# Patient Record
Sex: Female | Born: 1955 | Race: Black or African American | Hispanic: No | Marital: Married | Smoking: Never smoker
Health system: Southern US, Community
[De-identification: ages and names within clinical notes are randomized; demographics above are authoritative.]

## PROBLEM LIST (undated history)

## (undated) DIAGNOSIS — I10 Essential (primary) hypertension: Secondary | ICD-10-CM

## (undated) DIAGNOSIS — I4891 Unspecified atrial fibrillation: Secondary | ICD-10-CM

## (undated) DIAGNOSIS — R0600 Dyspnea, unspecified: Secondary | ICD-10-CM

## (undated) DIAGNOSIS — N189 Chronic kidney disease, unspecified: Secondary | ICD-10-CM

## (undated) DIAGNOSIS — U071 COVID-19: Secondary | ICD-10-CM

## (undated) DIAGNOSIS — D649 Anemia, unspecified: Secondary | ICD-10-CM

## (undated) DIAGNOSIS — M1990 Unspecified osteoarthritis, unspecified site: Secondary | ICD-10-CM

## (undated) DIAGNOSIS — G4730 Sleep apnea, unspecified: Secondary | ICD-10-CM

## (undated) DIAGNOSIS — J189 Pneumonia, unspecified organism: Secondary | ICD-10-CM

## (undated) DIAGNOSIS — Z9981 Dependence on supplemental oxygen: Secondary | ICD-10-CM

## (undated) DIAGNOSIS — I5030 Unspecified diastolic (congestive) heart failure: Secondary | ICD-10-CM

## (undated) DIAGNOSIS — D696 Thrombocytopenia, unspecified: Secondary | ICD-10-CM

## (undated) DIAGNOSIS — J9611 Chronic respiratory failure with hypoxia: Secondary | ICD-10-CM

## (undated) DIAGNOSIS — E119 Type 2 diabetes mellitus without complications: Secondary | ICD-10-CM

## (undated) DIAGNOSIS — K922 Gastrointestinal hemorrhage, unspecified: Secondary | ICD-10-CM

## (undated) DIAGNOSIS — M109 Gout, unspecified: Secondary | ICD-10-CM

## (undated) DIAGNOSIS — N183 Chronic kidney disease, stage 3 unspecified: Secondary | ICD-10-CM

## (undated) DIAGNOSIS — C801 Malignant (primary) neoplasm, unspecified: Secondary | ICD-10-CM

## (undated) DIAGNOSIS — Z9989 Dependence on other enabling machines and devices: Secondary | ICD-10-CM

---

## 1998-04-06 ENCOUNTER — Other Ambulatory Visit: Admission: RE | Admit: 1998-04-06 | Discharge: 1998-04-06 | Payer: Self-pay | Admitting: Obstetrics and Gynecology

## 2000-07-07 ENCOUNTER — Encounter: Admission: RE | Admit: 2000-07-07 | Discharge: 2000-07-07 | Payer: Self-pay | Admitting: Obstetrics and Gynecology

## 2000-07-07 ENCOUNTER — Encounter: Payer: Self-pay | Admitting: Obstetrics and Gynecology

## 2000-07-07 ENCOUNTER — Encounter: Payer: Self-pay | Admitting: Oncology

## 2000-07-07 ENCOUNTER — Encounter: Admission: RE | Admit: 2000-07-07 | Discharge: 2000-07-07 | Payer: Self-pay | Admitting: Oncology

## 2001-06-25 ENCOUNTER — Ambulatory Visit (HOSPITAL_COMMUNITY): Admission: RE | Admit: 2001-06-25 | Discharge: 2001-06-25 | Payer: Self-pay | Admitting: Obstetrics and Gynecology

## 2001-06-25 ENCOUNTER — Encounter: Payer: Self-pay | Admitting: Obstetrics and Gynecology

## 2001-06-25 ENCOUNTER — Encounter (INDEPENDENT_AMBULATORY_CARE_PROVIDER_SITE_OTHER): Payer: Self-pay | Admitting: *Deleted

## 2001-07-09 ENCOUNTER — Encounter: Admission: RE | Admit: 2001-07-09 | Discharge: 2001-07-09 | Payer: Self-pay | Admitting: Oncology

## 2001-07-09 ENCOUNTER — Encounter: Payer: Self-pay | Admitting: Oncology

## 2002-07-12 ENCOUNTER — Encounter: Payer: Self-pay | Admitting: Oncology

## 2002-07-12 ENCOUNTER — Encounter: Admission: RE | Admit: 2002-07-12 | Discharge: 2002-07-12 | Payer: Self-pay | Admitting: Oncology

## 2003-04-08 ENCOUNTER — Ambulatory Visit (HOSPITAL_COMMUNITY): Admission: RE | Admit: 2003-04-08 | Discharge: 2003-04-08 | Payer: Self-pay | Admitting: Gastroenterology

## 2003-04-08 ENCOUNTER — Encounter (INDEPENDENT_AMBULATORY_CARE_PROVIDER_SITE_OTHER): Payer: Self-pay | Admitting: Specialist

## 2003-07-15 ENCOUNTER — Encounter: Payer: Self-pay | Admitting: Oncology

## 2003-07-15 ENCOUNTER — Encounter: Admission: RE | Admit: 2003-07-15 | Discharge: 2003-07-15 | Payer: Self-pay | Admitting: Oncology

## 2004-07-16 ENCOUNTER — Encounter: Admission: RE | Admit: 2004-07-16 | Discharge: 2004-07-16 | Payer: Self-pay | Admitting: Obstetrics and Gynecology

## 2005-07-18 ENCOUNTER — Encounter: Admission: RE | Admit: 2005-07-18 | Discharge: 2005-07-18 | Payer: Self-pay | Admitting: Internal Medicine

## 2005-07-23 ENCOUNTER — Encounter: Admission: RE | Admit: 2005-07-23 | Discharge: 2005-07-23 | Payer: Self-pay | Admitting: Internal Medicine

## 2006-07-24 ENCOUNTER — Encounter: Admission: RE | Admit: 2006-07-24 | Discharge: 2006-07-24 | Payer: Self-pay | Admitting: Internal Medicine

## 2007-07-27 ENCOUNTER — Encounter: Admission: RE | Admit: 2007-07-27 | Discharge: 2007-07-27 | Payer: Self-pay | Admitting: Internal Medicine

## 2008-07-29 ENCOUNTER — Encounter: Admission: RE | Admit: 2008-07-29 | Discharge: 2008-07-29 | Payer: Self-pay | Admitting: Internal Medicine

## 2009-07-31 ENCOUNTER — Encounter: Admission: RE | Admit: 2009-07-31 | Discharge: 2009-07-31 | Payer: Self-pay | Admitting: Internal Medicine

## 2010-08-01 ENCOUNTER — Encounter: Admission: RE | Admit: 2010-08-01 | Discharge: 2010-08-01 | Payer: Self-pay | Admitting: Internal Medicine

## 2011-04-26 NOTE — Op Note (Signed)
[facility]  Patient:    Rita [name], Rita [name]                  MRN: [id number] Proc. Date: 06/25/01 Adm. Date:  [id number] Attending:  Aram Beecham                           Operative Report  PREOPERATIVE DIAGNOSIS:  Endometrial polyp.  POSTOPERATIVE DIAGNOSIS:  Build up of endometrium.  OPERATION:  Hysteroscopy with endometrial sampling.  SURGEON:  Daniel L. Cherylann Banas, M.D.  ANESTHESIA:  General.  INDICATIONS:  The patient is a 55 year old, gravida 4, para 4, ab 0, who is postmenopausal, status post treatment for breast cancer, who did not have any estrogen replacement therapy.  She was ultrasounded to evaluate her pelvis and at that time, an endometrial polyp was found.  She enters the hospital now for hysteroscopy and excision of the above.  FINDINGS:  External and vaginal is within normal limits.  Cervix is clean. Uterus appears to be slightly enlarged.  It is a difficult exam because of the patients obesity.  No adnexal masses are appreciated.  The intrauterine cavity is enlarged and sounds to 4.25 cm.  At the time of hysteroscopy, the patient did not have a well-defined polyp, but there was a significant build up of endometrium which was not appropriate based on the above findings. There was less than first-degree uterine descensus.  DESCRIPTION OF PROCEDURE:  After adequate general endotracheal anesthesia, the patient was placed in the dorsal lithotomy position, prepped and draped in the usual sterile manner.  A single-tooth tenaculum was placed in the anterior lip of the cervix, and the cervix was dilated to a #35 Pratt dilator.  A hysteroscopic examination was then done using 3% sorbitol to expand the intrauterine cavity, using a camera for magnification.  The above findings were noted.  Using the wire loop, areas of endometrial build-up were scraped away from the myometrium; then a very vigorous sharp curettage was  done throughout, and then the patient was rehysteroscoped to be sure that adequate sampling had been obtained, and it was.  All of the above tissue was sent. One of the areas looked polypoid, and this was sent separately.  Blood loss was less than 50 cc.  Fluid deficit was 200 cc.  The patient tolerated the procedure well and left the operating room in satisfactory condition. DD:  06/25/01 TD:  06/26/01 Job: [id number]

## 2011-04-26 NOTE — Op Note (Signed)
   NAME:  Rita [name], Rita [name]                     ACCOUNT NO.:  [id number]   MEDICAL RECORD NO.:  [id number]                   PATIENT TYPE:  AMB   LOCATION:  ENDO                                 FACILITY:  [facility]   PHYSICIAN:  Jeryl Columbia, M.D.                 DATE OF BIRTH:  [birth year]   DATE OF PROCEDURE:  04/08/2003  DATE OF DISCHARGE:                                 OPERATIVE REPORT   PROCEDURE:  Colonoscopy with polypectomy.   INDICATION:  Family history of colon cancer.  Personal history of colon  polyps.  Due for a repeat screening.  Consent was signed after risks,  benefits, methods, options thoroughly discussed multiple times in the past.   MEDICINES USED:  1. Demerol 70.  2. Versed 8.   DESCRIPTION OF PROCEDURE:  Rectal inspection was pertinent for external  hemorrhoids, small.  Digital exam was negative.  The regular video  colonoscope was inserted, easily advanced around the colon to the cecum.  This did require some abdominal pressure but no position changes.  No  obvious abnormality was seen on insertion.  The cecum was identified by the  appendiceal orifice and the ileocecal valve.  The scope was slowly  withdrawn.  The prep was adequate.  There was some liquid stool that  required washing and suctioning.  On slow withdrawal through the colon, in  the hepatic flexure along a fold, questionable tiny polyp was seen and was  hot biopsied x 1.  Other than that, no abnormalities were seen as we slowly  withdrew back to the rectum.  Specifically, no other polyps, masses,  diverticula.  Once back in the rectum, the scope was retroflexed, pertinet  for some internal hemorrhoids.  The scope was straightened and readvanced a  short ways up the left side of the colon; air was suctioned and scope  removed.  The patient tolerated the procedure well.  There was no obvious  immediate complication.   ENDOSCOPIC DIAGNOSES:  1. Internal-external hemorrhoids, small.  2. Tiny  hepatic flexure polyp, hot biopsied.  3. Otherwise, within normal limits to the cecum.    PLAN:  1. Yearly rectals and guaiacs per Dr. Bea Graff.  2. Happy to see back p.r.n.  3. Pending pathology, probably recheck colon screening in five years.                                               Jeryl Columbia, M.D.    MEM/MEDQ  D:  04/08/2003  T:  04/08/2003  Job:  [id number]   cc:   Dr. Wilmon Arms Granjeno, Clark Beryle Beams, M.D.  [address]. [place] [address]  Fax: [phone]

## 2011-06-27 ENCOUNTER — Other Ambulatory Visit: Payer: Self-pay | Admitting: Internal Medicine

## 2011-06-27 DIAGNOSIS — Z1231 Encounter for screening mammogram for malignant neoplasm of breast: Secondary | ICD-10-CM

## 2011-08-05 ENCOUNTER — Ambulatory Visit
Admission: RE | Admit: 2011-08-05 | Discharge: 2011-08-05 | Disposition: A | Discharge status: Home / Self Care | Payer: BC Managed Care – PPO | Source: Ambulatory Visit | Attending: Internal Medicine | Admitting: Internal Medicine

## 2011-08-05 DIAGNOSIS — Z1231 Encounter for screening mammogram for malignant neoplasm of breast: Secondary | ICD-10-CM

## 2012-08-04 ENCOUNTER — Other Ambulatory Visit: Payer: Self-pay | Admitting: Internal Medicine

## 2012-08-04 DIAGNOSIS — Z1231 Encounter for screening mammogram for malignant neoplasm of breast: Secondary | ICD-10-CM

## 2012-08-20 ENCOUNTER — Ambulatory Visit
Admission: RE | Admit: 2012-08-20 | Discharge: 2012-08-20 | Disposition: A | Discharge status: Home / Self Care | Payer: BC Managed Care – PPO | Source: Ambulatory Visit | Attending: Internal Medicine | Admitting: Internal Medicine

## 2012-08-20 DIAGNOSIS — Z1231 Encounter for screening mammogram for malignant neoplasm of breast: Secondary | ICD-10-CM

## 2013-08-31 ENCOUNTER — Other Ambulatory Visit: Payer: Self-pay

## 2013-08-31 DIAGNOSIS — Z9889 Other specified postprocedural states: Secondary | ICD-10-CM

## 2013-08-31 DIAGNOSIS — Z853 Personal history of malignant neoplasm of breast: Secondary | ICD-10-CM

## 2013-08-31 DIAGNOSIS — Z1231 Encounter for screening mammogram for malignant neoplasm of breast: Secondary | ICD-10-CM

## 2013-09-13 ENCOUNTER — Ambulatory Visit
Admission: RE | Admit: 2013-09-13 | Discharge: 2013-09-13 | Disposition: A | Discharge status: Home / Self Care | Payer: BC Managed Care – PPO | Source: Ambulatory Visit

## 2013-09-13 DIAGNOSIS — Z853 Personal history of malignant neoplasm of breast: Secondary | ICD-10-CM

## 2013-09-13 DIAGNOSIS — Z9889 Other specified postprocedural states: Secondary | ICD-10-CM

## 2013-09-13 DIAGNOSIS — Z1231 Encounter for screening mammogram for malignant neoplasm of breast: Secondary | ICD-10-CM

## 2014-08-10 ENCOUNTER — Other Ambulatory Visit: Payer: Self-pay

## 2014-08-10 DIAGNOSIS — Z1231 Encounter for screening mammogram for malignant neoplasm of breast: Secondary | ICD-10-CM

## 2014-09-15 ENCOUNTER — Ambulatory Visit
Admission: RE | Admit: 2014-09-15 | Discharge: 2014-09-15 | Disposition: A | Discharge status: Home / Self Care | Payer: 59 | Source: Ambulatory Visit

## 2014-09-15 DIAGNOSIS — Z1231 Encounter for screening mammogram for malignant neoplasm of breast: Secondary | ICD-10-CM

## 2014-12-29 ENCOUNTER — Other Ambulatory Visit: Payer: Self-pay | Admitting: Specialist

## 2014-12-29 DIAGNOSIS — N644 Mastodynia: Secondary | ICD-10-CM

## 2015-01-06 ENCOUNTER — Ambulatory Visit
Admission: RE | Admit: 2015-01-06 | Discharge: 2015-01-06 | Disposition: A | Discharge status: Home / Self Care | Payer: No Typology Code available for payment source | Source: Ambulatory Visit | Attending: Specialist | Admitting: Specialist

## 2015-01-06 DIAGNOSIS — N644 Mastodynia: Secondary | ICD-10-CM

## 2015-11-06 ENCOUNTER — Other Ambulatory Visit: Payer: Self-pay

## 2015-11-06 DIAGNOSIS — Z853 Personal history of malignant neoplasm of breast: Secondary | ICD-10-CM

## 2015-11-06 DIAGNOSIS — Z1231 Encounter for screening mammogram for malignant neoplasm of breast: Secondary | ICD-10-CM

## 2015-12-12 ENCOUNTER — Ambulatory Visit
Admission: RE | Admit: 2015-12-12 | Discharge: 2015-12-12 | Disposition: A | Discharge status: Home / Self Care | Payer: Self-pay | Source: Ambulatory Visit

## 2015-12-12 ENCOUNTER — Ambulatory Visit: Payer: Self-pay

## 2015-12-12 DIAGNOSIS — Z1231 Encounter for screening mammogram for malignant neoplasm of breast: Secondary | ICD-10-CM

## 2015-12-12 DIAGNOSIS — Z853 Personal history of malignant neoplasm of breast: Secondary | ICD-10-CM

## 2016-08-08 DIAGNOSIS — E119 Type 2 diabetes mellitus without complications: Secondary | ICD-10-CM

## 2016-08-08 DIAGNOSIS — I4891 Unspecified atrial fibrillation: Secondary | ICD-10-CM

## 2016-08-08 DIAGNOSIS — I10 Essential (primary) hypertension: Secondary | ICD-10-CM

## 2016-10-29 DIAGNOSIS — I482 Chronic atrial fibrillation: Secondary | ICD-10-CM

## 2016-10-29 DIAGNOSIS — J181 Lobar pneumonia, unspecified organism: Secondary | ICD-10-CM

## 2016-10-29 DIAGNOSIS — I10 Essential (primary) hypertension: Secondary | ICD-10-CM

## 2016-10-29 DIAGNOSIS — J90 Pleural effusion, not elsewhere classified: Secondary | ICD-10-CM

## 2016-10-29 DIAGNOSIS — I5033 Acute on chronic diastolic (congestive) heart failure: Secondary | ICD-10-CM

## 2016-10-29 DIAGNOSIS — E119 Type 2 diabetes mellitus without complications: Secondary | ICD-10-CM

## 2016-10-30 DIAGNOSIS — J9601 Acute respiratory failure with hypoxia: Secondary | ICD-10-CM

## 2016-10-31 DIAGNOSIS — J181 Lobar pneumonia, unspecified organism: Secondary | ICD-10-CM

## 2016-10-31 DIAGNOSIS — I5033 Acute on chronic diastolic (congestive) heart failure: Secondary | ICD-10-CM

## 2016-10-31 DIAGNOSIS — I482 Chronic atrial fibrillation: Secondary | ICD-10-CM

## 2017-11-26 DIAGNOSIS — I10 Essential (primary) hypertension: Secondary | ICD-10-CM

## 2017-11-26 DIAGNOSIS — E119 Type 2 diabetes mellitus without complications: Secondary | ICD-10-CM

## 2017-11-26 DIAGNOSIS — I5033 Acute on chronic diastolic (congestive) heart failure: Secondary | ICD-10-CM

## 2017-11-26 DIAGNOSIS — I509 Heart failure, unspecified: Secondary | ICD-10-CM

## 2017-11-26 DIAGNOSIS — R0609 Other forms of dyspnea: Secondary | ICD-10-CM

## 2017-11-26 DIAGNOSIS — I482 Chronic atrial fibrillation: Secondary | ICD-10-CM

## 2017-11-27 DIAGNOSIS — I472 Ventricular tachycardia: Secondary | ICD-10-CM

## 2018-11-16 DIAGNOSIS — E1122 Type 2 diabetes mellitus with diabetic chronic kidney disease: Secondary | ICD-10-CM | POA: Diagnosis not present

## 2018-11-16 DIAGNOSIS — I129 Hypertensive chronic kidney disease with stage 1 through stage 4 chronic kidney disease, or unspecified chronic kidney disease: Secondary | ICD-10-CM | POA: Diagnosis not present

## 2018-11-16 DIAGNOSIS — Z23 Encounter for immunization: Secondary | ICD-10-CM | POA: Diagnosis not present

## 2018-11-16 DIAGNOSIS — I5022 Chronic systolic (congestive) heart failure: Secondary | ICD-10-CM | POA: Diagnosis not present

## 2018-11-16 DIAGNOSIS — I272 Pulmonary hypertension, unspecified: Secondary | ICD-10-CM | POA: Diagnosis not present

## 2018-11-16 DIAGNOSIS — I4819 Other persistent atrial fibrillation: Secondary | ICD-10-CM | POA: Diagnosis not present

## 2018-11-16 DIAGNOSIS — D631 Anemia in chronic kidney disease: Secondary | ICD-10-CM | POA: Diagnosis not present

## 2018-11-16 DIAGNOSIS — I130 Hypertensive heart and chronic kidney disease with heart failure and stage 1 through stage 4 chronic kidney disease, or unspecified chronic kidney disease: Secondary | ICD-10-CM | POA: Diagnosis not present

## 2018-11-16 DIAGNOSIS — N183 Chronic kidney disease, stage 3 (moderate): Secondary | ICD-10-CM | POA: Diagnosis not present

## 2018-11-16 DIAGNOSIS — M150 Primary generalized (osteo)arthritis: Secondary | ICD-10-CM | POA: Diagnosis not present

## 2018-11-16 DIAGNOSIS — E782 Mixed hyperlipidemia: Secondary | ICD-10-CM | POA: Diagnosis not present

## 2018-11-16 DIAGNOSIS — M1A00X0 Idiopathic chronic gout, unspecified site, without tophus (tophi): Secondary | ICD-10-CM | POA: Diagnosis not present

## 2018-12-17 DIAGNOSIS — M170 Bilateral primary osteoarthritis of knee: Secondary | ICD-10-CM | POA: Diagnosis not present

## 2019-01-13 DIAGNOSIS — R112 Nausea with vomiting, unspecified: Secondary | ICD-10-CM | POA: Diagnosis not present

## 2019-01-13 DIAGNOSIS — R1030 Lower abdominal pain, unspecified: Secondary | ICD-10-CM | POA: Diagnosis not present

## 2019-01-14 DIAGNOSIS — R1030 Lower abdominal pain, unspecified: Secondary | ICD-10-CM | POA: Diagnosis not present

## 2019-01-14 DIAGNOSIS — K449 Diaphragmatic hernia without obstruction or gangrene: Secondary | ICD-10-CM | POA: Diagnosis not present

## 2019-01-14 DIAGNOSIS — K429 Umbilical hernia without obstruction or gangrene: Secondary | ICD-10-CM | POA: Diagnosis not present

## 2019-01-18 DIAGNOSIS — E119 Type 2 diabetes mellitus without complications: Secondary | ICD-10-CM | POA: Diagnosis not present

## 2019-01-18 DIAGNOSIS — Z01818 Encounter for other preprocedural examination: Secondary | ICD-10-CM | POA: Diagnosis not present

## 2019-01-18 DIAGNOSIS — H268 Other specified cataract: Secondary | ICD-10-CM | POA: Diagnosis not present

## 2019-01-18 DIAGNOSIS — H25811 Combined forms of age-related cataract, right eye: Secondary | ICD-10-CM | POA: Diagnosis not present

## 2019-01-21 DIAGNOSIS — K429 Umbilical hernia without obstruction or gangrene: Secondary | ICD-10-CM | POA: Diagnosis not present

## 2019-01-29 DIAGNOSIS — D500 Iron deficiency anemia secondary to blood loss (chronic): Secondary | ICD-10-CM | POA: Diagnosis not present

## 2019-02-02 DIAGNOSIS — Z1212 Encounter for screening for malignant neoplasm of rectum: Secondary | ICD-10-CM | POA: Diagnosis not present

## 2019-02-02 DIAGNOSIS — R195 Other fecal abnormalities: Secondary | ICD-10-CM | POA: Diagnosis not present

## 2019-02-02 DIAGNOSIS — D500 Iron deficiency anemia secondary to blood loss (chronic): Secondary | ICD-10-CM | POA: Diagnosis not present

## 2019-02-02 DIAGNOSIS — K2901 Acute gastritis with bleeding: Secondary | ICD-10-CM | POA: Diagnosis not present

## 2019-02-07 HISTORY — PX: HERNIA REPAIR: SHX51

## 2019-02-08 DIAGNOSIS — Z79899 Other long term (current) drug therapy: Secondary | ICD-10-CM | POA: Diagnosis not present

## 2019-02-08 DIAGNOSIS — D649 Anemia, unspecified: Secondary | ICD-10-CM | POA: Diagnosis not present

## 2019-02-08 DIAGNOSIS — Z01810 Encounter for preprocedural cardiovascular examination: Secondary | ICD-10-CM | POA: Diagnosis not present

## 2019-02-08 DIAGNOSIS — Z7984 Long term (current) use of oral hypoglycemic drugs: Secondary | ICD-10-CM | POA: Diagnosis not present

## 2019-02-08 DIAGNOSIS — E119 Type 2 diabetes mellitus without complications: Secondary | ICD-10-CM | POA: Diagnosis not present

## 2019-02-08 DIAGNOSIS — Z86711 Personal history of pulmonary embolism: Secondary | ICD-10-CM | POA: Diagnosis not present

## 2019-02-08 DIAGNOSIS — I509 Heart failure, unspecified: Secondary | ICD-10-CM | POA: Diagnosis not present

## 2019-02-08 DIAGNOSIS — Z7901 Long term (current) use of anticoagulants: Secondary | ICD-10-CM | POA: Diagnosis not present

## 2019-02-08 DIAGNOSIS — G8918 Other acute postprocedural pain: Secondary | ICD-10-CM | POA: Diagnosis not present

## 2019-02-08 DIAGNOSIS — K429 Umbilical hernia without obstruction or gangrene: Secondary | ICD-10-CM | POA: Diagnosis not present

## 2019-02-08 DIAGNOSIS — I110 Hypertensive heart disease with heart failure: Secondary | ICD-10-CM | POA: Diagnosis not present

## 2019-02-08 DIAGNOSIS — Z01818 Encounter for other preprocedural examination: Secondary | ICD-10-CM

## 2019-02-08 DIAGNOSIS — J449 Chronic obstructive pulmonary disease, unspecified: Secondary | ICD-10-CM | POA: Diagnosis not present

## 2019-02-11 ENCOUNTER — Other Ambulatory Visit: Payer: Self-pay | Admitting: *Deleted

## 2019-02-11 NOTE — Patient Outreach (Signed)
[facility]) Care Management  02/11/2019  Rita [name] December 20, 1955 [id number]   Transition of Care Referral   Referral Date: 02/10/19 Referral Source: HTA IP discharge Date of Admission: 05/12/32 Diagnosis: umbilical hernia  Date of Discharge: Facility: [facility] on 02/09/19 Insurance: HTA   Outreach attempt # 1 Patient is able to verify HIPAA Reviewed and addressed Transitional of care referral with patient   Rita [name] reports she is feeling "better" She reports she has good support from her husband. He is "waiting on me hand and foot"  She reports he assists her to get up to walk from her bedroom to her kitchen twice a day She denies bleeding, swelling, drainage or redness  from incision site No home health was ordered  Told to take  stool softener and she reports having a good stool since d/x Rita [name] She reports " I had good nurses at [facility] and I appreciate everyone for their care"  Social: Rita [name] lives at home with her husband and is assist with all her care needs at this time She reports lots of support and visits from family, friends, her pastor and church members She denies concern with transportation to her medical appointments   Conditions HTN , Controlled DM type 2 :HDL, morbid obesity, atrial fibrillation, osteoarthritis of multiple joints pulmonary HTN, umbilical hernia, venous insufficiency,Anemia due to stage 3 CKD, chronic gout, CHF, GERD  Medications: She denies concerns with taking medications as prescribed, affording medications, side effects of medications and questions about medications does not get flu shots   Appointments: Has a f/.u with Dr  Corena Pilgrim on 02/17/19   Advance directives: She Denies need for assist with advance directives  Denies need for assist with changes to present advance directives    Consent: [facility] RN CM reviewed [facility] services with patient. Patient gave verbal consent for services. Advised patient that other  post discharge calls may occur to assess how the patient is doing following the recent hospitalization. Patient voiced understanding and was appreciative of f/u call.  Plan: [facility] RN CM will close case at this time as patient has been assessed and no needs identified/needs resolved.   Pt encouraged to return a call to [place] CM prn  [facility] RN CM sent a successful outreach letter as discussed with [facility] brochure enclosed for review   Kaheem Halleck L. Lavina Hamman, RN, BSN, [place] Management Care Coordinator Direct Number [phone] Mobile number [phone]  Main [facility] number [phone] Fax number [phone]

## 2019-02-18 DIAGNOSIS — Z6841 Body Mass Index (BMI) 40.0 and over, adult: Secondary | ICD-10-CM | POA: Diagnosis not present

## 2019-02-18 DIAGNOSIS — I5022 Chronic systolic (congestive) heart failure: Secondary | ICD-10-CM | POA: Diagnosis not present

## 2019-02-18 DIAGNOSIS — Z7901 Long term (current) use of anticoagulants: Secondary | ICD-10-CM | POA: Diagnosis not present

## 2019-02-18 DIAGNOSIS — Z7984 Long term (current) use of oral hypoglycemic drugs: Secondary | ICD-10-CM | POA: Diagnosis not present

## 2019-02-18 DIAGNOSIS — I130 Hypertensive heart and chronic kidney disease with heart failure and stage 1 through stage 4 chronic kidney disease, or unspecified chronic kidney disease: Secondary | ICD-10-CM | POA: Diagnosis not present

## 2019-02-18 DIAGNOSIS — E1122 Type 2 diabetes mellitus with diabetic chronic kidney disease: Secondary | ICD-10-CM | POA: Diagnosis not present

## 2019-02-18 DIAGNOSIS — N183 Chronic kidney disease, stage 3 (moderate): Secondary | ICD-10-CM | POA: Diagnosis not present

## 2019-04-12 DIAGNOSIS — D500 Iron deficiency anemia secondary to blood loss (chronic): Secondary | ICD-10-CM | POA: Diagnosis not present

## 2019-04-28 DIAGNOSIS — D500 Iron deficiency anemia secondary to blood loss (chronic): Secondary | ICD-10-CM | POA: Diagnosis not present

## 2019-05-06 DIAGNOSIS — H268 Other specified cataract: Secondary | ICD-10-CM | POA: Diagnosis not present

## 2019-05-06 DIAGNOSIS — H2512 Age-related nuclear cataract, left eye: Secondary | ICD-10-CM | POA: Diagnosis not present

## 2019-05-06 DIAGNOSIS — H25812 Combined forms of age-related cataract, left eye: Secondary | ICD-10-CM | POA: Diagnosis not present

## 2019-05-17 DIAGNOSIS — H25812 Combined forms of age-related cataract, left eye: Secondary | ICD-10-CM | POA: Diagnosis not present

## 2019-05-18 DIAGNOSIS — Z23 Encounter for immunization: Secondary | ICD-10-CM | POA: Diagnosis not present

## 2019-05-18 DIAGNOSIS — N183 Chronic kidney disease, stage 3 (moderate): Secondary | ICD-10-CM | POA: Diagnosis not present

## 2019-05-18 DIAGNOSIS — I129 Hypertensive chronic kidney disease with stage 1 through stage 4 chronic kidney disease, or unspecified chronic kidney disease: Secondary | ICD-10-CM | POA: Diagnosis not present

## 2019-05-18 DIAGNOSIS — I272 Pulmonary hypertension, unspecified: Secondary | ICD-10-CM | POA: Diagnosis not present

## 2019-05-18 DIAGNOSIS — Z7901 Long term (current) use of anticoagulants: Secondary | ICD-10-CM | POA: Diagnosis not present

## 2019-05-18 DIAGNOSIS — D631 Anemia in chronic kidney disease: Secondary | ICD-10-CM | POA: Diagnosis not present

## 2019-05-18 DIAGNOSIS — I4819 Other persistent atrial fibrillation: Secondary | ICD-10-CM | POA: Diagnosis not present

## 2019-05-18 DIAGNOSIS — I110 Hypertensive heart disease with heart failure: Secondary | ICD-10-CM | POA: Diagnosis not present

## 2019-05-18 DIAGNOSIS — E1122 Type 2 diabetes mellitus with diabetic chronic kidney disease: Secondary | ICD-10-CM | POA: Diagnosis not present

## 2019-05-18 DIAGNOSIS — Z0000 Encounter for general adult medical examination without abnormal findings: Secondary | ICD-10-CM | POA: Diagnosis not present

## 2019-05-18 DIAGNOSIS — J9611 Chronic respiratory failure with hypoxia: Secondary | ICD-10-CM | POA: Diagnosis not present

## 2019-05-18 DIAGNOSIS — Z6841 Body Mass Index (BMI) 40.0 and over, adult: Secondary | ICD-10-CM | POA: Diagnosis not present

## 2019-05-18 DIAGNOSIS — I5022 Chronic systolic (congestive) heart failure: Secondary | ICD-10-CM | POA: Diagnosis not present

## 2019-05-24 DIAGNOSIS — D500 Iron deficiency anemia secondary to blood loss (chronic): Secondary | ICD-10-CM | POA: Diagnosis not present

## 2019-05-27 DIAGNOSIS — D500 Iron deficiency anemia secondary to blood loss (chronic): Secondary | ICD-10-CM | POA: Diagnosis not present

## 2019-05-31 DIAGNOSIS — Z1231 Encounter for screening mammogram for malignant neoplasm of breast: Secondary | ICD-10-CM | POA: Diagnosis not present

## 2019-06-08 DIAGNOSIS — I4819 Other persistent atrial fibrillation: Secondary | ICD-10-CM | POA: Diagnosis not present

## 2019-06-08 DIAGNOSIS — Z6841 Body Mass Index (BMI) 40.0 and over, adult: Secondary | ICD-10-CM | POA: Diagnosis not present

## 2019-06-08 DIAGNOSIS — I872 Venous insufficiency (chronic) (peripheral): Secondary | ICD-10-CM | POA: Diagnosis not present

## 2019-06-08 DIAGNOSIS — Z7901 Long term (current) use of anticoagulants: Secondary | ICD-10-CM | POA: Diagnosis not present

## 2019-06-08 DIAGNOSIS — I10 Essential (primary) hypertension: Secondary | ICD-10-CM | POA: Diagnosis not present

## 2019-06-08 DIAGNOSIS — I272 Pulmonary hypertension, unspecified: Secondary | ICD-10-CM | POA: Diagnosis not present

## 2019-06-08 DIAGNOSIS — E782 Mixed hyperlipidemia: Secondary | ICD-10-CM | POA: Diagnosis not present

## 2019-06-09 DIAGNOSIS — D500 Iron deficiency anemia secondary to blood loss (chronic): Secondary | ICD-10-CM | POA: Diagnosis not present

## 2019-06-22 DIAGNOSIS — M1711 Unilateral primary osteoarthritis, right knee: Secondary | ICD-10-CM | POA: Diagnosis not present

## 2019-07-02 DIAGNOSIS — M1712 Unilateral primary osteoarthritis, left knee: Secondary | ICD-10-CM | POA: Diagnosis not present

## 2019-07-06 DIAGNOSIS — R399 Unspecified symptoms and signs involving the genitourinary system: Secondary | ICD-10-CM | POA: Diagnosis not present

## 2019-07-06 DIAGNOSIS — N3001 Acute cystitis with hematuria: Secondary | ICD-10-CM | POA: Diagnosis not present

## 2019-07-06 DIAGNOSIS — N939 Abnormal uterine and vaginal bleeding, unspecified: Secondary | ICD-10-CM | POA: Diagnosis not present

## 2019-09-09 DIAGNOSIS — Z7901 Long term (current) use of anticoagulants: Secondary | ICD-10-CM | POA: Diagnosis not present

## 2019-09-09 DIAGNOSIS — J9611 Chronic respiratory failure with hypoxia: Secondary | ICD-10-CM | POA: Diagnosis not present

## 2019-09-09 DIAGNOSIS — Z9889 Other specified postprocedural states: Secondary | ICD-10-CM | POA: Diagnosis not present

## 2019-09-09 DIAGNOSIS — I130 Hypertensive heart and chronic kidney disease with heart failure and stage 1 through stage 4 chronic kidney disease, or unspecified chronic kidney disease: Secondary | ICD-10-CM | POA: Diagnosis not present

## 2019-09-09 DIAGNOSIS — N183 Chronic kidney disease, stage 3 unspecified: Secondary | ICD-10-CM | POA: Diagnosis not present

## 2019-09-09 DIAGNOSIS — I5022 Chronic systolic (congestive) heart failure: Secondary | ICD-10-CM | POA: Diagnosis not present

## 2019-09-09 DIAGNOSIS — H538 Other visual disturbances: Secondary | ICD-10-CM | POA: Diagnosis not present

## 2019-09-09 DIAGNOSIS — I4819 Other persistent atrial fibrillation: Secondary | ICD-10-CM | POA: Diagnosis not present

## 2019-09-09 DIAGNOSIS — K5904 Chronic idiopathic constipation: Secondary | ICD-10-CM | POA: Diagnosis not present

## 2019-09-09 DIAGNOSIS — R109 Unspecified abdominal pain: Secondary | ICD-10-CM | POA: Diagnosis not present

## 2019-09-09 DIAGNOSIS — E1122 Type 2 diabetes mellitus with diabetic chronic kidney disease: Secondary | ICD-10-CM | POA: Diagnosis not present

## 2019-09-13 DIAGNOSIS — D500 Iron deficiency anemia secondary to blood loss (chronic): Secondary | ICD-10-CM | POA: Diagnosis not present

## 2019-09-16 DIAGNOSIS — Z1212 Encounter for screening for malignant neoplasm of rectum: Secondary | ICD-10-CM | POA: Diagnosis not present

## 2019-09-16 DIAGNOSIS — R195 Other fecal abnormalities: Secondary | ICD-10-CM | POA: Diagnosis not present

## 2019-09-16 DIAGNOSIS — D500 Iron deficiency anemia secondary to blood loss (chronic): Secondary | ICD-10-CM | POA: Diagnosis not present

## 2019-09-16 DIAGNOSIS — K2901 Acute gastritis with bleeding: Secondary | ICD-10-CM | POA: Diagnosis not present

## 2019-09-20 DIAGNOSIS — I130 Hypertensive heart and chronic kidney disease with heart failure and stage 1 through stage 4 chronic kidney disease, or unspecified chronic kidney disease: Secondary | ICD-10-CM | POA: Diagnosis not present

## 2019-09-20 DIAGNOSIS — I4819 Other persistent atrial fibrillation: Secondary | ICD-10-CM | POA: Diagnosis not present

## 2019-09-20 DIAGNOSIS — J9611 Chronic respiratory failure with hypoxia: Secondary | ICD-10-CM | POA: Diagnosis not present

## 2019-09-20 DIAGNOSIS — D631 Anemia in chronic kidney disease: Secondary | ICD-10-CM | POA: Diagnosis not present

## 2019-09-20 DIAGNOSIS — I272 Pulmonary hypertension, unspecified: Secondary | ICD-10-CM | POA: Diagnosis not present

## 2019-09-20 DIAGNOSIS — N183 Chronic kidney disease, stage 3 unspecified: Secondary | ICD-10-CM | POA: Diagnosis not present

## 2019-09-20 DIAGNOSIS — I5022 Chronic systolic (congestive) heart failure: Secondary | ICD-10-CM | POA: Diagnosis not present

## 2019-10-05 DIAGNOSIS — J9611 Chronic respiratory failure with hypoxia: Secondary | ICD-10-CM | POA: Diagnosis not present

## 2019-10-05 DIAGNOSIS — R0600 Dyspnea, unspecified: Secondary | ICD-10-CM | POA: Diagnosis not present

## 2019-10-13 DIAGNOSIS — I509 Heart failure, unspecified: Secondary | ICD-10-CM | POA: Diagnosis not present

## 2019-10-19 DIAGNOSIS — D509 Iron deficiency anemia, unspecified: Secondary | ICD-10-CM | POA: Diagnosis not present

## 2019-10-19 DIAGNOSIS — D500 Iron deficiency anemia secondary to blood loss (chronic): Secondary | ICD-10-CM | POA: Diagnosis not present

## 2019-10-26 DIAGNOSIS — K2901 Acute gastritis with bleeding: Secondary | ICD-10-CM | POA: Diagnosis not present

## 2019-10-26 DIAGNOSIS — D500 Iron deficiency anemia secondary to blood loss (chronic): Secondary | ICD-10-CM | POA: Diagnosis not present

## 2019-10-26 DIAGNOSIS — R195 Other fecal abnormalities: Secondary | ICD-10-CM | POA: Diagnosis not present

## 2019-10-27 DIAGNOSIS — K2970 Gastritis, unspecified, without bleeding: Secondary | ICD-10-CM | POA: Diagnosis not present

## 2019-10-27 DIAGNOSIS — R195 Other fecal abnormalities: Secondary | ICD-10-CM | POA: Diagnosis not present

## 2019-10-27 DIAGNOSIS — D500 Iron deficiency anemia secondary to blood loss (chronic): Secondary | ICD-10-CM | POA: Diagnosis not present

## 2019-10-27 DIAGNOSIS — K2901 Acute gastritis with bleeding: Secondary | ICD-10-CM | POA: Diagnosis not present

## 2019-11-10 DIAGNOSIS — I130 Hypertensive heart and chronic kidney disease with heart failure and stage 1 through stage 4 chronic kidney disease, or unspecified chronic kidney disease: Secondary | ICD-10-CM | POA: Diagnosis not present

## 2019-11-10 DIAGNOSIS — Z79899 Other long term (current) drug therapy: Secondary | ICD-10-CM | POA: Diagnosis not present

## 2019-11-10 DIAGNOSIS — N183 Chronic kidney disease, stage 3 unspecified: Secondary | ICD-10-CM | POA: Diagnosis not present

## 2019-11-10 DIAGNOSIS — R05 Cough: Secondary | ICD-10-CM | POA: Diagnosis not present

## 2019-11-10 DIAGNOSIS — Z23 Encounter for immunization: Secondary | ICD-10-CM | POA: Diagnosis not present

## 2019-11-10 DIAGNOSIS — Z7901 Long term (current) use of anticoagulants: Secondary | ICD-10-CM | POA: Diagnosis not present

## 2019-11-10 DIAGNOSIS — J9611 Chronic respiratory failure with hypoxia: Secondary | ICD-10-CM | POA: Diagnosis not present

## 2019-11-10 DIAGNOSIS — D631 Anemia in chronic kidney disease: Secondary | ICD-10-CM | POA: Diagnosis not present

## 2019-11-10 DIAGNOSIS — I4819 Other persistent atrial fibrillation: Secondary | ICD-10-CM | POA: Diagnosis not present

## 2019-11-10 DIAGNOSIS — E611 Iron deficiency: Secondary | ICD-10-CM | POA: Diagnosis not present

## 2019-11-10 DIAGNOSIS — I272 Pulmonary hypertension, unspecified: Secondary | ICD-10-CM | POA: Diagnosis not present

## 2019-11-10 DIAGNOSIS — I5022 Chronic systolic (congestive) heart failure: Secondary | ICD-10-CM | POA: Diagnosis not present

## 2019-11-10 DIAGNOSIS — E1122 Type 2 diabetes mellitus with diabetic chronic kidney disease: Secondary | ICD-10-CM | POA: Diagnosis not present

## 2019-11-12 DIAGNOSIS — I509 Heart failure, unspecified: Secondary | ICD-10-CM | POA: Diagnosis not present

## 2019-12-06 DIAGNOSIS — D500 Iron deficiency anemia secondary to blood loss (chronic): Secondary | ICD-10-CM | POA: Diagnosis not present

## 2019-12-09 DIAGNOSIS — Z8719 Personal history of other diseases of the digestive system: Secondary | ICD-10-CM | POA: Diagnosis not present

## 2019-12-09 DIAGNOSIS — D500 Iron deficiency anemia secondary to blood loss (chronic): Secondary | ICD-10-CM | POA: Diagnosis not present

## 2019-12-09 DIAGNOSIS — R195 Other fecal abnormalities: Secondary | ICD-10-CM | POA: Diagnosis not present

## 2019-12-13 DIAGNOSIS — I509 Heart failure, unspecified: Secondary | ICD-10-CM | POA: Diagnosis not present

## 2019-12-30 ENCOUNTER — Emergency Department (HOSPITAL_COMMUNITY): Payer: PPO

## 2019-12-30 ENCOUNTER — Inpatient Hospital Stay (HOSPITAL_COMMUNITY)
Admission: EM | Admit: 2019-12-30 | Discharge: 2020-01-02 | DRG: 177 | Disposition: A | Discharge status: Home Health | Payer: PPO | Attending: Internal Medicine | Admitting: Internal Medicine

## 2019-12-30 ENCOUNTER — Encounter (HOSPITAL_COMMUNITY): Payer: Self-pay | Admitting: Emergency Medicine

## 2019-12-30 ENCOUNTER — Other Ambulatory Visit: Payer: Self-pay

## 2019-12-30 DIAGNOSIS — I10 Essential (primary) hypertension: Secondary | ICD-10-CM | POA: Diagnosis not present

## 2019-12-30 DIAGNOSIS — Z6841 Body Mass Index (BMI) 40.0 and over, adult: Secondary | ICD-10-CM

## 2019-12-30 DIAGNOSIS — Z881 Allergy status to other antibiotic agents status: Secondary | ICD-10-CM | POA: Diagnosis not present

## 2019-12-30 DIAGNOSIS — E1159 Type 2 diabetes mellitus with other circulatory complications: Secondary | ICD-10-CM

## 2019-12-30 DIAGNOSIS — N1831 Chronic kidney disease, stage 3a: Secondary | ICD-10-CM | POA: Diagnosis present

## 2019-12-30 DIAGNOSIS — I152 Hypertension secondary to endocrine disorders: Secondary | ICD-10-CM | POA: Diagnosis not present

## 2019-12-30 DIAGNOSIS — Z885 Allergy status to narcotic agent status: Secondary | ICD-10-CM | POA: Diagnosis not present

## 2019-12-30 DIAGNOSIS — R05 Cough: Secondary | ICD-10-CM | POA: Diagnosis not present

## 2019-12-30 DIAGNOSIS — I4819 Other persistent atrial fibrillation: Secondary | ICD-10-CM

## 2019-12-30 DIAGNOSIS — R0602 Shortness of breath: Secondary | ICD-10-CM

## 2019-12-30 DIAGNOSIS — N189 Chronic kidney disease, unspecified: Secondary | ICD-10-CM

## 2019-12-30 DIAGNOSIS — I130 Hypertensive heart and chronic kidney disease with heart failure and stage 1 through stage 4 chronic kidney disease, or unspecified chronic kidney disease: Secondary | ICD-10-CM | POA: Diagnosis present

## 2019-12-30 DIAGNOSIS — J9621 Acute and chronic respiratory failure with hypoxia: Secondary | ICD-10-CM | POA: Diagnosis present

## 2019-12-30 DIAGNOSIS — J1282 Pneumonia due to coronavirus disease 2019: Secondary | ICD-10-CM | POA: Diagnosis present

## 2019-12-30 DIAGNOSIS — Z888 Allergy status to other drugs, medicaments and biological substances status: Secondary | ICD-10-CM | POA: Diagnosis not present

## 2019-12-30 DIAGNOSIS — Z9981 Dependence on supplemental oxygen: Secondary | ICD-10-CM | POA: Diagnosis not present

## 2019-12-30 DIAGNOSIS — E1169 Type 2 diabetes mellitus with other specified complication: Secondary | ICD-10-CM | POA: Diagnosis present

## 2019-12-30 DIAGNOSIS — E119 Type 2 diabetes mellitus without complications: Secondary | ICD-10-CM | POA: Diagnosis not present

## 2019-12-30 DIAGNOSIS — K219 Gastro-esophageal reflux disease without esophagitis: Secondary | ICD-10-CM | POA: Diagnosis not present

## 2019-12-30 DIAGNOSIS — I480 Paroxysmal atrial fibrillation: Secondary | ICD-10-CM | POA: Diagnosis not present

## 2019-12-30 DIAGNOSIS — E1122 Type 2 diabetes mellitus with diabetic chronic kidney disease: Secondary | ICD-10-CM | POA: Diagnosis not present

## 2019-12-30 DIAGNOSIS — R5381 Other malaise: Secondary | ICD-10-CM | POA: Diagnosis not present

## 2019-12-30 DIAGNOSIS — I5033 Acute on chronic diastolic (congestive) heart failure: Secondary | ICD-10-CM

## 2019-12-30 DIAGNOSIS — Z7901 Long term (current) use of anticoagulants: Secondary | ICD-10-CM | POA: Diagnosis not present

## 2019-12-30 DIAGNOSIS — U071 COVID-19: Secondary | ICD-10-CM

## 2019-12-30 DIAGNOSIS — N183 Chronic kidney disease, stage 3 unspecified: Secondary | ICD-10-CM

## 2019-12-30 DIAGNOSIS — R5383 Other fatigue: Secondary | ICD-10-CM | POA: Diagnosis not present

## 2019-12-30 DIAGNOSIS — I248 Other forms of acute ischemic heart disease: Secondary | ICD-10-CM | POA: Diagnosis not present

## 2019-12-30 DIAGNOSIS — J9601 Acute respiratory failure with hypoxia: Secondary | ICD-10-CM

## 2019-12-30 DIAGNOSIS — J9611 Chronic respiratory failure with hypoxia: Secondary | ICD-10-CM | POA: Diagnosis not present

## 2019-12-30 DIAGNOSIS — M791 Myalgia, unspecified site: Secondary | ICD-10-CM | POA: Diagnosis not present

## 2019-12-30 DIAGNOSIS — R042 Hemoptysis: Secondary | ICD-10-CM | POA: Diagnosis not present

## 2019-12-30 HISTORY — DX: Unspecified atrial fibrillation: I48.91

## 2019-12-30 HISTORY — DX: Type 2 diabetes mellitus without complications: E11.9

## 2019-12-30 HISTORY — DX: Chronic respiratory failure with hypoxia: Z99.81

## 2019-12-30 HISTORY — DX: Chronic respiratory failure with hypoxia: J96.11

## 2019-12-30 HISTORY — DX: Essential (primary) hypertension: I10

## 2019-12-30 HISTORY — DX: Acute respiratory failure with hypoxia: J96.01

## 2019-12-30 HISTORY — DX: Unspecified diastolic (congestive) heart failure: I50.30

## 2019-12-30 HISTORY — DX: COVID-19: U07.1

## 2019-12-30 LAB — COMPREHENSIVE METABOLIC PANEL
ALT: 26 U/L (ref 0–44)
AST: 48 U/L — ABNORMAL HIGH (ref 15–41)
Albumin: 3.6 g/dL (ref 3.5–5.0)
Alkaline Phosphatase: 83 U/L (ref 38–126)
Anion gap: 9 (ref 5–15)
BUN: 13 mg/dL (ref 8–23)
CO2: 29 mmol/L (ref 22–32)
Calcium: 8.4 mg/dL — ABNORMAL LOW (ref 8.9–10.3)
Chloride: 99 mmol/L (ref 98–111)
Creatinine, Ser: 1.3 mg/dL — ABNORMAL HIGH (ref 0.44–1.00)
GFR calc Af Amer: 50 mL/min — ABNORMAL LOW (ref >60)
GFR calc non Af Amer: 43 mL/min — ABNORMAL LOW (ref >60)
Glucose, Bld: 185 mg/dL — ABNORMAL HIGH (ref 70–99)
Potassium: 3.5 mmol/L (ref 3.5–5.1)
Sodium: 137 mmol/L (ref 135–145)
Total Bilirubin: 1.5 mg/dL — ABNORMAL HIGH (ref 0.3–1.2)
Total Protein: 7.9 g/dL (ref 6.5–8.1)

## 2019-12-30 LAB — LACTIC ACID, PLASMA: Lactic Acid, Venous: 1.1 mmol/L (ref 0.5–1.9)

## 2019-12-30 LAB — ABO/RH: ABO/RH(D): O POS

## 2019-12-30 LAB — CBC WITH DIFFERENTIAL/PLATELET
Abs Immature Granulocytes: 0.01 10*3/uL (ref 0.00–0.07)
Basophils Absolute: 0 10*3/uL (ref 0.0–0.1)
Basophils Relative: 0 %
Eosinophils Absolute: 0.2 10*3/uL (ref 0.0–0.5)
Eosinophils Relative: 5 %
HCT: 29.8 % — ABNORMAL LOW (ref 36.0–46.0)
Hemoglobin: 9.3 g/dL — ABNORMAL LOW (ref 12.0–15.0)
Immature Granulocytes: 0 %
Lymphocytes Relative: 22 %
Lymphs Abs: 0.9 10*3/uL (ref 0.7–4.0)
MCH: 26.1 pg (ref 26.0–34.0)
MCHC: 31.2 g/dL (ref 30.0–36.0)
MCV: 83.7 fL (ref 80.0–100.0)
Monocytes Absolute: 0.3 10*3/uL (ref 0.1–1.0)
Monocytes Relative: 8 %
Neutro Abs: 2.7 10*3/uL (ref 1.7–7.7)
Neutrophils Relative %: 65 %
Platelets: 141 10*3/uL — ABNORMAL LOW (ref 150–400)
RBC: 3.56 MIL/uL — ABNORMAL LOW (ref 3.87–5.11)
RDW: 16.6 % — ABNORMAL HIGH (ref 11.5–15.5)
WBC: 4.2 10*3/uL (ref 4.0–10.5)
nRBC: 0 % (ref 0.0–0.2)

## 2019-12-30 LAB — C-REACTIVE PROTEIN: CRP: 2.8 mg/dL — ABNORMAL HIGH (ref <1.0)

## 2019-12-30 LAB — RESPIRATORY PANEL BY RT PCR (FLU A&B, COVID)
Influenza A by PCR: NEGATIVE
Influenza B by PCR: NEGATIVE
SARS Coronavirus 2 by RT PCR: POSITIVE — AB

## 2019-12-30 LAB — PROTIME-INR
INR: 2.1 — ABNORMAL HIGH (ref 0.8–1.2)
Prothrombin Time: 23.8 seconds — ABNORMAL HIGH (ref 11.4–15.2)

## 2019-12-30 LAB — TROPONIN I (HIGH SENSITIVITY)
Troponin I (High Sensitivity): 302 ng/L (ref <18)
Troponin I (High Sensitivity): 314 ng/L (ref <18)

## 2019-12-30 LAB — FERRITIN: Ferritin: 298 ng/mL (ref 11–307)

## 2019-12-30 LAB — LACTATE DEHYDROGENASE: LDH: 348 U/L — ABNORMAL HIGH (ref 98–192)

## 2019-12-30 LAB — PROCALCITONIN: Procalcitonin: <0.1 ng/mL

## 2019-12-30 LAB — TYPE AND SCREEN
ABO/RH(D): O POS
Antibody Screen: NEGATIVE

## 2019-12-30 LAB — BRAIN NATRIURETIC PEPTIDE: B Natriuretic Peptide: 427 pg/mL — ABNORMAL HIGH (ref 0.0–100.0)

## 2019-12-30 LAB — D-DIMER, QUANTITATIVE: D-Dimer, Quant: 2.34 ug/mL-FEU — ABNORMAL HIGH (ref 0.00–0.50)

## 2019-12-30 LAB — HIV ANTIBODY (ROUTINE TESTING W REFLEX): HIV Screen 4th Generation wRfx: NONREACTIVE

## 2019-12-30 MED ORDER — ZINC SULFATE 220 (50 ZN) MG PO CAPS
220.0000 mg | ORAL_CAPSULE | Freq: Every day | ORAL | Status: DC
  Administered 2019-12-30 – 2020-01-02 (×4): 220 mg via ORAL
  Filled 2019-12-30 (×4): qty 1

## 2019-12-30 MED ORDER — ALBUTEROL SULFATE HFA 108 (90 BASE) MCG/ACT IN AERS
2.0000 | INHALATION_SPRAY | Freq: Four times a day (QID) | RESPIRATORY_TRACT | Status: DC
  Administered 2019-12-30 – 2020-01-02 (×9): 2 via RESPIRATORY_TRACT
  Filled 2019-12-30: qty 6.7

## 2019-12-30 MED ORDER — DEXAMETHASONE 6 MG PO TABS
6.0000 mg | ORAL_TABLET | ORAL | Status: DC
  Administered 2019-12-30 – 2019-12-31 (×2): 6 mg via ORAL
  Filled 2019-12-30: qty 1
  Filled 2019-12-30: qty 2

## 2019-12-30 MED ORDER — LISINOPRIL 10 MG PO TABS: 20.0000 mg | ORAL_TABLET | Freq: Every day | ORAL | Status: DC

## 2019-12-30 MED ORDER — ACETAMINOPHEN 325 MG PO TABS: 650.0000 mg | ORAL_TABLET | Freq: Four times a day (QID) | ORAL | Status: DC | PRN

## 2019-12-30 MED ORDER — CARVEDILOL 12.5 MG PO TABS
25.0000 mg | ORAL_TABLET | Freq: Two times a day (BID) | ORAL | Status: DC
  Administered 2019-12-31 – 2020-01-02 (×5): 25 mg via ORAL
  Filled 2019-12-30 (×5): qty 2

## 2019-12-30 MED ORDER — PANTOPRAZOLE SODIUM 40 MG PO TBEC
40.0000 mg | DELAYED_RELEASE_TABLET | Freq: Every day | ORAL | Status: DC
  Administered 2019-12-31 – 2020-01-02 (×3): 40 mg via ORAL
  Filled 2019-12-30 (×3): qty 1

## 2019-12-30 MED ORDER — HYDROCOD POLST-CPM POLST ER 10-8 MG/5ML PO SUER
5.0000 mL | Freq: Two times a day (BID) | ORAL | Status: DC | PRN
  Administered 2019-12-31: 5 mL via ORAL
  Filled 2019-12-30: qty 5

## 2019-12-30 MED ORDER — ONDANSETRON HCL 4 MG/2ML IJ SOLN: 4.0000 mg | Freq: Four times a day (QID) | INTRAMUSCULAR | Status: DC | PRN

## 2019-12-30 MED ORDER — ONDANSETRON HCL 4 MG PO TABS: 4.0000 mg | ORAL_TABLET | Freq: Four times a day (QID) | ORAL | Status: DC | PRN

## 2019-12-30 MED ORDER — POTASSIUM CHLORIDE CRYS ER 20 MEQ PO TBCR
20.0000 meq | EXTENDED_RELEASE_TABLET | Freq: Every day | ORAL | Status: DC
  Administered 2019-12-31: 11:00:00 20 meq via ORAL
  Filled 2019-12-30: qty 1

## 2019-12-30 MED ORDER — FUROSEMIDE 10 MG/ML IJ SOLN
40.0000 mg | Freq: Once | INTRAMUSCULAR | Status: AC
  Administered 2019-12-31: 05:00:00 40 mg via INTRAVENOUS
  Filled 2019-12-30: qty 4

## 2019-12-30 MED ORDER — ASCORBIC ACID 500 MG PO TABS
500.0000 mg | ORAL_TABLET | Freq: Every day | ORAL | Status: DC
  Administered 2019-12-30 – 2020-01-02 (×4): 500 mg via ORAL
  Filled 2019-12-30 (×4): qty 1

## 2019-12-30 MED ORDER — FUROSEMIDE 10 MG/ML IJ SOLN
40.0000 mg | Freq: Once | INTRAMUSCULAR | Status: AC
  Administered 2019-12-30: 19:00:00 40 mg via INTRAVENOUS
  Filled 2019-12-30: qty 4

## 2019-12-30 MED ORDER — RIVAROXABAN 20 MG PO TABS
20.0000 mg | ORAL_TABLET | Freq: Every day | ORAL | Status: DC
  Administered 2019-12-31 – 2020-01-01 (×2): 20 mg via ORAL
  Filled 2019-12-30 (×4): qty 1

## 2019-12-30 MED ORDER — SODIUM CHLORIDE 0.9 % IV SOLN: 100.0000 mg | Freq: Every day | INTRAVENOUS | Status: DC

## 2019-12-30 MED ORDER — GUAIFENESIN-DM 100-10 MG/5ML PO SYRP: 10.0000 mL | ORAL_SOLUTION | ORAL | Status: DC | PRN

## 2019-12-30 MED ORDER — SODIUM CHLORIDE 0.9 % IV SOLN
200.0000 mg | Freq: Once | INTRAVENOUS | Status: AC
  Administered 2019-12-31: 02:00:00 200 mg via INTRAVENOUS
  Filled 2019-12-30 (×2): qty 40

## 2019-12-30 NOTE — ED Provider Notes (Signed)
[facility] EMERGENCY DEPARTMENT Provider Note   CSN: [id number] Arrival date & time: 12/30/19  1729     History Chief Complaint  Patient presents with  . Weakness    Rita [name] is a 64 y.o. female.  64 year old female with prior medical history as detailed below presents for evaluation of shortness of breath.  Patient reports gradual onset over the last 2 to 3 weeks of worsening shortness of breath.  She is home O2 dependent.  She reports that she has had to increase her home O2 from approximately 2 L nasal cannula up to 4 to 5 L for comfort.  She denies associated chest pain or fever.  She does report worsening cough.  She reports some mild blood-tinged sputum earlier this week with heavy coughing.  She denies frank hematemesis or hemoptysis.  She denies bloody stools.  Patient was seen by her primary care provider today and referred to the ED for likely admission given her significant hypoxia. Patient does not keep track of her weights on a daily basis.  She does report increased edema to both lower extremities.  She reports full compliance with her regular medications including her diuretics.   The history is provided by the patient and medical records.  Shortness of Breath Severity:  Moderate Onset quality:  Gradual Duration:  2 weeks Timing:  Constant Progression:  Worsening Chronicity:  New Context: activity   Relieved by:  Nothing Worsened by:  Nothing Ineffective treatments:  Diuretics Associated symptoms: no chest pain, no fever and no hemoptysis        Past Medical History:  Diagnosis Date  . Diabetes mellitus without complication ([place])   . Hypertension     There are no problems to display for this patient.     OB History   No obstetric history on file.     No family history on file.  Social History   Tobacco Use  . Smoking status: Not on file  Substance Use Topics  . Alcohol use: Not on file  . Drug use: Not on  file    Home Medications Prior to Admission medications   Not on File    Allergies    Patient has no known allergies.  Review of Systems   Review of Systems  Constitutional: Negative for fever.  Respiratory: Positive for shortness of breath. Negative for hemoptysis.   Cardiovascular: Negative for chest pain.  All other systems reviewed and are negative.   Physical Exam Updated Vital Signs BP (!) 179/91 (BP Location: Right Arm)   Pulse 94   Temp 98.8 F (37.1 C) (Oral)   SpO2 93%   Physical Exam Vitals and nursing note reviewed.  Constitutional:      General: She is not in acute distress.    Appearance: Normal appearance. She is well-developed.  HENT:     Head: Normocephalic and atraumatic.  Eyes:     Conjunctiva/sclera: Conjunctivae normal.     Pupils: Pupils are equal, round, and reactive to light.  Cardiovascular:     Rate and Rhythm: Normal rate and regular rhythm.     Heart sounds: Normal heart sounds.  Pulmonary:     Effort: Pulmonary effort is normal. No respiratory distress.     Comments: Decreased BS at bilateral bases  Abdominal:     General: There is no distension.     Palpations: Abdomen is soft.     Tenderness: There is no abdominal tenderness.  Musculoskeletal:  General: No deformity. Normal range of motion.     Cervical back: Normal range of motion and neck supple.     Comments: 2 plus edema to BLE   Skin:    General: Skin is warm and dry.  Neurological:     General: No focal deficit present.     Mental Status: She is alert and oriented to person, place, and time.     ED Results / Procedures / Treatments   Labs (all labs ordered are listed, but only abnormal results are displayed) Labs Reviewed  RESPIRATORY PANEL BY RT PCR (FLU A&B, COVID) - Abnormal; Notable for the following components:      Result Value   SARS Coronavirus 2 by RT PCR POSITIVE (*)    All other components within normal limits  COMPREHENSIVE METABOLIC PANEL -  Abnormal; Notable for the following components:   Glucose, Bld 185 (*)    Creatinine, Ser 1.30 (*)    Calcium 8.4 (*)    AST 48 (*)    Total Bilirubin 1.5 (*)    GFR calc non Af Amer 43 (*)    GFR calc Af Amer 50 (*)    All other components within normal limits  CBC WITH DIFFERENTIAL/PLATELET - Abnormal; Notable for the following components:   RBC 3.56 (*)    Hemoglobin 9.3 (*)    HCT 29.8 (*)    RDW 16.6 (*)    Platelets 141 (*)    All other components within normal limits  PROTIME-INR - Abnormal; Notable for the following components:   Prothrombin Time 23.8 (*)    INR 2.1 (*)    All other components within normal limits  BRAIN NATRIURETIC PEPTIDE - Abnormal; Notable for the following components:   B Natriuretic Peptide 427.0 (*)    All other components within normal limits  TROPONIN I (HIGH SENSITIVITY) - Abnormal; Notable for the following components:   Troponin I (High Sensitivity) 302 (*)    All other components within normal limits  CULTURE, BLOOD (ROUTINE X 2)  CULTURE, BLOOD (ROUTINE X 2)  LACTIC ACID, PLASMA  TYPE AND SCREEN  ABO/RH  TROPONIN I (HIGH SENSITIVITY)    EKG EKG Interpretation  Date/Time:  Thursday December 30 2019 17:43:17 EST Ventricular Rate:  91 PR Interval:  210 QRS Duration: 108 QT Interval:  382 QTC Calculation: 469 R Axis:   141 Text Interpretation: Sinus rhythm with 1st degree A-V block Right axis deviation Pulmonary disease pattern Incomplete left bundle branch block Nonspecific ST and T wave abnormality Abnormal ECG Reconfirmed by Dene Gentry [phone]) on 12/30/2019 9:13:34 PM   Radiology DG Chest Port 1 View  Result Date: 12/30/2019 CLINICAL DATA:  Shortness of breath. EXAM: PORTABLE CHEST 1 VIEW COMPARISON:  November 26, 2017 FINDINGS: Moderate severity infiltrates are seen within bilateral lung bases, left slightly greater than right. Small bilateral pleural effusions are noted. There is no evidence of a pneumothorax. The cardiac  silhouette is moderately enlarged. Radiopaque surgical clips are seen overlying the left axilla. These are present on the prior exam. Degenerative changes seen within the thoracic spine. IMPRESSION: 1. Moderate severity bibasilar infiltrates, left slightly greater than right. 2. Small bilateral pleural effusions. Electronically Signed   By: Virgina Norfolk M.D.   On: 12/30/2019 18:10    Procedures Procedures (including critical care time)  Medications Ordered in ED Medications  furosemide (LASIX) injection 40 mg (has no administration in time range)    ED Course  I have reviewed the triage  vital signs and the nursing notes.  Pertinent labs & imaging results that were available during my care of the patient were reviewed by me and considered in my medical decision making (see chart for details).    MDM Rules/Calculators/A&P                      MDM  Screen complete  Korinn Haluska was evaluated in Emergency Department on 12/30/2019 for the symptoms described in the history of present illness. She was evaluated in the context of the global COVID-19 pandemic, which necessitated consideration that the patient might be at risk for infection with the SARS-CoV-2 virus that causes COVID-19. Institutional protocols and algorithms that pertain to the evaluation of patients at risk for COVID-19 are in a state of rapid change based on information released by regulatory bodies including the CDC and federal and state organizations. These policies and algorithms were followed during the patient's care in the ED.  Patient is presenting for evaluation of shortness of breath.  Patient's work-up suggest concurrent CHF exacerbation with COVID infection.  Patient will require admission.  Hospitalist service is aware of case and will evaluate for admission.        Final Clinical Impression(s) / ED Diagnoses Final diagnoses:  Shortness of breath    Rx / DC Orders ED Discharge Orders     None       Valarie Merino, MD 12/30/19 2120

## 2019-12-30 NOTE — ED Triage Notes (Signed)
Pt arrives stating she "feels yucky." Reports low iron and coughing up blood. O2 sats 82% on 6L, wears 2L all the time. Denies any CP

## 2019-12-30 NOTE — ED Notes (Addendum)
Pt stated that she is on 2 L O2 [place] at home. Pt O2 saturation upon arrival was 77%. Pt was asked if she has COPD and she stated, "Not that I'm aware of." Tech, Natalie, put the pt on 5L O2 but O2 saturation stayed at 77%. O2 was then changed to 7L of O2 and O2 saturation went to 93%. Pt is back in triage at this time.

## 2019-12-30 NOTE — H&P (Signed)
History and Physical    Rita [name] LFY:[id number] DOB: [birth year] DOA: 12/30/2019  PCP: [name], MD  Patient coming from: PCP office  I have personally briefly reviewed patient's old medical records in [place]  Chief Complaint: Hypoxia  HPI: Rita [name] is a 64 y.o. female with medical history significant for chronic diastolic CHF, chronic respiratory failure with hypoxia on 2-3 L supplemental O2 via [place], atrial fibrillation on Xarelto, CKD stage III, type 2 diabetes, hypertension who presents to the ED from her PCP office for evaluation of hypoxia.  Patient reports gradual progressive shortness of breath over the last 2 to 3 weeks.  She felt as if she had a cold coming on with a cough productive of occasional yellow sputum and at times blood-tinged sputum.  She denies any frank hematemesis or hemoptysis.  She has had associated chills but denies any subjective fevers or diaphoresis.  She has had nausea without emesis.  She has not had any chest pain, abdominal pain, diarrhea, dysuria, or worsening lower extremity edema compared to her baseline.  She went to her primary care provider today and was noted to have hypoxia with O2 in the 70s while at rest.  She was sent to the ED for further evaluation and management.  ED Course:  Initial vitals showed BP 187/95, pulse 93, RR 18, temp 98.8 Fahrenheit, SPO2 100% on 7 L supplemental O2 via [place].  Labs are notable for WBC 4.2, hemoglobin 9.3, platelets 141,000, sodium 137, potassium 3.5, bicarb 29, BUN 13, creatinine 1.3, serum glucose 185, AST 48, ALT 26, alk phos 83, total bilirubin 1.5, BNP 427, lactic acid 1.1, high-sensitivity troponin I 302 >> 314.  Blood cultures were obtained and pending.  SARS-CoV-2 PCR is positive.  Influenza a and B are negative.  Portable chest x-ray shows moderate bibasilar infiltrates and small bilateral pleural effusions.  Patient was given IV Lasix 40 mg x 1 and the hospitalist  service was consulted to admit for further evaluation and management.  Review of Systems: All systems reviewed and are negative except as documented in history of present illness above.   Past Medical History:  Diagnosis Date  . Atrial fibrillation ([place])   . Chronic respiratory failure with hypoxia, on home O2 therapy ([place])   . Diabetes mellitus without complication ([place])   . Diastolic CHF ([place])   . Hypertension     History reviewed. No pertinent surgical history.  Social History:  reports that she has never smoked. She has never used smokeless tobacco. She reports that she does not drink alcohol or use drugs.  Allergies  Allergen Reactions  . Codeine     Mouth sores   . Prednisone     Delusions   . Moxifloxacin Rash    Family History  Problem Relation Age of Onset  . Cancer Mother      Prior to Admission medications   Not on File    Physical Exam: Vitals:   12/30/19 2230 12/30/19 2245 12/30/19 2300 12/30/19 2315  BP: (!) 170/90 (!) 172/101 (!) 170/84 (!) 167/88  Pulse: 77 81 77 72  Resp: 20   20  Temp:      TempSrc:      SpO2: 96% 98% 98% 96%   Constitutional: Obese woman resting supine in bed, NAD, calm, comfortable Eyes: PERRL, lids and conjunctivae normal ENMT: Mucous membranes are moist. Posterior pharynx clear of any exudate or lesions.Normal dentition.  Neck: normal, supple, no masses. Respiratory: Coarse  and expiratory breath sounds bilaterally. Normal respiratory effort. No accessory muscle use.  Cardiovascular: Regular rate and rhythm, no murmurs / rubs / gallops.  Trace to +1 bilateral lower extremity edema. 2+ pedal pulses. Abdomen: no tenderness, no masses palpated. No hepatosplenomegaly. Bowel sounds positive.  Musculoskeletal: no clubbing / cyanosis. No joint deformity upper and lower extremities. Good ROM, no contractures. Normal muscle tone.  Skin: no rashes, lesions, ulcers. No induration Neurologic: CN 2-12 grossly intact. Sensation intact,  Strength 5/5 in all 4.  Psychiatric: Normal judgment and insight. Alert and oriented x 3. Normal mood.    Labs on Admission: I have personally reviewed following labs and imaging studies  CBC: Recent Labs  Lab 12/30/19 1800  WBC 4.2  NEUTROABS 2.7  HGB 9.3*  HCT 29.8*  MCV 83.7  PLT 846*   Basic Metabolic Panel: Recent Labs  Lab 12/30/19 1800  NA 137  K 3.5  CL 99  CO2 29  GLUCOSE 185*  BUN 13  CREATININE 1.30*  CALCIUM 8.4*   GFR: CrCl cannot be calculated (Unknown ideal weight.). Liver Function Tests: Recent Labs  Lab 12/30/19 1800  AST 48*  ALT 26  ALKPHOS 83  BILITOT 1.5*  PROT 7.9  ALBUMIN 3.6   No results for input(s): LIPASE, AMYLASE in the last 168 hours. No results for input(s): AMMONIA in the last 168 hours. Coagulation Profile: Recent Labs  Lab 12/30/19 1800  INR 2.1*   Cardiac Enzymes: No results for input(s): CKTOTAL, CKMB, CKMBINDEX, TROPONINI in the last 168 hours. BNP (last 3 results) No results for input(s): PROBNP in the last 8760 hours. HbA1C: No results for input(s): HGBA1C in the last 72 hours. CBG: No results for input(s): GLUCAP in the last 168 hours. Lipid Profile: No results for input(s): CHOL, HDL, LDLCALC, TRIG, CHOLHDL, LDLDIRECT in the last 72 hours. Thyroid Function Tests: No results for input(s): TSH, T4TOTAL, FREET4, T3FREE, THYROIDAB in the last 72 hours. Anemia Panel: Recent Labs    12/30/19 2147  FERRITIN 298   Urine analysis: No results found for: COLORURINE, APPEARANCEUR, LABSPEC, PHURINE, GLUCOSEU, HGBUR, BILIRUBINUR, KETONESUR, PROTEINUR, UROBILINOGEN, NITRITE, LEUKOCYTESUR  Radiological Exams on Admission: DG Chest Port 1 View  Result Date: 12/30/2019 CLINICAL DATA:  Shortness of breath. EXAM: PORTABLE CHEST 1 VIEW COMPARISON:  November 26, 2017 FINDINGS: Moderate severity infiltrates are seen within bilateral lung bases, left slightly greater than right. Small bilateral pleural effusions are noted.  There is no evidence of a pneumothorax. The cardiac silhouette is moderately enlarged. Radiopaque surgical clips are seen overlying the left axilla. These are present on the prior exam. Degenerative changes seen within the thoracic spine. IMPRESSION: 1. Moderate severity bibasilar infiltrates, left slightly greater than right. 2. Small bilateral pleural effusions. Electronically Signed   By: Virgina Norfolk M.D.   On: 12/30/2019 18:10    EKG: Independently reviewed. Sinus rhythm with first-degree AV block, RAD, incomplete LBBB, no prior for comparison.  Assessment/Plan Principal Problem:   Acute hypoxemic respiratory failure due to COVID-19 [facility]) Active Problems:   Diabetes mellitus without complication (HCC)   Acute on chronic diastolic CHF (congestive heart failure) (HCC)   AF (paroxysmal atrial fibrillation) (HCC)   CKD (chronic kidney disease), stage III   Hypertension associated with diabetes ([place])  Rita [name] is a 64 y.o. female with medical history significant for chronic diastolic CHF, chronic respiratory failure with hypoxia on 2-3 L supplemental O2 via [place], atrial fibrillation on Xarelto, CKD stage III, type 2 diabetes, hypertension who  is admitted with acute on chronic respiratory failure with hypoxia due to COVID-19 viral infection and acute on chronic diastolic CHF exacerbation.  Acute on chronic respiratory failure with hypoxia due to COVID-19 viral pneumonia: Initially increased O2 requirement from home 2-3 L to 7 L supplement O2 via [place].  Chest x-ray with bibasilar infiltrates. -SARS-CoV-2 PCR + 12/30/2019 -Admit to [facility] as currently off algorithm -Continue supplemental oxygen, wean down as able -Start IV remdesivir per pharmacy protocol -Start IV dexamethasone 6 mg daily -Incentive spirometer, flutter valve, albuterol inhaler -Antitussives, vitamin C, zinc  Acute on chronic diastolic CHF exacerbation: EF 55-60% by echocardiogram 10/27/2018.   Likely exacerbated in setting of COVID-19 viral infection.  Began to diurese well with initial IV Lasix.  Will give additional IV Lasix 40 mg once in the morning.  Monitor strict I/O's and daily weights.  Elevated troponin: Mildly elevated, likely demand ischemia in setting of acute on chronic diastolic CHF exacerbation and acute on chronic respiratory failure with hypoxia.  Will repeat troponin.  Paroxysmal atrial fibrillation: In sinus rhythm at time of admission.  Continue home Coreg and Xarelto.  CKD stage III: Chronic and appears stable.  Continue monitor.  Type 2 diabetes: Hold home oral meds, start sensitive SSI while in hospital and adjust as needed.  Hypertension: Resume home lisinopril, Coreg.  DVT prophylaxis: Xarelto Code Status: Full code, confirmed with patient Family Communication: Discussed with patient, she has discussed with her husband Disposition Plan: Admit to [place] called: None Admission status: Inpatient for management of acute on chronic respiratory failure with hypoxia due to COVID-19 viral infection and acute CHF exacerbation.   Zada Finders MD [facility]  If 7PM-7AM, please contact night-coverage [email or web address]  12/31/2019, 12:16 AM

## 2019-12-31 DIAGNOSIS — I480 Paroxysmal atrial fibrillation: Secondary | ICD-10-CM

## 2019-12-31 DIAGNOSIS — N1831 Chronic kidney disease, stage 3a: Secondary | ICD-10-CM

## 2019-12-31 DIAGNOSIS — I5033 Acute on chronic diastolic (congestive) heart failure: Secondary | ICD-10-CM

## 2019-12-31 LAB — TROPONIN I (HIGH SENSITIVITY): Troponin I (High Sensitivity): 243 ng/L (ref <18)

## 2019-12-31 LAB — COMPREHENSIVE METABOLIC PANEL
ALT: 24 U/L (ref 0–44)
AST: 45 U/L — ABNORMAL HIGH (ref 15–41)
Albumin: 3.7 g/dL (ref 3.5–5.0)
Alkaline Phosphatase: 81 U/L (ref 38–126)
Anion gap: 11 (ref 5–15)
BUN: 15 mg/dL (ref 8–23)
CO2: 30 mmol/L (ref 22–32)
Calcium: 8.5 mg/dL — ABNORMAL LOW (ref 8.9–10.3)
Chloride: 99 mmol/L (ref 98–111)
Creatinine, Ser: 1.03 mg/dL — ABNORMAL HIGH (ref 0.44–1.00)
GFR calc Af Amer: >60 mL/min (ref >60)
GFR calc non Af Amer: 57 mL/min — ABNORMAL LOW (ref >60)
Glucose, Bld: 230 mg/dL — ABNORMAL HIGH (ref 70–99)
Potassium: 3.7 mmol/L (ref 3.5–5.1)
Sodium: 140 mmol/L (ref 135–145)
Total Bilirubin: 1.5 mg/dL — ABNORMAL HIGH (ref 0.3–1.2)
Total Protein: 7.9 g/dL (ref 6.5–8.1)

## 2019-12-31 LAB — CBC WITH DIFFERENTIAL/PLATELET
Abs Immature Granulocytes: 0.01 10*3/uL (ref 0.00–0.07)
Basophils Absolute: 0 10*3/uL (ref 0.0–0.1)
Basophils Relative: 0 %
Eosinophils Absolute: 0 10*3/uL (ref 0.0–0.5)
Eosinophils Relative: 0 %
HCT: 29.9 % — ABNORMAL LOW (ref 36.0–46.0)
Hemoglobin: 9.1 g/dL — ABNORMAL LOW (ref 12.0–15.0)
Immature Granulocytes: 0 %
Lymphocytes Relative: 21 %
Lymphs Abs: 0.7 10*3/uL (ref 0.7–4.0)
MCH: 25.5 pg — ABNORMAL LOW (ref 26.0–34.0)
MCHC: 30.4 g/dL (ref 30.0–36.0)
MCV: 83.8 fL (ref 80.0–100.0)
Monocytes Absolute: 0.1 10*3/uL (ref 0.1–1.0)
Monocytes Relative: 3 %
Neutro Abs: 2.6 10*3/uL (ref 1.7–7.7)
Neutrophils Relative %: 76 %
Platelets: 146 10*3/uL — ABNORMAL LOW (ref 150–400)
RBC: 3.57 MIL/uL — ABNORMAL LOW (ref 3.87–5.11)
RDW: 16.5 % — ABNORMAL HIGH (ref 11.5–15.5)
WBC: 3.4 10*3/uL — ABNORMAL LOW (ref 4.0–10.5)
nRBC: 0 % (ref 0.0–0.2)

## 2019-12-31 LAB — C-REACTIVE PROTEIN: CRP: 2.9 mg/dL — ABNORMAL HIGH (ref <1.0)

## 2019-12-31 LAB — HEMOGLOBIN A1C
Hgb A1c MFr Bld: 7.6 % — ABNORMAL HIGH (ref 4.8–5.6)
Mean Plasma Glucose: 171.42 mg/dL

## 2019-12-31 LAB — D-DIMER, QUANTITATIVE: D-Dimer, Quant: 2.24 ug/mL-FEU — ABNORMAL HIGH (ref 0.00–0.50)

## 2019-12-31 LAB — PHOSPHORUS: Phosphorus: 3.9 mg/dL (ref 2.5–4.6)

## 2019-12-31 LAB — FERRITIN: Ferritin: 281 ng/mL (ref 11–307)

## 2019-12-31 LAB — MAGNESIUM: Magnesium: 1.9 mg/dL (ref 1.7–2.4)

## 2019-12-31 LAB — GLUCOSE, CAPILLARY
Glucose-Capillary: 247 mg/dL — ABNORMAL HIGH (ref 70–99)
Glucose-Capillary: 280 mg/dL — ABNORMAL HIGH (ref 70–99)
Glucose-Capillary: 248 mg/dL — ABNORMAL HIGH (ref 70–99)
Glucose-Capillary: 206 mg/dL — ABNORMAL HIGH (ref 70–99)

## 2019-12-31 MED ORDER — INSULIN ASPART 100 UNIT/ML ~~LOC~~ SOLN
0.0000 [IU] | Freq: Three times a day (TID) | SUBCUTANEOUS | Status: DC
  Administered 2019-12-31 – 2020-01-01 (×4): 3 [IU] via SUBCUTANEOUS
  Administered 2020-01-01 (×2): 5 [IU] via SUBCUTANEOUS
  Administered 2020-01-02: 13:00:00 7 [IU] via SUBCUTANEOUS
  Administered 2020-01-02: 09:00:00 3 [IU] via SUBCUTANEOUS

## 2019-12-31 MED ORDER — SODIUM CHLORIDE 0.9 % IV SOLN
100.0000 mg | Freq: Every day | INTRAVENOUS | Status: DC
  Administered 2020-01-01 – 2020-01-02 (×2): 100 mg via INTRAVENOUS
  Filled 2019-12-31 (×2): qty 20

## 2019-12-31 MED ORDER — FUROSEMIDE 10 MG/ML IJ SOLN: 40.0000 mg | Freq: Every day | INTRAMUSCULAR | Status: DC

## 2019-12-31 MED ORDER — LISINOPRIL 10 MG PO TABS
20.0000 mg | ORAL_TABLET | Freq: Every day | ORAL | Status: DC
  Administered 2019-12-31 – 2020-01-02 (×3): 20 mg via ORAL
  Filled 2019-12-31 (×4): qty 2

## 2019-12-31 MED ORDER — POTASSIUM CHLORIDE CRYS ER 20 MEQ PO TBCR
40.0000 meq | EXTENDED_RELEASE_TABLET | Freq: Once | ORAL | Status: AC
  Administered 2019-12-31: 15:00:00 40 meq via ORAL
  Filled 2019-12-31: qty 2

## 2019-12-31 MED ORDER — FUROSEMIDE 10 MG/ML IJ SOLN
40.0000 mg | Freq: Two times a day (BID) | INTRAMUSCULAR | Status: DC
  Administered 2019-12-31 – 2020-01-02 (×4): 40 mg via INTRAVENOUS
  Filled 2019-12-31 (×4): qty 4

## 2019-12-31 MED ORDER — MAGNESIUM SULFATE 2 GM/50ML IV SOLN
2.0000 g | Freq: Once | INTRAVENOUS | Status: AC
  Administered 2019-12-31: 15:00:00 2 g via INTRAVENOUS
  Filled 2019-12-31: qty 50

## 2019-12-31 MED ORDER — LABETALOL HCL 5 MG/ML IV SOLN
10.0000 mg | INTRAVENOUS | Status: DC | PRN
  Administered 2019-12-31: 01:00:00 10 mg via INTRAVENOUS
  Filled 2019-12-31: qty 4

## 2019-12-31 NOTE — Discharge Instructions (Addendum)
You are scheduled for an outpatient infusion of Remdesivir at 10:00 am on Monday 1/25 and Tuesday 1/26.  Please report to [facility] at [address].  Drive to the security guard and tell them you are here for an infusion. They will direct you to the front entrance where we will come and get you.  For questions call [phone].  Thanks       Person Under Monitoring Name: Rita [name]  Location: [address] [place] [address]   Infection Prevention Recommendations for Individuals Confirmed to have, or Being Evaluated for, 2019 Novel Coronavirus (COVID-19) Infection Who Receive Care at Home  Individuals who are confirmed to have, or are being evaluated for, COVID-19 should follow the prevention steps below until a healthcare provider or local or state health department says they can return to normal activities.  Stay home except to get medical care You should restrict activities outside your home, except for getting medical care. Do not go to work, school, or public areas, and do not use public transportation or taxis.  Call ahead before visiting your doctor Before your medical appointment, call the healthcare provider and tell them that you have, or are being evaluated for, COVID-19 infection. This will help the healthcare provider's office take steps to keep other people from getting infected. Ask your healthcare provider to call the local or state health department.  Monitor your symptoms Seek prompt medical attention if your illness is worsening (e.g., difficulty breathing). Before going to your medical appointment, call the healthcare provider and tell them that you have, or are being evaluated for, COVID-19 infection. Ask your healthcare provider to call the local or state health department.  Wear a facemask You should wear a facemask that covers your nose and mouth when you are in the same room with other people and when you visit a  healthcare provider. People who live with or visit you should also wear a facemask while they are in the same room with you.  Separate yourself from other people in your home As much as possible, you should stay in a different room from other people in your home. Also, you should use a separate bathroom, if available.  Avoid sharing household items You should not share dishes, drinking glasses, cups, eating utensils, towels, bedding, or other items with other people in your home. After using these items, you should wash them thoroughly with soap and water.  Cover your coughs and sneezes Cover your mouth and nose with a tissue when you cough or sneeze, or you can cough or sneeze into your sleeve. Throw used tissues in a lined trash can, and immediately wash your hands with soap and water for at least 20 seconds or use an alcohol-based hand rub.  Wash your [facility] your hands often and thoroughly with soap and water for at least 20 seconds. You can use an alcohol-based hand sanitizer if soap and water are not available and if your hands are not visibly dirty. Avoid touching your eyes, nose, and mouth with unwashed hands.   Prevention Steps for Caregivers and Household Members of Individuals Confirmed to have, or Being Evaluated for, COVID-19 Infection Being Cared for in the Home  If you live with, or provide care at home for, a person confirmed to have, or being evaluated for, COVID-19 infection please follow these guidelines to prevent infection:  Follow healthcare provider's instructions Make sure that you understand and can help the patient follow  any healthcare provider instructions for all care.  Provide for the patient's basic needs You should help the patient with basic needs in the home and provide support for getting groceries, prescriptions, and other personal needs.  Monitor the patient's symptoms If they are getting sicker, call his or her medical provider and tell  them that the patient has, or is being evaluated for, COVID-19 infection. This will help the healthcare provider's office take steps to keep other people from getting infected. Ask the healthcare provider to call the local or state health department.  Limit the number of people who have contact with the patient  If possible, have only one caregiver for the patient.  Other household members should stay in another home or place of residence. If this is not possible, they should stay  in another room, or be separated from the patient as much as possible. Use a separate bathroom, if available.  Restrict visitors who do not have an essential need to be in the home.  Keep older adults, very young children, and other sick people away from the patient Keep older adults, very young children, and those who have compromised immune systems or chronic health conditions away from the patient. This includes people with chronic heart, lung, or kidney conditions, diabetes, and cancer.  Ensure good ventilation Make sure that shared spaces in the home have good air flow, such as from an air conditioner or an opened window, weather permitting.  Wash your hands often  Wash your hands often and thoroughly with soap and water for at least 20 seconds. You can use an alcohol based hand sanitizer if soap and water are not available and if your hands are not visibly dirty.  Avoid touching your eyes, nose, and mouth with unwashed hands.  Use disposable paper towels to dry your hands. If not available, use dedicated cloth towels and replace them when they become wet.  Wear a facemask and gloves  Wear a disposable facemask at all times in the room and gloves when you touch or have contact with the patient's blood, body fluids, and/or secretions or excretions, such as sweat, saliva, sputum, nasal mucus, vomit, urine, or feces.  Ensure the mask fits over your nose and mouth tightly, and do not touch it during  use.  Throw out disposable facemasks and gloves after using them. Do not reuse.  Wash your hands immediately after removing your facemask and gloves.  If your personal clothing becomes contaminated, carefully remove clothing and launder. Wash your hands after handling contaminated clothing.  Place all used disposable facemasks, gloves, and other waste in a lined container before disposing them with other household waste.  Remove gloves and wash your hands immediately after handling these items.  Do not share dishes, glasses, or other household items with the patient  Avoid sharing household items. You should not share dishes, drinking glasses, cups, eating utensils, towels, bedding, or other items with a patient who is confirmed to have, or being evaluated for, COVID-19 infection.  After the person uses these items, you should wash them thoroughly with soap and water.  Wash laundry thoroughly  Immediately remove and wash clothes or bedding that have blood, body fluids, and/or secretions or excretions, such as sweat, saliva, sputum, nasal mucus, vomit, urine, or feces, on them.  Wear gloves when handling laundry from the patient.  Read and follow directions on labels of laundry or clothing items and detergent. In general, wash and dry with the warmest temperatures recommended on  the label.  Clean all areas the individual has used often  Clean all touchable surfaces, such as counters, tabletops, doorknobs, bathroom fixtures, toilets, phones, keyboards, tablets, and bedside tables, every day. Also, clean any surfaces that may have blood, body fluids, and/or secretions or excretions on them.  Wear gloves when cleaning surfaces the patient has come in contact with.  Use a diluted bleach solution (e.g., dilute bleach with 1 part bleach and 10 parts water) or a household disinfectant with a label that says EPA-registered for coronaviruses. To make a bleach solution at home, add 1 tablespoon  of bleach to 1 quart (4 cups) of water. For a larger supply, add  cup of bleach to 1 gallon (16 cups) of water.  Read labels of cleaning products and follow recommendations provided on product labels. Labels contain instructions for safe and effective use of the cleaning product including precautions you should take when applying the product, such as wearing gloves or eye protection and making sure you have good ventilation during use of the product.  Remove gloves and wash hands immediately after cleaning.  Monitor yourself for signs and symptoms of illness Caregivers and household members are considered close contacts, should monitor their health, and will be asked to limit movement outside of the home to the extent possible. Follow the monitoring steps for close contacts listed on the symptom monitoring form.   ? If you have additional questions, contact your local health department or call the epidemiologist on call at [phone] (available 24/7). ? This guidance is subject to change. For the most up-to-date guidance from [facility], please refer to their website: [email or web address].pl   Information on my medicine - XARELTO (Rivaroxaban)  This medication education was reviewed with me or my healthcare representative as part of my discharge preparation.    Why was Xarelto prescribed for you? Xarelto was prescribed for you to reduce the risk of a blood clot forming that can cause a stroke if you have a medical condition called atrial fibrillation (a type of irregular heartbeat).  What do you need to know about xarelto ? Take your Xarelto ONCE DAILY at the same time every day with your evening meal. If you have difficulty swallowing the tablet whole, you may crush it and mix in applesauce just prior to taking your dose.  Take Xarelto exactly as prescribed by your doctor and DO NOT stop taking Xarelto without talking to the doctor  who prescribed the medication.  Stopping without other stroke prevention medication to take the place of Xarelto may increase your risk of developing a clot that causes a stroke.  Refill your prescription before you run out.  After discharge, you should have regular check-up appointments with your healthcare provider that is prescribing your Xarelto.  In the future your dose may need to be changed if your kidney function or weight changes by a significant amount.  What do you do if you miss a dose? If you are taking Xarelto ONCE DAILY and you miss a dose, take it as soon as you remember on the same day then continue your regularly scheduled once daily regimen the next day. Do not take two doses of Xarelto at the same time or on the same day.   Important Safety Information A possible side effect of Xarelto is bleeding. You should call your healthcare provider right away if you experience any of the following: ? Bleeding from an injury or your nose that does not stop. ? Unusual colored urine (red or  dark brown) or unusual colored stools (red or black). ? Unusual bruising for unknown reasons. ? A serious fall or if you hit your head (even if there is no bleeding).  Some medicines may interact with Xarelto and might increase your risk of bleeding while on Xarelto. To help avoid this, consult your healthcare provider or pharmacist prior to using any new prescription or non-prescription medications, including herbals, vitamins, non-steroidal anti-inflammatory drugs (NSAIDs) and supplements.  This website has more information on Xarelto: [email or web address]-benson.com/.

## 2019-12-31 NOTE — Progress Notes (Addendum)
0040 Pt arrived to floor . EMS states they left the Remdesivir behind, pharmacy ,ade aware. Awaiting new bag to be sent.  0045 BP 173/82. MD made aware, orders given.  PT ON 5 L [place]. 02 SAT 92%. [place] in place. Chlorhexidine bath given. Call bell in reach. Bed alarmed.

## 2019-12-31 NOTE — Progress Notes (Signed)
Dr Sloan Leiter made aware of critical troponin 243, no new orders given

## 2019-12-31 NOTE — Progress Notes (Signed)
Attempted to call pt husband to update, no answer. Pt stated she had her cell phone and would call him later herself

## 2019-12-31 NOTE — Evaluation (Signed)
Physical Therapy Evaluation Patient Details Name: Rita [name] MRN: [id number] DOB: September 25, 1956 Today's Date: 12/31/2019   History of Present Illness  64 y.o. female with medical history significant for chronic diastolic CHF, chronic respiratory failure with hypoxia on 2-3 L supplemental O2 via [place], atrial fibrillation on Xarelto, CKD stage III, type 2 diabetes, hypertension who presents to the ED from her PCP office for evaluation of hypoxia.  Clinical Impression   Pt admitted with above dx and hx. She states was home with family and was quite independent with single tip cane. She states when she was unable to perform an ADL task her spouse would assist with this. This pm pt seems fatigued but has been sitting in recliner and is agreeable to assessment. Pt able to stand from recliner and also commode with SBA/min guard assist with RW. Ambulated in room approx 22ft with RW and min guard assist. Pt was on 3L/min via [place] and noted min desat with activity to be 87%. Pt has been instructed on use of incentive spirometer and also flutter valve. Pt will greatly benefit from continued PT tx while in hospital and home health PT at d/c to address deficits in activity tolerance, independence and overall safety with functional mobility.     Follow Up Recommendations Home health PT    Equipment Recommendations  Rolling walker with 5" wheels    Recommendations for Other Services OT consult     Precautions / Restrictions Precautions Precautions: Fall Restrictions Weight Bearing Restrictions: No      Mobility  Bed Mobility               General bed mobility comments: pt was sitting in recliner at therapist arrival  Transfers Overall transfer level: Needs assistance Equipment used: Rolling walker (2 wheeled) Transfers: Sit to/from [employer] Transfers Sit to Stand: Min guard Stand pivot transfers: Min guard       General transfer comment: transfers from recliner and also  commode   Ambulation/Gait Ambulation/Gait assistance: Min guard Gait Distance (Feet): 30 Feet Assistive device: Rolling walker (2 wheeled) Gait Pattern/deviations: Step-through pattern     General Gait Details: very slow cadence   Stairs            Wheelchair Mobility    Modified Rankin (Stroke Patients Only)       Balance Overall balance assessment: Needs assistance Sitting-balance support: Feet supported Sitting balance-Leahy Scale: Fair     Standing balance support: During functional activity;Bilateral upper extremity supported Standing balance-Leahy Scale: Fair                               Pertinent Vitals/Pain Pain Assessment: No/denies pain    Home Living Family/patient expects to be discharged to:: Private residence Living Arrangements: Spouse/significant other Available Help at Discharge: Family Type of Home: House Home Access: Stairs to enter Entrance Stairs-Rails: Right Entrance Stairs-Number of Steps: 2 front 2 back Home Layout: One level Home Equipment: Cane - single point;Grab bars - tub/shower      Prior Function Level of Independence: Independent with assistive device(s)               Hand Dominance        Extremity/Trunk Assessment   Upper Extremity Assessment Upper Extremity Assessment: Generalized weakness    Lower Extremity Assessment Lower Extremity Assessment: Generalized weakness    Cervical / Trunk Assessment Cervical / Trunk Assessment: Normal  Communication   Communication: No  difficulties  Cognition Arousal/Alertness: Awake/alert Behavior During Therapy: WFL for tasks assessed/performed Overall Cognitive Status: Within Functional Limits for tasks assessed                                        General Comments      Exercises Other Exercises Other Exercises: initiated use of incentive spirometer x 5 Other Exercises: initiated use of flutter valve x 10   Assessment/Plan     PT Assessment Patient needs continued PT services  PT Problem List Decreased strength;Decreased activity tolerance;Decreased balance;Decreased mobility;Decreased coordination;Decreased knowledge of use of DME;Decreased safety awareness       PT Treatment Interventions Gait training;DME instruction;Functional mobility training;Therapeutic activities;Therapeutic exercise;Balance training;Neuromuscular re-education;Patient/family education    PT Goals (Current goals can be found in the Care Plan section)  Acute Rehab PT Goals Patient Stated Goal: to go home  PT Goal Formulation: With patient Time For Goal Achievement: 01/14/20 Potential to Achieve Goals: Good    Frequency Min 3X/week   Barriers to discharge        Co-evaluation               AM-PAC PT "6 Clicks" Mobility  Outcome Measure Help needed turning from your back to your side while in a flat bed without using bedrails?: A Little Help needed moving from lying on your back to sitting on the side of a flat bed without using bedrails?: A Little Help needed moving to and from a bed to a chair (including a wheelchair)?: A Little Help needed standing up from a chair using your arms (e.g., wheelchair or bedside chair)?: A Little Help needed to walk in hospital room?: A Little Help needed climbing 3-5 steps with a railing? : A Lot 6 Click Score: 17    End of Session Equipment Utilized During Treatment: Oxygen Activity Tolerance: Patient limited by fatigue;Patient limited by lethargy;Treatment limited secondary to medical complications (Comment) Patient left: in chair;with call bell/phone within reach Nurse Communication: Mobility status PT Visit Diagnosis: Other abnormalities of gait and mobility (R26.89);Muscle weakness (generalized) (M62.81)    Time: [id number] PT Time Calculation (min) (ACUTE ONLY): 36 min   Charges:   PT Evaluation $PT Eval Moderate Complexity: 1 Mod PT Treatments $Gait Training: 8-22  mins $Therapeutic Activity: 8-22 mins        Horald Chestnut, PT   Delford Field 12/31/2019, 4:40 PM

## 2019-12-31 NOTE — Progress Notes (Signed)
PROGRESS NOTE                                                                                                                                                                                                             Patient Demographics:    Rita [name], is a 64 y.o. female, DOB - 04-02-1956, [id number]  Outpatient Primary MD for the patient is [name], MD   Admit date - 12/30/2019   LOS - 1  Chief Complaint  Patient presents with  . Weakness       Brief Narrative: Patient is a 64 y.o. female with PMHx of chronic diastolic heart failure, chronic hypoxic respiratory failure on home O2 2-3 L of oxygen, CKD stage III, A. fib on Xarelto, DM-2, HTN-presented to her PCPs office for evaluation of 1-2 weeks history of gradually worsening shortness of breath-she was sent to the ED by her PCP-and found to have acute hypoxemic respiratory failure secondary to COVID-19 pneumonia.    Subjective:    Rita [name] today feels better-on 3 L of oxygen.   Assessment  & Plan :   Acute on chronic hypoxic Resp Failure due to Covid 19 Viral pneumonia and decompensated diastolic heart failure: Improving-down to 3 L of oxygen (previously on 5 L).  Still with 2+ pitting edema in her lower extremities.  Plans are to continue steroids/remdesivir-and restart furosemide 40 mg IV twice daily.  Follow weights/intake and output.  Fever: afebrile  O2 requirements:  SpO2: 95 % O2 Flow Rate (L/min): 3 L/min   COVID-19 Labs: Recent Labs    12/30/19 2147 12/30/19 2246 12/31/19 0444  DDIMER  --  2.34* 2.24*  FERRITIN 298  --  281  LDH 348*  --   --   CRP 2.8*  --  2.9*       Component Value Date/Time   BNP 427.0 (H) 12/30/2019 1800    Recent Labs  Lab 12/30/19 2147  PROCALCITON <0.10    Lab Results  Component Value Date   SARSCOV2NAA POSITIVE (A) 12/30/2019     COVID-19 Medications: Steroids: 1/21>> Remdesivir:  1/21>>  Prone/Incentive Spirometry: encouraged encourage incentive spirometry use 3-4/hour.  DVT Prophylaxis  : Xarelto  Minimally elevated troponin: Secondary demand ischemia-trend is flat-not consistent with ACS.  CKD stage IIIa: At baseline-monitor.  PAF: Continue Coreg and Xarelto.  HTN: Controlled-continue lisinopril and Coreg.  Follow and optimize.  DM-2: Follow CBGs closely while on SSI.  CBG (last 3)  Recent Labs    12/31/19 0835 12/31/19 1226  GLUCAP 247* 280*    GERD: Continue PPI  Consults  :  None  Procedures  :  None  ABG: No results found for: PHART, PCO2ART, PO2ART, HCO3, TCO2, ACIDBASEDEF, O2SAT  Vent Settings: N/A  Condition - Extremely Guarded  Family Communication  : Left a voicemail for [employer]  Code Status :  Full Code  Diet :  Diet Order            Diet heart healthy/carb modified Room service appropriate? Yes; Fluid consistency: Thin  Diet effective now               Disposition Plan  :  Remain hospitalized-suspect home in the next 2 days or so.  Barriers to discharge: Hypoxia requiring O2 supplementation/complete 5 days of IV Remdesivir  Antimicorbials  :    Anti-infectives (From admission, onward)   Start     Dose/Rate Route Frequency Ordered Stop   01/01/20 1000  remdesivir 100 mg in sodium chloride 0.9 % 100 mL IVPB     100 mg 200 mL/hr over 30 Minutes Intravenous Daily 12/31/19 0103 01/05/20 0959   12/31/19 1600  remdesivir 100 mg in sodium chloride 0.9 % 100 mL IVPB  Status:  Discontinued     100 mg 200 mL/hr over 30 Minutes Intravenous Daily 12/30/19 2149 12/31/19 0103   12/30/19 2200  remdesivir 200 mg in sodium chloride 0.9% 250 mL IVPB     200 mg 580 mL/hr over 30 Minutes Intravenous Once 12/30/19 2149 12/31/19 0242      Inpatient Medications  Scheduled Meds: . albuterol  2 puff Inhalation Q6H  . vitamin C  500 mg Oral Daily  . carvedilol  25 mg Oral BID WC  . dexamethasone  6 mg Oral Q24H  .  furosemide  40 mg Intravenous BID  . insulin aspart  0-9 Units Subcutaneous TID WC  . lisinopril  20 mg Oral Daily  . pantoprazole  40 mg Oral Daily  . potassium chloride SA  20 mEq Oral Daily  . rivaroxaban  20 mg Oral Q supper  . zinc sulfate  220 mg Oral Daily   Continuous Infusions: . [START ON 01/01/2020] remdesivir 100 mg in NS 100 mL     PRN Meds:.acetaminophen, chlorpheniramine-HYDROcodone, guaiFENesin-dextromethorphan, labetalol, ondansetron **OR** ondansetron (ZOFRAN) IV   Time Spent in minutes  25   Oren Binet M.D on 12/31/2019 at 1:42 PM  To page go to [email or web address] - use universal password  [facility] -  Office  [phone]    Objective:   Vitals:   12/31/19 0800 12/31/19 0900 12/31/19 0948 12/31/19 1200  BP:    (!) 161/78  Pulse:      Resp:      Temp: (!) 97.3 F (36.3 C)   97.8 F (36.6 C)  TempSrc: Oral   Oral  SpO2:   95%   Weight:  127.5 kg    Height:  5\' 1"  (1.549 m)      Wt Readings from Last 3 Encounters:  12/31/19 127.5 kg     Intake/Output Summary (Last 24 hours) at 12/31/2019 1342 Last data filed at 12/31/2019 0800 Gross per 24 hour  Intake --  Output 900 ml  Net -900 ml     Physical Exam Gen Exam:Alert awake-not in any distress HEENT:atraumatic, normocephalic  Chest: Few bibasilar rales. CVS:S1S2 regular Abdomen:soft non tender, non distended Extremities:++ edema Neurology: Non focal Skin: no rash   Data Review:    CBC Recent Labs  Lab 12/30/19 1800 12/31/19 0444  WBC 4.2 3.4*  HGB 9.3* 9.1*  HCT 29.8* 29.9*  PLT 141* 146*  MCV 83.7 83.8  MCH 26.1 25.5*  MCHC 31.2 30.4  RDW 16.6* 16.5*  LYMPHSABS 0.9 0.7  MONOABS 0.3 0.1  EOSABS 0.2 0.0  BASOSABS 0.0 0.0    Chemistries  Recent Labs  Lab 12/30/19 1800 12/31/19 0444  NA 137 140  K 3.5 3.7  CL 99 99  CO2 29 30  GLUCOSE 185* 230*  BUN 13 15  CREATININE 1.30* 1.03*  CALCIUM 8.4* 8.5*  MG  --  1.9  AST 48* 45*  ALT 26 24  ALKPHOS 83 81   BILITOT 1.5* 1.5*   ------------------------------------------------------------------------------------------------------------------ No results for input(s): CHOL, HDL, LDLCALC, TRIG, CHOLHDL, LDLDIRECT in the last 72 hours.  Lab Results  Component Value Date   HGBA1C 7.6 (H) 12/31/2019   ------------------------------------------------------------------------------------------------------------------ No results for input(s): TSH, T4TOTAL, T3FREE, THYROIDAB in the last 72 hours.  Invalid input(s): FREET3 ------------------------------------------------------------------------------------------------------------------ Recent Labs    12/30/19 2147 12/31/19 0444  FERRITIN 298 281    Coagulation profile Recent Labs  Lab 12/30/19 1800  INR 2.1*    Recent Labs    12/30/19 2246 12/31/19 0444  DDIMER 2.34* 2.24*    Cardiac Enzymes No results for input(s): CKMB, TROPONINI, MYOGLOBIN in the last 168 hours.  Invalid input(s): CK ------------------------------------------------------------------------------------------------------------------    Component Value Date/Time   BNP 427.0 (H) 12/30/2019 1800    Micro Results Recent Results (from the past 240 hour(s))  Culture, blood (routine x 2)     Status: None (Preliminary result)   Collection Time: 12/30/19  6:15 PM   Specimen: BLOOD RIGHT ARM  Result Value Ref Range Status   Specimen Description BLOOD RIGHT ARM  Final   Special Requests   Final    BOTTLES DRAWN AEROBIC AND ANAEROBIC Blood Culture adequate volume   Culture   Final    NO GROWTH < 24 HOURS Performed at [facility], [place] [address], [place], [place] [address]    Report Status PENDING  Incomplete  Respiratory Panel by RT PCR (Flu A&B, Covid) - Nasopharyngeal Swab     Status: Abnormal   Collection Time: 12/30/19  6:24 PM   Specimen: Nasopharyngeal Swab  Result Value Ref Range Status   SARS Coronavirus 2 by RT PCR POSITIVE (A) NEGATIVE Final     Comment: RESULT CALLED TO, READ BACK BY AND VERIFIED WITH: L CHILTON RN 12/30/19 2036 JDW (NOTE) SARS-CoV-2 target nucleic acids are DETECTED. SARS-CoV-2 RNA is generally detectable in upper respiratory specimens  during the acute phase of infection. Positive results are indicative of the presence of the identified virus, but do not rule out bacterial infection or co-infection with other pathogens not detected by the test. Clinical correlation with patient history and other diagnostic information is necessary to determine patient infection status. The expected result is Negative. Fact Sheet for Patients:  PinkCheek.be Fact Sheet for Healthcare Providers: GravelBags.it This test is not yet approved or cleared by the [place] FDA and  has been authorized for detection and/or diagnosis of SARS-CoV-2 by FDA under an Emergency Use Authorization (EUA).  This EUA will remain in effect (meaning this test can be used) for the  duration of  the COVID-19 declaration under Section 564(b)(1)  of the Act, 21 U.S.C. section 360bbb-3(b)(1), unless the authorization is terminated or revoked sooner.    Influenza A by PCR NEGATIVE NEGATIVE Final   Influenza B by PCR NEGATIVE NEGATIVE Final    Comment: (NOTE) The Xpert Xpress SARS-CoV-2/FLU/RSV assay is intended as an aid in  the diagnosis of influenza from Nasopharyngeal swab specimens and  should not be used as a sole basis for treatment. Nasal washings and  aspirates are unacceptable for Xpert Xpress SARS-CoV-2/FLU/RSV  testing. Fact Sheet for Patients: PinkCheek.be Fact Sheet for Healthcare Providers: GravelBags.it This test is not yet approved or cleared by the [place] FDA and  has been authorized for detection and/or diagnosis of SARS-CoV-2 by  FDA under an Emergency Use Authorization (EUA). This EUA will remain  in  effect (meaning this test can be used) for the duration of the  Covid-19 declaration under Section 564(b)(1) of the Act, 21  U.S.C. section 360bbb-3(b)(1), unless the authorization is  terminated or revoked. Performed at [facility], [place] [address] [place], [place] [address]   Culture, blood (routine x 2)     Status: None (Preliminary result)   Collection Time: 12/30/19  7:00 PM   Specimen: BLOOD RIGHT ARM  Result Value Ref Range Status   Specimen Description BLOOD RIGHT ARM  Final   Special Requests   Final    BOTTLES DRAWN AEROBIC AND ANAEROBIC Blood Culture adequate volume   Culture   Final    NO GROWTH < 24 HOURS Performed at [facility], [place] [address] [place], [place] [address]    Report Status PENDING  Incomplete    Radiology Reports DG Chest Port 1 View  Result Date: 12/30/2019 CLINICAL DATA:  Shortness of breath. EXAM: PORTABLE CHEST 1 VIEW COMPARISON:  November 26, 2017 FINDINGS: Moderate severity infiltrates are seen within bilateral lung bases, left slightly greater than right. Small bilateral pleural effusions are noted. There is no evidence of a pneumothorax. The cardiac silhouette is moderately enlarged. Radiopaque surgical clips are seen overlying the left axilla. These are present on the prior exam. Degenerative changes seen within the thoracic spine. IMPRESSION: 1. Moderate severity bibasilar infiltrates, left slightly greater than right. 2. Small bilateral pleural effusions. Electronically Signed   By: Virgina Norfolk M.D.   On: 12/30/2019 18:10

## 2019-12-31 NOTE — Progress Notes (Signed)
Inpatient Diabetes Program Recommendations  AACE/ADA: New Consensus Statement on Inpatient Glycemic Control (2015)  Target Ranges:  Prepandial:   less than 140 mg/dL      Peak postprandial:   less than 180 mg/dL (1-2 hours)      Critically ill patients:  140 - 180 mg/dL   Lab Results  Component Value Date   GLUCAP 280 (H) 12/31/2019   HGBA1C 7.6 (H) 12/31/2019    Review of Glycemic Control Results for Rita [name], Rita [name] (MRN [id number]) as of 12/31/2019 17:17  Ref. Range 12/31/2019 08:35 12/31/2019 12:26  Glucose-Capillary Latest Ref Range: 70 - 99 mg/dL 247 (H) 280 (H)   Diabetes history: DM 2 Outpatient Diabetes medications:  Glipzide XL 10 mg daily Current orders for Inpatient glycemic control:  Decadron 6 mg daily Novolog sensitive tid with meals Inpatient Diabetes Program Recommendations:    Please consider adding Levemir 10 units bid and Novolog 5 units tid with meals (hold if patient eats less than 50%) while patient is on steroids.   Thanks,  Adah Perl, RN, BC-ADM Inpatient Diabetes Coordinator Pager [phone]

## 2020-01-01 LAB — COMPREHENSIVE METABOLIC PANEL
ALT: 24 U/L (ref 0–44)
AST: 44 U/L — ABNORMAL HIGH (ref 15–41)
Albumin: 3.5 g/dL (ref 3.5–5.0)
Alkaline Phosphatase: 78 U/L (ref 38–126)
Anion gap: 8 (ref 5–15)
BUN: 17 mg/dL (ref 8–23)
CO2: 33 mmol/L — ABNORMAL HIGH (ref 22–32)
Calcium: 8.7 mg/dL — ABNORMAL LOW (ref 8.9–10.3)
Chloride: 98 mmol/L (ref 98–111)
Creatinine, Ser: 1.01 mg/dL — ABNORMAL HIGH (ref 0.44–1.00)
GFR calc Af Amer: >60 mL/min (ref >60)
GFR calc non Af Amer: 58 mL/min — ABNORMAL LOW (ref >60)
Glucose, Bld: 206 mg/dL — ABNORMAL HIGH (ref 70–99)
Potassium: 4.4 mmol/L (ref 3.5–5.1)
Sodium: 139 mmol/L (ref 135–145)
Total Bilirubin: 1.3 mg/dL — ABNORMAL HIGH (ref 0.3–1.2)
Total Protein: 8 g/dL (ref 6.5–8.1)

## 2020-01-01 LAB — CBC WITH DIFFERENTIAL/PLATELET
Abs Immature Granulocytes: 0 10*3/uL (ref 0.00–0.07)
Basophils Absolute: 0 10*3/uL (ref 0.0–0.1)
Basophils Relative: 0 %
Eosinophils Absolute: 0 10*3/uL (ref 0.0–0.5)
Eosinophils Relative: 0 %
HCT: 29.7 % — ABNORMAL LOW (ref 36.0–46.0)
Hemoglobin: 9.4 g/dL — ABNORMAL LOW (ref 12.0–15.0)
Immature Granulocytes: 0 %
Lymphocytes Relative: 32 %
Lymphs Abs: 0.8 10*3/uL (ref 0.7–4.0)
MCH: 26 pg (ref 26.0–34.0)
MCHC: 31.6 g/dL (ref 30.0–36.0)
MCV: 82 fL (ref 80.0–100.0)
Monocytes Absolute: 0.3 10*3/uL (ref 0.1–1.0)
Monocytes Relative: 12 %
Neutro Abs: 1.4 10*3/uL — ABNORMAL LOW (ref 1.7–7.7)
Neutrophils Relative %: 56 %
Platelets: 164 10*3/uL (ref 150–400)
RBC: 3.62 MIL/uL — ABNORMAL LOW (ref 3.87–5.11)
RDW: 16.4 % — ABNORMAL HIGH (ref 11.5–15.5)
WBC: 2.5 10*3/uL — ABNORMAL LOW (ref 4.0–10.5)
nRBC: 0 % (ref 0.0–0.2)

## 2020-01-01 LAB — FERRITIN: Ferritin: 287 ng/mL (ref 11–307)

## 2020-01-01 LAB — C-REACTIVE PROTEIN: CRP: 2.3 mg/dL — ABNORMAL HIGH (ref <1.0)

## 2020-01-01 LAB — D-DIMER, QUANTITATIVE: D-Dimer, Quant: 1.96 ug/mL-FEU — ABNORMAL HIGH (ref 0.00–0.50)

## 2020-01-01 LAB — MAGNESIUM: Magnesium: 2.2 mg/dL (ref 1.7–2.4)

## 2020-01-01 LAB — GLUCOSE, CAPILLARY
Glucose-Capillary: 231 mg/dL — ABNORMAL HIGH (ref 70–99)
Glucose-Capillary: 256 mg/dL — ABNORMAL HIGH (ref 70–99)
Glucose-Capillary: 265 mg/dL — ABNORMAL HIGH (ref 70–99)
Glucose-Capillary: 208 mg/dL — ABNORMAL HIGH (ref 70–99)

## 2020-01-01 LAB — PHOSPHORUS: Phosphorus: 2.3 mg/dL — ABNORMAL LOW (ref 2.5–4.6)

## 2020-01-01 MED ORDER — DEXAMETHASONE 4 MG PO TABS
4.0000 mg | ORAL_TABLET | ORAL | Status: DC
  Administered 2020-01-01: 22:00:00 4 mg via ORAL
  Filled 2020-01-01: qty 1

## 2020-01-01 NOTE — Progress Notes (Signed)
Patient scheduled for outpatient Remdesivir infusion at 10:00 am on Monday 1/25 and Tuesday 1/26.  Please advise them to report to [facility] at [address]  Drive to the security guard and tell them you are here for an infusion. They will direct you to the front entrance where we will come and get you.  For questions call [phone].  Thanks

## 2020-01-01 NOTE — Progress Notes (Signed)
Pt husband updated at this time on plan of care. He reports he will be the one to pick up at time of d/c tomorrow in a black [employer]. He appreciated the update.

## 2020-01-01 NOTE — Evaluation (Signed)
Occupational Therapy Evaluation Patient Details Name: Rita [name] MRN: [id number] DOB: 06-03-1956 Today's Date: 01/01/2020    History of Present Illness 64 y.o. female with medical history significant for chronic diastolic CHF, chronic respiratory failure with hypoxia on 2-3 L supplemental O2 via [place], atrial fibrillation on Xarelto, CKD stage III, type 2 diabetes, hypertension who presents to the ED from her PCP office for evaluation of hypoxia.   Clinical Impression   PTA, pt was living with her husband who assisted with LB ADLs as needed and performed ADLs; pt performed BADLs and used a SPC for mobility. Pt currently requiring Min Guard-Min A for LB ADLs and functional mobility. Pt presenting with decreased activity tolerance and benefits from rest breaks. Despite fatigue, pt very motivated and feel she is close to baseline. SpO2 maintaining in 90s on 3L during activity. Pt reporting she wishes she could take a shower in her tub at home but has difficulty stepping over tub; discussed and recommended tub bench and pt verbalized desire for tub bench. Pt would benefit from further acute OT to facilitate safe dc. Recommend dc to home once medically stable per physician.     Follow Up Recommendations  No OT follow up;Supervision/Assistance - 24 hour    Equipment Recommendations  Tub/shower bench    Recommendations for Other Services PT consult     Precautions / Restrictions Precautions Precautions: Fall Restrictions Weight Bearing Restrictions: No      Mobility Bed Mobility               General bed mobility comments: Sitting at [facility] with RN in room  Transfers Overall transfer level: Needs assistance Equipment used: Rolling walker (2 wheeled) Transfers: Sit to/from Stand;Stand Pivot Transfers Sit to Stand: Min guard         General transfer comment: Min Guard A for safety    Balance Overall balance assessment: Needs assistance Sitting-balance support: Feet  supported Sitting balance-Leahy Scale: Fair     Standing balance support: During functional activity;Bilateral upper extremity supported Standing balance-Leahy Scale: Fair                             ADL either performed or assessed with clinical judgement   ADL Overall ADL's : Needs assistance/impaired Eating/Feeding: Set up;Sitting   Grooming: Set up;Sitting   Upper Body Bathing: Supervision/ safety;Set up;Sitting   Lower Body Bathing: Min guard;Sit to/from stand   Upper Body Dressing : Supervision/safety;Set up;Sitting   Lower Body Dressing: Minimal assistance;Sit to/from stand   Toilet Transfer: Min guard;Ambulation;RW;BSC Toilet Transfer Details (indicate cue type and reason): MIn guard A for safety         Functional mobility during ADLs: Min guard;Rolling walker General ADL Comments: Feel pt is close to baseline. however, presenting with decreased acitivty tolerance and requiring rest breaks.      Vision         Perception     Praxis      Pertinent Vitals/Pain Pain Assessment: No/denies pain     Hand Dominance Right   Extremity/Trunk Assessment Upper Extremity Assessment Upper Extremity Assessment: Generalized weakness   Lower Extremity Assessment Lower Extremity Assessment: Generalized weakness   Cervical / Trunk Assessment Cervical / Trunk Assessment: Normal   Communication Communication Communication: No difficulties   Cognition Arousal/Alertness: Awake/alert Behavior During Therapy: WFL for tasks assessed/performed Overall Cognitive Status: Within Functional Limits for tasks assessed  General Comments: Very pleasant and motivated   General Comments       Exercises Exercises: Other exercises Other Exercises Other Exercises: incentive spirometer x 10 Other Exercises: flutter valve x 10   Shoulder Instructions      Home Living Family/patient expects to be discharged to::  Private residence Living Arrangements: Spouse/significant other Available Help at Discharge: Family Type of Home: House Home Access: Stairs to enter [employer] of Steps: 2 front 2 back Entrance Stairs-Rails: Right Home Layout: One level     Bathroom Shower/Tub: Teacher, early years/pre: Handicapped height     Home Equipment: [place] - single point;Grab bars - tub/shower   Additional Comments: Home O2 - 3L      Prior Functioning/Environment Level of Independence: Independent with assistive device(s)        Comments: Uses a cane for mobility. Performs ADLs. husband performs IADLs. Sometimes her husband assists with LB ADLs such as shoes and socks depending on how she is feeling.        OT Problem List: Decreased strength;Decreased activity tolerance;Impaired balance (sitting and/or standing);Decreased knowledge of use of DME or AE;Decreased knowledge of precautions;Cardiopulmonary status limiting activity      OT Treatment/Interventions: Self-care/ADL training;Therapeutic exercise;Energy conservation;DME and/or AE instruction;Therapeutic activities;Patient/family education    OT Goals(Current goals can be found in the care plan section) Acute Rehab OT Goals Patient Stated Goal: to go home  OT Goal Formulation: With patient Time For Goal Achievement: 01/15/20 Potential to Achieve Goals: Good  OT Frequency: Min 2X/week   Barriers to D/C:            Co-evaluation              AM-PAC OT "6 Clicks" Daily Activity     Outcome Measure Help from another person eating meals?: None Help from another person taking care of personal grooming?: A Little Help from another person toileting, which includes using toliet, bedpan, or urinal?: A Little Help from another person bathing (including washing, rinsing, drying)?: A Little Help from another person to put on and taking off regular upper body clothing?: None Help from another person to put on and taking  off regular lower body clothing?: A Little 6 Click Score: 20   End of Session Equipment Utilized During Treatment: Oxygen;Rolling walker(3L) Nurse Communication: Mobility status  Activity Tolerance: Patient tolerated treatment well Patient left: in chair;with call bell/phone within reach  OT Visit Diagnosis: Unsteadiness on feet (R26.81);Other abnormalities of gait and mobility (R26.89);Muscle weakness (generalized) (M62.81)                Time: [id number] OT Time Calculation (min): 29 min Charges:  OT General Charges $OT Visit: 1 Visit OT Evaluation $OT Eval Moderate Complexity: 1 Mod OT Treatments $Self Care/Home Management : 8-22 mins  Ersa Delaney MSOT, OTR/L Acute Rehab Pager: [phone] Office: [place] 01/01/2020, 12:28 PM

## 2020-01-01 NOTE — Progress Notes (Signed)
PROGRESS NOTE                                                                                                                                                                                                             Patient Demographics:    Rita [name], is a 64 y.o. female, DOB - Oct 23, 1956, [id number]  Outpatient Primary MD for the patient is [name], MD   Admit date - 12/30/2019   LOS - 2  Chief Complaint  Patient presents with  . Weakness       Brief Narrative: Patient is a 64 y.o. female with PMHx of chronic diastolic heart failure, chronic hypoxic respiratory failure on home O2 2-3 L of oxygen, CKD stage III, A. fib on Xarelto, DM-2, HTN-presented to her PCPs office for evaluation of 1-2 weeks history of gradually worsening shortness of breath-she was sent to the ED by her PCP-and found to have acute hypoxemic respiratory failure secondary to COVID-19 pneumonia.    Subjective:  Feels much better-she is down to 2 L of oxygen this morning (baseline regimen of home O2-2 L)   Assessment  & Plan :   Acute on chronic hypoxic Resp Failure due to Covid 19 Viral pneumonia and decompensated diastolic heart failure: Improved-back down to 2 L which is her baseline regimen at home.  Still with some lower extremity edema-continue IV Lasix for 1 more day before changing to oral regimen.  Continue steroids/Remdesivir-if she continues to improve-suspect she can go home on 1/24-and complete her remdesivir infusion in the infusion center on 1/25 and 1/26 (appointment already made)   Fever: afebrile  O2 requirements:  SpO2: 96 % O2 Flow Rate (L/min): 3 L/min   COVID-19 Labs: Recent Labs    12/30/19 2147 12/30/19 2246 12/31/19 0444 01/01/20 0107  DDIMER  --  2.34* 2.24* 1.96*  FERRITIN 298  --  281 287  LDH 348*  --   --   --   CRP 2.8*  --  2.9* 2.3*       Component Value Date/Time   BNP 427.0 (H)  12/30/2019 1800    Recent Labs  Lab 12/30/19 2147  PROCALCITON <0.10    Lab Results  Component Value Date   SARSCOV2NAA POSITIVE (A) 12/30/2019     COVID-19 Medications: Steroids: 1/21>> Remdesivir: 1/21>>  Prone/Incentive Spirometry: encouraged  encourage incentive spirometry use 3-4/hour.  DVT Prophylaxis  : Xarelto  Minimally elevated troponin: Secondary demand ischemia-trend is flat-not consistent with ACS.  CKD stage IIIa: At baseline-monitor.  PAF: Continue Coreg and Xarelto.  HTN: Controlled-continue lisinopril and Coreg.  Follow and optimize.  DM-2: Follow CBGs closely while on SSI.  CBG (last 3)  Recent Labs    12/31/19 1658 12/31/19 2126 01/01/20 0724  GLUCAP 248* 206* 231*    GERD: Continue PPI  Consults  :  None  Procedures  :  None  ABG: No results found for: PHART, PCO2ART, PO2ART, HCO3, TCO2, ACIDBASEDEF, O2SAT  Vent Settings: N/A  Condition - Extremely Guarded  Family Communication  : Left a voicemail for [employer] on 1/22 and on 1/23.  Code Status :  Full Code  Diet :  Diet Order            Diet heart healthy/carb modified Room service appropriate? Yes; Fluid consistency: Thin  Diet effective now               Disposition Plan  :  Remain hospitalized-probably home on 1/24  Barriers to discharge: Hypoxia requiring O2 supplementation/complete 5 days of IV Remdesivir  Antimicorbials  :    Anti-infectives (From admission, onward)   Start     Dose/Rate Route Frequency Ordered Stop   01/01/20 1000  remdesivir 100 mg in sodium chloride 0.9 % 100 mL IVPB     100 mg 200 mL/hr over 30 Minutes Intravenous Daily 12/31/19 0103 01/05/20 0959   12/31/19 1600  remdesivir 100 mg in sodium chloride 0.9 % 100 mL IVPB  Status:  Discontinued     100 mg 200 mL/hr over 30 Minutes Intravenous Daily 12/30/19 2149 12/31/19 0103   12/30/19 2200  remdesivir 200 mg in sodium chloride 0.9% 250 mL IVPB     200 mg 580 mL/hr over 30 Minutes  Intravenous Once 12/30/19 2149 12/31/19 0242      Inpatient Medications  Scheduled Meds: . albuterol  2 puff Inhalation Q6H  . vitamin C  500 mg Oral Daily  . carvedilol  25 mg Oral BID WC  . dexamethasone  6 mg Oral Q24H  . furosemide  40 mg Intravenous BID  . insulin aspart  0-9 Units Subcutaneous TID WC  . lisinopril  20 mg Oral Daily  . pantoprazole  40 mg Oral Daily  . rivaroxaban  20 mg Oral Q supper  . zinc sulfate  220 mg Oral Daily   Continuous Infusions: . remdesivir 100 mg in NS 100 mL 100 mg (01/01/20 0903)   PRN Meds:.acetaminophen, chlorpheniramine-HYDROcodone, guaiFENesin-dextromethorphan, labetalol, ondansetron **OR** ondansetron (ZOFRAN) IV   Time Spent in minutes  25   Oren Binet M.D on 01/01/2020 at 11:01 AM  To page go to [email or web address] - use universal password  [facility] -  Office  [phone]    Objective:   Vitals:   01/01/20 0000 01/01/20 0400 01/01/20 0700 01/01/20 0800  BP: 136/70 120/68  (!) 158/73  Pulse: 60 (!) 58 83 (!) 55  Resp: 15 12 (!) 27 13  Temp: 98.2 F (36.8 C)   97.7 F (36.5 C)  TempSrc: Oral   Oral  SpO2: 94% 90% (!) 89% 96%  Weight:      Height:        Wt Readings from Last 3 Encounters:  12/31/19 127.5 kg     Intake/Output Summary (Last 24 hours) at 01/01/2020 1101 Last data filed at 01/01/2020 0700 Gross per  24 hour  Intake --  Output 1400 ml  Net -1400 ml     Physical Exam Gen Exam:Alert awake-not in any distress HEENT:atraumatic, normocephalic Chest: B/L clear to auscultation anteriorly CVS:S1S2 regular Abdomen:soft non tender, non distended Extremities:+ edema Neurology: Non focal Skin: no rash   Data Review:    CBC Recent Labs  Lab 12/30/19 1800 12/31/19 0444 01/01/20 0107  WBC 4.2 3.4* 2.5*  HGB 9.3* 9.1* 9.4*  HCT 29.8* 29.9* 29.7*  PLT 141* 146* 164  MCV 83.7 83.8 82.0  MCH 26.1 25.5* 26.0  MCHC 31.2 30.4 31.6  RDW 16.6* 16.5* 16.4*  LYMPHSABS 0.9 0.7 0.8  MONOABS  0.3 0.1 0.3  EOSABS 0.2 0.0 0.0  BASOSABS 0.0 0.0 0.0    Chemistries  Recent Labs  Lab 12/30/19 1800 12/31/19 0444 01/01/20 0107  NA 137 140 139  K 3.5 3.7 4.4  CL 99 99 98  CO2 29 30 33*  GLUCOSE 185* 230* 206*  BUN 13 15 17   CREATININE 1.30* 1.03* 1.01*  CALCIUM 8.4* 8.5* 8.7*  MG  --  1.9 2.2  AST 48* 45* 44*  ALT 26 24 24   ALKPHOS 83 81 78  BILITOT 1.5* 1.5* 1.3*   ------------------------------------------------------------------------------------------------------------------ No results for input(s): CHOL, HDL, LDLCALC, TRIG, CHOLHDL, LDLDIRECT in the last 72 hours.  Lab Results  Component Value Date   HGBA1C 7.6 (H) 12/31/2019   ------------------------------------------------------------------------------------------------------------------ No results for input(s): TSH, T4TOTAL, T3FREE, THYROIDAB in the last 72 hours.  Invalid input(s): FREET3 ------------------------------------------------------------------------------------------------------------------ Recent Labs    12/31/19 0444 01/01/20 0107  FERRITIN 281 287    Coagulation profile Recent Labs  Lab 12/30/19 1800  INR 2.1*    Recent Labs    12/31/19 0444 01/01/20 0107  DDIMER 2.24* 1.96*    Cardiac Enzymes No results for input(s): CKMB, TROPONINI, MYOGLOBIN in the last 168 hours.  Invalid input(s): CK ------------------------------------------------------------------------------------------------------------------    Component Value Date/Time   BNP 427.0 (H) 12/30/2019 1800    Micro Results Recent Results (from the past 240 hour(s))  Culture, blood (routine x 2)     Status: None (Preliminary result)   Collection Time: 12/30/19  6:15 PM   Specimen: BLOOD RIGHT ARM  Result Value Ref Range Status   Specimen Description BLOOD RIGHT ARM  Final   Special Requests   Final    BOTTLES DRAWN AEROBIC AND ANAEROBIC Blood Culture adequate volume   Culture   Final    NO GROWTH 2  DAYS Performed at [facility], [place] [address], [place],  [address]    Report Status PENDING  Incomplete  Respiratory Panel by RT PCR (Flu A&B, Covid) - Nasopharyngeal Swab     Status: Abnormal   Collection Time: 12/30/19  6:24 PM   Specimen: Nasopharyngeal Swab  Result Value Ref Range Status   SARS Coronavirus 2 by RT PCR POSITIVE (A) NEGATIVE Final    Comment: RESULT CALLED TO, READ BACK BY AND VERIFIED WITH: L CHILTON RN 12/30/19 2036 JDW (NOTE) SARS-CoV-2 target nucleic acids are DETECTED. SARS-CoV-2 RNA is generally detectable in upper respiratory specimens  during the acute phase of infection. Positive results are indicative of the presence of the identified virus, but do not rule out bacterial infection or co-infection with other pathogens not detected by the test. Clinical correlation with patient history and other diagnostic information is necessary to determine patient infection status. The expected result is Negative. Fact Sheet for Patients:  PinkCheek.be Fact Sheet for Healthcare Providers: GravelBags.it  This test is not yet approved or cleared by the [place] and  has been authorized for detection and/or diagnosis of SARS-CoV-2 by FDA under an Emergency Use Authorization (EUA).  This EUA will remain in effect (meaning this test can be used) for the  duration of  the COVID-19 declaration under Section 564(b)(1) of the Act, 21 U.S.C. section 360bbb-3(b)(1), unless the authorization is terminated or revoked sooner.    Influenza A by PCR NEGATIVE NEGATIVE Final   Influenza B by PCR NEGATIVE NEGATIVE Final    Comment: (NOTE) The Xpert Xpress SARS-CoV-2/FLU/RSV assay is intended as an aid in  the diagnosis of influenza from Nasopharyngeal swab specimens and  should not be used as a sole basis for treatment. Nasal washings and  aspirates are unacceptable for Xpert Xpress SARS-CoV-2/FLU/RSV   testing. Fact Sheet for Patients: PinkCheek.be Fact Sheet for Healthcare Providers: GravelBags.it This test is not yet approved or cleared by the [place] FDA and  has been authorized for detection and/or diagnosis of SARS-CoV-2 by  FDA under an Emergency Use Authorization (EUA). This EUA will remain  in effect (meaning this test can be used) for the duration of the  Covid-19 declaration under Section 564(b)(1) of the Act, 21  U.S.C. section 360bbb-3(b)(1), unless the authorization is  terminated or revoked. Performed at [facility], [place] [address] [place], [place] [address]   Culture, blood (routine x 2)     Status: None (Preliminary result)   Collection Time: 12/30/19  7:00 PM   Specimen: BLOOD RIGHT ARM  Result Value Ref Range Status   Specimen Description BLOOD RIGHT ARM  Final   Special Requests   Final    BOTTLES DRAWN AEROBIC AND ANAEROBIC Blood Culture adequate volume   Culture   Final    NO GROWTH 2 DAYS Performed at [facility], [place] [address], [place], [place] [address]    Report Status PENDING  Incomplete    Radiology Reports DG Chest Port 1 View  Result Date: 12/30/2019 CLINICAL DATA:  Shortness of breath. EXAM: PORTABLE CHEST 1 VIEW COMPARISON:  November 26, 2017 FINDINGS: Moderate severity infiltrates are seen within bilateral lung bases, left slightly greater than right. Small bilateral pleural effusions are noted. There is no evidence of a pneumothorax. The cardiac silhouette is moderately enlarged. Radiopaque surgical clips are seen overlying the left axilla. These are present on the prior exam. Degenerative changes seen within the thoracic spine. IMPRESSION: 1. Moderate severity bibasilar infiltrates, left slightly greater than right. 2. Small bilateral pleural effusions. Electronically Signed   By: Virgina Norfolk M.D.   On: 12/30/2019 18:10

## 2020-01-02 DIAGNOSIS — E119 Type 2 diabetes mellitus without complications: Secondary | ICD-10-CM

## 2020-01-02 LAB — COMPREHENSIVE METABOLIC PANEL
ALT: 26 U/L (ref 0–44)
AST: 47 U/L — ABNORMAL HIGH (ref 15–41)
Albumin: 3.6 g/dL (ref 3.5–5.0)
Alkaline Phosphatase: 80 U/L (ref 38–126)
Anion gap: 9 (ref 5–15)
BUN: 23 mg/dL (ref 8–23)
CO2: 33 mmol/L — ABNORMAL HIGH (ref 22–32)
Calcium: 8.8 mg/dL — ABNORMAL LOW (ref 8.9–10.3)
Chloride: 96 mmol/L — ABNORMAL LOW (ref 98–111)
Creatinine, Ser: 1.09 mg/dL — ABNORMAL HIGH (ref 0.44–1.00)
GFR calc Af Amer: >60 mL/min (ref >60)
GFR calc non Af Amer: 53 mL/min — ABNORMAL LOW (ref >60)
Glucose, Bld: 236 mg/dL — ABNORMAL HIGH (ref 70–99)
Potassium: 4.4 mmol/L (ref 3.5–5.1)
Sodium: 138 mmol/L (ref 135–145)
Total Bilirubin: 1.1 mg/dL (ref 0.3–1.2)
Total Protein: 8.1 g/dL (ref 6.5–8.1)

## 2020-01-02 LAB — D-DIMER, QUANTITATIVE: D-Dimer, Quant: 1.76 ug/mL-FEU — ABNORMAL HIGH (ref 0.00–0.50)

## 2020-01-02 LAB — CBC WITH DIFFERENTIAL/PLATELET
Abs Immature Granulocytes: 0.02 10*3/uL (ref 0.00–0.07)
Basophils Absolute: 0 10*3/uL (ref 0.0–0.1)
Basophils Relative: 0 %
Eosinophils Absolute: 0 10*3/uL (ref 0.0–0.5)
Eosinophils Relative: 0 %
HCT: 30.7 % — ABNORMAL LOW (ref 36.0–46.0)
Hemoglobin: 9.5 g/dL — ABNORMAL LOW (ref 12.0–15.0)
Immature Granulocytes: 1 %
Lymphocytes Relative: 23 %
Lymphs Abs: 1 10*3/uL (ref 0.7–4.0)
MCH: 25.5 pg — ABNORMAL LOW (ref 26.0–34.0)
MCHC: 30.9 g/dL (ref 30.0–36.0)
MCV: 82.3 fL (ref 80.0–100.0)
Monocytes Absolute: 0.3 10*3/uL (ref 0.1–1.0)
Monocytes Relative: 8 %
Neutro Abs: 2.7 10*3/uL (ref 1.7–7.7)
Neutrophils Relative %: 68 %
Platelets: 188 10*3/uL (ref 150–400)
RBC: 3.73 MIL/uL — ABNORMAL LOW (ref 3.87–5.11)
RDW: 16.3 % — ABNORMAL HIGH (ref 11.5–15.5)
WBC: 4.1 10*3/uL (ref 4.0–10.5)
nRBC: 0 % (ref 0.0–0.2)

## 2020-01-02 LAB — GLUCOSE, CAPILLARY
Glucose-Capillary: 238 mg/dL — ABNORMAL HIGH (ref 70–99)
Glucose-Capillary: 333 mg/dL — ABNORMAL HIGH (ref 70–99)

## 2020-01-02 LAB — C-REACTIVE PROTEIN: CRP: 1.4 mg/dL — ABNORMAL HIGH (ref <1.0)

## 2020-01-02 LAB — MAGNESIUM: Magnesium: 2.1 mg/dL (ref 1.7–2.4)

## 2020-01-02 LAB — FERRITIN: Ferritin: 363 ng/mL — ABNORMAL HIGH (ref 11–307)

## 2020-01-02 MED ORDER — BLOOD GLUCOSE METER KIT: PACK | 0 refills | Status: DC

## 2020-01-02 MED ORDER — METFORMIN HCL 500 MG PO TABS: 500.0000 mg | ORAL_TABLET | Freq: Two times a day (BID) | ORAL | 0 refills | Status: DC

## 2020-01-02 MED ORDER — DEXAMETHASONE 4 MG PO TABS: 4.0000 mg | ORAL_TABLET | Freq: Every day | ORAL | 0 refills | Status: AC

## 2020-01-02 NOTE — Discharge Summary (Signed)
PATIENT DETAILS Name: Rita [name] Age: 64 y.o. Sex: female Date of Birth: [birth year] MRN: [id number]. Admitting Physician: Rita Cordia, MD TXH:[id number], Rita Crane., MD  Admit Date: 12/30/2019 Discharge date: 01/02/2020  Recommendations for Outpatient Follow-up:  1. Follow up with PCP in 1-2 weeks 2. Please obtain CMP/CBC in one week 3. Repeat Chest Xray in 4-6 week 4. Optimize diabetic regimen-started on Metformin  Admitted From:  Home  Disposition: Home with home health [place]: Yes  Equipment/Devices: Resume oxygen 2L   Discharge Condition: Stable  CODE STATUS: FULL CODE  Diet recommendation:  Diet Order            Diet - low sodium heart healthy        Diet Carb Modified        Diet heart healthy/carb modified Room service appropriate? Yes; Fluid consistency: Thin  Diet effective now               Brief Summary: See H&P, Labs, Consult and Test reports for all details in brief, Patient is a 64 y.o. female with PMHx of chronic diastolic heart failure, chronic hypoxic respiratory failure on home O2 2-3 L of oxygen, CKD stage III, A. fib on Xarelto, DM-2, HTN-presented to her PCPs office for evaluation of 1-2 weeks history of gradually worsening shortness of breath-she was sent to the ED by her PCP-and found to have acute hypoxemic respiratory failure secondary to COVID-19 pneumonia.   Brief Hospital Course: Acute on chronic hypoxic Resp Failure due to Covid 19 Viral pneumonia and decompensated diastolic heart failure:  Significantly better-Down to 2 L of oxygen which is her baseline home regimen.  Lower extremity edema significantly improved with intravenous Lasix (-4 L so far).  Since she is significantly improved-she will be discharged home today-and will complete remdesivir at the infusion center on 1/25 and 1/26.  Resume oral diuretics on discharge-will be placed on tapering steroids.    COVID-19 Labs:  Recent Labs    12/30/19 2147  12/30/19 2246 12/31/19 0444 01/01/20 0107 01/02/20 0311  DDIMER  --    < > 2.24* 1.96* 1.76*  FERRITIN 298   < > 281 287 363*  LDH 348*  --   --   --   --   CRP 2.8*   < > 2.9* 2.3* 1.4*   < > = values in this interval not displayed.    Lab Results  Component Value Date   SARSCOV2NAA POSITIVE (A) 12/30/2019     COVID-19 Medications: Steroids: 1/21>> Remdesivir: 1/21>>1/26  Minimally elevated troponin: Secondary demand ischemia-trend is flat-not consistent with ACS.  CKD stage IIIa: At baseline-monitor.  PAF: Continue Coreg and Xarelto.  HTN: Controlled-continue lisinopril and Coreg.  Follow and optimize.  DM-2:  CBG stable on SSI-A1c 7.6-resume glipizide on discharge.  GERD: Continue PPI  Obesity: Estimated body mass index is 53.11 kg/m as calculated from the following:   Height as of this encounter: '5\' 1"'$  (1.549 m).   Weight as of this encounter: 127.5 kg.    Procedures/Studies: None  Discharge Diagnoses:  Principal Problem:   Acute hypoxemic respiratory failure due to COVID-19 [facility]) Active Problems:   Diabetes mellitus without complication (HCC)   Acute on chronic diastolic CHF (congestive heart failure) (HCC)   AF (paroxysmal atrial fibrillation) ([place])   CKD (chronic kidney disease), stage III   Hypertension associated with diabetes [facility])   Discharge Instructions:    Person Under Monitoring Name: Rita [name]  Location: [place] [address]   Infection Prevention Recommendations for Individuals Confirmed to have, or Being Evaluated for, 2019 Novel Coronavirus (COVID-19) Infection Who Receive Care at Home  Individuals who are confirmed to have, or are being evaluated for, COVID-19 should follow the prevention steps below until a healthcare provider or local or state health department says they can return to normal activities.  Stay home except to get medical care You should restrict activities outside your home,  except for getting medical care. Do not go to work, school, or public areas, and do not use public transportation or taxis.  Call ahead before visiting your doctor Before your medical appointment, call the healthcare provider and tell them that you have, or are being evaluated for, COVID-19 infection. This will help the healthcare provider's office take steps to keep other people from getting infected. Ask your healthcare provider to call the local or state health department.  Monitor your symptoms Seek prompt medical attention if your illness is worsening (e.g., difficulty breathing). Before going to your medical appointment, call the healthcare provider and tell them that you have, or are being evaluated for, COVID-19 infection. Ask your healthcare provider to call the local or state health department.  Wear a facemask You should wear a facemask that covers your nose and mouth when you are in the same room with other people and when you visit a healthcare provider. People who live with or visit you should also wear a facemask while they are in the same room with you.  Separate yourself from other people in your home As much as possible, you should stay in a different room from other people in your home. Also, you should use a separate bathroom, if available.  Avoid sharing household items You should not share dishes, drinking glasses, cups, eating utensils, towels, bedding, or other items with other people in your home. After using these items, you should wash them thoroughly with soap and water.  Cover your coughs and sneezes Cover your mouth and nose with a tissue when you cough or sneeze, or you can cough or sneeze into your sleeve. Throw used tissues in a lined trash can, and immediately wash your hands with soap and water for at least 20 seconds or use an alcohol-based hand rub.  Wash your [facility] your hands often and thoroughly with soap and water for at least 20 seconds.  You can use an alcohol-based hand sanitizer if soap and water are not available and if your hands are not visibly dirty. Avoid touching your eyes, nose, and mouth with unwashed hands.   Prevention Steps for Caregivers and Household Members of Individuals Confirmed to have, or Being Evaluated for, COVID-19 Infection Being Cared for in the Home  If you live with, or provide care at home for, a person confirmed to have, or being evaluated for, COVID-19 infection please follow these guidelines to prevent infection:  Follow healthcare provider's instructions Make sure that you understand and can help the patient follow any healthcare provider instructions for all care.  Provide for the patient's basic needs You should help the patient with basic needs in the home and provide support for getting groceries, prescriptions, and other personal needs.  Monitor the patient's symptoms If they are getting sicker, call his or her medical provider and tell them that the patient has, or is being evaluated for, COVID-19 infection. This will help the healthcare provider's office take steps to keep other people from  getting infected. Ask the healthcare provider to call the local or state health department.  Limit the number of people who have contact with the patient  If possible, have only one caregiver for the patient.  Other household members should stay in another home or place of residence. If this is not possible, they should stay  in another room, or be separated from the patient as much as possible. Use a separate bathroom, if available.  Restrict visitors who do not have an essential need to be in the home.  Keep older adults, very young children, and other sick people away from the patient Keep older adults, very young children, and those who have compromised immune systems or chronic health conditions away from the patient. This includes people with chronic heart, lung, or kidney conditions,  diabetes, and cancer.  Ensure good ventilation Make sure that shared spaces in the home have good air flow, such as from an air conditioner or an opened window, weather permitting.  Wash your hands often  Wash your hands often and thoroughly with soap and water for at least 20 seconds. You can use an alcohol based hand sanitizer if soap and water are not available and if your hands are not visibly dirty.  Avoid touching your eyes, nose, and mouth with unwashed hands.  Use disposable paper towels to dry your hands. If not available, use dedicated cloth towels and replace them when they become wet.  Wear a facemask and gloves  Wear a disposable facemask at all times in the room and gloves when you touch or have contact with the patient's blood, body fluids, and/or secretions or excretions, such as sweat, saliva, sputum, nasal mucus, vomit, urine, or feces.  Ensure the mask fits over your nose and mouth tightly, and do not touch it during use.  Throw out disposable facemasks and gloves after using them. Do not reuse.  Wash your hands immediately after removing your facemask and gloves.  If your personal clothing becomes contaminated, carefully remove clothing and launder. Wash your hands after handling contaminated clothing.  Place all used disposable facemasks, gloves, and other waste in a lined container before disposing them with other household waste.  Remove gloves and wash your hands immediately after handling these items.  Do not share dishes, glasses, or other household items with the patient  Avoid sharing household items. You should not share dishes, drinking glasses, cups, eating utensils, towels, bedding, or other items with a patient who is confirmed to have, or being evaluated for, COVID-19 infection.  After the person uses these items, you should wash them thoroughly with soap and water.  Wash laundry thoroughly  Immediately remove and wash clothes or bedding that have  blood, body fluids, and/or secretions or excretions, such as sweat, saliva, sputum, nasal mucus, vomit, urine, or feces, on them.  Wear gloves when handling laundry from the patient.  Read and follow directions on labels of laundry or clothing items and detergent. In general, wash and dry with the warmest temperatures recommended on the label.  Clean all areas the individual has used often  Clean all touchable surfaces, such as counters, tabletops, doorknobs, bathroom fixtures, toilets, phones, keyboards, tablets, and bedside tables, every day. Also, clean any surfaces that may have blood, body fluids, and/or secretions or excretions on them.  Wear gloves when cleaning surfaces the patient has come in contact with.  Use a diluted bleach solution (e.g., dilute bleach with 1 part bleach and 10 parts water) or a  household disinfectant with a label that says EPA-registered for coronaviruses. To make a bleach solution at home, add 1 tablespoon of bleach to 1 quart (4 cups) of water. For a larger supply, add  cup of bleach to 1 gallon (16 cups) of water.  Read labels of cleaning products and follow recommendations provided on product labels. Labels contain instructions for safe and effective use of the cleaning product including precautions you should take when applying the product, such as wearing gloves or eye protection and making sure you have good ventilation during use of the product.  Remove gloves and wash hands immediately after cleaning.  Monitor yourself for signs and symptoms of illness Caregivers and household members are considered close contacts, should monitor their health, and will be asked to limit movement outside of the home to the extent possible. Follow the monitoring steps for close contacts listed on the symptom monitoring form.   ? If you have additional questions, contact your local health department or call the epidemiologist on call at [phone] (available 24/7). ?  This guidance is subject to change. For the most up-to-date guidance from CDC, please refer to their website: YouBlogs.pl    Activity:  As tolerated with Full fall precautions use walker/cane & assistance as needed  Discharge Instructions    (HEART FAILURE PATIENTS) Call MD:  Anytime you have any of the following symptoms: 1) 3 pound weight gain in 24 hours or 5 pounds in 1 week 2) shortness of breath, with or without a dry hacking cough 3) swelling in the hands, feet or stomach 4) if you have to sleep on extra pillows at night in order to breathe.   Complete by: As directed    Call MD for:  difficulty breathing, headache or visual disturbances   Complete by: As directed    Call MD for:  extreme fatigue   Complete by: As directed    Diet - low sodium heart healthy   Complete by: As directed    Diet Carb Modified   Complete by: As directed    Discharge instructions   Complete by: As directed    Follow with Primary MD  [name], MD in 1-2 weeks  Please get a complete blood count and chemistry panel checked by your Primary MD at your next visit, and again as instructed by your Primary MD.  Get Medicines reviewed and adjusted: Please take all your medications with you for your next visit with your Primary MD  Laboratory/radiological data: Please request your Primary MD to go over all hospital tests and procedure/radiological results at the follow up, please ask your Primary MD to get all Hospital records sent to his/her office.  In some cases, they will be blood work, cultures and biopsy results pending at the time of your discharge. Please request that your primary care M.D. follows up on these results.  Also Note the following: If you experience worsening of your admission symptoms, develop shortness of breath, life threatening emergency, suicidal or homicidal thoughts you must seek medical attention immediately by  calling 911 or calling your MD immediately  if symptoms less severe.  You must read complete instructions/literature along with all the possible adverse reactions/side effects for all the Medicines you take and that have been prescribed to you. Take any new Medicines after you have completely understood and accpet all the possible adverse reactions/side effects.   Do not drive when taking Pain medications or sleeping medications (Benzodaizepines)  Do not take more than  prescribed Pain, Sleep and Anxiety Medications. It is not advisable to combine anxiety,sleep and pain medications without talking with your primary care practitioner  Special Instructions: If you have smoked or chewed Tobacco  in the last 2 yrs please stop smoking, stop any regular Alcohol  and or any Recreational drug use.  Wear Seat belts while driving.  Please note: You were cared for by a hospitalist during your hospital stay. Once you are discharged, your primary care physician will handle any further medical issues. Please note that NO REFILLS for any discharge medications will be authorized once you are discharged, as it is imperative that you return to your primary care physician (or establish a relationship with a primary care physician if you do not have one) for your post hospital discharge needs so that they can reassess your need for medications and monitor your lab values.   1.)You are scheduled for an outpatient infusion of Remdesivir at 10:00 am on Monday 1/25 and Tuesday 1/26.  Please report to [facility] at [address]  Drive to the security guard and tell them you are here for an infusion. They will direct you to the front entrance where we will come and get you.  For questions call [phone].  Thanks   2.)  3 weeks of isolation from 12/30/2019.  3.)  Please check your sugars multiple times a day-keep a record of these readings and take it to your PCPs office at your next visit.   Increase  activity slowly   Complete by: As directed      Allergies as of 01/02/2020      Reactions   Codeine    Mouth sores    Prednisone    Delusions    Moxifloxacin Rash      Medication List    TAKE these medications   allopurinol 100 MG tablet Commonly known as: ZYLOPRIM Take 100 mg by mouth daily.   blood glucose meter kit and supplies Dispense based on patient and insurance preference. Use up to four times daily as directed. (FOR ICD-10 E10.9, E11.9).   carvedilol 25 MG tablet Commonly known as: COREG Take 25 mg by mouth 2 (two) times daily with a meal.   dexamethasone 4 MG tablet Commonly known as: DECADRON Take 1 tablet (4 mg total) by mouth daily for 2 days.   ferrous sulfate 325 (65 FE) MG tablet Take 325 mg by mouth daily with breakfast.   furosemide 40 MG tablet Commonly known as: LASIX Take 40 mg by mouth.   glipiZIDE 10 MG 24 hr tablet Commonly known as: GLUCOTROL XL Take 10 mg by mouth daily with breakfast.   lisinopril 20 MG tablet Commonly known as: ZESTRIL Take 20 mg by mouth daily.   pantoprazole 40 MG tablet Commonly known as: PROTONIX Take 40 mg by mouth daily.   polyethylene glycol 17 g packet Commonly known as: MIRALAX / GLYCOLAX Take 17 g by mouth daily.   potassium chloride SA 20 MEQ tablet Commonly known as: KLOR-CON Take 20 mEq by mouth daily.   rivaroxaban 20 MG Tabs tablet Commonly known as: XARELTO Take 20 mg by mouth daily with supper.            Durable Medical Equipment  (From admission, onward)         Start     Ordered   01/01/20 1107  DME Walker rolling  (Discharge Planning)  Once    Question Answer Comment  Walker: With 5  Inch Wheels   Patient needs a walker to treat with the following condition Physical deconditioning      01/01/20 1107         Follow-up Information    [name], MD. Schedule an appointment as soon as possible for a visit in 1 week(s).   Specialty: Internal Medicine Contact  information: [place] [address] [phone]          Allergies  Allergen Reactions  . Codeine     Mouth sores   . Prednisone     Delusions   . Moxifloxacin Rash    Consultations:   None   Other Procedures/Studies: DG Chest Port 1 View  Result Date: 12/30/2019 CLINICAL DATA:  Shortness of breath. EXAM: PORTABLE CHEST 1 VIEW COMPARISON:  November 26, 2017 FINDINGS: Moderate severity infiltrates are seen within bilateral lung bases, left slightly greater than right. Small bilateral pleural effusions are noted. There is no evidence of a pneumothorax. The cardiac silhouette is moderately enlarged. Radiopaque surgical clips are seen overlying the left axilla. These are present on the prior exam. Degenerative changes seen within the thoracic spine. IMPRESSION: 1. Moderate severity bibasilar infiltrates, left slightly greater than right. 2. Small bilateral pleural effusions. Electronically Signed   By: Virgina Norfolk M.D.   On: 12/30/2019 18:10     TODAY-DAY OF DISCHARGE:  Subjective:   Rita [name] today has no headache,no chest abdominal pain,no new weakness tingling or numbness, feels much better wants to go home today.   Objective:   Blood pressure (!) 160/75, pulse (!) 50, temperature 97.6 F (36.4 C), temperature source Axillary, resp. rate 15, height '5\' 1"'$  (1.549 m), weight 127.5 kg, SpO2 97 %.  Intake/Output Summary (Last 24 hours) at 01/02/2020 1048 Last data filed at 01/02/2020 0700 Gross per 24 hour  Intake 250 ml  Output 1975 ml  Net -1725 ml   Filed Weights   12/31/19 0900  Weight: 127.5 kg    Exam: Awake Alert, Oriented *3, No new F.N deficits, Normal affect [place].AT,PERRAL Supple Neck,No JVD, No cervical lymphadenopathy appriciated.  Symmetrical Chest wall movement, Good air movement bilaterally, CTAB RRR,No Gallops,Rubs or new Murmurs, No Parasternal Heave +ve B.Sounds, Abd Soft, Non tender, No organomegaly appriciated, No rebound  -guarding or rigidity. No Cyanosis, Clubbing or edema, No new Rash or bruise   PERTINENT RADIOLOGIC STUDIES: DG Chest Port 1 View  Result Date: 12/30/2019 CLINICAL DATA:  Shortness of breath. EXAM: PORTABLE CHEST 1 VIEW COMPARISON:  November 26, 2017 FINDINGS: Moderate severity infiltrates are seen within bilateral lung bases, left slightly greater than right. Small bilateral pleural effusions are noted. There is no evidence of a pneumothorax. The cardiac silhouette is moderately enlarged. Radiopaque surgical clips are seen overlying the left axilla. These are present on the prior exam. Degenerative changes seen within the thoracic spine. IMPRESSION: 1. Moderate severity bibasilar infiltrates, left slightly greater than right. 2. Small bilateral pleural effusions. Electronically Signed   By: Virgina Norfolk M.D.   On: 12/30/2019 18:10     PERTINENT LAB RESULTS: CBC: Recent Labs    01/01/20 0107 01/02/20 0311  WBC 2.5* 4.1  HGB 9.4* 9.5*  HCT 29.7* 30.7*  PLT 164 188   CMET CMP     Component Value Date/Time   NA 138 01/02/2020 0311   K 4.4 01/02/2020 0311   CL 96 (L) 01/02/2020 0311   CO2 33 (H) 01/02/2020 0311   GLUCOSE 236 (H) 01/02/2020 0311   BUN  23 01/02/2020 0311   CREATININE 1.09 (H) 01/02/2020 0311   CALCIUM 8.8 (L) 01/02/2020 0311   PROT 8.1 01/02/2020 0311   ALBUMIN 3.6 01/02/2020 0311   AST 47 (H) 01/02/2020 0311   ALT 26 01/02/2020 0311   ALKPHOS 80 01/02/2020 0311   BILITOT 1.1 01/02/2020 0311   GFRNONAA 53 (L) 01/02/2020 0311   GFRAA >60 01/02/2020 0311    GFR Estimated Creatinine Clearance: 64.7 mL/min (A) (by C-G formula based on SCr of 1.09 mg/dL (H)). No results for input(s): LIPASE, AMYLASE in the last 72 hours. No results for input(s): CKTOTAL, CKMB, CKMBINDEX, TROPONINI in the last 72 hours. Invalid input(s): POCBNP Recent Labs    01/01/20 0107 01/02/20 0311  DDIMER 1.96* 1.76*   Recent Labs    12/31/19 0508  HGBA1C 7.6*   No results  for input(s): CHOL, HDL, LDLCALC, TRIG, CHOLHDL, LDLDIRECT in the last 72 hours. No results for input(s): TSH, T4TOTAL, T3FREE, THYROIDAB in the last 72 hours.  Invalid input(s): FREET3 Recent Labs    01/01/20 0107 01/02/20 0311  FERRITIN 287 363*   Coags: Recent Labs    12/30/19 1800  INR 2.1*   Microbiology: Recent Results (from the past 240 hour(s))  Culture, blood (routine x 2)     Status: None (Preliminary result)   Collection Time: 12/30/19  6:15 PM   Specimen: BLOOD RIGHT ARM  Result Value Ref Range Status   Specimen Description BLOOD RIGHT ARM  Final   Special Requests   Final    BOTTLES DRAWN AEROBIC AND ANAEROBIC Blood Culture adequate volume   Culture   Final    NO GROWTH 3 DAYS Performed at [facility], [place] [address], [place], [place] [address]    Report Status PENDING  Incomplete  Respiratory Panel by RT PCR (Flu A&B, Covid) - Nasopharyngeal Swab     Status: Abnormal   Collection Time: 12/30/19  6:24 PM   Specimen: Nasopharyngeal Swab  Result Value Ref Range Status   SARS Coronavirus 2 by RT PCR POSITIVE (A) NEGATIVE Final    Comment: RESULT CALLED TO, READ BACK BY AND VERIFIED WITH: L CHILTON RN 12/30/19 2036 JDW (NOTE) SARS-CoV-2 target nucleic acids are DETECTED. SARS-CoV-2 RNA is generally detectable in upper respiratory specimens  during the acute phase of infection. Positive results are indicative of the presence of the identified virus, but do not rule out bacterial infection or co-infection with other pathogens not detected by the test. Clinical correlation with patient history and other diagnostic information is necessary to determine patient infection status. The expected result is Negative. Fact Sheet for Patients:  PinkCheek.be Fact Sheet for Healthcare Providers: GravelBags.it This test is not yet approved or cleared by the [place] FDA and  has been authorized for  detection and/or diagnosis of SARS-CoV-2 by FDA under an Emergency Use Authorization (EUA).  This EUA will remain in effect (meaning this test can be used) for the  duration of  the COVID-19 declaration under Section 564(b)(1) of the Act, 21 U.S.C. section 360bbb-3(b)(1), unless the authorization is terminated or revoked sooner.    Influenza A by PCR NEGATIVE NEGATIVE Final   Influenza B by PCR NEGATIVE NEGATIVE Final    Comment: (NOTE) The Xpert Xpress SARS-CoV-2/FLU/RSV assay is intended as an aid in  the diagnosis of influenza from Nasopharyngeal swab specimens and  should not be used as a sole basis for treatment. Nasal washings and  aspirates are unacceptable for Xpert Xpress SARS-CoV-2/FLU/RSV  testing.  Fact Sheet for Patients: PinkCheek.be Fact Sheet for Healthcare Providers: GravelBags.it This test is not yet approved or cleared by the [place] FDA and  has been authorized for detection and/or diagnosis of SARS-CoV-2 by  FDA under an Emergency Use Authorization (EUA). This EUA will remain  in effect (meaning this test can be used) for the duration of the  Covid-19 declaration under Section 564(b)(1) of the Act, 21  U.S.C. section 360bbb-3(b)(1), unless the authorization is  terminated or revoked. Performed at [facility], [place] [address], [place], [place] [address]   Culture, blood (routine x 2)     Status: None (Preliminary result)   Collection Time: 12/30/19  7:00 PM   Specimen: BLOOD RIGHT ARM  Result Value Ref Range Status   Specimen Description BLOOD RIGHT ARM  Final   Special Requests   Final    BOTTLES DRAWN AEROBIC AND ANAEROBIC Blood Culture adequate volume   Culture   Final    NO GROWTH 3 DAYS Performed at [facility], [address] [place], [place] [address]    Report Status PENDING  Incomplete    FURTHER DISCHARGE INSTRUCTIONS:  Get Medicines reviewed and adjusted: Please  take all your medications with you for your next visit with your Primary MD  Laboratory/radiological data: Please request your Primary MD to go over all hospital tests and procedure/radiological results at the follow up, please ask your Primary MD to get all Hospital records sent to his/her office.  In some cases, they will be blood work, cultures and biopsy results pending at the time of your discharge. Please request that your primary care M.D. goes through all the records of your hospital data and follows up on these results.  Also Note the following: If you experience worsening of your admission symptoms, develop shortness of breath, life threatening emergency, suicidal or homicidal thoughts you must seek medical attention immediately by calling 911 or calling your MD immediately  if symptoms less severe.  You must read complete instructions/literature along with all the possible adverse reactions/side effects for all the Medicines you take and that have been prescribed to you. Take any new Medicines after you have completely understood and accpet all the possible adverse reactions/side effects.   Do not drive when taking Pain medications or sleeping medications (Benzodaizepines)  Do not take more than prescribed Pain, Sleep and Anxiety Medications. It is not advisable to combine anxiety,sleep and pain medications without talking with your primary care practitioner  Special Instructions: If you have smoked or chewed Tobacco  in the last 2 yrs please stop smoking, stop any regular Alcohol  and or any Recreational drug use.  Wear Seat belts while driving.  Please note: You were cared for by a hospitalist during your hospital stay. Once you are discharged, your primary care physician will handle any further medical issues. Please note that NO REFILLS for any discharge medications will be authorized once you are discharged, as it is imperative that you return to your primary care physician (or  establish a relationship with a primary care physician if you do not have one) for your post hospital discharge needs so that they can reassess your need for medications and monitor your lab values.  Total Time spent coordinating discharge including counseling, education and face to face time equals 35 minutes.  SignedOren Binet 01/02/2020 10:48 AM

## 2020-01-02 NOTE — TOC Transition Note (Signed)
Transition of Care [facility]) - CM/SW Discharge Note   Patient Details  Name: Rita [name] MRN: [id number] Date of Birth: Mar 11, 1956  [facility] CM/SW Contact:  Atilano Median, LCSW Phone Number: 01/02/2020, 11:44 AM   Clinical Narrative:    Discharged home with potential home health. Due to patient's payor source [facility] is checking with branch director to see if they are able to service patient. Patient is aware that she may not get home health services.   Patient states that she was previously on oxygen PTA and she has her own portable oxygen tank with her. She also declined the need for CSW to order DME stating that she would order it from the company that she gets her oxygen from.   Patient's husband will provide transportation home. No other needs at this time. Case closed to this CSW.    Final next level of care: [place] Barriers to Discharge: Barriers Resolved   Patient Goals and CMS Choice        Discharge Placement                       Discharge Plan and Services                          HH Arranged: PT [place] Agency: [facility] Date [place]: 01/02/20 Time [place]: [place] Representative spoke with at [place]: Cassie  Social Determinants of Health ([place]) Interventions     Readmission Risk Interventions No flowsheet data found.

## 2020-01-02 NOTE — Progress Notes (Signed)
Pt d/c with all personal belonging ( cane, 02, dentures , phone ,charger and clothes ) Pt verbalized understanding of D/C instructions. All concerns addressed at bedside

## 2020-01-03 ENCOUNTER — Ambulatory Visit (HOSPITAL_COMMUNITY)
Admission: RE | Admit: 2020-01-03 | Discharge: 2020-01-03 | Disposition: A | Discharge status: Home / Self Care | Payer: PPO | Source: Ambulatory Visit | Attending: Pulmonary Disease | Admitting: Pulmonary Disease

## 2020-01-03 DIAGNOSIS — U071 COVID-19: Secondary | ICD-10-CM | POA: Diagnosis not present

## 2020-01-03 MED ORDER — SODIUM CHLORIDE 0.9 % IV SOLN
INTRAVENOUS | Status: AC
  Filled 2020-01-03: qty 20

## 2020-01-03 MED ORDER — EPINEPHRINE 0.3 MG/0.3ML IJ SOAJ: 0.3000 mg | Freq: Once | INTRAMUSCULAR | Status: DC | PRN

## 2020-01-03 MED ORDER — SODIUM CHLORIDE 0.9 % IV SOLN: 100.0000 mg | Freq: Once | INTRAVENOUS | Status: DC

## 2020-01-03 MED ORDER — FAMOTIDINE IN NACL 20-0.9 MG/50ML-% IV SOLN: 20.0000 mg | Freq: Once | INTRAVENOUS | Status: DC | PRN

## 2020-01-03 MED ORDER — SODIUM CHLORIDE 0.9 % IV SOLN: INTRAVENOUS | Status: DC | PRN

## 2020-01-03 MED ORDER — DIPHENHYDRAMINE HCL 50 MG/ML IJ SOLN: 50.0000 mg | Freq: Once | INTRAMUSCULAR | Status: DC | PRN

## 2020-01-03 MED ORDER — METHYLPREDNISOLONE SODIUM SUCC 125 MG IJ SOLR: 125.0000 mg | Freq: Once | INTRAMUSCULAR | Status: DC | PRN

## 2020-01-03 MED ORDER — ALBUTEROL SULFATE HFA 108 (90 BASE) MCG/ACT IN AERS: 2.0000 | INHALATION_SPRAY | Freq: Once | RESPIRATORY_TRACT | Status: DC | PRN

## 2020-01-03 NOTE — Progress Notes (Signed)
Patient states she will take bp meds as soon as she arrives home

## 2020-01-03 NOTE — Progress Notes (Signed)
Patient ID: Rita [name], female   DOB: 1956-07-02, 64 y.o.   MRN: [id number]  Diagnosis: [id number]  Physician:dr wright  Procedure: [facility] Med: remdesivir infusion.  Complications: No immediate complications noted.  Discharge: Discharged home   Heide Scales 01/03/2020

## 2020-01-04 ENCOUNTER — Ambulatory Visit (HOSPITAL_COMMUNITY)
Admit: 2020-01-04 | Discharge: 2020-01-04 | Disposition: A | Discharge status: Home / Self Care | Payer: PPO | Attending: Pulmonary Disease | Admitting: Pulmonary Disease

## 2020-01-04 DIAGNOSIS — U071 COVID-19: Secondary | ICD-10-CM | POA: Diagnosis not present

## 2020-01-04 LAB — CULTURE, BLOOD (ROUTINE X 2)
Culture: NO GROWTH
Culture: NO GROWTH
Special Requests: ADEQUATE
Special Requests: ADEQUATE

## 2020-01-04 MED ORDER — METHYLPREDNISOLONE SODIUM SUCC 125 MG IJ SOLR: 125.0000 mg | Freq: Once | INTRAMUSCULAR | Status: DC | PRN

## 2020-01-04 MED ORDER — DIPHENHYDRAMINE HCL 50 MG/ML IJ SOLN: 50.0000 mg | Freq: Once | INTRAMUSCULAR | Status: DC | PRN

## 2020-01-04 MED ORDER — SODIUM CHLORIDE 0.9 % IV SOLN: INTRAVENOUS | Status: DC | PRN

## 2020-01-04 MED ORDER — FAMOTIDINE IN NACL 20-0.9 MG/50ML-% IV SOLN: 20.0000 mg | Freq: Once | INTRAVENOUS | Status: DC | PRN

## 2020-01-04 MED ORDER — SODIUM CHLORIDE 0.9 % IV SOLN
INTRAVENOUS | Status: AC
  Filled 2020-01-04: qty 20

## 2020-01-04 MED ORDER — ALBUTEROL SULFATE HFA 108 (90 BASE) MCG/ACT IN AERS: 2.0000 | INHALATION_SPRAY | Freq: Once | RESPIRATORY_TRACT | Status: DC | PRN

## 2020-01-04 MED ORDER — EPINEPHRINE 0.3 MG/0.3ML IJ SOAJ: 0.3000 mg | Freq: Once | INTRAMUSCULAR | Status: DC | PRN

## 2020-01-04 MED ORDER — SODIUM CHLORIDE 0.9 % IV SOLN
100.0000 mg | Freq: Once | INTRAVENOUS | Status: AC
  Administered 2020-01-04: 100 mg via INTRAVENOUS

## 2020-01-04 NOTE — Progress Notes (Signed)
  Diagnosis: COVID-19  Physician: Joya Gaskins  Procedure: [facility] Infusion Clinic Med: remdesivir infusion.  Complications: No immediate complications noted.  Discharge: Discharged home   Rita [name] 01/04/2020

## 2020-01-13 DIAGNOSIS — I248 Other forms of acute ischemic heart disease: Secondary | ICD-10-CM | POA: Diagnosis not present

## 2020-01-13 DIAGNOSIS — I4819 Other persistent atrial fibrillation: Secondary | ICD-10-CM | POA: Diagnosis not present

## 2020-01-13 DIAGNOSIS — J9611 Chronic respiratory failure with hypoxia: Secondary | ICD-10-CM | POA: Diagnosis not present

## 2020-01-13 DIAGNOSIS — D631 Anemia in chronic kidney disease: Secondary | ICD-10-CM | POA: Diagnosis not present

## 2020-01-13 DIAGNOSIS — I5032 Chronic diastolic (congestive) heart failure: Secondary | ICD-10-CM | POA: Diagnosis not present

## 2020-01-13 DIAGNOSIS — N183 Chronic kidney disease, stage 3 unspecified: Secondary | ICD-10-CM | POA: Diagnosis not present

## 2020-01-13 DIAGNOSIS — U071 COVID-19: Secondary | ICD-10-CM | POA: Diagnosis not present

## 2020-01-13 DIAGNOSIS — J1282 Pneumonia due to coronavirus disease 2019: Secondary | ICD-10-CM | POA: Diagnosis not present

## 2020-01-17 DIAGNOSIS — D649 Anemia, unspecified: Secondary | ICD-10-CM | POA: Diagnosis not present

## 2020-01-24 DIAGNOSIS — D649 Anemia, unspecified: Secondary | ICD-10-CM

## 2020-02-07 HISTORY — PX: UPPER GI ENDOSCOPY: SHX6162

## 2020-02-07 HISTORY — PX: COLONOSCOPY: SHX174

## 2020-02-08 DIAGNOSIS — U071 COVID-19: Secondary | ICD-10-CM | POA: Diagnosis not present

## 2020-02-08 DIAGNOSIS — I10 Essential (primary) hypertension: Secondary | ICD-10-CM | POA: Diagnosis not present

## 2020-02-08 DIAGNOSIS — Z7901 Long term (current) use of anticoagulants: Secondary | ICD-10-CM | POA: Diagnosis not present

## 2020-02-08 DIAGNOSIS — Z6841 Body Mass Index (BMI) 40.0 and over, adult: Secondary | ICD-10-CM | POA: Diagnosis not present

## 2020-02-08 DIAGNOSIS — E782 Mixed hyperlipidemia: Secondary | ICD-10-CM | POA: Diagnosis not present

## 2020-02-08 DIAGNOSIS — I272 Pulmonary hypertension, unspecified: Secondary | ICD-10-CM | POA: Diagnosis not present

## 2020-02-08 DIAGNOSIS — I872 Venous insufficiency (chronic) (peripheral): Secondary | ICD-10-CM | POA: Diagnosis not present

## 2020-02-08 DIAGNOSIS — I4819 Other persistent atrial fibrillation: Secondary | ICD-10-CM | POA: Diagnosis not present

## 2020-02-10 DIAGNOSIS — I509 Heart failure, unspecified: Secondary | ICD-10-CM | POA: Diagnosis not present

## 2020-02-23 DIAGNOSIS — M1712 Unilateral primary osteoarthritis, left knee: Secondary | ICD-10-CM | POA: Diagnosis not present

## 2020-02-28 DIAGNOSIS — D649 Anemia, unspecified: Secondary | ICD-10-CM | POA: Diagnosis not present

## 2020-03-06 DIAGNOSIS — I517 Cardiomegaly: Secondary | ICD-10-CM | POA: Diagnosis not present

## 2020-03-06 DIAGNOSIS — D62 Acute posthemorrhagic anemia: Secondary | ICD-10-CM | POA: Diagnosis not present

## 2020-03-06 DIAGNOSIS — K921 Melena: Secondary | ICD-10-CM | POA: Diagnosis not present

## 2020-03-06 DIAGNOSIS — D600 Chronic acquired pure red cell aplasia: Secondary | ICD-10-CM | POA: Diagnosis not present

## 2020-03-07 DIAGNOSIS — I130 Hypertensive heart and chronic kidney disease with heart failure and stage 1 through stage 4 chronic kidney disease, or unspecified chronic kidney disease: Secondary | ICD-10-CM | POA: Diagnosis not present

## 2020-03-07 DIAGNOSIS — K922 Gastrointestinal hemorrhage, unspecified: Secondary | ICD-10-CM | POA: Diagnosis not present

## 2020-03-07 DIAGNOSIS — I110 Hypertensive heart disease with heart failure: Secondary | ICD-10-CM | POA: Diagnosis not present

## 2020-03-07 DIAGNOSIS — I5032 Chronic diastolic (congestive) heart failure: Secondary | ICD-10-CM | POA: Diagnosis not present

## 2020-03-07 DIAGNOSIS — D62 Acute posthemorrhagic anemia: Secondary | ICD-10-CM | POA: Diagnosis not present

## 2020-03-07 DIAGNOSIS — M109 Gout, unspecified: Secondary | ICD-10-CM | POA: Diagnosis not present

## 2020-03-07 DIAGNOSIS — Z79899 Other long term (current) drug therapy: Secondary | ICD-10-CM | POA: Diagnosis not present

## 2020-03-07 DIAGNOSIS — N183 Chronic kidney disease, stage 3 unspecified: Secondary | ICD-10-CM | POA: Diagnosis not present

## 2020-03-07 DIAGNOSIS — Z7984 Long term (current) use of oral hypoglycemic drugs: Secondary | ICD-10-CM | POA: Diagnosis not present

## 2020-03-07 DIAGNOSIS — J9611 Chronic respiratory failure with hypoxia: Secondary | ICD-10-CM | POA: Diagnosis not present

## 2020-03-07 DIAGNOSIS — Z888 Allergy status to other drugs, medicaments and biological substances status: Secondary | ICD-10-CM | POA: Diagnosis not present

## 2020-03-07 DIAGNOSIS — M170 Bilateral primary osteoarthritis of knee: Secondary | ICD-10-CM | POA: Diagnosis not present

## 2020-03-07 DIAGNOSIS — Z885 Allergy status to narcotic agent status: Secondary | ICD-10-CM | POA: Diagnosis not present

## 2020-03-07 DIAGNOSIS — Z6841 Body Mass Index (BMI) 40.0 and over, adult: Secondary | ICD-10-CM | POA: Diagnosis not present

## 2020-03-07 DIAGNOSIS — K921 Melena: Secondary | ICD-10-CM | POA: Diagnosis not present

## 2020-03-07 DIAGNOSIS — E119 Type 2 diabetes mellitus without complications: Secondary | ICD-10-CM | POA: Diagnosis not present

## 2020-03-07 DIAGNOSIS — D649 Anemia, unspecified: Secondary | ICD-10-CM | POA: Diagnosis not present

## 2020-03-07 DIAGNOSIS — Z86711 Personal history of pulmonary embolism: Secondary | ICD-10-CM | POA: Diagnosis not present

## 2020-03-07 DIAGNOSIS — Z7901 Long term (current) use of anticoagulants: Secondary | ICD-10-CM | POA: Diagnosis not present

## 2020-03-07 DIAGNOSIS — I480 Paroxysmal atrial fibrillation: Secondary | ICD-10-CM | POA: Diagnosis not present

## 2020-03-07 DIAGNOSIS — I509 Heart failure, unspecified: Secondary | ICD-10-CM | POA: Diagnosis not present

## 2020-03-07 DIAGNOSIS — M1990 Unspecified osteoarthritis, unspecified site: Secondary | ICD-10-CM | POA: Diagnosis not present

## 2020-03-12 DIAGNOSIS — I509 Heart failure, unspecified: Secondary | ICD-10-CM | POA: Diagnosis not present

## 2020-03-14 DIAGNOSIS — R5381 Other malaise: Secondary | ICD-10-CM | POA: Diagnosis not present

## 2020-03-14 DIAGNOSIS — D631 Anemia in chronic kidney disease: Secondary | ICD-10-CM | POA: Diagnosis not present

## 2020-03-14 DIAGNOSIS — N183 Chronic kidney disease, stage 3 unspecified: Secondary | ICD-10-CM | POA: Diagnosis not present

## 2020-03-14 DIAGNOSIS — I5032 Chronic diastolic (congestive) heart failure: Secondary | ICD-10-CM | POA: Diagnosis not present

## 2020-03-14 DIAGNOSIS — E782 Mixed hyperlipidemia: Secondary | ICD-10-CM | POA: Diagnosis not present

## 2020-03-14 DIAGNOSIS — E1122 Type 2 diabetes mellitus with diabetic chronic kidney disease: Secondary | ICD-10-CM | POA: Diagnosis not present

## 2020-03-14 DIAGNOSIS — I130 Hypertensive heart and chronic kidney disease with heart failure and stage 1 through stage 4 chronic kidney disease, or unspecified chronic kidney disease: Secondary | ICD-10-CM | POA: Diagnosis not present

## 2020-03-14 DIAGNOSIS — R5383 Other fatigue: Secondary | ICD-10-CM | POA: Diagnosis not present

## 2020-03-14 DIAGNOSIS — I10 Essential (primary) hypertension: Secondary | ICD-10-CM | POA: Diagnosis not present

## 2020-03-14 DIAGNOSIS — I272 Pulmonary hypertension, unspecified: Secondary | ICD-10-CM | POA: Diagnosis not present

## 2020-03-14 DIAGNOSIS — I4819 Other persistent atrial fibrillation: Secondary | ICD-10-CM | POA: Diagnosis not present

## 2020-03-17 DIAGNOSIS — D649 Anemia, unspecified: Secondary | ICD-10-CM | POA: Diagnosis not present

## 2020-03-20 DIAGNOSIS — M1712 Unilateral primary osteoarthritis, left knee: Secondary | ICD-10-CM | POA: Diagnosis not present

## 2020-03-27 DIAGNOSIS — N183 Chronic kidney disease, stage 3 unspecified: Secondary | ICD-10-CM | POA: Diagnosis not present

## 2020-03-27 DIAGNOSIS — I959 Hypotension, unspecified: Secondary | ICD-10-CM | POA: Diagnosis not present

## 2020-03-27 DIAGNOSIS — D500 Iron deficiency anemia secondary to blood loss (chronic): Secondary | ICD-10-CM | POA: Diagnosis not present

## 2020-03-27 DIAGNOSIS — E1169 Type 2 diabetes mellitus with other specified complication: Secondary | ICD-10-CM | POA: Diagnosis not present

## 2020-03-27 DIAGNOSIS — I480 Paroxysmal atrial fibrillation: Secondary | ICD-10-CM | POA: Diagnosis not present

## 2020-03-27 DIAGNOSIS — E860 Dehydration: Secondary | ICD-10-CM | POA: Diagnosis not present

## 2020-03-27 DIAGNOSIS — Z86718 Personal history of other venous thrombosis and embolism: Secondary | ICD-10-CM | POA: Diagnosis not present

## 2020-03-27 DIAGNOSIS — K922 Gastrointestinal hemorrhage, unspecified: Secondary | ICD-10-CM | POA: Diagnosis not present

## 2020-03-27 DIAGNOSIS — I509 Heart failure, unspecified: Secondary | ICD-10-CM | POA: Diagnosis not present

## 2020-03-27 DIAGNOSIS — Z7984 Long term (current) use of oral hypoglycemic drugs: Secondary | ICD-10-CM | POA: Diagnosis not present

## 2020-03-27 DIAGNOSIS — Z79899 Other long term (current) drug therapy: Secondary | ICD-10-CM | POA: Diagnosis not present

## 2020-03-27 DIAGNOSIS — M109 Gout, unspecified: Secondary | ICD-10-CM | POA: Diagnosis not present

## 2020-03-27 DIAGNOSIS — D62 Acute posthemorrhagic anemia: Secondary | ICD-10-CM | POA: Diagnosis not present

## 2020-03-27 DIAGNOSIS — Z86711 Personal history of pulmonary embolism: Secondary | ICD-10-CM | POA: Diagnosis not present

## 2020-03-27 DIAGNOSIS — I5032 Chronic diastolic (congestive) heart failure: Secondary | ICD-10-CM | POA: Diagnosis not present

## 2020-03-27 DIAGNOSIS — M1990 Unspecified osteoarthritis, unspecified site: Secondary | ICD-10-CM | POA: Diagnosis not present

## 2020-03-27 DIAGNOSIS — Z7901 Long term (current) use of anticoagulants: Secondary | ICD-10-CM | POA: Diagnosis not present

## 2020-03-27 DIAGNOSIS — Z7902 Long term (current) use of antithrombotics/antiplatelets: Secondary | ICD-10-CM | POA: Diagnosis not present

## 2020-03-27 DIAGNOSIS — Z6841 Body Mass Index (BMI) 40.0 and over, adult: Secondary | ICD-10-CM | POA: Diagnosis not present

## 2020-03-27 DIAGNOSIS — E1122 Type 2 diabetes mellitus with diabetic chronic kidney disease: Secondary | ICD-10-CM | POA: Diagnosis not present

## 2020-03-27 DIAGNOSIS — Z8711 Personal history of peptic ulcer disease: Secondary | ICD-10-CM | POA: Diagnosis not present

## 2020-03-27 DIAGNOSIS — I130 Hypertensive heart and chronic kidney disease with heart failure and stage 1 through stage 4 chronic kidney disease, or unspecified chronic kidney disease: Secondary | ICD-10-CM | POA: Diagnosis not present

## 2020-03-27 DIAGNOSIS — D649 Anemia, unspecified: Secondary | ICD-10-CM | POA: Diagnosis not present

## 2020-03-27 DIAGNOSIS — N179 Acute kidney failure, unspecified: Secondary | ICD-10-CM | POA: Diagnosis not present

## 2020-03-27 DIAGNOSIS — R109 Unspecified abdominal pain: Secondary | ICD-10-CM | POA: Diagnosis not present

## 2020-03-28 DIAGNOSIS — N179 Acute kidney failure, unspecified: Secondary | ICD-10-CM | POA: Diagnosis not present

## 2020-03-28 DIAGNOSIS — E1169 Type 2 diabetes mellitus with other specified complication: Secondary | ICD-10-CM | POA: Diagnosis not present

## 2020-03-28 DIAGNOSIS — K922 Gastrointestinal hemorrhage, unspecified: Secondary | ICD-10-CM | POA: Diagnosis not present

## 2020-03-28 DIAGNOSIS — D649 Anemia, unspecified: Secondary | ICD-10-CM | POA: Diagnosis not present

## 2020-03-29 DIAGNOSIS — D649 Anemia, unspecified: Secondary | ICD-10-CM | POA: Diagnosis not present

## 2020-03-29 DIAGNOSIS — N179 Acute kidney failure, unspecified: Secondary | ICD-10-CM | POA: Diagnosis not present

## 2020-03-29 DIAGNOSIS — K922 Gastrointestinal hemorrhage, unspecified: Secondary | ICD-10-CM | POA: Diagnosis not present

## 2020-03-29 DIAGNOSIS — E1169 Type 2 diabetes mellitus with other specified complication: Secondary | ICD-10-CM | POA: Diagnosis not present

## 2020-03-30 DIAGNOSIS — D649 Anemia, unspecified: Secondary | ICD-10-CM | POA: Diagnosis not present

## 2020-03-30 DIAGNOSIS — K922 Gastrointestinal hemorrhage, unspecified: Secondary | ICD-10-CM | POA: Diagnosis not present

## 2020-03-30 DIAGNOSIS — N179 Acute kidney failure, unspecified: Secondary | ICD-10-CM | POA: Diagnosis not present

## 2020-03-30 DIAGNOSIS — E1169 Type 2 diabetes mellitus with other specified complication: Secondary | ICD-10-CM | POA: Diagnosis not present

## 2020-03-31 DIAGNOSIS — K922 Gastrointestinal hemorrhage, unspecified: Secondary | ICD-10-CM | POA: Diagnosis not present

## 2020-03-31 DIAGNOSIS — D649 Anemia, unspecified: Secondary | ICD-10-CM | POA: Diagnosis not present

## 2020-03-31 DIAGNOSIS — N179 Acute kidney failure, unspecified: Secondary | ICD-10-CM | POA: Diagnosis not present

## 2020-03-31 DIAGNOSIS — E1169 Type 2 diabetes mellitus with other specified complication: Secondary | ICD-10-CM | POA: Diagnosis not present

## 2020-04-01 DIAGNOSIS — E1169 Type 2 diabetes mellitus with other specified complication: Secondary | ICD-10-CM | POA: Diagnosis not present

## 2020-04-01 DIAGNOSIS — D649 Anemia, unspecified: Secondary | ICD-10-CM | POA: Diagnosis not present

## 2020-04-01 DIAGNOSIS — N179 Acute kidney failure, unspecified: Secondary | ICD-10-CM | POA: Diagnosis not present

## 2020-04-01 DIAGNOSIS — K922 Gastrointestinal hemorrhage, unspecified: Secondary | ICD-10-CM | POA: Diagnosis not present

## 2020-04-02 DIAGNOSIS — N183 Chronic kidney disease, stage 3 unspecified: Secondary | ICD-10-CM | POA: Diagnosis not present

## 2020-04-02 DIAGNOSIS — E1122 Type 2 diabetes mellitus with diabetic chronic kidney disease: Secondary | ICD-10-CM | POA: Diagnosis not present

## 2020-04-02 DIAGNOSIS — D62 Acute posthemorrhagic anemia: Secondary | ICD-10-CM | POA: Diagnosis not present

## 2020-04-02 DIAGNOSIS — I10 Essential (primary) hypertension: Secondary | ICD-10-CM | POA: Diagnosis not present

## 2020-04-02 DIAGNOSIS — I130 Hypertensive heart and chronic kidney disease with heart failure and stage 1 through stage 4 chronic kidney disease, or unspecified chronic kidney disease: Secondary | ICD-10-CM | POA: Diagnosis not present

## 2020-04-02 DIAGNOSIS — I5032 Chronic diastolic (congestive) heart failure: Secondary | ICD-10-CM | POA: Diagnosis not present

## 2020-04-02 DIAGNOSIS — E1169 Type 2 diabetes mellitus with other specified complication: Secondary | ICD-10-CM | POA: Diagnosis not present

## 2020-04-02 DIAGNOSIS — K922 Gastrointestinal hemorrhage, unspecified: Secondary | ICD-10-CM | POA: Diagnosis not present

## 2020-04-02 DIAGNOSIS — Z7901 Long term (current) use of anticoagulants: Secondary | ICD-10-CM | POA: Diagnosis not present

## 2020-04-02 DIAGNOSIS — D649 Anemia, unspecified: Secondary | ICD-10-CM | POA: Diagnosis not present

## 2020-04-02 DIAGNOSIS — K921 Melena: Secondary | ICD-10-CM | POA: Diagnosis not present

## 2020-04-02 DIAGNOSIS — N179 Acute kidney failure, unspecified: Secondary | ICD-10-CM | POA: Diagnosis not present

## 2020-04-02 DIAGNOSIS — Z9981 Dependence on supplemental oxygen: Secondary | ICD-10-CM | POA: Diagnosis not present

## 2020-04-02 DIAGNOSIS — I480 Paroxysmal atrial fibrillation: Secondary | ICD-10-CM | POA: Diagnosis not present

## 2020-04-02 DIAGNOSIS — J9611 Chronic respiratory failure with hypoxia: Secondary | ICD-10-CM | POA: Diagnosis not present

## 2020-04-02 DIAGNOSIS — R109 Unspecified abdominal pain: Secondary | ICD-10-CM | POA: Diagnosis not present

## 2020-04-06 MED ORDER — PANTOPRAZOLE SODIUM 40 MG IV SOLR
40.00 | INTRAVENOUS | Status: DC
Start: 2020-04-05 — End: 2020-04-06

## 2020-04-06 MED ORDER — GENERIC EXTERNAL MEDICATION
Status: DC
Start: ? — End: 2020-04-06

## 2020-04-06 MED ORDER — CARVEDILOL 12.5 MG PO TABS
12.50 | ORAL_TABLET | ORAL | Status: DC
Start: 2020-04-04 — End: 2020-04-06

## 2020-04-06 MED ORDER — SODIUM CHLORIDE 0.9 % IV SOLN
10.00 | INTRAVENOUS | Status: DC
Start: ? — End: 2020-04-06

## 2020-04-08 DIAGNOSIS — Z9289 Personal history of other medical treatment: Secondary | ICD-10-CM

## 2020-04-08 HISTORY — DX: Personal history of other medical treatment: Z92.89

## 2020-04-11 DIAGNOSIS — I509 Heart failure, unspecified: Secondary | ICD-10-CM | POA: Diagnosis not present

## 2020-04-11 DIAGNOSIS — I4819 Other persistent atrial fibrillation: Secondary | ICD-10-CM | POA: Diagnosis not present

## 2020-04-11 DIAGNOSIS — N183 Chronic kidney disease, stage 3 unspecified: Secondary | ICD-10-CM | POA: Diagnosis not present

## 2020-04-11 DIAGNOSIS — K922 Gastrointestinal hemorrhage, unspecified: Secondary | ICD-10-CM | POA: Diagnosis not present

## 2020-04-11 DIAGNOSIS — D631 Anemia in chronic kidney disease: Secondary | ICD-10-CM | POA: Diagnosis not present

## 2020-04-11 DIAGNOSIS — Z7901 Long term (current) use of anticoagulants: Secondary | ICD-10-CM | POA: Diagnosis not present

## 2020-04-12 DIAGNOSIS — I4891 Unspecified atrial fibrillation: Secondary | ICD-10-CM | POA: Diagnosis not present

## 2020-04-12 DIAGNOSIS — N183 Chronic kidney disease, stage 3 unspecified: Secondary | ICD-10-CM | POA: Diagnosis not present

## 2020-04-12 DIAGNOSIS — J9611 Chronic respiratory failure with hypoxia: Secondary | ICD-10-CM | POA: Diagnosis not present

## 2020-04-12 DIAGNOSIS — Z79899 Other long term (current) drug therapy: Secondary | ICD-10-CM | POA: Diagnosis not present

## 2020-04-12 DIAGNOSIS — Z888 Allergy status to other drugs, medicaments and biological substances status: Secondary | ICD-10-CM | POA: Diagnosis not present

## 2020-04-12 DIAGNOSIS — Z883 Allergy status to other anti-infective agents status: Secondary | ICD-10-CM | POA: Diagnosis not present

## 2020-04-12 DIAGNOSIS — I509 Heart failure, unspecified: Secondary | ICD-10-CM | POA: Diagnosis not present

## 2020-04-12 DIAGNOSIS — I130 Hypertensive heart and chronic kidney disease with heart failure and stage 1 through stage 4 chronic kidney disease, or unspecified chronic kidney disease: Secondary | ICD-10-CM | POA: Diagnosis not present

## 2020-04-12 DIAGNOSIS — K922 Gastrointestinal hemorrhage, unspecified: Secondary | ICD-10-CM | POA: Diagnosis not present

## 2020-04-12 DIAGNOSIS — Z885 Allergy status to narcotic agent status: Secondary | ICD-10-CM | POA: Diagnosis not present

## 2020-04-12 DIAGNOSIS — M170 Bilateral primary osteoarthritis of knee: Secondary | ICD-10-CM | POA: Diagnosis not present

## 2020-04-12 DIAGNOSIS — I872 Venous insufficiency (chronic) (peripheral): Secondary | ICD-10-CM | POA: Diagnosis not present

## 2020-04-12 DIAGNOSIS — Z86711 Personal history of pulmonary embolism: Secondary | ICD-10-CM | POA: Diagnosis not present

## 2020-04-12 DIAGNOSIS — E1122 Type 2 diabetes mellitus with diabetic chronic kidney disease: Secondary | ICD-10-CM | POA: Diagnosis not present

## 2020-04-12 DIAGNOSIS — Z7984 Long term (current) use of oral hypoglycemic drugs: Secondary | ICD-10-CM | POA: Diagnosis not present

## 2020-04-12 DIAGNOSIS — D62 Acute posthemorrhagic anemia: Secondary | ICD-10-CM | POA: Diagnosis not present

## 2020-04-17 DIAGNOSIS — D649 Anemia, unspecified: Secondary | ICD-10-CM | POA: Diagnosis not present

## 2020-04-19 DIAGNOSIS — D649 Anemia, unspecified: Secondary | ICD-10-CM | POA: Diagnosis not present

## 2020-04-20 DIAGNOSIS — D649 Anemia, unspecified: Secondary | ICD-10-CM | POA: Diagnosis not present

## 2020-04-20 DIAGNOSIS — J811 Chronic pulmonary edema: Secondary | ICD-10-CM | POA: Diagnosis not present

## 2020-04-22 DIAGNOSIS — D62 Acute posthemorrhagic anemia: Secondary | ICD-10-CM | POA: Diagnosis not present

## 2020-04-25 DIAGNOSIS — J9610 Chronic respiratory failure, unspecified whether with hypoxia or hypercapnia: Secondary | ICD-10-CM | POA: Diagnosis not present

## 2020-04-25 DIAGNOSIS — I129 Hypertensive chronic kidney disease with stage 1 through stage 4 chronic kidney disease, or unspecified chronic kidney disease: Secondary | ICD-10-CM | POA: Diagnosis not present

## 2020-04-25 DIAGNOSIS — Z888 Allergy status to other drugs, medicaments and biological substances status: Secondary | ICD-10-CM | POA: Diagnosis not present

## 2020-04-25 DIAGNOSIS — Z881 Allergy status to other antibiotic agents status: Secondary | ICD-10-CM | POA: Diagnosis not present

## 2020-04-25 DIAGNOSIS — K922 Gastrointestinal hemorrhage, unspecified: Secondary | ICD-10-CM | POA: Diagnosis not present

## 2020-04-25 DIAGNOSIS — J9611 Chronic respiratory failure with hypoxia: Secondary | ICD-10-CM | POA: Diagnosis not present

## 2020-04-25 DIAGNOSIS — M170 Bilateral primary osteoarthritis of knee: Secondary | ICD-10-CM | POA: Diagnosis not present

## 2020-04-25 DIAGNOSIS — I480 Paroxysmal atrial fibrillation: Secondary | ICD-10-CM | POA: Diagnosis not present

## 2020-04-25 DIAGNOSIS — E1122 Type 2 diabetes mellitus with diabetic chronic kidney disease: Secondary | ICD-10-CM | POA: Diagnosis not present

## 2020-04-25 DIAGNOSIS — Z885 Allergy status to narcotic agent status: Secondary | ICD-10-CM | POA: Diagnosis not present

## 2020-04-25 DIAGNOSIS — D649 Anemia, unspecified: Secondary | ICD-10-CM | POA: Diagnosis not present

## 2020-04-25 DIAGNOSIS — I130 Hypertensive heart and chronic kidney disease with heart failure and stage 1 through stage 4 chronic kidney disease, or unspecified chronic kidney disease: Secondary | ICD-10-CM | POA: Diagnosis not present

## 2020-04-25 DIAGNOSIS — N183 Chronic kidney disease, stage 3 unspecified: Secondary | ICD-10-CM | POA: Diagnosis not present

## 2020-04-25 DIAGNOSIS — Z853 Personal history of malignant neoplasm of breast: Secondary | ICD-10-CM | POA: Diagnosis not present

## 2020-04-25 DIAGNOSIS — Z9981 Dependence on supplemental oxygen: Secondary | ICD-10-CM | POA: Diagnosis not present

## 2020-04-25 DIAGNOSIS — Z86711 Personal history of pulmonary embolism: Secondary | ICD-10-CM | POA: Diagnosis not present

## 2020-04-25 DIAGNOSIS — Z7901 Long term (current) use of anticoagulants: Secondary | ICD-10-CM | POA: Diagnosis not present

## 2020-04-25 DIAGNOSIS — D62 Acute posthemorrhagic anemia: Secondary | ICD-10-CM | POA: Diagnosis not present

## 2020-04-25 DIAGNOSIS — Z6841 Body Mass Index (BMI) 40.0 and over, adult: Secondary | ICD-10-CM | POA: Diagnosis not present

## 2020-05-03 DIAGNOSIS — K862 Cyst of pancreas: Secondary | ICD-10-CM | POA: Diagnosis not present

## 2020-05-03 DIAGNOSIS — J9611 Chronic respiratory failure with hypoxia: Secondary | ICD-10-CM | POA: Diagnosis not present

## 2020-05-03 DIAGNOSIS — R5383 Other fatigue: Secondary | ICD-10-CM | POA: Diagnosis not present

## 2020-05-03 DIAGNOSIS — M5136 Other intervertebral disc degeneration, lumbar region: Secondary | ICD-10-CM | POA: Diagnosis not present

## 2020-05-03 DIAGNOSIS — I5032 Chronic diastolic (congestive) heart failure: Secondary | ICD-10-CM | POA: Diagnosis not present

## 2020-05-03 DIAGNOSIS — R5381 Other malaise: Secondary | ICD-10-CM | POA: Diagnosis not present

## 2020-05-03 DIAGNOSIS — N183 Chronic kidney disease, stage 3 unspecified: Secondary | ICD-10-CM | POA: Diagnosis not present

## 2020-05-03 DIAGNOSIS — I4819 Other persistent atrial fibrillation: Secondary | ICD-10-CM | POA: Diagnosis not present

## 2020-05-03 DIAGNOSIS — R300 Dysuria: Secondary | ICD-10-CM | POA: Diagnosis not present

## 2020-05-03 DIAGNOSIS — K922 Gastrointestinal hemorrhage, unspecified: Secondary | ICD-10-CM | POA: Diagnosis not present

## 2020-05-03 DIAGNOSIS — N390 Urinary tract infection, site not specified: Secondary | ICD-10-CM | POA: Diagnosis not present

## 2020-05-03 DIAGNOSIS — D500 Iron deficiency anemia secondary to blood loss (chronic): Secondary | ICD-10-CM | POA: Diagnosis not present

## 2020-05-03 DIAGNOSIS — E1122 Type 2 diabetes mellitus with diabetic chronic kidney disease: Secondary | ICD-10-CM | POA: Diagnosis not present

## 2020-05-03 DIAGNOSIS — R319 Hematuria, unspecified: Secondary | ICD-10-CM | POA: Diagnosis not present

## 2020-05-08 DIAGNOSIS — I509 Heart failure, unspecified: Secondary | ICD-10-CM | POA: Diagnosis not present

## 2020-05-08 DIAGNOSIS — R0602 Shortness of breath: Secondary | ICD-10-CM | POA: Diagnosis not present

## 2020-05-08 DIAGNOSIS — I110 Hypertensive heart disease with heart failure: Secondary | ICD-10-CM | POA: Diagnosis not present

## 2020-05-09 DIAGNOSIS — N183 Chronic kidney disease, stage 3 unspecified: Secondary | ICD-10-CM | POA: Diagnosis not present

## 2020-05-09 DIAGNOSIS — E1169 Type 2 diabetes mellitus with other specified complication: Secondary | ICD-10-CM | POA: Diagnosis not present

## 2020-05-09 DIAGNOSIS — Z9981 Dependence on supplemental oxygen: Secondary | ICD-10-CM | POA: Diagnosis not present

## 2020-05-09 DIAGNOSIS — I4819 Other persistent atrial fibrillation: Secondary | ICD-10-CM | POA: Diagnosis not present

## 2020-05-09 DIAGNOSIS — E1122 Type 2 diabetes mellitus with diabetic chronic kidney disease: Secondary | ICD-10-CM | POA: Diagnosis not present

## 2020-05-09 DIAGNOSIS — D649 Anemia, unspecified: Secondary | ICD-10-CM | POA: Diagnosis not present

## 2020-05-09 DIAGNOSIS — I361 Nonrheumatic tricuspid (valve) insufficiency: Secondary | ICD-10-CM | POA: Diagnosis not present

## 2020-05-09 DIAGNOSIS — L304 Erythema intertrigo: Secondary | ICD-10-CM | POA: Diagnosis not present

## 2020-05-09 DIAGNOSIS — M1990 Unspecified osteoarthritis, unspecified site: Secondary | ICD-10-CM | POA: Diagnosis not present

## 2020-05-09 DIAGNOSIS — D500 Iron deficiency anemia secondary to blood loss (chronic): Secondary | ICD-10-CM | POA: Diagnosis not present

## 2020-05-09 DIAGNOSIS — E119 Type 2 diabetes mellitus without complications: Secondary | ICD-10-CM | POA: Diagnosis not present

## 2020-05-09 DIAGNOSIS — I10 Essential (primary) hypertension: Secondary | ICD-10-CM | POA: Diagnosis not present

## 2020-05-09 DIAGNOSIS — Z86718 Personal history of other venous thrombosis and embolism: Secondary | ICD-10-CM | POA: Diagnosis not present

## 2020-05-09 DIAGNOSIS — N1831 Chronic kidney disease, stage 3a: Secondary | ICD-10-CM | POA: Diagnosis not present

## 2020-05-09 DIAGNOSIS — I5033 Acute on chronic diastolic (congestive) heart failure: Secondary | ICD-10-CM | POA: Diagnosis not present

## 2020-05-09 DIAGNOSIS — B368 Other specified superficial mycoses: Secondary | ICD-10-CM | POA: Diagnosis not present

## 2020-05-09 DIAGNOSIS — Z86711 Personal history of pulmonary embolism: Secondary | ICD-10-CM | POA: Diagnosis not present

## 2020-05-09 DIAGNOSIS — I480 Paroxysmal atrial fibrillation: Secondary | ICD-10-CM | POA: Diagnosis not present

## 2020-05-09 DIAGNOSIS — Z7984 Long term (current) use of oral hypoglycemic drugs: Secondary | ICD-10-CM | POA: Diagnosis not present

## 2020-05-09 DIAGNOSIS — I472 Ventricular tachycardia: Secondary | ICD-10-CM | POA: Diagnosis not present

## 2020-05-09 DIAGNOSIS — Z7901 Long term (current) use of anticoagulants: Secondary | ICD-10-CM | POA: Diagnosis not present

## 2020-05-09 DIAGNOSIS — I509 Heart failure, unspecified: Secondary | ICD-10-CM | POA: Diagnosis not present

## 2020-05-09 DIAGNOSIS — Z8711 Personal history of peptic ulcer disease: Secondary | ICD-10-CM | POA: Diagnosis not present

## 2020-05-09 DIAGNOSIS — I272 Pulmonary hypertension, unspecified: Secondary | ICD-10-CM | POA: Diagnosis not present

## 2020-05-09 DIAGNOSIS — R001 Bradycardia, unspecified: Secondary | ICD-10-CM | POA: Diagnosis not present

## 2020-05-09 DIAGNOSIS — E662 Morbid (severe) obesity with alveolar hypoventilation: Secondary | ICD-10-CM | POA: Diagnosis not present

## 2020-05-09 DIAGNOSIS — Z6841 Body Mass Index (BMI) 40.0 and over, adult: Secondary | ICD-10-CM | POA: Diagnosis not present

## 2020-05-09 DIAGNOSIS — R0602 Shortness of breath: Secondary | ICD-10-CM | POA: Diagnosis not present

## 2020-05-09 DIAGNOSIS — E785 Hyperlipidemia, unspecified: Secondary | ICD-10-CM | POA: Diagnosis not present

## 2020-05-09 DIAGNOSIS — I4891 Unspecified atrial fibrillation: Secondary | ICD-10-CM | POA: Diagnosis not present

## 2020-05-09 DIAGNOSIS — I110 Hypertensive heart disease with heart failure: Secondary | ICD-10-CM | POA: Diagnosis not present

## 2020-05-09 DIAGNOSIS — I340 Nonrheumatic mitral (valve) insufficiency: Secondary | ICD-10-CM | POA: Diagnosis not present

## 2020-05-09 DIAGNOSIS — I130 Hypertensive heart and chronic kidney disease with heart failure and stage 1 through stage 4 chronic kidney disease, or unspecified chronic kidney disease: Secondary | ICD-10-CM | POA: Diagnosis not present

## 2020-05-09 DIAGNOSIS — R079 Chest pain, unspecified: Secondary | ICD-10-CM | POA: Diagnosis not present

## 2020-05-10 DIAGNOSIS — I5033 Acute on chronic diastolic (congestive) heart failure: Secondary | ICD-10-CM

## 2020-05-10 DIAGNOSIS — D649 Anemia, unspecified: Secondary | ICD-10-CM

## 2020-05-10 DIAGNOSIS — I4819 Other persistent atrial fibrillation: Secondary | ICD-10-CM

## 2020-05-10 DIAGNOSIS — I272 Pulmonary hypertension, unspecified: Secondary | ICD-10-CM

## 2020-05-10 DIAGNOSIS — E1169 Type 2 diabetes mellitus with other specified complication: Secondary | ICD-10-CM

## 2020-05-10 DIAGNOSIS — I10 Essential (primary) hypertension: Secondary | ICD-10-CM

## 2020-05-10 DIAGNOSIS — N183 Chronic kidney disease, stage 3 unspecified: Secondary | ICD-10-CM

## 2020-05-13 DIAGNOSIS — D500 Iron deficiency anemia secondary to blood loss (chronic): Secondary | ICD-10-CM

## 2020-05-15 DIAGNOSIS — I4819 Other persistent atrial fibrillation: Secondary | ICD-10-CM

## 2020-05-15 DIAGNOSIS — I472 Ventricular tachycardia: Secondary | ICD-10-CM

## 2020-05-15 DIAGNOSIS — E119 Type 2 diabetes mellitus without complications: Secondary | ICD-10-CM

## 2020-05-15 DIAGNOSIS — D649 Anemia, unspecified: Secondary | ICD-10-CM

## 2020-05-15 DIAGNOSIS — N183 Chronic kidney disease, stage 3 unspecified: Secondary | ICD-10-CM

## 2020-05-16 DIAGNOSIS — R079 Chest pain, unspecified: Secondary | ICD-10-CM

## 2020-05-18 DIAGNOSIS — I4819 Other persistent atrial fibrillation: Secondary | ICD-10-CM | POA: Diagnosis not present

## 2020-05-18 DIAGNOSIS — Z8719 Personal history of other diseases of the digestive system: Secondary | ICD-10-CM | POA: Diagnosis not present

## 2020-05-18 DIAGNOSIS — E782 Mixed hyperlipidemia: Secondary | ICD-10-CM | POA: Diagnosis not present

## 2020-05-18 DIAGNOSIS — R0602 Shortness of breath: Secondary | ICD-10-CM | POA: Diagnosis not present

## 2020-05-18 DIAGNOSIS — I4891 Unspecified atrial fibrillation: Secondary | ICD-10-CM | POA: Diagnosis not present

## 2020-05-18 DIAGNOSIS — U071 COVID-19: Secondary | ICD-10-CM | POA: Diagnosis not present

## 2020-05-18 DIAGNOSIS — I872 Venous insufficiency (chronic) (peripheral): Secondary | ICD-10-CM | POA: Diagnosis not present

## 2020-05-18 DIAGNOSIS — Z6841 Body Mass Index (BMI) 40.0 and over, adult: Secondary | ICD-10-CM | POA: Diagnosis not present

## 2020-05-18 DIAGNOSIS — I10 Essential (primary) hypertension: Secondary | ICD-10-CM | POA: Diagnosis not present

## 2020-05-18 DIAGNOSIS — I272 Pulmonary hypertension, unspecified: Secondary | ICD-10-CM | POA: Diagnosis not present

## 2020-05-18 DIAGNOSIS — Z7901 Long term (current) use of anticoagulants: Secondary | ICD-10-CM | POA: Diagnosis not present

## 2020-05-29 DIAGNOSIS — R1032 Left lower quadrant pain: Secondary | ICD-10-CM | POA: Diagnosis not present

## 2020-06-06 DIAGNOSIS — D500 Iron deficiency anemia secondary to blood loss (chronic): Secondary | ICD-10-CM | POA: Diagnosis not present

## 2020-06-08 DIAGNOSIS — N1832 Chronic kidney disease, stage 3b: Secondary | ICD-10-CM | POA: Diagnosis not present

## 2020-06-08 DIAGNOSIS — Z6841 Body Mass Index (BMI) 40.0 and over, adult: Secondary | ICD-10-CM | POA: Diagnosis not present

## 2020-06-08 DIAGNOSIS — N183 Chronic kidney disease, stage 3 unspecified: Secondary | ICD-10-CM | POA: Diagnosis not present

## 2020-06-08 DIAGNOSIS — I248 Other forms of acute ischemic heart disease: Secondary | ICD-10-CM | POA: Diagnosis not present

## 2020-06-08 DIAGNOSIS — R195 Other fecal abnormalities: Secondary | ICD-10-CM | POA: Diagnosis not present

## 2020-06-08 DIAGNOSIS — Z23 Encounter for immunization: Secondary | ICD-10-CM | POA: Diagnosis not present

## 2020-06-08 DIAGNOSIS — I10 Essential (primary) hypertension: Secondary | ICD-10-CM | POA: Diagnosis not present

## 2020-06-08 DIAGNOSIS — E1122 Type 2 diabetes mellitus with diabetic chronic kidney disease: Secondary | ICD-10-CM | POA: Diagnosis not present

## 2020-06-08 DIAGNOSIS — K5521 Angiodysplasia of colon with hemorrhage: Secondary | ICD-10-CM | POA: Diagnosis not present

## 2020-06-08 DIAGNOSIS — R1032 Left lower quadrant pain: Secondary | ICD-10-CM | POA: Diagnosis not present

## 2020-06-08 DIAGNOSIS — J9611 Chronic respiratory failure with hypoxia: Secondary | ICD-10-CM | POA: Diagnosis not present

## 2020-06-08 DIAGNOSIS — D500 Iron deficiency anemia secondary to blood loss (chronic): Secondary | ICD-10-CM | POA: Diagnosis not present

## 2020-06-08 DIAGNOSIS — I4819 Other persistent atrial fibrillation: Secondary | ICD-10-CM | POA: Diagnosis not present

## 2020-06-08 DIAGNOSIS — Z0000 Encounter for general adult medical examination without abnormal findings: Secondary | ICD-10-CM | POA: Diagnosis not present

## 2020-06-08 DIAGNOSIS — I872 Venous insufficiency (chronic) (peripheral): Secondary | ICD-10-CM | POA: Diagnosis not present

## 2020-06-08 DIAGNOSIS — I130 Hypertensive heart and chronic kidney disease with heart failure and stage 1 through stage 4 chronic kidney disease, or unspecified chronic kidney disease: Secondary | ICD-10-CM | POA: Diagnosis not present

## 2020-06-08 DIAGNOSIS — I5032 Chronic diastolic (congestive) heart failure: Secondary | ICD-10-CM | POA: Diagnosis not present

## 2020-06-11 DIAGNOSIS — I509 Heart failure, unspecified: Secondary | ICD-10-CM | POA: Diagnosis not present

## 2020-06-15 ENCOUNTER — Other Ambulatory Visit: Payer: Self-pay

## 2020-06-15 ENCOUNTER — Emergency Department (HOSPITAL_COMMUNITY): Payer: PPO

## 2020-06-15 ENCOUNTER — Inpatient Hospital Stay (HOSPITAL_COMMUNITY)
Admission: EM | Admit: 2020-06-15 | Discharge: 2020-06-20 | DRG: 291 | Disposition: A | Discharge status: Home Health | Payer: PPO | Attending: Internal Medicine | Admitting: Internal Medicine

## 2020-06-15 ENCOUNTER — Encounter (HOSPITAL_COMMUNITY): Payer: Self-pay | Admitting: Emergency Medicine

## 2020-06-15 DIAGNOSIS — Z7984 Long term (current) use of oral hypoglycemic drugs: Secondary | ICD-10-CM | POA: Diagnosis not present

## 2020-06-15 DIAGNOSIS — Z79899 Other long term (current) drug therapy: Secondary | ICD-10-CM | POA: Diagnosis not present

## 2020-06-15 DIAGNOSIS — I472 Ventricular tachycardia: Secondary | ICD-10-CM | POA: Diagnosis not present

## 2020-06-15 DIAGNOSIS — R17 Unspecified jaundice: Secondary | ICD-10-CM | POA: Diagnosis not present

## 2020-06-15 DIAGNOSIS — J9811 Atelectasis: Secondary | ICD-10-CM | POA: Diagnosis not present

## 2020-06-15 DIAGNOSIS — J9621 Acute and chronic respiratory failure with hypoxia: Secondary | ICD-10-CM | POA: Diagnosis present

## 2020-06-15 DIAGNOSIS — Z888 Allergy status to other drugs, medicaments and biological substances status: Secondary | ICD-10-CM

## 2020-06-15 DIAGNOSIS — Z86711 Personal history of pulmonary embolism: Secondary | ICD-10-CM | POA: Diagnosis not present

## 2020-06-15 DIAGNOSIS — I509 Heart failure, unspecified: Secondary | ICD-10-CM | POA: Diagnosis not present

## 2020-06-15 DIAGNOSIS — Z20822 Contact with and (suspected) exposure to covid-19: Secondary | ICD-10-CM | POA: Diagnosis not present

## 2020-06-15 DIAGNOSIS — I5033 Acute on chronic diastolic (congestive) heart failure: Secondary | ICD-10-CM | POA: Diagnosis not present

## 2020-06-15 DIAGNOSIS — J449 Chronic obstructive pulmonary disease, unspecified: Secondary | ICD-10-CM | POA: Diagnosis not present

## 2020-06-15 DIAGNOSIS — E1122 Type 2 diabetes mellitus with diabetic chronic kidney disease: Secondary | ICD-10-CM | POA: Diagnosis present

## 2020-06-15 DIAGNOSIS — I4819 Other persistent atrial fibrillation: Secondary | ICD-10-CM | POA: Diagnosis not present

## 2020-06-15 DIAGNOSIS — I272 Pulmonary hypertension, unspecified: Secondary | ICD-10-CM | POA: Diagnosis present

## 2020-06-15 DIAGNOSIS — Z6841 Body Mass Index (BMI) 40.0 and over, adult: Secondary | ICD-10-CM

## 2020-06-15 DIAGNOSIS — R109 Unspecified abdominal pain: Secondary | ICD-10-CM | POA: Diagnosis not present

## 2020-06-15 DIAGNOSIS — J9601 Acute respiratory failure with hypoxia: Secondary | ICD-10-CM | POA: Diagnosis not present

## 2020-06-15 DIAGNOSIS — E1159 Type 2 diabetes mellitus with other circulatory complications: Secondary | ICD-10-CM | POA: Diagnosis present

## 2020-06-15 DIAGNOSIS — N183 Chronic kidney disease, stage 3 unspecified: Secondary | ICD-10-CM | POA: Diagnosis present

## 2020-06-15 DIAGNOSIS — N189 Chronic kidney disease, unspecified: Secondary | ICD-10-CM | POA: Diagnosis present

## 2020-06-15 DIAGNOSIS — I152 Hypertension secondary to endocrine disorders: Secondary | ICD-10-CM | POA: Diagnosis present

## 2020-06-15 DIAGNOSIS — J90 Pleural effusion, not elsewhere classified: Secondary | ICD-10-CM | POA: Diagnosis not present

## 2020-06-15 DIAGNOSIS — I130 Hypertensive heart and chronic kidney disease with heart failure and stage 1 through stage 4 chronic kidney disease, or unspecified chronic kidney disease: Secondary | ICD-10-CM | POA: Diagnosis not present

## 2020-06-15 DIAGNOSIS — R0602 Shortness of breath: Secondary | ICD-10-CM | POA: Diagnosis not present

## 2020-06-15 DIAGNOSIS — Z9981 Dependence on supplemental oxygen: Secondary | ICD-10-CM

## 2020-06-15 DIAGNOSIS — Z885 Allergy status to narcotic agent status: Secondary | ICD-10-CM

## 2020-06-15 DIAGNOSIS — R221 Localized swelling, mass and lump, neck: Secondary | ICD-10-CM | POA: Diagnosis present

## 2020-06-15 DIAGNOSIS — Z8616 Personal history of COVID-19: Secondary | ICD-10-CM

## 2020-06-15 DIAGNOSIS — I517 Cardiomegaly: Secondary | ICD-10-CM | POA: Diagnosis not present

## 2020-06-15 DIAGNOSIS — I110 Hypertensive heart disease with heart failure: Secondary | ICD-10-CM | POA: Diagnosis not present

## 2020-06-15 DIAGNOSIS — G8929 Other chronic pain: Secondary | ICD-10-CM | POA: Diagnosis present

## 2020-06-15 DIAGNOSIS — E119 Type 2 diabetes mellitus without complications: Secondary | ICD-10-CM

## 2020-06-15 MED ORDER — ALBUTEROL SULFATE HFA 108 (90 BASE) MCG/ACT IN AERS
2.0000 | INHALATION_SPRAY | RESPIRATORY_TRACT | Status: DC | PRN
  Filled 2020-06-15: qty 6.7

## 2020-06-15 MED ORDER — ALBUTEROL SULFATE (2.5 MG/3ML) 0.083% IN NEBU: 5.0000 mg | INHALATION_SOLUTION | Freq: Once | RESPIRATORY_TRACT | Status: DC

## 2020-06-15 MED ORDER — ALBUTEROL SULFATE (2.5 MG/3ML) 0.083% IN NEBU: 3.0000 mL | INHALATION_SOLUTION | RESPIRATORY_TRACT | Status: DC | PRN

## 2020-06-15 MED ORDER — ALBUTEROL SULFATE HFA 108 (90 BASE) MCG/ACT IN AERS: 2.0000 | INHALATION_SPRAY | RESPIRATORY_TRACT | Status: AC

## 2020-06-15 MED ORDER — ALBUTEROL SULFATE (2.5 MG/3ML) 0.083% IN NEBU: 3.0000 mL | INHALATION_SOLUTION | RESPIRATORY_TRACT | Status: DC

## 2020-06-15 NOTE — ED Triage Notes (Signed)
Pt c/o increase abd pain and sob for the last 4 days.

## 2020-06-15 NOTE — ED Notes (Signed)
Pt is home O2 dependent on 2 L, she is 88% on the 2L [place], increase to 4L and SPO2 up to 91%

## 2020-06-15 NOTE — Progress Notes (Signed)
Pt ordered HHN (5mg  Albuterol) however, pt is Covid+, will need to be changed to MDI.

## 2020-06-16 DIAGNOSIS — J9621 Acute and chronic respiratory failure with hypoxia: Secondary | ICD-10-CM | POA: Diagnosis present

## 2020-06-16 DIAGNOSIS — J9601 Acute respiratory failure with hypoxia: Secondary | ICD-10-CM | POA: Diagnosis present

## 2020-06-16 LAB — CBC WITH DIFFERENTIAL/PLATELET
Abs Immature Granulocytes: 0.01 10*3/uL (ref 0.00–0.07)
Basophils Absolute: 0 10*3/uL (ref 0.0–0.1)
Basophils Relative: 0 %
Eosinophils Absolute: 0.1 10*3/uL (ref 0.0–0.5)
Eosinophils Relative: 2 %
HCT: 31.5 % — ABNORMAL LOW (ref 36.0–46.0)
Hemoglobin: 9.8 g/dL — ABNORMAL LOW (ref 12.0–15.0)
Immature Granulocytes: 0 %
Lymphocytes Relative: 20 %
Lymphs Abs: 1.1 10*3/uL (ref 0.7–4.0)
MCH: 27 pg (ref 26.0–34.0)
MCHC: 31.1 g/dL (ref 30.0–36.0)
MCV: 86.8 fL (ref 80.0–100.0)
Monocytes Absolute: 0.4 10*3/uL (ref 0.1–1.0)
Monocytes Relative: 8 %
Neutro Abs: 3.8 10*3/uL (ref 1.7–7.7)
Neutrophils Relative %: 70 %
Platelets: 111 10*3/uL — ABNORMAL LOW (ref 150–400)
RBC: 3.63 MIL/uL — ABNORMAL LOW (ref 3.87–5.11)
RDW: 16.2 % — ABNORMAL HIGH (ref 11.5–15.5)
WBC: 5.4 10*3/uL (ref 4.0–10.5)
nRBC: 0 % (ref 0.0–0.2)

## 2020-06-16 LAB — COMPREHENSIVE METABOLIC PANEL
ALT: 15 U/L (ref 0–44)
AST: 23 U/L (ref 15–41)
Albumin: 4 g/dL (ref 3.5–5.0)
Alkaline Phosphatase: 85 U/L (ref 38–126)
Anion gap: 10 (ref 5–15)
BUN: 19 mg/dL (ref 8–23)
CO2: 29 mmol/L (ref 22–32)
Calcium: 9.3 mg/dL (ref 8.9–10.3)
Chloride: 102 mmol/L (ref 98–111)
Creatinine, Ser: 1.39 mg/dL — ABNORMAL HIGH (ref 0.44–1.00)
GFR calc Af Amer: 46 mL/min — ABNORMAL LOW (ref >60)
GFR calc non Af Amer: 39 mL/min — ABNORMAL LOW (ref >60)
Glucose, Bld: 147 mg/dL — ABNORMAL HIGH (ref 70–99)
Potassium: 3.7 mmol/L (ref 3.5–5.1)
Sodium: 141 mmol/L (ref 135–145)
Total Bilirubin: 2.3 mg/dL — ABNORMAL HIGH (ref 0.3–1.2)
Total Protein: 8.2 g/dL — ABNORMAL HIGH (ref 6.5–8.1)

## 2020-06-16 LAB — PROTIME-INR
INR: 1.3 — ABNORMAL HIGH (ref 0.8–1.2)
Prothrombin Time: 15.8 seconds — ABNORMAL HIGH (ref 11.4–15.2)

## 2020-06-16 LAB — GLUCOSE, CAPILLARY
Glucose-Capillary: 85 mg/dL (ref 70–99)
Glucose-Capillary: 160 mg/dL — ABNORMAL HIGH (ref 70–99)

## 2020-06-16 LAB — TROPONIN I (HIGH SENSITIVITY)
Troponin I (High Sensitivity): 314 ng/L (ref <18)
Troponin I (High Sensitivity): 285 ng/L (ref <18)

## 2020-06-16 LAB — BRAIN NATRIURETIC PEPTIDE: B Natriuretic Peptide: 675.6 pg/mL — ABNORMAL HIGH (ref 0.0–100.0)

## 2020-06-16 LAB — MAGNESIUM: Magnesium: 1.9 mg/dL (ref 1.7–2.4)

## 2020-06-16 LAB — LIPASE, BLOOD: Lipase: 18 U/L (ref 11–51)

## 2020-06-16 MED ORDER — FUROSEMIDE 20 MG PO TABS
80.0000 mg | ORAL_TABLET | Freq: Once | ORAL | Status: AC
  Administered 2020-06-16: 80 mg via ORAL
  Filled 2020-06-16: qty 4

## 2020-06-16 MED ORDER — LISINOPRIL 20 MG PO TABS
20.0000 mg | ORAL_TABLET | Freq: Every day | ORAL | Status: DC
  Administered 2020-06-17: 20 mg via ORAL
  Filled 2020-06-16: qty 1

## 2020-06-16 MED ORDER — PANTOPRAZOLE SODIUM 40 MG PO TBEC
40.0000 mg | DELAYED_RELEASE_TABLET | Freq: Every day | ORAL | Status: DC
  Administered 2020-06-16 – 2020-06-20 (×5): 40 mg via ORAL
  Filled 2020-06-16 (×6): qty 1

## 2020-06-16 MED ORDER — FUROSEMIDE 10 MG/ML IJ SOLN
80.0000 mg | Freq: Once | INTRAMUSCULAR | Status: AC
  Administered 2020-06-16: 80 mg via INTRAVENOUS
  Filled 2020-06-16: qty 8

## 2020-06-16 MED ORDER — ENOXAPARIN SODIUM 40 MG/0.4ML ~~LOC~~ SOLN
40.0000 mg | SUBCUTANEOUS | Status: DC
  Administered 2020-06-16 – 2020-06-19 (×4): 40 mg via SUBCUTANEOUS
  Filled 2020-06-16 (×4): qty 0.4

## 2020-06-16 MED ORDER — CARVEDILOL 12.5 MG PO TABS
12.5000 mg | ORAL_TABLET | Freq: Two times a day (BID) | ORAL | Status: DC
  Administered 2020-06-16 – 2020-06-20 (×7): 12.5 mg via ORAL
  Filled 2020-06-16 (×8): qty 1

## 2020-06-16 MED ORDER — MAGNESIUM SULFATE 2 GM/50ML IV SOLN
2.0000 g | Freq: Once | INTRAVENOUS | Status: AC
  Administered 2020-06-16: 2 g via INTRAVENOUS
  Filled 2020-06-16: qty 50

## 2020-06-16 MED ORDER — INSULIN ASPART 100 UNIT/ML ~~LOC~~ SOLN
0.0000 [IU] | Freq: Three times a day (TID) | SUBCUTANEOUS | Status: DC
  Administered 2020-06-17: 2 [IU] via SUBCUTANEOUS
  Administered 2020-06-17: 1 [IU] via SUBCUTANEOUS
  Administered 2020-06-17: 2 [IU] via SUBCUTANEOUS
  Administered 2020-06-18: 1 [IU] via SUBCUTANEOUS
  Administered 2020-06-18 (×2): 2 [IU] via SUBCUTANEOUS
  Administered 2020-06-19: 1 [IU] via SUBCUTANEOUS
  Administered 2020-06-19: 2 [IU] via SUBCUTANEOUS
  Administered 2020-06-20: 1 [IU] via SUBCUTANEOUS
  Administered 2020-06-20: 2 [IU] via SUBCUTANEOUS

## 2020-06-16 NOTE — Plan of Care (Signed)

## 2020-06-16 NOTE — ED Notes (Signed)
Attempted report 

## 2020-06-16 NOTE — H&P (Signed)
Date: 06/16/2020               Patient Name:  Rita [name] MRN: [id number]  DOB: 1956-05-15 Age / Sex: 64 y.o., female   PCP: Rita Mina., MD         Medical Service: Internal Medicine Teaching Service         Attending Physician: Dr. Velna Ochs, MD    First Contact: Dr. Allyson Sabal Pager: [phone]  Second Contact: Dr. 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 She states this has been progressively getting worse, and she has been using her oxygen all of the time whereas she usually uses it just as needed and at night. She denies cough, chest pain, palpitations. She does not feel that her legs are swollen but her abdomen does feel more swollen. She takes 1-2 pills of lasix per day depending on her swelling. She has increased her dose to two pills, but this has not helped her urinate more. She is hoping she can go home tomorrow.  She denies nausea, dysuria, or difficulty with urination. She has chronic abdominal pain that has been worked up extensively but she states they are not sure what is causing it. She mentions that she may be referred to [facility] for further work-up.   Social:   She lives at home with her husband She has never smoked  She used to work in a [employer] but is retired now.   Family History:   Family History  Problem Relation Age of Onset  . Cancer Mother      Meds:  Current Meds  Medication Sig  . carvedilol (COREG) 25 MG tablet Take 12.5 mg by mouth 2 (two) times daily with a meal.   . ferrous sulfate 325  (65 FE) MG tablet Take 325 mg by mouth daily with breakfast.  . furosemide (LASIX) 40 MG tablet Take 40 mg by mouth daily.   Marland Kitchen glipiZIDE (GLUCOTROL XL) 10 MG 24 hr tablet Take 10 mg by mouth daily with breakfast.  . lisinopril (ZESTRIL) 20 MG tablet Take 20 mg by mouth daily.  . pantoprazole (PROTONIX) 40 MG tablet Take 40 mg by mouth daily.  . potassium chloride SA (KLOR-CON) 20 MEQ tablet Take 20 mEq by mouth daily.     Allergies: Allergies as of 06/15/2020 - Review Complete 06/15/2020  Allergen Reaction Noted  . Codeine  12/30/2019  . Prednisone  12/30/2019  . Moxifloxacin Rash 12/30/2019   Past Medical History:  Diagnosis Date  . Atrial fibrillation ([place])   . Chronic respiratory failure with hypoxia, on home O2 therapy ([place])   . Diabetes mellitus without complication ([place])   . Diastolic CHF ([place])   . Hypertension      Review of Systems: A complete ROS was negative except as per HPI.   Physical Exam: Blood pressure (!) 145/71, pulse 70, temperature 98.2 F (36.8 C), resp. rate 14, height 5\' 1"  (1.549 m), weight 127.5 kg, SpO2 100 %.  Constitution: NAD, sitting up in bed Cardio: irregular rate & rhythm, no  m/r/g, no LE edema, no JVP Respiratory: decreased breath sounds lower lobes bilaterally, [place] in place Abdominal: TTP LLQ, normal bowel sounds, soft, non-distended MSK: moving all extremities  Neuro: a&o, normal affect  Skin: c/d/i   EKG: personally reviewed my interpretation is atrial fibrillation  CXR: personally reviewed my interpretation is bilateral effusions with edema and chronic atelectasis   Assessment & Plan by Problem: Active Problems:   Acute respiratory failure with hypoxia (HCC)  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   HFpEF Exacerbation Pulmonary Hypertension BNP 650, last 526 one month ago when she was admitted at [facility] for similar diagnosis. Unable to see echo but per cardiology follow-up Echo was "good" and pulmonary hypertension was stable. Nuclear stress test done at that time was also normal. Troponins flat 314 > 285. Give 80 mg po lasix in the ER with little output. She does not have overt signs of volume overload except that it appears she may carry much of her fluid in the abdomen. Her weight after discharge from [facility] was 107 kg. Today is 127 kg. While it's unlikely that she has gained 50 kg in that span of time, this indicates her weight is likely increased, which is corroborated by x-ray findings.   - IV lasix 80 mg  - strict I/O, daily weights  - continue home coreg 12.5 mg bid - Mg goal >2, Mg 2 mg ordered - PT/OT - restart home lisinopril in the am - while it appears like creatinine is increased, per care everywhere she is at her baseline.   Persistent Atrial Fibrillation Stable, not on anticoagulation due to history of GI bleeds. Continue coreg.   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 Bilirubin elevated 06/08/20 as well but not prior to this. She has had extensive work-up for her chronic abdominal pain and did have positive Direct coombs on 04/22/20 and 04/12/20 in addition to positive cold agglutinins on 5/05, which was normal on 5/15. She also has had capsule endoscopy, unable to see records. CT enterography 04/04/20 with only small cystic pancreatic lesion. Colonoscopy 3/21 normal. Her abdominal pain is currently stable and only occurs with palpation. Possible that a component of this is from excess fluid and gut edema.   - hemoglobin stable, follow-up with GI for planned small bowel enteroscopy  - indirect bili ordered - per GI note 04/25/20 hemolysis work-up negative.  - cont. protonix 40 mg qd  Type II DM Last  a1c 6.1 in May.   - SSI  Diet:  VTE: lovenox IVF: none Code: full  Dispo: Admit patient to Inpatient with expected length of stay greater than 2 midnights.  SignedMarty Heck, DO 06/16/2020, 5:09 PM  Pager: [phone]

## 2020-06-16 NOTE — ED Notes (Signed)
Informed provider of critical trop

## 2020-06-16 NOTE — ED Provider Notes (Signed)
[facility] [facility] EMERGENCY DEPARTMENT Provider Note   CSN: [id number] Arrival date & time: 06/15/20  1821     History Chief Complaint  Patient presents with  . Abdominal Pain  . Shortness of Breath    Rita [name] is a 64 y.o. female.   Abdominal Pain Pain location:  Generalized Associated symptoms: shortness of breath   Shortness of Breath Severity:  Moderate Onset quality:  Gradual Duration:  2 days Timing:  Constant Progression:  Worsening Chronicity:  Recurrent Context: activity   Relieved by:  None tried Worsened by:  Nothing Ineffective treatments:  None tried Associated symptoms: abdominal pain        Past Medical History:  Diagnosis Date  . Atrial fibrillation ([place])   . Chronic respiratory failure with hypoxia, on home O2 therapy ([place])   . Diabetes mellitus without complication ([place])   . Diastolic CHF ([place])   . Hypertension     Patient Active Problem List   Diagnosis Date Noted  . Acute hypoxemic respiratory failure due to COVID-19 ([place]) 12/30/2019  . Diabetes mellitus without complication ([place])   . Acute on chronic diastolic CHF (congestive heart failure) ([place])   . AF (paroxysmal atrial fibrillation) ([place])   . CKD (chronic kidney disease), stage III   . Hypertension associated with diabetes ([place])     History reviewed. No pertinent surgical history.   OB History   No obstetric history on file.     Family History  Problem Relation Age of Onset  . Cancer Mother     Social History   Tobacco Use  . Smoking status: Never Smoker  . Smokeless tobacco: Never Used  Substance Use Topics  . Alcohol use: Never  . Drug use: Never    Home Medications Prior to Admission medications   Medication Sig Start Date End Date Taking? Authorizing Provider  carvedilol (COREG) 25 MG tablet Take 12.5 mg by mouth 2 (two) times daily with a meal.    Yes [provider]  ferrous sulfate 325 (65 FE) MG tablet Take 325 mg by  mouth daily with breakfast.   Yes [provider]  furosemide (LASIX) 40 MG tablet Take 40 mg by mouth daily.    Yes [provider]  glipiZIDE (GLUCOTROL XL) 10 MG 24 hr tablet Take 10 mg by mouth daily with breakfast.   Yes [provider]  lisinopril (ZESTRIL) 20 MG tablet Take 20 mg by mouth daily.   Yes [provider]  pantoprazole (PROTONIX) 40 MG tablet Take 40 mg by mouth daily.   Yes [provider]  potassium chloride SA (KLOR-CON) 20 MEQ tablet Take 20 mEq by mouth daily.   Yes [provider]  blood glucose meter kit and supplies Dispense based on patient and insurance preference. Use up to four times daily as directed. (FOR ICD-10 E10.9, E11.9). 01/02/20   Ghimire, Henreitta Leber, MD    Allergies    Codeine, Prednisone, and Moxifloxacin  Review of Systems   Review of Systems  Respiratory: Positive for shortness of breath.   Gastrointestinal: Positive for abdominal pain.  All other systems reviewed and are negative.   Physical Exam Updated Vital Signs BP (!) 145/83   Pulse (!) 46   Temp 98 F (36.7 C) (Oral)   Resp 18   Ht '5\' 1"'$  (1.549 m)   Wt 127.5 kg   SpO2 95%   BMI 53.11 kg/m   Physical Exam Vitals and nursing note reviewed.  Constitutional:      Appearance: She is well-developed.  HENT:     Head: Normocephalic and atraumatic.  Cardiovascular:     Rate and Rhythm: Normal rate and regular rhythm.  Pulmonary:     Effort: No respiratory distress.     Breath sounds: No stridor. Decreased breath sounds and rales (in bases) present.  Chest:     Chest wall: No mass or deformity.  Abdominal:     General: There is no distension.  Musculoskeletal:        General: Normal range of motion.     Cervical back: Normal range of motion.     Right lower leg: No edema.     Left lower leg: No edema.  Skin:    General: Skin is warm and dry.  Neurological:     Mental Status: She is alert.     ED Results / Procedures  / Treatments   Labs (all labs ordered are listed, but only abnormal results are displayed) Labs Reviewed  CBC WITH DIFFERENTIAL/PLATELET - Abnormal; Notable for the following components:      Result Value   RBC 3.63 (*)    Hemoglobin 9.8 (*)    HCT 31.5 (*)    RDW 16.2 (*)    Platelets 111 (*)    All other components within normal limits  COMPREHENSIVE METABOLIC PANEL - Abnormal; Notable for the following components:   Glucose, Bld 147 (*)    Creatinine, Ser 1.39 (*)    Total Protein 8.2 (*)    Total Bilirubin 2.3 (*)    GFR calc non Af Amer 39 (*)    GFR calc Af Amer 46 (*)    All other components within normal limits  BRAIN NATRIURETIC PEPTIDE - Abnormal; Notable for the following components:   B Natriuretic Peptide 675.6 (*)    All other components within normal limits  TROPONIN I (HIGH SENSITIVITY) - Abnormal; Notable for the following components:   Troponin I (High Sensitivity) 314 (*)    All other components within normal limits  TROPONIN I (HIGH SENSITIVITY) - Abnormal; Notable for the following components:   Troponin I (High Sensitivity) 285 (*)    All other components within normal limits  LIPASE, BLOOD    EKG EKG Interpretation  Date/Time:  Thursday June 15 2020 18:30:24 EDT Ventricular Rate:  71 PR Interval:    QRS Duration: 108 QT Interval:  362 QTC Calculation: 393 R Axis:   135 Text Interpretation: Atrial fibrillation with premature ventricular or aberrantly conducted complexes Right axis deviation ST & T wave abnormality, consider inferior ischemia Abnormal ECG difficult to interpret 2/2 artifact Confirmed by Merrily Pew [phone]) on 06/16/2020 2:08:00 AM   Radiology DG Chest 2 View  Result Date: 06/15/2020 CLINICAL DATA:  Shortness of breath, worsening today, hypertensive, previously diagnosed with COPD EXAM: CHEST - 2 VIEW COMPARISON:  Radiograph 05/08/2020 CT 10/29/2016 FINDINGS: Persistent bilateral effusions with dense opacity of the lung bases likely  reflecting a combination atelectasis and edema though certainly some underlying consolidation may be present as well. More bandlike areas of subsegmental atelectasis are present. Further features of edema include central vascular cuffing and congestion. Although portions of the cardiac silhouette are obscured, gross cardiomegaly is similar to prior. Atherosclerotic plaque within the normal caliber aorta. No acute osseous or soft tissue abnormality. Degenerative changes are present in the imaged spine and shoulders. Surgical clips again seen in the left axilla. IMPRESSION: 1. Persistent bilateral effusions with dense opacity of the lung  bases likely reflecting a combination atelectasis and edema though certainly some underlying consolidation may be present as well. 2. Further features of edema. Electronically Signed   By: Lovena Le M.D.   On: 06/15/2020 20:18    Procedures Procedures (including critical care time)  Medications Ordered in ED Medications  albuterol (VENTOLIN HFA) 108 (90 Base) MCG/ACT inhaler 2 puff (has no administration in time range)  albuterol (VENTOLIN HFA) 108 (90 Base) MCG/ACT inhaler 2 puff (2 puffs Inhalation Not Given 06/16/20 0218)  furosemide (LASIX) tablet 80 mg (80 mg Oral Given 06/16/20 0544)    ED Course  I have reviewed the triage vital signs and the nursing notes.  Pertinent labs & imaging results that were available during my care of the patient were reviewed by me and considered in my medical decision making (see chart for details).    MDM Rules/Calculators/A&P                          Slightly increased oxygen requirement seems like is probably related to a CHF exacerbation.  Gave her 80 mg of oral Lasix and start diuresis here.  She states she feels better from a breathing standpoint.  Troponins are flat.  Could admit for this but at same time I think this can probably managed as an outpatient if done correctly so I consulted Vickki Muff with the home  program helping her at home.  Final Clinical Impression(s) / ED Diagnoses Final diagnoses:  Shortness of breath  Acute on chronic congestive heart failure, unspecified heart failure type [facility])    Rx / DC Orders ED Discharge Orders    None       Gennifer Potenza, Corene Cornea, MD 06/19/20 1151

## 2020-06-17 DIAGNOSIS — N183 Chronic kidney disease, stage 3 unspecified: Secondary | ICD-10-CM | POA: Diagnosis present

## 2020-06-17 DIAGNOSIS — Z885 Allergy status to narcotic agent status: Secondary | ICD-10-CM | POA: Diagnosis not present

## 2020-06-17 DIAGNOSIS — R109 Unspecified abdominal pain: Secondary | ICD-10-CM | POA: Diagnosis present

## 2020-06-17 DIAGNOSIS — I130 Hypertensive heart and chronic kidney disease with heart failure and stage 1 through stage 4 chronic kidney disease, or unspecified chronic kidney disease: Secondary | ICD-10-CM | POA: Diagnosis present

## 2020-06-17 DIAGNOSIS — Z8616 Personal history of COVID-19: Secondary | ICD-10-CM | POA: Diagnosis not present

## 2020-06-17 DIAGNOSIS — R221 Localized swelling, mass and lump, neck: Secondary | ICD-10-CM | POA: Diagnosis present

## 2020-06-17 DIAGNOSIS — Z86711 Personal history of pulmonary embolism: Secondary | ICD-10-CM | POA: Diagnosis not present

## 2020-06-17 DIAGNOSIS — E1122 Type 2 diabetes mellitus with diabetic chronic kidney disease: Secondary | ICD-10-CM | POA: Diagnosis present

## 2020-06-17 DIAGNOSIS — R0602 Shortness of breath: Secondary | ICD-10-CM | POA: Diagnosis present

## 2020-06-17 DIAGNOSIS — Z9981 Dependence on supplemental oxygen: Secondary | ICD-10-CM | POA: Diagnosis not present

## 2020-06-17 DIAGNOSIS — G8929 Other chronic pain: Secondary | ICD-10-CM | POA: Diagnosis present

## 2020-06-17 DIAGNOSIS — I472 Ventricular tachycardia: Secondary | ICD-10-CM | POA: Diagnosis not present

## 2020-06-17 DIAGNOSIS — I4819 Other persistent atrial fibrillation: Secondary | ICD-10-CM | POA: Diagnosis present

## 2020-06-17 DIAGNOSIS — J9601 Acute respiratory failure with hypoxia: Secondary | ICD-10-CM | POA: Diagnosis present

## 2020-06-17 DIAGNOSIS — Z888 Allergy status to other drugs, medicaments and biological substances status: Secondary | ICD-10-CM | POA: Diagnosis not present

## 2020-06-17 DIAGNOSIS — Z6841 Body Mass Index (BMI) 40.0 and over, adult: Secondary | ICD-10-CM | POA: Diagnosis not present

## 2020-06-17 DIAGNOSIS — I152 Hypertension secondary to endocrine disorders: Secondary | ICD-10-CM | POA: Diagnosis present

## 2020-06-17 DIAGNOSIS — I5033 Acute on chronic diastolic (congestive) heart failure: Secondary | ICD-10-CM | POA: Diagnosis present

## 2020-06-17 DIAGNOSIS — Z7984 Long term (current) use of oral hypoglycemic drugs: Secondary | ICD-10-CM | POA: Diagnosis not present

## 2020-06-17 DIAGNOSIS — R17 Unspecified jaundice: Secondary | ICD-10-CM | POA: Diagnosis present

## 2020-06-17 DIAGNOSIS — I272 Pulmonary hypertension, unspecified: Secondary | ICD-10-CM | POA: Diagnosis present

## 2020-06-17 DIAGNOSIS — Z79899 Other long term (current) drug therapy: Secondary | ICD-10-CM | POA: Diagnosis not present

## 2020-06-17 LAB — CBC WITH DIFFERENTIAL/PLATELET
Abs Immature Granulocytes: 0.01 10*3/uL (ref 0.00–0.07)
Basophils Absolute: 0 10*3/uL (ref 0.0–0.1)
Basophils Relative: 0 %
Eosinophils Absolute: 0.2 10*3/uL (ref 0.0–0.5)
Eosinophils Relative: 3 %
HCT: 28 % — ABNORMAL LOW (ref 36.0–46.0)
Hemoglobin: 8.6 g/dL — ABNORMAL LOW (ref 12.0–15.0)
Immature Granulocytes: 0 %
Lymphocytes Relative: 21 %
Lymphs Abs: 1 10*3/uL (ref 0.7–4.0)
MCH: 27 pg (ref 26.0–34.0)
MCHC: 30.7 g/dL (ref 30.0–36.0)
MCV: 88.1 fL (ref 80.0–100.0)
Monocytes Absolute: 0.5 10*3/uL (ref 0.1–1.0)
Monocytes Relative: 10 %
Neutro Abs: 3.2 10*3/uL (ref 1.7–7.7)
Neutrophils Relative %: 66 %
Platelets: 102 10*3/uL — ABNORMAL LOW (ref 150–400)
RBC: 3.18 MIL/uL — ABNORMAL LOW (ref 3.87–5.11)
RDW: 16.3 % — ABNORMAL HIGH (ref 11.5–15.5)
WBC: 5 10*3/uL (ref 4.0–10.5)
nRBC: 0 % (ref 0.0–0.2)

## 2020-06-17 LAB — COMPREHENSIVE METABOLIC PANEL
ALT: 14 U/L (ref 0–44)
AST: 19 U/L (ref 15–41)
Albumin: 3.5 g/dL (ref 3.5–5.0)
Alkaline Phosphatase: 70 U/L (ref 38–126)
Anion gap: 11 (ref 5–15)
BUN: 19 mg/dL (ref 8–23)
CO2: 28 mmol/L (ref 22–32)
Calcium: 8.9 mg/dL (ref 8.9–10.3)
Chloride: 103 mmol/L (ref 98–111)
Creatinine, Ser: 1.73 mg/dL — ABNORMAL HIGH (ref 0.44–1.00)
GFR calc Af Amer: 35 mL/min — ABNORMAL LOW (ref >60)
GFR calc non Af Amer: 30 mL/min — ABNORMAL LOW (ref >60)
Glucose, Bld: 187 mg/dL — ABNORMAL HIGH (ref 70–99)
Potassium: 3.7 mmol/L (ref 3.5–5.1)
Sodium: 142 mmol/L (ref 135–145)
Total Bilirubin: 2 mg/dL — ABNORMAL HIGH (ref 0.3–1.2)
Total Protein: 7.2 g/dL (ref 6.5–8.1)

## 2020-06-17 LAB — MAGNESIUM: Magnesium: 2.3 mg/dL (ref 1.7–2.4)

## 2020-06-17 LAB — GLUCOSE, CAPILLARY
Glucose-Capillary: 150 mg/dL — ABNORMAL HIGH (ref 70–99)
Glucose-Capillary: 158 mg/dL — ABNORMAL HIGH (ref 70–99)
Glucose-Capillary: 166 mg/dL — ABNORMAL HIGH (ref 70–99)
Glucose-Capillary: 180 mg/dL — ABNORMAL HIGH (ref 70–99)

## 2020-06-17 MED ORDER — FUROSEMIDE 10 MG/ML IJ SOLN
40.0000 mg | Freq: Once | INTRAMUSCULAR | Status: AC
  Administered 2020-06-17: 40 mg via INTRAVENOUS
  Filled 2020-06-17: qty 4

## 2020-06-17 MED ORDER — FUROSEMIDE 10 MG/ML IJ SOLN
80.0000 mg | Freq: Once | INTRAMUSCULAR | Status: AC
  Administered 2020-06-17: 80 mg via INTRAVENOUS
  Filled 2020-06-17: qty 8

## 2020-06-17 NOTE — Plan of Care (Signed)

## 2020-06-17 NOTE — Evaluation (Addendum)
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 Found to have acute respiratory failure with hypoxia   Clinical Impression   This 64 yo female admitted with above presents to acute OT with PLOF of being able to do most of her basic ADLs by herself (occasional A from husband for socks and shoes). Currently she needs overall S with minguard A with ambulation--she has A/S from husband prn. No further OT needs, we will sign off  Sats remained in low to mid 90's throughout session with HR at rest 50-70s and with activity up to 90s.     Follow Up Recommendations  No OT follow up;Supervision - Intermittent    Equipment Recommendations  None recommended by OT       Precautions / Restrictions Precautions Precautions: Fall Precaution Comments: monitor sats and HR Restrictions Weight Bearing Restrictions: No      Mobility Bed Mobility Overal bed mobility: Modified Independent             General bed mobility comments: HOB up  Transfers Overall transfer level: Needs assistance Equipment used: Rolling walker (2 wheeled) Transfers: Sit to/from Stand Sit to Stand: Supervision         General transfer comment: VCs for safe hand placement    Balance Overall balance assessment: Mild deficits observed, not formally tested                                         ADL either performed or assessed with clinical judgement   ADL Overall ADL's : Needs assistance/impaired Eating/Feeding: Independent;Sitting   Grooming: Wash/dry hands;Supervision/safety;Standing Grooming Details (indicate cue type and  reason): at sink Upper Body Bathing: Set up;Sitting   Lower Body Bathing: Minimal assistance Lower Body Bathing Details (indicate cue type and reason): for feet , S sit<>stand (VCs for safe hand placement) Upper Body Dressing : Set up;Sitting   Lower Body Dressing: Minimal assistance Lower Body Dressing Details (indicate cue type and reason): socks , S sit<>stand (VCs for safe hand placement) Toilet Transfer: Min guard;Ambulation;RW;Comfort height toilet;Grab bars   Toileting- Clothing Manipulation and Hygiene: Supervision/safety;Sit to/from stand               Vision Patient Visual Report: No change from baseline              Pertinent Vitals/Pain Pain Assessment: No/denies pain     Hand Dominance Right   Extremity/Trunk Assessment Upper Extremity Assessment Upper Extremity Assessment: Overall WFL for tasks assessed           Communication Communication Communication: No difficulties   Cognition Arousal/Alertness: Awake/alert Behavior During Therapy: WFL for tasks assessed/performed Overall Cognitive Status: Within Functional Limits for tasks assessed                                                Home Living Family/patient expects to be discharged to:: Private residence Living Arrangements: Spouse/significant other Available Help at  Discharge: Family;Available 24 hours/day Type of Home: House Home Access: Stairs to enter [employer] of Steps: 2 front 2 back Entrance Stairs-Rails: Right Home Layout: One level     Bathroom Shower/Tub: Tub/shower unit (pt sponges baths)   Bathroom Toilet: Handicapped height     Home Equipment: Cane - single point;Grab bars - tub/shower   Additional Comments: Home O2 - 2L      Prior Functioning/Environment Level of Independence: Independent with assistive device(s)        Comments: Uses a cane for mobility. Performs ADLs (husband sometime A with socks and shoes sometimes). husband  performs IADLs.        OT Problem List: Cardiopulmonary status limiting activity;Decreased activity tolerance;Impaired balance (sitting and/or standing)         OT Goals(Current goals can be found in the care plan section) Acute Rehab OT Goals Patient Stated Goal: to go home OT Goal Formulation: With patient Time For Goal Achievement: 07/01/20 Potential to Achieve Goals: Good  OT Frequency:                AM-PAC OT "6 Clicks" Daily Activity     Outcome Measure Help from another person eating meals?: None Help from another person taking care of personal grooming?: A Little Help from another person toileting, which includes using toliet, bedpan, or urinal?: A Little Help from another person bathing (including washing, rinsing, drying)?: A Little Help from another person to put on and taking off regular upper body clothing?: A Little Help from another person to put on and taking off regular lower body clothing?: A Little 6 Click Score: 19   End of Session Equipment Utilized During Treatment: Gait belt;Rolling walker;Oxygen (2 liters)  Activity Tolerance: Patient tolerated treatment well Patient left:  (ambulating with PT)  OT Visit Diagnosis: Other abnormalities of gait and mobility (R26.89);Muscle weakness (generalized) (M62.81)                Time: 6294-7654 OT Time Calculation (min): 17 min Charges:  OT General Charges $OT Visit: 1 Visit OT Evaluation $OT Eval Moderate Complexity: [place], OTR/L Acute [employer] Pager [phone] Office [phone]     Almon Register 06/17/2020, 1:39 PM

## 2020-06-17 NOTE — Progress Notes (Signed)
Subjective: Patient reports that she is feeling well today, states that her breathing has improved.  She is currently on 2 L nasal cannula, states that she is on this at home.  States she did not know she received IV Lasix yesterday, states that she has not been urinating much.  She states that she was eating an increased amount of cantaloupe and has eaten some [employer] chicken which she feels is the cause of her exacerbation.  She denies any chest pain, palpitations, headaches, lightheadedness, dizziness, or other symptoms.  She states that she does not check her weights at home, does not know what her dry weight is.  We discussed what a dry weight was and that typically our goal is to get her to her dry weight before she leaves. Discussed the importance of diet and fluid restriction, and monitoring her weight at home. We also discussed that she had a bump in her creatinine today, discussed that we would like to continue to monitor her and make sure that the kidney function improves. We discussed that this may have been because she received high doses of lasix yesterday. She did report that she would like to leave the hospital soon however she will stay for today.  Objective:  Vital signs in last 24 hours: Vitals:   06/16/20 1456 06/16/20 1615 06/16/20 2132 06/17/20 0449  BP: (!) 150/67 (!) 145/71 111/64   Pulse: 78 70 67   Resp: 16 14 20    Temp:  98.2 F (36.8 C) 98.1 F (36.7 C)   TempSrc:   Oral   SpO2: 93% 100% 100%   Weight:    121.3 kg  Height:       General: Middle-aged female, no acute distress, laying in bed Cardiac: Regular rate and rhythm, no murmurs rubs or gallops Pulmonary: Bibasilar crackles, normal work of breathing, no wheezing or rhonchi, on 2 L nasal cannula Abdomen: Soft,, nontender, nondistended, normoactive bowel sounds Extremities: 1+ bilateral lower extremity swelling  Assessment/Plan:  Active Problems:   Acute respiratory failure with hypoxia (HCC)  This is a 64  year old female with a hsitory of HTN, PE, T2DM, COVID-19(1/21), GI bleeds 2/2 AVM, pulmonary HTN, persistent atrial fibrillation, and diastolic HF on supplamental O2 presenting with a 1 week history of worsening SOB. Noted with have findings concerning for a HFpEF exacerbation.   HFpEF exacerbation: Pulmonary HTN: Likely 2/2 increased fluid intake and dietary indiscretion. Patient received 1 dose PO lasix 80 mg and then 1 dose IV lasix 80 yesterday. On 2L [place] at thiat time. VSS. Had a net output of 1.3L yesterday. Weight is 121.3 kg from 127.5 kg yesterday, likely not accurate. Unclear dry weight however weight on discharge from [place] was 107 kg. On exam she appears to still be mildly fluid overloaded. She is still on [facility], which she is on at home however not consistently. She had a bump in her Cr, from 1.35 to 1.73, likely from the high amount of lasix given. Since she is still volume overloaded will give a lower dose of IV lasix today and monitor output and kidney function.   -IV lasix 40 mg once, then 40 mg later today if good output in AM -Strict I+Os, daily weights -Hold home lisinopril in setting of AKI -Continue home Coreg 12.5 mg BID -PT/OT -Daily BMP  Persistent Atrial Fibrillation Rate controlled on Coreg. Stable, not on anticoagulation due to history of GI bleeds.  -Continue coreg.   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 She has had extensive work-up for her chronic abdominal pain and did have positive Direct coombs on 04/22/20 and 04/12/20 in addition to positive cold agglutinins on 5/05, which was normal on 5/15. She also has had capsule endoscopy, unable to see records. CT enterography 04/04/20 with only small cystic pancreatic lesion. Colonoscopy 3/21 normal. Bilirubin has improved today at 2. Denies any abdominal pain today. Is following with outpatient GI and has follow up with them.   -  hemoglobin stable, follow-up with GI for planned small bowel enteroscopy  - indirect bili ordered - per GI note 04/25/20 hemolysis work-up negative.  - cont. protonix 40 mg qd  Type II DM Last a1c 6.1 in May. Glucose around 150-187 here.  - SSI  Prior to Admission Living Arrangement: Home Anticipated Discharge Location: Home Barriers to Discharge:  Continued medical management Dispo: Anticipated discharge in approximately 0-1 day(s).   Asencion Noble, MD 06/17/2020, 6:26 AM Pager: [phone] After 5pm on weekdays and 1pm on weekends: On Call pager [phone]

## 2020-06-17 NOTE — Evaluation (Signed)
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 Found to have acute respiratory failure with hypoxia  Clinical Impression  Pt admitted with above diagnosis. Ambulatory with RW, distance limited due to fatigue/deconditining. PTA pt independent for the most part, and lives with husband and son. Will have enough support to safely mobilize at home but may benefit from HHPT to help with her strength and conditioning, elevating her back to baseline PTA. Will continue to follow until d/c. Pt will benefit from skilled PT to increase their independence and safety with mobility to allow discharge to the venue listed below.       Follow Up Recommendations Home health PT (For home safety evaluation and deconditioning)    Equipment Recommendations  Other (comment) (Rollator)    Recommendations for Other Services       Precautions / Restrictions Precautions Precautions: Fall Precaution Comments: monitor sats and HR Restrictions Weight Bearing Restrictions: No      Mobility  Bed Mobility Overal bed mobility: Modified Independent             General bed mobility comments: Standing in room  Transfers Overall transfer level: Needs assistance Equipment used: Rolling walker (2 wheeled) Transfers: Sit to/from Stand Sit to Stand: Supervision         General transfer comment: VCs for safe hand placement  Ambulation/Gait Ambulation/Gait assistance: Supervision Gait Distance (Feet): 60 Feet Assistive device: Rolling walker (2 wheeled) Gait Pattern/deviations: Step-through pattern;Decreased stride length;Trunk flexed Gait velocity: slow Gait  velocity interpretation: <1.31 ft/sec, indicative of household ambulator General Gait Details: Slow gait reliant on RW for support. Cues for proximity to device, proper use. Intermittent rest breaks due to fatigue. Leaning over walker frequently to recover.  Stairs            Wheelchair Mobility    Modified Rankin (Stroke Patients Only)       Balance Overall balance assessment: Mild deficits observed, not formally tested                                           Pertinent Vitals/Pain Pain Assessment: No/denies pain    Home Living Family/patient expects to be discharged to:: Private residence Living Arrangements: Spouse/significant other Available Help at Discharge: Family;Available 24 hours/day Type of Home: House Home Access: Stairs to enter Entrance Stairs-Rails: Right Entrance Stairs-Number of Steps: 2 front 2 back Home Layout: One level Home Equipment: Cane - single point;Grab bars - tub/shower Additional Comments: Home O2 - 2L    Prior Function Level of Independence: Independent with assistive device(s)         Comments: Uses a cane for mobility. Performs ADLs (husband sometime A with socks and shoes sometimes). husband performs IADLs.     Hand Dominance   Dominant Hand: Right    Extremity/Trunk Assessment   Upper Extremity Assessment Upper Extremity Assessment: Defer to OT evaluation    Lower Extremity Assessment Lower Extremity Assessment: Generalized weakness       Communication   Communication: No difficulties  Cognition Arousal/Alertness: Awake/alert Behavior During Therapy: WFL for tasks assessed/performed  Overall Cognitive Status: Within Functional Limits for tasks assessed                                        General Comments General comments (skin integrity, edema, etc.): SpO2 92% on 2L supplemental O2, HR 86 while ambulating    Exercises General Exercises - Lower Extremity Ankle Circles/Pumps:  AROM;Both;10 reps;Seated Gluteal Sets: Strengthening;Both;10 reps;Seated Long Arc Quad: Strengthening;Both;5 reps;Seated Hip ABduction/ADduction: Strengthening;Both;5 reps;Seated Hip Flexion/Marching: Strengthening;Both;5 reps;Seated   Assessment/Plan    PT Assessment Patient needs continued PT services  PT Problem List Decreased strength;Decreased activity tolerance;Decreased balance;Decreased mobility;Decreased knowledge of use of DME;Cardiopulmonary status limiting activity;Obesity       PT Treatment Interventions DME instruction;Gait training;Functional mobility training;Therapeutic activities;Therapeutic exercise;Balance training;Neuromuscular re-education    PT Goals (Current goals can be found in the Care Plan section)  Acute Rehab PT Goals Patient Stated Goal: to go home PT Goal Formulation: With patient Time For Goal Achievement: 07/01/20 Potential to Achieve Goals: Good    Frequency Min 3X/week   Barriers to discharge        Co-evaluation               AM-PAC PT "6 Clicks" Mobility  Outcome Measure Help needed turning from your back to your side while in a flat bed without using bedrails?: None Help needed moving from lying on your back to sitting on the side of a flat bed without using bedrails?: None Help needed moving to and from a bed to a chair (including a wheelchair)?: None Help needed standing up from a chair using your arms (e.g., wheelchair or bedside chair)?: None Help needed to walk in hospital room?: A Little Help needed climbing 3-5 steps with a railing? : A Lot 6 Click Score: 21    End of Session Equipment Utilized During Treatment: Gait belt;Oxygen Activity Tolerance: Patient limited by fatigue Patient left: in chair;with call bell/phone within reach;with chair alarm set Nurse Communication: Mobility status PT Visit Diagnosis: Muscle weakness (generalized) (M62.81);Difficulty in walking, not elsewhere classified (R26.2)    Time:  2820-6015 PT Time Calculation (min) (ACUTE ONLY): 14 min   Charges:              Elayne Snare, PT   Ellouise Newer 06/17/2020, 1:57 PM

## 2020-06-18 DIAGNOSIS — R221 Localized swelling, mass and lump, neck: Secondary | ICD-10-CM | POA: Diagnosis present

## 2020-06-18 LAB — CBC
HCT: 26.9 % — ABNORMAL LOW (ref 36.0–46.0)
Hemoglobin: 8.5 g/dL — ABNORMAL LOW (ref 12.0–15.0)
MCH: 27.7 pg (ref 26.0–34.0)
MCHC: 31.6 g/dL (ref 30.0–36.0)
MCV: 87.6 fL (ref 80.0–100.0)
Platelets: 107 10*3/uL — ABNORMAL LOW (ref 150–400)
RBC: 3.07 MIL/uL — ABNORMAL LOW (ref 3.87–5.11)
RDW: 16.1 % — ABNORMAL HIGH (ref 11.5–15.5)
WBC: 5.5 10*3/uL (ref 4.0–10.5)
nRBC: 0 % (ref 0.0–0.2)

## 2020-06-18 LAB — COMPREHENSIVE METABOLIC PANEL
ALT: 13 U/L (ref 0–44)
AST: 23 U/L (ref 15–41)
Albumin: 3.3 g/dL — ABNORMAL LOW (ref 3.5–5.0)
Alkaline Phosphatase: 70 U/L (ref 38–126)
Anion gap: 10 (ref 5–15)
BUN: 26 mg/dL — ABNORMAL HIGH (ref 8–23)
CO2: 30 mmol/L (ref 22–32)
Calcium: 8.8 mg/dL — ABNORMAL LOW (ref 8.9–10.3)
Chloride: 101 mmol/L (ref 98–111)
Creatinine, Ser: 1.8 mg/dL — ABNORMAL HIGH (ref 0.44–1.00)
GFR calc Af Amer: 33 mL/min — ABNORMAL LOW (ref >60)
GFR calc non Af Amer: 29 mL/min — ABNORMAL LOW (ref >60)
Glucose, Bld: 181 mg/dL — ABNORMAL HIGH (ref 70–99)
Potassium: 3.7 mmol/L (ref 3.5–5.1)
Sodium: 141 mmol/L (ref 135–145)
Total Bilirubin: 1.6 mg/dL — ABNORMAL HIGH (ref 0.3–1.2)
Total Protein: 7.1 g/dL (ref 6.5–8.1)

## 2020-06-18 LAB — GLUCOSE, CAPILLARY
Glucose-Capillary: 178 mg/dL — ABNORMAL HIGH (ref 70–99)
Glucose-Capillary: 185 mg/dL — ABNORMAL HIGH (ref 70–99)
Glucose-Capillary: 135 mg/dL — ABNORMAL HIGH (ref 70–99)
Glucose-Capillary: 186 mg/dL — ABNORMAL HIGH (ref 70–99)

## 2020-06-18 LAB — MAGNESIUM: Magnesium: 2.2 mg/dL (ref 1.7–2.4)

## 2020-06-18 MED ORDER — FUROSEMIDE 10 MG/ML IJ SOLN
80.0000 mg | Freq: Two times a day (BID) | INTRAMUSCULAR | Status: DC
  Administered 2020-06-18 – 2020-06-20 (×5): 80 mg via INTRAVENOUS
  Filled 2020-06-18 (×5): qty 8

## 2020-06-18 NOTE — Progress Notes (Signed)
Subjective: Patient reports that she is feeling better today, states that her breathing has improved. She also feels that her lower extremity swelling is improved. She feels like she is urinating about the same as she does at home. We discussed that she still appears fluid overloaded today and that we will need to continue IV diuresis. She reported that she would like to go home tomorrow, however is agreeable to staying today.  Also noted that she has a thickened nodule on the front side of her neck, she reports that this is always been there. She is not sure if it has been imaged. She reported that she would consider this to her primary care physician.   Objective:  Vital signs in last 24 hours: Vitals:   06/17/20 0740 06/17/20 1634 06/17/20 2050 06/17/20 2306  BP: (!) 114/58 (!) 112/52 118/66 131/60  Pulse: 71 (!) 59 60 64  Resp: 16 16 19 18   Temp: 98.6 F (37 C) 98.3 F (36.8 C) 97.9 F (36.6 C) 98 F (36.7 C)  TempSrc: Oral Oral Oral Oral  SpO2: 98% 98% 100% 98%  Weight:      Height:       General: Middle-aged female, no acute distress, sitting in chair Cardiac: RRR, no m/r/g, 1+ BL LE swelling, elevated JVD Pulmonary: Bibasilar crackles, no wheezing, on 2L Abdomen: Soft, non-tender, normoactive bowel sounds  Assessment/Plan:  Principal Problem:   Acute on chronic diastolic CHF (congestive heart failure) (HCC) Active Problems:   Diabetes mellitus without complication (HCC)   AF (paroxysmal atrial fibrillation) (HCC)   CKD (chronic kidney disease), stage III   Hypertension associated with diabetes (HCC)   Acute respiratory failure with hypoxia (HCC)  This is a 64 year old female with a hsitory of HTN, PE, T2DM, COVID-19(1/21), GI bleeds 2/2 AVM, pulmonary HTN, persistent atrial fibrillation, and diastolic HF on supplamental O2 presenting with a 1 week history of worsening SOB. Noted with have findings concerning for a HFpEF exacerbation.   HFpEF  exacerbation: Pulmonary HTN: Likely 2/2 increased fluid intake and dietary indiscretion. Patient received 40 mg IV lasix in AM and 80 mg IV lasix in PM yesterday. Minimal output was recorded, about 200 cc in AM and 500 cc in PM. Weight was not logged today, 121 kg yesterday. Unclear dry wieght however was 107 kg recently. Remains on 2L [place]. VSS. BMP showed Cr 1.8, up from 1.7 yesterday. On exam she appears to still be volume overloaded, with bibasilar crackles and elevated JVD. Will need to continue diuresis. Patient reported wanting to leave tomorrow, hopefully will be able to wean down the oxygen before then.   -Lasix 80 mg IV BID -Strict I+Os, daily weights -Hold home lisinopril in setting of AKI -Continue home Coreg 12.5 mg BID -PT/OT -Daily BMP  Ventricular tachycardia:  4 beat run noted on telemetry. Patient asymptomatic at that time. Mag and K normal. Will continue to monitor electrolytes.  -BMP in AM  Persistent Atrial Fibrillation Rate controlled on Coreg. Stable, not on anticoagulation due to history of GI bleeds.  -Continue coreg.   Anterior neck nodule: Concerning for goiter. Patient reports that this has been there for awhile, no pain on palpation. No imaging noted in chart. Will need outpatient ultrasound to further evaluate.   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 She has had extensive work-up for her chronic abdominal pain and did have positive Direct  coombs on 04/22/20 and 04/12/20 in addition to positive cold agglutinins on 5/05, which was normal on 5/15. She also has had capsule endoscopy, unable to see records. CT enterography 04/04/20 with only small cystic pancreatic lesion. Colonoscopy 3/21 normal. Bilirubin has improved today at 2. Denies any abdominal pain today. Is following with outpatient GI and has follow up with them.   - hemoglobin stable, follow-up with GI for planned  small bowel enteroscopy  - indirect bili ordered - per GI note 04/25/20 hemolysis work-up negative.  - cont. protonix 40 mg qd  Type II DM Last a1c 6.1 in May. Glucose around 150-187 here.  - SSI  Prior to Admission Living Arrangement: Home Anticipated Discharge Location: Home Barriers to Discharge:  Continued medical management Dispo: Anticipated discharge in approximately 1-2 day(s).   Asencion Noble, MD 06/18/2020, 6:00 AM Pager: [phone] After 5pm on weekdays and 1pm on weekends: On Call pager [phone]

## 2020-06-19 LAB — BASIC METABOLIC PANEL
Anion gap: 8 (ref 5–15)
BUN: 24 mg/dL — ABNORMAL HIGH (ref 8–23)
CO2: 33 mmol/L — ABNORMAL HIGH (ref 22–32)
Calcium: 9.1 mg/dL (ref 8.9–10.3)
Chloride: 101 mmol/L (ref 98–111)
Creatinine, Ser: 1.55 mg/dL — ABNORMAL HIGH (ref 0.44–1.00)
GFR calc Af Amer: 40 mL/min — ABNORMAL LOW (ref >60)
GFR calc non Af Amer: 35 mL/min — ABNORMAL LOW (ref >60)
Glucose, Bld: 147 mg/dL — ABNORMAL HIGH (ref 70–99)
Potassium: 3.5 mmol/L (ref 3.5–5.1)
Sodium: 142 mmol/L (ref 135–145)

## 2020-06-19 LAB — GLUCOSE, CAPILLARY
Glucose-Capillary: 114 mg/dL — ABNORMAL HIGH (ref 70–99)
Glucose-Capillary: 107 mg/dL — ABNORMAL HIGH (ref 70–99)
Glucose-Capillary: 145 mg/dL — ABNORMAL HIGH (ref 70–99)
Glucose-Capillary: 171 mg/dL — ABNORMAL HIGH (ref 70–99)
Glucose-Capillary: 190 mg/dL — ABNORMAL HIGH (ref 70–99)

## 2020-06-19 NOTE — Progress Notes (Signed)
Subjective: Interviewed patient in chair. She reports feeling much better today. She reports her breathing is much improved. She reported getting a good nights sleep. She asked about going home today. We discussed that due to still having lower extremity edema we felt it would be best to have one more night in the hospital. She was a little disappointed but understanding of our plan. She had no further questions.  Objective:  Vital signs in last 24 hours: Vitals:   06/18/20 0800 06/18/20 1647 06/18/20 1953 06/18/20 2244  BP: (!) 143/86 128/62 137/67 123/66  Pulse: (!) 57  80 63  Resp:   18 20  Temp: 97.7 F (36.5 C)  98.2 F (36.8 C) 98.6 F (37 C)  TempSrc: Oral  Oral Oral  SpO2: 92%  98% 97%  Weight:      Height:       Physical Exam Vitals and nursing note reviewed.  Constitutional:      General: She is not in acute distress.    Appearance: She is well-developed. She is not ill-appearing or toxic-appearing.  HENT:     Head: Normocephalic and atraumatic.  Eyes:     Extraocular Movements: Extraocular movements intact.  Cardiovascular:     Rate and Rhythm: Normal rate and regular rhythm.     Pulses: Normal pulses.          Radial pulses are 2+ on the right side and 2+ on the left side.       Dorsalis pedis pulses are 2+ on the right side and 2+ on the left side.     Heart sounds: Normal heart sounds, S1 normal and S2 normal. No murmur heard.   Pulmonary:     Effort: Pulmonary effort is normal. No respiratory distress.     Breath sounds: No stridor. No wheezing.  Abdominal:     Palpations: There is no mass.     Tenderness: There is no abdominal tenderness.     Hernia: No hernia is present.  Musculoskeletal:        General: No tenderness.     Right lower leg: 1+ Edema present.     Left lower leg: 1+ Edema present.  Neurological:     General: No focal deficit present.     Mental Status: She is alert.  Psychiatric:        Mood and Affect: Mood normal.         Behavior: Behavior normal.        Thought Content: Thought content normal.      Assessment/Plan: Mrs. 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 Noted with have findings concerning for a HFpEF exacerbation.   Principal Problem:   Acute on chronic diastolic CHF (congestive heart failure) (HCC) Active Problems:   Diabetes mellitus without complication (HCC)   AF (paroxysmal atrial fibrillation) (HCC)   CKD (chronic kidney disease), stage III   Hypertension associated with diabetes ([place])   Acute respiratory failure with hypoxia (HCC)   Neck mass  HFpEF exacerbation: Pulmonary HTN: Likely 2/2 increased fluid intake and dietary indiscretion. Patient received 80 mg IV lasix in AM and 80 mg IV lasix in PM yesterday.Net output was 600cc. Remains on 2L [place]. VSS. BMP showed Cr 1.55, down from 1.8 yesterday. On exam she still has bilateral lower extremity edema  but her breathing is much improved. Will need to continue to diuresis.  -Continue IV lasix 80 mg BID  Ventricular tachycardia:  4 beat run noted on telemetry on 7/11. Patient asymptomatic at that time. Mag and K normal. Will continue to monitor electrolytes.  -BMP showed electrolytes within normal limits -Will continue to monitor   Persistent Atrial Fibrillation Rate controlled on Coreg. Stable, not on anticoagulation due to history of GI bleeds.  -Continue coreg 12.5mg  BID  Anterior neck nodule: Concerning for goiter. Patient reports that this has been there for awhile, no pain on palpation. No imaging noted in chart.   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 She has had extensive work-up for her chronic  abdominal pain and did have positive Direct coombs on 04/22/20 and 04/12/20 in addition to positive cold agglutinins on 5/05, which was normal on 5/15. She also has had capsule endoscopy, unable to see records. CT enterography 04/04/20 with only small cystic pancreatic lesion. Colonoscopy 3/21 normal. Bilirubin has improved today at 1.6. Denies any abdominal pain today. Is following with outpatient GI and has follow up with them.  - hemoglobin stable, follow-up with GI for planned small bowel enteroscopy  - indirect bili ordered - per GI note 04/25/20 hemolysis work-up negative.  - continue protonix 40 mg daily   Type II DM Last a1c 6.1 in May. -Continue monitoring glucose  -Continue sliding scale    Prior to Admission Living Arrangement:home Anticipated Discharge Location: home Barriers to Discharge: medical management Dispo: Anticipated discharge in approximately 1 day(s).   Briant Cedar, MD 06/19/2020, 6:07 AM Pager: [phone] After 5pm on weekdays and 1pm on weekends: On Call pager [phone]

## 2020-06-19 NOTE — Discharge Summary (Addendum)
Name: Rita [name] MRN: [id number] DOB: [birth year] 64 y.o. PCP: [name], MD  Date of Admission: 06/15/2020  6:22 PM Date of Discharge: 06/20/2020 Attending Physician: Lucious Groves, DO  Discharge Diagnosis: 1. HFpEF exacerbation  Discharge Medications: Allergies as of 06/20/2020      Reactions   Codeine    Mouth sores    Prednisone    Delusions    Moxifloxacin Rash      Medication List    TAKE these medications   blood glucose meter kit and supplies Dispense based on patient and insurance preference. Use up to four times daily as directed. (FOR ICD-10 E10.9, E11.9).   carvedilol 25 MG tablet Commonly known as: COREG Take 12.5 mg by mouth 2 (two) times daily with a meal.   ferrous sulfate 325 (65 FE) MG tablet Take 325 mg by mouth daily with breakfast.   furosemide 40 MG tablet Commonly known as: LASIX Take 40 mg by mouth daily.   glipiZIDE 10 MG 24 hr tablet Commonly known as: GLUCOTROL XL Take 10 mg by mouth daily with breakfast.   lisinopril 20 MG tablet Commonly known as: ZESTRIL Take 20 mg by mouth daily.   pantoprazole 40 MG tablet Commonly known as: PROTONIX Take 40 mg by mouth daily.   potassium chloride SA 20 MEQ tablet Commonly known as: KLOR-CON Take 20 mEq by mouth daily.            Durable Medical Equipment  (From admission, onward)         Start     Ordered   06/20/20 0000  For home use only DME Other see comment       Comments: Rollator  Question:  Length of Need  Answer:  12 Months   06/20/20 1408          Disposition and follow-up:   [name] was discharged from [facility] in Stable condition.  At the hospital follow up visit please address:  1. HFpEF exacerbation: Arrived at hospital with severe fluid overload with elevate JVD, bibasilar crackles, and bilateral lower edema. Gave multiple doses of IV lasix's to diuresis. Initially needed 2L/min via nasal canula and  was able to wean off. Please ensure you take all diuretic medications as ordered and try to eat a more balanced heart healthy diet.   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 Please continue to follow up with GI.  Anterior neck nodule: Schedule an Ultra Sound outpatient to follow up on this nodule.  Type II Diabetes: Please discuss with your primary care provider about the possibility of switching your glipizide to Augustina Mood, or [place] as they have shown dual benefit in CHF and diabetic control.  2.  Labs / imaging needed at time of follow-up: Neck ultra sound  3.  Pending labs/ test needing follow-up: small bowel enteroscopy planned with GI  Follow-up Appointments:  Follow-up Information    [name], MD. Schedule an appointment as soon as possible for a visit in 1 week(s).   Specialty: Internal Medicine Contact information: [place] [address] [place] Hospital Course by problem list: 1.  HFpEF exacerbation: Arrived at hospital with severe fluid overload with elevate JVD, bibasilar crackles, and bilateral lower edema. Gave multiple doses of IV  lasix's to diuresis. Initially needed 2L/min via nasal canula and was able to wean off. With the difficulty breathing resolved and the edema resolved was able to be discharged.   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 Will continue to be addressed by GI as outpatient.  Anterior neck nodule: During physical exam a nodule on the anterior neck was discovered. Schedule an Ultra Sound outpatient to follow up on this nodule.  Discharge Vitals:   BP (!) 105/57   Pulse 74   Temp 98.3 F (36.8 C)   Resp 19   Ht '5\' 1"'$  (1.549 m)   Wt 117.3 kg   SpO2 96%   BMI 48.88 kg/m   Pertinent Labs,  Studies, and Procedures:  CBC Latest Ref Rng & Units 06/18/2020 06/17/2020 06/16/2020  WBC 4.0 - 10.5 K/uL 5.5 5.0 5.4  Hemoglobin 12.0 - 15.0 g/dL 8.5(L) 8.6(L) 9.8(L)  Hematocrit 36 - 46 % 26.9(L) 28.0(L) 31.5(L)  Platelets 150 - 400 K/uL 107(L) 102(L) 111(L)   BMP Latest Ref Rng & Units 06/19/2020 06/18/2020 06/17/2020  Glucose 70 - 99 mg/dL 147(H) 181(H) 187(H)  BUN 8 - 23 mg/dL 24(H) 26(H) 19  Creatinine 0.44 - 1.00 mg/dL 1.55(H) 1.80(H) 1.73(H)  Sodium 135 - 145 mmol/L 142 141 142  Potassium 3.5 - 5.1 mmol/L 3.5 3.7 3.7  Chloride 98 - 111 mmol/L 101 101 103  CO2 22 - 32 mmol/L 33(H) 30 28  Calcium 8.9 - 10.3 mg/dL 9.1 8.8(L) 8.9   Chest X: Showed persistent bilateral effusions with dense opacity of the lung bases likely reflecting a combination atelectasis and edema though certainly some underlying consolidation may be present as well. Further features of edema.  Discharge Instructions: Discharge Instructions    (HEART FAILURE PATIENTS) Call MD:  Anytime you have any of the following symptoms: 1) 3 pound weight gain in 24 hours or 5 pounds in 1 week 2) shortness of breath, with or without a dry hacking cough 3) swelling in the hands, feet or stomach 4) if you have to sleep on extra pillows at night in order to breathe.   Complete by: As directed    Call MD for:  difficulty breathing, headache or visual disturbances   Complete by: As directed    Call MD for:  extreme fatigue   Complete by: As directed    Call MD for:  hives   Complete by: As directed    Call MD for:  persistant dizziness or light-headedness   Complete by: As directed    Call MD for:  persistant nausea and vomiting   Complete by: As directed    Call MD for:  redness, tenderness, or signs of infection (pain, swelling, redness, odor or green/yellow discharge around incision site)   Complete by: As directed    Call MD for:  severe uncontrolled pain   Complete by: As directed    Call MD for:  temperature >100.4    Complete by: As directed    Diet - low sodium heart healthy   Complete by: As directed    Discharge instructions   Complete by: As directed    It was a pleasure taking care of you. We admitted you for your shortness of breath due to an exacerbation of your Heart Failure. While you were here we gave you stronger doses of your lasix medication to get the excess fluid off of you. To  decrease the chance of this happening please try to follow a heart healthy diet. Also when you see your primary care provider ask if there would be benefit in changing your glipizide medication to Augustina Mood, or Jardiance due to their dual benefit of CHF and diabetic control. Thank you.   For home use only DME Other see comment   Complete by: As directed    Rollator   Length of Need: 12 Months   Increase activity slowly   Complete by: As directed       Signed: Briant Cedar, MD 06/20/2020, 3:19 PM   Pager: [phone]

## 2020-06-19 NOTE — TOC Initial Note (Signed)
Transition of Care [facility]) - Initial/Assessment Note    Patient Details  Name: Rita [name] MRN: [id number] Date of Birth: [birth year]  Transition of Care [facility]) CM/SW Contact:    Emeterio Reeve, [place] Phone Number: 06/19/2020, 4:32 PM  Clinical Narrative:                  CSW met with pt at bedside. CSW introduced self and explained her role at the hospital. Pt states PTA she lived at home with her husband. Pt states she is able to walk an complete all ADL's . Pt states she does minimal walking and uses a cane but would like a walker. Pt states her husband helps her when she needs it.   CSW reviewed pt reccs with pt. Pt stated she is fine with PT coming in. Pt also states she has a nurse that comes out to her house that helps with managing her weight and medications. PT did not remember the name of that agency. PT has also recommends a rollator for home.   CSW will continue to follow.   Expected Discharge Plan: Skilled Nursing Facility Barriers to Discharge: Continued Medical Work up   Patient Goals and CMS Choice Patient states their goals for this hospitalization and ongoing recovery are:: To return home with [place] CMS Medicare.gov Compare Post Acute Care list provided to:: Patient Represenative (must comment) Choice offered to / list presented to : Patient  Expected Discharge Plan and Services Expected Discharge Plan: [place]       Living arrangements for the past 2 months: Single Family Home                                      Prior Living Arrangements/Services Living arrangements for the past 2 months: Single Family Home Lives with:: Spouse Patient language and need for interpreter reviewed:: Yes Do you feel safe going back to the place where you live?: Yes      Need for Family Participation in Patient Care: Yes (Comment) Care giver support system in place?: Yes (comment) Current home services: DME Criminal Activity/Legal  Involvement Pertinent to Current Situation/Hospitalization: No - Comment as needed  Activities of Daily Living Home Assistive Devices/Equipment: Cane (specify quad or straight), Dentures (specify type), Raised toilet seat with rails ADL Screening (condition at time of admission) Patient's cognitive ability adequate to safely complete daily activities?: Yes Is the patient deaf or have difficulty hearing?: No Does the patient have difficulty seeing, even when wearing glasses/contacts?: Yes Does the patient have difficulty concentrating, remembering, or making decisions?: No Patient able to express need for assistance with ADLs?: Yes Does the patient have difficulty dressing or bathing?: Yes Independently performs ADLs?: Yes (appropriate for developmental age) Does the patient have difficulty walking or climbing stairs?: Yes Weakness of Legs: Both Weakness of Arms/Hands: None  Permission Sought/Granted Permission sought to share information with : Family Supports Permission granted to share information with : Yes, Verbal Permission Granted  Share Information with NAME: Avarey Yaeger  Permission granted to share info w AGENCY: HH agencies        Emotional Assessment Appearance:: Appears stated age Attitude/Demeanor/Rapport: Engaged Affect (typically observed): Appropriate Orientation: : Oriented to Situation, Oriented to  Time, Oriented to Place, Oriented to Self Alcohol / Substance Use: Not Applicable Psych Involvement: No (comment)  Admission diagnosis:  Shortness of breath [R06.02] Acute respiratory failure with hypoxia (  [place]) [J96.01] Acute on chronic congestive heart failure, unspecified heart failure type ([place]) [I50.9] Acute on chronic diastolic CHF (congestive heart failure) ([place]) [I50.33] Patient Active Problem List   Diagnosis Date Noted   Morbid obesity ([place]) 06/19/2020   Neck mass 06/18/2020   Acute respiratory failure with hypoxia ([place]) 06/16/2020   Acute  hypoxemic respiratory failure due to COVID-19 [facility]) 12/30/2019   Diabetes mellitus without complication (HCC)    Acute on chronic diastolic CHF (congestive heart failure) (HCC)    AF (paroxysmal atrial fibrillation) ([place])    CKD (chronic kidney disease), stage III    Hypertension associated with diabetes ([place])    PCP:  [name], MD Pharmacy:   [facility] [place], [address] AT [place] [address] [place] [address] Phone: [phone] Fax: [phone]     Social Determinants of Health (SDOH) Interventions    Readmission Risk Interventions No flowsheet data found.  Emeterio Reeve, Latanya Presser, [place] Social Worker [phone]

## 2020-06-20 LAB — GLUCOSE, CAPILLARY
Glucose-Capillary: 150 mg/dL — ABNORMAL HIGH (ref 70–99)
Glucose-Capillary: 143 mg/dL — ABNORMAL HIGH (ref 70–99)

## 2020-06-20 NOTE — Progress Notes (Signed)
Subjective: Interviewed patient at bedside. She reports no problems. She reports her breathing is improved. She has no further questions and is excited to be discharged.  Objective:  Vital signs in last 24 hours: Vitals:   06/19/20 0810 06/19/20 1000 06/19/20 1631 06/19/20 1933  BP: (!) 111/55  (!) 106/93 (!) 122/56  Pulse: (!) 58  (!) 54 78  Resp: 16  16 18   Temp: 98.2 F (36.8 C)  (!) 97.5 F (36.4 C) 98 F (36.7 C)  TempSrc: Oral  Oral Oral  SpO2: 97%  100% 98%  Weight:  117.3 kg    Height:       Physical Exam Vitals and nursing note reviewed.  Constitutional:      General: She is not in acute distress.    Appearance: She is not ill-appearing or toxic-appearing.  HENT:     Head: Normocephalic and atraumatic.  Eyes:     Extraocular Movements: Extraocular movements intact.  Cardiovascular:     Rate and Rhythm: Normal rate and regular rhythm.     Pulses:          Radial pulses are 2+ on the right side and 2+ on the left side.       Dorsalis pedis pulses are 2+ on the right side and 2+ on the left side.     Heart sounds: Normal heart sounds, S1 normal and S2 normal. No murmur heard.   Pulmonary:     Effort: Pulmonary effort is normal. No respiratory distress.     Breath sounds: Normal breath sounds. No stridor. No wheezing.  Musculoskeletal:     Right lower leg: No edema.     Left lower leg: No edema.  Neurological:     General: No focal deficit present.     Mental Status: She is alert. Mental status is at baseline.  Psychiatric:        Mood and Affect: Mood normal.        Behavior: Behavior normal.        Thought Content: Thought content normal.      Assessment/Plan: Mrs. 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 Noted with have findings concerning for a HFpEF  exacerbation.  Principal Problem:   Acute on chronic diastolic CHF (congestive heart failure) (HCC) Active Problems:   Diabetes mellitus without complication (HCC)   AF (paroxysmal atrial fibrillation) (HCC)   CKD (chronic kidney disease), stage III   Hypertension associated with diabetes ([place])   Acute respiratory failure with hypoxia (HCC)   Neck mass   Morbid obesity (HCC)  HFpEF exacerbation: Pulmonary HTN: Likely 2/2 increased fluid intake and dietary indiscretion.Patient received 80 mg IV lasix in AM and 80 mg IV lasix in PM yesterday.Net output was 1600cc. Remains on 2L [place] which is her baseline at home. VSS. On exam edema had improved and her breathing is improved from yesterday.Will discharge today.     Ventricular tachycardia:  4 beat run noted on telemetry on 7/11. Patient asymptomatic at that time. Mag and K normal. Will continue to monitor electrolytes.  -BMP showed electrolytes within normal limits -Will continue to monitor    Persistent Atrial Fibrillation Rate controlled on Coreg. Stable, not on anticoagulation due to history of GI bleeds.  -Continue coreg 12.5mg  BID   Anterior neck nodule: Concerning for  goiter. Patient reports that this has been there for awhile, no pain on palpation. No imaging noted in chart.   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 She has had extensive work-up for her chronic abdominal pain and did have positive Direct coombs on 04/22/20 and 04/12/20 in addition to positive cold agglutinins on 5/05, which was normal on 5/15. She also has had capsule endoscopy, unable to see records. CT enterography 04/04/20 with only small cystic pancreatic lesion. Colonoscopy 3/21 normal. Bilirubin has improved today at 1.6. Denies any abdominal pain today. Is following with outpatient GI and has follow up with them.  -  hemoglobin stable, follow-up with GI for planned small bowel enteroscopy  - indirect bili ordered - per GI note 04/25/20 hemolysis work-up negative.  - continue protonix 40 mg daily   Type II DM Last a1c 6.1 in May. -Continue monitoring glucose  -Continue sliding scale    Prior to Admission Living Arrangement:home Anticipated Discharge Location: home Barriers to Discharge: none Dispo: Anticipated discharge today  Briant Cedar, MD 06/20/2020, 5:53 AM Pager: [phone] After 5pm on weekdays and 1pm on weekends: On Call pager [phone] \

## 2020-06-20 NOTE — Progress Notes (Signed)
PT Cancellation Note  Patient Details Name: Lyncoln Maskell MRN: [id number] DOB: 06-27-1956   Cancelled Treatment:    Reason Eval/Treat Not Completed: Other (comment) attempted to see patient, she was eating lunch and politely declines, asks if PT can come back. Will attempt to return prior to DC if time/schedule allow.    Windell Norfolk, DPT, PN1   Supplemental Physical Therapist [facility]    Pager [phone] Acute Rehab Office [phone]

## 2020-06-20 NOTE — TOC Transition Note (Signed)
Transition of Care [facility]) - CM/SW Discharge Note   Patient Details  Name: Octavie Westerhold MRN: [id number] Date of Birth: 10-Feb-1956  Transition [facility]) CM/SW Contact:  Emeterio Reeve, [place] Phone Number: 06/20/2020, 3:17 PM   Clinical Narrative:     Pt will be receiving HHPT via [facility]. [place] from [place] will reach out to pt to set up times.    Final next level of care: [place] Barriers to Discharge: Barriers Resolved   Patient Goals and CMS Choice Patient states their goals for this hospitalization and ongoing recovery are:: to return home with husband CMS Medicare.gov Compare Post Acute Care list provided to:: Patient Choice offered to / list presented to : Patient, Spouse  Discharge Placement                Patient to be transferred to facility by: husband via car Name of family member notified: husband Patient and family notified of of transfer: 06/20/20  Discharge Plan and Services                DME Arranged: [place] Rile DME Agency: [employer] Date DME Agency Contacted: 06/19/20 Time DME Agency Contacted: [phone] Representative spoke with at DME Agency: [place]: [facility] [place] Date [place]: 06/20/20 Time [place]: 1500 Representative spoke with at [place]: [place] ([place]) Interventions     Readmission Risk Interventions No flowsheet data found.  Emeterio Reeve, Latanya Presser, [place] Social Worker [phone]

## 2020-06-26 ENCOUNTER — Encounter (HOSPITAL_COMMUNITY): Payer: Self-pay

## 2020-06-26 ENCOUNTER — Inpatient Hospital Stay (HOSPITAL_COMMUNITY)
Admission: EM | Admit: 2020-06-26 | Discharge: 2020-06-29 | DRG: 291 | Disposition: A | Discharge status: Home / Self Care | Payer: PPO | Attending: Internal Medicine | Admitting: Internal Medicine

## 2020-06-26 ENCOUNTER — Emergency Department (HOSPITAL_COMMUNITY): Payer: PPO

## 2020-06-26 DIAGNOSIS — E041 Nontoxic single thyroid nodule: Secondary | ICD-10-CM | POA: Diagnosis present

## 2020-06-26 DIAGNOSIS — Z888 Allergy status to other drugs, medicaments and biological substances status: Secondary | ICD-10-CM

## 2020-06-26 DIAGNOSIS — Z20822 Contact with and (suspected) exposure to covid-19: Secondary | ICD-10-CM | POA: Diagnosis present

## 2020-06-26 DIAGNOSIS — N1832 Chronic kidney disease, stage 3b: Secondary | ICD-10-CM | POA: Diagnosis present

## 2020-06-26 DIAGNOSIS — R8271 Bacteriuria: Secondary | ICD-10-CM | POA: Diagnosis present

## 2020-06-26 DIAGNOSIS — J9819 Other pulmonary collapse: Secondary | ICD-10-CM | POA: Diagnosis present

## 2020-06-26 DIAGNOSIS — G8929 Other chronic pain: Secondary | ICD-10-CM | POA: Diagnosis present

## 2020-06-26 DIAGNOSIS — R809 Proteinuria, unspecified: Secondary | ICD-10-CM | POA: Diagnosis present

## 2020-06-26 DIAGNOSIS — I493 Ventricular premature depolarization: Secondary | ICD-10-CM | POA: Diagnosis present

## 2020-06-26 DIAGNOSIS — R7402 Elevation of levels of lactic acid dehydrogenase (LDH): Secondary | ICD-10-CM | POA: Diagnosis present

## 2020-06-26 DIAGNOSIS — N2889 Other specified disorders of kidney and ureter: Secondary | ICD-10-CM | POA: Diagnosis not present

## 2020-06-26 DIAGNOSIS — J9621 Acute and chronic respiratory failure with hypoxia: Secondary | ICD-10-CM | POA: Diagnosis present

## 2020-06-26 DIAGNOSIS — Z809 Family history of malignant neoplasm, unspecified: Secondary | ICD-10-CM | POA: Diagnosis not present

## 2020-06-26 DIAGNOSIS — Z79899 Other long term (current) drug therapy: Secondary | ICD-10-CM | POA: Diagnosis not present

## 2020-06-26 DIAGNOSIS — R17 Unspecified jaundice: Secondary | ICD-10-CM | POA: Diagnosis present

## 2020-06-26 DIAGNOSIS — I130 Hypertensive heart and chronic kidney disease with heart failure and stage 1 through stage 4 chronic kidney disease, or unspecified chronic kidney disease: Principal | ICD-10-CM | POA: Diagnosis present

## 2020-06-26 DIAGNOSIS — I5033 Acute on chronic diastolic (congestive) heart failure: Secondary | ICD-10-CM | POA: Diagnosis present

## 2020-06-26 DIAGNOSIS — K921 Melena: Secondary | ICD-10-CM | POA: Diagnosis not present

## 2020-06-26 DIAGNOSIS — Z7982 Long term (current) use of aspirin: Secondary | ICD-10-CM

## 2020-06-26 DIAGNOSIS — N183 Chronic kidney disease, stage 3 unspecified: Secondary | ICD-10-CM | POA: Diagnosis not present

## 2020-06-26 DIAGNOSIS — I4819 Other persistent atrial fibrillation: Secondary | ICD-10-CM | POA: Diagnosis present

## 2020-06-26 DIAGNOSIS — J90 Pleural effusion, not elsewhere classified: Secondary | ICD-10-CM | POA: Diagnosis not present

## 2020-06-26 DIAGNOSIS — E1122 Type 2 diabetes mellitus with diabetic chronic kidney disease: Secondary | ICD-10-CM | POA: Diagnosis present

## 2020-06-26 DIAGNOSIS — D500 Iron deficiency anemia secondary to blood loss (chronic): Secondary | ICD-10-CM | POA: Diagnosis not present

## 2020-06-26 DIAGNOSIS — Z9889 Other specified postprocedural states: Secondary | ICD-10-CM

## 2020-06-26 DIAGNOSIS — Z885 Allergy status to narcotic agent status: Secondary | ICD-10-CM

## 2020-06-26 DIAGNOSIS — D72829 Elevated white blood cell count, unspecified: Secondary | ICD-10-CM | POA: Diagnosis present

## 2020-06-26 DIAGNOSIS — I5032 Chronic diastolic (congestive) heart failure: Secondary | ICD-10-CM | POA: Diagnosis not present

## 2020-06-26 DIAGNOSIS — J948 Other specified pleural conditions: Secondary | ICD-10-CM | POA: Diagnosis not present

## 2020-06-26 DIAGNOSIS — R0602 Shortness of breath: Secondary | ICD-10-CM | POA: Diagnosis present

## 2020-06-26 DIAGNOSIS — J918 Pleural effusion in other conditions classified elsewhere: Secondary | ICD-10-CM | POA: Diagnosis present

## 2020-06-26 DIAGNOSIS — K7689 Other specified diseases of liver: Secondary | ICD-10-CM | POA: Diagnosis not present

## 2020-06-26 DIAGNOSIS — J9611 Chronic respiratory failure with hypoxia: Secondary | ICD-10-CM | POA: Diagnosis not present

## 2020-06-26 DIAGNOSIS — J9811 Atelectasis: Secondary | ICD-10-CM | POA: Diagnosis not present

## 2020-06-26 DIAGNOSIS — Z7984 Long term (current) use of oral hypoglycemic drugs: Secondary | ICD-10-CM | POA: Diagnosis not present

## 2020-06-26 DIAGNOSIS — E079 Disorder of thyroid, unspecified: Secondary | ICD-10-CM | POA: Diagnosis not present

## 2020-06-26 DIAGNOSIS — I272 Pulmonary hypertension, unspecified: Secondary | ICD-10-CM | POA: Diagnosis present

## 2020-06-26 DIAGNOSIS — I509 Heart failure, unspecified: Secondary | ICD-10-CM

## 2020-06-26 DIAGNOSIS — J9601 Acute respiratory failure with hypoxia: Secondary | ICD-10-CM

## 2020-06-26 DIAGNOSIS — I110 Hypertensive heart disease with heart failure: Secondary | ICD-10-CM | POA: Diagnosis not present

## 2020-06-26 DIAGNOSIS — R0902 Hypoxemia: Secondary | ICD-10-CM | POA: Diagnosis not present

## 2020-06-26 DIAGNOSIS — R221 Localized swelling, mass and lump, neck: Secondary | ICD-10-CM | POA: Diagnosis not present

## 2020-06-26 DIAGNOSIS — E049 Nontoxic goiter, unspecified: Secondary | ICD-10-CM

## 2020-06-26 DIAGNOSIS — I517 Cardiomegaly: Secondary | ICD-10-CM | POA: Diagnosis not present

## 2020-06-26 DIAGNOSIS — B961 Klebsiella pneumoniae [K. pneumoniae] as the cause of diseases classified elsewhere: Secondary | ICD-10-CM | POA: Diagnosis present

## 2020-06-26 LAB — COMPREHENSIVE METABOLIC PANEL
ALT: 15 U/L (ref 0–44)
AST: 26 U/L (ref 15–41)
Albumin: 3.8 g/dL (ref 3.5–5.0)
Alkaline Phosphatase: 77 U/L (ref 38–126)
Anion gap: 14 (ref 5–15)
BUN: 20 mg/dL (ref 8–23)
CO2: 30 mmol/L (ref 22–32)
Calcium: 9.4 mg/dL (ref 8.9–10.3)
Chloride: 99 mmol/L (ref 98–111)
Creatinine, Ser: 1.37 mg/dL — ABNORMAL HIGH (ref 0.44–1.00)
GFR calc Af Amer: 46 mL/min — ABNORMAL LOW (ref >60)
GFR calc non Af Amer: 40 mL/min — ABNORMAL LOW (ref >60)
Glucose, Bld: 154 mg/dL — ABNORMAL HIGH (ref 70–99)
Potassium: 4 mmol/L (ref 3.5–5.1)
Sodium: 143 mmol/L (ref 135–145)
Total Bilirubin: 2.6 mg/dL — ABNORMAL HIGH (ref 0.3–1.2)
Total Protein: 8.3 g/dL — ABNORMAL HIGH (ref 6.5–8.1)

## 2020-06-26 LAB — CBC
HCT: 30.7 % — ABNORMAL LOW (ref 36.0–46.0)
Hemoglobin: 9.4 g/dL — ABNORMAL LOW (ref 12.0–15.0)
MCH: 26.3 pg (ref 26.0–34.0)
MCHC: 30.6 g/dL (ref 30.0–36.0)
MCV: 86 fL (ref 80.0–100.0)
Platelets: 124 10*3/uL — ABNORMAL LOW (ref 150–400)
RBC: 3.57 MIL/uL — ABNORMAL LOW (ref 3.87–5.11)
RDW: 16.4 % — ABNORMAL HIGH (ref 11.5–15.5)
WBC: 6.2 10*3/uL (ref 4.0–10.5)
nRBC: 0 % (ref 0.0–0.2)

## 2020-06-26 MED ORDER — FUROSEMIDE 10 MG/ML IJ SOLN
40.0000 mg | Freq: Once | INTRAMUSCULAR | Status: AC
  Administered 2020-06-27: 40 mg via INTRAVENOUS
  Filled 2020-06-26: qty 4

## 2020-06-26 NOTE — ED Provider Notes (Signed)
[facility] EMERGENCY DEPARTMENT Provider Note   CSN: [id number] Arrival date & time: 06/26/20  1321     History Chief Complaint  Patient presents with   Shortness of Breath    [place] is a 64 y.o. female with a history of chronic respiratory failure with hypoxia on home O2, chronic diastolic CHF, diabetes mellitus type 2, GI bleed, HTN, pulmonary hypertension, CKD stage IIIb, and neck mass who presents to the emergency department from her PCPs office with a chief complaint of hypoxia.  She was recently hospitalized from 7/8-7/13 for CHF exacerbation. Per chart review, patient was seen by Dr. Bea Graff with internal medicine earlier today for discharge follow up.  She was noted to have oxygen saturation 80-82% despite being on her 2 L nasal cannula and was advised to immediately go to the ER.  The patient reports that she has been advised to wear 2 L nasal cannula at night and throughout the day as needed.  She does report that since her last admission that she has been wearing 2 L nasal cannula almost entirely around-the-clock.  She has had to intermittently increase it to 3 L with exertion.  She is currently on 2 L nasal cannula in the room.  She reports that she has had an intermittent productive cough over the last few days.  Her husband also notes that her legs have been more swollen.  The patient feels as if her abdomen has also been more swollen.  She also reports that she is feeling constipated as she has not had a bowel movement in several days.  She has been feeling more gassy and has had some intermittent pain to her left flank.  Pain is nonradiating.  No known aggravating or alleviating factors.  She denies chest pain, chest tightness, back pain, orthopnea, PND, palpitations, dizziness, lightheadedness.  No vomiting or diarrhea.  No fever or chills.  She reports that she has been taking her home Lasix as prescribed.  She has intermittently had to  take 2 doses since being discharged.  However, she has not taken a dose today since she was at her PCPs office then came to the ER.  She has been weighing herself at home.  She reports the day after she was discharged that she weighed 257.7 lbs at home and yesterday weighed 257.6 pounds.  She reports that she has a history of both PE and DVT.  She does not take any blood thinners as she has a history of GI bleed.   The history is provided by the patient and medical records. No language interpreter was used.       Past Medical History:  Diagnosis Date   Atrial fibrillation (HCC)    Chronic respiratory failure with hypoxia, on home O2 therapy (HCC)    Diabetes mellitus without complication (HCC)    Diastolic CHF ([place])    Hypertension     Patient Active Problem List   Diagnosis Date Noted   Acute on chronic respiratory failure ([place]) 06/27/2020   Morbid obesity ([place]) 06/19/2020   Neck mass 06/18/2020   Acute respiratory failure with hypoxia ([place]) 06/16/2020   Acute hypoxemic respiratory failure due to COVID-19 ([place]) 12/30/2019   Diabetes mellitus without complication (HCC)    Acute on chronic diastolic CHF (congestive heart failure) (HCC)    AF (paroxysmal atrial fibrillation) (HCC)    CKD (chronic kidney disease), stage III    Hypertension associated with diabetes ([place])  History reviewed. No pertinent surgical history.   OB History   No obstetric history on file.     Family History  Problem Relation Age of Onset   Cancer Mother     Social History   Tobacco Use   Smoking status: Never Smoker   Smokeless tobacco: Never Used  Substance Use Topics   Alcohol use: Never   Drug use: Never    Home Medications Prior to Admission medications   Medication Sig Start Date End Date Taking? Authorizing Provider  allopurinol (ZYLOPRIM) 100 MG tablet Take 100 mg by mouth 2 (two) times daily.   Yes [provider]  aspirin EC 81 MG tablet Take 81  mg by mouth daily. Swallow whole.   Yes [provider]  carvedilol (COREG) 25 MG tablet Take 12.5 mg by mouth 2 (two) times daily with a meal.    Yes [provider]  ferrous sulfate 325 (65 FE) MG tablet Take 325 mg by mouth 2 (two) times daily with a meal.    Yes [provider]  furosemide (LASIX) 40 MG tablet Take 40 mg by mouth See admin instructions. Take one tablet (40 mg) by mouth every morning, may take a 2nd tablet (40 mg) in the evening (4-5pm) as needed for fluid/swelling   Yes [provider]  glipiZIDE (GLUCOTROL XL) 5 MG 24 hr tablet Take 5 mg by mouth daily with breakfast.  06/08/20  Yes [provider]  metFORMIN (GLUCOPHAGE-XR) 500 MG 24 hr tablet Take 500 mg by mouth daily with breakfast.   Yes [provider]  OXYGEN Inhale 2 L into the lungs continuous.   Yes [provider]  pantoprazole (PROTONIX) 40 MG tablet Take 40 mg by mouth daily.   Yes [provider]  potassium chloride SA (KLOR-CON) 20 MEQ tablet Take 20 mEq by mouth daily.   Yes [provider]  blood glucose meter kit and supplies Dispense based on patient and insurance preference. Use up to four times daily as directed. (FOR ICD-10 E10.9, E11.9). 01/02/20   Ghimire, Henreitta Leber, MD    Allergies    Codeine, Prednisone, and Moxifloxacin  Review of Systems   Review of Systems  Constitutional: Negative for activity change, chills and fever.  HENT: Negative for congestion.   Respiratory: Positive for cough and shortness of breath.   Cardiovascular: Positive for leg swelling. Negative for chest pain and palpitations.  Gastrointestinal: Positive for abdominal distention and abdominal pain. Negative for diarrhea, nausea and vomiting.  Genitourinary: Negative for dysuria.  Musculoskeletal: Negative for back pain.  Skin: Negative for rash.  Allergic/Immunologic: Negative for immunocompromised state.  Neurological: Negative for headaches.    Psychiatric/Behavioral: Negative for confusion.   Physical Exam Updated Vital Signs BP 135/78    Pulse 71    Temp 97.8 F (36.6 C) (Oral)    Resp 20    Ht _0  (1.549 m)    Wt 118 kg    SpO2 99%    BMI 49.15 kg/m   Physical Exam Vitals and nursing note reviewed.  Constitutional:      General: She is not in acute distress.    Appearance: She is not ill-appearing or toxic-appearing.     Comments: Chronically ill-appearing  HENT:     Head: Normocephalic.  Eyes:     Conjunctiva/sclera: Conjunctivae normal.  Cardiovascular:     Rate and Rhythm: Normal rate and regular rhythm.     Heart sounds: No murmur heard.  No  friction rub. No gallop.   Pulmonary:     Effort: Pulmonary effort is normal. No respiratory distress.     Comments: Nasal cannula in place on 2 L.  No increased work of breathing.  Crackles in the bilateral bases.   Abdominal:     General: There is distension.     Palpations: Abdomen is soft. There is no mass.     Tenderness: There is abdominal tenderness. There is no right CVA tenderness, left CVA tenderness, guarding or rebound.     Hernia: No hernia is present.     Comments: Mild tenderness palpation to the left lower abdomen without rebound or guarding.  Abdomen is mildly distended, but soft.  Musculoskeletal:     Cervical back: Neck supple.     Right lower leg: Edema present.     Left lower leg: Edema present.     Comments: 2+ pitting edema noted to the bilateral lower extremities.  Skin:    General: Skin is warm.     Findings: No rash.  Neurological:     Mental Status: She is alert.  Psychiatric:        Behavior: Behavior normal.     ED Results / Procedures / Treatments   Labs (all labs ordered are listed, but only abnormal results are displayed) Labs Reviewed  CBC - Abnormal; Notable for the following components:      Result Value   RBC 3.57 (*)    Hemoglobin 9.4 (*)    HCT 30.7 (*)    RDW 16.4 (*)    Platelets 124 (*)    All other components  within normal limits  COMPREHENSIVE METABOLIC PANEL - Abnormal; Notable for the following components:   Glucose, Bld 154 (*)    Creatinine, Ser 1.37 (*)    Total Protein 8.3 (*)    Total Bilirubin 2.6 (*)    GFR calc non Af Amer 40 (*)    GFR calc Af Amer 46 (*)    All other components within normal limits  BRAIN NATRIURETIC PEPTIDE - Abnormal; Notable for the following components:   B Natriuretic Peptide 723.7 (*)    All other components within normal limits  URINALYSIS, ROUTINE W REFLEX MICROSCOPIC - Abnormal; Notable for the following components:   APPearance HAZY (*)    Hgb urine dipstick LARGE (*)    Protein, ur 30 (*)    Leukocytes,Ua LARGE (*)    WBC, UA >50 (*)    Bacteria, UA RARE (*)    All other components within normal limits  BASIC METABOLIC PANEL - Abnormal; Notable for the following components:   Potassium 3.0 (*)    Glucose, Bld 171 (*)    Creatinine, Ser 1.20 (*)    Calcium 8.6 (*)    GFR calc non Af Amer 47 (*)    GFR calc Af Amer 55 (*)    All other components within normal limits  HEPATIC FUNCTION PANEL - Abnormal; Notable for the following components:   Total Bilirubin 2.4 (*)    Bilirubin, Direct 0.6 (*)    Indirect Bilirubin 1.8 (*)    All other components within normal limits  RETICULOCYTES - Abnormal; Notable for the following components:   RBC. 2.96 (*)    All other components within normal limits  LACTATE DEHYDROGENASE - Abnormal; Notable for the following components:   LDH 249 (*)    All other components within normal limits  CBC WITH DIFFERENTIAL/PLATELET - Abnormal; Notable for the following components:  RBC 3.00 (*)    Hemoglobin 8.0 (*)    HCT 26.1 (*)    RDW 16.5 (*)    Platelets 111 (*)    All other components within normal limits  CBG MONITORING, ED - Abnormal; Notable for the following components:   Glucose-Capillary 148 (*)    All other components within normal limits  TROPONIN I (HIGH SENSITIVITY) - Abnormal; Notable for the  following components:   Troponin I (High Sensitivity) 276 (*)    All other components within normal limits  TROPONIN I (HIGH SENSITIVITY) - Abnormal; Notable for the following components:   Troponin I (High Sensitivity) 295 (*)    All other components within normal limits  SARS CORONAVIRUS 2 BY RT PCR (HOSPITAL ORDER, [facility])  URINE CULTURE  PROCALCITONIN  STREP PNEUMONIAE URINARY ANTIGEN  LEGIONELLA PNEUMOPHILA SEROGP 1 UR AG  CBC WITH DIFFERENTIAL/PLATELET  HAPTOGLOBIN    EKG EKG Interpretation  Date/Time:  Monday June 26 2020 13:44:05 EDT Ventricular Rate:  82 PR Interval:    QRS Duration: 104 QT Interval:  366 QTC Calculation: 427 R Axis:   137 Text Interpretation: Atrial fibrillation with premature ventricular or aberrantly conducted complexes Right axis deviation Low voltage QRS Nonspecific ST and T wave abnormality Confirmed by Randal Buba, April ([id number]) on 06/27/2020 12:00:16 AM   Radiology DG Chest 2 View  Result Date: 06/26/2020 CLINICAL DATA:  Shortness of breath EXAM: CHEST - 2 VIEW COMPARISON:  06/15/2020 FINDINGS: Stable cardiomegaly. Atherosclerotic calcification of the aortic knob. Small bilateral pleural effusions with persistent bibasilar airspace consolidations, right slightly greater than left. No pneumothorax. Degenerative changes of the bilateral shoulders. IMPRESSION: Small bilateral pleural effusions with persistent bibasilar airspace consolidations, right slightly greater than left. Overall, little interval change from prior. Electronically Signed   By: Davina Poke D.O.   On: 06/26/2020 14:21   CT Angio Chest PE W and/or Wo Contrast  Result Date: 06/27/2020 CLINICAL DATA:  Hypoxemia.  Generalized GI motility disorder. EXAM: CT ANGIOGRAPHY CHEST CT ABDOMEN AND PELVIS WITH CONTRAST TECHNIQUE: Multidetector CT imaging of the chest was performed using the standard protocol during bolus administration of intravenous contrast.  Multiplanar CT image reconstructions and MIPs were obtained to evaluate the vascular anatomy. Multidetector CT imaging of the abdomen and pelvis was performed using the standard protocol during bolus administration of intravenous contrast. CONTRAST:  27m OMNIPAQUE IOHEXOL 350 MG/ML SOLN COMPARISON:  May 29, 2020 FINDINGS: CTA CHEST FINDINGS Cardiovascular: Contrast injection is sufficient to demonstrate satisfactory opacification of the pulmonary arteries to the segmental level. There is no pulmonary embolus or evidence of right heart strain. The size of the main pulmonary artery is enlarged, measuring 3.8 cm. The heart is significantly enlarged. There are coronary artery calcifications. There are atherosclerotic changes of the thoracic aorta. Mediastinum/Nodes: -- No mediastinal lymphadenopathy. -- No hilar lymphadenopathy. -- No axillary lymphadenopathy. -- No supraclavicular lymphadenopathy. -- Normal thyroid gland where visualized. -  Unremarkable esophagus. Lungs/Pleura: There is a large right-sided pleural effusion. There is near complete collapse of the right lower lobe. There is a small left-sided pleural effusion. There is an opacity in the right middle lobe concerning for a developing infiltrate. There is a mosaic appearance of the lung parenchyma. The lung volumes are low. Musculoskeletal: No chest wall abnormality. No bony spinal canal stenosis. There is diffuse skin thickening of the left breast which is asymmetric. Review of the MIP images confirms the above findings. CT ABDOMEN and PELVIS FINDINGS Hepatobiliary: There is  decreased hepatic attenuation suggestive of hepatic steatosis. Normal gallbladder.There is no biliary ductal dilation. Pancreas: There is a small cystic appearing lesion in the pancreatic body which is stable from remote prior studies and is consistent with a benign process. Spleen: Unremarkable. Adrenals/Urinary Tract: --Adrenal glands: Unremarkable. --Right kidney/ureter: No  hydronephrosis or radiopaque kidney stones. --Left kidney/ureter: There is no left-sided hydronephrosis. There is new fat stranding about the left ureter with a small amount of new retroperitoneal free fluid. --Urinary bladder: There is mild bladder wall thickening. Stomach/Bowel: --Stomach/Duodenum: No hiatal hernia or other gastric abnormality. Normal duodenal course and caliber. --Small bowel: Unremarkable. --Colon: Unremarkable. --Appendix: Normal. Vascular/Lymphatic: There is an IVC filter in place. The suprarenal IVC is severely dilated, similar to prior studies. Mild atherosclerotic changes are noted of the abdominal aorta. --No retroperitoneal lymphadenopathy. --No mesenteric lymphadenopathy. --there is mild bilateral inguinal adenopathy, stable from prior study. Reproductive: Unremarkable Other: No ascites or free air. The abdominal wall is normal. Musculoskeletal. No acute displaced fractures. Review of the MIP images confirms the above findings. IMPRESSION: 1. No acute pulmonary embolism. 2. Large right-sided pleural effusion with near complete collapse of the right lower lobe. 3. Small left-sided pleural effusion. 4. Airspace opacity in the right middle lobe concerning for a developing infiltrate. 5. Mosaic appearance of the lung parenchyma, which can be seen in patients with small airways disease or chronic pulmonary hypertension. The main pulmonary artery is dilated suggestive of elevated pulmonary artery pressures. 6. Cardiomegaly. 7. New fat stranding about the left ureter with a small amount of new retroperitoneal free fluid. Findings are concerning for an ascending urinary tract infection and should be correlated with urinalysis. 8. Hepatic steatosis. Aortic Atherosclerosis (ICD10-I70.0). Electronically Signed   By: Constance Holster M.D.   On: 06/27/2020 02:46   CT ABDOMEN PELVIS W CONTRAST  Result Date: 06/27/2020 CLINICAL DATA:  Hypoxemia.  Generalized GI motility disorder. EXAM: CT  ANGIOGRAPHY CHEST CT ABDOMEN AND PELVIS WITH CONTRAST TECHNIQUE: Multidetector CT imaging of the chest was performed using the standard protocol during bolus administration of intravenous contrast. Multiplanar CT image reconstructions and MIPs were obtained to evaluate the vascular anatomy. Multidetector CT imaging of the abdomen and pelvis was performed using the standard protocol during bolus administration of intravenous contrast. CONTRAST:  47m OMNIPAQUE IOHEXOL 350 MG/ML SOLN COMPARISON:  May 29, 2020 FINDINGS: CTA CHEST FINDINGS Cardiovascular: Contrast injection is sufficient to demonstrate satisfactory opacification of the pulmonary arteries to the segmental level. There is no pulmonary embolus or evidence of right heart strain. The size of the main pulmonary artery is enlarged, measuring 3.8 cm. The heart is significantly enlarged. There are coronary artery calcifications. There are atherosclerotic changes of the thoracic aorta. Mediastinum/Nodes: -- No mediastinal lymphadenopathy. -- No hilar lymphadenopathy. -- No axillary lymphadenopathy. -- No supraclavicular lymphadenopathy. -- Normal thyroid gland where visualized. -  Unremarkable esophagus. Lungs/Pleura: There is a large right-sided pleural effusion. There is near complete collapse of the right lower lobe. There is a small left-sided pleural effusion. There is an opacity in the right middle lobe concerning for a developing infiltrate. There is a mosaic appearance of the lung parenchyma. The lung volumes are low. Musculoskeletal: No chest wall abnormality. No bony spinal canal stenosis. There is diffuse skin thickening of the left breast which is asymmetric. Review of the MIP images confirms the above findings. CT ABDOMEN and PELVIS FINDINGS Hepatobiliary: There is decreased hepatic attenuation suggestive of hepatic steatosis. Normal gallbladder.There is no biliary ductal dilation. Pancreas: There is a  small cystic appearing lesion in the pancreatic  body which is stable from remote prior studies and is consistent with a benign process. Spleen: Unremarkable. Adrenals/Urinary Tract: --Adrenal glands: Unremarkable. --Right kidney/ureter: No hydronephrosis or radiopaque kidney stones. --Left kidney/ureter: There is no left-sided hydronephrosis. There is new fat stranding about the left ureter with a small amount of new retroperitoneal free fluid. --Urinary bladder: There is mild bladder wall thickening. Stomach/Bowel: --Stomach/Duodenum: No hiatal hernia or other gastric abnormality. Normal duodenal course and caliber. --Small bowel: Unremarkable. --Colon: Unremarkable. --Appendix: Normal. Vascular/Lymphatic: There is an IVC filter in place. The suprarenal IVC is severely dilated, similar to prior studies. Mild atherosclerotic changes are noted of the abdominal aorta. --No retroperitoneal lymphadenopathy. --No mesenteric lymphadenopathy. --there is mild bilateral inguinal adenopathy, stable from prior study. Reproductive: Unremarkable Other: No ascites or free air. The abdominal wall is normal. Musculoskeletal. No acute displaced fractures. Review of the MIP images confirms the above findings. IMPRESSION: 1. No acute pulmonary embolism. 2. Large right-sided pleural effusion with near complete collapse of the right lower lobe. 3. Small left-sided pleural effusion. 4. Airspace opacity in the right middle lobe concerning for a developing infiltrate. 5. Mosaic appearance of the lung parenchyma, which can be seen in patients with small airways disease or chronic pulmonary hypertension. The main pulmonary artery is dilated suggestive of elevated pulmonary artery pressures. 6. Cardiomegaly. 7. New fat stranding about the left ureter with a small amount of new retroperitoneal free fluid. Findings are concerning for an ascending urinary tract infection and should be correlated with urinalysis. 8. Hepatic steatosis. Aortic Atherosclerosis (ICD10-I70.0). Electronically Signed    By: Constance Holster M.D.   On: 06/27/2020 02:46    Procedures Procedures (including critical care time)  Medications Ordered in ED Medications  cefTRIAXone (ROCEPHIN) 2 g in sodium chloride 0.9 % 100 mL IVPB (0 g Intravenous Stopped 06/27/20 0510)  azithromycin (ZITHROMAX) tablet 500 mg (has no administration in time range)  carvedilol (COREG) tablet 12.5 mg (12.5 mg Oral Given 06/27/20 0801)  ferrous sulfate tablet 325 mg (325 mg Oral Given 06/27/20 0801)  aspirin EC tablet 81 mg (has no administration in time range)  insulin aspart (novoLOG) injection 0-20 Units (3 Units Subcutaneous Given 06/27/20 0801)  potassium chloride SA (KLOR-CON) CR tablet 40 mEq (40 mEq Oral Not Given 06/27/20 0649)  furosemide (LASIX) injection 40 mg (40 mg Intravenous Given 06/27/20 0144)  iohexol (OMNIPAQUE) 350 MG/ML injection 80 mL (80 mLs Intravenous Contrast Given 06/27/20 0230)  ipratropium-albuterol (DUONEB) 0.5-2.5 (3) MG/3ML nebulizer solution 3 mL (3 mLs Nebulization Given 06/27/20 0357)    ED Course  I have reviewed the triage vital signs and the nursing notes.  Pertinent labs & imaging results that were available during my care of the patient were reviewed by me and considered in my medical decision making (see chart for details).    MDM Rules/Calculators/A&P                          64 year old female with a history of chronic respiratory failure with hypoxia on home O2, chronic diastolic CHF, diabetes mellitus type 2, GI bleed, HTN, pulmonary hypertension, CKD stage IIIb, and neck mass who presents the emergency department from her PCPs office with a chief complaint of hypoxia.  She was noted to be satting at 80 to 82% on 2 L nasal cannula, her usual home oxygen amount, when she presented for a follow-up appointment this morning.  Patient was recently  admitted for 7 days for a CHF exacerbation.  Notably, she reports that her weights have been stable since discharge, but she is having swelling in  her abdomen and lower extremity edema as well as worsening shortness of breath.  We will give an IV dose of her home Lasix.  The patient was discussed with Dr. Randal Buba, attending physician.  Patient is satting in the mid 90s on 2 L on my exam.  Vital signs are otherwise reassuring.  No tachycardia or tachypnea.  Given acute onset hypoxia, and recent hospitalization could also consider PE.  Patient does report a history of PE and DVT and she is not anticoagulated as she previously had a GI bleed.  CT PE study was obtained.  Negative for PE.  However, there is a large right-sided pleural effusion with near complete collapse of the right lower lobe.  There is also a small left-sided pleural effusion.  There is also an opacity in the right middle lobe that is concerning for developing infiltrate.  Patient does note that she has had a productive cough over the last few days, but no constitutional symptoms.  There is also noted to be a mosaic appearance of the lung parenchyma that can be seen in small airway disease or chronic pulmonary hypertension.  The main pulmonary artery is dilated.  There is also new fat stranding around the left ureter with a small amount of the retroperitoneal free fluid that is concerning for a UTI.  Urinalysis is pending.  Hemoglobin is 8.0, down from 9.4 previously.  She is not actively having any signs or symptoms of bleeding.  Troponin is elevated at 276, which is downtrending from previous.  Likely secondary to CHF.  Patient adamantly denies any chest pain; doubt ACS.  BNP is elevated from previous at 723.  No leukocytosis.   Consulted internal medicine residency team for admission for CHF exacerbation.  Urinalysis is still pending.  Internal medicine residency team has accepted the patient for admission. The patient appears reasonably stabilized for admission considering the current resources, flow, and capabilities available in the ED at this time, and I doubt any other [facility]  requiring further screening and/or treatment in the ED prior to admission.   Final Clinical Impression(s) / ED Diagnoses Final diagnoses:  Acute respiratory failure with hypoxia (HCC)  Acute on chronic diastolic congestive heart failure ([place])  Pulmonary hypertension [facility])    Rx / DC Orders ED Discharge Orders    None       Joanne Gavel, PA-C 06/27/20 0805    Palumbo, April, MD 06/27/20 2307

## 2020-06-26 NOTE — ED Triage Notes (Signed)
Pt arrives POV for eval of worsening SOB since being d/c'd from here last week. Pt reports PCP sent her over here for further eval after appt this AM.

## 2020-06-27 ENCOUNTER — Observation Stay (HOSPITAL_COMMUNITY): Payer: PPO

## 2020-06-27 ENCOUNTER — Emergency Department (HOSPITAL_COMMUNITY): Payer: PPO

## 2020-06-27 DIAGNOSIS — Z7984 Long term (current) use of oral hypoglycemic drugs: Secondary | ICD-10-CM | POA: Diagnosis not present

## 2020-06-27 DIAGNOSIS — I272 Pulmonary hypertension, unspecified: Secondary | ICD-10-CM | POA: Diagnosis present

## 2020-06-27 DIAGNOSIS — I4819 Other persistent atrial fibrillation: Secondary | ICD-10-CM | POA: Diagnosis present

## 2020-06-27 DIAGNOSIS — N1832 Chronic kidney disease, stage 3b: Secondary | ICD-10-CM | POA: Diagnosis present

## 2020-06-27 DIAGNOSIS — I493 Ventricular premature depolarization: Secondary | ICD-10-CM | POA: Diagnosis present

## 2020-06-27 DIAGNOSIS — Z7982 Long term (current) use of aspirin: Secondary | ICD-10-CM | POA: Diagnosis not present

## 2020-06-27 DIAGNOSIS — Z20822 Contact with and (suspected) exposure to covid-19: Secondary | ICD-10-CM | POA: Diagnosis present

## 2020-06-27 DIAGNOSIS — I5033 Acute on chronic diastolic (congestive) heart failure: Secondary | ICD-10-CM | POA: Diagnosis present

## 2020-06-27 DIAGNOSIS — Z888 Allergy status to other drugs, medicaments and biological substances status: Secondary | ICD-10-CM | POA: Diagnosis not present

## 2020-06-27 DIAGNOSIS — I130 Hypertensive heart and chronic kidney disease with heart failure and stage 1 through stage 4 chronic kidney disease, or unspecified chronic kidney disease: Secondary | ICD-10-CM | POA: Diagnosis present

## 2020-06-27 DIAGNOSIS — J9621 Acute and chronic respiratory failure with hypoxia: Secondary | ICD-10-CM | POA: Diagnosis present

## 2020-06-27 DIAGNOSIS — R17 Unspecified jaundice: Secondary | ICD-10-CM | POA: Diagnosis present

## 2020-06-27 DIAGNOSIS — D72829 Elevated white blood cell count, unspecified: Secondary | ICD-10-CM | POA: Diagnosis present

## 2020-06-27 DIAGNOSIS — Z809 Family history of malignant neoplasm, unspecified: Secondary | ICD-10-CM | POA: Diagnosis not present

## 2020-06-27 DIAGNOSIS — E1122 Type 2 diabetes mellitus with diabetic chronic kidney disease: Secondary | ICD-10-CM | POA: Diagnosis present

## 2020-06-27 DIAGNOSIS — G8929 Other chronic pain: Secondary | ICD-10-CM | POA: Diagnosis present

## 2020-06-27 DIAGNOSIS — E049 Nontoxic goiter, unspecified: Secondary | ICD-10-CM | POA: Insufficient documentation

## 2020-06-27 DIAGNOSIS — I509 Heart failure, unspecified: Secondary | ICD-10-CM

## 2020-06-27 DIAGNOSIS — R8271 Bacteriuria: Secondary | ICD-10-CM | POA: Diagnosis present

## 2020-06-27 DIAGNOSIS — Z79899 Other long term (current) drug therapy: Secondary | ICD-10-CM | POA: Diagnosis not present

## 2020-06-27 DIAGNOSIS — E079 Disorder of thyroid, unspecified: Secondary | ICD-10-CM | POA: Diagnosis not present

## 2020-06-27 DIAGNOSIS — R0602 Shortness of breath: Secondary | ICD-10-CM | POA: Diagnosis present

## 2020-06-27 DIAGNOSIS — R7402 Elevation of levels of lactic acid dehydrogenase (LDH): Secondary | ICD-10-CM | POA: Diagnosis present

## 2020-06-27 DIAGNOSIS — J918 Pleural effusion in other conditions classified elsewhere: Secondary | ICD-10-CM | POA: Diagnosis present

## 2020-06-27 DIAGNOSIS — J9819 Other pulmonary collapse: Secondary | ICD-10-CM | POA: Diagnosis present

## 2020-06-27 DIAGNOSIS — R809 Proteinuria, unspecified: Secondary | ICD-10-CM | POA: Diagnosis present

## 2020-06-27 DIAGNOSIS — Z885 Allergy status to narcotic agent status: Secondary | ICD-10-CM | POA: Diagnosis not present

## 2020-06-27 DIAGNOSIS — E041 Nontoxic single thyroid nodule: Secondary | ICD-10-CM | POA: Diagnosis present

## 2020-06-27 LAB — CBC WITH DIFFERENTIAL/PLATELET
Abs Immature Granulocytes: 0.01 10*3/uL (ref 0.00–0.07)
Basophils Absolute: 0 10*3/uL (ref 0.0–0.1)
Basophils Relative: 0 %
Eosinophils Absolute: 0.1 10*3/uL (ref 0.0–0.5)
Eosinophils Relative: 2 %
HCT: 26.1 % — ABNORMAL LOW (ref 36.0–46.0)
Hemoglobin: 8 g/dL — ABNORMAL LOW (ref 12.0–15.0)
Immature Granulocytes: 0 %
Lymphocytes Relative: 18 %
Lymphs Abs: 1 10*3/uL (ref 0.7–4.0)
MCH: 26.7 pg (ref 26.0–34.0)
MCHC: 30.7 g/dL (ref 30.0–36.0)
MCV: 87 fL (ref 80.0–100.0)
Monocytes Absolute: 0.5 10*3/uL (ref 0.1–1.0)
Monocytes Relative: 10 %
Neutro Abs: 3.7 10*3/uL (ref 1.7–7.7)
Neutrophils Relative %: 70 %
Platelets: 111 10*3/uL — ABNORMAL LOW (ref 150–400)
RBC: 3 MIL/uL — ABNORMAL LOW (ref 3.87–5.11)
RDW: 16.5 % — ABNORMAL HIGH (ref 11.5–15.5)
WBC: 5.3 10*3/uL (ref 4.0–10.5)
nRBC: 0 % (ref 0.0–0.2)

## 2020-06-27 LAB — BASIC METABOLIC PANEL
Anion gap: 10 (ref 5–15)
BUN: 16 mg/dL (ref 8–23)
CO2: 30 mmol/L (ref 22–32)
Calcium: 8.6 mg/dL — ABNORMAL LOW (ref 8.9–10.3)
Chloride: 102 mmol/L (ref 98–111)
Creatinine, Ser: 1.2 mg/dL — ABNORMAL HIGH (ref 0.44–1.00)
GFR calc Af Amer: 55 mL/min — ABNORMAL LOW (ref >60)
GFR calc non Af Amer: 47 mL/min — ABNORMAL LOW (ref >60)
Glucose, Bld: 171 mg/dL — ABNORMAL HIGH (ref 70–99)
Potassium: 3 mmol/L — ABNORMAL LOW (ref 3.5–5.1)
Sodium: 142 mmol/L (ref 135–145)

## 2020-06-27 LAB — PROCALCITONIN: Procalcitonin: <0.1 ng/mL

## 2020-06-27 LAB — URINALYSIS, ROUTINE W REFLEX MICROSCOPIC
Bilirubin Urine: NEGATIVE
Glucose, UA: NEGATIVE mg/dL
Ketones, ur: NEGATIVE mg/dL
Nitrite: NEGATIVE
Protein, ur: 30 mg/dL — AB
Specific Gravity, Urine: 1.015 (ref 1.005–1.030)
WBC, UA: >50 WBC/hpf — ABNORMAL HIGH (ref 0–5)
pH: 5 (ref 5.0–8.0)

## 2020-06-27 LAB — HEPATIC FUNCTION PANEL
ALT: 16 U/L (ref 0–44)
AST: 22 U/L (ref 15–41)
Albumin: 3.5 g/dL (ref 3.5–5.0)
Alkaline Phosphatase: 70 U/L (ref 38–126)
Bilirubin, Direct: 0.6 mg/dL — ABNORMAL HIGH (ref 0.0–0.2)
Indirect Bilirubin: 1.8 mg/dL — ABNORMAL HIGH (ref 0.3–0.9)
Total Bilirubin: 2.4 mg/dL — ABNORMAL HIGH (ref 0.3–1.2)
Total Protein: 7.3 g/dL (ref 6.5–8.1)

## 2020-06-27 LAB — CBG MONITORING, ED
Glucose-Capillary: 148 mg/dL — ABNORMAL HIGH (ref 70–99)
Glucose-Capillary: 129 mg/dL — ABNORMAL HIGH (ref 70–99)
Glucose-Capillary: 129 mg/dL — ABNORMAL HIGH (ref 70–99)

## 2020-06-27 LAB — TROPONIN I (HIGH SENSITIVITY)
Troponin I (High Sensitivity): 276 ng/L (ref <18)
Troponin I (High Sensitivity): 295 ng/L (ref <18)
Troponin I (High Sensitivity): 293 ng/L (ref <18)

## 2020-06-27 LAB — RETICULOCYTES
Immature Retic Fract: 15.8 % (ref 2.3–15.9)
RBC.: 2.96 MIL/uL — ABNORMAL LOW (ref 3.87–5.11)
Retic Count, Absolute: 24 10*3/uL (ref 19.0–186.0)
Retic Ct Pct: 0.8 % (ref 0.4–3.1)

## 2020-06-27 LAB — GLUCOSE, CAPILLARY: Glucose-Capillary: 138 mg/dL — ABNORMAL HIGH (ref 70–99)

## 2020-06-27 LAB — BRAIN NATRIURETIC PEPTIDE: B Natriuretic Peptide: 723.7 pg/mL — ABNORMAL HIGH (ref 0.0–100.0)

## 2020-06-27 LAB — TSH: TSH: 0.968 u[IU]/mL (ref 0.350–4.500)

## 2020-06-27 LAB — LACTATE DEHYDROGENASE: LDH: 249 U/L — ABNORMAL HIGH (ref 98–192)

## 2020-06-27 LAB — SARS CORONAVIRUS 2 BY RT PCR (HOSPITAL ORDER, PERFORMED IN ~~LOC~~ HOSPITAL LAB): SARS Coronavirus 2: NEGATIVE

## 2020-06-27 MED ORDER — POTASSIUM CHLORIDE CRYS ER 20 MEQ PO TBCR
40.0000 meq | EXTENDED_RELEASE_TABLET | Freq: Two times a day (BID) | ORAL | Status: AC
  Administered 2020-06-27 (×2): 40 meq via ORAL
  Filled 2020-06-27 (×2): qty 2

## 2020-06-27 MED ORDER — AZITHROMYCIN 250 MG PO TABS: 500.0000 mg | ORAL_TABLET | Freq: Every day | ORAL | Status: DC

## 2020-06-27 MED ORDER — CARVEDILOL 12.5 MG PO TABS
12.5000 mg | ORAL_TABLET | Freq: Two times a day (BID) | ORAL | Status: DC
  Administered 2020-06-27 – 2020-06-29 (×5): 12.5 mg via ORAL
  Filled 2020-06-27 (×5): qty 1

## 2020-06-27 MED ORDER — IPRATROPIUM-ALBUTEROL 0.5-2.5 (3) MG/3ML IN SOLN
3.0000 mL | Freq: Once | RESPIRATORY_TRACT | Status: AC
  Administered 2020-06-27: 3 mL via RESPIRATORY_TRACT
  Filled 2020-06-27: qty 3

## 2020-06-27 MED ORDER — IOHEXOL 350 MG/ML SOLN
80.0000 mL | Freq: Once | INTRAVENOUS | Status: AC | PRN
  Administered 2020-06-27: 80 mL via INTRAVENOUS

## 2020-06-27 MED ORDER — FUROSEMIDE 10 MG/ML IJ SOLN
40.0000 mg | Freq: Once | INTRAMUSCULAR | Status: AC
  Administered 2020-06-27: 40 mg via INTRAVENOUS
  Filled 2020-06-27: qty 4

## 2020-06-27 MED ORDER — SODIUM CHLORIDE 0.9 % IV SOLN
2.0000 g | Freq: Every day | INTRAVENOUS | Status: DC
  Administered 2020-06-27: 2 g via INTRAVENOUS
  Filled 2020-06-27: qty 20

## 2020-06-27 MED ORDER — FERROUS SULFATE 325 (65 FE) MG PO TABS
325.0000 mg | ORAL_TABLET | Freq: Two times a day (BID) | ORAL | Status: DC
  Administered 2020-06-27 – 2020-06-29 (×5): 325 mg via ORAL
  Filled 2020-06-27 (×5): qty 1

## 2020-06-27 MED ORDER — INSULIN ASPART 100 UNIT/ML ~~LOC~~ SOLN
0.0000 [IU] | Freq: Three times a day (TID) | SUBCUTANEOUS | Status: DC
  Administered 2020-06-27 – 2020-06-28 (×4): 3 [IU] via SUBCUTANEOUS
  Administered 2020-06-28: 4 [IU] via SUBCUTANEOUS
  Administered 2020-06-29 (×2): 3 [IU] via SUBCUTANEOUS

## 2020-06-27 MED ORDER — POTASSIUM CHLORIDE CRYS ER 20 MEQ PO TBCR
40.0000 meq | EXTENDED_RELEASE_TABLET | Freq: Two times a day (BID) | ORAL | Status: DC
  Filled 2020-06-27: qty 2

## 2020-06-27 MED ORDER — ASPIRIN EC 81 MG PO TBEC
81.0000 mg | DELAYED_RELEASE_TABLET | Freq: Every day | ORAL | Status: DC
  Administered 2020-06-27 – 2020-06-29 (×3): 81 mg via ORAL
  Filled 2020-06-27 (×3): qty 1

## 2020-06-27 NOTE — Progress Notes (Addendum)
Subjective: Interviewed patient at bedside.  Patient reports that she was getting more short of breath and started coughing on Wednesday. She reports not knowing what went wrong.  She reports urinating a lot since getting the lasix. She reports some improvement in her breathing. She is on 2L [place] at home. She reports that she doesn't know if she had weight gain since she left the hospital.  Weight was around 257 lbs after she left the hospital, went up a bit since she left.  She reports that she didn't drink anything or eat a lot, she was worried about having to come back in here. She reports that she's not sure why she was having more SOB.  Discussed that we are stopping antibiotics.  Discussed that we will contact IR to see if they can do a thoracentesis today.  Will get an ultrasound to look at her neck.     Objective:  Vital signs in last 24 hours: Vitals:   06/27/20 0200 06/27/20 0359 06/27/20 0556 06/27/20 0600  BP: 124/75  129/72 135/78  Pulse: 60 77 74 71  Resp:  20    Temp:      TempSrc:      SpO2: 94% 98% 97% 99%  Weight:      Height:       Physical Exam Vitals and nursing note reviewed.  Constitutional:      General: She is not in acute distress.    Appearance: She is well-developed. She is not ill-appearing or toxic-appearing.  HENT:     Head: Normocephalic and atraumatic.  Eyes:     Extraocular Movements: Extraocular movements intact.  Cardiovascular:     Rate and Rhythm: Normal rate. Rhythm irregular.     Pulses: Normal pulses.          Radial pulses are 2+ on the right side and 2+ on the left side.       Dorsalis pedis pulses are 2+ on the right side and 2+ on the left side.     Heart sounds: Normal heart sounds, S1 normal and S2 normal. No murmur heard.   Pulmonary:     Effort: Pulmonary effort is normal. No respiratory distress.     Breath sounds: Examination of the right-middle field reveals decreased breath sounds. Examination of the right-lower field  reveals decreased breath sounds. Decreased breath sounds and wheezing present.     Comments: No sounds heard within the RML and RLL Abdominal:     General: Abdomen is flat. There is no distension.     Palpations: Abdomen is soft. There is no mass.     Comments: Mild chronic tenderness present in LLQ  Musculoskeletal:     Right lower leg: No edema.     Left lower leg: No edema.  Neurological:     General: No focal deficit present.     Mental Status: She is alert. Mental status is at baseline.  Psychiatric:        Mood and Affect: Mood normal.        Behavior: Behavior normal.        Thought Content: Thought content normal.      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 Recent hospitalization 7/8 to 7/13 due to acute exacerbation.   Active Problems:   Acute on chronic respiratory failure (HCC)  Acute on chronic respiratory failure Secondary to HFpEF exacerbation Patient has history of chronic respiratory failure requiring 2L O2 at home, however desaturated to 80% 7/19 while at office visit with PCP. Additionally, patient reports shortness of breath and cough for six days. Upon examination, patient desatting to low 80s with minimal movement in bed. She is mildly hypervolemic on exam. BNP today of 723 which is mildly elevated from 06/16/2020 - 675.6. No fever or leukocytosis. CT Chest showed near complete collapse of RLL and RML infiltrate. Received ceftriaxone, azithromycin and 40mg  lasix in ED. Procalcitonin was found to be within normal limits making an infection unlikely so antibiotics were stopped. Chest X showed  large right-sided pleural effusion with near complete collapse of the right lower lobe. Will have IR drain fluid. After drainage if shortness of breath continue will consider further doses of IV lasix. Have held home dose of lasix for now will reassess. -Stopped Azithromycin  -Stopped ceftriaxone  -Legionella urine antigen pending -strep pneumoniae urine antigen pending -procalcitonin was within normal limits   Elevated bilirubin Patient has had elevated bilirubin in past (2.3 on 06/16/2020), on admission her total bilirubin today was 2.6. She did have a positive direct coombs test on 04/22/20. -haptoglobin -LDH elevated at 249 -hepatic function panel showed total bili- 2.4, direct bili- 0.6, indirect bili- 1.8   Possible ascending UTI on imaging Patient denies symptoms of dysuria, urgency and frequency. However, CT findings raise concern for ascending UTI. -UA   Troponinemia Troponin of 276 on arrival, repeat 295. Likely demand ischemia. Prior hospitalization 06/16/20, troponin of 314 on presentation. -Trend troponin   Persistent atrial fibrillation She is not currently on anticoagulation due to history of GI bleeds. 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  Her abdominal pain is consistent with prior and only occurs with palpation of left lower quadrant. -continue following with GI in outpatient setting   T2DM HbA1c 7.6 (12/31/2019) -SSI   Code status: FULL VTE ppx: SCDs Diet: Carb modified IVF: None   Prior to Admission Living Arrangement: home Anticipated Discharge Location: home Barriers to Discharge:  continuous acute on chronic exacerbations Dispo: Anticipated discharge in approximately 1-3 day(s).   Briant Cedar, MD 06/27/2020, 6:13 AM Pager: [phone] After 5pm on weekdays and 1pm on weekends: On Call pager [phone]

## 2020-06-27 NOTE — ED Notes (Signed)
Dinner Tray Ordered @ 1709. 

## 2020-06-27 NOTE — H&P (Addendum)
Date: 06/27/2020               Patient Name:  Rita [name] MRN: [id number]  DOB: 1956-03-24 Age / Sex: 64 y.o., female   PCP: [name], MD         Medical Service: Internal Medicine Teaching Service         Attending Physician: Dr. Velna Ochs, MD    First Contact: Dr. Lestine Mount Pager: [phone]  Second Contact: Dr. Lonia Skinner Pager: [phone]       After Hours (After 5p/  First Contact Pager: [phone]  weekends / holidays): Second Contact Pager: [phone]   Chief Complaint: Cough and shortness of breath  History of Present Illness: Sandrina Heaton. 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  Patient was recently admitted to our service from 06/15/2020 to 06/20/2020 for HFpEF exacerbation. Since her discharge, she reports attempting to limit her fluid intake, eat a low sodium diet and remain adherent to her home lasix (40 in AM, 40 as needed in PM). However, on Wednesday she began to experience the development of a cough and shortness of breath. She describes the cough as being dry and episodic, while her shortness of breath was worsened from her baseline and unrelated to body positioning. Neither of these symptoms have progressed since their onset on Wednesday, however, she has occassionally  increased her home oxygen from her regular 2L to 3L in an attempt to relieve these symptoms. She had a post-hospitalization follow-up visit with her PCP, Dr. Bea Graff, earlier today and noted to have oxygen saturation of 80-82% despite being on home 2L [place]. She was sent to [facility] for evaluation of hypoxia, cough and shortness of breath. On review of systems, she endorses chest pain  only while coughing, dizziness when rising from a seated position, and left-lower quadrant abdominal pain which is a chronic problem for her. She otherwise denies fevers, chills, weight gain, leg swelling, vomiting, diarrhea, pain with urination, increased frequency or urgency.  ED Course: On arrival to the ED, patient was hemodynamically stable, afebrile, saturating at 100% on 2L [place]. CBC WBC 6.2, Hgb 9.4; CMP revealed Cr 1.37, TBili 2.6; COVID negative; BNP 723.7; troponin 276. CXR, CTA Chest, and CT Abdomen/Pelvis were performed. She received 40mg  IV lasix and started on ceftriaxone and azithromycin. She was admitted to Internal Medicine Teaching Service for further evaluation and treatment.  Meds:  Current Meds  Medication Sig  . allopurinol (ZYLOPRIM) 100 MG tablet Take 100 mg by mouth 2 (two) times daily.  Marland Kitchen aspirin EC 81 MG tablet Take 81 mg by mouth daily. Swallow whole.  . carvedilol (COREG) 25 MG tablet Take 12.5 mg by mouth 2 (two) times daily with a meal.   . ferrous sulfate 325 (65 FE) MG tablet Take 325 mg by mouth 2 (two) times daily with a meal.   . furosemide (LASIX) 40 MG tablet Take 40 mg by mouth See admin instructions. Take one tablet (40 mg) by mouth every morning, may take a 2nd tablet (40 mg) in the evening (4-5pm) as needed for fluid/swelling  . glipiZIDE (GLUCOTROL XL) 5 MG 24 hr tablet Take 5 mg by mouth daily with breakfast.   . metFORMIN (GLUCOPHAGE-XR)  500 MG 24 hr tablet Take 500 mg by mouth daily with breakfast.  . OXYGEN Inhale 2 L into the lungs continuous.  . pantoprazole (PROTONIX) 40 MG tablet Take 40 mg by mouth daily.  . potassium chloride SA (KLOR-CON) 20 MEQ tablet Take 20 mEq by mouth daily.    Allergies: Allergies as of 06/26/2020 - Review Complete 06/26/2020  Allergen Reaction Noted  . Codeine Other (See Comments) 12/30/2019  . Prednisone Other (See Comments) 12/30/2019  . Moxifloxacin Rash 12/30/2019   Past Medical History:  Diagnosis Date  .  Atrial fibrillation ([place])   . Chronic respiratory failure with hypoxia, on home O2 therapy ([place])   . Diabetes mellitus without complication ([place])   . Diastolic CHF ([place])   . Hypertension    Family History:  Family History  Problem Relation Age of Onset  . Cancer Mother    Social History:  Lives at home with her husband She has never smoked She is currently retired  Review of Systems: A complete ROS was negative except as per HPI.  Physical Exam: Blood pressure 124/75, pulse 77, temperature 97.8 F (36.6 C), temperature source Oral, resp. rate 20, height 5\' 1"  (1.549 m), weight 118 kg, SpO2 98 %. Physical Exam Constitutional:      Appearance: She is obese.  HENT:     Head: Normocephalic and atraumatic.  Eyes:     Extraocular Movements: Extraocular movements intact.  Cardiovascular:     Rate and Rhythm: Normal rate. Rhythm irregular.  Pulmonary:     Comments: On 2L [place]. Absent breath sounds in RLL and RML. Decreased breath sounds throughout all lung fields with scattered crackles. Abdominal:     General: Bowel sounds are normal.     Palpations: Abdomen is soft.     Tenderness: There is abdominal tenderness.     Comments: Tenderness to palpation in left lower quadrant  Musculoskeletal:        General: Normal range of motion.     Cervical back: Normal range of motion and neck supple.     Right lower leg: Edema present.     Left lower leg: Edema present.     Comments: 1+ pitting edema of bilateral lower extremities  Skin:    General: Skin is warm and dry.  Neurological:     General: No focal deficit present.     Mental Status: She is alert and oriented to person, place, and time.  Psychiatric:        Mood and Affect: Mood normal.        Behavior: Behavior normal.    EKG:  -Atrial fibrillation with premature ventricular or aberrantly conducted complexes, right axis deviation, low voltage QRS, nonspecific ST and T wave abnormality  CXR:  -Small bilateral pleural  effusions with persistent bibasilar airspace consolidations, right slightly greater than left. Overall, little interval change from prior.  CTA Chest:  -No acute pulmonary embolism. Large right-sided pleural effusion with near complete collapse of the right lower lobe. Small left-sided pleural effusion. Airspace opacity in the right middle lobe concerning for a developing infiltrate. Mosaic appearance of the lung parenchyma, which can be seen in patients with small airways disease or chronic pulmonary hypertension. The main pulmonary artery is dilated suggestive of elevated pulmonary artery pressures. Cardiomegaly.  CT Abdomen/Pelvis:  -New fat stranding about the left ureter with a small amount of new retroperitoneal free fluid. Findings are concerning for an ascending urinary tract infection and should be correlated with urinalysis. Hepatic steatosis.  Assessment & Plan by Problem: Active Problems:   Acute on chronic respiratory failure (HCC)  Danial D. 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  #Acute on chronic respiratory failure Patient has history of chronic respiratory failure requiring 2L O2 at home, however desaturated to 80% today at office visit with PCP. Additionally, patient reports shortness of breath and cough for six days. Upon examination, patient desatting to low 80s with minimal movement in bed. She is mildly hypervolemic on exam. BNP today of 723 which is mildly elevated from 06/16/2020 - 675.6. No fever or leukocytosis. CT Chest showed near complete collapse of RLL collapse and RML infiltrate. Received ceftriaxone, azithromycin and  40mg  lasix in ED. -Continue Azithromycin -Continue ceftriaxone -Legionella urine antigen -strep pneumoniae urine antigen -procalcitonin -Lasix  #Elevated bilirubin Patient has had elevated bilirubin in past (2.3 on 06/16/2020), however total bilirubin today of 2.6. She did have a positive direct coombs test on 04/22/20. -haptoglobin -LDH -hepatic function panel  #Concern of UTI on imaging Patient denies symptoms of dysuria, urgency and frequency. However, CT findings raise concern for ascending UTI. -UA  #Troponinemia Troponin of 276 on arrival, repeat 295. Likely demand ischemia. Prior hospitalization 06/16/20, troponin of 314 on presentation. -Trend troponin  #Persistent atrial fibrillation She is not currently on anticoagulation due to history of GI bleeds. 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  Her abdominal pain is consistent with prior and only occurs with palpation of left lower quadrant. -continue following with GI in outpatient setting  #T2DM HbA1c 7.6 (12/31/2019) -SSI  Code status: FULL VTE ppx: SCDs Diet: Carb modified IVF: None  Dispo: Admit patient to Inpatient with expected length of stay greater than 2 midnights.  Signed: Paulla Dolly, MD 06/27/2020, 4:25 AM  Pager: [phone] After 5pm on weekdays and 1pm on weekends: On Call pager: [phone]

## 2020-06-27 NOTE — ED Notes (Signed)
Lunch Tray Ordered @ 1045 

## 2020-06-28 ENCOUNTER — Inpatient Hospital Stay (HOSPITAL_COMMUNITY): Payer: PPO

## 2020-06-28 HISTORY — PX: IR THORACENTESIS ASP PLEURAL SPACE W/IMG GUIDE: IMG5380

## 2020-06-28 LAB — BASIC METABOLIC PANEL
Anion gap: 10 (ref 5–15)
BUN: 16 mg/dL (ref 8–23)
CO2: 32 mmol/L (ref 22–32)
Calcium: 9 mg/dL (ref 8.9–10.3)
Chloride: 98 mmol/L (ref 98–111)
Creatinine, Ser: 1.39 mg/dL — ABNORMAL HIGH (ref 0.44–1.00)
GFR calc Af Amer: 46 mL/min — ABNORMAL LOW (ref >60)
GFR calc non Af Amer: 39 mL/min — ABNORMAL LOW (ref >60)
Glucose, Bld: 122 mg/dL — ABNORMAL HIGH (ref 70–99)
Potassium: 4.1 mmol/L (ref 3.5–5.1)
Sodium: 140 mmol/L (ref 135–145)

## 2020-06-28 LAB — PROTEIN, PLEURAL OR PERITONEAL FLUID: Total protein, fluid: 3.6 g/dL

## 2020-06-28 LAB — CBC
HCT: 28.2 % — ABNORMAL LOW (ref 36.0–46.0)
Hemoglobin: 8.8 g/dL — ABNORMAL LOW (ref 12.0–15.0)
MCH: 27.2 pg (ref 26.0–34.0)
MCHC: 31.2 g/dL (ref 30.0–36.0)
MCV: 87 fL (ref 80.0–100.0)
Platelets: 116 10*3/uL — ABNORMAL LOW (ref 150–400)
RBC: 3.24 MIL/uL — ABNORMAL LOW (ref 3.87–5.11)
RDW: 16.5 % — ABNORMAL HIGH (ref 11.5–15.5)
WBC: 4.9 10*3/uL (ref 4.0–10.5)
nRBC: 0 % (ref 0.0–0.2)

## 2020-06-28 LAB — GRAM STAIN

## 2020-06-28 LAB — STREP PNEUMONIAE URINARY ANTIGEN: Strep Pneumo Urinary Antigen: NEGATIVE

## 2020-06-28 LAB — GLUCOSE, CAPILLARY
Glucose-Capillary: 135 mg/dL — ABNORMAL HIGH (ref 70–99)
Glucose-Capillary: 112 mg/dL — ABNORMAL HIGH (ref 70–99)
Glucose-Capillary: 168 mg/dL — ABNORMAL HIGH (ref 70–99)
Glucose-Capillary: 145 mg/dL — ABNORMAL HIGH (ref 70–99)

## 2020-06-28 LAB — GLUCOSE, PLEURAL OR PERITONEAL FLUID: Glucose, Fluid: 124 mg/dL

## 2020-06-28 LAB — LEGIONELLA PNEUMOPHILA SEROGP 1 UR AG: L. pneumophila Serogp 1 Ur Ag: NEGATIVE

## 2020-06-28 LAB — TROPONIN I (HIGH SENSITIVITY): Troponin I (High Sensitivity): 288 ng/L (ref <18)

## 2020-06-28 LAB — LACTATE DEHYDROGENASE: LDH: 239 U/L — ABNORMAL HIGH (ref 98–192)

## 2020-06-28 LAB — PROCALCITONIN: Procalcitonin: <0.1 ng/mL

## 2020-06-28 LAB — LACTATE DEHYDROGENASE, PLEURAL OR PERITONEAL FLUID: LD, Fluid: 92 U/L — ABNORMAL HIGH (ref 3–23)

## 2020-06-28 LAB — HAPTOGLOBIN: Haptoglobin: 78 mg/dL (ref 37–355)

## 2020-06-28 MED ORDER — LIDOCAINE HCL 1 % IJ SOLN
INTRAMUSCULAR | Status: AC
  Filled 2020-06-28: qty 20

## 2020-06-28 MED ORDER — FUROSEMIDE 10 MG/ML IJ SOLN
80.0000 mg | Freq: Every day | INTRAMUSCULAR | Status: DC
  Administered 2020-06-28 – 2020-06-29 (×2): 80 mg via INTRAVENOUS
  Filled 2020-06-28 (×2): qty 8

## 2020-06-28 MED ORDER — LIDOCAINE HCL 1 % IJ SOLN
INTRAMUSCULAR | Status: DC | PRN
  Administered 2020-06-28: 10 mL

## 2020-06-28 NOTE — Procedures (Signed)
PROCEDURE SUMMARY:  Successful image-guided right thoracentesis. Yielded 650 milliliters of serosanguineous fluid. Patient tolerated procedure well. EBL: Zero No immediate complications.  Specimen was sent for labs. Post procedure CXR shows no pneumothorax.  Please see imaging section of Epic for full dictation.  Joaquim Nam PA-C 06/28/2020 12:13 PM

## 2020-06-28 NOTE — Progress Notes (Signed)
Subjective: Patient appeared well and was sitting on chair with feet elevated during interview. Patient denied worsening of symptoms, we described to the patient the plan for today and made her aware of anticipated IR thoracentesis and the potential but rare complications for it. All questions were answered by the team.   Objective:  Vital signs in last 24 hours: Vitals:   06/28/20 0348 06/28/20 0843 06/28/20 1156 06/28/20 1235  BP: 133/75 119/66 136/63 130/70  Pulse: 73 (!) 58  (!) 59  Resp: 19 20  20   Temp: 98.2 F (36.8 C) 98.2 F (36.8 C)  98.3 F (36.8 C)  TempSrc: Oral Oral  Oral  SpO2: 92% 95%  100%  Weight: 115.8 kg     Height:       Weight change: -2.151 kg  Intake/Output Summary (Last 24 hours) at 06/28/2020 1308 Last data filed at 06/28/2020 1100 Gross per 24 hour  Intake 600 ml  Output 700 ml  Net -100 ml   Physical Exam: GEN: NAD, lying in bed. Patient is obese.  CV: Normal rate, irregular rhythm. No murmurs, rubs, or gallops PULM: RML revealed decreased breath sounds. RLL reveals decreased breath sounds and wheezing. Patient is on 2L [place].  ABD: Soft, NT/ND, +BS. Tenderness to palpation in LLQ, however this is baseline.  EXT: 1+ pitting edema to her mid shins bilaterally  Labs: Legionella: Negative Strep pneumo: Negative  Assessment/Plan: This is day 1 for 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    Active Problems:   Acute on chronic diastolic congestive heart failure (HCC)   Acute on chronic respiratory failure (HCC)   Heart failure (HCC) Acute on  chronic respiratory failure, secondary to HFpEF exacerbation. Patient has a history of chronic respiratory failure requiring 2L O2 at home, however desaturated to 80% 7/19 while at office visit with PCP. Additionally, patient had reported SOB + cough for 6 days. Upon admission, patient desatting to low 80s with minimal mvmt in bed, along with mild hypervolemia. BNP was 723, which is mildly elevated from 06/16/2020- 675.6. Has remained afebrile and no leukocytosis. CXR showed large right-sided pleural effusion with near complete collapse of the RLL. CT Chest showed near complete collapse of RLL and RML infiltrate. Initially had suspicions for infection or possible pneumonia, however diagnostic lab markers were found to be WNL so discontinued ceftriaxone and azithromycin that was received in ED. Patient was receiving 40 mg lasix on admission however patient was still found to be hypervolemic on exam so increased dose to 80 mg lasix, will re-assess tomorrow to see if this has improved symptoms. Hope for this change to also address the SOB that was still present since patient has been saturating well on home 2L [place]. Patient recently underwent diagnostic & therapeutic IR thoracentesis for right sided effusion. Plan:  --Continue IV lasix, monitor response from 40-->80 mg  --Continue monitoring for improvement of SOB presenting symptoms   Right pleural effusion. Initially concerned for PNA, however patient has remained afebrile without leukocytosis and recent procalcitonin was undetectable.  Patient recently underwent diagnostic & therapeutic IR thoracentesis, awaiting labs for further diagnostic measurements to see if this  is a transudative or exudative process. Likely is transudative given the patient's HF, however will await labs for further mgmt.  Plan: --Pending cytology, non PAP  --Pending LDH (pleural fluid) --Pending gram stain  --Pending protein (pleural fluid) --Pending glucose (pleural fluid)  Type 2  NSTEMI. Prior hospitalization 06/16/20, troponin of 314 on presentation. Patient has denied chest pain, but troponin levels were concerning. Troponin was elevated on admission at 285, but it was remained flat on subsequent measurements -> 276 -> 295 -> 293 ->288. We suspect demand ischemia since it is trending down now. Consider lipid panel and ASCVD risk calculator outpatient to see if she will need a statin added to her medication regimen.  Plan:  --Continue carvedilol --Continue ASA  --Consider lipid panel and ASCVD risk calculator outpatient.  Asymptomatic bacteuria. Patient denies symptoms of dysuria, urgency and frequency. Initial CT findings raised concern for ascending UTI. Has remained afebrile and no systemic leukocytosis. Urinalysis showed hazy urine, large Hb on urine dipstick, proteinuria, leukocytosis, and rare bacteria. Patient's preliminary urine culture returned back positive for Klebsiella pneumoniae. Will re-examine patient for further workup.  Plan:  --Await final urine culture --Holding off on treatment since patient is asymptomatic  Enlarged thyroid. Patient has an enlarged firm, goiter on exam. Thyroid U/S showed a 1.5 cm left thyroid nodule. TSH was WNL. Recommend follow up in 1 year. Plan: --Follow up in 1 year   Asymptomatic hyperbilirubinemia. Patient has had elevated bilirubin in past (2.3 on 06/16/2020), on admission her total bilirubin was 2.6. She did have a positive direct coombs test on 04/22/20. LDH from 6 months ago was 348, on admission it was 249. Hepatic function panel showed total bili- 2.4, direct bili- 0.6, indirect bili- 1.8. Appears to be asymptomatic, and at baseline levels. Awaiting haptoglobin. Continue to monitor for symptoms. Plan:  --Haptoglobin pending --Continue monitoring symptoms  Persistent atrial fibrillation. EKG shows atrial fibrillation with PVCs. She is not currently on anticoagulation due to history of GI bleeds. 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 Patient has LLQ tenderness on physical exam, however she reports this to be a chronic problem for her. 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 Her abdominal pain is consistent with prior and only occurs with palpation of left lower quadrant. No further changes have occurred during this hospital course.  Plan:  --Continue following with GI in outpatient setting  T2DM. HbA1c 7.6 (12/31/2019) Plan:  --SSI     LOS: 1 day   Leory Plowman, Medical Student 06/28/2020, 1:08 PM

## 2020-06-29 ENCOUNTER — Other Ambulatory Visit: Payer: Self-pay

## 2020-06-29 LAB — URINE CULTURE: Culture: >=100000 — AB

## 2020-06-29 LAB — COMPREHENSIVE METABOLIC PANEL
ALT: 16 U/L (ref 0–44)
AST: 29 U/L (ref 15–41)
Albumin: 3.5 g/dL (ref 3.5–5.0)
Alkaline Phosphatase: 70 U/L (ref 38–126)
Anion gap: 10 (ref 5–15)
BUN: 18 mg/dL (ref 8–23)
CO2: 32 mmol/L (ref 22–32)
Calcium: 9 mg/dL (ref 8.9–10.3)
Chloride: 98 mmol/L (ref 98–111)
Creatinine, Ser: 1.78 mg/dL — ABNORMAL HIGH (ref 0.44–1.00)
GFR calc Af Amer: 34 mL/min — ABNORMAL LOW (ref >60)
GFR calc non Af Amer: 29 mL/min — ABNORMAL LOW (ref >60)
Glucose, Bld: 137 mg/dL — ABNORMAL HIGH (ref 70–99)
Potassium: 4 mmol/L (ref 3.5–5.1)
Sodium: 140 mmol/L (ref 135–145)
Total Bilirubin: 1.7 mg/dL — ABNORMAL HIGH (ref 0.3–1.2)
Total Protein: 7.5 g/dL (ref 6.5–8.1)

## 2020-06-29 LAB — GLUCOSE, CAPILLARY
Glucose-Capillary: 141 mg/dL — ABNORMAL HIGH (ref 70–99)
Glucose-Capillary: 196 mg/dL — ABNORMAL HIGH (ref 70–99)

## 2020-06-29 LAB — CBC
HCT: 28.6 % — ABNORMAL LOW (ref 36.0–46.0)
Hemoglobin: 8.9 g/dL — ABNORMAL LOW (ref 12.0–15.0)
MCH: 26.9 pg (ref 26.0–34.0)
MCHC: 31.1 g/dL (ref 30.0–36.0)
MCV: 86.4 fL (ref 80.0–100.0)
Platelets: 119 10*3/uL — ABNORMAL LOW (ref 150–400)
RBC: 3.31 MIL/uL — ABNORMAL LOW (ref 3.87–5.11)
RDW: 16.4 % — ABNORMAL HIGH (ref 11.5–15.5)
WBC: 4.5 10*3/uL (ref 4.0–10.5)
nRBC: 0 % (ref 0.0–0.2)

## 2020-06-29 LAB — CYTOLOGY - NON PAP

## 2020-06-29 NOTE — Discharge Summary (Signed)
Name: Rita [name] MRN: [id number] DOB: [birth year] 64 y.o. PCP: [name], MD  Date of Admission: 06/26/2020  1:24 PM Date of Discharge: 06/29/2020 Attending Physician: Velna Ochs, MD  Discharge Diagnosis: 1.Acute on chronic respiratory failure, secondary to HFpEF exacerbation Right transudative pleural effusion secondary to HF Type 2 NSTEMI Asymptomatic bacteruria. Enlarged thyroid  Discharge Medications: Allergies as of 06/29/2020      Reactions   Codeine Other (See Comments)   Mouth sores    Prednisone Other (See Comments)   Delusions    Moxifloxacin Rash      Medication List    TAKE these medications   allopurinol 100 MG tablet Commonly known as: ZYLOPRIM Take 100 mg by mouth 2 (two) times daily.   aspirin EC 81 MG tablet Take 81 mg by mouth daily. Swallow whole.   blood glucose meter kit and supplies Dispense based on patient and insurance preference. Use up to four times daily as directed. (FOR ICD-10 E10.9, E11.9).   carvedilol 25 MG tablet Commonly known as: COREG Take 12.5 mg by mouth 2 (two) times daily with a meal.   ferrous sulfate 325 (65 FE) MG tablet Take 325 mg by mouth 2 (two) times daily with a meal.   furosemide 40 MG tablet Commonly known as: LASIX Take 40 mg by mouth See admin instructions. Take one tablet (40 mg) by mouth every morning, may take a 2nd tablet (40 mg) in the evening (4-5pm) as needed for fluid/swelling   glipiZIDE 5 MG 24 hr tablet Commonly known as: GLUCOTROL XL Take 5 mg by mouth daily with breakfast.   metFORMIN 500 MG 24 hr tablet Commonly known as: GLUCOPHAGE-XR Take 500 mg by mouth daily with breakfast.   OXYGEN Inhale 2 L into the lungs continuous.   pantoprazole 40 MG tablet Commonly known as: PROTONIX Take 40 mg by mouth daily.   potassium chloride SA 20 MEQ tablet Commonly known as: KLOR-CON Take 20 mEq by mouth daily.       Disposition and follow-up:   Rita Diane  Kyrene [name] was discharged from [facility] in Stable condition.  At the hospital follow up visit please address:  1.  Acute on chronic respiratory failure, secondary to HFpEF exacerbation. Initial admission to hospital warranted by reported 6 day history SOB + cough along with O2 sat in the low 80s during visit with PCP on 7/19. Found to be hypervolemic, BNP was at 723, was given IV lasix 40 mg. Patient given ABX for suspected etiology of pneumonia or infection. Patient remained afebrile, showed no leukocytosis, and other diagnostic lab markers were found to be WNL. CXR showed large right-sided pleural effusion with near complete collapse of the RLL. CT confirmed near complete collapse of RLL and RML infiltrate. Discontinued ABX course. IV Lasix increased from 40 to 80 mg QD. Patient underwent diagnostic & therapeutic IR thoracentesis for right sided effusion. Symptoms improved and problem resolved following procedure. Discontinued IV lasix and will restart home oral lasix regimen 7/23. Continue using oxygen 2L [place] as needed. Follow up with your primary care provider in 1 week.    Right transudative pleural effusion secondary to HF. Initially concerned for PNA, however patient remained afebrile without leukocytosis and CXR/CT confirmed right pleural effusion.  Underwent diagnostic & therapeutic IR thoracentesis, labs confirmed a likely transudative pleural effusion secondary to HF via Lights Criteria. Patient's symptoms resolved after diagnostic & therapeutic IR thoracentesis, CXR post-procedure showed significant improvement in aeration of  the right lung base and a much smaller effusion. No further intervention is needed at this time.    Type 2 NSTEMI. Troponin elevated on admission at 285, but patient has denied chest pain. Trended troponin levels and remained flat on subsequent measurements -> 276 -> 295 -> 293 ->288. We believe this to be due to demand ischemia. Consider lipid panel  and ASCVD risk calculator outpatient to see if patient will need a statin added to her medication regimen.   Asymptomatic bacteruria. Patient denied symptoms of dysuria, urgency and frequency during full hospital course. Remained afebrile with no systemic leukocytosis. Initial CT findings raised concern for ascending UTI.  Urinalysis showed hazy urine, large Hb on urine dipstick, proteinuria, leukocytosis, and rare bacteria. Patient's urine culture positive for Klebsiella pneumoniae. Likely of no clinical significance and believe will resolve on its own.    Enlarged thyroid. Enlarged firm, goiter on exam. Thyroid U/S showed a 1.5 cm left thyroid nodule. TSH was WNL. Recommend follow up in 1 year.  2.  Labs / imaging needed at time of follow-up: none  3.  Pending labs/ test needing follow-up: none  Follow-up Appointments:  Follow-up Information    [name], MD. Schedule an appointment as soon as possible for a visit on 07/03/2020.   Specialty: Internal Medicine Why: _0 :30 Contact information: [place] [id number] [place] by problem list: 1. Acute on chronic respiratory failure, secondary to HFpEF exacerbation. Initial admission to hospital warranted by reported 6 day history SOB + cough along with O2 sat in the low 80s during visit with PCP on 7/19. Found to be hypervolemic, BNP was at 723, was given IV lasix 40 mg. Patient given ABX for suspected etiology of pneumonia or infection. Patient remained afebrile, showed no leukocytosis, and other diagnostic lab markers were found to be WNL. CXR showed large right-sided pleural effusion with near complete collapse of the RLL. CT confirmed near complete collapse of RLL and RML infiltrate. Discontinued ABX course. IV Lasix increased from 40 to 80 mg QD. Patient underwent diagnostic & therapeutic IR thoracentesis for right sided effusion. Symptoms improved and problem resolved following  procedure. Discontinued IV lasix and will restart home oral lasix regimen 7/23. Continue using oxygen 2L [place] as needed. Follow up with IMTS clinic in 1 week.    Right transudative pleural effusion secondary to HF. Initially concerned for PNA, however patient remained afebrile without leukocytosis and CXR/CT confirmed right pleural effusion.  Underwent diagnostic & therapeutic IR thoracentesis, labs confirmed a likely transudative pleural effusion secondary to HF via Lights Criteria. Patient's symptoms resolved after diagnostic & therapeutic IR thoracentesis, CXR post-procedure showed significant improvement in aeration of the right lung base and a much smaller effusion. No further intervention is needed at this time.   Type 2 NSTEMI. Troponin elevated on admission at 285, but patient has denied chest pain. Trended troponin levels and remained flat on subsequent measurements -> 276 -> 295 -> 293 ->288. We believe this to be due to demand ischemia. Consider lipid panel and ASCVD risk calculator outpatient to see if patient will need a statin added to her medication regimen.   Asymptomatic bacteruria. Patient denied symptoms of dysuria, urgency and frequency during full hospital course. Remained afebrile with no systemic leukocytosis. Initial CT findings raised concern for ascending UTI.  Urinalysis showed hazy urine, large Hb on urine dipstick, proteinuria, leukocytosis, and rare  bacteria. Patient's urine culture positive for Klebsiella pneumoniae. Likely of no clinical significance and believe will resolve on its own.    Enlarged thyroid. Enlarged firm, goiter on exam. Thyroid U/S showed a 1.5 cm left thyroid nodule. TSH was WNL. Recommend follow up in 1 year.  Discharge Vitals:   BP 123/68 (BP Location: Left Arm)   Pulse 59   Temp 97.8 F (36.6 C) (Oral)   Resp 19   Ht _0  (1.549 m)   Wt 115.9 kg Comment: scale b  SpO2 95%   BMI 48.30 kg/m   Pertinent Labs, Studies, and Procedures:  CBC  Latest Ref Rng & Units 06/29/2020 06/28/2020 06/27/2020  WBC 4.0 - 10.5 K/uL 4.5 4.9 5.3  Hemoglobin 12.0 - 15.0 g/dL 8.9(L) 8.8(L) 8.0(L)  Hematocrit 36 - 46 % 28.6(L) 28.2(L) 26.1(L)  Platelets 150 - 400 K/uL 119(L) 116(L) 111(L)   BMP Latest Ref Rng & Units 06/29/2020 06/28/2020 06/27/2020  Glucose 70 - 99 mg/dL 137(H) 122(H) 171(H)  BUN 8 - 23 mg/dL _1 Creatinine 0.44 - 1.00 mg/dL 1.78(H) 1.39(H) 1.20(H)  Sodium 135 - 145 mmol/L 140 140 142  Potassium 3.5 - 5.1 mmol/L 4.0 4.1 3.0(L)  Chloride 98 - 111 mmol/L 98 98 102  CO2 22 - 32 mmol/L 32 32 30  Calcium 8.9 - 10.3 mg/dL 9.0 9.0 8.6(L)   Urine Culture: >100,000 colonies of Klebsiella Pneumoniae  CT Abdomen/Pelvis: No acute pulmonary embolism. Large right-sided pleural effusion with near complete collapse of the right lower lobe. Small left-sided pleural effusion Airspace opacity in the right middle lobe concerning for a developing infiltrate. Mosaic appearance of the lung parenchyma, which can be seen in patients with small airways disease or chronic pulmonary hypertension. The main pulmonary artery is dilated suggestive of elevated pulmonary artery pressures. Cardiomegaly. New fat stranding about the left ureter with a small amount of new retroperitoneal free fluid. Findings are concerning for an ascending urinary tract infection and should be correlated with urinalysis. Hepatic steatosis  IR thoracentesis: Successful ultrasound guided right thoracentesis yielding 650 mL of pleural fluid.  Discharge Instructions: Discharge Instructions    (HEART FAILURE PATIENTS) Call MD:  Anytime you have any of the following symptoms: 1) 3 pound weight gain in 24 hours or 5 pounds in 1 week 2) shortness of breath, with or without a dry hacking cough 3) swelling in the hands, feet or stomach 4) if you have to sleep on extra pillows at night in order to breathe.   Complete by: As directed    Call MD for:   Complete by: As directed    Call MD for:   difficulty breathing, headache or visual disturbances   Complete by: As directed    Call MD for:  extreme fatigue   Complete by: As directed    Call MD for:  hives   Complete by: As directed    Call MD for:  persistant dizziness or light-headedness   Complete by: As directed    Call MD for:  persistant nausea and vomiting   Complete by: As directed    Call MD for:  redness, tenderness, or signs of infection (pain, swelling, redness, odor or green/yellow discharge around incision site)   Complete by: As directed    Call MD for:  severe uncontrolled pain   Complete by: As directed    Call MD for:  temperature >100.4   Complete by: As directed    Diet - low sodium heart healthy  Complete by: As directed    Discharge instructions   Complete by: As directed    Rita [name], It was a pleasure to take care of you during your hospitalization. You had lung fluid drawn off. This has greatly helped your breathing. It is important to continue eating and drinking better. Please resume your home medications. Please make an appointment with your primary care provider in the next week.   Increase activity slowly   Complete by: As directed       Signed: Briant Cedar, MD 06/29/2020, 3:01 PM   [phone]

## 2020-06-29 NOTE — Progress Notes (Addendum)
Subjective: Patient was markedly improved from yesterday. Appeared well and sitting comfortably in her chair. Patient states she feels brand new and has no more SOB or cough. Says that the procedure went well for IR thoracentesis. Patient was very thankful. Patient denied any pain in urine, changes in urinary frequency, or blood in urine. Patient said that the urine is a little cloudy but that it is getting better.   Objective:  Vital signs in last 24 hours: Vitals:   06/28/20 1235 06/28/20 2106 06/29/20 0036 06/29/20 0621  BP: 130/70 118/82  (!) 103/58  Pulse: (!) 59 64  (!) 55  Resp: 20 20  20   Temp: 98.3 F (36.8 C) 98.3 F (36.8 C)  98.2 F (36.8 C)  TempSrc: Oral Oral  Oral  SpO2: 100% 100%  95%  Weight:   115.9 kg   Height:       Weight change: 0.091 kg  Intake/Output Summary (Last 24 hours) at 06/29/2020 1031 Last data filed at 06/29/2020 0055 Gross per 24 hour  Intake 600 ml  Output 800 ml  Net -200 ml   Physical Exam: GEN: NAD, lying in bed. Patient is obese.  CV: Normal rate, irregular rhythm. No murmurs, rubs, or gallops PULM: Breathing sounds have much improved from yesterday, slight crackles appreciated but that appears to be baseline.  Patient is on 2L [place].  ABD: Soft, NT/ND, +BS. Tenderness to palpation in LLQ, however this is baseline.  EXT: 1+ pitting edema to her mid shins bilaterally   Assessment/Plan: This is day 2 for 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   Active Problems:   Acute on chronic diastolic congestive heart failure (HCC)   Acute on  chronic respiratory failure (HCC)   Heart failure (HCC)   Pleural effusion RESOLVED: Acute on chronic respiratory failure, secondary to HFpEF exacerbation. Patient has a history of this and requires 2L O2 at home, on 7/19 on visit with PCP she desaturated to 80%, along with reported SOB + cough for 6 days. Later admitted to hospital, and desatted to low 80s, along with observed mild hypervolemia. BNP was 723, which was mildly elevated from 06/16/2020- 675.6. Remained afebrile, had no leukocytosis and diagnostic lab markers returned back WNL so discontinued ceftriaxone and azithromycin given that we had suspicion for infection or possible pneumonia.  CXR showed large right-sided pleural effusion with near complete collapse of the RLL. CT showed near complete collapse of RLL and RML infiltrate. Patient underwent diagnostic & therapeutic IR thoracentesis and her symptoms have resolved and stated that she feels brand new today.  Patient received 80 mg IV lasix yesterday, we observed minimal changes in body fluid level, however this affected patient's kidney given her Cr value from the [place]. Will d/c IV lasix and restart oral lasix regimen tomorrow. Patient is medically stable and currently pending discharge.  Plan:  --D/C IV lasix 80 mg --D/C strict I/O --D/C monitor daily weights --Restart oral lasix regimen tomorrow at home (7/23)   --Continue oxygen 2L [place] as needed   RESOLVED: Right transudative pleural effusion secondary to HF. Initially concerned for PNA, however patient has remained afebrile without  leukocytosis and recent procalcitonin was undetectable.  Patient recently underwent diagnostic & therapeutic IR thoracentesis, labs confirmed a likely transudative process using Lights Criteria. This is the result of patient's HF, the first marker of the criteria was not a classic fit, however patient's baseline LDH has always been relatively high.   239 > 2/3 upper limit of nml LDH (200) Total protein  (effusion)/total protein (serum) < 0.5 ===>3.6/7.3=0.493===> 0.493<0.5  LDH effusion /LDH serum < 0.6 ===> 92/239=0.38 ====> 0.38<0.6 Patient's symptoms resolved after diagnostic & therapeutic IR thoracentesis, CXR post-procedure showed significant improvement in aeration of the right lung base and a much smaller effusion. No further intervention is needed at this time.  Plan:  --Pending cytology, non PAP   Type 2 NSTEMI. Prior hospitalization 06/16/20, troponin of 314 on presentation. Patient has denied chest pain, but troponin levels were concerning. Troponin was elevated on admission at 285, but it was remained flat on subsequent measurements ->276 ->295 -> 293 ->288. We suspect demand ischemia since it is trending down now. Consider lipid panel and ASCVD risk calculator outpatient to see if she will need a statin added to her medication regimen.  Plan:  --Continue carvedilol --Continue ASA  --Consider lipid panel and ASCVD risk calculator outpatient  Asymptomatic bacteruria. Patient denies symptoms of dysuria, urgency and frequency. Initial CT findings raised concern for ascending UTI. Has remained afebrile and no systemic leukocytosis. Urinalysis showed hazy urine, large Hb on urine dipstick, proteinuria, leukocytosis, and rare bacteria. Patient's preliminary urine culture returned back positive for Klebsiella pneumoniae. Patient has continued to deny any recent urinary symptoms, likely of no clinical significance for team and this will resolve on its own.   Enlarged thyroid. Patient has an enlarged firm, goiter on exam. Thyroid U/S showed a 1.5 cm left thyroid nodule. TSH was WNL. Recommend follow up in 1 year. Plan: --Follow up in 1 year   Asymptomatic hyperbilirubinemia. Patient has had elevated bilirubin in past (2.3 on 06/16/2020),on admission hertotal bilirubin was2.6. She did have a positive direct coombs test on 04/22/20. LDH from 6 months ago was 348, on admission it was 249  and recently returned at 239. Hepatic function panelshowed total bili- 2.4, direct bili- 0.6, indirect bili- 1.8. Recent total bili is improved at 1.7. Appears to be asymptomatic, and at baseline levels. Haptoglobin WNL (78).  No further workup is needed at this time.   Persistent atrial fibrillation. EKG shows atrial fibrillation with PVCs. She is not currently on anticoagulation due to history of GI bleeds. 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 Patient has LLQ tenderness on physical exam, however she reports this to be a chronic problem for her. 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 Her abdominal pain is consistent with prior and only occurs with palpation of left lower quadrant. No further changes have occurred during this hospital course.  Plan:  --Continue following with GI in outpatient setting  T2DM. HbA1c 7.6 (12/31/2019) Plan:  --SSI   LOS: 2 days   Leory Plowman, Medical Student 06/29/2020, 10:31 AM

## 2020-06-29 NOTE — Hospital Course (Signed)
  ----------------------------------------------------------------------------------   Hospital course by list: RESOLVED: Acute on chronic respiratory failure, secondary to HFpEF exacerbation. Initial admission to hospital warranted by reported 6 day history SOB + cough along with O2 sat in the low 80s during visit with PCP on 7/19. Found to be hypervolemic, BNP was at 723, was given IV lasix 40 mg. Patient given ABX for suspected etiology of pneumonia or infection. Patient remained afebrile, showed no leukocytosis, and other diagnostic lab markers were found to be WNL. CXR showed large right-sided pleural effusion with near complete collapse of the RLL. CT confirmed near complete collapse of RLL and RML infiltrate. Discontinued ABX course. IV Lasix increased from 40 to 80 mg QD. Patient underwent diagnostic & therapeutic IR thoracentesis for right sided effusion. Symptoms improved and problem resolved following procedure. Discontinued IV lasix and will restart home oral lasix regimen 7/23. Continue using oxygen 2L [place] as needed. Follow up with IMTS clinic in 1 week.    RESOLVED: Right transudative pleural effusion secondary to HF. Initially concerned for PNA, however patient remained afebrile without leukocytosis and CXR/CT confirmed right pleural effusion.  Underwent diagnostic & therapeutic IR thoracentesis, labs confirmed a likely transudative pleural effusion secondary to HF via Lights Criteria. Patient's symptoms resolved after diagnostic & therapeutic IR thoracentesis, CXR post-procedure showed significant improvement in aeration of the right lung base and a much smaller effusion. No further intervention is needed at this time.   Type 2 NSTEMI. Troponin elevated on admission at 285, but patient has denied chest pain. Trended troponin levels and remained flat on subsequent measurements -> 276 -> 295 -> 293 ->288. We believe this to be due to demand ischemia. Consider lipid panel and ASCVD risk  calculator outpatient to see if patient will need a statin added to her medication regimen.   Asymptomatic bacteruria. Patient denied symptoms of dysuria, urgency and frequency during full hospital course. Remained afebrile with no systemic leukocytosis. Initial CT findings raised concern for ascending UTI.  Urinalysis showed hazy urine, large Hb on urine dipstick, proteinuria, leukocytosis, and rare bacteria. Patient's urine culture positive for Klebsiella pneumoniae. Likely of no clinical significance and believe will resolve on its own.    Enlarged thyroid. Enlarged firm, goiter on exam. Thyroid U/S showed a 1.5 cm left thyroid nodule. TSH was WNL. Recommend follow up in 1 year.  Asymptomatic hyperbilirubinemia. Patient had slightly elevated bilirubin levels, however appeared to be at baseline levels. Other labs were WNL. No further workup was needed during stay.    Persistent atrial fibrillation. No changes were made during the hospital course.    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 No changes were made during the hospital course.

## 2020-06-30 LAB — BODY FLUID CULTURE

## 2020-07-02 DIAGNOSIS — I4819 Other persistent atrial fibrillation: Secondary | ICD-10-CM | POA: Diagnosis not present

## 2020-07-02 DIAGNOSIS — R32 Unspecified urinary incontinence: Secondary | ICD-10-CM | POA: Diagnosis not present

## 2020-07-02 DIAGNOSIS — Z8616 Personal history of COVID-19: Secondary | ICD-10-CM | POA: Diagnosis not present

## 2020-07-02 DIAGNOSIS — I5032 Chronic diastolic (congestive) heart failure: Secondary | ICD-10-CM | POA: Diagnosis not present

## 2020-07-02 DIAGNOSIS — I252 Old myocardial infarction: Secondary | ICD-10-CM | POA: Diagnosis not present

## 2020-07-02 DIAGNOSIS — Z9181 History of falling: Secondary | ICD-10-CM | POA: Diagnosis not present

## 2020-07-02 DIAGNOSIS — Z9981 Dependence on supplemental oxygen: Secondary | ICD-10-CM | POA: Diagnosis not present

## 2020-07-02 DIAGNOSIS — Z7984 Long term (current) use of oral hypoglycemic drugs: Secondary | ICD-10-CM | POA: Diagnosis not present

## 2020-07-02 DIAGNOSIS — J9611 Chronic respiratory failure with hypoxia: Secondary | ICD-10-CM | POA: Diagnosis not present

## 2020-07-02 DIAGNOSIS — E1122 Type 2 diabetes mellitus with diabetic chronic kidney disease: Secondary | ICD-10-CM | POA: Diagnosis not present

## 2020-07-02 DIAGNOSIS — N183 Chronic kidney disease, stage 3 unspecified: Secondary | ICD-10-CM | POA: Diagnosis not present

## 2020-07-02 DIAGNOSIS — Z86711 Personal history of pulmonary embolism: Secondary | ICD-10-CM | POA: Diagnosis not present

## 2020-07-02 DIAGNOSIS — Z7982 Long term (current) use of aspirin: Secondary | ICD-10-CM | POA: Diagnosis not present

## 2020-07-02 DIAGNOSIS — I130 Hypertensive heart and chronic kidney disease with heart failure and stage 1 through stage 4 chronic kidney disease, or unspecified chronic kidney disease: Secondary | ICD-10-CM | POA: Diagnosis not present

## 2020-07-02 DIAGNOSIS — Z6841 Body Mass Index (BMI) 40.0 and over, adult: Secondary | ICD-10-CM | POA: Diagnosis not present

## 2020-07-02 DIAGNOSIS — E049 Nontoxic goiter, unspecified: Secondary | ICD-10-CM | POA: Diagnosis not present

## 2020-07-02 DIAGNOSIS — I272 Pulmonary hypertension, unspecified: Secondary | ICD-10-CM | POA: Diagnosis not present

## 2020-07-03 LAB — CULTURE, BODY FLUID W GRAM STAIN -BOTTLE: Culture: NO GROWTH

## 2020-07-05 DIAGNOSIS — R32 Unspecified urinary incontinence: Secondary | ICD-10-CM | POA: Diagnosis not present

## 2020-07-05 DIAGNOSIS — Z6841 Body Mass Index (BMI) 40.0 and over, adult: Secondary | ICD-10-CM | POA: Diagnosis not present

## 2020-07-05 DIAGNOSIS — E1122 Type 2 diabetes mellitus with diabetic chronic kidney disease: Secondary | ICD-10-CM | POA: Diagnosis not present

## 2020-07-05 DIAGNOSIS — I5032 Chronic diastolic (congestive) heart failure: Secondary | ICD-10-CM | POA: Diagnosis not present

## 2020-07-05 DIAGNOSIS — Z7982 Long term (current) use of aspirin: Secondary | ICD-10-CM | POA: Diagnosis not present

## 2020-07-05 DIAGNOSIS — Z7984 Long term (current) use of oral hypoglycemic drugs: Secondary | ICD-10-CM | POA: Diagnosis not present

## 2020-07-05 DIAGNOSIS — Z9181 History of falling: Secondary | ICD-10-CM | POA: Diagnosis not present

## 2020-07-05 DIAGNOSIS — Z86711 Personal history of pulmonary embolism: Secondary | ICD-10-CM | POA: Diagnosis not present

## 2020-07-05 DIAGNOSIS — N183 Chronic kidney disease, stage 3 unspecified: Secondary | ICD-10-CM | POA: Diagnosis not present

## 2020-07-05 DIAGNOSIS — J9611 Chronic respiratory failure with hypoxia: Secondary | ICD-10-CM | POA: Diagnosis not present

## 2020-07-05 DIAGNOSIS — Z8616 Personal history of COVID-19: Secondary | ICD-10-CM | POA: Diagnosis not present

## 2020-07-05 DIAGNOSIS — Z9981 Dependence on supplemental oxygen: Secondary | ICD-10-CM | POA: Diagnosis not present

## 2020-07-05 DIAGNOSIS — I4819 Other persistent atrial fibrillation: Secondary | ICD-10-CM | POA: Diagnosis not present

## 2020-07-05 DIAGNOSIS — I130 Hypertensive heart and chronic kidney disease with heart failure and stage 1 through stage 4 chronic kidney disease, or unspecified chronic kidney disease: Secondary | ICD-10-CM | POA: Diagnosis not present

## 2020-07-05 DIAGNOSIS — I272 Pulmonary hypertension, unspecified: Secondary | ICD-10-CM | POA: Diagnosis not present

## 2020-07-05 DIAGNOSIS — E049 Nontoxic goiter, unspecified: Secondary | ICD-10-CM | POA: Diagnosis not present

## 2020-07-05 DIAGNOSIS — I252 Old myocardial infarction: Secondary | ICD-10-CM | POA: Diagnosis not present

## 2020-07-06 DIAGNOSIS — D500 Iron deficiency anemia secondary to blood loss (chronic): Secondary | ICD-10-CM | POA: Diagnosis not present

## 2020-07-06 DIAGNOSIS — Z8709 Personal history of other diseases of the respiratory system: Secondary | ICD-10-CM | POA: Diagnosis not present

## 2020-07-06 DIAGNOSIS — Z09 Encounter for follow-up examination after completed treatment for conditions other than malignant neoplasm: Secondary | ICD-10-CM | POA: Diagnosis not present

## 2020-07-06 DIAGNOSIS — K921 Melena: Secondary | ICD-10-CM | POA: Diagnosis not present

## 2020-07-06 DIAGNOSIS — R109 Unspecified abdominal pain: Secondary | ICD-10-CM | POA: Diagnosis not present

## 2020-07-06 DIAGNOSIS — I5032 Chronic diastolic (congestive) heart failure: Secondary | ICD-10-CM | POA: Diagnosis not present

## 2020-07-10 DIAGNOSIS — Z9981 Dependence on supplemental oxygen: Secondary | ICD-10-CM | POA: Diagnosis not present

## 2020-07-10 DIAGNOSIS — Z8616 Personal history of COVID-19: Secondary | ICD-10-CM | POA: Diagnosis not present

## 2020-07-10 DIAGNOSIS — R32 Unspecified urinary incontinence: Secondary | ICD-10-CM | POA: Diagnosis not present

## 2020-07-10 DIAGNOSIS — Z6841 Body Mass Index (BMI) 40.0 and over, adult: Secondary | ICD-10-CM | POA: Diagnosis not present

## 2020-07-10 DIAGNOSIS — I272 Pulmonary hypertension, unspecified: Secondary | ICD-10-CM | POA: Diagnosis not present

## 2020-07-10 DIAGNOSIS — Z9181 History of falling: Secondary | ICD-10-CM | POA: Diagnosis not present

## 2020-07-10 DIAGNOSIS — I4819 Other persistent atrial fibrillation: Secondary | ICD-10-CM | POA: Diagnosis not present

## 2020-07-10 DIAGNOSIS — E049 Nontoxic goiter, unspecified: Secondary | ICD-10-CM | POA: Diagnosis not present

## 2020-07-10 DIAGNOSIS — I5032 Chronic diastolic (congestive) heart failure: Secondary | ICD-10-CM | POA: Diagnosis not present

## 2020-07-10 DIAGNOSIS — E1122 Type 2 diabetes mellitus with diabetic chronic kidney disease: Secondary | ICD-10-CM | POA: Diagnosis not present

## 2020-07-10 DIAGNOSIS — J9611 Chronic respiratory failure with hypoxia: Secondary | ICD-10-CM | POA: Diagnosis not present

## 2020-07-10 DIAGNOSIS — Z7984 Long term (current) use of oral hypoglycemic drugs: Secondary | ICD-10-CM | POA: Diagnosis not present

## 2020-07-10 DIAGNOSIS — N183 Chronic kidney disease, stage 3 unspecified: Secondary | ICD-10-CM | POA: Diagnosis not present

## 2020-07-10 DIAGNOSIS — Z86711 Personal history of pulmonary embolism: Secondary | ICD-10-CM | POA: Diagnosis not present

## 2020-07-10 DIAGNOSIS — I252 Old myocardial infarction: Secondary | ICD-10-CM | POA: Diagnosis not present

## 2020-07-10 DIAGNOSIS — I130 Hypertensive heart and chronic kidney disease with heart failure and stage 1 through stage 4 chronic kidney disease, or unspecified chronic kidney disease: Secondary | ICD-10-CM | POA: Diagnosis not present

## 2020-07-10 DIAGNOSIS — Z7982 Long term (current) use of aspirin: Secondary | ICD-10-CM | POA: Diagnosis not present

## 2020-07-12 DIAGNOSIS — I509 Heart failure, unspecified: Secondary | ICD-10-CM | POA: Diagnosis not present

## 2020-07-12 DIAGNOSIS — Z7982 Long term (current) use of aspirin: Secondary | ICD-10-CM | POA: Diagnosis not present

## 2020-07-12 DIAGNOSIS — Z8616 Personal history of COVID-19: Secondary | ICD-10-CM | POA: Diagnosis not present

## 2020-07-12 DIAGNOSIS — Z9181 History of falling: Secondary | ICD-10-CM | POA: Diagnosis not present

## 2020-07-12 DIAGNOSIS — Z9981 Dependence on supplemental oxygen: Secondary | ICD-10-CM | POA: Diagnosis not present

## 2020-07-12 DIAGNOSIS — Z7984 Long term (current) use of oral hypoglycemic drugs: Secondary | ICD-10-CM | POA: Diagnosis not present

## 2020-07-12 DIAGNOSIS — I272 Pulmonary hypertension, unspecified: Secondary | ICD-10-CM | POA: Diagnosis not present

## 2020-07-12 DIAGNOSIS — R32 Unspecified urinary incontinence: Secondary | ICD-10-CM | POA: Diagnosis not present

## 2020-07-12 DIAGNOSIS — I4819 Other persistent atrial fibrillation: Secondary | ICD-10-CM | POA: Diagnosis not present

## 2020-07-12 DIAGNOSIS — E1122 Type 2 diabetes mellitus with diabetic chronic kidney disease: Secondary | ICD-10-CM | POA: Diagnosis not present

## 2020-07-12 DIAGNOSIS — I130 Hypertensive heart and chronic kidney disease with heart failure and stage 1 through stage 4 chronic kidney disease, or unspecified chronic kidney disease: Secondary | ICD-10-CM | POA: Diagnosis not present

## 2020-07-12 DIAGNOSIS — I252 Old myocardial infarction: Secondary | ICD-10-CM | POA: Diagnosis not present

## 2020-07-12 DIAGNOSIS — J9611 Chronic respiratory failure with hypoxia: Secondary | ICD-10-CM | POA: Diagnosis not present

## 2020-07-12 DIAGNOSIS — Z6841 Body Mass Index (BMI) 40.0 and over, adult: Secondary | ICD-10-CM | POA: Diagnosis not present

## 2020-07-12 DIAGNOSIS — E049 Nontoxic goiter, unspecified: Secondary | ICD-10-CM | POA: Diagnosis not present

## 2020-07-12 DIAGNOSIS — Z86711 Personal history of pulmonary embolism: Secondary | ICD-10-CM | POA: Diagnosis not present

## 2020-07-12 DIAGNOSIS — N183 Chronic kidney disease, stage 3 unspecified: Secondary | ICD-10-CM | POA: Diagnosis not present

## 2020-07-12 DIAGNOSIS — I5032 Chronic diastolic (congestive) heart failure: Secondary | ICD-10-CM | POA: Diagnosis not present

## 2020-07-17 DIAGNOSIS — Z6841 Body Mass Index (BMI) 40.0 and over, adult: Secondary | ICD-10-CM | POA: Diagnosis not present

## 2020-07-17 DIAGNOSIS — I272 Pulmonary hypertension, unspecified: Secondary | ICD-10-CM | POA: Diagnosis not present

## 2020-07-17 DIAGNOSIS — Z7984 Long term (current) use of oral hypoglycemic drugs: Secondary | ICD-10-CM | POA: Diagnosis not present

## 2020-07-17 DIAGNOSIS — E049 Nontoxic goiter, unspecified: Secondary | ICD-10-CM | POA: Diagnosis not present

## 2020-07-17 DIAGNOSIS — I130 Hypertensive heart and chronic kidney disease with heart failure and stage 1 through stage 4 chronic kidney disease, or unspecified chronic kidney disease: Secondary | ICD-10-CM | POA: Diagnosis not present

## 2020-07-17 DIAGNOSIS — Z7982 Long term (current) use of aspirin: Secondary | ICD-10-CM | POA: Diagnosis not present

## 2020-07-17 DIAGNOSIS — Z8616 Personal history of COVID-19: Secondary | ICD-10-CM | POA: Diagnosis not present

## 2020-07-17 DIAGNOSIS — E1122 Type 2 diabetes mellitus with diabetic chronic kidney disease: Secondary | ICD-10-CM | POA: Diagnosis not present

## 2020-07-17 DIAGNOSIS — N183 Chronic kidney disease, stage 3 unspecified: Secondary | ICD-10-CM | POA: Diagnosis not present

## 2020-07-17 DIAGNOSIS — I5032 Chronic diastolic (congestive) heart failure: Secondary | ICD-10-CM | POA: Diagnosis not present

## 2020-07-17 DIAGNOSIS — R32 Unspecified urinary incontinence: Secondary | ICD-10-CM | POA: Diagnosis not present

## 2020-07-17 DIAGNOSIS — Z86711 Personal history of pulmonary embolism: Secondary | ICD-10-CM | POA: Diagnosis not present

## 2020-07-17 DIAGNOSIS — I252 Old myocardial infarction: Secondary | ICD-10-CM | POA: Diagnosis not present

## 2020-07-17 DIAGNOSIS — Z9981 Dependence on supplemental oxygen: Secondary | ICD-10-CM | POA: Diagnosis not present

## 2020-07-17 DIAGNOSIS — I4819 Other persistent atrial fibrillation: Secondary | ICD-10-CM | POA: Diagnosis not present

## 2020-07-17 DIAGNOSIS — Z9181 History of falling: Secondary | ICD-10-CM | POA: Diagnosis not present

## 2020-07-17 DIAGNOSIS — J9611 Chronic respiratory failure with hypoxia: Secondary | ICD-10-CM | POA: Diagnosis not present

## 2020-07-18 DIAGNOSIS — D500 Iron deficiency anemia secondary to blood loss (chronic): Secondary | ICD-10-CM | POA: Diagnosis not present

## 2020-07-19 ENCOUNTER — Inpatient Hospital Stay (HOSPITAL_COMMUNITY)
Admission: EM | Admit: 2020-07-19 | Discharge: 2020-07-26 | DRG: 291 | Disposition: A | Discharge status: Home Health | Payer: PPO | Source: Ambulatory Visit | Attending: Internal Medicine | Admitting: Internal Medicine

## 2020-07-19 ENCOUNTER — Emergency Department (HOSPITAL_COMMUNITY): Payer: PPO

## 2020-07-19 ENCOUNTER — Encounter (HOSPITAL_COMMUNITY): Payer: Self-pay | Admitting: *Deleted

## 2020-07-19 ENCOUNTER — Other Ambulatory Visit: Payer: Self-pay

## 2020-07-19 DIAGNOSIS — I4819 Other persistent atrial fibrillation: Secondary | ICD-10-CM | POA: Diagnosis present

## 2020-07-19 DIAGNOSIS — D509 Iron deficiency anemia, unspecified: Secondary | ICD-10-CM | POA: Diagnosis not present

## 2020-07-19 DIAGNOSIS — Z20822 Contact with and (suspected) exposure to covid-19: Secondary | ICD-10-CM | POA: Diagnosis present

## 2020-07-19 DIAGNOSIS — M170 Bilateral primary osteoarthritis of knee: Secondary | ICD-10-CM | POA: Diagnosis present

## 2020-07-19 DIAGNOSIS — I5043 Acute on chronic combined systolic (congestive) and diastolic (congestive) heart failure: Secondary | ICD-10-CM | POA: Diagnosis present

## 2020-07-19 DIAGNOSIS — Z888 Allergy status to other drugs, medicaments and biological substances status: Secondary | ICD-10-CM

## 2020-07-19 DIAGNOSIS — I272 Pulmonary hypertension, unspecified: Secondary | ICD-10-CM | POA: Diagnosis present

## 2020-07-19 DIAGNOSIS — J9811 Atelectasis: Secondary | ICD-10-CM | POA: Diagnosis not present

## 2020-07-19 DIAGNOSIS — I447 Left bundle-branch block, unspecified: Secondary | ICD-10-CM | POA: Diagnosis not present

## 2020-07-19 DIAGNOSIS — Z853 Personal history of malignant neoplasm of breast: Secondary | ICD-10-CM | POA: Diagnosis not present

## 2020-07-19 DIAGNOSIS — D696 Thrombocytopenia, unspecified: Secondary | ICD-10-CM | POA: Diagnosis not present

## 2020-07-19 DIAGNOSIS — Z9221 Personal history of antineoplastic chemotherapy: Secondary | ICD-10-CM

## 2020-07-19 DIAGNOSIS — I130 Hypertensive heart and chronic kidney disease with heart failure and stage 1 through stage 4 chronic kidney disease, or unspecified chronic kidney disease: Principal | ICD-10-CM | POA: Diagnosis present

## 2020-07-19 DIAGNOSIS — K5520 Angiodysplasia of colon without hemorrhage: Secondary | ICD-10-CM | POA: Diagnosis not present

## 2020-07-19 DIAGNOSIS — Z9981 Dependence on supplemental oxygen: Secondary | ICD-10-CM | POA: Diagnosis not present

## 2020-07-19 DIAGNOSIS — E119 Type 2 diabetes mellitus without complications: Secondary | ICD-10-CM

## 2020-07-19 DIAGNOSIS — N1832 Chronic kidney disease, stage 3b: Secondary | ICD-10-CM | POA: Diagnosis not present

## 2020-07-19 DIAGNOSIS — R609 Edema, unspecified: Secondary | ICD-10-CM | POA: Diagnosis not present

## 2020-07-19 DIAGNOSIS — I482 Chronic atrial fibrillation, unspecified: Secondary | ICD-10-CM | POA: Diagnosis present

## 2020-07-19 DIAGNOSIS — R0602 Shortness of breath: Secondary | ICD-10-CM | POA: Diagnosis not present

## 2020-07-19 DIAGNOSIS — Z7984 Long term (current) use of oral hypoglycemic drugs: Secondary | ICD-10-CM

## 2020-07-19 DIAGNOSIS — Z923 Personal history of irradiation: Secondary | ICD-10-CM

## 2020-07-19 DIAGNOSIS — Z7982 Long term (current) use of aspirin: Secondary | ICD-10-CM | POA: Diagnosis not present

## 2020-07-19 DIAGNOSIS — Z79899 Other long term (current) drug therapy: Secondary | ICD-10-CM | POA: Diagnosis not present

## 2020-07-19 DIAGNOSIS — E1165 Type 2 diabetes mellitus with hyperglycemia: Secondary | ICD-10-CM | POA: Diagnosis present

## 2020-07-19 DIAGNOSIS — R109 Unspecified abdominal pain: Secondary | ICD-10-CM | POA: Diagnosis not present

## 2020-07-19 DIAGNOSIS — Q600 Renal agenesis, unilateral: Secondary | ICD-10-CM | POA: Diagnosis not present

## 2020-07-19 DIAGNOSIS — E1122 Type 2 diabetes mellitus with diabetic chronic kidney disease: Secondary | ICD-10-CM | POA: Diagnosis present

## 2020-07-19 DIAGNOSIS — J811 Chronic pulmonary edema: Secondary | ICD-10-CM | POA: Diagnosis not present

## 2020-07-19 DIAGNOSIS — I868 Varicose veins of other specified sites: Secondary | ICD-10-CM | POA: Diagnosis not present

## 2020-07-19 DIAGNOSIS — I5031 Acute diastolic (congestive) heart failure: Secondary | ICD-10-CM | POA: Diagnosis not present

## 2020-07-19 DIAGNOSIS — D649 Anemia, unspecified: Secondary | ICD-10-CM | POA: Diagnosis present

## 2020-07-19 DIAGNOSIS — R600 Localized edema: Secondary | ICD-10-CM | POA: Diagnosis not present

## 2020-07-19 DIAGNOSIS — Z87891 Personal history of nicotine dependence: Secondary | ICD-10-CM

## 2020-07-19 DIAGNOSIS — J90 Pleural effusion, not elsewhere classified: Secondary | ICD-10-CM | POA: Diagnosis not present

## 2020-07-19 DIAGNOSIS — J9601 Acute respiratory failure with hypoxia: Secondary | ICD-10-CM | POA: Diagnosis present

## 2020-07-19 DIAGNOSIS — D631 Anemia in chronic kidney disease: Secondary | ICD-10-CM | POA: Diagnosis present

## 2020-07-19 DIAGNOSIS — N189 Chronic kidney disease, unspecified: Secondary | ICD-10-CM | POA: Diagnosis present

## 2020-07-19 DIAGNOSIS — K828 Other specified diseases of gallbladder: Secondary | ICD-10-CM | POA: Diagnosis not present

## 2020-07-19 DIAGNOSIS — J9621 Acute and chronic respiratory failure with hypoxia: Secondary | ICD-10-CM | POA: Diagnosis not present

## 2020-07-19 DIAGNOSIS — R0603 Acute respiratory distress: Secondary | ICD-10-CM | POA: Diagnosis not present

## 2020-07-19 DIAGNOSIS — Z6841 Body Mass Index (BMI) 40.0 and over, adult: Secondary | ICD-10-CM

## 2020-07-19 DIAGNOSIS — I480 Paroxysmal atrial fibrillation: Secondary | ICD-10-CM | POA: Diagnosis not present

## 2020-07-19 DIAGNOSIS — M7989 Other specified soft tissue disorders: Secondary | ICD-10-CM | POA: Diagnosis not present

## 2020-07-19 DIAGNOSIS — R5381 Other malaise: Secondary | ICD-10-CM | POA: Diagnosis not present

## 2020-07-19 DIAGNOSIS — Z881 Allergy status to other antibiotic agents status: Secondary | ICD-10-CM

## 2020-07-19 DIAGNOSIS — Z885 Allergy status to narcotic agent status: Secondary | ICD-10-CM

## 2020-07-19 DIAGNOSIS — M109 Gout, unspecified: Secondary | ICD-10-CM | POA: Diagnosis present

## 2020-07-19 DIAGNOSIS — R531 Weakness: Secondary | ICD-10-CM | POA: Diagnosis not present

## 2020-07-19 DIAGNOSIS — R069 Unspecified abnormalities of breathing: Secondary | ICD-10-CM | POA: Diagnosis not present

## 2020-07-19 DIAGNOSIS — R5382 Chronic fatigue, unspecified: Secondary | ICD-10-CM | POA: Diagnosis not present

## 2020-07-19 DIAGNOSIS — I517 Cardiomegaly: Secondary | ICD-10-CM | POA: Diagnosis not present

## 2020-07-19 DIAGNOSIS — N183 Chronic kidney disease, stage 3 unspecified: Secondary | ICD-10-CM | POA: Diagnosis present

## 2020-07-19 DIAGNOSIS — I509 Heart failure, unspecified: Secondary | ICD-10-CM | POA: Diagnosis not present

## 2020-07-19 HISTORY — DX: Gastrointestinal hemorrhage, unspecified: K92.2

## 2020-07-19 HISTORY — DX: Chronic kidney disease, unspecified: N18.9

## 2020-07-19 HISTORY — DX: Gout, unspecified: M10.9

## 2020-07-19 HISTORY — DX: Thrombocytopenia, unspecified: D69.6

## 2020-07-19 HISTORY — DX: Anemia, unspecified: D64.9

## 2020-07-19 HISTORY — DX: Chronic kidney disease, stage 3 unspecified: N18.30

## 2020-07-19 HISTORY — DX: Unspecified osteoarthritis, unspecified site: M19.90

## 2020-07-19 HISTORY — DX: COVID-19: U07.1

## 2020-07-19 LAB — I-STAT VENOUS BLOOD GAS, ED
Acid-Base Excess: 8 mmol/L — ABNORMAL HIGH (ref 0.0–2.0)
Bicarbonate: 32.8 mmol/L — ABNORMAL HIGH (ref 20.0–28.0)
Calcium, Ion: 1.07 mmol/L — ABNORMAL LOW (ref 1.15–1.40)
HCT: 33 % — ABNORMAL LOW (ref 36.0–46.0)
Hemoglobin: 11.2 g/dL — ABNORMAL LOW (ref 12.0–15.0)
O2 Saturation: 78 %
Potassium: 4.3 mmol/L (ref 3.5–5.1)
Sodium: 141 mmol/L (ref 135–145)
TCO2: 34 mmol/L — ABNORMAL HIGH (ref 22–32)
pCO2, Ven: 45.2 mmHg (ref 44.0–60.0)
pH, Ven: 7.47 — ABNORMAL HIGH (ref 7.250–7.430)
pO2, Ven: 40 mmHg (ref 32.0–45.0)

## 2020-07-19 LAB — URINALYSIS, ROUTINE W REFLEX MICROSCOPIC
Bilirubin Urine: NEGATIVE
Glucose, UA: NEGATIVE mg/dL
Hgb urine dipstick: NEGATIVE
Ketones, ur: NEGATIVE mg/dL
Leukocytes,Ua: NEGATIVE
Nitrite: NEGATIVE
Protein, ur: NEGATIVE mg/dL
Specific Gravity, Urine: 1.01 (ref 1.005–1.030)
pH: 5 (ref 5.0–8.0)

## 2020-07-19 LAB — CBC
HCT: 30.5 % — ABNORMAL LOW (ref 36.0–46.0)
Hemoglobin: 9.3 g/dL — ABNORMAL LOW (ref 12.0–15.0)
MCH: 26.4 pg (ref 26.0–34.0)
MCHC: 30.5 g/dL (ref 30.0–36.0)
MCV: 86.6 fL (ref 80.0–100.0)
Platelets: 101 10*3/uL — ABNORMAL LOW (ref 150–400)
RBC: 3.52 MIL/uL — ABNORMAL LOW (ref 3.87–5.11)
RDW: 17.3 % — ABNORMAL HIGH (ref 11.5–15.5)
WBC: 4.9 10*3/uL (ref 4.0–10.5)
nRBC: 0 % (ref 0.0–0.2)

## 2020-07-19 LAB — I-STAT CHEM 8, ED
BUN: 39 mg/dL — ABNORMAL HIGH (ref 8–23)
Calcium, Ion: 1.06 mmol/L — ABNORMAL LOW (ref 1.15–1.40)
Chloride: 101 mmol/L (ref 98–111)
Creatinine, Ser: 1.6 mg/dL — ABNORMAL HIGH (ref 0.44–1.00)
Glucose, Bld: 189 mg/dL — ABNORMAL HIGH (ref 70–99)
HCT: 34 % — ABNORMAL LOW (ref 36.0–46.0)
Hemoglobin: 11.6 g/dL — ABNORMAL LOW (ref 12.0–15.0)
Potassium: 4.3 mmol/L (ref 3.5–5.1)
Sodium: 141 mmol/L (ref 135–145)
TCO2: 31 mmol/L (ref 22–32)

## 2020-07-19 LAB — COMPREHENSIVE METABOLIC PANEL
ALT: 17 U/L (ref 0–44)
AST: 26 U/L (ref 15–41)
Albumin: 3.8 g/dL (ref 3.5–5.0)
Alkaline Phosphatase: 89 U/L (ref 38–126)
Anion gap: 13 (ref 5–15)
BUN: 33 mg/dL — ABNORMAL HIGH (ref 8–23)
CO2: 26 mmol/L (ref 22–32)
Calcium: 9.4 mg/dL (ref 8.9–10.3)
Chloride: 100 mmol/L (ref 98–111)
Creatinine, Ser: 1.67 mg/dL — ABNORMAL HIGH (ref 0.44–1.00)
GFR calc Af Amer: 37 mL/min — ABNORMAL LOW (ref >60)
GFR calc non Af Amer: 32 mL/min — ABNORMAL LOW (ref >60)
Glucose, Bld: 190 mg/dL — ABNORMAL HIGH (ref 70–99)
Potassium: 4.3 mmol/L (ref 3.5–5.1)
Sodium: 139 mmol/L (ref 135–145)
Total Bilirubin: 1.8 mg/dL — ABNORMAL HIGH (ref 0.3–1.2)
Total Protein: 8.1 g/dL (ref 6.5–8.1)

## 2020-07-19 LAB — MAGNESIUM: Magnesium: 2.1 mg/dL (ref 1.7–2.4)

## 2020-07-19 LAB — GLUCOSE, CAPILLARY: Glucose-Capillary: 159 mg/dL — ABNORMAL HIGH (ref 70–99)

## 2020-07-19 LAB — TROPONIN I (HIGH SENSITIVITY)
Troponin I (High Sensitivity): 156 ng/L (ref <18)
Troponin I (High Sensitivity): 171 ng/L (ref <18)

## 2020-07-19 LAB — SARS CORONAVIRUS 2 BY RT PCR (HOSPITAL ORDER, PERFORMED IN ~~LOC~~ HOSPITAL LAB): SARS Coronavirus 2: NEGATIVE

## 2020-07-19 LAB — BRAIN NATRIURETIC PEPTIDE: B Natriuretic Peptide: 620.4 pg/mL — ABNORMAL HIGH (ref 0.0–100.0)

## 2020-07-19 MED ORDER — ACETAMINOPHEN 650 MG RE SUPP: 650.0000 mg | Freq: Four times a day (QID) | RECTAL | Status: DC | PRN

## 2020-07-19 MED ORDER — ASPIRIN EC 81 MG PO TBEC
81.0000 mg | DELAYED_RELEASE_TABLET | Freq: Every day | ORAL | Status: DC
  Administered 2020-07-20 – 2020-07-26 (×7): 81 mg via ORAL
  Filled 2020-07-19 (×7): qty 1

## 2020-07-19 MED ORDER — POTASSIUM CHLORIDE CRYS ER 20 MEQ PO TBCR
20.0000 meq | EXTENDED_RELEASE_TABLET | Freq: Every day | ORAL | Status: DC
  Administered 2020-07-19 – 2020-07-22 (×4): 20 meq via ORAL
  Filled 2020-07-19 (×4): qty 1

## 2020-07-19 MED ORDER — CARVEDILOL 12.5 MG PO TABS
12.5000 mg | ORAL_TABLET | Freq: Two times a day (BID) | ORAL | Status: DC
  Administered 2020-07-20 – 2020-07-26 (×13): 12.5 mg via ORAL
  Filled 2020-07-19 (×13): qty 1

## 2020-07-19 MED ORDER — FUROSEMIDE 10 MG/ML IJ SOLN
80.0000 mg | Freq: Once | INTRAMUSCULAR | Status: AC
  Administered 2020-07-19: 80 mg via INTRAVENOUS
  Filled 2020-07-19: qty 8

## 2020-07-19 MED ORDER — INSULIN ASPART 100 UNIT/ML ~~LOC~~ SOLN: 0.0000 [IU] | Freq: Three times a day (TID) | SUBCUTANEOUS | Status: DC

## 2020-07-19 MED ORDER — ACETAMINOPHEN 325 MG PO TABS: 650.0000 mg | ORAL_TABLET | Freq: Four times a day (QID) | ORAL | Status: DC | PRN

## 2020-07-19 MED ORDER — PANTOPRAZOLE SODIUM 40 MG PO TBEC
40.0000 mg | DELAYED_RELEASE_TABLET | Freq: Every day | ORAL | Status: DC
  Administered 2020-07-20 – 2020-07-26 (×7): 40 mg via ORAL
  Filled 2020-07-19 (×7): qty 1

## 2020-07-19 MED ORDER — SENNOSIDES-DOCUSATE SODIUM 8.6-50 MG PO TABS: 1.0000 | ORAL_TABLET | Freq: Every evening | ORAL | Status: DC | PRN

## 2020-07-19 MED ORDER — FUROSEMIDE 10 MG/ML IJ SOLN
40.0000 mg | Freq: Once | INTRAMUSCULAR | Status: AC
  Administered 2020-07-19: 40 mg via INTRAVENOUS
  Filled 2020-07-19: qty 4

## 2020-07-19 MED ORDER — ENOXAPARIN SODIUM 40 MG/0.4ML ~~LOC~~ SOLN
40.0000 mg | SUBCUTANEOUS | Status: DC
  Administered 2020-07-19 – 2020-07-25 (×7): 40 mg via SUBCUTANEOUS
  Filled 2020-07-19 (×7): qty 0.4

## 2020-07-19 MED ORDER — INSULIN ASPART 100 UNIT/ML ~~LOC~~ SOLN: 0.0000 [IU] | Freq: Every day | SUBCUTANEOUS | Status: DC

## 2020-07-19 NOTE — H&P (Addendum)
Date: 07/20/2020               Patient Name:  Rita [name] MRN: [id number]  DOB: October 08, 1956 Age / Sex: 64 y.o., female   PCP: [name], MD         Medical Service: Internal Medicine Teaching Service         Attending Physician: Dr. Jimmye Norman, Elaina Pattee, MD    First Contact: Dr. Coy Saunas Pager: [phone]  Second Contact: Dr. Koleen Distance Pager: [phone]       After Hours (After 5p/  First Contact Pager: [phone]  weekends / holidays): Second Contact Pager: [phone]   Chief Complaint: shortness of breath  History of Present Illness:  Rita [name] is a 64 y.o. lady with PMHx NIDDM type II, chronic Afib (on ASA), recent GI bleed April 2021 2/2 small bowel AVM's, CHF, pulmonary HTN, CKD stage III, hypertension, anemia, L breast cancer s/p lumpectomy, gout, and arthritis of the knees presenting with about 2 weeks of worsening shortness of breath. She states her SOB is worse when lying flat and wakes her from sleep. She sleeps sitting up. She also endorses fatigue and worsening swelling of her abdomen and legs. She feels her abdomen is bloated. She states she has gained about 3 lbs in the past week and states her baseline weight is about 256 lbs. She is prescribed '40mg'$  Lasix daily in the morning and PRN in the evening for increased swelling/weight gain. She states her symptoms have worsened despite taking '40mg'$  Lasix BID this past week. She endorses a couple of days of abdominal pain secondary to swelling and nausea recently, though both of which have since resolved. She endorses feeling cold with chills, but denies fevers, vomiting, CP, palpitations, difficulty urinating, pain with urinating, or gross blood in her stools. She does note she gets black stools intermittently but states this is secondary to her iron. She denies any sick contacts. She does note increased thirst and enjoys sugary foods, but states she has been trying to drink less fluid and eat less salt, recently.   Home  Meds: Current Meds  Medication Sig  . allopurinol (ZYLOPRIM) 100 MG tablet Take 100 mg by mouth 2 (two) times daily.  Marland Kitchen aspirin EC 81 MG tablet Take 81 mg by mouth daily. Swallow whole.  . blood glucose meter kit and supplies Dispense based on patient and insurance preference. Use up to four times daily as directed. (FOR ICD-10 E10.9, E11.9).  . carvedilol (COREG) 25 MG tablet Take 12.5 mg by mouth 2 (two) times daily with a meal.   . ferrous sulfate 325 (65 FE) MG tablet Take 325 mg by mouth 2 (two) times daily with a meal.   . furosemide (LASIX) 40 MG tablet Take 40-80 mg by mouth daily. Take one tablet (40 mg) by mouth every morning, may take a 2nd tablet (40 mg) in the evening (4-5pm) as needed for fluid/swelling  . glipiZIDE (GLUCOTROL XL) 5 MG 24 hr tablet Take 5 mg by mouth daily with breakfast.   . metFORMIN (GLUCOPHAGE-XR) 500 MG 24 hr tablet Take 500 mg by mouth daily with breakfast.  . OXYGEN Inhale 2 L into the lungs continuous.  . pantoprazole (PROTONIX) 40 MG tablet Take 40 mg by mouth daily.  . potassium chloride SA (KLOR-CON) 20 MEQ tablet Take 20 mEq by mouth daily.   Allergies: Allergies as of 07/19/2020 - Review Complete 07/19/2020  Allergen Reaction Noted  . Codeine Other (See Comments)  12/30/2019  . Prednisone Other (See Comments) 12/30/2019  . Moxifloxacin Rash 12/30/2019   Past Medical History:  Diagnosis Date  . Atrial fibrillation ([place])   . Chronic respiratory failure with hypoxia, on home O2 therapy ([place])   . Diabetes mellitus without complication ([place])   . Diastolic CHF ([place])   . GI bleed   . Hypertension     Family History:  Family History  Problem Relation Age of Onset  . Cancer Mother    Social History: Patient states that she has not smoked cigarettes since high school and denies any alcohol or other drug use. She lives at home with her husband.   Review of Systems: A complete ROS was negative except as per HPI.   Physical Exam: Blood pressure  120/77, pulse 74, temperature 98.1 F (36.7 C), temperature source Oral, resp. rate 11, height '5\' 1"'$  (1.549 m), weight 120 kg, SpO2 100 %. General: Patient appears well. No acute distress. Eyes: Sclera non-icteric. No conjunctival injection.  HENT: No nasal discharge. Poor dentition. Moist mucus membranes.  Respiratory: There are bibasilar rales, bilaterally. Otherwise, lungs are CTA bilaterally without wheezes or rhonchi.  Cardiovascular: Rate is normal. Rhythm is irregularly irregular. Frequent PVC's noted on telemetry during exam. There is a 2/6 blowing systolic murmur heard best along the left sternal border. No rubs or clicks. She has 1+ pitting edema, bilaterally to the thighs with chronic venous stasis changes of the bilateral lower extremities.  Abdominal: Soft and distended with significant fluid retention causing induration of the skin. Bowel sounds intact. No tenderness to palpation, rebound or guarding.  Neurological: Patient is somnolent. She is alert and oriented to person, place, and time.  Skin: Skin along bilateral shins is hyperpigmented but without lesions. No rashes noted. Psych: Normal affect. Normal tone of voice.   EKG: personally reviewed my interpretation is atrial fibrillation at a rate of about 83bpm. Incomplete LBBB, also seen on previous EKG from 06/26/20. No T wave inversions, ST segment elevations or depressions.   CXR: personally reviewed my interpretation is vascular congestion with moderate bilateral pleural effusions. There is also a density along the right horizontal fissure, consistent with pulmonary edema.  Assessment & Plan by Problem: Active Problems:   Acute exacerbation of CHF (congestive heart failure) (HCC)  Acute CHF Exacerbation Patient endorses 2 weeks of worsening SOB associated with dry cough, LE and abdominal swelling, 3lb weight gain, orthopnea, PND. CXR shows pulmonary congestion with bilateral pleural effusions. EKG shows Afib with incomplete  LBBB. Telemetry showed frequent PVC's. No previous ECHO available. BNP 620.4. Troponin I 156 > 171. Patient given '40mg'$  Lasix IV in ED but notes no increase in baseline urination. She states her dry weight is about 256lbs.  - Start Lasix '80mg'$  IV once daily and titrate up as needed - KCl CR '20mg'$  PO daily  - Will check ECHO  - Check lipid panel  - Check CMP, Mg2+, Phosphorous - Daily weights  - Strict I&O's  Acute on Chronic Hypoxic Respiratory Failure SpO2 was in the 80's in the emergency department on her chronic 2L [place] oxygen that she wears 24/7 at home. On exam, she was stabilized with SpO2 up to 100% on 4L [place]. RF is likely secondary to acute CHF exacerbation, although LBBB and pulmonary HTN noted in previous cardiology notes may be contributing.  - Continue supplemental oxygen with goal SpO2 > 92%  - Continuous pulse oximetry  Chronic Atrial Fibrillation EKG shows Afib at 83bpm. On examination, telemetry showed frequent  PVC's that were also present on previous EKG from 06/26/20. Outside notes report that patient has a history of chronic Afib and had previously been on Xarelto, although this was discontinued after she developed profuse GI bleeding in April 2021 and was found to have multiple small bowel AVM's. She currently takes ASA '81mg'$  daily. She denies any CP, LH, dizziness, or palpitations. - Continue ASA '81mg'$  daily  - Continuous telemetry - Will require monitoring for frequency of PVC's  Chronic Normocytic Anemia Hemoglobin 11.6. Patient has history of normocytic anemia and takes iron supplements (BID). Likely due to combination of IDA and anemia of chronic disease. Patient does have history of small bowel AVM's and reports dark stools 2/2 iron.  - Check morning CBC w/ diff - Monitor for melena / GI bleeding  - Continue ferrous sulfate '325mg'$  once daily  CKD stage IIIb Creatinine 1.60, BUN 39, GFR 37. Patient denies trouble urinating or other urinary symptoms. She reports no increase  in urination since receiving '40mg'$  Lasix IV in ED. - Start Lasix '80mg'$  IV daily  - Check morning CMP   NIDDM type II Patient states that she does not take her blood sugars at home. She endorses thirst and recent abdominal pain/nausea. She takes Glipizide '5mg'$  daily and Metformin '500mg'$  in the mornings only. Serum glucose 189. Last HgbA1c in May 2021 was 6.1. - Stop Metformin and glipizide - Start SSI with moderate correction coverage - Monitor CBGs  Hypertension Blood pressure well-controlled on home dose of carvedilol 12.'5mg'$  BID. - Continue carvedilol 12.'5mg'$  BID  Osteoarthritis of the Knees Patient endorses bilateral knee pain. She states she takes Tylenol for arthritis PRN at home.  - Tylenol '650mg'$  q6hrs PRN  - PT assessment  Gout, currently in remission Patient denies active gout flare currently. She takes allopurinol '100mg'$  BID at home.  - Will continue home dose for now - Check uric acid level and adjust as necessary   Dispo: Admit patient to Inpatient with expected length of stay greater than 2 midnights.  Diet: Heart healthy/carb modified DVT PPx: Enoxaparin '40mg'$  SQ daily Code Status: Full Code  Signed: Jeralyn Bennett, MD 07/20/2020, 1:39 AM  Pager: [phone] After 5pm on weekdays and 1pm on weekends: On Call pager: [phone]

## 2020-07-19 NOTE — ED Triage Notes (Signed)
Pt seen here in July for GI bleed.  Hx of CHF.  Sent from her pcp's for increasing sob and LE/abd edema.   VS  126/97 78 hr - afib 22 rr 97% on 2L [place]

## 2020-07-19 NOTE — ED Notes (Signed)
Admitting doctor at the bedside 

## 2020-07-19 NOTE — ED Notes (Signed)
Initially pt breathing unlabored.  However, pt was laid flat to use bathroom and she became increasingly labored.  Sats of 88 and hr increased to 120's.  Provider at beside.  Sob did not improve  With 6L [place], so pt placed on nrb.  Pt presently speaking to friends on phone.

## 2020-07-19 NOTE — ED Provider Notes (Signed)
[facility] EMERGENCY DEPARTMENT Provider Note   CSN: [id number] Arrival date & time: 07/19/20  1726     History Chief Complaint  Patient presents with   Shortness of Breath   Leg Swelling    Rita [name] is a 64 y.o. female.  64 y.o female with a PMH of Morbid obesity, CKD3, DM, HTN, Afib presents to the ED with a chief complaint of shortness of breath times last week.  Patient attempted to be seen by her PCP in [place] today, reports she was triaged there and sent to the ED for further management.  She reports the shortness of breath has been ongoing, there is no alleviating or exacerbating factors, this occurs at rest.  She is compliant with her Lasix, states noticed her legs swelling more often lately.  Patient does wear O2 via nasal cannula at home with a baseline of 2 L.  She arrived in the ED requiring more oxygen, was placed on 5 L O2 via nasal cannula.  Patient was laid flat in order to obtain urine, patient desatted, she was placed on additional O2, then placed on a nonrebreather his heart rate went up to the 1 teens. She denies any chest pain, palpitations, fever, cough or other complaints.   The history is provided by the patient and medical records.  Shortness of Breath Associated symptoms: no abdominal pain, no chest pain, no fever, no headaches, no sore throat and no vomiting        Past Medical History:  Diagnosis Date   Atrial fibrillation (HCC)    Chronic respiratory failure with hypoxia, on home O2 therapy (HCC)    Diabetes mellitus without complication (HCC)    Diastolic CHF ([place])    GI bleed    Hypertension     Patient Active Problem List   Diagnosis Date Noted   Pleural effusion    Acute on chronic respiratory failure ([place]) 06/27/2020   Heart failure ([place]) 06/27/2020   Goiter    Morbid obesity ([place]) 06/19/2020   Neck mass 06/18/2020   Acute respiratory failure with hypoxia ([place]) 06/16/2020   Acute  hypoxemic respiratory failure due to COVID-19 ([place]) 12/30/2019   Diabetes mellitus without complication (HCC)    Acute on chronic diastolic congestive heart failure (HCC)    AF (paroxysmal atrial fibrillation) ([place])    CKD (chronic kidney disease), stage III    Hypertension associated with diabetes [facility])     Past Surgical History:  Procedure Laterality Date   IR THORACENTESIS ASP PLEURAL SPACE W/IMG GUIDE  06/28/2020     OB History   No obstetric history on file.     Family History  Problem Relation Age of Onset   Cancer Mother     Social History   Tobacco Use   Smoking status: Never Smoker   Smokeless tobacco: Never Used  Substance Use Topics   Alcohol use: Never   Drug use: Never    Home Medications Prior to Admission medications   Medication Sig Start Date End Date Taking? Authorizing Provider  allopurinol (ZYLOPRIM) 100 MG tablet Take 100 mg by mouth 2 (two) times daily.   Yes [provider]  aspirin EC 81 MG tablet Take 81 mg by mouth daily. Swallow whole.   Yes [provider]  blood glucose meter kit and supplies Dispense based on patient and insurance preference. Use up to four times daily as directed. (FOR ICD-10 E10.9, E11.9). 01/02/20  Yes Ghimire, Henreitta Leber, MD  carvedilol (COREG) 25 MG tablet Take 12.5 mg by mouth 2 (two) times daily with a meal.    Yes [provider]  °ferrous sulfate 325 (65 FE) MG tablet Take 325 mg by mouth 2 (two) times daily with a meal.    Yes [provider]  °furosemide (LASIX) 40 MG tablet Take 40-80 mg by mouth daily. Take one tablet (40 mg) by mouth every morning, may take a 2nd tablet (40 mg) in the evening (4-5pm) as needed for fluid/swelling   Yes [provider]  °glipiZIDE (GLUCOTROL XL) 5 MG 24 hr tablet Take 5 mg by mouth daily with breakfast.  06/08/20  Yes [provider]  °metFORMIN (GLUCOPHAGE-XR) 500 MG 24 hr tablet Take 500 mg by mouth daily with breakfast.    Yes [provider]  °OXYGEN Inhale 2 L into the lungs continuous.   Yes [provider]  °pantoprazole (PROTONIX) 40 MG tablet Take 40 mg by mouth daily.   Yes [provider]  °potassium chloride SA (KLOR-CON) 20 MEQ tablet Take 20 mEq by mouth daily.   Yes [provider]  ° ° °Allergies    °Codeine, Prednisone, and Moxifloxacin ° °Review of Systems   °Review of Systems  °Constitutional: Negative for chills and fever.  °HENT: Negative for sore throat.   °Respiratory: Positive for shortness of breath.   °Cardiovascular: Positive for leg swelling (BL). Negative for chest pain.  °Gastrointestinal: Negative for abdominal pain, nausea and vomiting.  °Genitourinary: Negative for flank pain.  °Musculoskeletal: Negative for back pain.  °Skin: Negative for pallor and wound.  °Neurological: Negative for light-headedness and headaches.  °All other systems reviewed and are negative. ° ° °Physical Exam °Updated Vital Signs °BP 121/84 (BP Location: Right Arm)    Pulse 84    Temp (!) 97.4 °F (36.3 °C) (Oral)    Resp 16    Ht 5' 1" (1.549 m)    Wt 115.9 kg    SpO2 91%    BMI 48.28 kg/m²  ° °Physical Exam °Vitals and nursing note reviewed.  °Constitutional:   °   Appearance: She is well-developed. She is ill-appearing.  °HENT:  °   Head: Normocephalic and atraumatic.  °Eyes:  °   Pupils: Pupils are equal, round, and reactive to light.  °Cardiovascular:  °   Rate and Rhythm: Normal rate.  °   Pulses:     °     Posterior tibial pulses are 2+ on the right side and 2+ on the left side.  °Pulmonary:  °   Effort: Tachypnea present.  °   Breath sounds: Examination of the right-lower field reveals rales. Examination of the left-lower field reveals rales. Decreased breath sounds and rales present.  °Chest:  °   Chest wall: No tenderness.  °Abdominal:  °   Palpations: Abdomen is soft.  °   Tenderness: There is no abdominal tenderness.  °Musculoskeletal:  °   Right lower leg: Edema present.  °   Left  lower leg: Edema present.  °Skin: °   General: Skin is warm and dry.  °Neurological:  °   Mental Status: She is alert and oriented to person, place, and time.  ° ° ° °ED Results / Procedures / Treatments   °Labs °(all labs ordered are listed, but only abnormal results are displayed) °Labs Reviewed  °CBC - Abnormal; Notable for the following components:  °    Result Value  ° RBC 3.52 (*)   °   Hemoglobin 9.3 (*)    HCT 30.5 (*)    RDW 17.3 (*)    Platelets 101 (*)    All other components within normal limits  BRAIN NATRIURETIC PEPTIDE - Abnormal; Notable for the following components:   B Natriuretic Peptide 620.4 (*)    All other components within normal limits  COMPREHENSIVE METABOLIC PANEL - Abnormal; Notable for the following components:   Glucose, Bld 190 (*)    BUN 33 (*)    Creatinine, Ser 1.67 (*)    Total Bilirubin 1.8 (*)    GFR calc non Af Amer 32 (*)    GFR calc Af Amer 37 (*)    All other components within normal limits  I-STAT VENOUS BLOOD GAS, ED - Abnormal; Notable for the following components:   pH, Ven 7.470 (*)    Bicarbonate 32.8 (*)    TCO2 34 (*)    Acid-Base Excess 8.0 (*)    Calcium, Ion 1.07 (*)    HCT 33.0 (*)    Hemoglobin 11.2 (*)    All other components within normal limits  I-STAT CHEM 8, ED - Abnormal; Notable for the following components:   BUN 39 (*)    Creatinine, Ser 1.60 (*)    Glucose, Bld 189 (*)    Calcium, Ion 1.06 (*)    Hemoglobin 11.6 (*)    HCT 34.0 (*)    All other components within normal limits  TROPONIN I (HIGH SENSITIVITY) - Abnormal; Notable for the following components:   Troponin I (High Sensitivity) 156 (*)    All other components within normal limits  SARS CORONAVIRUS 2 BY RT PCR (HOSPITAL ORDER, [facility])  URINALYSIS, ROUTINE W REFLEX MICROSCOPIC  LIPID PANEL  MAGNESIUM  TROPONIN I (HIGH SENSITIVITY)    EKG None  Radiology DG Chest Portable 1 View  Result Date: 07/19/2020 CLINICAL DATA:   Shortness of breath and lower extremity edema. EXAM: PORTABLE CHEST 1 VIEW COMPARISON:  Chest x-ray dated June 28, 2020. FINDINGS: Stable cardiomegaly. Worsening bilateral mid and lower lung interstitial thickening and atelectasis. Increasing bilateral layering pleural effusions. No pneumothorax. No acute osseous abnormality. Surgical clips in the left axilla. IMPRESSION: 1. Worsening pulmonary edema and bilateral pleural effusions. Electronically Signed   By: Titus Dubin M.D.   On: 07/19/2020 18:17    Procedures Procedures (including critical care time)  Medications Ordered in ED Medications  furosemide (LASIX) injection 40 mg (40 mg Intravenous Given 07/19/20 1853)    ED Course  I have reviewed the triage vital signs and the nursing notes.  Pertinent labs & imaging results that were available during my care of the patient were reviewed by me and considered in my medical decision making (see chart for details).  Clinical Course as of Jul 20 1935  Wed Jul 19, 2020  1901 Slight elevation but similar to prior visits.   B Natriuretic Peptide(!): 620.4 [JS]  1901 Lower than her prior baseline, CKD.   Troponin I (High Sensitivity)(!!): 156 [JS]    Clinical Course User Index [JS] Janeece Fitting, PA-C   MDM Rules/Calculators/A&P   Patient with a past medical history of CHF, CKD 3, diabetes, morbid obesity presents to the ED via EMS for shortness of breath since last week.  Patient does report compliance with medication, was evaluated at PCPs office this morning, sent to the ED for further management.  She did arrive on 3 L of O2, satting at upper 90s.  She is on  3 L nasal cannula at home, this was increased to 5 L, during evaluation patient was laid flat in order to obtain urine sample by nursing staff, when I entered room patient's heart rate went up to 1 teens, oxygen saturation dropped down to low 80s, she was put on a nonrebreather.  Patient appears volume overloaded, there is bilateral  leg pitting edema.  No calf tenderness bilaterally.  Does have a prior history of A. fib, EKG remarkable for A. Fib, patient is currently not anticoagulated, according to cardiology note, patient had recurrent GI bleeds requiring multiple transfusions therefore she was discontinued.  She is however taking aspirin daily.  Bilateral lungs remarkable for crackles.  There is increased work of breathing, patient was somewhat confused when this episode occurred.  After placing her on a nonrebreather her O2 stats picked up once again, satting in the upper 90s now.  She is able to speak in full sentences, no respiratory distress after. BL extremities remarkable for likely acute on chronic venous statis.   Interpretation of her labs showed a CMP without electrolyte derangement.  Creatinine level is improved from prior patient does have a history of CKD 3.  BNP is slightly elevated than her baseline at 620.  Troponin is elevated, she does have prior positive troponin suspect this is likely from CAD denies any chest pain on today's visit.  Venous gas was obtained, pH is 7.4, UA without any signs of infection.  Covid test is currently pending.  Patient did receive both Pfizer vaccines.  Patient was able to be taken off nonrebreather, oxygen saturation is now in the 90s with 5 L via nasal cannula.  Remains in stable condition.  She was given IV Lasix 40 mg via IV, she will need admission for further diuresis.  Call placed for internal medicine admission.  7:33 PM Spoke to internal medicine service, who will admit patient for further management. Patient stable for further admission.    Portions of this note were generated with Lobby[employer]. Dictation errors may occur despite best attempts at proofreading.  Final Clinical Impression(s) / ED Diagnoses Final diagnoses:  Shortness of breath  Leg swelling    Rx / DC Orders ED Discharge Orders    None       Janeece Fitting, Hershal Coria 07/19/20 1936    Drenda Freeze, MD 08/31/20 [phone]

## 2020-07-20 ENCOUNTER — Inpatient Hospital Stay (HOSPITAL_COMMUNITY): Payer: PPO

## 2020-07-20 DIAGNOSIS — I5031 Acute diastolic (congestive) heart failure: Secondary | ICD-10-CM | POA: Diagnosis not present

## 2020-07-20 DIAGNOSIS — I509 Heart failure, unspecified: Secondary | ICD-10-CM

## 2020-07-20 DIAGNOSIS — I482 Chronic atrial fibrillation, unspecified: Secondary | ICD-10-CM

## 2020-07-20 DIAGNOSIS — M109 Gout, unspecified: Secondary | ICD-10-CM

## 2020-07-20 DIAGNOSIS — J9621 Acute and chronic respiratory failure with hypoxia: Secondary | ICD-10-CM

## 2020-07-20 DIAGNOSIS — D696 Thrombocytopenia, unspecified: Secondary | ICD-10-CM

## 2020-07-20 DIAGNOSIS — N1832 Chronic kidney disease, stage 3b: Secondary | ICD-10-CM

## 2020-07-20 DIAGNOSIS — E1122 Type 2 diabetes mellitus with diabetic chronic kidney disease: Secondary | ICD-10-CM

## 2020-07-20 DIAGNOSIS — I130 Hypertensive heart and chronic kidney disease with heart failure and stage 1 through stage 4 chronic kidney disease, or unspecified chronic kidney disease: Principal | ICD-10-CM

## 2020-07-20 DIAGNOSIS — M170 Bilateral primary osteoarthritis of knee: Secondary | ICD-10-CM

## 2020-07-20 DIAGNOSIS — Z7984 Long term (current) use of oral hypoglycemic drugs: Secondary | ICD-10-CM

## 2020-07-20 DIAGNOSIS — D649 Anemia, unspecified: Secondary | ICD-10-CM

## 2020-07-20 LAB — COMPREHENSIVE METABOLIC PANEL
ALT: 16 U/L (ref 0–44)
AST: 25 U/L (ref 15–41)
Albumin: 3.7 g/dL (ref 3.5–5.0)
Alkaline Phosphatase: 82 U/L (ref 38–126)
Anion gap: 10 (ref 5–15)
BUN: 31 mg/dL — ABNORMAL HIGH (ref 8–23)
CO2: 33 mmol/L — ABNORMAL HIGH (ref 22–32)
Calcium: 9.1 mg/dL (ref 8.9–10.3)
Chloride: 100 mmol/L (ref 98–111)
Creatinine, Ser: 1.71 mg/dL — ABNORMAL HIGH (ref 0.44–1.00)
GFR calc Af Amer: 36 mL/min — ABNORMAL LOW (ref >60)
GFR calc non Af Amer: 31 mL/min — ABNORMAL LOW (ref >60)
Glucose, Bld: 139 mg/dL — ABNORMAL HIGH (ref 70–99)
Potassium: 4.5 mmol/L (ref 3.5–5.1)
Sodium: 143 mmol/L (ref 135–145)
Total Bilirubin: 1.7 mg/dL — ABNORMAL HIGH (ref 0.3–1.2)
Total Protein: 7.8 g/dL (ref 6.5–8.1)

## 2020-07-20 LAB — CBC WITH DIFFERENTIAL/PLATELET
Abs Immature Granulocytes: 0.01 10*3/uL (ref 0.00–0.07)
Basophils Absolute: 0 10*3/uL (ref 0.0–0.1)
Basophils Relative: 1 %
Eosinophils Absolute: 0.2 10*3/uL (ref 0.0–0.5)
Eosinophils Relative: 3 %
HCT: 27.7 % — ABNORMAL LOW (ref 36.0–46.0)
Hemoglobin: 8.5 g/dL — ABNORMAL LOW (ref 12.0–15.0)
Immature Granulocytes: 0 %
Lymphocytes Relative: 22 %
Lymphs Abs: 1.1 10*3/uL (ref 0.7–4.0)
MCH: 26.6 pg (ref 26.0–34.0)
MCHC: 30.7 g/dL (ref 30.0–36.0)
MCV: 86.6 fL (ref 80.0–100.0)
Monocytes Absolute: 0.6 10*3/uL (ref 0.1–1.0)
Monocytes Relative: 12 %
Neutro Abs: 3.1 10*3/uL (ref 1.7–7.7)
Neutrophils Relative %: 62 %
Platelets: 96 10*3/uL — ABNORMAL LOW (ref 150–400)
RBC: 3.2 MIL/uL — ABNORMAL LOW (ref 3.87–5.11)
RDW: 17.2 % — ABNORMAL HIGH (ref 11.5–15.5)
WBC: 5 10*3/uL (ref 4.0–10.5)
nRBC: 0.4 % — ABNORMAL HIGH (ref 0.0–0.2)

## 2020-07-20 LAB — GLUCOSE, CAPILLARY
Glucose-Capillary: 131 mg/dL — ABNORMAL HIGH (ref 70–99)
Glucose-Capillary: 98 mg/dL (ref 70–99)
Glucose-Capillary: 170 mg/dL — ABNORMAL HIGH (ref 70–99)
Glucose-Capillary: 174 mg/dL — ABNORMAL HIGH (ref 70–99)

## 2020-07-20 LAB — PHOSPHORUS: Phosphorus: 5 mg/dL — ABNORMAL HIGH (ref 2.5–4.6)

## 2020-07-20 LAB — LIPID PANEL
Cholesterol: 124 mg/dL (ref 0–200)
HDL: 50 mg/dL (ref >40)
LDL Cholesterol: 65 mg/dL (ref 0–99)
Total CHOL/HDL Ratio: 2.5 RATIO
Triglycerides: 46 mg/dL (ref <150)
VLDL: 9 mg/dL (ref 0–40)

## 2020-07-20 LAB — URIC ACID: Uric Acid, Serum: 9.1 mg/dL — ABNORMAL HIGH (ref 2.5–7.1)

## 2020-07-20 LAB — ECHOCARDIOGRAM COMPLETE
Area-P 1/2: 3.08 cm2
Height: 61 in
S' Lateral: 2.7 cm
Weight: 4232.83 oz

## 2020-07-20 LAB — MAGNESIUM: Magnesium: 2.1 mg/dL (ref 1.7–2.4)

## 2020-07-20 MED ORDER — FUROSEMIDE 10 MG/ML IJ SOLN: 80.0000 mg | Freq: Once | INTRAMUSCULAR | Status: DC

## 2020-07-20 MED ORDER — INSULIN ASPART 100 UNIT/ML ~~LOC~~ SOLN
0.0000 [IU] | Freq: Three times a day (TID) | SUBCUTANEOUS | Status: DC
  Administered 2020-07-20: 2 [IU] via SUBCUTANEOUS
  Administered 2020-07-20: 1 [IU] via SUBCUTANEOUS
  Administered 2020-07-21: 2 [IU] via SUBCUTANEOUS
  Administered 2020-07-21: 1 [IU] via SUBCUTANEOUS
  Administered 2020-07-21 – 2020-07-22 (×2): 2 [IU] via SUBCUTANEOUS
  Administered 2020-07-22: 1 [IU] via SUBCUTANEOUS
  Administered 2020-07-22: 2 [IU] via SUBCUTANEOUS
  Administered 2020-07-23: 1 [IU] via SUBCUTANEOUS
  Administered 2020-07-23 (×2): 2 [IU] via SUBCUTANEOUS
  Administered 2020-07-24: 1 [IU] via SUBCUTANEOUS
  Administered 2020-07-24 (×2): 2 [IU] via SUBCUTANEOUS
  Administered 2020-07-25: 1 [IU] via SUBCUTANEOUS
  Administered 2020-07-25: 2 [IU] via SUBCUTANEOUS
  Administered 2020-07-25: 3 [IU] via SUBCUTANEOUS

## 2020-07-20 MED ORDER — FERROUS SULFATE 325 (65 FE) MG PO TABS
325.0000 mg | ORAL_TABLET | Freq: Every day | ORAL | Status: DC
  Administered 2020-07-21 – 2020-07-24 (×4): 325 mg via ORAL
  Filled 2020-07-20 (×5): qty 1

## 2020-07-20 MED ORDER — ALLOPURINOL 100 MG PO TABS
100.0000 mg | ORAL_TABLET | Freq: Two times a day (BID) | ORAL | Status: DC
  Administered 2020-07-21 – 2020-07-26 (×11): 100 mg via ORAL
  Filled 2020-07-20 (×11): qty 1

## 2020-07-20 MED ORDER — FUROSEMIDE 10 MG/ML IJ SOLN
80.0000 mg | Freq: Two times a day (BID) | INTRAMUSCULAR | Status: DC
  Administered 2020-07-20 – 2020-07-24 (×8): 80 mg via INTRAVENOUS
  Filled 2020-07-20 (×8): qty 8

## 2020-07-20 NOTE — Progress Notes (Signed)
PT Cancellation Note  Patient Details Name: Rita [name] MRN: [id number] DOB: 1956-07-04   Cancelled Treatment:    Reason Eval/Treat Not Completed: Other (comment).  Pt is eating lunch, will retry at another time.   Ramond Dial 07/20/2020, 12:56 PM  Mee Hives, PT MS Acute Rehab Dept. Number: [place] [facility] [place]

## 2020-07-20 NOTE — Progress Notes (Signed)
Subjective:   No acute events overnight. States she feels better in terms of her breathing. Expresses that she is glad she is in the hospital. Denies chest pain or abdominal pain. Feels like her belly is not as hard. When asked how she feels compared to her normal self, she states "it has been so long since I know what normal is." Estimates 5-6 years ago she had more strength and was working, but it has been a while since she felt like herself. Describes having country fried steak a few days prior to this admission and states it probably had too much salt in it. States she was told to reduce her fluid intake due to her heart failure so she does not drink enough water.   Of note, patient states she had breast cancer 30 years ago, and had chemo, radiation and lumpectomy. She saw Dr. Beryle Beams. She usually gets a mammogram every year but has not had it this year due to her repeat hospitalizations. About 12-15 years ago, she had surgery on her left heel and during her recovery, she developed a clot behind her right knee. A mesh was placed in her R leg to catch any clots. She was placed on a blood thinner until she was found to have GI bleed in April. She is only taking aspirin now.   Her GI doctor is Dr. Nehemiah Settle in [place] Her PCP is Dr. Gilford Rile  Objective:  Vital signs in last 24 hours: Vitals:   07/20/20 0033 07/20/20 0434 07/20/20 0740 07/20/20 0800  BP:  122/71  108/61  Pulse:  76  63  Resp:  14  11  Temp:  98.2 F (36.8 C) 97.9 F (36.6 C)   TempSrc:  Oral Oral   SpO2:  96%  94%  Weight: 120 kg     Height:        General: Obese, elderly woman laying in bed. No acute distress. Eyes: Sclera non-icteric.  Head: [place]/AT Resp/Chest: On 4L satting 98-99%, maintains saturations when turned down to 3L --> 2L. Lungs CTAB. Bibasilar rales bilaterally. Normal effort. Venous congestion in upper anterior chest CV: Normal rate. Rhythm is irregularly irregular. +JVD. There is a 2/6  blowing systolic murmur heard best along the LUSB. 1+ pitting edema, bilaterally to the thighs with chronic venous stasis changes on BLE Abdominal: Soft and distended with significant fluid retention. Indurated skin over lower abdomen. Bowel sounds intact. No tenderness to palpation Skin: Ecchymoses on anterior L lower abdominal wall. Hyperpigmented anterior shins, bilaterally. No rashes or lesions. Neuro: A&Ox3. Moves all extremities. Sensation intact Psych: Normal mood and affect.  Assessment/Plan:  Active Problems:   Acute exacerbation of CHF (congestive heart failure) (HCC)  Acute CHF Exacerbation Patient presented with 2 weeks of worsening SOB associated with dry cough, LE and abdominal swelling, weight gain, orthopnea, and PND. CXR shows pulmonary congestion with bilateral pleural effusions. BNP 620.4. Troponin I 156 > 171. S/p 40mg  Lasix IV in ED. ECHO shows EF of 35-40% w/ decreased LVSF, grade II diastolic dysfunction and mod RA & LA dilation. Dry weight is ~256lbs and currently 8 lbs above dry weight. I/O neg 650 cc overnight. SOB improved but continues to be volume overloaded on exam. Increasing lasix dose.   -Continue Lasix 80mg  IV BID --Continue KCl CR 20mg  PO daily  - Will check ECHO  --Normal lipid panel  --Normal Mg2+, Phosphorous of 6 --Daily weights  --Strict I&O's  Acute on Chronic Hypoxic Respiratory Failure SpO2 was  in the 80's on admission on her chronic 2L [place] oxygen that she wears 24/7 at home.CXR shows worsening pulmonary edema and bilateral pleural effusions. Likely 2/2 acute CHF exacerbation. LBBB and pulmonary HTN noted in previous cardiology notes may be contributing factors. States SOB is improved. Satting 92-94% on 2LNC --Maintain SpO2 > 92%  --Continuous pulse oximetry  Chronic Atrial Fibrillation History of chronic afib on rate control. 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 Currently takes ASA 81mg  daily. She denies any CP, dizziness, or palpitations. CV exam shows irregularly irregular rhythm consistent with afib.  --Continue ASA 81mg  daily  --Continue Carvedilol 12.5 mg --Continuous telemetry, monitor frequency of PVC's  Chronic Normocytic Anemia Hemoglobin 11.6 on admission and down to 8.5. Patient has history of normocytic anemia and takes iron supplements (BID). Likely due to combination of IDA and anemia of chronic disease. Has history of small bowel w/ AVM's and reports dark stools 2/2 iron.  --Check morning CBC w/ diff --Follow up iron levels --Monitor for melena/GI bleeding  --Continue ferrous sulfate 325mg  once daily  Thrombocytopenia Patient with low platelet levels at recent hospital admissions. Platelet decrease from 119 three wks ago to 96 today. Likely 2/2 to chronic bleeding and chronic anemia but will consider hemolytic anemia on the differential due to elevated bilirubin.  --Check CBC w/ diff --Follow up retic count --Consider peripheral smear   CKD stage IIIb Creatinine 1.60, BUN 39, GFR 37 on admission.  Patient denies trouble urinating or other urinary symptoms. S/p 40mg  Lasix IV in ED. sCr now at 1.71 and elevated phosphorus of 5.  --Continue Lasix 80mg  IV daily  --Follow up morning CMP   NIDDM type II Patient does not take her blood sugars at home. She endorses thirst and recent abdominal pain/nausea. She takes Glipizide 5mg  daily and Metformin 500mg  in the AM only. Holding meds and on SSI. Last HgbA1c in May 2021 was 6.1. --Holding Metformin and glipizide --Continue SSI with moderate correction coverage --Monitor CBGs  Hypertension Blood pressure well-controlled on home dose of carvedilol 12.5mg  BID. BP currently stable with sBP 100s-120s.  --Continue carvedilol 12.5mg  BID --Daily vitals  Osteoarthritis of the Knees Patient endorses bilateral knee pain. States she takes Tylenol for arthritis PRN  at home.  --Continue Tylenol 650mg  q6hrs PRN  --Continue PT eval  Gout, currently in remission Patient denies active gout flare currently. On allopurinol 100mg  BID at home. Uric acid elevated to 9.1. Treatment goal <6.5. --Continue allopurinol 100 mg BID --Make adjustments and recheck   Prior to Admission Living Arrangement: Home Anticipated Discharge Location: Home Barriers to Discharge: Pending medical work up Dispo: Anticipated discharge in approximately 2-3 day(s).   Lacinda Axon, MD 07/20/2020, 11:19 PM Pager: [phone] Internal Medicine Teaching Service After 5pm on weekdays and 1pm on weekends: On Call pager [phone]

## 2020-07-20 NOTE — Progress Notes (Signed)
Echocardiogram 2D Echocardiogram has been performed.  Oneal Deputy Swannie Milius 07/20/2020, 11:23 AM

## 2020-07-20 NOTE — Evaluation (Signed)
Physical Therapy Evaluation Patient Details Name: Rita [name] MRN: [id number] DOB: 1956-08-31 Today's Date: 07/20/2020   History of Present Illness  64 yo female with LE edema and SOB was noted to have CHF.  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, pulmonary HTN, persistent Afib, and diastolic HF on supplmental O2 prn  Clinical Impression  Pt was seen for mobility on RW with struggle to move initially due to fatigue.  Her ability to get to chair was an improvement, using purwick but could likely convert to commode.  Pt has husband with her to increase her support, to encourage her and to be a good source of history.  Follow up with her acutely as tolerated to increase safety and reduce fall risk.    Follow Up Recommendations Home health PT;Supervision for mobility/OOB    Equipment Recommendations  Rolling walker with 5" wheels    Recommendations for Other Services       Precautions / Restrictions Precautions Precautions: Fall Precaution Comments: monitor O2 sats Restrictions Weight Bearing Restrictions: No      Mobility  Bed Mobility Overal bed mobility: Needs Assistance Bed Mobility: Supine to Sit     Supine to sit: Min assist     General bed mobility comments: min assist to lift trunk and support to scoot to EOB  Transfers Overall transfer level: Needs assistance Equipment used: Rolling walker (2 wheeled);1 person hand held assist Transfers: Sit to/from Stand Sit to Stand: Min assist         General transfer comment: min to power up on walker to step to chair  Ambulation/Gait Ambulation/Gait assistance: Min assist Gait Distance (Feet): 10 Feet (6+4) Assistive device: Rolling walker (2 wheeled);1 person hand held assist Gait Pattern/deviations: Step-to pattern;Wide base of support;Shuffle;Decreased stride length Gait velocity: reduced Gait velocity interpretation: <1.31 ft/sec, indicative of household ambulator General Gait  Details: short steps to step up side of bed and then to sidestep to chair  Stairs            Wheelchair Mobility    Modified Rankin (Stroke Patients Only)       Balance Overall balance assessment: Needs assistance Sitting-balance support: Feet supported Sitting balance-Leahy Scale: Fair     Standing balance support: Bilateral upper extremity supported;During functional activity Standing balance-Leahy Scale: Poor                               Pertinent Vitals/Pain Pain Assessment: No/denies pain    Home Living Family/patient expects to be discharged to:: Private residence Living Arrangements: Spouse/significant other Available Help at Discharge: Family;Available 24 hours/day Type of Home: House Home Access: Stairs to enter Entrance Stairs-Rails: Right Entrance Stairs-Number of Steps: 2 front 2 back Home Layout: One level Home Equipment: Cane - single point;Grab bars - tub/shower Additional Comments: on O2 at home    Prior Function Level of Independence: Independent with assistive device(s)         Comments: cane or RW     Hand Dominance   Dominant Hand: Right    Extremity/Trunk Assessment   Upper Extremity Assessment Upper Extremity Assessment: Overall WFL for tasks assessed    Lower Extremity Assessment Lower Extremity Assessment: Generalized weakness    Cervical / Trunk Assessment Cervical / Trunk Assessment: Kyphotic  Communication   Communication: No difficulties  Cognition Arousal/Alertness: Awake/alert Behavior During Therapy: WFL for tasks assessed/performed Overall Cognitive Status: Within Functional Limits for  tasks assessed                                        General Comments General comments (skin integrity, edema, etc.): Pt is up to side of bed with purwick in place, voided on side of bed and then needed to change clothing and transfer to chair to get bed cleaned up    Exercises      Assessment/Plan    PT Assessment Patient needs continued PT services  PT Problem List Decreased strength;Decreased range of motion;Decreased activity tolerance;Decreased balance;Decreased mobility;Decreased coordination;Decreased knowledge of use of DME;Decreased safety awareness;Cardiopulmonary status limiting activity;Obesity       PT Treatment Interventions DME instruction;Gait training;Stair training;Functional mobility training;Therapeutic activities;Therapeutic exercise;Balance training;Neuromuscular re-education;Patient/family education    PT Goals (Current goals can be found in the Care Plan section)  Acute Rehab PT Goals Patient Stated Goal: to get stronger and get home safe PT Goal Formulation: With patient/family Time For Goal Achievement: 07/27/20 Potential to Achieve Goals: Good    Frequency Min 3X/week   Barriers to discharge Decreased caregiver support;Inaccessible home environment      Co-evaluation               AM-PAC PT "6 Clicks" Mobility  Outcome Measure Help needed turning from your back to your side while in a flat bed without using bedrails?: A Little Help needed moving from lying on your back to sitting on the side of a flat bed without using bedrails?: A Little Help needed moving to and from a bed to a chair (including a wheelchair)?: A Little Help needed standing up from a chair using your arms (e.g., wheelchair or bedside chair)?: A Little Help needed to walk in hospital room?: A Lot Help needed climbing 3-5 steps with a railing? : A Lot 6 Click Score: 16    End of Session Equipment Utilized During Treatment: Oxygen;Gait belt Activity Tolerance: Patient tolerated treatment well;Treatment limited secondary to medical complications (Comment) Patient left: in chair;with call bell/phone within reach;with chair alarm set;with family/visitor present Nurse Communication: Mobility status PT Visit Diagnosis: Unsteadiness on feet (R26.81);Muscle  weakness (generalized) (M62.81);Difficulty in walking, not elsewhere classified (R26.2)    Time: 8416-6063 PT Time Calculation (min) (ACUTE ONLY): 35 min   Charges:   PT Evaluation $PT Eval Moderate Complexity: 1 Mod PT Treatments $Therapeutic Activity: 8-22 mins       Ramond Dial 07/20/2020, 10:29 PM  Mee Hives, PT MS Acute Rehab Dept. Number: [place]

## 2020-07-21 ENCOUNTER — Inpatient Hospital Stay (HOSPITAL_COMMUNITY): Payer: PPO

## 2020-07-21 DIAGNOSIS — I5043 Acute on chronic combined systolic (congestive) and diastolic (congestive) heart failure: Secondary | ICD-10-CM | POA: Diagnosis present

## 2020-07-21 LAB — CBC WITH DIFFERENTIAL/PLATELET
Abs Immature Granulocytes: 0.01 10*3/uL (ref 0.00–0.07)
Basophils Absolute: 0 10*3/uL (ref 0.0–0.1)
Basophils Relative: 1 %
Eosinophils Absolute: 0.2 10*3/uL (ref 0.0–0.5)
Eosinophils Relative: 4 %
HCT: 27.8 % — ABNORMAL LOW (ref 36.0–46.0)
Hemoglobin: 8.5 g/dL — ABNORMAL LOW (ref 12.0–15.0)
Immature Granulocytes: 0 %
Lymphocytes Relative: 20 %
Lymphs Abs: 0.8 10*3/uL (ref 0.7–4.0)
MCH: 26.7 pg (ref 26.0–34.0)
MCHC: 30.6 g/dL (ref 30.0–36.0)
MCV: 87.4 fL (ref 80.0–100.0)
Monocytes Absolute: 0.5 10*3/uL (ref 0.1–1.0)
Monocytes Relative: 12 %
Neutro Abs: 2.7 10*3/uL (ref 1.7–7.7)
Neutrophils Relative %: 63 %
Platelets: 91 10*3/uL — ABNORMAL LOW (ref 150–400)
RBC: 3.18 MIL/uL — ABNORMAL LOW (ref 3.87–5.11)
RDW: 17.3 % — ABNORMAL HIGH (ref 11.5–15.5)
WBC: 4.3 10*3/uL (ref 4.0–10.5)
nRBC: 0 % (ref 0.0–0.2)

## 2020-07-21 LAB — COMPREHENSIVE METABOLIC PANEL
ALT: 14 U/L (ref 0–44)
AST: 22 U/L (ref 15–41)
Albumin: 3.5 g/dL (ref 3.5–5.0)
Alkaline Phosphatase: 80 U/L (ref 38–126)
Anion gap: 8 (ref 5–15)
BUN: 34 mg/dL — ABNORMAL HIGH (ref 8–23)
CO2: 34 mmol/L — ABNORMAL HIGH (ref 22–32)
Calcium: 9.2 mg/dL (ref 8.9–10.3)
Chloride: 100 mmol/L (ref 98–111)
Creatinine, Ser: 1.88 mg/dL — ABNORMAL HIGH (ref 0.44–1.00)
GFR calc Af Amer: 32 mL/min — ABNORMAL LOW (ref >60)
GFR calc non Af Amer: 27 mL/min — ABNORMAL LOW (ref >60)
Glucose, Bld: 201 mg/dL — ABNORMAL HIGH (ref 70–99)
Potassium: 4.3 mmol/L (ref 3.5–5.1)
Sodium: 142 mmol/L (ref 135–145)
Total Bilirubin: 1.5 mg/dL — ABNORMAL HIGH (ref 0.3–1.2)
Total Protein: 7.7 g/dL (ref 6.5–8.1)

## 2020-07-21 LAB — GLUCOSE, CAPILLARY
Glucose-Capillary: 174 mg/dL — ABNORMAL HIGH (ref 70–99)
Glucose-Capillary: 161 mg/dL — ABNORMAL HIGH (ref 70–99)
Glucose-Capillary: 147 mg/dL — ABNORMAL HIGH (ref 70–99)
Glucose-Capillary: 237 mg/dL — ABNORMAL HIGH (ref 70–99)

## 2020-07-21 LAB — IRON AND TIBC
Iron: 35 ug/dL (ref 28–170)
Saturation Ratios: 12 % (ref 10.4–31.8)
TIBC: 290 ug/dL (ref 250–450)
UIBC: 255 ug/dL

## 2020-07-21 LAB — HEPATITIS C ANTIBODY: HCV Ab: NONREACTIVE

## 2020-07-21 LAB — RETICULOCYTES
Immature Retic Fract: 18.2 % — ABNORMAL HIGH (ref 2.3–15.9)
RBC.: 3.17 MIL/uL — ABNORMAL LOW (ref 3.87–5.11)
Retic Count, Absolute: 38 10*3/uL (ref 19.0–186.0)
Retic Ct Pct: 1.2 % (ref 0.4–3.1)

## 2020-07-21 LAB — HEPATITIS B CORE ANTIBODY, TOTAL: Hep B Core Total Ab: NONREACTIVE

## 2020-07-21 LAB — HEPATITIS B SURFACE ANTIGEN: Hepatitis B Surface Ag: NONREACTIVE

## 2020-07-21 MED ORDER — METOLAZONE 2.5 MG PO TABS
2.5000 mg | ORAL_TABLET | ORAL | Status: DC
  Administered 2020-07-22 – 2020-07-24 (×3): 2.5 mg via ORAL
  Filled 2020-07-21 (×3): qty 1

## 2020-07-21 MED ORDER — METOLAZONE 2.5 MG PO TABS
2.5000 mg | ORAL_TABLET | Freq: Once | ORAL | Status: AC
  Administered 2020-07-21: 2.5 mg via ORAL
  Filled 2020-07-21: qty 1

## 2020-07-21 MED ORDER — GLIPIZIDE ER 5 MG PO TB24
5.0000 mg | ORAL_TABLET | Freq: Every day | ORAL | Status: DC
  Administered 2020-07-22 – 2020-07-24 (×3): 5 mg via ORAL
  Filled 2020-07-21 (×5): qty 1

## 2020-07-21 NOTE — Progress Notes (Signed)
Subjective:   No acute events overnight. Patient was evaluated at the bedside.  States she is feeling better today.  Her breathing has improved, however she continues to feel that her stomach remains tight.  She denies any abdominal pain no chest pain.  Patient remains on 3 L [place] with sats between 93% and 96%  Of note, patient was seen in the afternoon with husband at bedside, both expressed the desire to help patient stay away from the hospital.  Patient states she may be breaking up with her doctor since she always end up in the hospital when she needs to see him. Husband states they are tired of going to different hospitals and they are okay coming to [facility] for his primary care if they can manage her problems better. They agree to have a hospital follow up with our internal medicine clinic.   Her GI doctor is Dr. Nehemiah Settle in [place] Her PCP is Dr. Gilford Rile  Objective:  Vital signs in last 24 hours: Vitals:   07/20/20 1629 07/20/20 1919 07/21/20 0043 07/21/20 0343  BP: (!) 104/51 126/62  130/63  Pulse: (!) 45 65  76  Resp: 17 20  20   Temp: 98.2 F (36.8 C) 98.1 F (36.7 C)  98.5 F (36.9 C)  TempSrc: Oral Oral  Oral  SpO2: 100% 100%  97%  Weight:   120 kg   Height:        General: Obese, elderly woman laying in bed. No acute distress. Eyes: Sclera non-icteric.  Head: [place]/AT Resp/Chest: On 3L satting 98-99%. Lungs CTAB. Bibasilar rales bilaterally. Normal effort. Venous congestion in upper anterior chest CV: Normal rate. Rhythm is irregularly irregular. +JVD. There is a 2/6 blowing systolic murmur heard best along the LUSB. 1+ pitting edema, bilaterally to the thighs with chronic venous stasis changes. Abdominal: Soft and distended with significant fluid retention. Indurated skin over lower abdomen. Bowel sounds intact. No tenderness to palpation Skin: Ecchymoses on anterior L lower abdominal wall. Hyperpigmented anterior shins, bilaterally. No rashes or lesions. Neuro:  A&Ox3. Moves all extremities. Sensation intact Psych: Normal mood and affect.  Assessment/Plan:   Principal Problem:   Acute exacerbation of CHF (congestive heart failure) (HCC) Active Problems:   CKD (chronic kidney disease), stage III   Acute respiratory failure with hypoxia (HCC)  Acute on Chronic Systolic and Diastolic Heart Failure Patient presented with 2 weeks of worsening SOB associated with dry cough, LE and abdominal swelling, weight gain, orthopnea, and PND. CXR shows pulmonary congestion with bilateral pleural effusions. BNP 620.4. Troponin I 156 > 171. S/p 40mg  Lasix IV in ED. ECHO shows EF of 35-40% w/ decreased LVSF, grade II diastolic dysfunction and mod RA & LA dilation. Dry weight is ~256lbs and currently 8 lbs above dry weight. I/O neg 650 cc overnight. SOB improved but continues to be volume overloaded on exam. Increasing lasix dose.   -Continue Lasix 80mg  IV BID --Continue KCl CR 20mg  PO daily  - Will check ECHO  --Normal lipid panel  --Normal Mg2+, Phosphorous of 6 --Daily weights  --Strict I&O's   Acute on Chronic Hypoxic Respiratory Failure SpO2 was in the 80's on admission on her chronic 2L [place] oxygen that she wears 24/7 at home.CXR shows worsening pulmonary edema and bilateral pleural effusions. Likely 2/2 acute CHF exacerbation. LBBB and pulmonary HTN noted in previous cardiology notes may be contributing factors. States SOB is improved. Satting 92-94% on 2LNC --Maintain SpO2 > 92%  --Continuous pulse oximetry  Chronic Atrial Fibrillation History of chronic afib on rate control. 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 Currently takes ASA 81mg  daily. She denies any CP, dizziness, or palpitations. CV exam shows irregularly irregular rhythm consistent with afib.  --Continue ASA 81mg  daily  --Continue Carvedilol 12.5 mg --Continuous telemetry, monitor frequency  of PVC's   Chronic Normocytic Anemia Hemoglobin 11.6 on admission and down to 8.5. Patient has history of normocytic anemia and takes iron supplements (BID). Likely due to combination of IDA and anemia of chronic disease. Has history of small bowel w/ AVM's and reports dark stools 2/2 iron.  --Check morning CBC w/ diff --Follow up iron levels --Monitor for melena/GI bleeding  --Continue ferrous sulfate 325mg  once daily  Thrombocytopenia Patient with low platelet levels at recent hospital admissions. Platelet decrease from 119 three wks ago to 96 today. Likely 2/2 to chronic bleeding and chronic anemia but will consider hemolytic anemia on the differential due to elevated bilirubin.  --Check CBC w/ diff --Follow up retic count --Consider peripheral smear    CKD stage IIIb Creatinine 1.60, BUN 39, GFR 37 on admission.  Patient denies trouble urinating or other urinary symptoms. S/p 40mg  Lasix IV in ED. sCr now at 1.71 and elevated phosphorus of 5.  --Continue Lasix 80mg  IV daily  --Follow up morning CMP    NIDDM type II Patient does not take her blood sugars at home. She endorses thirst and recent abdominal pain/nausea. She takes Glipizide 5mg  daily and Metformin 500mg  in the AM only. Holding meds and on SSI. Last HgbA1c in May 2021 was 6.1. --Holding Metformin and glipizide --Continue SSI with moderate correction coverage --Monitor CBGs   Hypertension Blood pressure well-controlled on home dose of carvedilol 12.5mg  BID. BP currently stable with sBP 100s-120s.  --Continue carvedilol 12.5mg  BID --Daily vitals   Osteoarthritis of the Knees Patient endorses bilateral knee pain. States she takes Tylenol for arthritis PRN at home.  --Continue Tylenol 650mg  q6hrs PRN  --Continue PT eval   Gout, currently in remission Patient denies active gout flare currently. On allopurinol 100mg  BID at home. Uric acid elevated to 9.1. Treatment goal <6.5. --Continue allopurinol 100 mg BID --Make  adjustments and recheck   Prior to Admission Living Arrangement: Home Anticipated Discharge Location: Home Barriers to Discharge: Pending medical work up Dispo: Anticipated discharge in approximately 2-3 day(s).   Lacinda Axon, MD 07/21/2020, 7:27 AM Pager: [phone] Internal Medicine Teaching Service After 5pm on weekdays and 1pm on weekends: On Call pager [phone]

## 2020-07-22 LAB — COMPREHENSIVE METABOLIC PANEL
ALT: 14 U/L (ref 0–44)
AST: 22 U/L (ref 15–41)
Albumin: 3.5 g/dL (ref 3.5–5.0)
Alkaline Phosphatase: 81 U/L (ref 38–126)
Anion gap: 10 (ref 5–15)
BUN: 32 mg/dL — ABNORMAL HIGH (ref 8–23)
CO2: 33 mmol/L — ABNORMAL HIGH (ref 22–32)
Calcium: 9.6 mg/dL (ref 8.9–10.3)
Chloride: 98 mmol/L (ref 98–111)
Creatinine, Ser: 1.64 mg/dL — ABNORMAL HIGH (ref 0.44–1.00)
GFR calc Af Amer: 37 mL/min — ABNORMAL LOW (ref >60)
GFR calc non Af Amer: 32 mL/min — ABNORMAL LOW (ref >60)
Glucose, Bld: 154 mg/dL — ABNORMAL HIGH (ref 70–99)
Potassium: 3.7 mmol/L (ref 3.5–5.1)
Sodium: 141 mmol/L (ref 135–145)
Total Bilirubin: 1.7 mg/dL — ABNORMAL HIGH (ref 0.3–1.2)
Total Protein: 7.8 g/dL (ref 6.5–8.1)

## 2020-07-22 LAB — CBC WITH DIFFERENTIAL/PLATELET
Abs Immature Granulocytes: 0.02 10*3/uL (ref 0.00–0.07)
Basophils Absolute: 0 10*3/uL (ref 0.0–0.1)
Basophils Relative: 0 %
Eosinophils Absolute: 0.3 10*3/uL (ref 0.0–0.5)
Eosinophils Relative: 6 %
HCT: 26.9 % — ABNORMAL LOW (ref 36.0–46.0)
Hemoglobin: 8.5 g/dL — ABNORMAL LOW (ref 12.0–15.0)
Immature Granulocytes: 0 %
Lymphocytes Relative: 18 %
Lymphs Abs: 0.9 10*3/uL (ref 0.7–4.0)
MCH: 26.9 pg (ref 26.0–34.0)
MCHC: 31.6 g/dL (ref 30.0–36.0)
MCV: 85.1 fL (ref 80.0–100.0)
Monocytes Absolute: 0.6 10*3/uL (ref 0.1–1.0)
Monocytes Relative: 11 %
Neutro Abs: 3.2 10*3/uL (ref 1.7–7.7)
Neutrophils Relative %: 65 %
Platelets: 95 10*3/uL — ABNORMAL LOW (ref 150–400)
RBC: 3.16 MIL/uL — ABNORMAL LOW (ref 3.87–5.11)
RDW: 17.2 % — ABNORMAL HIGH (ref 11.5–15.5)
WBC: 5 10*3/uL (ref 4.0–10.5)
nRBC: 0 % (ref 0.0–0.2)

## 2020-07-22 LAB — PHOSPHORUS: Phosphorus: 4.2 mg/dL (ref 2.5–4.6)

## 2020-07-22 LAB — GLUCOSE, CAPILLARY
Glucose-Capillary: 164 mg/dL — ABNORMAL HIGH (ref 70–99)
Glucose-Capillary: 135 mg/dL — ABNORMAL HIGH (ref 70–99)
Glucose-Capillary: 165 mg/dL — ABNORMAL HIGH (ref 70–99)
Glucose-Capillary: 145 mg/dL — ABNORMAL HIGH (ref 70–99)

## 2020-07-22 LAB — OCCULT BLOOD X 1 CARD TO LAB, STOOL: Fecal Occult Bld: NEGATIVE

## 2020-07-22 LAB — URIC ACID: Uric Acid, Serum: 9.5 mg/dL — ABNORMAL HIGH (ref 2.5–7.1)

## 2020-07-22 MED ORDER — SENNOSIDES-DOCUSATE SODIUM 8.6-50 MG PO TABS
2.0000 | ORAL_TABLET | Freq: Two times a day (BID) | ORAL | Status: DC
  Administered 2020-07-22 – 2020-07-25 (×7): 2 via ORAL
  Filled 2020-07-22 (×9): qty 2

## 2020-07-22 NOTE — Progress Notes (Signed)
Subjective:   [name] slept really well last night. She is having good urine output. Feels her abdomen is getting less tense. Her breathing feels normal at rest, but still gets quite short of breath with any type of movement. Denies any chest pain. Has not moved her bowels since admission.   Objective:  Vital signs in last 24 hours: Vitals:   07/21/20 1634 07/21/20 1935 07/21/20 2100 07/22/20 0425  BP: (!) 121/52 120/60  (!) 124/58  Pulse: 97 (!) 42  60  Resp: 18 17 16 18   Temp:  97.7 F (36.5 C)  98 F (36.7 C)  TempSrc:  Oral  Oral  SpO2: 93% 99%  96%  Weight:    119.4 kg  Height:        General: Obese, elderly woman laying comfortably in bed. No acute distress. Eyes: Sclera non-icteric.  Head: [place]/AT Resp/Chest: On 3L satting 98-99%. Lungs CTAB. Bibasilar rales bilaterally. Normal effort. Venous congestion in upper anterior chest CV: Normal rate. Rhythm is irregularly irregular. +JVD. There is a 2/6 blowing systolic murmur heard best along the LUSB. 1+ pitting edema, bilaterally to the thighs with chronic venous stasis changes. Abdominal: Soft and distended with significant fluid retention. Indurated skin over lower abdomen, improving. Bowel sounds intact. No tenderness to palpation Skin: Ecchymoses on anterior L lower abdominal wall. Hyperpigmented anterior shins, bilaterally. No rashes or lesions. Neuro: A&Ox3. Moves all extremities. Sensation intact Psych: Normal mood and affect.  Assessment/Plan:   Principal Problem:   Acute on chronic combined systolic and diastolic heart failure (HCC) Active Problems:   CKD (chronic kidney disease), stage III   Acute respiratory failure with hypoxia (HCC)   Acute exacerbation of CHF (congestive heart failure) (HCC)  Acute on Chronic Systolic and Diastolic Heart Failure Patient presented with 2 weeks of worsening SOB associated with dry cough, LE and abdominal swelling, weight gain, orthopnea, and PND. CXR shows pulmonary  congestion with bilateral pleural effusions. BNP 620.4. Troponin I 156 > 171. S/p 40mg  Lasix IV in ED. ECHO shows EF of 35-40% w/ decreased LVSF, grade II diastolic dysfunction and mod RA & LA dilation. Dry weight is ~256lbs and currently 8 lbs above dry weight. I/O neg -1.05 L overnight after adding metolazone. SOB improved but continues to be volume overloaded on exam. --Continue Lasix 80mg  IV BID --Continue Metolazone 2.5 mg daily --Continue KCl CR 20mg  PO daily  --Normal lipid panel  --Normal Mg2+, Phos --Daily weights  --Strict I&O's   Acute on Chronic Hypoxic Respiratory Failure SpO2 was in the 80's on admission on her chronic 2L [place] oxygen that she wears 24/7 at home.CXR shows worsening pulmonary edema and bilateral pleural effusions. Likely 2/2 acute CHF exacerbation. LBBB and pulmonary HTN noted in previous cardiology notes may be contributing factors. SOB improved. Satting 92-94% on 2LNC --Maintain SpO2 > 92%  --Continuous pulse oximetry   Chronic Atrial Fibrillation, Stable History of chronic afib on rate control. 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 Currently takes ASA 81mg  daily. She denies any CP, dizziness, or palpitations. CV exam shows irregularly irregular rhythm consistent with afib.  --Continue ASA 81mg  daily  --Continue Carvedilol 12.5 mg --Continuous telemetry, monitor frequency of PVC's   Chronic Normocytic Anemia Hemoglobin 11.6 on admission and down to 8.5. Patient has history of normocytic anemia and takes iron supplements (BID). Likely due to combination of IDA and anemia of chronic disease.  Has history of small bowel w/ AVM's and reports dark stools 2/2 iron. FOBT shows no signs of internal bleed. --F/u CBC --Normal iron studies --Negative FOBT --Continue ferrous sulfate 325mg  once daily  Thrombocytopenia Patient with low platelet levels at recent hospital  admissions. Platelet 119 three weeks ago. Now in [place]. Likely 2/2 to chronic bleeding and chronic anemia but will consider hemolytic anemia on the differential due to elevated bilirubin. U/S abdomen does not show any splenomegaly.  --Daily CBC --Normal retic count, no splenomegaly --Follow up peripheral smear    CKD stage IIIb Creatinine 1.60, BUN 39, GFR 37 on admission.  Patient denies trouble urinating or other urinary symptoms. S/p 40mg  Lasix IV in ED. sCr improving. Now 1.64. Started on metolazone 2.5 mg daily --Continue Lasix 80mg  IV daily  --Continue Metolazone 2.5 mg daily --CMP    NIDDM type II Patient does not take her blood sugars at home. She endorses thirst and recent abdominal pain/nausea. She takes Glipizide 5mg  daily and Metformin 500mg  in the AM only. CBG elevated while inpatient. Started on home glipizide. Last HgbA1c in May 2021 was 6.1.  --Holding Metformin --Restarted glipizide 5 mg --Continue SSI with moderate correction coverage --Monitor CBGs   Hypertension Blood pressure well-controlled on home dose of carvedilol 12.5mg  BID. BP currently stable with sBP 100s-123s.  --Continue carvedilol 12.5mg  BID --Daily vitals   Osteoarthritis of the Knees States she takes Tylenol for arthritis PRN at home. No complaint of pain during admission. --Continue Tylenol 650mg  q6hrs PRN  --Continue PT eval   Gout, currently in remission Patient denies active gout flare currently. On allopurinol 100mg  BID at home. Uric acid elevated to 9.1. Repeat uric acid of 9.5. Treatment goal <6.5. --Continue allopurinol 100 mg BID --Will consider increasing dose of allopurinol  Prior to Admission Living Arrangement: Home Anticipated Discharge Location: Home Barriers to Discharge: Pending medical work up Dispo: Anticipated discharge in approximately 2-3 day(s).   Lacinda Axon, MD 07/22/2020, 6:52 AM Pager: [phone] Internal Medicine Teaching Service After 5pm on weekdays  and 1pm on weekends: On Call pager [phone]

## 2020-07-23 ENCOUNTER — Encounter (HOSPITAL_COMMUNITY): Payer: Self-pay | Admitting: Internal Medicine

## 2020-07-23 DIAGNOSIS — D649 Anemia, unspecified: Secondary | ICD-10-CM | POA: Diagnosis present

## 2020-07-23 DIAGNOSIS — D696 Thrombocytopenia, unspecified: Secondary | ICD-10-CM | POA: Diagnosis present

## 2020-07-23 LAB — GLUCOSE, CAPILLARY
Glucose-Capillary: 139 mg/dL — ABNORMAL HIGH (ref 70–99)
Glucose-Capillary: 174 mg/dL — ABNORMAL HIGH (ref 70–99)
Glucose-Capillary: 173 mg/dL — ABNORMAL HIGH (ref 70–99)
Glucose-Capillary: 206 mg/dL — ABNORMAL HIGH (ref 70–99)

## 2020-07-23 LAB — MAGNESIUM: Magnesium: 1.9 mg/dL (ref 1.7–2.4)

## 2020-07-23 LAB — BASIC METABOLIC PANEL
Anion gap: 11 (ref 5–15)
BUN: 31 mg/dL — ABNORMAL HIGH (ref 8–23)
CO2: 33 mmol/L — ABNORMAL HIGH (ref 22–32)
Calcium: 9.4 mg/dL (ref 8.9–10.3)
Chloride: 96 mmol/L — ABNORMAL LOW (ref 98–111)
Creatinine, Ser: 1.52 mg/dL — ABNORMAL HIGH (ref 0.44–1.00)
GFR calc Af Amer: 41 mL/min — ABNORMAL LOW (ref >60)
GFR calc non Af Amer: 35 mL/min — ABNORMAL LOW (ref >60)
Glucose, Bld: 121 mg/dL — ABNORMAL HIGH (ref 70–99)
Potassium: 3.6 mmol/L (ref 3.5–5.1)
Sodium: 140 mmol/L (ref 135–145)

## 2020-07-23 MED ORDER — POTASSIUM CHLORIDE CRYS ER 20 MEQ PO TBCR
40.0000 meq | EXTENDED_RELEASE_TABLET | Freq: Two times a day (BID) | ORAL | Status: DC
  Administered 2020-07-23 – 2020-07-26 (×7): 40 meq via ORAL
  Filled 2020-07-23 (×7): qty 2

## 2020-07-23 MED ORDER — GUAIFENESIN ER 600 MG PO TB12
600.0000 mg | ORAL_TABLET | Freq: Two times a day (BID) | ORAL | Status: DC | PRN
  Administered 2020-07-23: 600 mg via ORAL
  Filled 2020-07-23: qty 1

## 2020-07-23 MED ORDER — MAGNESIUM SULFATE 2 GM/50ML IV SOLN
2.0000 g | Freq: Once | INTRAVENOUS | Status: AC
  Administered 2020-07-23: 2 g via INTRAVENOUS

## 2020-07-23 MED ORDER — SODIUM CHLORIDE 0.9 % IV SOLN
INTRAVENOUS | Status: DC | PRN
  Administered 2020-07-23: 250 mL via INTRAVENOUS

## 2020-07-23 NOTE — Discharge Summary (Signed)
Name: Rita [name] MRN: [id number] DOB: [birth year] 64 y.o. PCP: [name], MD  Date of Admission: 07/19/2020  5:26 PM Date of Discharge: 07/26/2020 Attending Physician: Angelica Pou, MD  Discharge Diagnosis: 1. Acute on chronic combined systolic and diastolic heart failure  Discharge Medications: Allergies as of 07/26/2020      Reactions   Codeine Other (See Comments)   Mouth sores    Prednisone Other (See Comments)   Delusions    Moxifloxacin Rash      Medication List    STOP taking these medications   furosemide 40 MG tablet Commonly known as: LASIX     TAKE these medications   allopurinol 100 MG tablet Commonly known as: ZYLOPRIM Take 100 mg by mouth 2 (two) times daily.   aspirin EC 81 MG tablet Take 81 mg by mouth daily. Swallow whole.   blood glucose meter kit and supplies Dispense based on patient and insurance preference. Use up to four times daily as directed. (FOR ICD-10 E10.9, E11.9).   carvedilol 25 MG tablet Commonly known as: COREG Take 12.5 mg by mouth 2 (two) times daily with a meal.   ferrous sulfate 325 (65 FE) MG tablet Take 325 mg by mouth 2 (two) times daily with a meal.   glipiZIDE 5 MG 24 hr tablet Commonly known as: GLUCOTROL XL Take 5 mg by mouth daily with breakfast.   metFORMIN 500 MG 24 hr tablet Commonly known as: GLUCOPHAGE-XR Take 500 mg by mouth daily with breakfast.   OXYGEN Inhale 2 L into the lungs continuous.   pantoprazole 40 MG tablet Commonly known as: PROTONIX Take 40 mg by mouth daily.   potassium chloride SA 20 MEQ tablet Commonly known as: KLOR-CON Take 20 mEq by mouth daily.   torsemide 20 MG tablet Commonly known as: Demadex Take 1 tablet (20 mg total) by mouth daily.            Durable Medical Equipment  (From admission, onward)         Start     Ordered   07/25/20 1612  For home use only DME Walker rolling  Once       Question Answer Comment  Walker: With [place]  Wheels   Patient needs a walker to treat with the following condition Weakness      07/25/20 1611          Disposition and follow-up:   [name] was discharged from [facility] in Stable condition.  At the hospital follow up visit please address:  1. LE Edema: Patient w/ both HFpEF and HFrEF was discharged with some swelling in her legs. Provided with compression socks. Assess symptoms and adjust diuretic as necessary.  2. Blood pressure: Assess and modify medications if necessary. 3. Referral patient to a nutritionist to help with dietary changes (renal diet) 4. Thrombocytopenia: Patient had unexplained thrombocytopenia. Consider work up for this if still low in clinic.  4. Assess need for O2 therapy at home.   2.  Labs / imaging needed at time of follow-up: CBC, BMP, uric acid levels  3.  Pending labs/ test needing follow-up: None  Follow-up Appointments: [facility] on 08/02/20  Hospital Course by problem list:  Acute on Chronic Systolic and Diastolic Heart Failure Patient presented with 2 weeks of worsening SOB associated with dry cough, LE and abdominal swelling, weight gain, orthopnea, and PND. CXR showed pulmonary congestion with bilateral pleural effusions. BNP  620.4. Troponin I 156 >171. Given 63m Lasix IV in ED. ECHO showed EF of 35-40% w/ decreased LVSF, grade II diastolic dysfunction and mod RA & LA dilation. Weight on admission ~256lbs. Patient was initially treated with IV lasix 40 BID without much improvements in symptoms so metolazone 2.5 mg was added for a few days. A total net of negative 6.8L of fluid was removed and patient was down 10 lbs on discharge. Patient continued to make significant improvement in respiratory status since admission. Patient admitted while on Lasix 40 with instructions to take an extra dose with increased swelling. We will start patient on torsemide 20 mg on discharged and have new PCP reassess  and modify as necessary. Discharged home with Home health nurse to do assessments. [facility]   Acute on Chronic Hypoxic Respiratory Failure SpO2 was in the 80's on admission on her chronic 2L [place] oxygen that she wears 24/7 at home.CXR showed worsening pulmonary edema and bilateral pleural effusions. Likely 2/2 acute CHF exacerbation.  SOB resolved after diuresing her. Sats above 95% on home 2 L [place]. Discharged home with walker and home PT.  Chronic Atrial Fibrillation, Stable History of chronic afib on rate control. 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 Currently takes ASA 826mdaily. She denies any CP, dizziness, or palpitations. CV exam showed irregularly irregular rhythm consistent with afib.  Stable throughout hospitalization. Continued ASA 8129maily and Carvedilol 12.5 mg.  Chronic Normocytic Anemia Hemoglobin 11.6 on admission. Patient has history of normocytic anemia and takes iron supplements (BID). Likely due to combination of IDA and anemia of chronic disease. Has history of small bowel w/ AVM's and reports dark stools 2/2 iron.  Iron studies came back normal.  FOBT showed no signs of internal bleeding. Hemoglobin at discharge was 9.0. Continue ferrous sulfate 325 mg once daily and follow CBC at PCP.  Thrombocytopenia Patient with low platelet levels at recent hospital admissions. Platelet 119 three weeks ago. Platelets in mid-90s throughout hospitalization. Likely 2/2 chronic anemia but will consider hemolytic anemia on the differential due to elevated bilirubin. U/S abdomen did not show any evidence of splenomegaly. FOBT was negative for internal bleeding. Peripheral smear showed normocytic anemia and thrombocytopenia. Platelet started going up without any interventions. Platelet at discharge was 110. Follow up labs at PCP  CKD stage IIIb Creatinine  1.60, BUN 39, GFR 37 on admission.  Patient denied trouble urinating or other urinary symptoms. S/p 78m87msix IV in ED. Creatinine stable but slightly up (1.51-->1.59) on discharge. Patient weight was down 17 lbs from highest weight of 264 during the hospitalization to 246.5 lbs on discharge day. Her admission weight was 255 lbs. We continued KCl CR 40 mg PO daily at home. Follow up with BMP.  NIDDM type II Patient does not take her blood sugars at home. She endorsed thirst and recent abdominal pain/nausea. She takes Glipizide 5mg 51mly and Metformin 500mg 92mhe AM only. CBGelevated while inpatient. Started on home glipizide. Last HgbA1c in May 2021 was 6.1.  Last blood sugar was 111 on discharge day.  Resume Metformin at home. Consider nutrition referral as patient needs help understanding what to eat that has low sodium.   Hypertension Blood pressure was stable throughout hospitalization. BP was well-controlled on home dose of carvedilol 12.5mg BI51m Osteoarthritis of the Knees States she takes Tylenol for arthritis PRN at  home. No complaint of pain during admission. Continued OTC Tylenol 675m q6hrs PRN. She was discharged home with home PT. Patient was in the process of getting a walker.   Gout, currently in remission Patient denied active gout flare on admission. On allopurinol 1055mBID at home. Uric acid elevated to 9.1. Repeat uric acid of 9.5. Treatment goal <6.5. We continued home allopurinol 100 mg BID.   Discharge Vitals:   BP 118/60 (BP Location: Left Arm)   Pulse (!) 56   Temp 97.6 F (36.4 C) (Oral)   Resp 20   Ht _0  (1.549 m)   Wt 117.3 kg   SpO2 97%   BMI 48.86 kg/m   Pertinent Labs, Studies, and Procedures:  BMP Latest Ref Rng & Units 07/26/2020 07/25/2020 07/24/2020  Glucose 70 - 99 mg/dL 163(H) 149(H) 208(H)  BUN 8 - 23 mg/dL 28(H) 27(H) 28(H)  Creatinine 0.44 - 1.00 mg/dL 1.59(H) 1.51(H) 1.53(H)  Sodium 135 - 145 mmol/L 139 140 139  Potassium 3.5 - 5.1  mmol/L 4.3 3.9 3.8  Chloride 98 - 111 mmol/L 91(L) 92(L) 93(L)  CO2 22 - 32 mmol/L 39(H) 39(H) 37(H)  Calcium 8.9 - 10.3 mg/dL 9.2 9.4 9.5   CBC Latest Ref Rng & Units 07/26/2020 07/25/2020 07/24/2020  WBC 4.0 - 10.5 K/uL 4.5 4.6 5.0  Hemoglobin 12.0 - 15.0 g/dL 9.0(L) 9.1(L) 8.8(L)  Hematocrit 36 - 46 % 28.3(L) 29.0(L) 28.1(L)  Platelets 150 - 400 K/uL 110(L) 108(L) 103(L)     Discharge Instructions:   Mrs. HoHummit was a pleasure caring for you at the hospital.  You have made significant improvements in your breathing, swelling and oxygen levels.  We have set you up with home health so they will come back weekly to do an assessment and ensure you are continuing to make progress.  We have changed your fluid pill from Lasix to torsemide 20 mg once a day. Make sure to keep your compression socks on during the day and elevate your feet at night.  Take all your other medicines as prescribed.  We have also schedule an appointment for you at our internal medicine clinic on 08/02/2020 @ 9:15am with Dr. KrSherry Ruffing They will check labs and make any necessary changes to your medications.  They will also have you see a nutritionist who will counsel you on the right type of food to eat in order to decrease your sodium intake.  Do not hesitate to let usKoreanow if you have any questions or concerns about your care.  Take care, Dr. AmCoy Saunas Signed: AmLacinda AxonMD 07/23/2020, 6:18 AM   Pager: [phone] Medicine Teaching Service

## 2020-07-23 NOTE — Progress Notes (Signed)
Subjective:   No acute events overnight. She woke up this morning with some abdominal discomfort which she described as the sensation that she had already eaten a meal. Moved her bowels yesterday after receiving stool softener. Describe a small tickle in her throat and feels like she cannot clear mucous. This is a chronic problem that she usually treats with a decongestant. She is otherwise diuresing well. Her SOB has improved but she still gets out of breath when she exerts herself too much.   Objective:  Vital signs in last 24 hours: Vitals:   07/22/20 0902 07/22/20 1602 07/22/20 1929 07/23/20 0411  BP: (!) 122/58 125/66 133/79 118/60  Pulse: 78 90 74 (!) 56  Resp:  18 18 20   Temp:  97.6 F (36.4 C) 97.8 F (36.6 C) 97.6 F (36.4 C)  TempSrc:  Oral Oral Oral  SpO2:   100% 97%  Weight:    117.3 kg  Height:        General: Obese, elderly woman sitting comfortably in her chair. No acute distress. Eyes: Sclera non-icteric.  Head: [place]/AT Resp/Chest: On 2L satting around 97-98%. Lungs CTAB. Bilateral rales in the bases, improving. Normal effort. Venous congestion in upper anterior chest CV: Normal rate. Rhythm is irregularly irregular. +JVD. There is a 2/6 blowing systolic murmur heard best along the LUSB. 1+ pitting edema, to bilateral thighs with chronic venous stasis changes. Abdominal: Soft and distended with fluid collection, improving. Indurated skin over lower abdomen, resolving with diuresing. Bowel sounds intact. No tenderness to palpation Skin: Ecchymoses on anterior L lower abdominal wall. Hyperpigmented anterior shins, bilaterally. No rashes or lesions. Neuro: A&Ox3. Moves all extremities. Sensation intact Psych: Normal mood and affect.  Assessment/Plan:   Principal Problem:   Acute on chronic combined systolic and diastolic heart failure (HCC) Active Problems:   CKD (chronic kidney disease), stage III   Acute respiratory failure with hypoxia (HCC)   Acute  exacerbation of CHF (congestive heart failure) (HCC)  Acute on Chronic Systolic and Diastolic Heart Failure Patient presented with 2 weeks of worsening SOB associated with dry cough, LE and abdominal swelling, weight gain, orthopnea, and PND. CXR shows pulmonary congestion with bilateral pleural effusions. BNP 620.4. Troponin I 156 > 171. S/p 40mg  Lasix IV in ED. ECHO shows EF of 35-40% w/ decreased LVSF, grade II diastolic dysfunction and mod RA & LA dilation. Dry weight is ~256lbs and patient is down 6 lbs to 258 lbs. I/O neg -1.5 L overnight. SOB improved but continues to be volume overloaded on exam. --Continue Lasix 80mg  IV BID --Continue Metolazone 2.5 mg daily --Continue KCl CR 20mg  PO daily  --Normal lipid panel  --Normal Mg2+, Phos --Daily weights  --Strict I&O's   Acute on Chronic Hypoxic Respiratory Failure SpO2 was in the 80's on admission on her chronic 2L [place] oxygen that she wears 24/7 at home.CXR shows worsening pulmonary edema and bilateral pleural effusions. Likely 2/2 acute CHF exacerbation. LBBB and pulmonary HTN noted in previous cardiology notes may be contributing factors. Still with mild DOE but SOB improved. Sats around 96-98% on 2LNC --Maintain SpO2 > 92%  --Continuous pulse oximetry   Chronic Atrial Fibrillation, Stable History of chronic afib on rate control. 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 Currently takes ASA 81mg  daily. She denies any CP, dizziness, or palpitations. CV exam shows irregularly irregular rhythm consistent with afib. No issues today. --Continue ASA  81mg  daily  --Continue Carvedilol 12.5 mg --Continuous telemetry, monitor frequency of PVC's   Chronic Normocytic Anemia Hemoglobin 11.6 on admission and down to 8.5. Patient has history of normocytic anemia and takes iron supplements (BID). Likely due to combination of IDA and anemia of chronic  disease. Has history of small bowel w/ AVM's and reports dark stools 2/2 iron. FOBT shows no signs of internal bleed. --F/u CBC --Normal iron studies --Negative FOBT --Continue ferrous sulfate 325mg  once daily  Thrombocytopenia Patient with low platelet levels at recent hospital admissions. Platelet 119 three weeks ago. Now in [place]. Likely 2/2 chronic anemia but will consider hemolytic anemia on the differential due to elevated bilirubin. U/S abdomen does not show any splenomegaly. FOBT negative for bleeding. --Daily CBC --Normal retic count, no splenomegaly --Follow up peripheral smear    CKD stage IIIb Creatinine 1.60, BUN 39, GFR 37 on admission.  Patient denies trouble urinating or other urinary symptoms. S/p 40mg  Lasix IV in ED. sCr improving. Now down to1.52. Added metolazone 2.5 mg daily on 8/13 --Continue Lasix 80mg  IV daily  --Continue Metolazone 2.5 mg daily --CMP    NIDDM type II Patient does not take her blood sugars at home. She endorses thirst and recent abdominal pain/nausea. She takes Glipizide 5mg  daily and Metformin 500mg  in the AM only. CBG elevated while inpatient. Started on home glipizide. Last HgbA1c in May 2021 was 6.1.  --Holding Metformin --Restarted glipizide 5 mg --Continue SSI with moderate correction coverage --Monitor CBGs   Hypertension Blood pressure well-controlled on home dose of carvedilol 12.5mg  BID. BP currently stable with sBP 100s-123s.  --Continue carvedilol 12.5mg  BID --Daily vitals   Osteoarthritis of the Knees States she takes Tylenol for arthritis PRN at home. No complaint of pain during admission. --Continue Tylenol 650mg  q6hrs PRN  --Continue PT eval   Gout, currently in remission Patient denies active gout flare currently. On allopurinol 100mg  BID at home. Uric acid elevated to 9.1. Repeat uric acid of 9.5. Treatment goal <6.5. --Continue allopurinol 100 mg BID --Will consider increasing dose of allopurinol  Prior to Admission  Living Arrangement: Home Anticipated Discharge Location: Home Barriers to Discharge: Pending medical work up Dispo: Anticipated discharge in approximately 1-2 day(s).   Lacinda Axon, MD 07/23/2020, 6:12 AM Pager: [phone] Internal Medicine Teaching Service After 5pm on weekdays and 1pm on weekends: On Call pager [phone]

## 2020-07-24 DIAGNOSIS — R109 Unspecified abdominal pain: Secondary | ICD-10-CM

## 2020-07-24 LAB — GLUCOSE, CAPILLARY
Glucose-Capillary: 169 mg/dL — ABNORMAL HIGH (ref 70–99)
Glucose-Capillary: 143 mg/dL — ABNORMAL HIGH (ref 70–99)
Glucose-Capillary: 164 mg/dL — ABNORMAL HIGH (ref 70–99)
Glucose-Capillary: 175 mg/dL — ABNORMAL HIGH (ref 70–99)

## 2020-07-24 LAB — BASIC METABOLIC PANEL
Anion gap: 9 (ref 5–15)
BUN: 28 mg/dL — ABNORMAL HIGH (ref 8–23)
CO2: 37 mmol/L — ABNORMAL HIGH (ref 22–32)
Calcium: 9.5 mg/dL (ref 8.9–10.3)
Chloride: 93 mmol/L — ABNORMAL LOW (ref 98–111)
Creatinine, Ser: 1.53 mg/dL — ABNORMAL HIGH (ref 0.44–1.00)
GFR calc Af Amer: 41 mL/min — ABNORMAL LOW (ref >60)
GFR calc non Af Amer: 35 mL/min — ABNORMAL LOW (ref >60)
Glucose, Bld: 208 mg/dL — ABNORMAL HIGH (ref 70–99)
Potassium: 3.8 mmol/L (ref 3.5–5.1)
Sodium: 139 mmol/L (ref 135–145)

## 2020-07-24 LAB — CBC
HCT: 28.1 % — ABNORMAL LOW (ref 36.0–46.0)
Hemoglobin: 8.8 g/dL — ABNORMAL LOW (ref 12.0–15.0)
MCH: 26 pg (ref 26.0–34.0)
MCHC: 31.3 g/dL (ref 30.0–36.0)
MCV: 82.9 fL (ref 80.0–100.0)
Platelets: 103 10*3/uL — ABNORMAL LOW (ref 150–400)
RBC: 3.39 MIL/uL — ABNORMAL LOW (ref 3.87–5.11)
RDW: 17.2 % — ABNORMAL HIGH (ref 11.5–15.5)
WBC: 5 10*3/uL (ref 4.0–10.5)
nRBC: 0 % (ref 0.0–0.2)

## 2020-07-24 LAB — PATHOLOGIST SMEAR REVIEW

## 2020-07-24 LAB — HEPATITIS B SURFACE ANTIBODY, QUANTITATIVE: Hep B S AB Quant (Post): <3.1 m[IU]/mL — ABNORMAL LOW (ref >9.9)

## 2020-07-24 MED ORDER — FERROUS SULFATE 325 (65 FE) MG PO TABS
325.0000 mg | ORAL_TABLET | Freq: Every day | ORAL | Status: DC
  Administered 2020-07-25 – 2020-07-26 (×2): 325 mg via ORAL
  Filled 2020-07-24 (×2): qty 1

## 2020-07-24 MED ORDER — FUROSEMIDE 10 MG/ML IJ SOLN
40.0000 mg | Freq: Two times a day (BID) | INTRAMUSCULAR | Status: DC
  Administered 2020-07-24 – 2020-07-26 (×4): 40 mg via INTRAVENOUS
  Filled 2020-07-24 (×4): qty 4

## 2020-07-24 MED ORDER — GLIPIZIDE ER 5 MG PO TB24
5.0000 mg | ORAL_TABLET | Freq: Every day | ORAL | Status: DC
  Administered 2020-07-25 – 2020-07-26 (×2): 5 mg via ORAL
  Filled 2020-07-24 (×2): qty 1

## 2020-07-24 NOTE — Progress Notes (Signed)
Subjective:   No acute events overnight. [name] continues to feel better with getting fluid off. She feels immensely better compared to when she came in, 98%. Still having some dyspnea with exertion. Stomach is much softer than when she came in. States she was able to walk down the hall. She would like help figuring out what to eat so she does not increase her sodium intake on accident.   Objective:  Vital signs in last 24 hours: Vitals:   07/23/20 1139 07/23/20 1916 07/24/20 0208 07/24/20 0351  BP: 125/64 128/69  117/77  Pulse: 68 (!) 45  70  Resp: 20 18  18   Temp: 98.6 F (37 C) 98.3 F (36.8 C)  98.2 F (36.8 C)  TempSrc: Oral Oral  Oral  SpO2: 97% 98%  96%  Weight:   113.4 kg   Height:        General: Obese, pleasant woman laying in her chair. No acute distress Eyes: Sclera non-icteric.  Head: [place]/AT Resp/Chest: On 2L with sats in 96-98%. Lungs CTAB. Mild rales on lung bases. Normal effort. Venous congestion in upper anterior chest CV: Normal rate. Rhythm is irregularly irregular. +JVD to the mid neck, improved. There is a 2/6 blowing systolic murmur heard best along the LUSB. 1+ pitting edema, to bilateral thighs with chronic venous stasis changes. Compression socks on both legs.  Abdominal: Soft and non-tender. Mild distension with fluid collection that is resolving. Indurated skin over lower abdomen, improving with diuresing. Bowel sounds intact.  Skin: Ecchymoses on anterior L lower abdominal wall. Hyperpigmented anterior shins, bilaterally. No rashes or lesions. Neuro: A&Ox3. Moves all extremities. Sensation intact Psych: Normal mood and affect.  Assessment/Plan:   Principal Problem:   Acute on chronic combined systolic and diastolic heart failure (HCC) Active Problems:   Diabetes mellitus without complication (HCC)   AF (paroxysmal atrial fibrillation) (HCC)   CKD (chronic kidney disease), stage III   Acute respiratory failure with hypoxia (HCC)    Thrombocytopenia (HCC)   Anemia  Acute on Chronic Systolic and Diastolic Heart Failure Patient presented with 2 weeks of worsening SOB associated with dry cough, LE and abdominal swelling, weight gain, orthopnea, and PND. CXR shows pulmonary congestion with bilateral pleural effusions. BNP 620.4. Troponin I 156 > 171. S/p 40mg  Lasix IV in ED. ECHO shows EF of 35-40% w/ decreased LVSF, grade II diastolic dysfunction and mod RA & LA dilation. Weight on admission ~256lbs. Patient is now down to 250 lbs. I/O neg -1.8 L overnight. SOB and abdominal edema improved but patient continues to have mild DOE.  --Reduce Lasix 80mg  IV BID to 40 mg IV BID --Discontinue Metolazone 2.5 mg daily --Continue KCl CR 40 mg PO daily  --Normal lipid panel  --Normal Mg2+, Phos --Daily weights  --Strict I&O's   Acute on Chronic Hypoxic Respiratory Failure SpO2 was in the 80's on admission on her chronic 2L [place] oxygen that she wears 24/7 at home.CXR shows worsening pulmonary edema and bilateral pleural effusions. Likely 2/2 acute CHF exacerbation. SOB significantly improved, but still with mild DOE. Sats around 96-98% on 2LNC --Maintain SpO2 > 92%  --Continuous pulse oximetry   Chronic Atrial Fibrillation, Stable History of chronic afib on rate control. 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 Currently takes ASA 81mg  daily. She denies any CP, dizziness, or palpitations. CV exam shows irregularly irregular rhythm consistent with afib. No major changes  today. --Continue ASA 81mg  daily  --Continue Carvedilol 12.5 mg --Continuous telemetry, monitor frequency of PVC's   Chronic Normocytic Anemia Hemoglobin 11.6 on admission. Patient has history of normocytic anemia and takes iron supplements (BID). Likely due to combination of IDA and anemia of chronic disease. Has history of small bowel w/ AVM's and reports dark stools  2/2 iron. FOBT shows no signs of internal bleed. Hgb trending up from 8.5 to 8.8 --F/u CBC --Normal iron studies --Negative FOBT --Continue ferrous sulfate 325 mg once daily  Thrombocytopenia Patient with low platelet levels at recent hospital admissions. Platelet 119 three weeks ago. Now in [place]. Likely 2/2 chronic anemia but will consider hemolytic anemia on the differential due to elevated bilirubin. U/S abdomen does not show any splenomegaly. FOBT negative for bleeding. Platelets now trending up 95-->103.  --Daily CBC --Normal retic count, no splenomegaly --Peripheral smear shows normocytic anemia and thrombocytopenia   CKD stage IIIb Creatinine 1.60, BUN 39, GFR 37 on admission.  Patient denies trouble urinating or other urinary symptoms. S/p 40mg  Lasix IV in ED. sCr stable but starting to trend up (1.52-->1.53). Discontinued metolazone 2.5 mg daily and decreased dose of lasix as patient has been down 14 lbs from highest weight of 264 to 250 lbs today. --Reduce Lasix to 40 mg IV BID --Discontinue Metolazone 2.5 mg daily --CMP    NIDDM type II Patient does not take her blood sugars at home. She endorses thirst and recent abdominal pain/nausea. She takes Glipizide 5mg  daily and Metformin 500mg  in the AM only. CBG elevated while inpatient. Started on home glipizide. Last HgbA1c in May 2021 was 6.1.  --Holding Metformin --Continue glipizide 5 mg --Continue SSI with moderate correction coverage --Monitor CBGs   Hypertension Blood pressure well-controlled on home dose of carvedilol 12.5mg  BID. BP stable.   --Continue carvedilol 12.5mg  BID --Daily vitals   Osteoarthritis of the Knees States she takes Tylenol for arthritis PRN at home. No complaint of pain during admission. --Continue Tylenol 650mg  q6hrs PRN  --Continue PT eval   Gout, currently in remission Patient denies active gout flare currently. On allopurinol 100mg  BID at home. Uric acid elevated to 9.1. Repeat uric acid of  9.5. Treatment goal <6.5. --Continue allopurinol 100 mg BID  Prior to Admission Living Arrangement: Home Anticipated Discharge Location: Home Barriers to Discharge: Pending diuresing Dispo: Anticipated discharge in approximately 1-2 day(s).   Lacinda Axon, MD 07/24/2020, 6:17 AM Pager: [phone] Internal Medicine Teaching Service After 5pm on weekdays and 1pm on weekends: On Call pager [phone]

## 2020-07-24 NOTE — Progress Notes (Signed)
Physical Therapy Treatment Patient Details Name: Rita [name] MRN: [id number] DOB: 03/12/1956 Today's Date: 07/24/2020    History of Present Illness 64 yo female with LE edema and SOB was noted to have CHF.  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, pulmonary HTN, persistent Afib, and diastolic HF on supplmental O2 prn    PT Comments    Pt admitted with above diagnosis. Pt was able to ambulate with min guard assist with RW with incr distance today.  Overall good safety.  Pt does need assist to power up from chair. Husband present.  Pt currently with functional limitations due to balance and endurance deficits. Pt on 2L at rest with sats 90% and needed 4L to keep sats >88% with ambulation. Pt will benefit from skilled PT to increase their independence and safety with mobility to allow discharge to the venue listed below.     Follow Up Recommendations  Home health PT;Supervision for mobility/OOB     Equipment Recommendations  Rolling walker with 5" wheels    Recommendations for Other Services       Precautions / Restrictions Precautions Precautions: Fall Precaution Comments: monitor O2 sats Restrictions Weight Bearing Restrictions: No    Mobility  Bed Mobility Overal bed mobility: Needs Assistance Bed Mobility: Sit to Supine       Sit to supine: Min assist   General bed mobility comments: min assit for LEs into bed.   Transfers Overall transfer level: Needs assistance Equipment used: Rolling walker (2 wheeled);1 person hand held assist Transfers: Sit to/from Stand Sit to Stand: Min assist;Min guard         General transfer comment: Pt in chair on arrival. She urinated in [place] prior to getting up and the purewick malfunctioned getting her chair linens soiled but PT changed pts linens and cleaned her.  Pt was min to power up on walker   Ambulation/Gait Ambulation/Gait assistance: Min guard Gait Distance (Feet): 110 Feet Assistive  device: Rolling walker (2 wheeled) Gait Pattern/deviations: Step-to pattern;Wide base of support;Decreased stride length;Trunk flexed;Drifts right/left Gait velocity: reduced Gait velocity interpretation: <1.31 ft/sec, indicative of household ambulator General Gait Details: Pt was able to progress ambulation with overall good safety with RW.  Occasional cues and assist to steer RW.    Stairs             Wheelchair Mobility    Modified Rankin (Stroke Patients Only)       Balance Overall balance assessment: Needs assistance Sitting-balance support: Feet supported Sitting balance-Leahy Scale: Fair     Standing balance support: Bilateral upper extremity supported;During functional activity Standing balance-Leahy Scale: Poor Standing balance comment: Pt relies on UE supporrt                            Cognition Arousal/Alertness: Awake/alert Behavior During Therapy: WFL for tasks assessed/performed Overall Cognitive Status: Within Functional Limits for tasks assessed                                        Exercises General Exercises - Lower Extremity Ankle Circles/Pumps: AROM;Both;10 reps;Seated Long Arc Quad: AROM;Both;10 reps;Seated Hip Flexion/Marching: AROM;Both;10 reps;Seated    General Comments        Pertinent Vitals/Pain Pain Assessment: No/denies pain    Home Living  Prior Function            PT Goals (current goals can now be found in the care plan section) Acute Rehab PT Goals Patient Stated Goal: to get stronger and get home safe Progress towards PT goals: Progressing toward goals    Frequency    Min 3X/week      PT Plan Current plan remains appropriate    Co-evaluation              AM-PAC PT "6 Clicks" Mobility   Outcome Measure  Help needed turning from your back to your side while in a flat bed without using bedrails?: A Little Help needed moving from lying on your  back to sitting on the side of a flat bed without using bedrails?: A Little Help needed moving to and from a bed to a chair (including a wheelchair)?: A Little Help needed standing up from a chair using your arms (e.g., wheelchair or bedside chair)?: A Little Help needed to walk in hospital room?: A Little Help needed climbing 3-5 steps with a railing? : A Lot 6 Click Score: 17    End of Session Equipment Utilized During Treatment: Oxygen;Gait belt Activity Tolerance: Patient limited by fatigue Patient left: with call bell/phone within reach;with family/visitor present;in bed (sitting on EOB) Nurse Communication: Mobility status PT Visit Diagnosis: Unsteadiness on feet (R26.81);Muscle weakness (generalized) (M62.81);Difficulty in walking, not elsewhere classified (R26.2)     Time: 1102-1117 PT Time Calculation (min) (ACUTE ONLY): 24 min  Charges:  $Gait Training: 8-22 mins $Self Care/Home Management: 8-22                     Danaiya Steadman W,PT [place] Pager:  [phone]  Office:  [place] 07/24/2020, 4:27 PM

## 2020-07-25 ENCOUNTER — Encounter (HOSPITAL_COMMUNITY): Payer: Self-pay | Admitting: Internal Medicine

## 2020-07-25 LAB — BASIC METABOLIC PANEL
Anion gap: 9 (ref 5–15)
BUN: 27 mg/dL — ABNORMAL HIGH (ref 8–23)
CO2: 39 mmol/L — ABNORMAL HIGH (ref 22–32)
Calcium: 9.4 mg/dL (ref 8.9–10.3)
Chloride: 92 mmol/L — ABNORMAL LOW (ref 98–111)
Creatinine, Ser: 1.51 mg/dL — ABNORMAL HIGH (ref 0.44–1.00)
GFR calc Af Amer: 41 mL/min — ABNORMAL LOW (ref >60)
GFR calc non Af Amer: 36 mL/min — ABNORMAL LOW (ref >60)
Glucose, Bld: 149 mg/dL — ABNORMAL HIGH (ref 70–99)
Potassium: 3.9 mmol/L (ref 3.5–5.1)
Sodium: 140 mmol/L (ref 135–145)

## 2020-07-25 LAB — GLUCOSE, CAPILLARY
Glucose-Capillary: 143 mg/dL — ABNORMAL HIGH (ref 70–99)
Glucose-Capillary: 178 mg/dL — ABNORMAL HIGH (ref 70–99)
Glucose-Capillary: 204 mg/dL — ABNORMAL HIGH (ref 70–99)
Glucose-Capillary: 153 mg/dL — ABNORMAL HIGH (ref 70–99)

## 2020-07-25 LAB — CBC
HCT: 29 % — ABNORMAL LOW (ref 36.0–46.0)
Hemoglobin: 9.1 g/dL — ABNORMAL LOW (ref 12.0–15.0)
MCH: 26.8 pg (ref 26.0–34.0)
MCHC: 31.4 g/dL (ref 30.0–36.0)
MCV: 85.3 fL (ref 80.0–100.0)
Platelets: 108 10*3/uL — ABNORMAL LOW (ref 150–400)
RBC: 3.4 MIL/uL — ABNORMAL LOW (ref 3.87–5.11)
RDW: 17.2 % — ABNORMAL HIGH (ref 11.5–15.5)
WBC: 4.6 10*3/uL (ref 4.0–10.5)
nRBC: 0 % (ref 0.0–0.2)

## 2020-07-25 NOTE — TOC Initial Note (Signed)
Transition of Care [facility]) - Initial/Assessment Note    Patient Details  Name: Rita [name] MRN: [id number] Date of Birth: 1956/02/11  [facility] CM/SW Contact:    Zenon Mayo, RN Phone Number: 07/25/2020, 4:09 PM  Clinical Narrative:                 Patient is from home with spouse, she is active with [facility] for HHPT, she will need a rolling walker at dc.  NCM notified [place] with [facility] of possible dc tomorrow, will need HHPT order and rolling walker order.  NCM made referral to [facility] with [employer] for rolling walker.  Expected Discharge Plan: [place] Barriers to Discharge: Continued Medical Work up   Patient Goals and CMS Choice Patient states their goals for this hospitalization and ongoing recovery are:: get better and do the things she used to do CMS Medicare.gov Compare Post Acute Care list provided to:: Patient Choice offered to / list presented to : Patient  Expected Discharge Plan and Services Expected Discharge Plan: [place]   Discharge Planning Services: CM Consult Post Acute Care Choice: Home Health, Resumption of Svcs/PTA Provider Living arrangements for the past 2 months: Single Family Home                 DME Arranged: Walker rolling DME Agency: [employer] Date DME Agency Contacted: 07/25/20 Time DME Agency Contacted: [phone] Representative spoke with at DME Agency: zach HH Arranged: PT [place]: [facility] Date [place] Agency Contacted: 07/25/20 Time HH Agency Contacted: 32 Representative spoke with at [place]: zach  Prior Living Arrangements/Services Living arrangements for the past 2 months: [place] with:: Spouse Patient language and need for interpreter reviewed:: Yes Do you feel safe going back to the place where you live?: Yes      Need for Family Participation in Patient Care: Yes (Comment) Care giver support system in place?: Yes (comment) Current  home services: Home PT Criminal Activity/Legal Involvement Pertinent to Current Situation/Hospitalization: No - Comment as needed  Activities of Daily Living Home Assistive Devices/Equipment: Dentures (specify type), Cane (specify quad or straight), Walker (specify type), Oxygen ADL Screening (condition at time of admission) Patient's cognitive ability adequate to safely complete daily activities?: Yes Is the patient deaf or have difficulty hearing?: No Does the patient have difficulty seeing, even when wearing glasses/contacts?: No Does the patient have difficulty concentrating, remembering, or making decisions?: No Patient able to express need for assistance with ADLs?: Yes Does the patient have difficulty dressing or bathing?: No Independently performs ADLs?: Yes (appropriate for developmental age) Does the patient have difficulty walking or climbing stairs?: Yes Weakness of Legs: Both Weakness of Arms/Hands: None  Permission Sought/Granted                  Emotional Assessment   Attitude/Demeanor/Rapport: Engaged Affect (typically observed): Appropriate Orientation: : Oriented to  Time, Oriented to Situation, Oriented to Place, Oriented to Self Alcohol / Substance Use: Not Applicable Psych Involvement: No (comment)  Admission diagnosis:  Shortness of breath [R06.02] Leg swelling [M79.89] Acute exacerbation of CHF (congestive heart failure) ([place]) [I50.9] Patient Active Problem List   Diagnosis Date Noted  . Thrombocytopenia ([place]) 07/23/2020  . Anemia 07/23/2020  . Acute on chronic combined systolic and diastolic heart failure ([place]) 07/21/2020  . Goiter   . Morbid obesity ([place]) 06/19/2020  . Neck mass 06/18/2020  . Acute respiratory failure with hypoxia (  [place]) 06/16/2020  . Diabetes mellitus without complication ([place])   . AF (paroxysmal atrial fibrillation) ([place])   . CKD (chronic kidney disease), stage III   . Hypertension associated with diabetes ([place])    PCP:   [name], MD Pharmacy:   [facility] [place], [address] AT [place] [address] [place] [address] Phone: [phone] Fax: [phone]     Social Determinants of Health (SDOH) Interventions Social Connections Interventions: Intervention Not Indicated Transportation Interventions: Intervention Not Indicated  Readmission Risk Interventions Readmission Risk Prevention Plan 07/25/2020  Transportation Screening Complete  PCP or Specialist Appt within 3-5 Days Complete  HRI or Home Care Consult Complete  Social Work Consult for [place] Planning/Counseling Complete  Palliative Care Screening Not Applicable  Medication Review Press photographer) Complete  Some recent data might be hidden

## 2020-07-25 NOTE — Progress Notes (Signed)
Subjective:   No acute events overnight. Patient was evaluated at the bedside. States she is doing even better today. She still have some DOE but her SOB has resolved and her edema is significantly improved. Patient was informed that we will likely discharge her tomorrow. We plan to see how she does off the O2 today. We also plan on getting her an appointment at our internal medicine clinic.  Objective:  Vital signs in last 24 hours: Vitals:   07/24/20 0822 07/24/20 1145 07/24/20 1939 07/25/20 0351  BP: 114/60 94/78 (!) 122/57 139/69  Pulse: 67 60 72 74  Resp: 16 18 18 18   Temp: 98.4 F (36.9 C) 98.3 F (36.8 C) 98.2 F (36.8 C) 98.7 F (37.1 C)  TempSrc: Oral Oral Oral Oral  SpO2:  100% 96% 96%  Weight:    112.2 kg  Height:        General: Obese, pleasant woman laying in her chair. No acute distress Eyes: Sclera non-icteric.  Head: [place]/AT Resp/Chest: On 2LNC and sats in 96-99%. Lungs CTAB. Still has rales in her lung bases. Normal effort. CV: Normal rate. Rhythm is irregularly irregular. +JVD to the mid neck, improved. There is a 2/6 blowing systolic murmur heard best along the LUSB. 1+ pitting edema, to bilateral thighs with chronic venous stasis changes. Compression socks on both legs.  Abdominal: Soft and non-tender. Fluid collection in abdomen resolving. Indurated skin over lower abdomen, improving with diuresing. Bowel sounds intact.  Skin: Ecchymoses on anterior L lower abdominal wall. Hyperpigmented anterior shins, bilaterally. No rashes or lesions. Neuro: A&Ox3. Moves all extremities. Sensation intact Psych: Normal mood and affect.  Assessment/Plan:   Principal Problem:   Acute on chronic combined systolic and diastolic heart failure (HCC) Active Problems:   Diabetes mellitus without complication (HCC)   AF (paroxysmal atrial fibrillation) (HCC)   CKD (chronic kidney disease), stage III   Acute respiratory failure with hypoxia (HCC)   Thrombocytopenia (HCC)    Anemia  Acute on Chronic Systolic and Diastolic Heart Failure Patient presented with 2 weeks of worsening SOB associated with dry cough, LE and abdominal swelling, weight gain, orthopnea, and PND. CXR shows pulmonary congestion with bilateral pleural effusions. BNP 620.4. Troponin I 156 >171. S/p 40mg  Lasix IV in ED. ECHO shows EF of 35-40% w/ decreased LVSF, grade II diastolic dysfunction and mod RA & LA dilation. Weight on admission ~256lbs. Weight down to 247 lbs from max of 264 during hospitalization. I/O neg -685 cc overnight. States she is feeling much better today. Plan to trial patient off O2 to assess ability to function w/o O2 at home. Plan to discharge pt on lasix 40 mg BID and have her follow up with [facility] next week.  --Continue lasix 40 mg IV BID --Continue KCl CR 40 mg PO daily  --Normal lipid panel  --Normal Mg2+, Phos --Daily weights  --Strict I&O's   Acute on Chronic Hypoxic Respiratory Failure SpO2 was in the 80's on admission on her chronic 2L [place] oxygen that she wears 24/7 at home.CXR shows worsening pulmonary edema and bilateral pleural effusions. Likely 2/2 acute CHF exacerbation. SOB significantly improved, but still with mild DOE. Sats around 96-99% on 2LNC --Maintain SpO2 > 92%  --Continuous pulse oximetry   Chronic Atrial Fibrillation, Stable History of chronic afib on rate control. 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 Currently takes ASA 81mg  daily. She denies any  CP, dizziness, or palpitations. CV exam shows irregularly irregular rhythm consistent with afib. No major changes today. --Continue ASA 81mg  daily  --Continue Carvedilol 12.5 mg --Continuous telemetry, monitor frequency of PVC's   Chronic Normocytic Anemia Hemoglobin 11.6 on admission. Patient has history of normocytic anemia and takes iron supplements (BID). Likely due to combination of IDA and anemia of  chronic disease. Has history of small bowel w/ AVM's and reports dark stools 2/2 iron. FOBT shows no signs of internal bleed. Hgb improving 8.8 -->9.1 --Daily --Normal iron studies --Negative FOBT --Continue ferrous sulfate 325 mg once daily  Thrombocytopenia Patient with low platelet levels at recent hospital admissions. Platelet 119 three weeks ago. Now in [place]. Likely 2/2 chronic anemia but will consider hemolytic anemia on the differential due to elevated bilirubin. U/S abdomen does not show any splenomegaly. FOBT negative for bleeding. Platelets going up 103 -->108 --Daily CBC --Normal retic count, no splenomegaly --Peripheral smear shows normocytic anemia and thrombocytopenia   CKD stage IIIb Creatinine 1.60, BUN 39, GFR 37 on admission.  Patient denies trouble urinating or other urinary symptoms. S/p 40mg  Lasix IV in ED. sCr is currently stable (1.53-->1.51). Patient weight is down 17 lbs from highest weight of 264 to 247 lbs today. --Continue lasix 40 mg IV BID --CMP    NIDDM type II Patient does not take her blood sugars at home. She endorses thirst and recent abdominal pain/nausea. She takes Glipizide 5mg  daily and Metformin 500mg  in the AM only. CBG elevated while inpatient. Started on home glipizide. Last HgbA1c in May 2021 was 6.1.  --Holding Metformin --Continue glipizide 5 mg --Continue SSI with moderate correction coverage --Monitor CBGs   Hypertension Blood pressure well-controlled on home dose of carvedilol 12.5mg  BID. BP stable.   --Continue carvedilol 12.5mg  BID --Daily vitals   Osteoarthritis of the Knees States she takes Tylenol for arthritis PRN at home. No complaint of pain during admission. --Continue Tylenol 650mg  q6hrs PRN  --Continue PT eval   Gout, currently in remission Patient denies active gout flare currently. On allopurinol 100mg  BID at home. Uric acid elevated to 9.1. Repeat uric acid of 9.5. Treatment goal <6.5. --Continue allopurinol 100 mg  BID  Prior to Admission Living Arrangement: Home Anticipated Discharge Location: Home Barriers to Discharge: Pending diuresing Dispo: Anticipated discharge in approximately 1-2 day(s).   Lacinda Axon, MD 07/25/2020, 6:04 AM Pager: [phone] Internal Medicine Teaching Service After 5pm on weekdays and 1pm on weekends: On Call pager [phone]

## 2020-07-25 NOTE — Care Management Important Message (Signed)
Important Message  Patient Details  Name: Rita [name] MRN: [id number] Date of Birth: [birth year]   Medicare Important Message Given:  Yes     [name] 07/25/2020, 1:57 PM

## 2020-07-25 NOTE — Hospital Course (Signed)
8/17  Reports feeling better today. Appears in good spirits. On 2 LPM [place] which is home O2 need. Will trial 1 LPM or no O2 today and see how she does. Discussed plan to discharge tomorrow and f/u in [facility] next week. Discontinued tele.

## 2020-07-26 ENCOUNTER — Other Ambulatory Visit: Payer: Self-pay | Admitting: Student

## 2020-07-26 DIAGNOSIS — R600 Localized edema: Secondary | ICD-10-CM

## 2020-07-26 LAB — BASIC METABOLIC PANEL
Anion gap: 9 (ref 5–15)
BUN: 28 mg/dL — ABNORMAL HIGH (ref 8–23)
CO2: 39 mmol/L — ABNORMAL HIGH (ref 22–32)
Calcium: 9.2 mg/dL (ref 8.9–10.3)
Chloride: 91 mmol/L — ABNORMAL LOW (ref 98–111)
Creatinine, Ser: 1.59 mg/dL — ABNORMAL HIGH (ref 0.44–1.00)
GFR calc Af Amer: 39 mL/min — ABNORMAL LOW (ref >60)
GFR calc non Af Amer: 33 mL/min — ABNORMAL LOW (ref >60)
Glucose, Bld: 163 mg/dL — ABNORMAL HIGH (ref 70–99)
Potassium: 4.3 mmol/L (ref 3.5–5.1)
Sodium: 139 mmol/L (ref 135–145)

## 2020-07-26 LAB — GLUCOSE, CAPILLARY
Glucose-Capillary: 106 mg/dL — ABNORMAL HIGH (ref 70–99)
Glucose-Capillary: 111 mg/dL — ABNORMAL HIGH (ref 70–99)
Glucose-Capillary: 186 mg/dL — ABNORMAL HIGH (ref 70–99)

## 2020-07-26 LAB — CBC
HCT: 28.3 % — ABNORMAL LOW (ref 36.0–46.0)
Hemoglobin: 9 g/dL — ABNORMAL LOW (ref 12.0–15.0)
MCH: 26.9 pg (ref 26.0–34.0)
MCHC: 31.8 g/dL (ref 30.0–36.0)
MCV: 84.7 fL (ref 80.0–100.0)
Platelets: 110 10*3/uL — ABNORMAL LOW (ref 150–400)
RBC: 3.34 MIL/uL — ABNORMAL LOW (ref 3.87–5.11)
RDW: 17.4 % — ABNORMAL HIGH (ref 11.5–15.5)
WBC: 4.5 10*3/uL (ref 4.0–10.5)
nRBC: 0 % (ref 0.0–0.2)

## 2020-07-26 MED ORDER — TORSEMIDE 20 MG PO TABS: 20.0000 mg | ORAL_TABLET | Freq: Every day | ORAL | 0 refills | Status: DC

## 2020-07-26 NOTE — Discharge Instructions (Signed)
Rita [name], it was a pleasure caring for you at the hospital.  You have made significant improvements in your breathing, swelling and oxygen levels.  We have set you up with home health so they will come back weekly to do an assessment and ensure you are continuing to make progress.  We have changed your fluid pill from Lasix to torsemide 20 mg once a day. Make sure to keep your compression socks on during the day and elevate your feet at night.  Take all your other medicines as prescribed.  We have also schedule an appointment for you at our internal medicine clinic on 08/02/2020 @ 9:15am with Dr. Sherry Ruffing.  They will check labs and make any necessary changes to your medications.  They will also have you see a nutritionist who will counsel you on the right type of food to eat in order to decrease your sodium intake.  Do not hesitate to let us know if you have any questions or concerns about your care.  Take care, Dr. Coy Saunas

## 2020-07-26 NOTE — Progress Notes (Signed)
Subjective:   No acute events overnight. Evaluated at bedside this morning. States she is ready to go. Reports her legs feel much improved.  Patient report that she feels much better compared to before she came to the hospital.  Team informed patient that we will make changes to her fluid pills and make sure she is on the appropriate medications so we can keep her out of the hospital.  Discussed we would arrange for a follow-up appointment in the [facility] clinic in the next week.   Objective:  Vital signs in last 24 hours: Vitals:   07/25/20 0818 07/25/20 1212 07/25/20 1930 07/26/20 0416  BP: 108/60 (!) 118/57 (!) 113/53 118/63  Pulse: 76 70 66 66  Resp: 18 18 17 19   Temp:  97.9 F (36.6 C) 98 F (36.7 C) 98.3 F (36.8 C)  TempSrc:  Oral Oral Oral  SpO2: 97% 95% 99% 93%  Weight:    111.8 kg  Height:        General: Pleasant, obese elderly woman laying comfortably in bed.  No acute distress.  Ready to go home. Eyes: Sclera non-icteric.  Head: [place]/AT Resp/Chest:  Currently on [facility] with sats 95% and above.  Lungs CTAB.  Mild rales in the lower lung fields.  No wheezes.  Normal effort. CV: Normal rate. Rhythm is irregularly irregular.  JVD resolved.  There is a 2/6 blowing systolic murmur heard best along the LUSB. 1+ pitting edema, to bilateral thighs with chronic venous stasis changes.  TED hose on both legs. Abdominal: Soft, nontender, nondistended. Indurated pannus resolved.  Normal bowel sounds. Skin: Hyperpigmented anterior shins, bilaterally. No rashes or lesions. Neuro: A&Ox3. Moves all extremities. Sensation intact.  No focal deficits. Psych: Normal mood and affect.  Excited to be leaving.  Assessment/Plan:   Principal Problem:   Acute on chronic combined systolic and diastolic heart failure (HCC) Active Problems:   Diabetes mellitus without complication (HCC)   AF (paroxysmal atrial fibrillation) (HCC)   CKD (chronic kidney disease), stage III   Acute respiratory failure  with hypoxia (HCC)   Thrombocytopenia (HCC)   Anemia  Acute on Chronic Systolic and Diastolic Heart Failure Patient presented with 2 weeks of worsening SOB associated with dry cough, LE and abdominal swelling, weight gain, orthopnea, and PND. CXR shows pulmonary congestion with bilateral pleural effusions. BNP 620.4. Troponin I 156 >171. S/p 40mg  Lasix IV in ED. ECHO shows EF of 35-40% w/ decreased LVSF, grade II diastolic dysfunction and mod RA & LA dilation. Weight on admission ~256lbs. Patient down 10 lbs since admission.  I/O neg -1 L overnight with a cumulative net of -6.8L since admission. Patient has made significant improvement in respiratory status since admission.  Patient admitted while on Lasix 40 with instructions to take an extra dose with increased swelling.  We will start patient on torsemide 20 discharged and have new PCP reassess and modify as necessary. --Discontinue lasix 40 mg IV BID --Continue KCl CR 40 mg PO daily at home --Home health nurse to do assessment --Start Torsemide 20 mg daily --Follow [facility] with [facility] next week (08/02/20) --Outpatient nutritionist counseling    Acute on Chronic Hypoxic Respiratory Failure SpO2 was in the 80's on admission on her chronic 2L [place] oxygen that she wears 24/7 at home.CXR shows worsening pulmonary edema and bilateral pleural effusions. Likely 2/2 acute CHF exacerbation.  SOB resolved.  Sats above 95% on home 2 L [place].  Discharged home with walker and home PT. --Continue home O2  at 2L --Home PT and walker for ambulation.   Chronic Atrial Fibrillation, Stable History of chronic afib on rate control. 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 Currently takes ASA 81mg  daily. She denies any CP, dizziness, or palpitations. CV exam shows irregularly irregular rhythm consistent with afib.  Stable throughout hospitalization. --Continue ASA 81mg  daily    --Continue Carvedilol 12.5 mg   Chronic Normocytic Anemia Hemoglobin 11.6 on admission. Patient has history of normocytic anemia and takes iron supplements (BID). Likely due to combination of IDA and anemia of chronic disease. Has history of small bowel w/ AVM's and reports dark stools 2/2 iron.  Iron studies came back normal.  FOBT showed no signs of internal bleeding.  Hemoglobin at discharge was 9.0 --Continue ferrous sulfate 325 mg once daily --Follow CBC at PCP  Thrombocytopenia Patient with low platelet levels at recent hospital admissions. Platelet 119 three weeks ago. Now in [place]. Likely 2/2 chronic anemia but will consider hemolytic anemia on the differential due to elevated bilirubin.  U/S abdomen did not show any evidence of splenomegaly.  FOBT was negative for internal bleeding.  Platelet started going up without any interventions.  Platelet at discharge was 110 --Normal retic count, no splenomegaly --Peripheral smear shows normocytic anemia and thrombocytopenia --Follow up labs at PCP   CKD stage IIIb Creatinine 1.60, BUN 39, GFR 37 on admission.  Patient denied trouble urinating or other urinary symptoms. S/p 40mg  Lasix IV in ED. Creatinine currently stable but slightly up (1.51-->1.59). Patient weight is down 17 lbs from highest weight of 264 during the hospitalization to 246.5 lbs on discharge day. Her admission weight was 255 lbs. --Discontinue lasix 40 mg IV BID --Start torsemide 20 mg daily --Continue KCl CR 40 mg PO daily at home  NIDDM type II Patient does not take her blood sugars at home. She endorsed thirst and recent abdominal pain/nausea. She takes Glipizide 5mg  daily and Metformin 500mg  in the AM only. CBG elevated while inpatient. Started on home glipizide. Last HgbA1c in May 2021 was 6.1.  Last blood sugar was 111 on discharge day.  Resume Metformin at home. --Resume home Metformin --Continue home glipizide 5 mg --Nutrition counseling   Hypertension Blood  pressure was stable throughout hospitalization. BP was well-controlled on home dose of carvedilol 12.5mg  BID. --Continue home carvedilol 12.5mg  BID   Osteoarthritis of the Knees States she takes Tylenol for arthritis PRN at home. No complaint of pain during admission. --Continue OTC Tylenol 650mg  q6hrs PRN  --Continue home PT --Ambulate w/ walker as tolerated   Gout, currently in remission Patient denies active gout flare currently. On allopurinol 100mg  BID at home. Uric acid elevated to 9.1. Repeat uric acid of 9.5. Treatment goal <6.5. --Continue home allopurinol 100 mg BID  Prior to Admission Living Arrangement: Home Anticipated Discharge Location: Home w/ home health Barriers to Discharge: None Dispo: Anticipated discharge today  Lacinda Axon, MD 07/26/2020, 6:06 AM Pager: [phone] Internal Medicine Teaching Service After 5pm on weekdays and 1pm on weekends: On Call pager [phone]

## 2020-07-26 NOTE — TOC Transition Note (Signed)
Transition of Care [facility]) - CM/SW Discharge Note   Patient Details  Name: Rita [name] MRN: [id number] Date of Birth: 08/27/1956  [facility]) CM/SW Contact:  Zenon Mayo, RN Phone Number: 07/26/2020, 10:21 AM   Clinical Narrative:    Patient is for dc today, she is active with [facility] for [place], [place] notified yesterday.  Patient states she needs a rolling walker, NCM offered to have one brought up to her but she states her MD in [place] is working on getting one for her so when [employer] tried to bring walker she refused it.  She has no other needs.   Final next level of care: [place] Barriers to Discharge: No Barriers Identified   Patient Goals and CMS Choice Patient states their goals for this hospitalization and ongoing recovery are:: get better CMS Medicare.gov Compare Post Acute Care list provided to:: Patient Choice offered to / list presented to : Patient  Discharge Placement                       Discharge Plan and Services   Discharge Planning Services: CM Consult Post Acute Care Choice: Home Health, Resumption of Svcs/PTA Provider          DME Arranged: Walker rolling DME Agency: NA Date DME Agency Contacted: 07/25/20 Time DME Agency Contacted: [phone] Representative spoke with at DME Agency: zach [place]: PT [place]: Well [place] Date [place]: 07/25/20 Time HH Agency Contacted: 1000 Representative spoke with at [place]: brittany  Social Determinants of Health (SDOH) Interventions Social Connections Interventions: Intervention Not Indicated Transportation Interventions: Intervention Not Indicated   Readmission Risk Interventions Readmission Risk Prevention Plan 07/25/2020  Transportation Screening Complete  PCP or Specialist Appt within 3-5 Days Complete  HRI or [place] Complete  Social Work Consult for [place] Planning/Counseling Complete  Palliative Care  Screening Not Applicable  Medication Review Press photographer) Complete  Some recent data might be hidden

## 2020-07-26 NOTE — Progress Notes (Signed)
Physical Therapy Treatment Patient Details Name: Rita [name] MRN: [id number] DOB: [birth year] Today's Date: 07/26/2020    History of Present Illness 64 yo female with LE edema and SOB was noted to have CHF.  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, pulmonary HTN, persistent Afib, and diastolic HF on supplmental O2 prn    PT Comments    Pt admitted with above diagnosis. Pt was able to ambulate with min guard assist with RW with good safety awareness. Will have husband to assist her at home and plan is to go home today. Pt on 2L at rest and needing 4L to keep sats >90% with ambulation. Pt aware.   Progressing well.  Pt currently with functional limitations due to balance and endurance deficits. Pt will benefit from skilled PT to increase their independence and safety with mobility to allow discharge to the venue listed below.     Follow Up Recommendations  Home health PT;Supervision for mobility/OOB     Equipment Recommendations  Rolling walker with 5" wheels    Recommendations for Other Services       Precautions / Restrictions Precautions Precautions: Fall Precaution Comments: monitor O2 sats Restrictions Weight Bearing Restrictions: No    Mobility  Bed Mobility               General bed mobility comments: in chair on arrival  Transfers Overall transfer level: Needs assistance Equipment used: Rolling walker (2 wheeled);1 person hand held assist Transfers: Sit to/from Stand Sit to Stand: Min guard         General transfer comment: Pt in chair on arrival. No assist to stand up.  cues for hand placement  Ambulation/Gait Ambulation/Gait assistance: Min guard Gait Distance (Feet): 150 Feet Assistive device: Rolling walker (2 wheeled) Gait Pattern/deviations: Wide base of support;Decreased stride length;Trunk flexed;Drifts right/left;Step-through pattern Gait velocity: reduced Gait velocity interpretation: <1.31 ft/sec, indicative of  household ambulator General Gait Details: Pt was able to progress ambulation with overall good safety with RW.  No LOB.    Stairs             Wheelchair Mobility    Modified Rankin (Stroke Patients Only)       Balance Overall balance assessment: Needs assistance Sitting-balance support: Feet supported Sitting balance-Leahy Scale: Fair     Standing balance support: Bilateral upper extremity supported;During functional activity Standing balance-Leahy Scale: Poor Standing balance comment: Pt relies on UE supporrt                            Cognition Arousal/Alertness: Awake/alert Behavior During Therapy: WFL for tasks assessed/performed Overall Cognitive Status: Within Functional Limits for tasks assessed                                        Exercises General Exercises - Lower Extremity Ankle Circles/Pumps: AROM;Both;10 reps;Seated Long Arc Quad: AROM;Both;10 reps;Seated Hip Flexion/Marching: AROM;Both;10 reps;Seated    General Comments        Pertinent Vitals/Pain Pain Assessment: No/denies pain    Home Living                      Prior Function            PT Goals (current goals can now be found in the care plan section) Acute Rehab PT  Goals Patient Stated Goal: to get stronger and get home safe Progress towards PT goals: Progressing toward goals    Frequency    Min 3X/week      PT Plan Current plan remains appropriate    Co-evaluation              AM-PAC PT "6 Clicks" Mobility   Outcome Measure  Help needed turning from your back to your side while in a flat bed without using bedrails?: A Little Help needed moving from lying on your back to sitting on the side of a flat bed without using bedrails?: A Little Help needed moving to and from a bed to a chair (including a wheelchair)?: A Little Help needed standing up from a chair using your arms (e.g., wheelchair or bedside chair)?: A Little Help  needed to walk in hospital room?: A Little Help needed climbing 3-5 steps with a railing? : A Lot 6 Click Score: 17    End of Session Equipment Utilized During Treatment: Oxygen;Gait belt Activity Tolerance: Patient tolerated treatment well Patient left: with call bell/phone within reach;in chair;with chair alarm set Nurse Communication: Mobility status PT Visit Diagnosis: Unsteadiness on feet (R26.81);Muscle weakness (generalized) (M62.81);Difficulty in walking, not elsewhere classified (R26.2)     Time: 5462-7035 PT Time Calculation (min) (ACUTE ONLY): 17 min  Charges:  $Gait Training: 8-22 mins                     Dvonte Gatliff W,PT [place] Pager:  [phone]  Office:  [place] 07/26/2020, 12:26 PM

## 2020-07-26 NOTE — Progress Notes (Signed)
D/C instructions given and reviewed. No questions voiced at this time. Encourage to call with any questions.concerns. IV removed. Tolerated well. Will call husband for ride.

## 2020-07-29 DIAGNOSIS — Z7982 Long term (current) use of aspirin: Secondary | ICD-10-CM | POA: Diagnosis not present

## 2020-07-29 DIAGNOSIS — J9611 Chronic respiratory failure with hypoxia: Secondary | ICD-10-CM | POA: Diagnosis not present

## 2020-07-29 DIAGNOSIS — Z9181 History of falling: Secondary | ICD-10-CM | POA: Diagnosis not present

## 2020-07-29 DIAGNOSIS — R32 Unspecified urinary incontinence: Secondary | ICD-10-CM | POA: Diagnosis not present

## 2020-07-29 DIAGNOSIS — I252 Old myocardial infarction: Secondary | ICD-10-CM | POA: Diagnosis not present

## 2020-07-29 DIAGNOSIS — I130 Hypertensive heart and chronic kidney disease with heart failure and stage 1 through stage 4 chronic kidney disease, or unspecified chronic kidney disease: Secondary | ICD-10-CM | POA: Diagnosis not present

## 2020-07-29 DIAGNOSIS — Z86711 Personal history of pulmonary embolism: Secondary | ICD-10-CM | POA: Diagnosis not present

## 2020-07-29 DIAGNOSIS — N183 Chronic kidney disease, stage 3 unspecified: Secondary | ICD-10-CM | POA: Diagnosis not present

## 2020-07-29 DIAGNOSIS — Z7984 Long term (current) use of oral hypoglycemic drugs: Secondary | ICD-10-CM | POA: Diagnosis not present

## 2020-07-29 DIAGNOSIS — Z8616 Personal history of COVID-19: Secondary | ICD-10-CM | POA: Diagnosis not present

## 2020-07-29 DIAGNOSIS — I272 Pulmonary hypertension, unspecified: Secondary | ICD-10-CM | POA: Diagnosis not present

## 2020-07-29 DIAGNOSIS — E049 Nontoxic goiter, unspecified: Secondary | ICD-10-CM | POA: Diagnosis not present

## 2020-07-29 DIAGNOSIS — Z9981 Dependence on supplemental oxygen: Secondary | ICD-10-CM | POA: Diagnosis not present

## 2020-07-29 DIAGNOSIS — E1122 Type 2 diabetes mellitus with diabetic chronic kidney disease: Secondary | ICD-10-CM | POA: Diagnosis not present

## 2020-07-29 DIAGNOSIS — I4819 Other persistent atrial fibrillation: Secondary | ICD-10-CM | POA: Diagnosis not present

## 2020-07-29 DIAGNOSIS — I5032 Chronic diastolic (congestive) heart failure: Secondary | ICD-10-CM | POA: Diagnosis not present

## 2020-07-29 DIAGNOSIS — Z6841 Body Mass Index (BMI) 40.0 and over, adult: Secondary | ICD-10-CM | POA: Diagnosis not present

## 2020-08-02 ENCOUNTER — Other Ambulatory Visit: Payer: Self-pay

## 2020-08-02 ENCOUNTER — Ambulatory Visit (INDEPENDENT_AMBULATORY_CARE_PROVIDER_SITE_OTHER): Payer: PPO | Admitting: Internal Medicine

## 2020-08-02 ENCOUNTER — Encounter: Payer: Self-pay | Admitting: Internal Medicine

## 2020-08-02 VITALS — BP 130/77 | HR 71 | Wt 250.1 lb

## 2020-08-02 DIAGNOSIS — R1032 Left lower quadrant pain: Secondary | ICD-10-CM

## 2020-08-02 DIAGNOSIS — K5900 Constipation, unspecified: Secondary | ICD-10-CM | POA: Diagnosis not present

## 2020-08-02 DIAGNOSIS — N1831 Chronic kidney disease, stage 3a: Secondary | ICD-10-CM | POA: Diagnosis not present

## 2020-08-02 DIAGNOSIS — I5043 Acute on chronic combined systolic (congestive) and diastolic (congestive) heart failure: Secondary | ICD-10-CM

## 2020-08-02 DIAGNOSIS — M109 Gout, unspecified: Secondary | ICD-10-CM

## 2020-08-02 DIAGNOSIS — D696 Thrombocytopenia, unspecified: Secondary | ICD-10-CM | POA: Diagnosis not present

## 2020-08-02 DIAGNOSIS — D649 Anemia, unspecified: Secondary | ICD-10-CM | POA: Diagnosis not present

## 2020-08-02 NOTE — Progress Notes (Addendum)
   CC: Heart failure hospital follow up  HPI:  Rita [name] is a 64 y.o. with history listed below including atrial fibrillation, CKD, heart failure, hypertension, anemia, thrombocytopenia and diabetes presenting for hospital follow-up for recent admission for heart failure exacerbation, also expresses concern about constipation.  Past Medical History:  Diagnosis Date  . Acute hypoxemic respiratory failure due to COVID-19 ([place]) 12/30/2019  . Anemia   . Arthritis   . Atrial fibrillation ([place])   . Chronic kidney disease   . Chronic respiratory failure with hypoxia, on home O2 therapy ([place])   . CKD (chronic kidney disease), stage III   . COVID-19   . Diabetes mellitus without complication ([place])   . Diastolic CHF ([place])   . GI bleed   . GI bleed   . Gout   . Hypertension   . Thrombocytopenia ([place])    Review of Systems:   Constitutional: Negative for chills and fever.  Respiratory: Negative for shortness of breath.   Cardiovascular: Negative for chest pain.positive for leg swelling.  Gastrointestinal: Negative for abdominal pain, nausea and vomiting.  Positive for constipation Neurological: Negative for dizziness and headaches.    Physical Exam:  Vitals:   08/02/20 0920  BP: 130/77  Pulse: 71  SpO2: 100%  Weight: 250 lb 1.6 oz (113.4 kg)   Physical Exam Constitutional:      Appearance: Normal appearance.  HENT:     Mouth/Throat:     Mouth: Mucous membranes are moist.     Pharynx: Oropharynx is clear.  Cardiovascular:     Rate and Rhythm: Normal rate and regular rhythm.     Pulses: Normal pulses.     Heart sounds: Normal heart sounds.  Pulmonary:     Effort: Pulmonary effort is normal. No respiratory distress.     Breath sounds: Normal breath sounds. No wheezing, rhonchi or rales.     Comments: On 2L [place] Abdominal:     General: Abdomen is flat. Bowel sounds are normal.     Palpations: Abdomen is soft.     Tenderness: There is abdominal tenderness  (Mild TTP over left lower quadrant ). There is no guarding or rebound.  Skin:    General: Skin is warm and dry.     Capillary Refill: Capillary refill takes less than 2 seconds.  Neurological:     General: No focal deficit present.     Mental Status: She is alert and oriented to person, place, and time.  Psychiatric:        Mood and Affect: Mood normal.        Behavior: Behavior normal.    Assessment & Plan:   See Encounters Tab for problem based charting.  Patient discussed with Dr. Jimmye Norman   Attending attestation DOS 08/02/20: Internal Medicine Clinic Attending  Case discussed with Dr. Sherry Ruffing  At the time of the visit.  We reviewed the resident's history and exam and pertinent patient test results.  I agree with the assessment, diagnosis, and plan of care documented in the resident's note.

## 2020-08-02 NOTE — Assessment & Plan Note (Signed)
Patient has history of normocytic anemia and thrombocytopenia.  Normocytic anemia could be due to combination of IDA and anemia of chronic disease, she is currently on ferrous sulfate 325 mg daily.  The etiology of the thrombocytopenia is unclear, work-up done in the hospital was unremarkable.  Will obtain a CBC today.  Will need to continue following up with her PCP to evaluate this.

## 2020-08-02 NOTE — Assessment & Plan Note (Signed)
Patient noted to have a history of gout and is currently on allopurinol.  No recent exacerbations.  Will obtain uric acid level today.  -Continue allopurinol daily -Uric acid

## 2020-08-02 NOTE — Patient Instructions (Addendum)
Ms. Fumie Fiallo,  It was a pleasure to see you today. Thank you for coming in.   Today we discussed your heart failure. I am glad that you are doing well from that stand point. Please continue taking the Torsemide 20 mg daily. Please continue monitoring your weights daily and monitor your sodium and fluid intake. We discussed you coming to clinic to be followed so that your records can all be in one system but you were not interested in this. Please follow up with your primary care provider to follow this.   We also discussed your constipation. Please continue taking the Miralax daily. Please start taking Senna daily at night in addition to the Miralax, this can help with constipation.  We are checking some labs and will contact you if they are abnormal.   Please return to clinic if needed. Please follow up with Dr. Bea Graff.   Thank you again for coming in.   Asencion Noble.D.

## 2020-08-02 NOTE — Assessment & Plan Note (Signed)
Patient was admitted from 8/11-8/18 for acute on chronic combined systolic and diastolic heart failure exacerbation.  She is treated with IV diuresis with a net -6.8 L in decreasing 10 pounds on day of discharge.  Her home Lasix was switched to home torsemide 20 mg daily.  Weight on day of discharge was 117 kg.  Today she reports that she is feeling well, she is currently on her 2 L nasal cannula which is chronic, she does state that she has switched over to torsemide daily.  Feels that she is urinating well.  Her breathing is stable.  She denies any chest pain, her swelling has improved, she denies any new complaints.  On exam her weight is 113 kg, breathing well, no acute distress, lungs are CTA BL, no wheezing, rhonchi, rales, does have 1+ bilateral pitting edema up to the midshin. Overall she seems to be doing well from her heart failure exacerbation.  While she was in the hospital she was recommended to stick to 1 hospital system to limit her fragmentation of her care.  However patient reported that she would like to still continue following with Dr. Bea Graff, states that they live in [place] and that it is easier for her to follow-up with them.  Discussed that [facility] is able to see his notes so would recommend considering going there if she has another exacerbation.  Overall her heart failure is stable at this time.  Will obtain labs today.  Advised to follow-up with her primary care provider.  -Continue torsemide 20 mg daily -CBC and BMP -Continue 2 L nasal cannula -Follow-up with PCP

## 2020-08-02 NOTE — Assessment & Plan Note (Signed)
Patient reports that she has been having some constipation since she left the hospital, states that she has been eating prunes and taking over-the-counter MiraLAX occasionally.  Has been having about 2 bowel movements per day that are a normal amount.  She denies any nausea, vomiting, or diarrhea.  Has some left lower quadrant abdominal pain which she attributes to constipation.  On exam she has no acute abdomen, mild tenderness to palpation of the left upper quadrant.  She does have multiple risk factors lead that could lead to constipation including being on iron tablets, and history of heart failure on diuretics.  -Continue MiraLAX daily -Advised to start taking senna nightly -Advised to continue eating a healthy diet, with fruits and vegetables

## 2020-08-03 LAB — CBC
Hematocrit: 30.8 % — ABNORMAL LOW (ref 34.0–46.6)
Hemoglobin: 9.3 g/dL — ABNORMAL LOW (ref 11.1–15.9)
MCH: 26.3 pg — ABNORMAL LOW (ref 26.6–33.0)
MCHC: 30.2 g/dL — ABNORMAL LOW (ref 31.5–35.7)
MCV: 87 fL (ref 79–97)
Platelets: 122 10*3/uL — ABNORMAL LOW (ref 150–450)
RBC: 3.54 x10E6/uL — ABNORMAL LOW (ref 3.77–5.28)
RDW: 16.2 % — ABNORMAL HIGH (ref 11.7–15.4)
WBC: 4.2 10*3/uL (ref 3.4–10.8)

## 2020-08-03 LAB — BMP8+ANION GAP
Anion Gap: 16 mmol/L (ref 10.0–18.0)
BUN/Creatinine Ratio: 18 (ref 12–28)
BUN: 32 mg/dL — ABNORMAL HIGH (ref 8–27)
CO2: 31 mmol/L — ABNORMAL HIGH (ref 20–29)
Calcium: 9.3 mg/dL (ref 8.7–10.3)
Chloride: 92 mmol/L — ABNORMAL LOW (ref 96–106)
Creatinine, Ser: 1.76 mg/dL — ABNORMAL HIGH (ref 0.57–1.00)
GFR calc Af Amer: 34 mL/min/{1.73_m2} — ABNORMAL LOW (ref >59)
GFR calc non Af Amer: 30 mL/min/{1.73_m2} — ABNORMAL LOW (ref >59)
Glucose: 123 mg/dL — ABNORMAL HIGH (ref 65–99)
Potassium: 4 mmol/L (ref 3.5–5.2)
Sodium: 139 mmol/L (ref 134–144)

## 2020-08-03 LAB — URIC ACID: Uric Acid: 8.7 mg/dL — ABNORMAL HIGH (ref 3.0–7.2)

## 2020-08-08 DIAGNOSIS — E049 Nontoxic goiter, unspecified: Secondary | ICD-10-CM | POA: Diagnosis not present

## 2020-08-08 DIAGNOSIS — I272 Pulmonary hypertension, unspecified: Secondary | ICD-10-CM | POA: Diagnosis not present

## 2020-08-08 DIAGNOSIS — I252 Old myocardial infarction: Secondary | ICD-10-CM | POA: Diagnosis not present

## 2020-08-08 DIAGNOSIS — J9611 Chronic respiratory failure with hypoxia: Secondary | ICD-10-CM | POA: Diagnosis not present

## 2020-08-08 DIAGNOSIS — Z86711 Personal history of pulmonary embolism: Secondary | ICD-10-CM | POA: Diagnosis not present

## 2020-08-08 DIAGNOSIS — Z7982 Long term (current) use of aspirin: Secondary | ICD-10-CM | POA: Diagnosis not present

## 2020-08-08 DIAGNOSIS — R32 Unspecified urinary incontinence: Secondary | ICD-10-CM | POA: Diagnosis not present

## 2020-08-08 DIAGNOSIS — N183 Chronic kidney disease, stage 3 unspecified: Secondary | ICD-10-CM | POA: Diagnosis not present

## 2020-08-08 DIAGNOSIS — Z7984 Long term (current) use of oral hypoglycemic drugs: Secondary | ICD-10-CM | POA: Diagnosis not present

## 2020-08-08 DIAGNOSIS — Z9181 History of falling: Secondary | ICD-10-CM | POA: Diagnosis not present

## 2020-08-08 DIAGNOSIS — Z8616 Personal history of COVID-19: Secondary | ICD-10-CM | POA: Diagnosis not present

## 2020-08-08 DIAGNOSIS — E1122 Type 2 diabetes mellitus with diabetic chronic kidney disease: Secondary | ICD-10-CM | POA: Diagnosis not present

## 2020-08-08 DIAGNOSIS — I4819 Other persistent atrial fibrillation: Secondary | ICD-10-CM | POA: Diagnosis not present

## 2020-08-08 DIAGNOSIS — Z6841 Body Mass Index (BMI) 40.0 and over, adult: Secondary | ICD-10-CM | POA: Diagnosis not present

## 2020-08-08 DIAGNOSIS — I130 Hypertensive heart and chronic kidney disease with heart failure and stage 1 through stage 4 chronic kidney disease, or unspecified chronic kidney disease: Secondary | ICD-10-CM | POA: Diagnosis not present

## 2020-08-08 DIAGNOSIS — I5032 Chronic diastolic (congestive) heart failure: Secondary | ICD-10-CM | POA: Diagnosis not present

## 2020-08-08 DIAGNOSIS — Z9981 Dependence on supplemental oxygen: Secondary | ICD-10-CM | POA: Diagnosis not present

## 2020-08-10 DIAGNOSIS — Z9181 History of falling: Secondary | ICD-10-CM | POA: Diagnosis not present

## 2020-08-10 DIAGNOSIS — Z8616 Personal history of COVID-19: Secondary | ICD-10-CM | POA: Diagnosis not present

## 2020-08-10 DIAGNOSIS — N183 Chronic kidney disease, stage 3 unspecified: Secondary | ICD-10-CM | POA: Diagnosis not present

## 2020-08-10 DIAGNOSIS — E049 Nontoxic goiter, unspecified: Secondary | ICD-10-CM | POA: Diagnosis not present

## 2020-08-10 DIAGNOSIS — E1122 Type 2 diabetes mellitus with diabetic chronic kidney disease: Secondary | ICD-10-CM | POA: Diagnosis not present

## 2020-08-10 DIAGNOSIS — I252 Old myocardial infarction: Secondary | ICD-10-CM | POA: Diagnosis not present

## 2020-08-10 DIAGNOSIS — Z6841 Body Mass Index (BMI) 40.0 and over, adult: Secondary | ICD-10-CM | POA: Diagnosis not present

## 2020-08-10 DIAGNOSIS — Z9981 Dependence on supplemental oxygen: Secondary | ICD-10-CM | POA: Diagnosis not present

## 2020-08-10 DIAGNOSIS — I4819 Other persistent atrial fibrillation: Secondary | ICD-10-CM | POA: Diagnosis not present

## 2020-08-10 DIAGNOSIS — Z86711 Personal history of pulmonary embolism: Secondary | ICD-10-CM | POA: Diagnosis not present

## 2020-08-10 DIAGNOSIS — I272 Pulmonary hypertension, unspecified: Secondary | ICD-10-CM | POA: Diagnosis not present

## 2020-08-10 DIAGNOSIS — Z7984 Long term (current) use of oral hypoglycemic drugs: Secondary | ICD-10-CM | POA: Diagnosis not present

## 2020-08-10 DIAGNOSIS — I130 Hypertensive heart and chronic kidney disease with heart failure and stage 1 through stage 4 chronic kidney disease, or unspecified chronic kidney disease: Secondary | ICD-10-CM | POA: Diagnosis not present

## 2020-08-10 DIAGNOSIS — I5032 Chronic diastolic (congestive) heart failure: Secondary | ICD-10-CM | POA: Diagnosis not present

## 2020-08-10 DIAGNOSIS — R32 Unspecified urinary incontinence: Secondary | ICD-10-CM | POA: Diagnosis not present

## 2020-08-10 DIAGNOSIS — J9611 Chronic respiratory failure with hypoxia: Secondary | ICD-10-CM | POA: Diagnosis not present

## 2020-08-10 DIAGNOSIS — Z7982 Long term (current) use of aspirin: Secondary | ICD-10-CM | POA: Diagnosis not present

## 2020-08-12 DIAGNOSIS — I509 Heart failure, unspecified: Secondary | ICD-10-CM | POA: Diagnosis not present

## 2020-08-14 DIAGNOSIS — Z6841 Body Mass Index (BMI) 40.0 and over, adult: Secondary | ICD-10-CM | POA: Diagnosis not present

## 2020-08-14 DIAGNOSIS — Z9181 History of falling: Secondary | ICD-10-CM | POA: Diagnosis not present

## 2020-08-14 DIAGNOSIS — E049 Nontoxic goiter, unspecified: Secondary | ICD-10-CM | POA: Diagnosis not present

## 2020-08-14 DIAGNOSIS — Z7984 Long term (current) use of oral hypoglycemic drugs: Secondary | ICD-10-CM | POA: Diagnosis not present

## 2020-08-14 DIAGNOSIS — R32 Unspecified urinary incontinence: Secondary | ICD-10-CM | POA: Diagnosis not present

## 2020-08-14 DIAGNOSIS — Z7982 Long term (current) use of aspirin: Secondary | ICD-10-CM | POA: Diagnosis not present

## 2020-08-14 DIAGNOSIS — Z9981 Dependence on supplemental oxygen: Secondary | ICD-10-CM | POA: Diagnosis not present

## 2020-08-14 DIAGNOSIS — Z8616 Personal history of COVID-19: Secondary | ICD-10-CM | POA: Diagnosis not present

## 2020-08-14 DIAGNOSIS — Z86711 Personal history of pulmonary embolism: Secondary | ICD-10-CM | POA: Diagnosis not present

## 2020-08-14 DIAGNOSIS — E1122 Type 2 diabetes mellitus with diabetic chronic kidney disease: Secondary | ICD-10-CM | POA: Diagnosis not present

## 2020-08-14 DIAGNOSIS — J9611 Chronic respiratory failure with hypoxia: Secondary | ICD-10-CM | POA: Diagnosis not present

## 2020-08-14 DIAGNOSIS — I130 Hypertensive heart and chronic kidney disease with heart failure and stage 1 through stage 4 chronic kidney disease, or unspecified chronic kidney disease: Secondary | ICD-10-CM | POA: Diagnosis not present

## 2020-08-14 DIAGNOSIS — N183 Chronic kidney disease, stage 3 unspecified: Secondary | ICD-10-CM | POA: Diagnosis not present

## 2020-08-14 DIAGNOSIS — I272 Pulmonary hypertension, unspecified: Secondary | ICD-10-CM | POA: Diagnosis not present

## 2020-08-14 DIAGNOSIS — I5032 Chronic diastolic (congestive) heart failure: Secondary | ICD-10-CM | POA: Diagnosis not present

## 2020-08-14 DIAGNOSIS — I4819 Other persistent atrial fibrillation: Secondary | ICD-10-CM | POA: Diagnosis not present

## 2020-08-14 DIAGNOSIS — I252 Old myocardial infarction: Secondary | ICD-10-CM | POA: Diagnosis not present

## 2020-08-17 DIAGNOSIS — I5032 Chronic diastolic (congestive) heart failure: Secondary | ICD-10-CM | POA: Diagnosis not present

## 2020-08-17 DIAGNOSIS — Z7984 Long term (current) use of oral hypoglycemic drugs: Secondary | ICD-10-CM | POA: Diagnosis not present

## 2020-08-17 DIAGNOSIS — Z86711 Personal history of pulmonary embolism: Secondary | ICD-10-CM | POA: Diagnosis not present

## 2020-08-17 DIAGNOSIS — E049 Nontoxic goiter, unspecified: Secondary | ICD-10-CM | POA: Diagnosis not present

## 2020-08-17 DIAGNOSIS — I272 Pulmonary hypertension, unspecified: Secondary | ICD-10-CM | POA: Diagnosis not present

## 2020-08-17 DIAGNOSIS — I130 Hypertensive heart and chronic kidney disease with heart failure and stage 1 through stage 4 chronic kidney disease, or unspecified chronic kidney disease: Secondary | ICD-10-CM | POA: Diagnosis not present

## 2020-08-17 DIAGNOSIS — J9611 Chronic respiratory failure with hypoxia: Secondary | ICD-10-CM | POA: Diagnosis not present

## 2020-08-17 DIAGNOSIS — Z9981 Dependence on supplemental oxygen: Secondary | ICD-10-CM | POA: Diagnosis not present

## 2020-08-17 DIAGNOSIS — Z6841 Body Mass Index (BMI) 40.0 and over, adult: Secondary | ICD-10-CM | POA: Diagnosis not present

## 2020-08-17 DIAGNOSIS — I4819 Other persistent atrial fibrillation: Secondary | ICD-10-CM | POA: Diagnosis not present

## 2020-08-17 DIAGNOSIS — I252 Old myocardial infarction: Secondary | ICD-10-CM | POA: Diagnosis not present

## 2020-08-17 DIAGNOSIS — R32 Unspecified urinary incontinence: Secondary | ICD-10-CM | POA: Diagnosis not present

## 2020-08-17 DIAGNOSIS — Z8616 Personal history of COVID-19: Secondary | ICD-10-CM | POA: Diagnosis not present

## 2020-08-17 DIAGNOSIS — E1122 Type 2 diabetes mellitus with diabetic chronic kidney disease: Secondary | ICD-10-CM | POA: Diagnosis not present

## 2020-08-17 DIAGNOSIS — Z9181 History of falling: Secondary | ICD-10-CM | POA: Diagnosis not present

## 2020-08-17 DIAGNOSIS — Z7982 Long term (current) use of aspirin: Secondary | ICD-10-CM | POA: Diagnosis not present

## 2020-08-17 DIAGNOSIS — N183 Chronic kidney disease, stage 3 unspecified: Secondary | ICD-10-CM | POA: Diagnosis not present

## 2020-08-23 DIAGNOSIS — R531 Weakness: Secondary | ICD-10-CM | POA: Diagnosis not present

## 2020-08-23 DIAGNOSIS — Z9181 History of falling: Secondary | ICD-10-CM | POA: Diagnosis not present

## 2020-08-24 DIAGNOSIS — N183 Chronic kidney disease, stage 3 unspecified: Secondary | ICD-10-CM | POA: Diagnosis not present

## 2020-08-24 DIAGNOSIS — R5381 Other malaise: Secondary | ICD-10-CM | POA: Diagnosis not present

## 2020-08-24 DIAGNOSIS — I272 Pulmonary hypertension, unspecified: Secondary | ICD-10-CM | POA: Diagnosis not present

## 2020-08-24 DIAGNOSIS — E1122 Type 2 diabetes mellitus with diabetic chronic kidney disease: Secondary | ICD-10-CM | POA: Diagnosis not present

## 2020-08-24 DIAGNOSIS — N1832 Chronic kidney disease, stage 3b: Secondary | ICD-10-CM | POA: Diagnosis not present

## 2020-08-24 DIAGNOSIS — I130 Hypertensive heart and chronic kidney disease with heart failure and stage 1 through stage 4 chronic kidney disease, or unspecified chronic kidney disease: Secondary | ICD-10-CM | POA: Diagnosis not present

## 2020-08-24 DIAGNOSIS — Z23 Encounter for immunization: Secondary | ICD-10-CM | POA: Diagnosis not present

## 2020-08-24 DIAGNOSIS — I10 Essential (primary) hypertension: Secondary | ICD-10-CM | POA: Diagnosis not present

## 2020-08-24 DIAGNOSIS — R5383 Other fatigue: Secondary | ICD-10-CM | POA: Diagnosis not present

## 2020-08-24 DIAGNOSIS — I5032 Chronic diastolic (congestive) heart failure: Secondary | ICD-10-CM | POA: Diagnosis not present

## 2020-08-24 DIAGNOSIS — J9611 Chronic respiratory failure with hypoxia: Secondary | ICD-10-CM | POA: Diagnosis not present

## 2020-08-24 DIAGNOSIS — I4819 Other persistent atrial fibrillation: Secondary | ICD-10-CM | POA: Diagnosis not present

## 2020-08-24 DIAGNOSIS — M8949 Other hypertrophic osteoarthropathy, multiple sites: Secondary | ICD-10-CM | POA: Diagnosis not present

## 2020-08-27 ENCOUNTER — Emergency Department (HOSPITAL_COMMUNITY): Payer: PPO

## 2020-08-27 ENCOUNTER — Encounter (HOSPITAL_COMMUNITY): Payer: Self-pay | Admitting: *Deleted

## 2020-08-27 ENCOUNTER — Inpatient Hospital Stay (HOSPITAL_COMMUNITY)
Admission: EM | Admit: 2020-08-27 | Discharge: 2020-09-08 | DRG: 286 | Disposition: A | Discharge status: Home Health | Payer: PPO | Attending: Internal Medicine | Admitting: Internal Medicine

## 2020-08-27 ENCOUNTER — Other Ambulatory Visit: Payer: Self-pay

## 2020-08-27 DIAGNOSIS — I472 Ventricular tachycardia: Secondary | ICD-10-CM | POA: Diagnosis present

## 2020-08-27 DIAGNOSIS — J811 Chronic pulmonary edema: Secondary | ICD-10-CM | POA: Diagnosis not present

## 2020-08-27 DIAGNOSIS — M170 Bilateral primary osteoarthritis of knee: Secondary | ICD-10-CM | POA: Diagnosis present

## 2020-08-27 DIAGNOSIS — I361 Nonrheumatic tricuspid (valve) insufficiency: Secondary | ICD-10-CM | POA: Diagnosis not present

## 2020-08-27 DIAGNOSIS — I5043 Acute on chronic combined systolic (congestive) and diastolic (congestive) heart failure: Secondary | ICD-10-CM | POA: Diagnosis present

## 2020-08-27 DIAGNOSIS — Z86711 Personal history of pulmonary embolism: Secondary | ICD-10-CM | POA: Diagnosis not present

## 2020-08-27 DIAGNOSIS — Z8616 Personal history of COVID-19: Secondary | ICD-10-CM

## 2020-08-27 DIAGNOSIS — N184 Chronic kidney disease, stage 4 (severe): Secondary | ICD-10-CM | POA: Diagnosis present

## 2020-08-27 DIAGNOSIS — K5900 Constipation, unspecified: Secondary | ICD-10-CM | POA: Diagnosis present

## 2020-08-27 DIAGNOSIS — Z923 Personal history of irradiation: Secondary | ICD-10-CM

## 2020-08-27 DIAGNOSIS — E1122 Type 2 diabetes mellitus with diabetic chronic kidney disease: Secondary | ICD-10-CM | POA: Diagnosis present

## 2020-08-27 DIAGNOSIS — Z8651 Personal history of combat and operational stress reaction: Secondary | ICD-10-CM

## 2020-08-27 DIAGNOSIS — I4891 Unspecified atrial fibrillation: Secondary | ICD-10-CM | POA: Diagnosis not present

## 2020-08-27 DIAGNOSIS — Z6841 Body Mass Index (BMI) 40.0 and over, adult: Secondary | ICD-10-CM | POA: Diagnosis not present

## 2020-08-27 DIAGNOSIS — I509 Heart failure, unspecified: Secondary | ICD-10-CM | POA: Diagnosis not present

## 2020-08-27 DIAGNOSIS — R600 Localized edema: Secondary | ICD-10-CM | POA: Diagnosis not present

## 2020-08-27 DIAGNOSIS — N183 Chronic kidney disease, stage 3 unspecified: Secondary | ICD-10-CM | POA: Diagnosis present

## 2020-08-27 DIAGNOSIS — Z79899 Other long term (current) drug therapy: Secondary | ICD-10-CM

## 2020-08-27 DIAGNOSIS — I152 Hypertension secondary to endocrine disorders: Secondary | ICD-10-CM | POA: Diagnosis present

## 2020-08-27 DIAGNOSIS — J918 Pleural effusion in other conditions classified elsewhere: Secondary | ICD-10-CM | POA: Diagnosis present

## 2020-08-27 DIAGNOSIS — Z87891 Personal history of nicotine dependence: Secondary | ICD-10-CM

## 2020-08-27 DIAGNOSIS — Z86718 Personal history of other venous thrombosis and embolism: Secondary | ICD-10-CM | POA: Diagnosis not present

## 2020-08-27 DIAGNOSIS — N179 Acute kidney failure, unspecified: Secondary | ICD-10-CM | POA: Diagnosis present

## 2020-08-27 DIAGNOSIS — D649 Anemia, unspecified: Secondary | ICD-10-CM | POA: Diagnosis present

## 2020-08-27 DIAGNOSIS — E1159 Type 2 diabetes mellitus with other circulatory complications: Secondary | ICD-10-CM | POA: Diagnosis present

## 2020-08-27 DIAGNOSIS — Z95828 Presence of other vascular implants and grafts: Secondary | ICD-10-CM

## 2020-08-27 DIAGNOSIS — I4819 Other persistent atrial fibrillation: Secondary | ICD-10-CM | POA: Diagnosis present

## 2020-08-27 DIAGNOSIS — Z7982 Long term (current) use of aspirin: Secondary | ICD-10-CM

## 2020-08-27 DIAGNOSIS — I130 Hypertensive heart and chronic kidney disease with heart failure and stage 1 through stage 4 chronic kidney disease, or unspecified chronic kidney disease: Principal | ICD-10-CM | POA: Diagnosis present

## 2020-08-27 DIAGNOSIS — I272 Pulmonary hypertension, unspecified: Secondary | ICD-10-CM | POA: Diagnosis present

## 2020-08-27 DIAGNOSIS — I517 Cardiomegaly: Secondary | ICD-10-CM | POA: Diagnosis not present

## 2020-08-27 DIAGNOSIS — I482 Chronic atrial fibrillation, unspecified: Secondary | ICD-10-CM | POA: Diagnosis not present

## 2020-08-27 DIAGNOSIS — I491 Atrial premature depolarization: Secondary | ICD-10-CM | POA: Diagnosis not present

## 2020-08-27 DIAGNOSIS — J90 Pleural effusion, not elsewhere classified: Secondary | ICD-10-CM

## 2020-08-27 DIAGNOSIS — D631 Anemia in chronic kidney disease: Secondary | ICD-10-CM | POA: Diagnosis present

## 2020-08-27 DIAGNOSIS — E119 Type 2 diabetes mellitus without complications: Secondary | ICD-10-CM

## 2020-08-27 DIAGNOSIS — E1165 Type 2 diabetes mellitus with hyperglycemia: Secondary | ICD-10-CM | POA: Diagnosis not present

## 2020-08-27 DIAGNOSIS — N189 Chronic kidney disease, unspecified: Secondary | ICD-10-CM | POA: Diagnosis present

## 2020-08-27 DIAGNOSIS — I5023 Acute on chronic systolic (congestive) heart failure: Secondary | ICD-10-CM | POA: Diagnosis not present

## 2020-08-27 DIAGNOSIS — I5082 Biventricular heart failure: Secondary | ICD-10-CM | POA: Diagnosis present

## 2020-08-27 DIAGNOSIS — Z9981 Dependence on supplemental oxygen: Secondary | ICD-10-CM

## 2020-08-27 DIAGNOSIS — Z7984 Long term (current) use of oral hypoglycemic drugs: Secondary | ICD-10-CM | POA: Diagnosis not present

## 2020-08-27 DIAGNOSIS — I5021 Acute systolic (congestive) heart failure: Secondary | ICD-10-CM | POA: Diagnosis present

## 2020-08-27 DIAGNOSIS — D61818 Other pancytopenia: Secondary | ICD-10-CM | POA: Diagnosis present

## 2020-08-27 DIAGNOSIS — J9811 Atelectasis: Secondary | ICD-10-CM | POA: Diagnosis not present

## 2020-08-27 DIAGNOSIS — R0602 Shortness of breath: Secondary | ICD-10-CM | POA: Diagnosis not present

## 2020-08-27 DIAGNOSIS — I110 Hypertensive heart disease with heart failure: Secondary | ICD-10-CM | POA: Diagnosis not present

## 2020-08-27 DIAGNOSIS — R091 Pleurisy: Secondary | ICD-10-CM | POA: Diagnosis not present

## 2020-08-27 DIAGNOSIS — Z853 Personal history of malignant neoplasm of breast: Secondary | ICD-10-CM

## 2020-08-27 DIAGNOSIS — J9621 Acute and chronic respiratory failure with hypoxia: Secondary | ICD-10-CM | POA: Diagnosis present

## 2020-08-27 DIAGNOSIS — E1169 Type 2 diabetes mellitus with other specified complication: Secondary | ICD-10-CM | POA: Diagnosis present

## 2020-08-27 DIAGNOSIS — R52 Pain, unspecified: Secondary | ICD-10-CM | POA: Diagnosis not present

## 2020-08-27 DIAGNOSIS — J9611 Chronic respiratory failure with hypoxia: Secondary | ICD-10-CM | POA: Diagnosis not present

## 2020-08-27 DIAGNOSIS — D696 Thrombocytopenia, unspecified: Secondary | ICD-10-CM | POA: Diagnosis not present

## 2020-08-27 DIAGNOSIS — I50810 Right heart failure, unspecified: Secondary | ICD-10-CM | POA: Diagnosis not present

## 2020-08-27 DIAGNOSIS — M109 Gout, unspecified: Secondary | ICD-10-CM | POA: Diagnosis present

## 2020-08-27 DIAGNOSIS — R069 Unspecified abnormalities of breathing: Secondary | ICD-10-CM | POA: Diagnosis not present

## 2020-08-27 DIAGNOSIS — N1832 Chronic kidney disease, stage 3b: Secondary | ICD-10-CM | POA: Diagnosis not present

## 2020-08-27 DIAGNOSIS — Z7901 Long term (current) use of anticoagulants: Secondary | ICD-10-CM | POA: Diagnosis not present

## 2020-08-27 LAB — BASIC METABOLIC PANEL
Anion gap: 9 (ref 5–15)
BUN: 42 mg/dL — ABNORMAL HIGH (ref 8–23)
CO2: 31 mmol/L (ref 22–32)
Calcium: 9.3 mg/dL (ref 8.9–10.3)
Chloride: 98 mmol/L (ref 98–111)
Creatinine, Ser: 2.06 mg/dL — ABNORMAL HIGH (ref 0.44–1.00)
GFR calc Af Amer: 28 mL/min — ABNORMAL LOW (ref >60)
GFR calc non Af Amer: 24 mL/min — ABNORMAL LOW (ref >60)
Glucose, Bld: 223 mg/dL — ABNORMAL HIGH (ref 70–99)
Potassium: 4.7 mmol/L (ref 3.5–5.1)
Sodium: 138 mmol/L (ref 135–145)

## 2020-08-27 LAB — CBC
HCT: 29.4 % — ABNORMAL LOW (ref 36.0–46.0)
Hemoglobin: 8.9 g/dL — ABNORMAL LOW (ref 12.0–15.0)
MCH: 26.1 pg (ref 26.0–34.0)
MCHC: 30.3 g/dL (ref 30.0–36.0)
MCV: 86.2 fL (ref 80.0–100.0)
Platelets: 87 10*3/uL — ABNORMAL LOW (ref 150–400)
RBC: 3.41 MIL/uL — ABNORMAL LOW (ref 3.87–5.11)
RDW: 18.5 % — ABNORMAL HIGH (ref 11.5–15.5)
WBC: 4.6 10*3/uL (ref 4.0–10.5)
nRBC: 0 % (ref 0.0–0.2)

## 2020-08-27 LAB — TROPONIN I (HIGH SENSITIVITY)
Troponin I (High Sensitivity): 139 ng/L (ref <18)
Troponin I (High Sensitivity): 148 ng/L (ref <18)

## 2020-08-27 NOTE — ED Triage Notes (Signed)
Pt has been covid vaccined both shots and had fku shot last week a and o x 4

## 2020-08-27 NOTE — ED Notes (Signed)
Dr. Regenia Skeeter notified of Trop 139

## 2020-08-27 NOTE — ED Triage Notes (Signed)
The  Pt arrived by [facility] from home  The pt is c/o   Some sob and swollen extremities for 2-3 days    She was seen by her doctor on Thursday and was ok

## 2020-08-28 ENCOUNTER — Inpatient Hospital Stay (HOSPITAL_COMMUNITY): Payer: PPO

## 2020-08-28 ENCOUNTER — Encounter (HOSPITAL_COMMUNITY): Payer: Self-pay | Admitting: Student

## 2020-08-28 DIAGNOSIS — M109 Gout, unspecified: Secondary | ICD-10-CM | POA: Diagnosis present

## 2020-08-28 DIAGNOSIS — I5043 Acute on chronic combined systolic (congestive) and diastolic (congestive) heart failure: Secondary | ICD-10-CM | POA: Diagnosis present

## 2020-08-28 DIAGNOSIS — Z86718 Personal history of other venous thrombosis and embolism: Secondary | ICD-10-CM | POA: Diagnosis not present

## 2020-08-28 DIAGNOSIS — E1169 Type 2 diabetes mellitus with other specified complication: Secondary | ICD-10-CM | POA: Diagnosis present

## 2020-08-28 DIAGNOSIS — Z853 Personal history of malignant neoplasm of breast: Secondary | ICD-10-CM | POA: Diagnosis not present

## 2020-08-28 DIAGNOSIS — N179 Acute kidney failure, unspecified: Secondary | ICD-10-CM | POA: Diagnosis not present

## 2020-08-28 DIAGNOSIS — I130 Hypertensive heart and chronic kidney disease with heart failure and stage 1 through stage 4 chronic kidney disease, or unspecified chronic kidney disease: Secondary | ICD-10-CM | POA: Diagnosis not present

## 2020-08-28 DIAGNOSIS — R600 Localized edema: Secondary | ICD-10-CM

## 2020-08-28 DIAGNOSIS — R0602 Shortness of breath: Secondary | ICD-10-CM

## 2020-08-28 DIAGNOSIS — I482 Chronic atrial fibrillation, unspecified: Secondary | ICD-10-CM | POA: Diagnosis not present

## 2020-08-28 DIAGNOSIS — Z6841 Body Mass Index (BMI) 40.0 and over, adult: Secondary | ICD-10-CM | POA: Diagnosis not present

## 2020-08-28 DIAGNOSIS — Z7982 Long term (current) use of aspirin: Secondary | ICD-10-CM | POA: Diagnosis not present

## 2020-08-28 DIAGNOSIS — J811 Chronic pulmonary edema: Secondary | ICD-10-CM | POA: Diagnosis not present

## 2020-08-28 DIAGNOSIS — J90 Pleural effusion, not elsewhere classified: Secondary | ICD-10-CM | POA: Diagnosis not present

## 2020-08-28 DIAGNOSIS — Z79899 Other long term (current) drug therapy: Secondary | ICD-10-CM | POA: Diagnosis not present

## 2020-08-28 DIAGNOSIS — Z7984 Long term (current) use of oral hypoglycemic drugs: Secondary | ICD-10-CM | POA: Diagnosis not present

## 2020-08-28 DIAGNOSIS — J9621 Acute and chronic respiratory failure with hypoxia: Secondary | ICD-10-CM

## 2020-08-28 DIAGNOSIS — I509 Heart failure, unspecified: Secondary | ICD-10-CM | POA: Diagnosis not present

## 2020-08-28 DIAGNOSIS — D696 Thrombocytopenia, unspecified: Secondary | ICD-10-CM

## 2020-08-28 DIAGNOSIS — J9611 Chronic respiratory failure with hypoxia: Secondary | ICD-10-CM | POA: Diagnosis not present

## 2020-08-28 DIAGNOSIS — I5023 Acute on chronic systolic (congestive) heart failure: Secondary | ICD-10-CM | POA: Diagnosis not present

## 2020-08-28 DIAGNOSIS — I5021 Acute systolic (congestive) heart failure: Secondary | ICD-10-CM | POA: Diagnosis present

## 2020-08-28 DIAGNOSIS — I50810 Right heart failure, unspecified: Secondary | ICD-10-CM | POA: Diagnosis not present

## 2020-08-28 DIAGNOSIS — J918 Pleural effusion in other conditions classified elsewhere: Secondary | ICD-10-CM | POA: Diagnosis not present

## 2020-08-28 DIAGNOSIS — E1122 Type 2 diabetes mellitus with diabetic chronic kidney disease: Secondary | ICD-10-CM

## 2020-08-28 DIAGNOSIS — D61818 Other pancytopenia: Secondary | ICD-10-CM | POA: Diagnosis not present

## 2020-08-28 DIAGNOSIS — I361 Nonrheumatic tricuspid (valve) insufficiency: Secondary | ICD-10-CM | POA: Diagnosis not present

## 2020-08-28 DIAGNOSIS — D649 Anemia, unspecified: Secondary | ICD-10-CM

## 2020-08-28 DIAGNOSIS — Z8616 Personal history of COVID-19: Secondary | ICD-10-CM | POA: Diagnosis not present

## 2020-08-28 DIAGNOSIS — Z86711 Personal history of pulmonary embolism: Secondary | ICD-10-CM | POA: Diagnosis not present

## 2020-08-28 DIAGNOSIS — N1832 Chronic kidney disease, stage 3b: Secondary | ICD-10-CM | POA: Diagnosis not present

## 2020-08-28 DIAGNOSIS — I517 Cardiomegaly: Secondary | ICD-10-CM | POA: Diagnosis not present

## 2020-08-28 DIAGNOSIS — I4819 Other persistent atrial fibrillation: Secondary | ICD-10-CM | POA: Diagnosis not present

## 2020-08-28 DIAGNOSIS — Z87891 Personal history of nicotine dependence: Secondary | ICD-10-CM | POA: Diagnosis not present

## 2020-08-28 DIAGNOSIS — Z7901 Long term (current) use of anticoagulants: Secondary | ICD-10-CM

## 2020-08-28 DIAGNOSIS — N184 Chronic kidney disease, stage 4 (severe): Secondary | ICD-10-CM | POA: Diagnosis not present

## 2020-08-28 DIAGNOSIS — I272 Pulmonary hypertension, unspecified: Secondary | ICD-10-CM | POA: Diagnosis present

## 2020-08-28 DIAGNOSIS — I152 Hypertension secondary to endocrine disorders: Secondary | ICD-10-CM | POA: Diagnosis present

## 2020-08-28 DIAGNOSIS — M170 Bilateral primary osteoarthritis of knee: Secondary | ICD-10-CM

## 2020-08-28 DIAGNOSIS — J9811 Atelectasis: Secondary | ICD-10-CM | POA: Diagnosis not present

## 2020-08-28 DIAGNOSIS — Z9981 Dependence on supplemental oxygen: Secondary | ICD-10-CM | POA: Diagnosis not present

## 2020-08-28 DIAGNOSIS — I472 Ventricular tachycardia: Secondary | ICD-10-CM | POA: Diagnosis not present

## 2020-08-28 LAB — LACTATE DEHYDROGENASE: LDH: 232 U/L — ABNORMAL HIGH (ref 98–192)

## 2020-08-28 LAB — MAGNESIUM: Magnesium: 2.4 mg/dL (ref 1.7–2.4)

## 2020-08-28 LAB — BASIC METABOLIC PANEL
Anion gap: 12 (ref 5–15)
BUN: 40 mg/dL — ABNORMAL HIGH (ref 8–23)
CO2: 30 mmol/L (ref 22–32)
Calcium: 9.4 mg/dL (ref 8.9–10.3)
Chloride: 97 mmol/L — ABNORMAL LOW (ref 98–111)
Creatinine, Ser: 1.88 mg/dL — ABNORMAL HIGH (ref 0.44–1.00)
GFR calc Af Amer: 32 mL/min — ABNORMAL LOW (ref >60)
GFR calc non Af Amer: 27 mL/min — ABNORMAL LOW (ref >60)
Glucose, Bld: 168 mg/dL — ABNORMAL HIGH (ref 70–99)
Potassium: 4.8 mmol/L (ref 3.5–5.1)
Sodium: 139 mmol/L (ref 135–145)

## 2020-08-28 LAB — IRON AND TIBC
Iron: 70 ug/dL (ref 28–170)
Saturation Ratios: 22 % (ref 10.4–31.8)
TIBC: 318 ug/dL (ref 250–450)
UIBC: 248 ug/dL

## 2020-08-28 LAB — ECHOCARDIOGRAM COMPLETE
Height: 61 in
S' Lateral: 3.8 cm
Single Plane A4C EF: 33.7 %
Weight: 4158.76 oz

## 2020-08-28 LAB — FERRITIN: Ferritin: 108 ng/mL (ref 11–307)

## 2020-08-28 LAB — HEMOGLOBIN A1C
Hgb A1c MFr Bld: 7.5 % — ABNORMAL HIGH (ref 4.8–5.6)
Mean Plasma Glucose: 168.55 mg/dL

## 2020-08-28 LAB — GLUCOSE, CAPILLARY
Glucose-Capillary: 119 mg/dL — ABNORMAL HIGH (ref 70–99)
Glucose-Capillary: 107 mg/dL — ABNORMAL HIGH (ref 70–99)
Glucose-Capillary: 140 mg/dL — ABNORMAL HIGH (ref 70–99)

## 2020-08-28 LAB — BRAIN NATRIURETIC PEPTIDE: B Natriuretic Peptide: 673.5 pg/mL — ABNORMAL HIGH (ref 0.0–100.0)

## 2020-08-28 LAB — SARS CORONAVIRUS 2 BY RT PCR (HOSPITAL ORDER, PERFORMED IN ~~LOC~~ HOSPITAL LAB): SARS Coronavirus 2: NEGATIVE

## 2020-08-28 LAB — CBG MONITORING, ED: Glucose-Capillary: 152 mg/dL — ABNORMAL HIGH (ref 70–99)

## 2020-08-28 LAB — SAVE SMEAR(SSMR), FOR PROVIDER SLIDE REVIEW

## 2020-08-28 LAB — URIC ACID: Uric Acid, Serum: 7.8 mg/dL — ABNORMAL HIGH (ref 2.5–7.1)

## 2020-08-28 MED ORDER — ENOXAPARIN SODIUM 40 MG/0.4ML ~~LOC~~ SOLN
40.0000 mg | SUBCUTANEOUS | Status: DC
  Administered 2020-08-28 – 2020-09-08 (×12): 40 mg via SUBCUTANEOUS
  Filled 2020-08-28 (×12): qty 0.4

## 2020-08-28 MED ORDER — ALLOPURINOL 100 MG PO TABS
100.0000 mg | ORAL_TABLET | Freq: Two times a day (BID) | ORAL | Status: DC
  Administered 2020-08-28 – 2020-09-08 (×23): 100 mg via ORAL
  Filled 2020-08-28 (×24): qty 1

## 2020-08-28 MED ORDER — ASPIRIN EC 81 MG PO TBEC
81.0000 mg | DELAYED_RELEASE_TABLET | Freq: Every day | ORAL | Status: DC
  Administered 2020-08-28 – 2020-09-08 (×12): 81 mg via ORAL
  Filled 2020-08-28 (×12): qty 1

## 2020-08-28 MED ORDER — PERFLUTREN LIPID MICROSPHERE
1.0000 mL | INTRAVENOUS | Status: AC | PRN
  Administered 2020-08-28: 3 mL via INTRAVENOUS
  Filled 2020-08-28: qty 10

## 2020-08-28 MED ORDER — FUROSEMIDE 10 MG/ML IJ SOLN
80.0000 mg | Freq: Two times a day (BID) | INTRAMUSCULAR | Status: DC
  Administered 2020-08-28 – 2020-08-29 (×3): 80 mg via INTRAVENOUS
  Filled 2020-08-28 (×3): qty 8

## 2020-08-28 MED ORDER — CARVEDILOL 12.5 MG PO TABS
12.5000 mg | ORAL_TABLET | Freq: Two times a day (BID) | ORAL | Status: DC
  Administered 2020-08-28 – 2020-08-30 (×6): 12.5 mg via ORAL
  Filled 2020-08-28 (×6): qty 1

## 2020-08-28 MED ORDER — ONDANSETRON HCL 4 MG/2ML IJ SOLN: 4.0000 mg | Freq: Four times a day (QID) | INTRAMUSCULAR | Status: DC | PRN

## 2020-08-28 MED ORDER — ACETAMINOPHEN 325 MG PO TABS: 650.0000 mg | ORAL_TABLET | ORAL | Status: DC | PRN

## 2020-08-28 MED ORDER — SODIUM CHLORIDE 0.9% FLUSH
3.0000 mL | Freq: Two times a day (BID) | INTRAVENOUS | Status: DC
  Administered 2020-08-28: 3 mL via INTRAVENOUS

## 2020-08-28 MED ORDER — SODIUM CHLORIDE 0.9 % IV SOLN: 250.0000 mL | INTRAVENOUS | Status: DC | PRN

## 2020-08-28 MED ORDER — INSULIN ASPART 100 UNIT/ML ~~LOC~~ SOLN
0.0000 [IU] | Freq: Three times a day (TID) | SUBCUTANEOUS | Status: DC
  Administered 2020-08-28 – 2020-08-29 (×2): 3 [IU] via SUBCUTANEOUS
  Administered 2020-08-29: 2 [IU] via SUBCUTANEOUS
  Administered 2020-08-29 – 2020-08-30 (×2): 3 [IU] via SUBCUTANEOUS
  Administered 2020-08-30 (×2): 2 [IU] via SUBCUTANEOUS
  Administered 2020-08-31: 3 [IU] via SUBCUTANEOUS
  Administered 2020-08-31: 5 [IU] via SUBCUTANEOUS
  Administered 2020-08-31 – 2020-09-02 (×5): 3 [IU] via SUBCUTANEOUS
  Administered 2020-09-02: 2 [IU] via SUBCUTANEOUS
  Administered 2020-09-02: 3 [IU] via SUBCUTANEOUS
  Administered 2020-09-03 (×2): 2 [IU] via SUBCUTANEOUS
  Administered 2020-09-03 – 2020-09-04 (×2): 3 [IU] via SUBCUTANEOUS
  Administered 2020-09-04: 1 [IU] via SUBCUTANEOUS
  Administered 2020-09-05 (×2): 3 [IU] via SUBCUTANEOUS
  Administered 2020-09-05 – 2020-09-07 (×5): 2 [IU] via SUBCUTANEOUS
  Administered 2020-09-08: 3 [IU] via SUBCUTANEOUS

## 2020-08-28 MED ORDER — FUROSEMIDE 10 MG/ML IJ SOLN
40.0000 mg | Freq: Once | INTRAMUSCULAR | Status: AC
  Administered 2020-08-28: 40 mg via INTRAVENOUS
  Filled 2020-08-28: qty 4

## 2020-08-28 MED ORDER — PANTOPRAZOLE SODIUM 40 MG PO TBEC
40.0000 mg | DELAYED_RELEASE_TABLET | Freq: Every day | ORAL | Status: DC
  Administered 2020-08-28 – 2020-09-08 (×12): 40 mg via ORAL
  Filled 2020-08-28 (×12): qty 1

## 2020-08-28 MED ORDER — SODIUM CHLORIDE 0.9% FLUSH: 3.0000 mL | INTRAVENOUS | Status: DC | PRN

## 2020-08-28 NOTE — Hospital Course (Addendum)
Follow-up:  HFrEF with chronic respiratory failure: Patient to follow with [facility] Failure team as an outpatient for medication management. Patient weighs herself daily and has made efforts to improve diet. Please provide education on heart healthy diet and home medication titration based on daily weights.   Pancytopenia: See workup completed in hospital course below. Likely that patient will require a bone marrow biopsy. Consider outpatient heme-onc referral.   DM, HTN: Rita [name] would likely experience cardioprotective benefits from the addition of an SGLT2 and an ACE/ARB/ARNI to her home medications.     Acute on Chronic HFrEF with acute on chronic respiratory failure: Rita [name] presented to the [facility] on 9/19 with 3-4 days of worsening shortness of breath, increasing weight, and lower extremity edema. She was found to have a new-onset 2/6 systolic murmur. Echo showed no valvular pathology and an EF of 40-45%. Diastolic function was indeterminate. She was found to have a large R sided pleural effusion on CXR that was treated with a therapeutic thoracentesis. Laboratory evaluation of the effusion found that it was transudative in nature.  The heart failure team was consulted and she was given aggressive IV diuresis with a net recorded output of 12.1 liters and a 39 pound weight loss. Her dyspnea, abdominal and LE edema were all much improved. Catheterization toward the end of her hospital course was consistent with R>L heart failure and pulmonary venous hypertension.  Chronic Afib: Paroxysmal polymorphic ventricular tachycardia:  She remained in Afib throughout the hospitalization aside from one recorded instance of non-sustained, polymorphic ventricular tachycardia toward the end of her hospitalization. She had been on a decreased dose of her home beta blocker for several days at this point. In the setting of her known structural heart disease, it was determined that no further workup was  necessary and that she would be discharged on a higher dose of beta blocker.   Chronic normocytic anemia: Pancytopenia:  Rita [name] CBCs were consistent with pancytopenia. Given her history of cancer, chemotherapy, and radiation, limited hospital workup was performed including a peripheral smear with thrombocytopenia and circulating NRBCs. Reticulocyte percentage (1.3%) was found to be inappropriately low given her chronic anemia. Immature retic fraction was elevated at 19.5%. 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 UPEP was unremarkable and SPEP showed no M spike. The benefit of a bone marrow biopsy was discussed, but it was determined that this would be best handled on an outpatient basis

## 2020-08-28 NOTE — ED Provider Notes (Signed)
[place] EMERGENCY DEPARTMENT Provider Note   CSN: [id number] Arrival date & time: 08/27/20  1648     History Chief Complaint  Patient presents with  . Shortness of Breath    Rita [name] is a 64 y.o. female with a history of CHF last EF 35-40%, chronic respiratory failure on 2 L of oxygen via nasal cannula at baseline atrial fibrillation not on anticoagulation, CKD, diabetes mellitus, prior GI bleed, hypertension, and obesity who presents to the emergency department with complaints of shortness of breath which has been progressively worsening over the past 5 to 6 days.  Patient states that she feels constantly short of breath, this is worse when she is up and moving around as well as when she is laying flat.  No alleviating factors.  She feels persistently short of breath on her oxygen.  She is having trouble forming minute activities.  She has noted some increased swelling in her lower extremities bilaterally as well as to her lower abdomen.  This feels like her prior issues with CHF which have required admission.  She has been taking her Demadex as prescribed with the exception of today's dose as she was in the ER.  She denies fever, chills, cough, chest pain, or syncope.  She has received both doses of her COVID-19 vaccine.  HPI     Past Medical History:  Diagnosis Date  . Acute hypoxemic respiratory failure due to COVID-19 ([place]) 12/30/2019  . Anemia   . Arthritis   . Atrial fibrillation ([place])   . Chronic kidney disease   . Chronic respiratory failure with hypoxia, on home O2 therapy ([place])   . CKD (chronic kidney disease), stage III   . COVID-19   . Diabetes mellitus without complication ([place])   . Diastolic CHF ([place])   . GI bleed   . GI bleed   . Gout   . Hypertension   . Thrombocytopenia [facility])     Patient Active Problem List   Diagnosis Date Noted  . Gout 08/02/2020  . Constipation 08/02/2020  . Thrombocytopenia ([place]) 07/23/2020  .  Anemia 07/23/2020  . Acute on chronic combined systolic and diastolic heart failure ([place]) 07/21/2020  . Goiter   . Morbid obesity ([place]) 06/19/2020  . Neck mass 06/18/2020  . Acute respiratory failure with hypoxia ([place]) 06/16/2020  . Diabetes mellitus without complication ([place])   . AF (paroxysmal atrial fibrillation) ([place])   . CKD (chronic kidney disease), stage III   . Hypertension associated with diabetes [facility])     Past Surgical History:  Procedure Laterality Date  . IR THORACENTESIS ASP PLEURAL SPACE W/IMG GUIDE  06/28/2020     OB History   No obstetric history on file.     Family History  Problem Relation Age of Onset  . Cancer Mother     Social History   Tobacco Use  . Smoking status: Never Smoker  . Smokeless tobacco: Never Used  Substance Use Topics  . Alcohol use: Never  . Drug use: Never    Home Medications Prior to Admission medications   Medication Sig Start Date End Date Taking? Authorizing Provider  allopurinol (ZYLOPRIM) 100 MG tablet Take 100 mg by mouth 2 (two) times daily.    [provider]  aspirin EC 81 MG tablet Take 81 mg by mouth daily. Swallow whole.    [provider]  blood glucose meter kit and supplies Dispense based on patient and insurance preference. Use up to four  times daily as directed. (FOR ICD-10 E10.9, E11.9). 01/02/20   Ghimire, Henreitta Leber, MD  carvedilol (COREG) 25 MG tablet Take 12.5 mg by mouth 2 (two) times daily with a meal.     [provider]  ferrous sulfate 325 (65 FE) MG tablet Take 325 mg by mouth 2 (two) times daily with a meal.     [provider]  glipiZIDE (GLUCOTROL XL) 5 MG 24 hr tablet Take 5 mg by mouth daily with breakfast.  06/08/20   [provider]  metFORMIN (GLUCOPHAGE-XR) 500 MG 24 hr tablet Take 500 mg by mouth daily with breakfast.    [provider]  OXYGEN Inhale 2 L into the lungs continuous.    [provider]  pantoprazole (PROTONIX) 40 MG  tablet Take 40 mg by mouth daily.    [provider]  potassium chloride SA (KLOR-CON) 20 MEQ tablet Take 20 mEq by mouth daily.    [provider]  torsemide (DEMADEX) 20 MG tablet Take 1 tablet (20 mg total) by mouth daily. 07/26/20 08/25/20  Lacinda Axon, MD    Allergies    Codeine, Prednisone, and Moxifloxacin  Review of Systems   Review of Systems  Constitutional: Negative for chills and fever.  Respiratory: Positive for shortness of breath. Negative for cough.   Cardiovascular: Positive for leg swelling. Negative for chest pain.  Gastrointestinal: Positive for abdominal distention (Swelling). Negative for abdominal pain, blood in stool, constipation, diarrhea, nausea and vomiting.  Genitourinary: Negative for dysuria.  Neurological: Negative for dizziness and syncope.  All other systems reviewed and are negative.   Physical Exam Updated Vital Signs BP 116/73 (BP Location: Right Arm)   Pulse 78   Temp 98.6 F (37 C) (Oral)   Resp 20   Ht $R'5\' 1"'RW$  (1.549 m)   Wt 117.9 kg   SpO2 100%   BMI 49.11 kg/m   Physical Exam Vitals and nursing note reviewed.  Constitutional:      Appearance: She is well-developed. She is obese. She is not toxic-appearing.  HENT:     Head: Normocephalic and atraumatic.  Eyes:     General:        Right eye: No discharge.        Left eye: No discharge.     Conjunctiva/sclera: Conjunctivae normal.  Cardiovascular:     Comments: Irregularly irregular. Pulmonary:     Effort: No respiratory distress.     Breath sounds: Decreased breath sounds (throughout) and rhonchi (Bibasilar.) present. No wheezing or rales.     Comments: SPO2 95% on her baseline 2 L via nasal cannula at rest, however when she sat forward to allow me to listen to her lungs her SPO2 dropped to 88% and she became quite tachypneic. Abdominal:     Palpations: Abdomen is soft.     Tenderness: There is no abdominal tenderness. There is no guarding or rebound.      Comments: Trace pitting edema to the lower abdomen.  Musculoskeletal:     Cervical back: Neck supple.     Comments: 1+ symmetric pitting edema to the bilateral lower legs without overlying erythema or calf tenderness.  Skin:    General: Skin is warm and dry.     Findings: No rash.  Neurological:     Mental Status: She is alert.     Comments: Clear speech.   Psychiatric:        Behavior: Behavior normal.    ED Results / Procedures /  Treatments   Labs (all labs ordered are listed, but only abnormal results are displayed) Labs Reviewed  BASIC METABOLIC PANEL - Abnormal; Notable for the following components:      Result Value   Glucose, Bld 223 (*)    BUN 42 (*)    Creatinine, Ser 2.06 (*)    GFR calc non Af Amer 24 (*)    GFR calc Af Amer 28 (*)    All other components within normal limits  CBC - Abnormal; Notable for the following components:   RBC 3.41 (*)    Hemoglobin 8.9 (*)    HCT 29.4 (*)    RDW 18.5 (*)    Platelets 87 (*)    All other components within normal limits  TROPONIN I (HIGH SENSITIVITY) - Abnormal; Notable for the following components:   Troponin I (High Sensitivity) 139 (*)    All other components within normal limits  TROPONIN I (HIGH SENSITIVITY) - Abnormal; Notable for the following components:   Troponin I (High Sensitivity) 148 (*)    All other components within normal limits  SARS CORONAVIRUS 2 BY RT PCR (HOSPITAL ORDER, [place] [facility])  BRAIN NATRIURETIC PEPTIDE    EKG EKG Interpretation  Date/Time:  Sunday August 27 2020 16:49:36 EDT Ventricular Rate:  75 PR Interval:    QRS Duration: 116 QT Interval:  408 QTC Calculation: 455 R Axis:   137 Text Interpretation: Atrial fibrillation with premature ventricular or aberrantly conducted complexes Right axis deviation Nonspecific ST abnormality Abnormal ECG No significant change since last tracing Confirmed by Deno Etienne [phone]) on 08/28/2020 2:13:24  AM   Radiology DG Chest 2 View  Result Date: 08/27/2020 CLINICAL DATA:  Shortness of breath for 3 or 4 days. EXAM: CHEST - 2 VIEW COMPARISON:  July 19, 2020 FINDINGS: Stable cardiomegaly. The hila and mediastinum are unchanged. No pneumothorax. Pulmonary venous congestion. The opacity in the right base has increased in the interval, likely representing increasing pleural fluid with underlying opacity. No other interval changes. IMPRESSION: 1. Cardiomegaly and pulmonary venous congestion. 2. Increasing pleural effusion and underlying opacity on the right. Electronically Signed   By: Dorise Bullion III M.D   On: 08/27/2020 17:35    Procedures Procedures (including critical care time)  Medications Ordered in ED Medications  furosemide (LASIX) injection 40 mg (40 mg Intravenous Given 08/28/20 0252)    ED Course  I have reviewed the triage vital signs and the nursing notes.  Pertinent labs & imaging results that were available during my care of the patient were reviewed by me and considered in my medical decision making (see chart for details).    MDM Rules/Calculators/A&P                         Patient presents to the ED with complaints of dyspnea over the past 5 to 6 days.  On arrival she is nontoxic appearing, at rest she is saturating well on her baseline 2 L via nasal cannula, however when she repositions on the stretcher she desaturates into the 80s.  She bibasilar rales as well as decreased breath sounds throughout.  Peripheral edema noted.   DDx: CHF exacerbation, pneumonia, COVID-19, atypical ACS, pulmonary embolism, critical anemia.  Additional history obtained:  Additional history obtained from chart review nursing note review, most recent echocardiogram performed 07/20/2020- EF 35-40% & grade II diastolic dysfunction noted.   EKG: Afib, no significant change compared to prior.  Lab Tests:  I reviewed and interpreted labs, which included:  CBC: mild anemia similar to  prior. No leukocytosis. Mild worsening thrombocytopenia.  BMP: worsening renal function, hyperglycemia without acidosis or anion gap elevation. Electrolytes without significant derangement.   Imaging Studies ordered: CXR ordered per triage, I independently visualized and interpreted imaging which showed findings consistent w/ CHF-  1. Cardiomegaly and pulmonary venous congestion. 2. Increasing pleural effusion and underlying opacity on the right.  Troponins are elevated, flat, similar to prior, patient without chest pain or significant EKG changes, suspect this is secondary to demand. Clinical presentation consistent with acute CHF exacerbation, patient having mild hypoxia on her baseline oxygen requirement with minimal movement on the stretcher, she also has worsening renal function.  Plan for IV Lasix and discussion with medicine for admission which she is agreeable to.  This is a shared visit with supervising physician Dr. Tyrone Nine who has independently evaluated patient & is in agreement.  03:21: CONSULT: Discussed with internal medicine residency service- accept admission.    Portions of this note were generated with [employer]. Dictation errors may occur despite best attempts at proofreading.  Final Clinical Impression(s) / ED Diagnoses Final diagnoses:  Acute on chronic congestive heart failure, unspecified heart failure type [facility])    Rx / DC Orders ED Discharge Orders    None       Amaryllis Dyke, PA-C 08/28/20 0402    Deno Etienne, DO 08/28/20 0422

## 2020-08-28 NOTE — Progress Notes (Signed)
Subjective:  Rita [name] reports that she is feeling better this morning s/p Lasix x2, states that she is urinating "quite a bit." Her shortness of breath is improved. She denies any chest pain. She reports no side effects or other issues with the change to Torsemide after her last admission. States that she was doing very well up until 3-4 days ago. She weighs herself daily at home and had noticed her weight increasing in the days leading up to this admission but did not have written instructions for adjusting her diuretic doses in response to changes in her weight.  On further questioning regarding her cancer history, patient states that she had L breast cancer "about 30 years ago" that was treated with lumpectomy, radiation, and chemotherapy. She does not recall which chemo agents she was on.   Objective:  Vital signs in last 24 hours: Vitals:   08/28/20 0530 08/28/20 0645 08/28/20 0715 08/28/20 1000  BP: (!) 127/55 110/72 123/90 (!) 153/81  Pulse: 69 73 82 79  Resp: 18 19 (!) 21 20  Temp:      TempSrc:      SpO2: 99% 95% 95% 98%  Weight:      Height:       Weight change:  No intake or output data in the 24 hours ending 08/28/20 1041  Physical Exam Vitals and nursing note reviewed.  Constitutional:      General: She is not in acute distress. Cardiovascular:     Rate and Rhythm: Rhythm irregularly irregular.     Pulses:          Dorsalis pedis pulses are 1+ on the right side and 1+ on the left side.       Posterior tibial pulses are 1+ on the right side and 1+ on the left side.     Heart sounds: Murmur (best heard at LUSB) heard.  Systolic murmur is present with a grade of 2/6.   Pulmonary:     Effort: Pulmonary effort is normal.     Comments: Pulmonary exam limited as patient was having echo done. Abdominal:     Comments: Abdominal exam not performed as patient was having echo done.   Musculoskeletal:     Right lower leg: Edema (2+) present.     Left lower leg: Edema  (2+) present.     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   Psychiatric:        Mood and Affect: Mood normal.        Behavior: Behavior normal.     Assessment/Plan:  Principal Problem:   Acute exacerbation of congestive heart failure (HCC) Active Problems:   Acute systolic heart failure (HCC)  Acute on Chronic Combination Systolic and Diastolic HF: Acute on Chronic Hypoxic Respiratory Failure: Patient presented with 3 to 4 days of worsening shortness of breath and lower extremity edema.  Patient was hospitalized 1 month ago with similar symptoms and home meds were switched from Lasix 40mg  daily to torsemide 20mg  daily. Patient symptomatically improving s/p Lasix 40mg  IV x1 and 80 mg IV x1 in the ED. Shortness of breath much improved and patient breathing comfortably on room air during the interview. New 2/6 systolic murmur noted this admission. Patient hypervolemic on exam with significant abdominal and LE edema.  CXR with pulmonary venous congestion and bilateral pleural effusions. EKG with no new ischemic changes. Echo today with EF 40-45%, indeterminate diastolic function, moderately elevated pulmonary artery systolic pressure, no aortic stenosis, tricuspid  regurgitation, and trivial mitral regurgitation. BNP 673.5, similar to past admissions. Troponins 139 --> 148. Given echo with no valvular pathology and her hypervolemia, it is likely that her new-onset murmur is a flow murmur related to her volume overload. She is tolerating aggressive diuresis.  - Lasix 80 mg IV BID  - Continue home O2 of 2L - KCl 20mg  PO daily - Daily weights - Strict I/Os - BMP, Mg2+, Phos in the AM - Written instructions for home diuretic titration upon discharge  Chronic Atrial Fibrillation: EKG shows Afib at a rate of 75 bpm. 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  Currently on ASA 81mg  daily. Patient denies any CP, dizziness or palpitations. - Continue ASA 81mg  daily  - Telemetry  Thrombocytopenia: Chronic Normocytic Anemia: Thrombocytopenia of unknown origin in a patient with a history of cancer, chemotherapy, and radiation. Some workup done at last admission: abdominal US without splemomegaly, normal haptoglobin, and peripheral smear with normocytic anemia and thrombocytopenia. Less likely to be hemolytic in nature. In the setting of chronic anemia and persistently low-normal WBC, consider isolated thrombocytopenia vs. pancytopenia. Platelets 110 at discharge of last admission, 87 today. Hemoglobin 8.9 on admission. Patient has history of normocytic anemia and takes iron supplements (BID). Anemia likely 2/2 combination of IDA and anemia of chronic disease. Iron, Ferritin, and TIBC all WNL. Has a history of small bowel AVM's and reports dark stools 2/2 iron.  - Consider outpatient heme/onc follow-up - CBC w/ diff in AM - Ferrous sulfate 325mg  once daily  Improving AKI on CKD IIIb: Creatine 2.06 --> 1.88.  - Follow-up BMP in AM.   T2DM: Does not check blood sugar at home. Home meds are Metformin 500mg  in the morning only and glipizide 5mg  daily. A1c today 7.5. - Holding home meds. - SSI with moderate correction coverage - Monitor CBGs  HTN: BP well-controlled on home dose of carvedilol 12.5mg  BID.  - Continue home carvedilol  - Daily vitals  Osteoarthritis of the Knees: Chronic bilateral knee pain impacting patient mobility. Controlled at home with PRN Tylenol. - Tylenol 650mg  q4 PRN - PT/OT to see patient today for mobility.  Gout, currently in remission:  Stable. No active flareup. On allopurinol 100mg  BID at home. Uric acid marginally elevated at 7.8 mg/dL. - Continue home allopurinol 100 mg twice daily    LOS: 0 days   Rita [name], Medical Student 08/28/2020, 10:41 AM

## 2020-08-28 NOTE — Progress Notes (Signed)
  Echocardiogram 2D Echocardiogram has been performed.  Michiel Cowboy 08/28/2020, 9:14 AM

## 2020-08-28 NOTE — H&P (Addendum)
Date: 08/28/2020               Patient Name:  Rita [name] MRN: [id number]  DOB: Mar 25, 1956 Age / Sex: 64 y.o., female   PCP: [name], MD         Medical Service: Internal Medicine Teaching Service         Attending Physician: Dr. Oda Kilts, MD    First Contact: Dr. Jeralyn Bennett, MD Pager: [phone]  Second Contact: Dr. Linna Hoff, MD Pager: [phone]       After Hours (After 5p/  First Contact Pager: [phone]  weekends / holidays): Second Contact Pager: [phone]   Chief Complaint: Shortness of breath  History of Present Illness: Rita [name] is a 64 y.o. female with PMHx of T2DM, chronic Afib (on ASA), recent GI bleed April 2021 2/2 small bowel AVM's, HFrEF, pulmonary HTN, CKD stage III, HTN, anemia, L breast cancer s/p lumpectomy, gout, and arthritis of the knees presenting with 3-4 days of worsening SOB and swelling. Patient states she was feeling well for 3 weeks after discharge from the hospital a month ago. She had follow up with both internal medicine clinic and [facility] and had planned to follow with [facility] but was told she needs to follow with [facility] per her insurance company. This Friday, she began to have significant leg swelling and knee pain associated with shortness of breath. She took 1.5 pill of her torsemide with brief improvement in her symptoms but stopped taking the extra dose of the torsemide because she continued to 'feel bad. She mentions being adherent to her home meds prior to these symptoms, following up with a nutrition counseling and adherence to their recommendations regarding avoiding salt in her diet. She mentions eating [place] and tomato sandwich and baked chips the night before beginning of her symptoms.   On review of systems, she endorse abdominal and back pain due to the fluid build but denies any chest pain, palpitations, headache, dizziness, nausea, vomiting, or dysuria.   Meds:  No outpatient medications have  been marked as taking for the 08/27/20 encounter [facility] Encounter).  Allopurinol 100 mg twice daily Aspirin 81 mg daily Carvedilol 25 mg daily Ferrous sulfate 325 mg twice daily with meals Glipizide 5 mg daily Metformin 500 mg daily with breakfast Oxygen 2 L Pantoprazole 40 mg daily Potassium chloride 20 mEq daily Torsemide 20 mg daily  Allergies: Allergies as of 08/27/2020 - Review Complete 07/19/2020  Allergen Reaction Noted  . Codeine Other (See Comments) 12/30/2019  . Prednisone Other (See Comments) 12/30/2019  . Moxifloxacin Rash 12/30/2019   Past Medical History:  Diagnosis Date  . Acute hypoxemic respiratory failure due to COVID-19 ([place]) 12/30/2019  . Anemia   . Arthritis   . Atrial fibrillation ([place])   . Chronic kidney disease   . Chronic respiratory failure with hypoxia, on home O2 therapy ([place])   . CKD (chronic kidney disease), stage III   . COVID-19   . Diabetes mellitus without complication ([place])   . Diastolic CHF ([place])   . GI bleed   . GI bleed   . Gout   . Hypertension   . Thrombocytopenia ([place])     Family History: History of cancer in mother  Social History: Patient states that she has not smoked cigarettes since high school and denies any alcohol or other drug use. She lives at home with her husband.   Review of Systems: A complete ROS was  negative except as per HPI.   Physical Exam: Blood pressure (!) 146/95, pulse 62, temperature 98.6 F (37 C), temperature source Oral, resp. rate (!) 27, height 5\' 1"  (1.549 m), weight 117.9 kg, SpO2 94 %.  General: Well-appearing elderly woman laying in bed on 2 L [place]. No acute distress. Head: Normocephalic. Atraumatic. Neck: JVD to the mid neck. Supple. Normal ROM. CV:  Regular rate.  Irregularly irregular rhythm. II/VI blowing systolic murmur heard best along LUSB. No rubs or gallops. 2+ pitting edema, bilaterally to the thighs with chronic venous stasis changes of the BLEs. Pulmonary:  Lungs CTAB.  Distant  lung sounds. Bibasilar rales bilaterally. No wheezing. Abdominal: Soft. Distended with fluid retention  and induration of the skin. Normal bowel sounds. No tenderness. No organomegaly. Extremities: Faint dorsalis pedis pulses bilaterally. Chronic venous stasis changes of the bilateral lower extremities. Skin: Warm and dry. Hyperpigmentation of anterior aspect of bilateral shins but without lesions. Neuro: A&O x3. Moves all extremities. Normal sensation. No focal deficits.  Psych: Normal mood and affect.   EKG: personally reviewed my interpretation is A. fib with premature ventricular complexes.  No significant change from previous EKG from last hospitalization.  CXR: personally reviewed my interpretation is cardiomegaly and pulmonary venous congestion with increased pleural effusion.  Assessment & Plan by Problem: Principal Problem:   Acute exacerbation of congestive heart failure (HCC) Active Problems:   Acute systolic heart failure ([place])  Rita [name] is a 64 y.o. female with PMHx of T2DM, chronic Afib (on ASA), recent GI bleed April 2021 2/2 small bowel AVM's, HFrEF, pulmonary HTN, CKD stage III, HTN, anemia, L breast cancer s/p lumpectomy, gout, and arthritis of the knees who is found to be in acute exacerbation of HF and currently undergoing diuresis.   Acute on Chronic Combination Systolic and Diastolic HF Patient presented with 3 to 4 days of worsening shortness of breath and lower extremity edema.  Patient was hospitalized 1 month ago with similar symptoms. Patient has gained 10 pounds since last admission. CXR shows pulmonary venous congestion with bilateral pleural effusions. EKG shows Afib. Last ECHO showed EF of 35 to 40% with grade 2 diastolic dysfunction. BNP pending. Troponin I 139 > 148. S/p 40 mg Lasix IV. Reported dry weight is about 256lbs. Bibasilar rales, abdominal edema and 2+ BLE edema on exam. --Lasix 80mg  IV BID --KCl CR 20mg  PO daily --F/u BNP  --AM BMP, Mg2+,  Phos --Daily weights  --Strict I&O's --Consider f/u with cardiology after d/c --Patient will need education on how to titrate diuretics at home based on changes in weight.   Acute on Chronic Hypoxic Respiratory Failure Patient is on chronic 2L [place] oxygen continuously at home that is increased to 3L when she exerts herself. Satting well on 2 L [place] on arrival. Likely secondary to acute exacerbation of heart failure in addition to pulmonary hypertension. --Supplemental oxygen with goal SpO2 > 92%  --Pulse oximetry  Chronic Atrial Fibrillation EKG shows Afib at a rate of 75 bpm. 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 Currently on ASA 81mg  daily. Patient denies any CP, dizziness or palpitations. --Continue ASA 81mg  daily  --Telemetry  Chronic Normocytic Anemia Hemoglobin 8.9 on admission. Patient has history of normocytic anemia and takes iron supplements (BID). Anemia 2/2 combination of IDA and anemia of chronic disease. Has a history of small bowel AVM's and reports dark stools 2/2 iron.  --F/u  iron, ferritin and TIBC labs --CBC w/ diff --Ferrous sulfate 325mg  once daily  Thrombocytopenia Patient with low platelet levels at recent hospital admissions.U/S abdomen did not show any evidence of splenomegaly during last admission.Peripheral smear showed normocytic anemia and thrombocytopenia.Platelet at discharge was 110 and platelet on admission is 87. --F/u morning CBC --Consider further workup for possible hemolytic anemia  AKI on CKD stage IIIb Creatinine 2.06, BUN 42, GFR 28. Patient denies any urinary symptoms.  --F/u morning BMP   T2DM Does not check blood sugars at home. Last HgbA1c of 6.1 in May 2021. On glipizide 5mg  daily and Metformin 500 mg in the mornings only. Serum glucose of 223 on arrival. --Holding Metformin and glipizide --SSI with moderate correction  coverage --Monitor CBGs  HTN BP of 121/61 on admission. Blood pressure well-controlled on home dose of carvedilol 12.5mg  BID. --Continue carvedilol 12.5mg  BID --Daily vitals  Osteoarthritis of the Knees Patient endorses bilateral knee pain. Takes Tylenol for arthritis PRN at home.  --Tylenol 650mg  q4hrs PRN  --PT/OT eval  Gout, currently in remission Stable. No active flareup. On allopurinol 100mg  BID at home.  --Continue home allopurinol 100 mg twice daily --F/u uric acid levels, adjust med as necessary  Diet: Heart healthy/carb modified DVT PPx: Enoxaparin 40mg  SQ daily Code Status: Full Code  Dispo: Admit patient to Inpatient with expected length of stay greater than 2 midnights.  Signed: Lacinda Axon, MD 08/28/2020, 4:43 AM  Pager: [phone] Internal Medicine Teaching Service After 5pm on weekdays and 1pm on weekends: On Call pager: [phone]

## 2020-08-29 DIAGNOSIS — J9611 Chronic respiratory failure with hypoxia: Secondary | ICD-10-CM

## 2020-08-29 DIAGNOSIS — I482 Chronic atrial fibrillation, unspecified: Secondary | ICD-10-CM

## 2020-08-29 DIAGNOSIS — I5043 Acute on chronic combined systolic (congestive) and diastolic (congestive) heart failure: Secondary | ICD-10-CM

## 2020-08-29 LAB — BASIC METABOLIC PANEL
Anion gap: 8 (ref 5–15)
BUN: 38 mg/dL — ABNORMAL HIGH (ref 8–23)
CO2: 33 mmol/L — ABNORMAL HIGH (ref 22–32)
Calcium: 9.3 mg/dL (ref 8.9–10.3)
Chloride: 98 mmol/L (ref 98–111)
Creatinine, Ser: 1.93 mg/dL — ABNORMAL HIGH (ref 0.44–1.00)
GFR calc Af Amer: 31 mL/min — ABNORMAL LOW (ref >60)
GFR calc non Af Amer: 26 mL/min — ABNORMAL LOW (ref >60)
Glucose, Bld: 151 mg/dL — ABNORMAL HIGH (ref 70–99)
Potassium: 4 mmol/L (ref 3.5–5.1)
Sodium: 139 mmol/L (ref 135–145)

## 2020-08-29 LAB — PHOSPHORUS: Phosphorus: 4.3 mg/dL (ref 2.5–4.6)

## 2020-08-29 LAB — CBC WITH DIFFERENTIAL/PLATELET
Abs Immature Granulocytes: 0.04 10*3/uL (ref 0.00–0.07)
Basophils Absolute: 0 10*3/uL (ref 0.0–0.1)
Basophils Relative: 1 %
Eosinophils Absolute: 0.2 10*3/uL (ref 0.0–0.5)
Eosinophils Relative: 4 %
HCT: 27 % — ABNORMAL LOW (ref 36.0–46.0)
Hemoglobin: 8.3 g/dL — ABNORMAL LOW (ref 12.0–15.0)
Immature Granulocytes: 1 %
Lymphocytes Relative: 21 %
Lymphs Abs: 0.8 10*3/uL (ref 0.7–4.0)
MCH: 25.9 pg — ABNORMAL LOW (ref 26.0–34.0)
MCHC: 30.7 g/dL (ref 30.0–36.0)
MCV: 84.4 fL (ref 80.0–100.0)
Monocytes Absolute: 0.5 10*3/uL (ref 0.1–1.0)
Monocytes Relative: 11 %
Neutro Abs: 2.5 10*3/uL (ref 1.7–7.7)
Neutrophils Relative %: 62 %
Platelets: 84 10*3/uL — ABNORMAL LOW (ref 150–400)
RBC: 3.2 MIL/uL — ABNORMAL LOW (ref 3.87–5.11)
RDW: 18 % — ABNORMAL HIGH (ref 11.5–15.5)
WBC: 4 10*3/uL (ref 4.0–10.5)
nRBC: 0 % (ref 0.0–0.2)

## 2020-08-29 LAB — GLUCOSE, CAPILLARY
Glucose-Capillary: 139 mg/dL — ABNORMAL HIGH (ref 70–99)
Glucose-Capillary: 140 mg/dL — ABNORMAL HIGH (ref 70–99)
Glucose-Capillary: 171 mg/dL — ABNORMAL HIGH (ref 70–99)
Glucose-Capillary: 184 mg/dL — ABNORMAL HIGH (ref 70–99)

## 2020-08-29 LAB — RETICULOCYTES
Immature Retic Fract: 19.5 % — ABNORMAL HIGH (ref 2.3–15.9)
RBC.: 3.19 MIL/uL — ABNORMAL LOW (ref 3.87–5.11)
Retic Count, Absolute: 39.9 10*3/uL (ref 19.0–186.0)
Retic Ct Pct: 1.3 % (ref 0.4–3.1)

## 2020-08-29 LAB — MAGNESIUM: Magnesium: 2.2 mg/dL (ref 1.7–2.4)

## 2020-08-29 LAB — IMMATURE PLATELET FRACTION: Immature Platelet Fraction: 3.2 % (ref 1.2–8.6)

## 2020-08-29 MED ORDER — SODIUM CHLORIDE 0.9% FLUSH: 3.0000 mL | Freq: Two times a day (BID) | INTRAVENOUS | Status: DC

## 2020-08-29 MED ORDER — POLYETHYLENE GLYCOL 3350 17 G PO PACK: 17.0000 g | PACK | Freq: Every day | ORAL | Status: DC | PRN

## 2020-08-29 MED ORDER — FUROSEMIDE 10 MG/ML IJ SOLN
100.0000 mg | Freq: Two times a day (BID) | INTRAVENOUS | Status: DC
  Administered 2020-08-29: 100 mg via INTRAVENOUS
  Filled 2020-08-29 (×2): qty 10

## 2020-08-29 MED ORDER — SODIUM CHLORIDE 0.9 % IV SOLN
INTRAVENOUS | Status: DC | PRN
  Administered 2020-08-29: 1000 mL via INTRAVENOUS

## 2020-08-29 MED ORDER — POLYETHYLENE GLYCOL 3350 17 G PO PACK
17.0000 g | PACK | Freq: Two times a day (BID) | ORAL | Status: AC
  Administered 2020-08-29 (×2): 17 g via ORAL
  Filled 2020-08-29 (×2): qty 1

## 2020-08-29 MED ORDER — SODIUM CHLORIDE 0.9% FLUSH: 3.0000 mL | INTRAVENOUS | Status: DC | PRN

## 2020-08-29 MED ORDER — FUROSEMIDE 10 MG/ML IJ SOLN
20.0000 mg/h | INTRAVENOUS | Status: DC
  Administered 2020-08-29 – 2020-08-30 (×2): 10 mg/h via INTRAVENOUS
  Administered 2020-08-31: 15 mg/h via INTRAVENOUS
  Administered 2020-09-01 – 2020-09-06 (×9): 20 mg/h via INTRAVENOUS
  Filled 2020-08-29 (×4): qty 25
  Filled 2020-08-29: qty 21
  Filled 2020-08-29: qty 20
  Filled 2020-08-29 (×4): qty 25
  Filled 2020-08-29: qty 21
  Filled 2020-08-29 (×5): qty 25

## 2020-08-29 MED ORDER — SODIUM CHLORIDE 0.9 % IV SOLN: 250.0000 mL | INTRAVENOUS | Status: DC | PRN

## 2020-08-29 NOTE — H&P (Deleted)
Entered in error. See consult note.  

## 2020-08-29 NOTE — Progress Notes (Signed)
OT Cancellation Note  Patient Details Name: Carmon Brigandi MRN: [id number] DOB: July 21, 1956   Cancelled Treatment:    Reason Eval/Treat Not Completed: Patient at procedure or test/ unavailable.  Patient sitting bedside, new IV being placed.  OT to continue efforts.    Briggitte Boline D Shalayna Ornstein 08/29/2020, 4:16 PM  08/29/2020  Denice Paradise, OTR/L  Acute Rehabilitation Services  Office:  [phone]

## 2020-08-29 NOTE — Progress Notes (Signed)
 Subjective:  States she is not urinating much despite Lasix, reports difficulty with Purewick and has urinated in the bed several times so our I/O are inaccurate. However, she feels that she is not diuresing as quickly as she did during her last hospitalization.  SOB is improving but she states that her edema is unchanged from yesterday. Patient complains of new nausea with onset this morning. Denies vomiting, CP, abdominal pain. States that she feels bloated and is passing a lot of gas.   Last BM was 3 days ago.   Objective:  Vital signs in last 24 hours: Vitals:   08/29/20 0843 08/29/20 0917 08/29/20 1140 08/29/20 1142  BP: (!) 112/54 107/62 (!) 114/57 107/64  Pulse: 60 67 78 66  Resp: 18 18  17  Temp:  98.3 F (36.8 C)  98.5 F (36.9 C)  TempSrc:  Oral  Oral  SpO2: 97% 97% 96% 97%  Weight:      Height:       Weight change: 0.091 kg  Intake/Output Summary (Last 24 hours) at 08/29/2020 1258 Last data filed at 08/28/2020 1951 Gross per 24 hour  Intake 120 ml  Output --  Net 120 ml   Physical Exam Vitals and nursing note reviewed.  Constitutional:      General: She is not in acute distress.    Interventions: Nasal cannula in place.     Comments: 2L nasal cannula  Cardiovascular:     Rate and Rhythm: Rhythm irregularly irregular.     Pulses:          Dorsalis pedis pulses are 1+ on the right side and 1+ on the left side.       Posterior tibial pulses are 1+ on the right side and 1+ on the left side.     Heart sounds: Murmur (heard best at LUSB) heard.  Systolic murmur is present with a grade of 2/6.   Pulmonary:     Effort: Pulmonary effort is normal.     Breath sounds: Examination of the right-lower field reveals decreased breath sounds and rales. Examination of the left-lower field reveals decreased breath sounds and rales. Decreased breath sounds and rales present.  Abdominal:     Palpations: Abdomen is soft.     Tenderness: There is no abdominal tenderness.      Comments: Abdomen with diffuse skin thickening consistent with stasis dermatitis  Musculoskeletal:     Right lower leg: 2+ Pitting Edema present.     Left lower leg: 2+ Pitting Edema present.  Skin:    Comments: Bilateral lower extremities with venous stasis dermatitis  Neurological:     Mental Status: She is alert.     Assessment/Plan:  Principal Problem:   Acute exacerbation of congestive heart failure (HCC) Active Problems:   Diabetes mellitus without complication (HCC)   Persistent atrial fibrillation (HCC)   CKD (chronic kidney disease), stage III   Hypertension associated with diabetes (HCC)   Morbid obesity (HCC)   Thrombocytopenia (HCC)   Anemia   Acute systolic heart failure (HCC)   Pulmonary hypertension (HCC)   Acute on Chronic Combination Systolic and Diastolic HF: Acute on Chronic Hypoxic Respiratory Failure: Patient presented with 3 to 4 days of worsening shortness of breath and lower extremity edema. Patient was hospitalized 1 month ago with similar symptoms and home meds were switched from Lasix 40mg daily to torsemide 20mg daily. Her shortness of breath is improved on Lasix 80mg BID, but she is still volume overloaded on   exam and not diuresing as well as we would like. Weight unchanged from admission.  CXR with pulmonary venous congestion and bilateral pleural effusions. EKG with no new ischemic changes but low voltage. Echo today with EF 40-45%, indeterminate diastolic function, moderately elevated pulmonary artery systolic pressure, no aortic stenosis, tricuspid regurgitation, and trivial mitral regurgitation. BNP 673.5, similar to past admissions. Troponins 139 --> 148.  Given low voltage EKG and HFrEF of uncertain etiology in the setting of her abnormal blood counts, consider the possibility of infiltrative process and the need for cardiac MRI. - Consult cardiology for their opinion on cardiac MRI - Increase Lasix to 100mg IV BID - Consider adding metolazone if  urine output does not increase with Lasix - Continue home O2 of 2L - KCl 20mg PO daily - Daily weights - Strict I/Os - Written instructions for home diuretic titration upon discharge  Thrombocytopenia: Chronic Normocytic Anemia: Thrombocytopenia of unknown origin in a patient with a history of cancer, chemotherapy, and radiation. Some workup done at last admission: abdominal US without splemomegaly, normal haptoglobin, and peripheral smear with normocytic anemia and thrombocytopenia. Has ahistory of small bowel AVM's and reports dark stools 2/2 iron.Peripheral smear pending.  Less likely to be hemolytic in nature. In the setting of chronic anemia and persistently low-normal WBC, consider isolated thrombocytopenia vs. pancytopenia. Platelets 110 at discharge of last admission, 84 today. Hemoglobin8.9 on admission. Patient has history of normocytic anemia and takes iron supplements (BID). Iron, Ferritin, and TIBC all WNL. Reticulocyte percentage (1.3%) inappropriately low given her anemia. Increased immature retic fraction at 19.5%. Given these findings, she likely warrants a bone marrow biopsy after this hospitalization, but this appears chronic in nature and is something that can be worked up on an outpatient basis.  - Outpatient heme/onc follow-up - Ferrous sulfate 325mg once daily - Peripheral smear pending  Nausea: Constipation: New-onset nausea and bloating without vomiting or abdominal pain. Last bowel movement three days ago. - Zofran prn for nausea - Miralax to help with bowel movement  Chronic Atrial Fibrillation: EKG with Afib anda rate of 75 bpm. Previously onXarelto, butdiscontinuedafter she developedprofuse GI bleeding in April 2021 and was found to have multiple small bowel AVM's.CurrentlyonASA 81mg daily.Patient denies any CP, dizziness orpalpitations. - Continue ASA 81mg daily  - Telemetry - Consider possible cardioversion  Acute on chronic CKD vs. Progression  of CKD IIIb: Creatinine 2.06 --> 1.93 (baseline 1.59) - Follow-up BMP in AM.    T2DM: Does not check blood sugar at home. Home meds are Metformin 500mg in the morning only and glipizide 5mg daily. A1c 7.5. - Hold home meds for now - Consider changing home regimen to optimize cardiac protection  - SSI with moderate correction coverage - Monitor CBGs  HTN: BP well-controlled on home dose of carvedilol 12.5mg BID.  - Continue home carvedilol  - Consider adding ACEI/ARB/ARNI for cardioprotective effects - Daily vitals  Osteoarthritis of the Knees: Chronic bilateral knee pain impacting patient mobility. Controlled at home with PRN Tylenol. Seen by PT, who recommended home help PT.  - Tylenol 650mg q4 PRN - Home health PT on discharge.   Gout, currently in remission:  Stable. No active flareup. Onallopurinol 100mg BID at home. Uric acid marginally elevated at 7.8 mg/dL. - Continue home allopurinol 100 mg twice daily   LOS: 1 day   [place], [place], Medical Student 08/29/2020, 12:58 PM  

## 2020-08-29 NOTE — Consult Note (Addendum)
Advanced Heart Failure Team Consult Note   Primary Physician: [name], MD PCP-Cardiologist:  No primary care provider on file.  Reason for Consultation: Acute on Chronic Combined Systolic and Diastolic Heart Failure   HPI:    Rita [name] is seen today for evaluation of acute on chronic combined systolic and diastolic CHF at the request of Dr. Rebeca Alert, Internal Medicine.   Patient is a 64 year old African-American female with history of COVID 19 infection, chronic combined systolic and diastolic heart failure, persistent atrial fibrillation no longer on anticoagulation due to history of GI bleeding and chronic anemia, stage III CKD, hypertension, type 2 diabetes, history of provoked DVT/PE following orthopedic surgery s/p IVC filter, chronic hypoxic respiratory failure 2L/min home O2 baseline, obesity, GERD and prior h/o left sided breast cancer 30 years ago, treated w/ surgery, chemo + radiation.   She has had multiple hospitalizations in the last year.  She was admitted January 2021 for COVID-19 pneumonia w/ acute hypoxic respiratory failure.  She did not require intubation.  She was treated with steroids and remdesivir.  She was readmitted twice in 7/21 for CHF exacerbations complicated by pleural effusion requiring IR guided thoracentesis.  She was readmitted again 8/21 for recurrent CHF and diuresed with IV Lasix.  Echo 8/21 showed moderately reduced systolic function, EF 35 to 40% with mild concentric left ventricular hypertrophy and grade 2 diastolic dysfunction.  There is mild hypokinesis of the left ventricular, apical segment.  RV systolic function was normal.  No prior study for comparison.  I do not see where she has ever had ischemic work-up  She was readmitted again yesterday for recurrent CHF exacerbation plus AKI on CKD.  NYHA class IIIb.  Previous creatinine last admit was 1.76.  Creatinine on this admission elevated at 2.06. Chest x-ray demonstrated  cardiomegaly and pulmonary venous congestion with recurrent right-sided pleural effusion.  BNP 673.  CBC shows chronic anemia, hemoglobin 8.9.  Also with worsening thrombocytopenia.  Platelets down from 122K last month down to 84K today. Echo repeated. EF 40-45%. RV ok. RVSP 51 mmHg. Net I/Os this admit only -430cc. SCr trend: 2.06>>1.88>>1.93. CO2 33. K 4.0. No dyspnea at rest. Denies CP.    Echo 8/21 EF 35-40%, mod HK, G2DD, RV normal Echo 9/21 EF 40-45%, moderately elevated pulmonary artery systolic pressure,  12.1 mmHg. RV ok.   Review of Systems: [y] = yes, _0  = no   . General: Weight gain [ Y]; Weight loss _1 ; Anorexia _2 ; Fatigue [Y ]; Fever _3 ; Chills _4 ; Weakness [Y ]  . Cardiac: Chest pain/pressure _5 ; Resting SOB _6 ; Exertional SOB [Y ]; Orthopnea [ Y]; Pedal Edema [Y ]; Palpitations _7 ; Syncope _8 ; Presyncope _9 ; Paroxysmal nocturnal dyspnea_10   . Pulmonary: Cough [ Y]; Wheezing_11 ; Hemoptysis_12 ; Sputum _13 ; Snoring [Y ]  . GI: Vomiting_14 ; Dysphagia_15 ; Melena_16 ; Hematochezia _17 ; Heartburn_18 ; Abdominal pain _19 ; Constipation _20 ; Diarrhea _21 ; BRBPR _22   . GU: Hematuria_23 ; Dysuria _24 ; Nocturia_25   . Vascular: Pain in legs with walking _26 ; Pain in feet with lying flat _27 ; Non-healing sores _28 ; Stroke _29 ; TIA _30 ; Slurred speech _31 ;  . Neuro: Headaches_32 ; Vertigo_33 ; Seizures_34 ; Paresthesias_35 ;Blurred vision _36 ; Diplopia _37 ; Vision changes _38   . Ortho/Skin: Arthritis _39 ; Joint pain [ Y]; Muscle pain _40 ;  Joint swelling _0 ; Back Pain _1 ; Rash _2   . Psych: Depression_3 ; Anxiety_4   . Heme: Bleeding problems [ Y]; Clotting disorders _5 ; Anemia [ Y]  . Endocrine: Diabetes [Y ]; Thyroid dysfunction_6   Home Medications Prior to Admission medications   Medication Sig Start Date End Date Taking? Authorizing Provider  acetaminophen (TYLENOL) 325 MG tablet Take 650 mg by mouth every 6 (six) hours as needed for mild pain or headache.   Yes [provider]  allopurinol (ZYLOPRIM) 100 MG tablet Take 100 mg by mouth 2 (two) times daily.   Yes [provider]  aspirin EC 81 MG tablet Take 81 mg by mouth daily. Swallow whole.   Yes [provider]  carvedilol (COREG) 25 MG tablet Take 12.5 mg by mouth 2 (two) times daily with a meal.    Yes [provider]  ferrous sulfate 325 (65 FE) MG tablet Take 325 mg by mouth 2 (two) times daily with a meal.    Yes [provider]  glipiZIDE (GLUCOTROL XL) 5 MG 24 hr tablet Take 5 mg by mouth daily with breakfast.  06/08/20  Yes [provider]  Menthol-Methyl Salicylate (MUSCLE RUB) 10-15 % CREA Apply 1 application topically as needed for muscle pain (knees).    Yes [provider]  metFORMIN (GLUCOPHAGE-XR) 500 MG 24 hr tablet Take 500 mg by mouth daily with breakfast.   Yes [provider]  Multiple Vitamin (MULTIVITAMIN WITH MINERALS) TABS tablet Take 2 tablets by mouth daily. Alive gummy bears   Yes [provider]  OXYGEN Inhale 2 L into the lungs continuous.   Yes [provider]  pantoprazole (PROTONIX) 40 MG tablet Take 40 mg by mouth daily.   Yes [provider]  potassium chloride SA (KLOR-CON) 20 MEQ tablet Take 20 mEq by mouth daily.   Yes [provider]  torsemide (DEMADEX) 20 MG tablet Take 1 tablet (20 mg total) by mouth daily. 07/26/20 08/28/20 Yes Amponsah, Charisse March, MD  blood glucose meter kit and supplies Dispense based on patient and insurance preference. Use up to four times daily as directed. (FOR ICD-10 E10.9, E11.9). 01/02/20   Ghimire, Henreitta Leber, MD    Past Medical History: Past Medical History:  Diagnosis Date  . Acute hypoxemic respiratory failure due to COVID-19 ([place]) 12/30/2019  . Anemia   . Arthritis   . Atrial fibrillation ([place])   . Chronic kidney disease   . Chronic respiratory failure with hypoxia, on home O2 therapy ([place])   . CKD (chronic kidney disease), stage III    . COVID-19   . Diabetes mellitus without complication ([place])   . Diastolic CHF ([place])   . GI bleed   . GI bleed   . Gout   . Hypertension   . Thrombocytopenia ([place])     Past Surgical History: Past Surgical History:  Procedure Laterality Date  . IR THORACENTESIS ASP PLEURAL SPACE W/IMG GUIDE  06/28/2020    Family History: Family History  Problem Relation Age of Onset  . Cancer Mother     Social History: Social History   Socioeconomic History  . Marital status: Married    Spouse name: Not on file  . Number of children: Not on file  . Years of education: Not on file  . Highest education level: Not on file  Occupational History  . Not on file  Tobacco Use  . Smoking status: Never Smoker  . Smokeless  tobacco: Never Used  Substance and Sexual Activity  . Alcohol use: Never  . Drug use: Never  . Sexual activity: Not on file  Other Topics Concern  . Not on file  Social History Narrative  . Not on file   Social Determinants of Health   Financial Resource Strain:   . Difficulty of Paying Living Expenses: Not on file  Food Insecurity:   . Worried About Charity fundraiser in the Last Year: Not on file  . Ran Out of Food in the Last Year: Not on file  Transportation Needs: No Transportation Needs  . Lack of Transportation (Medical): No  . Lack of Transportation (Non-Medical): No  Physical Activity:   . Days of Exercise per Week: Not on file  . Minutes of Exercise per Session: Not on file  Stress:   . Feeling of Stress : Not on file  Social Connections: Socially Integrated  . Frequency of Communication with Friends and Family: More than three times a week  . Frequency of Social Gatherings with Friends and Family: Once a week  . Attends Religious Services: 1 to 4 times per year  . Active Member of Clubs or Organizations: No  . Attends Archivist Meetings: 1 to 4 times per year  . Marital Status: Married    Allergies:  Allergies  Allergen Reactions  .  Codeine Other (See Comments)    Mouth sores   . Prednisone Other (See Comments)    Delusions   . Moxifloxacin Rash    Objective:    Vital Signs:   Temp:  [97.5 F (36.4 C)-98.5 F (36.9 C)] 97.8 F (36.6 C) (09/21 1636) Pulse Rate:  [52-89] 52 (09/21 1636) Resp:  [16-18] 18 (09/21 1636) BP: (104-127)/(50-64) 110/59 (09/21 1636) SpO2:  [92 %-98 %] 98 % (09/21 1636) Weight:  [381 kg] 118 kg (09/21 0326) Last BM Date: 08/27/20  Weight change: Filed Weights   08/27/20 1653 08/27/20 1702 08/29/20 0326  Weight: 117.9 kg 117.9 kg 118 kg    Intake/Output:   Intake/Output Summary (Last 24 hours) at 08/29/2020 1839 Last data filed at 08/28/2020 1951 Gross per 24 hour  Intake 120 ml  Output --  Net 120 ml      Physical Exam    General:  Obese and fatigue appearing. No resp difficulty HEENT: dentition in poor repair, otherwise normal Neck: supple. JVP elevated to ear . Carotids 2+ bilat; no bruits. No lymphadenopathy or thyromegaly appreciated. Cor: PMI nondisplaced. Irregularly irregular rhythm and rate. No rubs, gallops or murmurs. Lungs: decreased BS at the bases R>L  Abdomen: obese, soft, nontender, nondistended. No hepatosplenomegaly. No bruits or masses. Good bowel sounds. Extremities: no cyanosis, clubbing, rash, obese lower extremities w/ trace-1+ bilateral edema Neuro: alert & orientedx3, cranial nerves grossly intact. moves all 4 extremities w/o difficulty. Affect pleasant   Telemetry   Afib w/ CVR 70s, occasional PVCs   EKG    Persistent Afib w/ CVR, incomplete LBBB  Labs   Basic Metabolic Panel: Recent Labs  Lab 08/27/20 1710 08/28/20 0455 08/29/20 0410  NA 138 139 139  K 4.7 4.8 4.0  CL 98 97* 98  CO2 31 30 33*  GLUCOSE 223* 168* 151*  BUN 42* 40* 38*  CREATININE 2.06* 1.88* 1.93*  CALCIUM 9.3 9.4 9.3  MG  --  2.4 2.2  PHOS  --   --  4.3    Liver Function Tests: No results for input(s): AST, ALT, ALKPHOS, BILITOT, PROT, ALBUMIN  in the last  168 hours. No results for input(s): LIPASE, AMYLASE in the last 168 hours. No results for input(s): AMMONIA in the last 168 hours.  CBC: Recent Labs  Lab 08/27/20 1710 08/29/20 0410  WBC 4.6 4.0  NEUTROABS  --  2.5  HGB 8.9* 8.3*  HCT 29.4* 27.0*  MCV 86.2 84.4  PLT 87* 84*    Cardiac Enzymes: No results for input(s): CKTOTAL, CKMB, CKMBINDEX, TROPONINI in the last 168 hours.  BNP: BNP (last 3 results) Recent Labs    06/27/20 0141 07/19/20 1752 08/28/20 0455  BNP 723.7* 620.4* 673.5*    ProBNP (last 3 results) No results for input(s): PROBNP in the last 8760 hours.   CBG: Recent Labs  Lab 08/28/20 1755 08/28/20 2114 08/29/20 0602 08/29/20 1144 08/29/20 1632  GLUCAP 107* 140* 139* 140* 171*    Coagulation Studies: No results for input(s): LABPROT, INR in the last 72 hours.   Imaging   No results found.   Medications:     Current Medications: . allopurinol  100 mg Oral BID  . aspirin EC  81 mg Oral Daily  . carvedilol  12.5 mg Oral BID WC  . enoxaparin (LOVENOX) injection  40 mg Subcutaneous Q24H  . insulin aspart  0-15 Units Subcutaneous TID WC  . pantoprazole  40 mg Oral Daily  . polyethylene glycol  17 g Oral BID    Infusions: . sodium chloride 1,000 mL (08/29/20 1702)  . furosemide (LASIX) infusion 10 mg/hr (08/29/20 1814)     Assessment/Plan   1. Acute on Chronic Combined Systolic and Diastolic CHF: - Multiple readmissions for acute CHF in the last 3 months w/ NYHA Class IIIb symptoms - Echo 8/21 EF 35-40%, mod HK, G2DD, RV normal - Echo 9/21 EF 40-45%, moderately elevated pulmonary artery systolic pressure,  21.3 mmHg. RV ok.  - multiple risk factors for CAD, but would avoid LHC in the setting of Stage III CKD and lack of ischemic CP - ? Viral CM s/p COVID 19 infection 1/21 -  also ? Cardiac amyloid (recurrent pleural effusions, conduction disease, low voltage QRS and chronic anemia ? Multiple myeloma,)  -  plan cMRI  to r/o  myocarditis and infiltrative CM  - check Multiple Myeloma panel + urine immunofixation  - doubt tachy mediated CM. She has had persistent Afib for several months, but EKGs have shown rate controlled Afib.  - needs outpatient sleep study to r/o OSA (history of snoring + obesity)  - She has evidence of significant fluid overloaded on exam. JVD elevated to ear. Will avoid PICC w/ SCr close to 2.0.  - Will need RHC later this week after diuresis  - Start Lasix gtt 10 mg/hr for diuresis. Follow BMP - reassess response in am, can add metolazone vs Diamox tomorrow as adjunct if needed (CO2 33)  - no ARB/ARNi, spiro nor dig w/ AKI on CKD - ok to continue low dose coreg, 12.5 bid  - can add Bidil if BP can tolerate - would also benefit from a SGLT2i w/ concomitant Stage III CKD and T2DM (GFR 31, Hgb A1c 7.5) - with multiple readmits for CHF in the last 6 months, consider CardioMEMS implant    2. AKI on CKD - Baseline SCr ~1.7 - 2.06 on admit>>1.88>>1.93 - clinically does not look like low output HF on exam but, suspect component of cardiorenal syndrome. She will ultimately need RHC  - continue to diurese per above  3. Chronic Anemia - Hgb 8.3  c/w baseline - prior h/o GIB - also w/ thrombocytopenia, plt 84K.  - plan w/u to r/o Multiple Myeloma as noted above   4. Persistent Atrial Fibrillation  - rate controlled in the 70s - not on a/c given chronic anemia and h/o GIB - not a candidate for DCCV attempt given inability to tolerate a/c - can consider Watchman device in the future, although this would require short-term treatment w/ coumadin  5. HTN - controlled on current regimen   6. Type 2DM - controlled, Hgb A1c 7.5  - consider future SGLT2i, GFR 31  7. H/o Provoked DVT/PE - occurrence following orthopedic surgery  - no longer on a/c - s/p IVC Filter   8. H/o COVID 19 Infection  - hospitalized 1/21 w/ COVID PNA. No intubation  - completed [employer] vaccination 4/21  9.  Chronic Hypoxic Respiratory Failure/ Pulmonary Hypertension  - on Chronic home O2, 2L/min baseline  - Echo shows moderately elevated RVSP 51 mmHg. RV function normal.  - likely multifactorial from CHF, obesity/ deconditioning, OSA/OHS   - suspect combination WHO Groups 2 + 3 and possibly 4 - continue diuresis  - Will need RHC - h/o PE. Consider V/Q scan to r/o CTEPH  - needs outpatient sleep study and PFTs   10. H/o Breast Cancer - treated w/ surgery, chemo + radiation   11. Obesity  Body mass index is 49.16 kg/m.  12. Snoring - suspect underlying OSA - will need home sleep study   Length of Stay: 1  Brittainy Simmons, PA-C  08/29/2020, 4:16 PM  Advanced Heart Failure Team Pager [phone] (M-F; 7a - 4p)  Please contact [facility] for night-coverage after hours (4p -7a ) and weekends on [email or web address]  Patient seen with PA, agree with the above note.   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  She wears home oxygen, suspect OHS/OSA.   She has had recurrent admissions for CHF.  She takes torsemide 20 mg at home. After last admission, she says that she tried to cut back on Na and fluid intake, but she began to swell again and became progressively short of breath. She was readmitted with acute/chronic systolic CHF.  Creatinine today is 1.93, which is near her baseline.  She is in chronic AF, rate 60s.  All recent ECGs show atrial fibrillation.  CXR with right effusion and vascular congestion.    General: NAD Neck: JVP 16 cm, no thyromegaly or thyroid nodule.  Lungs: Decreased BS right base.  CV: Nondisplaced PMI.  Heart irregular S1/S2, no S3/S4, no murmur.  2+ chronic edema to knees.  No carotid bruit.  Unable to palpate pedal pulses.  Abdomen: Soft, nontender, no hepatosplenomegaly, no distention.  Skin: Intact without lesions or rashes.  Neurologic: Alert and  oriented x 3.  Psych: Normal affect. Extremities: No clubbing or cyanosis.  HEENT: Normal.   Assessment/Plan: 1. Acute on chronic systolic CHF: I reviewed 6/81 echo, EF 40-45%, global hypokinesis, D-shaped IV septum suggestive of RV pressure/volume overload, RV poorly visualized but appeared normal in size with at least mild systolic dysfunction.  Cause of cardiomyopathy is uncertain.  She has history of COVID-19 1/21, cannot rule out viral myocarditis.  Chemotherapy-mediated CMP is possible (not sure what chemo she had remotely for breast cancer).  Cardiac amyloidosis remains a consideration with LVH, atrial fibrillation, and CKD.  We have not ruled out CAD.  She has  had recurrent HF admissions this year.  On exam, she is markedly volume overloaded, I suspect RV failure plays a significant role.  - Agree with Lasix gtt 10 mg/hr and follow response.  - Continue Coreg 12.5 mg bid.  - No ARNI/spironolactone for now with elevated creatinine.  - I will arrange for cardiac MRI to look for infiltrative disease, myocarditis.   - Myeloma panel, urine immunofixation.  - Consider RHC/LHC this admission if creatinine remains stable.  Will need to minimize contrast.  - She is a Cardiomems candidate.  2. Chronic hypoxemic respiratory failure: Suspect OHS/OSA.  She was using home oxygen prior to COVID-19 PNA.  - Continue supplemental oxygen.   - Will need sleep study as outpatient.  3. Atrial fibrillation: Chronic, all ECGs since 7/21 show atrial fibrillation.  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 Rate control is reasonable.  - She cannot be cardioverted at this point as she cannot be chronically anticoagulated.  - Consider Watchman device placement, though would need 45 days warfarin and Plavix x 6 months after that.  4. GI bleeding: Recurrent, from small bowel AVMs.  Hgb 8.3 today.  No overt bleeding.  5. Right pleural effusion: Consider thoracentesis.  6. H/o  DVT/PE: Provoked (post-surgery).  Has IVC filter.  7. CKD: Stage 3.  Follow creatinine closely with diuresis.   Loralie Champagne 08/29/2020 8:29 PM

## 2020-08-29 NOTE — Evaluation (Signed)
Physical Therapy Evaluation Patient Details Name: Rita [name] MRN: [id number] DOB: 1956-02-02 Today's Date: 08/29/2020   History of Present Illness  64 yo female with LE edema and SOB was noted to have CHF, presenting stating she feels like she has fluid overload.  PMHx: CHF last EF 35-40%, chronic respiratory failure on 2 L of oxygen via nasal cannula at baseline atrial fibrillation not on anticoagulation, CKD, obesity, HTN, PE, DM2, covid-19 (1/21), GI bleed 2/2 AVMs, pulmonary HTN, persistent Afib, and diastolic HF on supplmental O2 prn  Clinical Impression  Pt fully participated in session; pt limited by overall fatigue; pt requiring min A for functional mobility tasks and use of RW; pt states she moved more and better than she currently is; pt will benefit from skilled PT intervention to address deficits in balance, strength, coordination, gait, endurance and safety to maximize independence with functional mobility prior to discharge.     Follow Up Recommendations Home health PT    Equipment Recommendations  None recommended by PT    Recommendations for Other Services       Precautions / Restrictions Precautions Precautions: Fall Restrictions Weight Bearing Restrictions: No      Mobility  Bed Mobility Overal bed mobility: Needs Assistance Bed Mobility: Sidelying to Sit   Sidelying to sit: Min assist       General bed mobility comments: NT informed therapist pt needed min A from her, use of bedrails and HOB elevated  Transfers Overall transfer level: Needs assistance Equipment used: Rolling walker (2 wheeled) Transfers: Sit to/from Stand Sit to Stand: Min assist            Ambulation/Gait Ambulation/Gait assistance: Herbalist (Feet): 20 Feet Assistive device: Rolling walker (2 wheeled)       General Gait Details: increased weight bearing through B UEs; decreased velocity; forward flexed posture; wide BOS; decreased step length  B  Stairs            Wheelchair Mobility    Modified Rankin (Stroke Patients Only)       Balance Overall balance assessment: Needs assistance Sitting-balance support: Feet supported Sitting balance-Leahy Scale: Fair     Standing balance support: Bilateral upper extremity supported Standing balance-Leahy Scale: Poor Standing balance comment: pt with increased weight bearing through RW during standing and ambulation                             Pertinent Vitals/Pain Pain Assessment: No/denies pain    Home Living Family/patient expects to be discharged to:: Private residence Living Arrangements: Spouse/significant other Available Help at Discharge: Family;Available 24 hours/day Type of Home: House Home Access: Stairs to enter Entrance Stairs-Rails: Right Entrance Stairs-Number of Steps: 2 front 2 back Home Layout: One level Home Equipment: Cane - single point;Grab bars - tub/shower;Walker - 4 wheels Additional Comments: on O2 at home; pt states she doesn't use the tub/shower b/c of the height, she takes sponge baths    Prior Function Level of Independence: Independent with assistive device(s)         Comments: cane or Rollator     Hand Dominance   Dominant Hand: Right    Extremity/Trunk Assessment   Upper Extremity Assessment Upper Extremity Assessment: Defer to OT evaluation    Lower Extremity Assessment Lower Extremity Assessment: Generalized weakness (pt states she feels weaker in her legs)    Cervical / Trunk Assessment Cervical / Trunk Assessment: Kyphotic  Communication  Communication: No difficulties  Cognition Arousal/Alertness: Awake/alert Behavior During Therapy: WFL for tasks assessed/performed Overall Cognitive Status: Within Functional Limits for tasks assessed                                        General Comments General comments (skin integrity, edema, etc.): pt easily fatigued during session; pt  state R knee hurts due to arthritis; increased LE edema noted per pt compared to normal    Exercises General Exercises - Lower Extremity Long Arc Quad: AROM;Both;10 reps;Seated Hip Flexion/Marching: AROM;Both;10 reps;Seated   Assessment/Plan    PT Assessment Patient needs continued PT services  PT Problem List Decreased strength;Decreased mobility;Decreased activity tolerance;Decreased balance       PT Treatment Interventions Therapeutic exercise    PT Goals (Current goals can be found in the Care Plan section)  Acute Rehab PT Goals Patient Stated Goal: "to move better" PT Goal Formulation: With patient Time For Goal Achievement: 09/12/20 Potential to Achieve Goals: Fair    Frequency Min 3X/week   Barriers to discharge        Co-evaluation               AM-PAC PT "6 Clicks" Mobility  Outcome Measure Help needed turning from your back to your side while in a flat bed without using bedrails?: A Little Help needed moving from lying on your back to sitting on the side of a flat bed without using bedrails?: A Little Help needed moving to and from a bed to a chair (including a wheelchair)?: A Little Help needed standing up from a chair using your arms (e.g., wheelchair or bedside chair)?: A Little Help needed to walk in hospital room?: A Little Help needed climbing 3-5 steps with a railing? : A Little 6 Click Score: 18    End of Session Equipment Utilized During Treatment: Gait belt;Oxygen Activity Tolerance: Patient limited by fatigue Patient left: in bed;with call bell/phone within reach;with bed alarm set Nurse Communication: Mobility status PT Visit Diagnosis: Unsteadiness on feet (R26.81);Muscle weakness (generalized) (M62.81)    Time: 7793-9030 PT Time Calculation (min) (ACUTE ONLY): 28 min   Charges:   PT Evaluation $PT Eval Moderate Complexity: 1 Mod PT Treatments $Therapeutic Activity: 8-22 mins        Lyanne Co, DPT Acute Rehabilitation  Services [phone]  [place] 08/29/2020, 12:08 PM

## 2020-08-30 ENCOUNTER — Other Ambulatory Visit: Payer: Self-pay

## 2020-08-30 ENCOUNTER — Encounter (HOSPITAL_COMMUNITY): Payer: Self-pay | Admitting: Internal Medicine

## 2020-08-30 ENCOUNTER — Inpatient Hospital Stay: Payer: Self-pay

## 2020-08-30 ENCOUNTER — Inpatient Hospital Stay (HOSPITAL_COMMUNITY): Payer: PPO

## 2020-08-30 DIAGNOSIS — I50810 Right heart failure, unspecified: Secondary | ICD-10-CM

## 2020-08-30 DIAGNOSIS — K5900 Constipation, unspecified: Secondary | ICD-10-CM

## 2020-08-30 DIAGNOSIS — R110 Nausea: Secondary | ICD-10-CM

## 2020-08-30 LAB — BASIC METABOLIC PANEL
Anion gap: 5 (ref 5–15)
BUN: 41 mg/dL — ABNORMAL HIGH (ref 8–23)
CO2: 33 mmol/L — ABNORMAL HIGH (ref 22–32)
Calcium: 9.1 mg/dL (ref 8.9–10.3)
Chloride: 100 mmol/L (ref 98–111)
Creatinine, Ser: 2.11 mg/dL — ABNORMAL HIGH (ref 0.44–1.00)
GFR calc Af Amer: 28 mL/min — ABNORMAL LOW (ref >60)
GFR calc non Af Amer: 24 mL/min — ABNORMAL LOW (ref >60)
Glucose, Bld: 168 mg/dL — ABNORMAL HIGH (ref 70–99)
Potassium: 3.7 mmol/L (ref 3.5–5.1)
Sodium: 138 mmol/L (ref 135–145)

## 2020-08-30 LAB — MULTIPLE MYELOMA PANEL, SERUM
Albumin SerPl Elph-Mcnc: 3.9 g/dL (ref 2.9–4.4)
Albumin/Glob SerPl: 0.9 (ref 0.7–1.7)
Alpha 1: 0.3 g/dL (ref 0.0–0.4)
Alpha2 Glob SerPl Elph-Mcnc: 0.4 g/dL (ref 0.4–1.0)
B-Globulin SerPl Elph-Mcnc: 1.5 g/dL — ABNORMAL HIGH (ref 0.7–1.3)
Gamma Glob SerPl Elph-Mcnc: 2.3 g/dL — ABNORMAL HIGH (ref 0.4–1.8)
Globulin, Total: 4.5 g/dL — ABNORMAL HIGH (ref 2.2–3.9)
IgA: 584 mg/dL — ABNORMAL HIGH (ref 87–352)
IgG (Immunoglobin G), Serum: 2415 mg/dL — ABNORMAL HIGH (ref 586–1602)
IgM (Immunoglobulin M), Srm: 109 mg/dL (ref 26–217)
Total Protein ELP: 8.4 g/dL (ref 6.0–8.5)

## 2020-08-30 LAB — GLUCOSE, CAPILLARY
Glucose-Capillary: 137 mg/dL — ABNORMAL HIGH (ref 70–99)
Glucose-Capillary: 146 mg/dL — ABNORMAL HIGH (ref 70–99)
Glucose-Capillary: 190 mg/dL — ABNORMAL HIGH (ref 70–99)
Glucose-Capillary: 219 mg/dL — ABNORMAL HIGH (ref 70–99)

## 2020-08-30 LAB — COOXEMETRY PANEL
Carboxyhemoglobin: 1.5 % (ref 0.5–1.5)
Methemoglobin: 0.7 % (ref 0.0–1.5)
O2 Saturation: 57.7 %
Total hemoglobin: 9.3 g/dL — ABNORMAL LOW (ref 12.0–16.0)

## 2020-08-30 LAB — PATHOLOGIST SMEAR REVIEW

## 2020-08-30 MED ORDER — GADOBUTROL 1 MMOL/ML IV SOLN
10.0000 mL | Freq: Once | INTRAVENOUS | Status: AC | PRN
  Administered 2020-08-30: 10 mL via INTRAVENOUS

## 2020-08-30 MED ORDER — SENNA 8.6 MG PO TABS
1.0000 | ORAL_TABLET | Freq: Two times a day (BID) | ORAL | Status: DC
  Administered 2020-08-30 – 2020-08-31 (×3): 8.6 mg via ORAL
  Filled 2020-08-30 (×3): qty 1

## 2020-08-30 MED ORDER — SODIUM CHLORIDE 0.9% FLUSH
10.0000 mL | Freq: Two times a day (BID) | INTRAVENOUS | Status: DC
  Administered 2020-08-30: 20 mL
  Administered 2020-08-30 – 2020-09-08 (×6): 10 mL

## 2020-08-30 MED ORDER — METOLAZONE 2.5 MG PO TABS
2.5000 mg | ORAL_TABLET | Freq: Once | ORAL | Status: AC
  Administered 2020-08-30: 2.5 mg via ORAL
  Filled 2020-08-30: qty 1

## 2020-08-30 MED ORDER — CHLORHEXIDINE GLUCONATE CLOTH 2 % EX PADS
6.0000 | MEDICATED_PAD | Freq: Every day | CUTANEOUS | Status: DC
  Administered 2020-08-31 – 2020-09-08 (×8): 6 via TOPICAL

## 2020-08-30 MED ORDER — DAPAGLIFLOZIN PROPANEDIOL 10 MG PO TABS
10.0000 mg | ORAL_TABLET | Freq: Every day | ORAL | Status: DC
  Filled 2020-08-30: qty 1

## 2020-08-30 MED ORDER — SODIUM CHLORIDE 0.9% FLUSH: 10.0000 mL | INTRAVENOUS | Status: DC | PRN

## 2020-08-30 MED ORDER — MILRINONE LACTATE IN DEXTROSE 20-5 MG/100ML-% IV SOLN
0.2500 ug/kg/min | INTRAVENOUS | Status: DC
  Administered 2020-08-30 – 2020-09-05 (×13): 0.25 ug/kg/min via INTRAVENOUS
  Filled 2020-08-30 (×13): qty 100

## 2020-08-30 MED ORDER — POTASSIUM CHLORIDE CRYS ER 20 MEQ PO TBCR
40.0000 meq | EXTENDED_RELEASE_TABLET | Freq: Once | ORAL | Status: AC
  Administered 2020-08-30: 40 meq via ORAL
  Filled 2020-08-30: qty 2

## 2020-08-30 NOTE — Progress Notes (Addendum)
Cinthya from phlebotomy was unable to draw blood for labs. Nurse present during attempts.  Pt transported to MRI.  Currently awaiting for pt to come back from MRI to call phlebotomist again upon pt's arrival to unit.  PA made aware by phone call.

## 2020-08-30 NOTE — Progress Notes (Signed)
Interval Progress Note   PICC line placed.   Co-ox marginal at 58% CVP 29  BMP shows bump in SCr from 1.9>>2.1. K 3.7  Start milrinone 0.25 mcg/kg/min  Continue lasix gtt at 12 mg/hr  Give extra KCl, 40 mEq x 1   Has chronic afib at baseline. HR currently in the 60s. Monitor closely on tele w/ milrinone.   cMRI results pending.   F/u BMP and Co-ox in AM CVPs ordered q4hr  Pt resting comfortably in bed. Husband present at bedside and updated on plan.   Lyda Jester, PA-C 08/30/2020

## 2020-08-30 NOTE — Progress Notes (Signed)
Patient ID: Rita [name], female   DOB: July 25, 1956, 64 y.o.   MRN: [id number]     Advanced Heart Failure Rounding Note  PCP-Cardiologist: No primary care provider on file.   Subjective:    Not much urine output recorded, having trouble with the Purewick.  BP stable, remains in rate-controlled atrial fibrillation.  No labs yet this morning.    Objective:   Weight Range: 117.8 kg Body mass index is 49.09 kg/m.   Vital Signs:   Temp:  [97.8 F (36.6 C)-98.5 F (36.9 C)] 98.1 F (36.7 C) (09/22 0335) Pulse Rate:  [52-78] 56 (09/22 0335) Resp:  [15-18] 18 (09/22 0335) BP: (104-122)/(57-65) 115/63 (09/22 0335) SpO2:  [93 %-100 %] 97 % (09/22 0335) Weight:  [117.8 kg] 117.8 kg (09/22 0335) Last BM Date: 08/27/20  Weight change: Filed Weights   08/27/20 1702 08/29/20 0326 08/30/20 0335  Weight: 117.9 kg 118 kg 117.8 kg    Intake/Output:   Intake/Output Summary (Last 24 hours) at 08/30/2020 0855 Last data filed at 08/30/2020 0600 Gross per 24 hour  Intake 358.39 ml  Output 550 ml  Net -191.61 ml      Physical Exam    General:  Well appearing. No resp difficulty HEENT: Normal Neck: Supple. JVP 16+ cm. Carotids 2+ bilat; no bruits. No lymphadenopathy or thyromegaly appreciated. Cor: PMI nondisplaced. Irregular rate & rhythm. No rubs, gallops. 2/6 SEM RUSB. Lungs: Decreased at bases.  Abdomen: Soft, nontender, nondistended. No hepatosplenomegaly. No bruits or masses. Good bowel sounds. Extremities: No cyanosis, clubbing, rash. 2+ chronic edema to knees.  Neuro: Alert & orientedx3, cranial nerves grossly intact. moves all 4 extremities w/o difficulty. Affect pleasant   Telemetry   Atrial fibrillation in 70s (personally reviewed)  Labs    CBC Recent Labs    08/27/20 1710 08/29/20 0410  WBC 4.6 4.0  NEUTROABS  --  2.5  HGB 8.9* 8.3*  HCT 29.4* 27.0*  MCV 86.2 84.4  PLT 87* 84*   Basic Metabolic Panel Recent Labs    08/28/20 0455 08/29/20 0410   NA 139 139  K 4.8 4.0  CL 97* 98  CO2 30 33*  GLUCOSE 168* 151*  BUN 40* 38*  CREATININE 1.88* 1.93*  CALCIUM 9.4 9.3  MG 2.4 2.2  PHOS  --  4.3   Liver Function Tests No results for input(s): AST, ALT, ALKPHOS, BILITOT, PROT, ALBUMIN in the last 72 hours. No results for input(s): LIPASE, AMYLASE in the last 72 hours. Cardiac Enzymes No results for input(s): CKTOTAL, CKMB, CKMBINDEX, TROPONINI in the last 72 hours.  BNP: BNP (last 3 results) Recent Labs    06/27/20 0141 07/19/20 1752 08/28/20 0455  BNP 723.7* 620.4* 673.5*    ProBNP (last 3 results) No results for input(s): PROBNP in the last 8760 hours.   D-Dimer No results for input(s): DDIMER in the last 72 hours. Hemoglobin A1C Recent Labs    08/28/20 0455  HGBA1C 7.5*   Fasting Lipid Panel No results for input(s): CHOL, HDL, LDLCALC, TRIG, CHOLHDL, LDLDIRECT in the last 72 hours. Thyroid Function Tests No results for input(s): TSH, T4TOTAL, T3FREE, THYROIDAB in the last 72 hours.  Invalid input(s): FREET3  Other results:   Imaging     No results found.   Medications:     Scheduled Medications: . allopurinol  100 mg Oral BID  . aspirin EC  81 mg Oral Daily  . carvedilol  12.5 mg Oral BID WC  . enoxaparin (LOVENOX) injection  40 mg Subcutaneous Q24H  . insulin aspart  0-15 Units Subcutaneous TID WC  . pantoprazole  40 mg Oral Daily     Infusions: . sodium chloride Stopped (08/29/20 1823)  . furosemide (LASIX) infusion 10 mg/hr (08/30/20 0600)     PRN Medications:  sodium chloride, acetaminophen, ondansetron (ZOFRAN) IV, [COMPLETED] polyethylene glycol **FOLLOWED BY** polyethylene glycol   Assessment/Plan   1. Acute on chronic systolic CHF: I reviewed 8/67 echo, EF 40-45%, global hypokinesis, D-shaped IV septum suggestive of RV pressure/volume overload, RV poorly visualized but appeared normal in size with at least mild systolic dysfunction. Cause of cardiomyopathy is uncertain.   She has history of COVID-19 1/21, cannot rule out viral myocarditis. Chemotherapy-mediated CMP is possible (not sure what chemo she had remotely for breast cancer).  Cardiac amyloidosis remains a consideration with LVH, atrial fibrillation, and CKD.  We have not ruled out CAD.  She has had recurrent HF admissions this year.  On exam, she is markedly volume overloaded, I suspect RV failure plays a significant role. So far not diuresing particularly well on Lasix gtt.  Awaiting labs this morning (ordered but never drawn).  - BMET needs to be sent stat.  - I will place PICC today to assess filling pressures and cardiac output, may benefit from milrinone for RV support while diuresing.  - Increase Lasix gtt to 12 mg/hr and will give metolazone 2.5 x 1.   - Continue Coreg 12.5 mg bid.  - No ARNI/spironolactone for now with elevated creatinine.  - I will arrange for cardiac MRI to look for infiltrative disease, myocarditis.   - Myeloma panel, urine immunofixation.  - Consider RHC/LHC this admission if creatinine remains stable.  Will need to minimize contrast.  - She is a Cardiomems candidate.  2. Chronic hypoxemic respiratory failure: Suspect OHS/OSA.  She was using home oxygen prior to COVID-19 PNA.  - Continue supplemental oxygen.   - Will need sleep study as outpatient.  3. Atrial fibrillation: Chronic, all ECGs since 7/21 show atrial fibrillation.  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 Rate control is reasonable.  - She cannot be cardioverted at this point as she cannot be chronically anticoagulated.  - Consider Watchman device placement, though would need 45 days warfarin and Plavix x 6 months after that.  4. GI bleeding: Recurrent, from small bowel AVMs. No overt bleeding. CBC not drawn yet today.  5. Right pleural effusion: Consider thoracentesis.  6. 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  7. CKD: Stage 3.  Follow creatinine closely  with diuresis.   Length of Stay: 2  Loralie Champagne, MD  08/30/2020, 8:55 AM  Advanced Heart Failure Team Pager [phone] (M-F; 7a - 4p)  Please contact [facility] for night-coverage after hours (4p -7a ) and weekends on [email or web address]

## 2020-08-30 NOTE — Progress Notes (Signed)
Peripherally Inserted Central Catheter Placement  The IV Nurse has discussed with the patient and/or persons authorized to consent for the patient, the purpose of this procedure and the potential benefits and risks involved with this procedure.  The benefits include less needle sticks, lab draws from the catheter, and the patient may be discharged home with the catheter. Risks include, but not limited to, infection, bleeding, blood clot (thrombus formation), and puncture of an artery; nerve damage and irregular heartbeat and possibility to perform a PICC exchange if needed/ordered by physician.  Alternatives to this procedure were also discussed.  Bard Power PICC patient education guide, fact sheet on infection prevention and patient information card has been provided to patient /or left at bedside.    PICC Placement Documentation  PICC Double Lumen 08/30/20 PICC Right Brachial 37 cm 0 cm (Active)  Indication for Insertion or Continuance of Line Chronic illness with exacerbations (CF, Sickle Cell, etc.) 08/30/20 1400  Exposed Catheter (cm) 0 cm 08/30/20 1400  Site Assessment Clean;Dry;Intact 08/30/20 1400  Lumen #1 Status Flushed;Saline locked;Blood return noted 08/30/20 1400  Lumen #2 Status Flushed;Saline locked;Blood return noted 08/30/20 1400  Dressing Type Transparent;Securing device 08/30/20 1400  Dressing Status Clean;Dry;Intact 08/30/20 1400  Antimicrobial disc in place? Yes 08/30/20 1400  Line Care Connections checked and tightened 08/30/20 1400  Dressing Intervention New dressing 08/30/20 1400  Dressing Change Due 09/06/20 08/30/20 1400       Holley Bouche Renee 08/30/2020, 2:29 PM

## 2020-08-30 NOTE — Progress Notes (Signed)
Phlebotomist at bedside.

## 2020-08-30 NOTE — Evaluation (Signed)
Occupational Therapy Evaluation Patient Details Name: Rita [name] MRN: [id number] DOB: 12/27/1955 Today's Date: 08/30/2020    History of Present Illness 64 yo female with LE edema and SOB was noted to have CHF.  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, pulmonary HTN, persistent Afib, and diastolic HF on supplmental O2 prn   Clinical Impression   Patient was admitted with the above diagnosis.  She presents with dyspnea with exertion, and mild unsteadiness with mobility.  She started using a 4WRW recently, and appears to be at or near baseline for ADL completion.  She does require assist at times for LB ADL, OT discussed hip kit, and spouse generally cares for meals, home management and community mobility.  She does take care of her meds.  No further OT needs in the acute setting, however, HH OT could assist with continued DME needs at home, and basic carryover of UB HEP.  All questions answered.  Patient being transported to MRI at the conclusion of eval.  2 L O2 via [place].      Follow Up Recommendations  Home health OT    Equipment Recommendations  Other (comment) (Hip Kit for LB dressing.)    Recommendations for Other Services       Precautions / Restrictions Precautions Precautions: Fall Restrictions Weight Bearing Restrictions: No      Mobility Bed Mobility                  Transfers Overall transfer level: Needs assistance   Transfers: Sit to/from Stand Sit to Stand: Min guard         General transfer comment: Min Guard for stand pivot transfer to transport chair.    Balance Overall balance assessment: Needs assistance Sitting-balance support: Feet supported Sitting balance-Leahy Scale: Good     Standing balance support: Bilateral upper extremity supported Standing balance-Leahy Scale: Fair Standing balance comment: increased SOB noted.  Reaching for objects in her environment.                           ADL either  performed or assessed with clinical judgement   ADL Overall ADL's : At baseline                                       General ADL Comments: Patient needing assist with LB ADL, increased time for UB ADL due to DOE.  Patient sitting EOB, demo's good safety.  Good use of therapeutic rest breaks.  Chronic O2     Vision Baseline Vision/History: No visual deficits Patient Visual Report: No change from baseline       Perception     Praxis      Pertinent Vitals/Pain Pain Assessment: No/denies pain     Hand Dominance Right   Extremity/Trunk Assessment Upper Extremity Assessment Upper Extremity Assessment: Overall WFL for tasks assessed   Lower Extremity Assessment Lower Extremity Assessment: Defer to PT evaluation       Communication Communication Communication: No difficulties   Cognition Arousal/Alertness: Awake/alert   Overall Cognitive Status: Within Functional Limits for tasks assessed  Home Living Family/patient expects to be discharged to:: Private residence Living Arrangements: Spouse/significant other Available Help at Discharge: Family;Available 24 hours/day Type of Home: House Home Access: Stairs to enter [employer] of Steps: 2 front 2 back Entrance Stairs-Rails: Right Home Layout: One level     Bathroom Shower/Tub: Teacher, early years/pre: Handicapped height     Home Equipment: Cane - single point;Grab bars - tub/shower;Walker - 4 wheels          Prior Functioning/Environment Level of Independence: Needs assistance  Gait / Transfers Assistance Needed: Patient recently transitioned from [facility] to 4WRW ADL's / Homemaking Assistance Needed: Spouse assist as needed for LB ADL depending on how she is feeling that day.  Spouse completes home management and meal prep.  Patient able to care ofr her meds. Communication / Swallowing Assistance  Needed: none          OT Problem List: Decreased activity tolerance;Decreased strength      OT Treatment/Interventions:      OT Goals(Current goals can be found in the care plan section) Acute Rehab OT Goals Patient Stated Goal: Get back home. OT Goal Formulation: With patient Time For Goal Achievement: 09/01/20 Potential to Achieve Goals: Good  OT Frequency:     Barriers to D/C:  medical status          Co-evaluation              AM-PAC OT "6 Clicks" Daily Activity     Outcome Measure Help from another person eating meals?: None Help from another person taking care of personal grooming?: None Help from another person toileting, which includes using toliet, bedpan, or urinal?: A Little Help from another person bathing (including washing, rinsing, drying)?: A Lot Help from another person to put on and taking off regular upper body clothing?: A Little Help from another person to put on and taking off regular lower body clothing?: A Lot 6 Click Score: 18   End of Session Equipment Utilized During Treatment: Gait belt;Rolling walker  Activity Tolerance: Patient limited by fatigue Patient left: in chair;Other (comment) (heading to MRI with transport.)  OT Visit Diagnosis: Muscle weakness (generalized) (M62.81);Unsteadiness on feet (R26.81)                Time: 8206-0156 OT Time Calculation (min): 19 min Charges:  OT General Charges $OT Visit: 1 Visit OT Evaluation $OT Eval Moderate Complexity: 1 Mod  08/30/2020  Rita [name], OTR/L  Acute Rehabilitation Services  Office:  [phone]   Rita [name] 08/30/2020, 10:38 AM

## 2020-08-30 NOTE — TOC Initial Note (Addendum)
Transition of Care [facility]) - Initial/Assessment Note    Patient Details  Name: Rita [name] MRN: [id number] Date of Birth: 1956/05/27  [facility] of [facility]) CM/SW Contact:    Zenon Mayo, RN Phone Number: 08/30/2020, 9:20 AM  Clinical Narrative:                 Patient from home with spouse, she has home oxygen 2 liters, she states her spouse will bring tank for her when she gets ready to go home.  She states she had [place] services with  [facility] previously she is ok with them again if possible if not she does not have a preference.  NCM contacted [place] with [facility] she states [place] area is on hold for PT and Nursing right now so can not take referral .  NCM made referral to [place] with [facility]. Awaiting to hear back to see if she can take referral for [facility] and HHPT.  Helene Kelp with [facility] states they can take patient for [facility], HHPT.  Soc will begin on Saturday or Sunday.   Expected Discharge Plan: [place] Barriers to Discharge: Continued Medical Work up   Patient Goals and CMS Choice Patient states their goals for this hospitalization and ongoing recovery are:: get better CMS Medicare.gov Compare Post Acute Care list provided to:: Patient Choice offered to / list presented to : Patient  Expected Discharge Plan and Services Expected Discharge Plan: [place]   Discharge Planning Services: CM Consult Post Acute Care Choice: [place] arrangements for the past 2 months: Single Family Home                   DME Agency: NA                  Prior Living Arrangements/Services Living arrangements for the past 2 months: Single Family Home Lives with:: Spouse Patient language and need for interpreter reviewed:: Yes Do you feel safe going back to the place where you live?: Yes      Need for Family Participation in Patient Care: Yes (Comment) Care giver support system in place?: Yes (comment)   Criminal  Activity/Legal Involvement Pertinent to Current Situation/Hospitalization: No - Comment as needed  Activities of Daily Living Home Assistive Devices/Equipment: Walker (specify type) ADL Screening (condition at time of admission) Patient's cognitive ability adequate to safely complete daily activities?: Yes Is the patient deaf or have difficulty hearing?: No Does the patient have difficulty seeing, even when wearing glasses/contacts?: No Does the patient have difficulty concentrating, remembering, or making decisions?: No Patient able to express need for assistance with ADLs?: Yes Does the patient have difficulty dressing or bathing?: No Independently performs ADLs?: Yes (appropriate for developmental age) Does the patient have difficulty walking or climbing stairs?: Yes Weakness of Legs: Both Weakness of Arms/Hands: None  Permission Sought/Granted                  Emotional Assessment Appearance:: Appears stated age Attitude/Demeanor/Rapport: Engaged Affect (typically observed): Appropriate Orientation: : Oriented to Self, Oriented to Place, Oriented to  Time, Oriented to Situation Alcohol / Substance Use: Not Applicable Psych Involvement: No (comment)  Admission diagnosis:  Acute systolic heart failure ([place]) [I50.21] Acute on chronic congestive heart failure, unspecified heart failure type [facility]) [I50.9] Patient Active Problem List   Diagnosis Date Noted  . Acute systolic heart failure ([place]) 08/28/2020  . Pulmonary hypertension ([place]) 08/28/2020  . Gout 08/02/2020  .  Constipation 08/02/2020  . Thrombocytopenia ([place]) 07/23/2020  . Anemia 07/23/2020  . Acute on chronic combined systolic and diastolic heart failure ([place]) 07/21/2020  . Acute exacerbation of congestive heart failure ([place]) 06/27/2020  . Goiter   . Morbid obesity ([place]) 06/19/2020  . Neck mass 06/18/2020  . Acute respiratory failure with hypoxia ([place]) 06/16/2020  . Diabetes mellitus without complication ([place])    . Persistent atrial fibrillation ([place])   . CKD (chronic kidney disease), stage III   . Hypertension associated with diabetes ([place])    PCP:  [name], MD Pharmacy:   [facility] [place], [address] AT [address] [place] [address] Phone: [phone] Fax: [phone]     Social Determinants of Health (SDOH) Interventions    Readmission Risk Interventions Readmission Risk Prevention Plan 08/30/2020 07/25/2020  Transportation Screening Complete Complete  PCP or Specialist Appt within 3-5 Days Complete Complete  HRI or Home Care Consult Complete Complete  Social Work Consult for [place] Planning/Counseling Complete Complete  Palliative Care Screening Not Applicable Not Applicable  Medication Review Press photographer) Complete Complete  Some recent data might be hidden

## 2020-08-30 NOTE — Progress Notes (Addendum)
Subjective:  Patient evaluated at bedside. IV team in room during interview, patient states that she is nervous around needles. States that she feels the same today as she did yesterday with no discernable changes in her breathing or edema. She feels that her urine output has remained low. There were some issues with her Purewick yesterday, but it seems to be working properly at this time.  We discussed our concern about an infiltrative process causing her heart disease and she is amenable to cardiac MRI today.  No chest pain, dizziness, or lightheadedness.   Objective:  Vital signs in last 24 hours: Vitals:   08/29/20 1636 08/29/20 1948 08/30/20 0020 08/30/20 0335  BP: (!) 110/59 104/65 (!) 111/57 115/63  Pulse: (!) 52 (!) 56 (!) 58 (!) 56  Resp: _0 Temp: 97.8 F (36.6 C) 98.1 F (36.7 C) 98 F (36.7 C) 98.1 F (36.7 C)  TempSrc: Oral Oral Oral Oral  SpO2: 98% 100% 93% 97%  Weight:    117.8 kg  Height:       Weight change: -0.181 kg  Intake/Output Summary (Last 24 hours) at 08/30/2020 1153 Last data filed at 08/30/2020 1020 Gross per 24 hour  Intake 598.39 ml  Output 750 ml  Net -151.61 ml   Physical Exam Vitals and nursing note reviewed.  Constitutional:      General: She is not in acute distress.    Interventions: Nasal cannula in place.     Comments: 2L nasal cannula  Cardiovascular:     Rate and Rhythm: Rhythm irregularly irregular.     Heart sounds: Normal heart sounds. No murmur heard.      Comments: No murmur heard today, a change from previous exams. Pulmonary:     Breath sounds: Examination of the right-lower field reveals decreased breath sounds. Examination of the left-lower field reveals decreased breath sounds. Decreased breath sounds present. No rales.  Neurological:     Mental Status: She is alert.       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 Patient was hospitalized 1 month ago with similar symptomsand home meds were switched from Lasix 25m daily to torsemide 215mdaily. Not diuresing well on Lasix 1087mour. Weight essentially unchanged.  BNP 673.5, similar to past admissions. Troponins 139 --> 148.  CXR with pulmonary venous congestion and bilateral pleural effusions. EKG with no new ischemic changes but low voltage. Echo with EF 40-45%, indeterminate diastolic function, moderately elevated pulmonary artery systolic pressure, no aortic stenosis, tricuspid regurgitation, and trivial mitral regurgitation.  Concerned for possible infiltrative process with myeloma labs and urine immunofixation pending.   - Advanced HF team follow, appreciate their recommendations - Cardiac MRI pending. - Increase Lasix to 12 mg/hr - Metolazone 2.5mg53m today - Continue Coreg 12.5 mg BID, will consider decreasing dose if bradycardia worsens - HF team to place PICC today - HF team to consider RHC/LHC later this week if creatinine stable - Continue  home O2 of 2L - KCl 20m PO daily - Daily weights - Strict I/Os - Written instructions for home diuretic titration upon discharge  Thrombocytopenia: Chronic Normocytic Anemia: Thrombocytopenia of unknown origin in a patient with a history of cancer, chemotherapy, and radiation. Some workup done at last admission: abdominal UKoreawithout splemomegaly, normal haptoglobin, and peripheral smear with normocytic anemia and  thrombocytopenia. Has ahistory of small bowel AVM's and reports dark stools 2/2 iron.Peripheral smear with thrombocytopenia and circulating NRBCs.  Less likely to be hemolytic in nature. In the setting of chronic anemia and persistently low-normal WBC, consider isolated thrombocytopenia vs. pancytopenia. Platelets 110 at discharge of last admission, 84 today.Hemoglobin8.9 on admission. Patient has history of normocytic anemia and takes iron supplements (BID). Iron, Ferritin, and TIBC all WNL. Reticulocyte percentage (1.3%) inappropriately low given her anemia. Increased immature retic fraction at 19.5%. Given these findings, she likely warrants a bone marrow biopsy after this hospitalization, but this appears chronic in nature and is something that can be worked up on an outpatient basis. If cardiac workup is positive for amyloidosis or other infiltrative process, consider that this could represent bone marrow involvement. Multiple myeloma panel pending - Outpatient heme/onc follow-up - Ferrous sulfate 3275monce daily - Myeloma/amyloidosis workup as above  Constipation: Last bowel movement four days ago. - Miralax to help with bowel movement - Added senna today  Chronic Atrial Fibrillation: EKG with Afib anda rate of 75 bpm. 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 - Continue ASA 64m41mily  - Telemetry - Not a candidate for cardioversion as she cannot be anticoagulated due to her history of GI bleeding 2/2 AVM  Acute on chronic CKD vs. Progression of CKD IIIb: Creatinine 2.06 --> 1.93 (baseline 1.59) - AM BMP not resulted yet  T2DM: Does not check blood sugar at home. Home meds are Metformin 500mg29mthe morning only and glipizide 5mg d47my. A1c 7.5. - Hold home meds for now - Consider adding empaglaflozin  to optimize cardioprotection  - SSI with moderate correction coverage - Monitor CBGs  HTN: BP well-controlled on home dose of carvedilol 12.5mg BI35mBradycardic but asymptomatic.  - Continue home carvedilol. Consider reducing dose if she remains bradycardic or becomes  - Consider adding ACEI/ARB/ARNI for cardioprotective effects, but hold off until her AKI resolves - Daily vitals  Osteoarthritis of the Knees: Chronic bilateral knee pain impacting patient mobility. Controlled at home with PRN Tylenol. Seen by PT/OT, who recommended home health PT/OT.  - Tylenol 650mg q419m - Home health PT/OT on discharge.   Nausea, Resolved  - Continue Zofran PRN    LOS: 2 days   Ilya Neely,Pearla Dubonnetl Student 08/30/2020, 11:53 AM

## 2020-08-30 NOTE — Progress Notes (Signed)
   08/30/20 1020  Output (mL)  Urine 200 mL

## 2020-08-30 NOTE — Progress Notes (Signed)
Per CCMD pt HR 39 nonsustained. Current HR 71.  afib baseline. Milrinone at 8.59ml/hr. pt denies chest pain, dizziness or SOB. NP paged to make aware. Pt's husband at bedside.  Call bell within reach.

## 2020-08-30 NOTE — Progress Notes (Signed)
RN spoke with iv team.  It team will be coming in 30 minutes to place PICC.

## 2020-08-30 NOTE — Progress Notes (Signed)
°   08/30/20 1600  Invasive Hemodynamic Monitoring  CVP (mmHg) 26 mmHg

## 2020-08-30 NOTE — TOC Transition Note (Signed)
Transition of Care [facility]) - CM/SW Discharge Note   Patient Details  Name: Rita [name] MRN: [id number] Date of Birth: 1956/06/27  Transition of Care [facility]) CM/SW Contact:  Zenon Mayo, RN Phone Number: 08/30/2020, 10:44 AM   Clinical Narrative:    Patient is set up with [facility] for [facility], [place], soc will begin on Saturday or Sunday.  Her spouse will transport her home and bring her oxygen tank per patient. TOC to continue to follow.    Final next level of care: [place] Barriers to Discharge: Continued Medical Work up   Patient Goals and CMS Choice Patient states their goals for this hospitalization and ongoing recovery are:: get better CMS Medicare.gov Compare Post Acute Care list provided to:: Patient Choice offered to / list presented to : Patient  Discharge Placement                       Discharge Plan and Services   Discharge Planning Services: CM Consult Post Acute Care Choice: Home Health            DME Agency: NA       HH Arranged: RN, Disease Management, PT [place] Agency: [facility] (formerly [employer]) Date [place]: 08/30/20 Time [place]: 1044 Representative spoke with at Portal: [place] ([place]) Interventions     Readmission Risk Interventions Readmission Risk Prevention Plan 08/30/2020 07/25/2020  Transportation Screening Complete Complete  PCP or Specialist Appt within 3-5 Days Complete Complete  HRI or [place] Complete Complete  Social Work Consult for [place] Planning/Counseling Complete Complete  Palliative Care Screening Not Applicable Not Applicable  Medication Review Press photographer) Complete Complete  Some recent data might be hidden

## 2020-08-31 ENCOUNTER — Inpatient Hospital Stay (HOSPITAL_COMMUNITY): Payer: PPO

## 2020-08-31 DIAGNOSIS — I50810 Right heart failure, unspecified: Secondary | ICD-10-CM

## 2020-08-31 DIAGNOSIS — I4819 Other persistent atrial fibrillation: Secondary | ICD-10-CM

## 2020-08-31 HISTORY — PX: IR THORACENTESIS ASP PLEURAL SPACE W/IMG GUIDE: IMG5380

## 2020-08-31 LAB — LACTATE DEHYDROGENASE: LDH: 190 U/L (ref 98–192)

## 2020-08-31 LAB — BASIC METABOLIC PANEL
Anion gap: 9 (ref 5–15)
Anion gap: 9 (ref 5–15)
BUN: 41 mg/dL — ABNORMAL HIGH (ref 8–23)
BUN: 41 mg/dL — ABNORMAL HIGH (ref 8–23)
CO2: 35 mmol/L — ABNORMAL HIGH (ref 22–32)
CO2: 35 mmol/L — ABNORMAL HIGH (ref 22–32)
Calcium: 9 mg/dL (ref 8.9–10.3)
Calcium: 9 mg/dL (ref 8.9–10.3)
Chloride: 96 mmol/L — ABNORMAL LOW (ref 98–111)
Chloride: 95 mmol/L — ABNORMAL LOW (ref 98–111)
Creatinine, Ser: 2.23 mg/dL — ABNORMAL HIGH (ref 0.44–1.00)
Creatinine, Ser: 2.26 mg/dL — ABNORMAL HIGH (ref 0.44–1.00)
GFR calc Af Amer: 26 mL/min — ABNORMAL LOW (ref >60)
GFR calc Af Amer: 25 mL/min — ABNORMAL LOW (ref >60)
GFR calc non Af Amer: 22 mL/min — ABNORMAL LOW (ref >60)
GFR calc non Af Amer: 22 mL/min — ABNORMAL LOW (ref >60)
Glucose, Bld: 255 mg/dL — ABNORMAL HIGH (ref 70–99)
Glucose, Bld: 174 mg/dL — ABNORMAL HIGH (ref 70–99)
Potassium: 3.9 mmol/L (ref 3.5–5.1)
Potassium: 3.9 mmol/L (ref 3.5–5.1)
Sodium: 140 mmol/L (ref 135–145)
Sodium: 139 mmol/L (ref 135–145)

## 2020-08-31 LAB — CBC
HCT: 25.2 % — ABNORMAL LOW (ref 36.0–46.0)
Hemoglobin: 7.6 g/dL — ABNORMAL LOW (ref 12.0–15.0)
MCH: 25.9 pg — ABNORMAL LOW (ref 26.0–34.0)
MCHC: 30.2 g/dL (ref 30.0–36.0)
MCV: 86 fL (ref 80.0–100.0)
Platelets: 88 10*3/uL — ABNORMAL LOW (ref 150–400)
RBC: 2.93 MIL/uL — ABNORMAL LOW (ref 3.87–5.11)
RDW: 18.6 % — ABNORMAL HIGH (ref 11.5–15.5)
WBC: 4 10*3/uL (ref 4.0–10.5)
nRBC: 0 % (ref 0.0–0.2)

## 2020-08-31 LAB — HEPATIC FUNCTION PANEL
ALT: 16 U/L (ref 0–44)
AST: 24 U/L (ref 15–41)
Albumin: 3.5 g/dL (ref 3.5–5.0)
Alkaline Phosphatase: 84 U/L (ref 38–126)
Bilirubin, Direct: 0.7 mg/dL — ABNORMAL HIGH (ref 0.0–0.2)
Indirect Bilirubin: 1.4 mg/dL — ABNORMAL HIGH (ref 0.3–0.9)
Total Bilirubin: 2.1 mg/dL — ABNORMAL HIGH (ref 0.3–1.2)
Total Protein: 7.7 g/dL (ref 6.5–8.1)

## 2020-08-31 LAB — COOXEMETRY PANEL
Carboxyhemoglobin: 1.7 % — ABNORMAL HIGH (ref 0.5–1.5)
Methemoglobin: 0.7 % (ref 0.0–1.5)
O2 Saturation: 67.1 %
Total hemoglobin: 8.2 g/dL — ABNORMAL LOW (ref 12.0–16.0)

## 2020-08-31 LAB — LACTATE DEHYDROGENASE, PLEURAL OR PERITONEAL FLUID: LD, Fluid: 89 U/L — ABNORMAL HIGH (ref 3–23)

## 2020-08-31 LAB — PROTEIN, PLEURAL OR PERITONEAL FLUID: Total protein, fluid: 4 g/dL

## 2020-08-31 LAB — GLUCOSE, CAPILLARY
Glucose-Capillary: 178 mg/dL — ABNORMAL HIGH (ref 70–99)
Glucose-Capillary: 207 mg/dL — ABNORMAL HIGH (ref 70–99)
Glucose-Capillary: 175 mg/dL — ABNORMAL HIGH (ref 70–99)
Glucose-Capillary: 191 mg/dL — ABNORMAL HIGH (ref 70–99)

## 2020-08-31 MED ORDER — LIDOCAINE HCL (PF) 1 % IJ SOLN
INTRAMUSCULAR | Status: DC | PRN
  Administered 2020-08-31: 10 mL

## 2020-08-31 MED ORDER — CARVEDILOL 6.25 MG PO TABS
6.2500 mg | ORAL_TABLET | Freq: Two times a day (BID) | ORAL | Status: DC
  Administered 2020-08-31 (×2): 6.25 mg via ORAL
  Filled 2020-08-31 (×3): qty 1

## 2020-08-31 MED ORDER — METOLAZONE 5 MG PO TABS
5.0000 mg | ORAL_TABLET | Freq: Once | ORAL | Status: AC
  Administered 2020-08-31: 5 mg via ORAL
  Filled 2020-08-31: qty 1

## 2020-08-31 MED ORDER — SENNOSIDES-DOCUSATE SODIUM 8.6-50 MG PO TABS
1.0000 | ORAL_TABLET | Freq: Two times a day (BID) | ORAL | Status: DC
  Administered 2020-08-31 – 2020-09-08 (×16): 1 via ORAL
  Filled 2020-08-31 (×16): qty 1

## 2020-08-31 MED ORDER — POLYETHYLENE GLYCOL 3350 17 G PO PACK
17.0000 g | PACK | Freq: Two times a day (BID) | ORAL | Status: DC
  Administered 2020-08-31 – 2020-09-07 (×10): 17 g via ORAL
  Filled 2020-08-31 (×15): qty 1

## 2020-08-31 MED ORDER — LIDOCAINE HCL 1 % IJ SOLN
INTRAMUSCULAR | Status: AC
  Filled 2020-08-31: qty 20

## 2020-08-31 MED ORDER — POTASSIUM CHLORIDE CRYS ER 20 MEQ PO TBCR
40.0000 meq | EXTENDED_RELEASE_TABLET | Freq: Once | ORAL | Status: AC
  Administered 2020-08-31: 40 meq via ORAL
  Filled 2020-08-31: qty 2

## 2020-08-31 MED ORDER — SENNOSIDES-DOCUSATE SODIUM 8.6-50 MG PO TABS: 1.0000 | ORAL_TABLET | Freq: Two times a day (BID) | ORAL | Status: DC

## 2020-08-31 MED ORDER — ACETAZOLAMIDE 250 MG PO TABS
250.0000 mg | ORAL_TABLET | Freq: Once | ORAL | Status: AC
  Administered 2020-08-31: 250 mg via ORAL
  Filled 2020-08-31: qty 1

## 2020-08-31 NOTE — Progress Notes (Signed)
Patient ID: Rita [name], female   DOB: Oct 01, 1956, 64 y.o.   MRN: [id number]     Advanced Heart Failure Rounding Note  PCP-Cardiologist: No primary care provider on file.   Subjective:    Milrinone gtt 0.25 started yesterday evening.  She is on Lasix gtt 12 mg/hr and got metolazone 2.5 yesterday.  More UOP but still not vigorous.  Creatinine 2.11 => 2.23.  CVP 23 this morning, co-ox 57% => 67%.   Cardiac MRI:  1.  Normal LV size with EF 41%, diffuse hypokinesis. 2. Mildly dilated RV with severe systolic dysfunction, EF 32%. D-shaped septum suggests RV pressure/volume overload. 3.  Markedly dilated IVC suggestive of elevated RV filling pressure. 4. Nonspecific RV insertion site LGE, this suggests LV pressure/volume overload. 5. Moderate pleural effusion on right.    Objective:   Weight Range: 117.7 kg Body mass index is 49.03 kg/m.   Vital Signs:   Temp:  [97.5 F (36.4 C)-98.3 F (36.8 C)] 98.2 F (36.8 C) (09/23 0426) Pulse Rate:  [47-93] 80 (09/23 0426) Resp:  [18-23] 21 (09/23 0426) BP: (101-117)/(54-65) 110/65 (09/23 0426) SpO2:  [92 %-96 %] 96 % (09/23 0426) Weight:  [117.7 kg] 117.7 kg (09/23 0424) Last BM Date: 08/27/20  Weight change: Filed Weights   08/29/20 0326 08/30/20 0335 08/31/20 0424  Weight: 118 kg 117.8 kg 117.7 kg    Intake/Output:   Intake/Output Summary (Last 24 hours) at 08/31/2020 0827 Last data filed at 08/31/2020 9518 Gross per 24 hour  Intake 916.29 ml  Output 1600 ml  Net -683.71 ml      Physical Exam    General: NAD Neck: JVP 16+, no thyromegaly or thyroid nodule.  Lungs: Decreased BS on right.  CV: Nondisplaced PMI.  Heart irregular S1/S2, no S3/S4, no murmur.  1+ chronic edema to knees bilaterally.   Abdomen: Soft, nontender, no hepatosplenomegaly, no distention.  Skin: Intact without lesions or rashes.  Neurologic: Alert and oriented x 3.  Psych: Normal affect. Extremities: No clubbing or cyanosis.  HEENT:  Normal.    Telemetry   Atrial fibrillation in 80s (personally reviewed)  Labs    CBC Recent Labs    08/29/20 0410 08/31/20 0229  WBC 4.0 4.0  NEUTROABS 2.5  --   HGB 8.3* 7.6*  HCT 27.0* 25.2*  MCV 84.4 86.0  PLT 84* 88*   Basic Metabolic Panel Recent Labs    08/29/20 0410 08/29/20 0410 08/30/20 1522 08/31/20 0229  NA 139   < > 138 140  K 4.0   < > 3.7 3.9  CL 98   < > 100 96*  CO2 33*   < > 33* 35*  GLUCOSE 151*   < > 168* 255*  BUN 38*   < > 41* 41*  CREATININE 1.93*   < > 2.11* 2.23*  CALCIUM 9.3   < > 9.1 9.0  MG 2.2  --   --   --   PHOS 4.3  --   --   --    < > = values in this interval not displayed.   Liver Function Tests No results for input(s): AST, ALT, ALKPHOS, BILITOT, PROT, ALBUMIN in the last 72 hours. No results for input(s): LIPASE, AMYLASE in the last 72 hours. Cardiac Enzymes No results for input(s): CKTOTAL, CKMB, CKMBINDEX, TROPONINI in the last 72 hours.  BNP: BNP (last 3 results) Recent Labs    06/27/20 0141 07/19/20 1752 08/28/20 0455  BNP 723.7* 620.4* 673.5*  ProBNP (last 3 results) No results for input(s): PROBNP in the last 8760 hours.   D-Dimer No results for input(s): DDIMER in the last 72 hours. Hemoglobin A1C No results for input(s): HGBA1C in the last 72 hours. Fasting Lipid Panel No results for input(s): CHOL, HDL, LDLCALC, TRIG, CHOLHDL, LDLDIRECT in the last 72 hours. Thyroid Function Tests No results for input(s): TSH, T4TOTAL, T3FREE, THYROIDAB in the last 72 hours.  Invalid input(s): FREET3  Other results:   Imaging    DG Chest Port 1 View  Result Date: 08/30/2020 CLINICAL DATA:  PICC placement. EXAM: PORTABLE CHEST 1 VIEW COMPARISON:  Chest x-ray dated August 27, 2020. FINDINGS: New right upper extremity PICC line with tip in the mid SVC. Stable cardiomegaly. Similar pulmonary vascular congestion with small to moderate right pleural effusion and right basilar atelectasis. Mild left basilar  atelectasis. No pneumothorax. No acute osseous abnormality. IMPRESSION: 1. New right upper extremity PICC line with tip in the mid SVC. 2. Unchanged right pleural effusion. Electronically Signed   By: Titus Dubin M.D.   On: 08/30/2020 15:53   MR CARDIAC MORPHOLOGY W WO CONTRAST  Result Date: 08/30/2020 CLINICAL DATA:  Cardiomyopathy of uncertain etiology. EXAM: CARDIAC MRI TECHNIQUE: The patient was scanned on a 1.5 Tesla GE magnet. A dedicated cardiac coil was used. Functional imaging was done using Fiesta sequences. 2,3, and 4 chamber views were done to assess for RWMA's. Modified Simpson's rule using a short axis stack was used to calculate an ejection fraction on a dedicated work Conservation officer, nature. The patient received 10 cc of Gadavist. After 10 minutes inversion recovery sequences were used to assess for infiltration and scar tissue. CONTRAST:  Gadavist 10 cc FINDINGS: Moderate right pleural effusion with associated atelectasis. Normal left ventricular size with diffuse hypokinesis, EF 41%. Mildly dilated right ventricle with severe systolic dysfunction, EF 49%. D-shaped interventricular septum suggestive of RV volume/pressure overload. Moderate biatrial enlargement. Trileaflet aortic valve, no stenosis or regurgitation. Trivial mitral regurgitation. Mild to moderate tricuspid regurgitation. Markedly dilated IVC suggestive of elevated RV filling pressure. On delayed enhancement imaging, there was a small area of late gadolinium enhancement (LGE) at the basal inferoseptal RV insertion site. Measurements: LVEDV 154 mL LVSV 62 mL LVEF 41% RVEDV 188 mL RVSV 51 mL RVEF 27% IMPRESSION: 1.  Normal LV size with EF 41%, diffuse hypokinesis. 2. Mildly dilated RV with severe systolic dysfunction, EF 44%. D-shaped septum suggests RV pressure/volume overload. 3.  Markedly dilated IVC suggestive of elevated RV filling pressure. 4. Nonspecific RV insertion site LGE, this suggests LV pressure/volume  overload. This study is suggestive of R > L heart failure. Maycel Riffe Electronically Signed   By: Loralie Champagne M.D.   On: 08/30/2020 23:24   [place] EKG SITE RITE  Result Date: 08/30/2020 If Site Rite image not attached, placement could not be confirmed due to current cardiac rhythm.    Medications:     Scheduled Medications: . allopurinol  100 mg Oral BID  . aspirin EC  81 mg Oral Daily  . carvedilol  6.25 mg Oral BID WC  . Chlorhexidine Gluconate Cloth  6 each Topical Daily  . enoxaparin (LOVENOX) injection  40 mg Subcutaneous Q24H  . insulin aspart  0-15 Units Subcutaneous TID WC  . metolazone  5 mg Oral Once  . pantoprazole  40 mg Oral Daily  . potassium chloride  40 mEq Oral Once  . senna  1 tablet Oral BID  . sodium chloride flush  10-40 mL Intracatheter Q12H    Infusions: . sodium chloride Stopped (08/29/20 1823)  . furosemide (LASIX) infusion 12 mg/hr (08/31/20 0300)  . milrinone 0.25 mcg/kg/min (08/31/20 0300)    PRN Medications: sodium chloride, acetaminophen, ondansetron (ZOFRAN) IV, [COMPLETED] polyethylene glycol **FOLLOWED BY** polyethylene glycol, sodium chloride flush   Assessment/Plan   1. Acute on chronic systolic CHF: Prominent signs of RV failure.  I reviewed 9/21 echo, EF 40-45%, global hypokinesis, D-shaped IV septum suggestive of RV pressure/volume overload, RV poorly visualized but appeared normal in size with at least mild systolic dysfunction. Cause of cardiomyopathy is uncertain.  She has history of COVID-19 1/21, cannot rule out viral myocarditis. Chemotherapy-mediated CMP is possible (not sure what chemo she had remotely for breast cancer).  Cardiac MRI showed LV EF 41% with diffuse hypokinesis, RV mildly dilated but with EF 27% and D-shaped septum, nonspecific RV insertion site LGE.  Suspect RV > LV failure. Cardiac MRI is not suggestive of amyloidosis.  We have not ruled out CAD.  She has had recurrent HF admissions this year.  On exam, she remains  volume overloaded with CVP 23.  Milrinone started primarily for RV support, co-ox up to 67% this morning . Still not diuresing well.  - Increase Lasix gtt to 15 mg/hr and will give metolazone 5 mg x 1.  Repeat BMET in pm.  - Will give 1 dose acetazolamide 250 mg.  - Continue milrinone 0.25 mcg/kg/min.  - Decrease Coreg to 6.25 mg bid.  - No ARNI/spironolactone for now with elevated creatinine.  - Myeloma panel, urine immunofixation pending.  - Consider RHC/LHC this admission if creatinine stabilizes.  Will need to minimize contrast.  - She is a Cardiomems candidate.  2. Chronic hypoxemic respiratory failure: Suspect OHS/OSA.  She was using home oxygen prior to COVID-19 PNA.  - Continue supplemental oxygen.   - Will need sleep study as outpatient.  3. Atrial fibrillation: Chronic, all ECGs since 7/21 show atrial fibrillation.  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 Rate control is reasonable.  - She cannot be cardioverted at this point as she cannot be chronically anticoagulated.  - Consider Watchman device placement, though would need 45 days warfarin and Plavix x 6 months after that.  4. GI bleeding: Recurrent, from small bowel AVMs. No overt bleeding. Hgb trending down, 7.6 today.  - Send Fe studies, ?feraheme.  - Transfuse hgb < 7.  5. Right pleural effusion: Suspect due to CHF.  Thoracentesis today, send fluid/serum LDH and protein.  6. 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  7. CKD: Stage 3.  Follow creatinine closely with diuresis, BMET in pm.  8. Thrombocytopenia: Mild, stable.  Pending myeloma panel.   Length of Stay: 3  Loralie Champagne, MD  08/31/2020, 8:27 AM  Advanced Heart Failure Team Pager [phone] (M-F; 7a - 4p)  Please contact [facility] for night-coverage after hours (4p -7a ) and weekends on [email or web address]

## 2020-08-31 NOTE — Progress Notes (Signed)
Subjective:  Rita [name] states that she feels about the same as she did yesterday, but that yesterday "wore me out." She states that the cMRI and PICC line placement were stressful for her. She endorses mild SOB but denies any CP. She states that she has a normal appetite and is having no issues with eating or drinking. She still has not had a bowel movement despite senna x2 yesterday although she feels she continues to pass gas. No abdominal pain at this time, but does feel "bloated." Denies nausea or vomiting.  Objective:  Vital signs in last 24 hours: Vitals:   08/31/20 0424 08/31/20 0426 08/31/20 0836 08/31/20 1248  BP:  110/65 (!) 116/57   Pulse:  80 80   Resp:  (!) 21 18   Temp:  98.2 F (36.8 C) 98.3 F (36.8 C) 97.8 F (36.6 C)  TempSrc:  Oral Oral Oral  SpO2:  96% 95%   Weight: 117.7 kg     Height:       Weight change: -0.136 kg  Intake/Output Summary (Last 24 hours) at 08/31/2020 1500 Last data filed at 08/31/2020 0850 Gross per 24 hour  Intake 1036.29 ml  Output 1200 ml  Net -163.71 ml   Physical Exam Vitals and nursing note reviewed.  Constitutional:      General: She is not in acute distress.    Interventions: Nasal cannula in place.     Comments: 2L nasal cannula  Cardiovascular:     Rate and Rhythm: Rhythm irregularly irregular.     Heart sounds: No murmur heard.   Pulmonary:     Effort: Pulmonary effort is normal. No tachypnea.     Breath sounds: Decreased breath sounds (Decreased breath sounds at lung base bilaterally. R>L) present.  Abdominal:     Tenderness: There is no abdominal tenderness.  Musculoskeletal:     Right lower leg: 2+ Pitting Edema present.     Left lower leg: 2+ Pitting Edema present.    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 Patient was hospitalized 1 month ago with similar symptomsand home meds were switched from Lasix $RemoveBe'40mg'zCAtSInyg$  daily to torsemide $RemoveBefo'20mg'SOxbKegUzbw$  daily. S/p metolazone x1 yesterday and currently on Lasix drip at 12 mg/hr. Urine output increased at 1600 mL over past 24 hours, but still volume overloaded on exam. PICC line in place. CVP 23.  BNP 673.5, similar to past admissions. Troponins 139 --> 148. -CXR with pulmonary venous congestion and bilateral pleural effusions. EKG with no new ischemic changesbut low voltage. Echo with EF 40-45%, indeterminate diastolic function, moderately elevated pulmonary artery systolic pressure, no aortic stenosis, tricuspid regurgitation, and trivial mitral regurgitation.  -Cardiac MRI with diffuse hypokinesis of LV, mildly dilated RV with severe systolic dysfunction, markedly dilated IVC suggestive of elevated RV filling pressure. Consistent with R>L heart failure. No evidence of amyloidosis or other infiltrative process on imaging.  Still not having as much urination as we would like. Weight remains essentially unchanged.  -  Advanced HF team following with recommendations for the following changes  -Increasing Lasix drip 15 mg/hr   - Metolazone 5mg  x1   - Acetazolamide 250mg  x1  - Coreg decreased to 6.25mg     - Therapeutic thoracentesis today to hopefully improve respiratory status - HF team to consider RHC/LHC later this week if creatinine stable - Continue home O2 of 2L - Daily weights - Strict I/Os - Written instructions for home diuretic titration upon discharge  Thrombocytopenia: Chronic Normocytic  Anemia: Thrombocytopenia of unknown origin in a patient with a history of cancer, chemotherapy, and radiation. Some workup done at last admission: abdominal US without splemomegaly, normal haptoglobin, and peripheral smear with normocytic anemia and thrombocytopenia. Has ahistory of small bowel AVM's and reports dark stools 2/2 iron.Peripheral smear with thrombocytopenia and circulating NRBCs.Less likely to be hemolytic in nature. In the setting of chronic anemia and persistently low-normal WBC, consider isolated thrombocytopenia vs. pancytopenia. Platelets stable around 88.Hemoglobin8.9--> 8.3--> 7.6. Patient has history of normocytic anemia and takes iron supplements at home.Iron, Ferritin, and TIBC all WNL.Reticulocyte percentage (1.3%) inappropriately low given her anemia. Increased immature retic fraction at 19.5%.  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 SPEP/UPEP pending.  - No iron supplementation needed for now  Constipation: Last bowel movement five days ago. Still passing gas.  - Changed Senna to Senokot-S BID  - Scheduled Miralax BID  - Consider enema if no BM tomorrow  Persistent Atrial Fibrillation: EKGwithAfib anda rate of 75 bpm. 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 - Continue ASA 81mg  daily  - Telemetry - Not a candidate for cardioversion as she cannot be anticoagulated due to her history of GI bleeding 2/2 AVM  Acute on chronicCKDvs. Progression of CKD IIIb: Creatinine 2.06 --> 1.93 --> 2.23 - Daily BMPs  T2DM: Does not check blood sugar at home. Home meds are Metformin 500mg  in the morning only and glipizide 5mg  daily. A1c 7.5. Glucose 255 this am. Not at goal  with SSI alone. Received 5 units total yesterday.  - CBG within range this afternoon.  - If CBGs remain elevated will start lantus 3 units daily - Continue SSI with moderate correction coverage -Hold home meds for now - Consideradding empaglaflozin to optimize cardioprotection  Controlled HTN: Persistently bradycardic on home dose of Carvedilol 12.5mg . Vitals stabilized on decreased dose of carvedilol 6.25mg  daily per HF recs. - Consider adding ACEI/ARB/ARNI for cardioprotective effects, but hold off until her AKI resolves - Daily vitals  Osteoarthritis of the Knees: Chronic bilateral knee pain impacting patient mobility. Controlled at home with PRN Tylenol.Seen by PT/OT, who recommended home health PT/OT. - Tylenol 650mg  q4 PRN -Home health PT/OT on discharge.    LOS: 3 days   Jeralyn Bennett, MD 08/31/2020, 3:00 PM   Written with help of Pearla Dubonnet, Medical Student.

## 2020-08-31 NOTE — Procedures (Signed)
PROCEDURE SUMMARY:  Successful US guided diagnostic and therapeutic right thoracentesis. Yielded 500 mL of amber fluid. Pt tolerated procedure well. No immediate complications.  Specimen was sent for labs. CXR ordered.  EBL < 5 mL  Docia Barrier PA-C 08/31/2020 3:55 PM

## 2020-08-31 NOTE — Progress Notes (Signed)
Physical Therapy Treatment Patient Details Name: Rita [name] MRN: [id number] DOB: [birth year] Today's Date: 08/31/2020    History of Present Illness 64 yo female with LE edema and SOB was noted to have CHF.  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, pulmonary HTN, persistent Afib, and diastolic HF on supplmental O2 prn    PT Comments    Pt with limited participation in session; pt very fatigued and requesting to get in bed for breakfast, RN present during session to monitor and give meds; pt continues to be limited by pain, weakness and poor endurance; pt will benefit from skilled PT to address deficits to maximize independence with functional mobility prior to discharge.     Follow Up Recommendations  Home health PT     Equipment Recommendations  None recommended by PT    Recommendations for Other Services       Precautions / Restrictions Precautions Precautions: Fall Restrictions Weight Bearing Restrictions: No    Mobility  Bed Mobility Overal bed mobility: Needs Assistance Bed Mobility: Sidelying to Sit   Sidelying to sit: Min assist;HOB elevated          Transfers Overall transfer level: Needs assistance Equipment used: Rolling walker (2 wheeled) Transfers: Sit to/from Stand Sit to Stand: Min assist         General transfer comment: stand step transfer with RW; increased weight bearing through B UEs; forward flexed posture; wide BOS  Ambulation/Gait                 Stairs             Wheelchair Mobility    Modified Rankin (Stroke Patients Only)       Balance Overall balance assessment: Needs assistance Sitting-balance support: Feet supported Sitting balance-Leahy Scale: Fair     Standing balance support: Bilateral upper extremity supported Standing balance-Leahy Scale: Poor                              Cognition Arousal/Alertness: Awake/alert Behavior During Therapy: WFL for tasks  assessed/performed Overall Cognitive Status: Within Functional Limits for tasks assessed                                 General Comments: VSS, RN present during session      Exercises      General Comments General comments (skin integrity, edema, etc.): pt with decreased energy and motivation to move; pt motivated to get to chair to eat breakfast      Pertinent Vitals/Pain Pain Assessment: 0-10 Pain Score: 3  Pain Location: R LE Pain Descriptors / Indicators: Aching Pain Intervention(s): Limited activity within patient's tolerance    Home Living                      Prior Function            PT Goals (current goals can now be found in the care plan section) Acute Rehab PT Goals Patient Stated Goal: Get back home. PT Goal Formulation: With patient Time For Goal Achievement: 09/12/20 Potential to Achieve Goals: Fair Progress towards PT goals: PT to reassess next treatment (pt limited today with increased lethargy and requesting to limit session to get into chair in order to eat breakfast)    Frequency    Min 3X/week  PT Plan Current plan remains appropriate    Co-evaluation              AM-PAC PT "6 Clicks" Mobility   Outcome Measure  Help needed turning from your back to your side while in a flat bed without using bedrails?: A Little Help needed moving from lying on your back to sitting on the side of a flat bed without using bedrails?: A Little Help needed moving to and from a bed to a chair (including a wheelchair)?: A Little Help needed standing up from a chair using your arms (e.g., wheelchair or bedside chair)?: A Little Help needed to walk in hospital room?: A Little Help needed climbing 3-5 steps with a railing? : A Little 6 Click Score: 18    End of Session Equipment Utilized During Treatment: Gait belt;Oxygen Activity Tolerance: Patient limited by fatigue;Patient limited by pain Patient left: in chair;with  nursing/sitter in room;with call bell/phone within reach Nurse Communication: Mobility status PT Visit Diagnosis: Unsteadiness on feet (R26.81);Muscle weakness (generalized) (M62.81)     Time: 5992-3414 PT Time Calculation (min) (ACUTE ONLY): 11 min  Charges:  $Therapeutic Activity: 8-22 mins                    Lyanne Co, DPT Acute Rehabilitation Services [phone]   [place] 08/31/2020, 9:49 AM

## 2020-09-01 DIAGNOSIS — I5021 Acute systolic (congestive) heart failure: Secondary | ICD-10-CM

## 2020-09-01 LAB — BASIC METABOLIC PANEL
Anion gap: 9 (ref 5–15)
Anion gap: 10 (ref 5–15)
BUN: 40 mg/dL — ABNORMAL HIGH (ref 8–23)
BUN: 40 mg/dL — ABNORMAL HIGH (ref 8–23)
CO2: 35 mmol/L — ABNORMAL HIGH (ref 22–32)
CO2: 37 mmol/L — ABNORMAL HIGH (ref 22–32)
Calcium: 9.1 mg/dL (ref 8.9–10.3)
Calcium: 9.4 mg/dL (ref 8.9–10.3)
Chloride: 95 mmol/L — ABNORMAL LOW (ref 98–111)
Chloride: 91 mmol/L — ABNORMAL LOW (ref 98–111)
Creatinine, Ser: 2.19 mg/dL — ABNORMAL HIGH (ref 0.44–1.00)
Creatinine, Ser: 2.26 mg/dL — ABNORMAL HIGH (ref 0.44–1.00)
GFR calc Af Amer: 26 mL/min — ABNORMAL LOW (ref >60)
GFR calc Af Amer: 25 mL/min — ABNORMAL LOW (ref >60)
GFR calc non Af Amer: 23 mL/min — ABNORMAL LOW (ref >60)
GFR calc non Af Amer: 22 mL/min — ABNORMAL LOW (ref >60)
Glucose, Bld: 186 mg/dL — ABNORMAL HIGH (ref 70–99)
Glucose, Bld: 169 mg/dL — ABNORMAL HIGH (ref 70–99)
Potassium: 3.8 mmol/L (ref 3.5–5.1)
Potassium: 4 mmol/L (ref 3.5–5.1)
Sodium: 139 mmol/L (ref 135–145)
Sodium: 138 mmol/L (ref 135–145)

## 2020-09-01 LAB — BODY FLUID CELL COUNT WITH DIFFERENTIAL
Eos, Fluid: 1 %
Lymphs, Fluid: 11 %
Monocyte-Macrophage-Serous Fluid: 83 % (ref 50–90)
Neutrophil Count, Fluid: 5 % (ref 0–25)
Total Nucleated Cell Count, Fluid: 152 cu mm (ref 0–1000)

## 2020-09-01 LAB — MULTIPLE MYELOMA PANEL, SERUM
Albumin SerPl Elph-Mcnc: 3.6 g/dL (ref 2.9–4.4)
Albumin/Glob SerPl: 1 (ref 0.7–1.7)
Alpha 1: 0.2 g/dL (ref 0.0–0.4)
Alpha2 Glob SerPl Elph-Mcnc: 0.5 g/dL (ref 0.4–1.0)
B-Globulin SerPl Elph-Mcnc: 1 g/dL (ref 0.7–1.3)
Gamma Glob SerPl Elph-Mcnc: 2 g/dL — ABNORMAL HIGH (ref 0.4–1.8)
Globulin, Total: 3.7 g/dL (ref 2.2–3.9)
IgA: 541 mg/dL — ABNORMAL HIGH (ref 87–352)
IgG (Immunoglobin G), Serum: 2182 mg/dL — ABNORMAL HIGH (ref 586–1602)
IgM (Immunoglobulin M), Srm: 101 mg/dL (ref 26–217)
Total Protein ELP: 7.3 g/dL (ref 6.0–8.5)

## 2020-09-01 LAB — GLUCOSE, CAPILLARY
Glucose-Capillary: 175 mg/dL — ABNORMAL HIGH (ref 70–99)
Glucose-Capillary: 170 mg/dL — ABNORMAL HIGH (ref 70–99)
Glucose-Capillary: 167 mg/dL — ABNORMAL HIGH (ref 70–99)
Glucose-Capillary: 136 mg/dL — ABNORMAL HIGH (ref 70–99)

## 2020-09-01 LAB — CBC
HCT: 24.5 % — ABNORMAL LOW (ref 36.0–46.0)
Hemoglobin: 7.4 g/dL — ABNORMAL LOW (ref 12.0–15.0)
MCH: 25.9 pg — ABNORMAL LOW (ref 26.0–34.0)
MCHC: 30.2 g/dL (ref 30.0–36.0)
MCV: 85.7 fL (ref 80.0–100.0)
Platelets: 88 10*3/uL — ABNORMAL LOW (ref 150–400)
RBC: 2.86 MIL/uL — ABNORMAL LOW (ref 3.87–5.11)
RDW: 18.4 % — ABNORMAL HIGH (ref 11.5–15.5)
WBC: 4.1 10*3/uL (ref 4.0–10.5)
nRBC: 0 % (ref 0.0–0.2)

## 2020-09-01 LAB — COOXEMETRY PANEL
Carboxyhemoglobin: 2.4 % — ABNORMAL HIGH (ref 0.5–1.5)
Methemoglobin: 1.7 % — ABNORMAL HIGH (ref 0.0–1.5)
O2 Saturation: 80.4 %
Total hemoglobin: 8.2 g/dL — ABNORMAL LOW (ref 12.0–16.0)

## 2020-09-01 MED ORDER — ACETAZOLAMIDE 250 MG PO TABS
250.0000 mg | ORAL_TABLET | Freq: Every day | ORAL | Status: DC
  Administered 2020-09-01 – 2020-09-03 (×3): 250 mg via ORAL
  Filled 2020-09-01 (×3): qty 1

## 2020-09-01 MED ORDER — CARVEDILOL 3.125 MG PO TABS
3.1250 mg | ORAL_TABLET | Freq: Two times a day (BID) | ORAL | Status: DC
  Administered 2020-09-01 – 2020-09-08 (×14): 3.125 mg via ORAL
  Filled 2020-09-01 (×14): qty 1

## 2020-09-01 MED ORDER — METOLAZONE 5 MG PO TABS
5.0000 mg | ORAL_TABLET | Freq: Two times a day (BID) | ORAL | Status: AC
  Administered 2020-09-01 (×2): 5 mg via ORAL
  Filled 2020-09-01 (×2): qty 1

## 2020-09-01 MED ORDER — POTASSIUM CHLORIDE CRYS ER 20 MEQ PO TBCR
40.0000 meq | EXTENDED_RELEASE_TABLET | Freq: Once | ORAL | Status: AC
  Administered 2020-09-01: 40 meq via ORAL
  Filled 2020-09-01: qty 2

## 2020-09-01 NOTE — Plan of Care (Signed)
°  Problem: Education: °Goal: Ability to demonstrate management of disease process will improve °Outcome: Progressing °Goal: Ability to verbalize understanding of medication therapies will improve °Outcome: Progressing °Goal: Individualized Educational Video(s) °Outcome: Progressing °  °

## 2020-09-01 NOTE — Progress Notes (Signed)
Patient ID: Rita [name], female   DOB: 05/08/1956, 64 y.o.   MRN: [id number]     Advanced Heart Failure Rounding Note  PCP-Cardiologist: No primary care provider on file.   Subjective:    Milrinone gtt 0.25 started yesterday evening.  She is on Lasix gtt 15 mg/hr and got metolazone 5 yesterday along with a dose of acetazolamide.  Still not vigorous UOP recorded though weight down some.  Creatinine 2.11 => 2.23 => 2.19.  CVP 20 this morning (lower), co-ox 80%.  Right thoracentesis 9/23, 500 cc off => exudative by protein but not LDH.    Cardiac MRI:  1.  Normal LV size with EF 41%, diffuse hypokinesis. 2. Mildly dilated RV with severe systolic dysfunction, EF 48%. D-shaped septum suggests RV pressure/volume overload. 3.  Markedly dilated IVC suggestive of elevated RV filling pressure. 4. Nonspecific RV insertion site LGE, this suggests LV pressure/volume overload. 5. Moderate pleural effusion on right.    Objective:   Weight Range: 116.1 kg Body mass index is 48.37 kg/m.   Vital Signs:   Temp:  [97.8 F (36.6 C)-98.7 F (37.1 C)] 98 F (36.7 C) (09/24 0321) Pulse Rate:  [74-97] 97 (09/24 0400) Resp:  [18-21] 21 (09/24 0400) BP: (92-122)/(44-69) 113/62 (09/24 0400) SpO2:  [93 %-100 %] 93 % (09/24 0400) Weight:  [116.1 kg] 116.1 kg (09/24 0551) Last BM Date: 08/31/20  Weight change: Filed Weights   08/30/20 0335 08/31/20 0424 09/01/20 0551  Weight: 117.8 kg 117.7 kg 116.1 kg    Intake/Output:   Intake/Output Summary (Last 24 hours) at 09/01/2020 0811 Last data filed at 09/01/2020 0553 Gross per 24 hour  Intake 1400.99 ml  Output 1900 ml  Net -499.01 ml      Physical Exam    General: NAD Neck: JVP 16+, no thyromegaly or thyroid nodule.  Lungs: Decreased BS on right. CV: Nondisplaced PMI.  Heart regular S1/S2, no S3/S4, 2/6 SEM RUSB.  1+ chronic edema to knees.   Abdomen: Soft, nontender, no hepatosplenomegaly, no distention.  Skin: Intact without  lesions or rashes.  Neurologic: Alert and oriented x 3.  Psych: Normal affect. Extremities: No clubbing or cyanosis.  HEENT: Normal.    Telemetry   Atrial fibrillation in 80s (personally reviewed)  Labs    CBC Recent Labs    08/31/20 0229 09/01/20 0323  WBC 4.0 4.1  HGB 7.6* 7.4*  HCT 25.2* 24.5*  MCV 86.0 85.7  PLT 88* 88*   Basic Metabolic Panel Recent Labs    08/31/20 1655 09/01/20 0323  NA 139 139  K 3.9 3.8  CL 95* 95*  CO2 35* 35*  GLUCOSE 174* 186*  BUN 41* 40*  CREATININE 2.26* 2.19*  CALCIUM 9.0 9.1   Liver Function Tests Recent Labs    08/31/20 1655  AST 24  ALT 16  ALKPHOS 84  BILITOT 2.1*  PROT 7.7  ALBUMIN 3.5   No results for input(s): LIPASE, AMYLASE in the last 72 hours. Cardiac Enzymes No results for input(s): CKTOTAL, CKMB, CKMBINDEX, TROPONINI in the last 72 hours.  BNP: BNP (last 3 results) Recent Labs    06/27/20 0141 07/19/20 1752 08/28/20 0455  BNP 723.7* 620.4* 673.5*    ProBNP (last 3 results) No results for input(s): PROBNP in the last 8760 hours.   D-Dimer No results for input(s): DDIMER in the last 72 hours. Hemoglobin A1C No results for input(s): HGBA1C in the last 72 hours. Fasting Lipid Panel No results for input(s): CHOL,  HDL, LDLCALC, TRIG, CHOLHDL, LDLDIRECT in the last 72 hours. Thyroid Function Tests No results for input(s): TSH, T4TOTAL, T3FREE, THYROIDAB in the last 72 hours.  Invalid input(s): FREET3  Other results:   Imaging    DG Chest 1 View  Result Date: 08/31/2020 CLINICAL DATA:  Post thoracentesis on the right EXAM: CHEST  1 VIEW COMPARISON:  08/30/2020 FINDINGS: The right-sided pleural effusion has significantly decreased in size since the prior study. There is no right-sided pneumothorax. The heart remains enlarged. Streaky retrocardiac airspace opacities are again noted. The right-sided PICC line is relatively stable in positioning. There is no acute osseous abnormality. IMPRESSION:  1. Significant interval decrease in size of right-sided pleural effusion status post thoracentesis. No pneumothorax. 2. Otherwise, no significant short interval change. Electronically Signed   By: Constance Holster M.D.   On: 08/31/2020 16:26   IR THORACENTESIS ASP PLEURAL SPACE W/IMG GUIDE  Result Date: 08/31/2020 INDICATION: Patient with history of congestive heart failure, right pleural effusion. Request made for right diagnostic and therapeutic thoracentesis. EXAM: ULTRASOUND GUIDED DIAGNOSTIC AND THERAPEUTIC RIGHT THORACENTESIS MEDICATIONS: 10 mL 1% lidocaine. COMPLICATIONS: None immediate. PROCEDURE: An ultrasound guided thoracentesis was thoroughly discussed with the patient and questions answered. The benefits, risks, alternatives and complications were also discussed. The patient understands and wishes to proceed with the procedure. Written consent was obtained. Ultrasound was performed to localize and mark an adequate pocket of fluid in the right chest. The area was then prepped and draped in the normal sterile fashion. A 22 gauge spinal needle was used to administer 1% Lidocaine for local anesthesia. Under ultrasound guidance a 6 Fr Safe-T-Centesis catheter was introduced. Thoracentesis was performed. The catheter was removed and a dressing applied. FINDINGS: A total of approximately 500 mL of amber fluid was removed. Samples were sent to the laboratory as requested by the clinical team. IMPRESSION: Successful ultrasound guided right thoracentesis yielding 500 mL of pleural fluid. Read by: Brynda Greathouse PA-C Electronically Signed   By: Markus Daft M.D.   On: 08/31/2020 16:31     Medications:     Scheduled Medications: . acetaZOLAMIDE  250 mg Oral Daily  . allopurinol  100 mg Oral BID  . aspirin EC  81 mg Oral Daily  . carvedilol  3.125 mg Oral BID WC  . Chlorhexidine Gluconate Cloth  6 each Topical Daily  . enoxaparin (LOVENOX) injection  40 mg Subcutaneous Q24H  . insulin aspart  0-15  Units Subcutaneous TID WC  . metolazone  5 mg Oral BID  . pantoprazole  40 mg Oral Daily  . polyethylene glycol  17 g Oral BID  . potassium chloride  40 mEq Oral Once  . senna-docusate  1 tablet Oral BID  . sodium chloride flush  10-40 mL Intracatheter Q12H    Infusions: . sodium chloride Stopped (08/29/20 1823)  . furosemide (LASIX) infusion 15 mg/hr (08/31/20 1436)  . milrinone 0.25 mcg/kg/min (08/31/20 1350)    PRN Medications: sodium chloride, acetaminophen, lidocaine (PF), ondansetron (ZOFRAN) IV, sodium chloride flush   Assessment/Plan   1. Acute on chronic systolic CHF: Prominent signs of RV failure.  I reviewed 9/21 echo, EF 40-45%, global hypokinesis, D-shaped IV septum suggestive of RV pressure/volume overload, RV poorly visualized but appeared normal in size with at least mild systolic dysfunction. Cause of cardiomyopathy is uncertain.  She has history of COVID-19 1/21, cannot rule out viral myocarditis. Chemotherapy-mediated CMP is possible (not sure what chemo she had remotely for breast cancer).  Cardiac MRI showed  LV EF 41% with diffuse hypokinesis, RV mildly dilated but with EF 27% and D-shaped septum, nonspecific RV insertion site LGE.  Suspect RV > LV failure. Cardiac MRI is not suggestive of amyloidosis.  We have not ruled out CAD.  She has had recurrent HF admissions this year.  On exam, she remains volume overloaded with CVP 20.  Milrinone started primarily for RV support, co-ox up to 80% this morning . Still not diuresing well though weight is down and CVP also is down some. BUN/creatinine stable.  - Increase Lasix gtt to 20 mg/hr and will give metolazone 5 mg bid today.  Repeat BMET in pm.  - Acetazolamide 250 mg daily.  - Continue milrinone 0.25 mcg/kg/min.  - Decrease Coreg to 3.125 mg bid.  - No ARNI/spironolactone for now with elevated creatinine.  - Myeloma panel showed no M-spike. Urine IFXN pending.  - Consider RHC/LHC this admission if creatinine stabilizes.   Will need to minimize contrast.  - She is a Cardiomems candidate.  2. Chronic hypoxemic respiratory failure: Suspect OHS/OSA.  She was using home oxygen prior to COVID-19 PNA.  - Continue supplemental oxygen.   - Will need sleep study as outpatient.  3. Atrial fibrillation: Chronic, all ECGs since 7/21 show atrial fibrillation.  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 Rate control is reasonable.  - She cannot be cardioverted at this point as she cannot be chronically anticoagulated.  - Consider Watchman device placement, though would need 45 days warfarin and Plavix x 6 months after that.  4. GI bleeding: Recurrent, from small bowel AVMs. No overt bleeding. Hgb trending down, 7.4 today.  - Send Fe studies, ?feraheme.  - Transfuse hgb < 7.  5. Right pleural effusion: Suspect due to CHF.  Had right thoracentesis with 500 cc off on 9/23, pleural fluid protein suggested exudative though PF LDH did not.   6. 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  7. CKD: Stage 3.  Follow creatinine closely with diuresis, BMET in pm.  8. Thrombocytopenia: Mild, stable.     Length of Stay: [place], MD  09/01/2020, 8:11 AM  Advanced Heart Failure Team Pager [phone] (M-F; 7a - 4p)  Please contact [facility] for night-coverage after hours (4p -7a ) and weekends on [email or web address]

## 2020-09-01 NOTE — Progress Notes (Signed)
Subjective:  Patient states that she was feeling tired after her thoracentesis yesterday, although notes she slept well through the night and says her SOB has improved since the procedure. She has had increased urine output with her medication adjustments by heart failure team. Reports good appetite. Continues to pass gas but says she has not yet had a bowel movement. Denies nausea or vomiting, fevers, chills, or any other symptoms.   Objective:  Vital signs in last 24 hours: Vitals:   09/01/20 0321 09/01/20 0400 09/01/20 0551 09/01/20 0922  BP: (!) 122/58 113/62  120/64  Pulse: 82 97    Resp: 19 (!) 21    Temp: 98 F (36.7 C)     TempSrc: Oral     SpO2: 96% 93%    Weight:   116.1 kg   Height:       Weight change: -1.588 kg  Intake/Output Summary (Last 24 hours) at 09/01/2020 1408 Last data filed at 09/01/2020 0900 Gross per 24 hour  Intake 1280.99 ml  Output 1900 ml  Net -619.01 ml   General: Patient is morbidly obese and appears tired but well. No acute distress. Eyes: Sclera non-icteric. No conjunctival injection.  Respiratory: There are decreased breath sounds throughout with minimal crackles in the bilateral bases. No wheezes or rhonchi.  Cardiovascular: Rate is normal rhythm is irregularly irregular. There is a 2/6 systolic murmur heard best at the right upper sternal border. There is bilateral pitting edema to the knees without significant pitting and chronic venous stasis changes.  Abdominal: Soft and non-tender to palpation. Bowel sounds intact. No rebound or guarding. Psych: Pleasant and cooperative.   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 Patient was hospitalized 1 month ago with similar symptomsand home meds were switched from Lasix $RemoveBe'40mg'CxJHxWfnl$  daily to torsemide $RemoveBefo'20mg'KWIvzSUgAto$  daily. S/p metolazone x1 yesterday and currently on Lasix drip at 12 mg/hr. Urine output increased at 1600 mL over past 24 hours, but still volume overloaded on exam. PICC line in place. CVP 23.  BNP 673.5, similar to past admissions. Troponins 139 --> 148.  -CXR with pulmonary venous congestion and bilateral pleural effusions. EKG with no new ischemic changesbut low voltage. Echo with EF 40-45%, indeterminate diastolic function, moderately elevated pulmonary artery systolic pressure, no aortic stenosis, tricuspid regurgitation, and trivial mitral regurgitation.  -Cardiac MRI with diffuse hypokinesis of LV, mildly dilated RV with severe systolic dysfunction, markedly dilated IVC suggestive of elevated RV filling pressure. Consistent with R>L heart failure. No evidence of amyloidosis or other infiltrative process on imaging with polygammopathy and no M spike on myeloma panel.   Patient is urinating more today with improvement in weight and CVP to 2-. Co-ox up to 80% this morning. BUN/creatinine stable. SOB improved s/p thoracentesis yesterday showing exudative fluid.  - Advanced HF team following with recommendations for the following changes  -Increasing Lasix drip to $Remov'20mg'geTaic$ /hr   - Metolazone $RemoveBefo'5mg'GUzUDULvodX$  BID today   - Acetazolamide $RemoveBeforeD'250mg'PVKWYxACajFhxX$  daily  -  Coreg decreased further to 3.125 mg BID  - Continue milrinone 0/25 mcg/kg/min   - Hold ARNI/spironolactone given elevated creatinine  - HF team to consider RHC/LHC at a later date if creatinine is stable  - Continue home O2 of 2L - Daily weights - Strict I/Os - HF team note patient is a  cardiomems candidate  - Will need sleep study as outpatient  - Consider initiating CPAP   Thrombocytopenia: Chronic Normocytic Anemia: Thrombocytopenia of unknown origin in a patient with a history of cancer, chemotherapy, and radiation. Some workup done at last admission: abdominal US without splemomegaly, normal haptoglobin, and peripheral smear with normocytic anemia and thrombocytopenia. Has ahistory of small bowel AVM's and reports dark stools 2/2 iron.Peripheral smear with thrombocytopenia and circulating NRBCs.Less likely to be hemolytic in nature. In the setting of chronic anemia and persistently low-normal WBC, consider isolated thrombocytopenia vs. pancytopenia. Platelets stable around 88.Hemoglobin8.9--> 8.3--> 7.6. Patient has history of normocytic anemia and takes iron supplements at home.Iron, Ferritin, and TIBC all WNL.Reticulocyte percentage (1.3%) inappropriately low given her anemia. Increased immature retic fraction at 19.5%.  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 UPEP pending although SPEP shows no M spike.   - No iron supplementation needed for now - Continue to monitor daily CBC   Constipation: Last bowel movement six days ago. Still passing gas.  - Continue Senokot-S and Miralax BID  - Patient is agreeable to trying enema tomorrow if no bowel movement today.   Persistent Atrial Fibrillation: EKGwithAfib anda rate of 75 bpm. 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 - Continue ASA 81mg  daily  - Telemetry - Not a candidate for cardioversion as she cannot be anticoagulated due to her history of GI bleeding 2/2 AVM  Acute on chronicCKDvs. Progression of CKD  IIIb: Creatinine and BUN stable.  - Consider RHC/LHC if continues to remain stable - Monitor daily renal function  Uncontrolled Type II DM: Does not check blood sugar at home. Home meds are Metformin 500mg  in the morning only and glipizide 5mg  daily. A1c 7.5. Glucose under slightly better control today in 170's on moderate SSI.  - Continue SSI with moderate correction coverage -Hold home meds for now - Consideradding empaglaflozin to optimize cardioprotection  Controlled HTN: Blood pressures well controlled and stable. HF team adjusting HF medications. - Consider adding ACEI/ARB/ARNI for cardioprotective effects, but hold off until her AKI resolves  Osteoarthritis of the Knees: Chronic bilateral knee pain impacting patient mobility. Controlled at home with PRN Tylenol.Seen by PT/OT, who recommended home health PT/OT. - Tylenol 650mg  q4 PRN -Home health PT/OT on discharge.    LOS: 4 days   Jeralyn Bennett, MD 09/01/2020, 2:08 PM

## 2020-09-01 NOTE — Care Management Important Message (Signed)
Important Message  Patient Details  Name: Rita [name] MRN: [id number] Date of Birth: 1956/06/18   Medicare Important Message Given:  Yes     [name] 09/01/2020, 10:38 AM

## 2020-09-02 DIAGNOSIS — I5023 Acute on chronic systolic (congestive) heart failure: Secondary | ICD-10-CM

## 2020-09-02 LAB — BASIC METABOLIC PANEL
Anion gap: 12 (ref 5–15)
BUN: 38 mg/dL — ABNORMAL HIGH (ref 8–23)
CO2: 35 mmol/L — ABNORMAL HIGH (ref 22–32)
Calcium: 9.4 mg/dL (ref 8.9–10.3)
Chloride: 93 mmol/L — ABNORMAL LOW (ref 98–111)
Creatinine, Ser: 2.23 mg/dL — ABNORMAL HIGH (ref 0.44–1.00)
GFR calc Af Amer: 26 mL/min — ABNORMAL LOW (ref >60)
GFR calc non Af Amer: 22 mL/min — ABNORMAL LOW (ref >60)
Glucose, Bld: 134 mg/dL — ABNORMAL HIGH (ref 70–99)
Potassium: 3.5 mmol/L (ref 3.5–5.1)
Sodium: 140 mmol/L (ref 135–145)

## 2020-09-02 LAB — CBC
HCT: 25.9 % — ABNORMAL LOW (ref 36.0–46.0)
Hemoglobin: 8 g/dL — ABNORMAL LOW (ref 12.0–15.0)
MCH: 26.1 pg (ref 26.0–34.0)
MCHC: 30.9 g/dL (ref 30.0–36.0)
MCV: 84.4 fL (ref 80.0–100.0)
Platelets: 94 10*3/uL — ABNORMAL LOW (ref 150–400)
RBC: 3.07 MIL/uL — ABNORMAL LOW (ref 3.87–5.11)
RDW: 18.3 % — ABNORMAL HIGH (ref 11.5–15.5)
WBC: 4.1 10*3/uL (ref 4.0–10.5)
nRBC: 0 % (ref 0.0–0.2)

## 2020-09-02 LAB — GLUCOSE, CAPILLARY
Glucose-Capillary: 151 mg/dL — ABNORMAL HIGH (ref 70–99)
Glucose-Capillary: 131 mg/dL — ABNORMAL HIGH (ref 70–99)
Glucose-Capillary: 174 mg/dL — ABNORMAL HIGH (ref 70–99)
Glucose-Capillary: 158 mg/dL — ABNORMAL HIGH (ref 70–99)
Glucose-Capillary: 213 mg/dL — ABNORMAL HIGH (ref 70–99)

## 2020-09-02 LAB — COOXEMETRY PANEL
Carboxyhemoglobin: 2.5 % — ABNORMAL HIGH (ref 0.5–1.5)
Methemoglobin: 1.7 % — ABNORMAL HIGH (ref 0.0–1.5)
O2 Saturation: 68.1 %
Total hemoglobin: 8 g/dL — ABNORMAL LOW (ref 12.0–16.0)

## 2020-09-02 MED ORDER — POTASSIUM CHLORIDE CRYS ER 20 MEQ PO TBCR
40.0000 meq | EXTENDED_RELEASE_TABLET | Freq: Two times a day (BID) | ORAL | Status: DC
  Administered 2020-09-02 – 2020-09-03 (×4): 40 meq via ORAL
  Filled 2020-09-02 (×4): qty 2

## 2020-09-02 NOTE — Progress Notes (Addendum)
Subjective:  Rita [name] states that she feels okay this morning. She endorses increased urine output yesterday and feels that her edema is somewhat improved. States that her breathing is "about the same" as yesterday.  She endorses some knee and ankle pain and stiffness, but attributes this to having been in bed for several days now. Preparing to walk the halls with PT.  Bowel movement last night which was normal.  Eager to go home, but understands that she is still quite volume overloaded and will require at least a few more days of diuresis.  She was previously scheduled to have an outpatient sleep study, but did not make it as she was hospitalized, preventing her from making it to the study. She is amenable to having one after discharge.   Objective:  Vital signs in last 24 hours: Vitals:   09/01/20 1748 09/01/20 1949 09/02/20 0000 09/02/20 0437  BP: 115/71 (!) 110/51 111/61 (!) 113/50  Pulse: (!) 105 86 72 77  Resp:  $Remo'18 19 19  'YLdYe$ Temp:  (!) 97.5 F (36.4 C) 98.5 F (36.9 C) 98.5 F (36.9 C)  TempSrc:  Oral Oral Oral  SpO2:  96% 96% 98%  Weight:    115.1 kg  Height:       Weight change: -1.021 kg  Intake/Output Summary (Last 24 hours) at 09/02/2020 1242 Last data filed at 09/02/2020 1000 Gross per 24 hour  Intake 1291.87 ml  Output 3000 ml  Net -1708.13 ml   Physical Exam Vitals and nursing note reviewed.  Constitutional:      General: She is not in acute distress.    Interventions: Nasal cannula in place.     Comments: 2L nasal cannula  Cardiovascular:     Rate and Rhythm: Rhythm irregularly irregular.     Heart sounds: Murmur (diffuse systolic) heard.  Systolic murmur is present with a grade of 2/6.   Pulmonary:     Breath sounds: Normal breath sounds.  Musculoskeletal:     Right lower leg: 2+ Pitting Edema present.     Left lower leg: 2+ Pitting Edema present.  Neurological:     Mental Status: She is alert.     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 Co-ox 68% this morning. BUN/creatinine stable. S/p metolazone $RemoveBefore'5mg'GwKdnfYVHeuxj$  x3 - Advanced HF team following. Continue with diuretic plan             - Lasix gtt $RemoveB'20mg'CTwMyXOH$ /hr              - Acetazolamide $RemoveBeforeD'250mg'ggacqmVoRgHhoq$  daily             - Coreg 3.125 mg BID             - Milrinone 0.25 mcg/kg/min              - Hold ARNI/spironolactone given elevated creatinine  - HF team to consider RHC/LHC at a later date if creatinine is stable  - Continue home O2 of 2L -  Daily weights - Strict I/Os - HF team note patient is a cardiomems candidate  - Sleep study as outpatient.   Constipation: BM last night. - Continue scheduled senna and miralax  Thrombocytopenia, stable: Chronic Normocytic Anemia: Thrombocytopenia of unknown origin in a patient with a history of cancer, chemotherapy, and radiation. Some workup done at last admission: abdominal US without splemomegaly, normal haptoglobin, and peripheral smear with normocytic anemia and thrombocytopenia. Has a history of small bowel AVM's and reports dark stools 2/2 iron. Peripheral smear with thrombocytopenia and circulating NRBCs.  Less likely to be hemolytic in nature. In the setting of chronic anemia and persistently low-normal WBC, consider isolated thrombocytopenia vs. pancytopenia. Platelets stable around 88. Hemoglobin 8.9--> 8.3--> 7.6 --> 8.0. Patient has history of normocytic anemia and takes iron supplements at home. Iron,  Ferritin, and TIBC all WNL. Reticulocyte percentage (1.3%) inappropriately low given her anemia. Increased immature retic fraction at 19.5%.  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 UPEP pending although SPEP shows no M spike.   - No iron supplementation needed for now - Continue to monitor daily CBC    Persistent Atrial Fibrillation: EKG with Afib and a rate of 75 bpm. 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 Currently on ASA $Remo'81mg'JwIDf$  daily. Patient denies any CP, dizziness or palpitations. Not a candidate for cardioversion as she cannot be anticoagulated due to her history of GI bleeding 2/2 AVM. - Continue ASA $RemoveBefo'81mg'fsqUqZlFLMM$  daily  - Telemetry   Acute on chronic CKD vs. Progression of CKD IIIb: Creatinine and BUN stable.  - Consider RHC/LHC if continues to remain stable - Monitor daily renal function   Uncontrolled Type II DM: Does not check blood sugar at home. Home meds are Metformin $RemoveBefor'500mg'sFbfYLopPSDy$  in the morning only and glipizide $RemoveBefor'5mg'zogVeyCRspZK$  daily. A1c 7.5. Glucose under slightly better control today in 130's-170's on moderate SSI.  - Continue SSI with moderate correction coverage - Hold home meds for now - Consider adding empaglaflozin to optimize cardioprotection    Controlled HTN: Blood pressures well controlled and stable. HF team adjusting HF medications. - Consider adding ACEI/ARB/ARNI for cardioprotective effects, but hold off until her AKI resolves   Osteoarthritis of the Knees: Chronic bilateral knee pain impacting patient mobility. Controlled at home with PRN Tylenol. Seen by PT/OT, who recommended home health PT/OT.  - Tylenol $RemoveB'650mg'JkKlcSMK$  q4 PRN - Home health PT/OT on discharge.     LOS: 5 days   Pearla Dubonnet, Medical Student 09/02/2020, 12:42 PM

## 2020-09-02 NOTE — Progress Notes (Signed)
Patient ID: Rita [name], female   DOB: 05-23-1956, 64 y.o.   MRN: [id number]     Advanced Heart Failure Rounding Note  PCP-Cardiologist: No primary care provider on file.   Subjective:    Milrinone gtt 0.25 started yesterday 9/23.   Lasix gtt increased to 20/h yesterday. Also getting metolazone 5 bid and diamox daily   UOP picked up. -1.4L Weight down 3 pounds. Co-ox 68% CVP 18   Feeling better. Able to walk hall a little. Still SOB. No orthopnea or PND.    Creatinine 2.11 => 2.23 => 2.19 => 2.23.   Right thoracentesis 9/23, 500 cc off => exudative by protein but not LDH.    Cardiac MRI:  1.  Normal LV size with EF 41%, diffuse hypokinesis. 2. Mildly dilated RV with severe systolic dysfunction, EF 32%. D-shaped septum suggests RV pressure/volume overload. 3.  Markedly dilated IVC suggestive of elevated RV filling pressure. 4. Nonspecific RV insertion site LGE, this suggests LV pressure/volume overload. 5. Moderate pleural effusion on right.    Objective:   Weight Range: 115.1 kg Body mass index is 47.95 kg/m.   Vital Signs:   Temp:  [97.5 F (36.4 C)-98.5 F (36.9 C)] 98.5 F (36.9 C) (09/25 0437) Pulse Rate:  [72-105] 77 (09/25 0437) Resp:  [18-20] 19 (09/25 0437) BP: (110-115)/(50-71) 113/50 (09/25 0437) SpO2:  [95 %-98 %] 98 % (09/25 0437) Weight:  [115.1 kg] 115.1 kg (09/25 0437) Last BM Date: 09/01/20  Weight change: Filed Weights   08/31/20 0424 09/01/20 0551 09/02/20 0437  Weight: 117.7 kg 116.1 kg 115.1 kg    Intake/Output:   Intake/Output Summary (Last 24 hours) at 09/02/2020 9924 Last data filed at 09/02/2020 0800 Gross per 24 hour  Intake 1051.87 ml  Output 2150 ml  Net -1098.13 ml      Physical Exam    General: Obese woman sitting in chair No resp difficulty HEENT: normal poor dentition Neck: supple. JVP to ear Carotids 2+ bilat; no bruits. No lymphadenopathy or thryomegaly appreciated. Cor: PMI nondisplaced. Irregular rate  & rhythm. 2/6 TR Lungs: clear Abdomen: obese soft, nontender, nondistended. No hepatosplenomegaly. No bruits or masses. Good bowel sounds. Extremities: no cyanosis, clubbing, rash, 2-3+ edema into flanks Neuro: alert & orientedx3, cranial nerves grossly intact. moves all 4 extremities w/o difficulty. Affect pleasant   Telemetry   Atrial fibrillation in 70-80s (personally reviewed)  Labs    CBC Recent Labs    09/01/20 0323 09/02/20 0303  WBC 4.1 4.1  HGB 7.4* 8.0*  HCT 24.5* 25.9*  MCV 85.7 84.4  PLT 88* 94*   Basic Metabolic Panel Recent Labs    09/01/20 1600 09/02/20 0303  NA 138 140  K 4.0 3.5  CL 91* 93*  CO2 37* 35*  GLUCOSE 169* 134*  BUN 40* 38*  CREATININE 2.26* 2.23*  CALCIUM 9.4 9.4   Liver Function Tests Recent Labs    08/31/20 1655  AST 24  ALT 16  ALKPHOS 84  BILITOT 2.1*  PROT 7.7  ALBUMIN 3.5   No results for input(s): LIPASE, AMYLASE in the last 72 hours. Cardiac Enzymes No results for input(s): CKTOTAL, CKMB, CKMBINDEX, TROPONINI in the last 72 hours.  BNP: BNP (last 3 results) Recent Labs    06/27/20 0141 07/19/20 1752 08/28/20 0455  BNP 723.7* 620.4* 673.5*    ProBNP (last 3 results) No results for input(s): PROBNP in the last 8760 hours.   D-Dimer No results for input(s): DDIMER in the last  72 hours. Hemoglobin A1C No results for input(s): HGBA1C in the last 72 hours. Fasting Lipid Panel No results for input(s): CHOL, HDL, LDLCALC, TRIG, CHOLHDL, LDLDIRECT in the last 72 hours. Thyroid Function Tests No results for input(s): TSH, T4TOTAL, T3FREE, THYROIDAB in the last 72 hours.  Invalid input(s): FREET3  Other results:   Imaging    No results found.   Medications:     Scheduled Medications: . acetaZOLAMIDE  250 mg Oral Daily  . allopurinol  100 mg Oral BID  . aspirin EC  81 mg Oral Daily  . carvedilol  3.125 mg Oral BID WC  . Chlorhexidine Gluconate Cloth  6 each Topical Daily  . enoxaparin (LOVENOX)  injection  40 mg Subcutaneous Q24H  . insulin aspart  0-15 Units Subcutaneous TID WC  . pantoprazole  40 mg Oral Daily  . polyethylene glycol  17 g Oral BID  . senna-docusate  1 tablet Oral BID  . sodium chloride flush  10-40 mL Intracatheter Q12H    Infusions: . sodium chloride Stopped (08/29/20 1823)  . furosemide (LASIX) infusion 20 mg/hr (09/02/20 0013)  . milrinone 0.25 mcg/kg/min (09/02/20 0018)    PRN Medications: sodium chloride, acetaminophen, lidocaine (PF), ondansetron (ZOFRAN) IV, sodium chloride flush   Assessment/Plan   1. Acute on chronic systolic CHF: Prominent signs of RV failure.  I reviewed 9/21 echo, EF 40-45%, global hypokinesis, D-shaped IV septum suggestive of RV pressure/volume overload, RV poorly visualized but appeared normal in size with at least mild systolic dysfunction. Cause of cardiomyopathy is uncertain.  She has history of COVID-19 1/21, cannot rule out viral myocarditis. Chemotherapy-mediated CMP is possible (not sure what chemo she had remotely for breast cancer).  Cardiac MRI showed LV EF 41% with diffuse hypokinesis, RV mildly dilated but with EF 27% and D-shaped septum, nonspecific RV insertion site LGE.  Suspect RV > LV failure. Cardiac MRI is not suggestive of amyloidosis.  We have not ruled out CAD.  She has had recurrent HF admissions this year.   Milrinone started primarily for RV support. On lasix 20/hr and metolazone and diamox.  On exam, she remains volume overloaded with CVP 20 -> 18 Now beginning to mobilize fluid on current regimen.  - Continue Lasix gtt at 20 mg/hr and continue metolazone 5 mg bid today. Follow BMET closely - Acetazolamide 250 mg daily.  - Continue milrinone 0.25 mcg/kg/min. Co-ox 68% - Continue Coreg  3.125 mg bid.  - No ARNI/spironolactone for now with elevated creatinine.  - Myeloma panel showed no M-spike. Urine IFXN pending.  - Consider RHC/LHC this admission if creatinine stabilizes.  Will need to minimize contrast.   - She is a Cardiomems candidate.  - Place UNNA boots  2. Chronic hypoxemic respiratory failure: Suspect OHS/OSA.  She was using home oxygen prior to COVID-19 PNA.  - Continue supplemental oxygen.   - Will need sleep study as outpatient.  - No change 3. Atrial fibrillation: Chronic, all ECGs since 7/21 show atrial fibrillation.  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 Rate control is reasonable.  - She cannot be cardioverted at this point as she cannot be chronically anticoagulated.  - Consider Watchman device placement, though would need 45 days warfarin and Plavix x 6 months after that.  - No change 4. GI bleeding: Recurrent, from small bowel AVMs. No overt bleeding. Hgb trending down, 7.4 today.  - Send Fe studies, ?feraheme.  - Transfuse hgb < 7.  5.  Right pleural effusion: Suspect due to CHF.  Had right thoracentesis with 500 cc off on 9/23, pleural fluid protein suggested exudative though PF LDH did not.   6. 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  7. CKD: Stage IV.  Follow creatinine closely with diuresis, - stable today 8. Thrombocytopenia: Mild, stable.     Length of Stay: [place], [place]  09/02/2020, 9:27 AM  Advanced Heart Failure Team Pager [phone] (M-F; 7a - 4p)  Please contact [facility] for night-coverage after hours (4p -7a ) and weekends on [email or web address]

## 2020-09-02 NOTE — Progress Notes (Signed)
Physical Therapy Treatment Patient Details Name: Rita [name] MRN: [id number] DOB: 1956-05-26 Today's Date: 09/02/2020    History of Present Illness 64 yo female with LE edema and SOB was noted to have CHF.  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, pulmonary HTN, persistent Afib, and diastolic HF on supplmental O2 prn    PT Comments    On arrival to room pt received in bed and agreeable to participate with therapy. Pt required assist for transfer to [facility] and subsequent peri care. She was able to progress OOB to hallway ambulation this session. Ambulation distance limited by DOE, however SpO2 remained > 90% while on 3L O2. Patient would benefit from continued skilled PT to maximize activity tolerance and functional independence. Will continue to follow acutely.     Follow Up Recommendations  Home health PT     Equipment Recommendations  None recommended by PT    Recommendations for Other Services       Precautions / Restrictions Precautions Precautions: Fall    Mobility  Bed Mobility Overal bed mobility: Needs Assistance Bed Mobility: Sidelying to Sit   Sidelying to sit: Min assist;HOB elevated       General bed mobility comments: Assist for LE progression and line management.   Transfers Overall transfer level: Needs assistance Equipment used: 4-wheeled walker Transfers: Sit to/from [facility] Sit to Stand: Min guard Stand pivot transfers: Min guard       General transfer comment: Cues for hand placement and to lock brakes with rollator. Pt pivoted from bed to [facility]  Ambulation/Gait Ambulation/Gait assistance: Min guard Gait Distance (Feet): 75 Feet Assistive device: 4-wheeled walker Gait Pattern/deviations: Step-through pattern;Decreased stride length;Trunk flexed;Wide base of support Gait velocity: decreased Gait velocity interpretation: <1.31 ft/sec, indicative of household ambulator General Gait Details: Pt  ambulated with min guard for safety. Cues for rollator proximity and safety as pt would attempt to lean arms on rollator when fatigued. Pt required 1 standing rest break due to DOE on 3L O2 and SpO2 remained >90%.    Stairs             Wheelchair Mobility    Modified Rankin (Stroke Patients Only)       Balance Overall balance assessment: Needs assistance Sitting-balance support: Feet supported Sitting balance-Leahy Scale: Fair     Standing balance support: Bilateral upper extremity supported Standing balance-Leahy Scale: Poor Standing balance comment: increased SOB noted.                             Cognition Arousal/Alertness: Awake/alert Behavior During Therapy: WFL for tasks assessed/performed Overall Cognitive Status: Within Functional Limits for tasks assessed                                 General Comments: VSS      Exercises      General Comments General comments (skin integrity, edema, etc.): Pt requiring assist with prei care after BM.      Pertinent Vitals/Pain Pain Assessment: Faces Faces Pain Scale: Hurts a little bit Pain Location: Bil Knees and IV site Pain Descriptors / Indicators: Aching;Sore Pain Intervention(s): Monitored during session;Limited activity within patient's tolerance;Repositioned    Home Living                      Prior Function  PT Goals (current goals can now be found in the care plan section) Acute Rehab PT Goals Patient Stated Goal: Get back home. PT Goal Formulation: With patient Time For Goal Achievement: 09/12/20 Potential to Achieve Goals: Fair Progress towards PT goals: Progressing toward goals    Frequency    Min 3X/week      PT Plan Current plan remains appropriate    Co-evaluation              AM-PAC PT "6 Clicks" Mobility   Outcome Measure  Help needed turning from your back to your side while in a flat bed without using bedrails?: A  Little Help needed moving from lying on your back to sitting on the side of a flat bed without using bedrails?: A Little Help needed moving to and from a bed to a chair (including a wheelchair)?: A Little Help needed standing up from a chair using your arms (e.g., wheelchair or bedside chair)?: A Little Help needed to walk in hospital room?: A Little Help needed climbing 3-5 steps with a railing? : A Little 6 Click Score: 18    End of Session Equipment Utilized During Treatment: Gait belt;Oxygen Activity Tolerance: Patient tolerated treatment well Patient left: in chair;with nursing/sitter in room;with call bell/phone within reach Nurse Communication: Mobility status PT Visit Diagnosis: Unsteadiness on feet (R26.81);Muscle weakness (generalized) (M62.81)     Time: 0370-4888 PT Time Calculation (min) (ACUTE ONLY): 48 min  Charges:  $Gait Training: 23-37 mins $Therapeutic Activity: 8-22 mins                    Benjiman Core, [place] Pager [phone] Acute Rehab  Allena Katz 09/02/2020, 10:27 AM

## 2020-09-02 NOTE — Progress Notes (Signed)
Orthopedic Tech Progress Note Patient Details:  Rita [name] [birth year] [id number]  Ortho Devices Type of Ortho Device: Haematologist Ortho Device/Splint Location: bi lateral Ortho Device/Splint Interventions: Ordered, Application, Adjustment   Post Interventions Patient Tolerated: Well Instructions Provided: Adjustment of device, Care of device   Karolee Stamps 09/02/2020, 8:58 PM

## 2020-09-03 DIAGNOSIS — N184 Chronic kidney disease, stage 4 (severe): Secondary | ICD-10-CM

## 2020-09-03 DIAGNOSIS — I509 Heart failure, unspecified: Secondary | ICD-10-CM

## 2020-09-03 DIAGNOSIS — N1832 Chronic kidney disease, stage 3b: Secondary | ICD-10-CM

## 2020-09-03 LAB — BASIC METABOLIC PANEL
Anion gap: 8 (ref 5–15)
BUN: 38 mg/dL — ABNORMAL HIGH (ref 8–23)
CO2: 39 mmol/L — ABNORMAL HIGH (ref 22–32)
Calcium: 9.2 mg/dL (ref 8.9–10.3)
Chloride: 89 mmol/L — ABNORMAL LOW (ref 98–111)
Creatinine, Ser: 2.2 mg/dL — ABNORMAL HIGH (ref 0.44–1.00)
GFR calc Af Amer: 26 mL/min — ABNORMAL LOW (ref >60)
GFR calc non Af Amer: 23 mL/min — ABNORMAL LOW (ref >60)
Glucose, Bld: 143 mg/dL — ABNORMAL HIGH (ref 70–99)
Potassium: 3.4 mmol/L — ABNORMAL LOW (ref 3.5–5.1)
Sodium: 136 mmol/L (ref 135–145)

## 2020-09-03 LAB — GLUCOSE, CAPILLARY
Glucose-Capillary: 134 mg/dL — ABNORMAL HIGH (ref 70–99)
Glucose-Capillary: 148 mg/dL — ABNORMAL HIGH (ref 70–99)
Glucose-Capillary: 181 mg/dL — ABNORMAL HIGH (ref 70–99)
Glucose-Capillary: 170 mg/dL — ABNORMAL HIGH (ref 70–99)

## 2020-09-03 LAB — CBC
HCT: 24.8 % — ABNORMAL LOW (ref 36.0–46.0)
Hemoglobin: 7.6 g/dL — ABNORMAL LOW (ref 12.0–15.0)
MCH: 25.9 pg — ABNORMAL LOW (ref 26.0–34.0)
MCHC: 30.6 g/dL (ref 30.0–36.0)
MCV: 84.4 fL (ref 80.0–100.0)
Platelets: 98 10*3/uL — ABNORMAL LOW (ref 150–400)
RBC: 2.94 MIL/uL — ABNORMAL LOW (ref 3.87–5.11)
RDW: 18.5 % — ABNORMAL HIGH (ref 11.5–15.5)
WBC: 4.1 10*3/uL (ref 4.0–10.5)
nRBC: 0 % (ref 0.0–0.2)

## 2020-09-03 LAB — COOXEMETRY PANEL
Carboxyhemoglobin: 1.9 % — ABNORMAL HIGH (ref 0.5–1.5)
Methemoglobin: 0.9 % (ref 0.0–1.5)
O2 Saturation: 71.1 %
Total hemoglobin: 8.4 g/dL — ABNORMAL LOW (ref 12.0–16.0)

## 2020-09-03 LAB — MAGNESIUM: Magnesium: 2.5 mg/dL — ABNORMAL HIGH (ref 1.7–2.4)

## 2020-09-03 MED ORDER — POTASSIUM CHLORIDE 10 MEQ/100ML IV SOLN
10.0000 meq | INTRAVENOUS | Status: AC
  Administered 2020-09-03 (×4): 10 meq via INTRAVENOUS
  Filled 2020-09-03 (×4): qty 100

## 2020-09-03 NOTE — Progress Notes (Signed)
Patient ID: Rita [name], female   DOB: [birth year], 64 y.o.   MRN: [id number]     Advanced Heart Failure Rounding Note  PCP-Cardiologist: No primary care provider on file.   Subjective:    Milrinone gtt 0.25 started 9/23.   Now on Lasix gtt at 20/h  Also getting metolazone 5 bid and diamox daily  UOP picking up briskly. Weight down 18 pounds in 2 days. Co-ox 71% CVP 18 -> 17  Feeling much better. Less SOB. Able to walk hall a bit. No orthopnea or PND. Bloating better    Creatinine 2.11 => 2.23 => 2.19 => 2.23. => 2.20. K 3.4   Right thoracentesis 9/23, 500 cc off => exudative by protein but not LDH.    Cardiac MRI:  1.  Normal LV size with EF 41%, diffuse hypokinesis. 2. Mildly dilated RV with severe systolic dysfunction, EF 45%. D-shaped septum suggests RV pressure/volume overload. 3.  Markedly dilated IVC suggestive of elevated RV filling pressure. 4. Nonspecific RV insertion site LGE, this suggests LV pressure/volume overload. 5. Moderate pleural effusion on right.    Objective:   Weight Range: 108 kg Body mass index is 44.97 kg/m.   Vital Signs:   Temp:  [97.2 F (36.2 C)-98.4 F (36.9 C)] 98.4 F (36.9 C) (09/26 0900) Pulse Rate:  [48-89] 77 (09/26 0900) Resp:  [18-23] 22 (09/26 0900) BP: (106-125)/(55-78) 122/65 (09/26 0900) SpO2:  [91 %-100 %] 94 % (09/26 0900) Weight:  [108 kg] 108 kg (09/26 0400) Last BM Date: 09/02/20  Weight change: Filed Weights   09/01/20 0551 09/02/20 0437 09/03/20 0400  Weight: 116.1 kg 115.1 kg 108 kg    Intake/Output:   Intake/Output Summary (Last 24 hours) at 09/03/2020 1244 Last data filed at 09/03/2020 1200 Gross per 24 hour  Intake 1370.82 ml  Output 2870 ml  Net -1499.18 ml      Physical Exam    General: Obese woman sitting in chair No resp difficulty HEENT: normal poor dentition Neck: supple. JVP to jaw Carotids 2+ bilat; no bruits. No lymphadenopathy or thryomegaly appreciated. Cor: PMI  nondisplaced. irregular rate & rhythm. 2/6 TR Lungs: clear Abdomen: obese soft, nontender, nondistended. No hepatosplenomegaly. No bruits or masses. Good bowel sounds. Extremities: no cyanosis, clubbing, rash, 1-2+ edema + UNNA Neuro: alert & orientedx3, cranial nerves grossly intact. moves all 4 extremities w/o difficulty. Affect pleasant  Telemetry   Atrial fibrillation in 70-80s (personally reviewed)  Labs    CBC Recent Labs    09/02/20 0303 09/03/20 0420  WBC 4.1 4.1  HGB 8.0* 7.6*  HCT 25.9* 24.8*  MCV 84.4 84.4  PLT 94* 98*   Basic Metabolic Panel Recent Labs    09/02/20 0303 09/03/20 0420 09/03/20 0638  NA 140 136  --   K 3.5 3.4*  --   CL 93* 89*  --   CO2 35* 39*  --   GLUCOSE 134* 143*  --   BUN 38* 38*  --   CREATININE 2.23* 2.20*  --   CALCIUM 9.4 9.2  --   MG  --   --  2.5*   Liver Function Tests Recent Labs    08/31/20 1655  AST 24  ALT 16  ALKPHOS 84  BILITOT 2.1*  PROT 7.7  ALBUMIN 3.5   No results for input(s): LIPASE, AMYLASE in the last 72 hours. Cardiac Enzymes No results for input(s): CKTOTAL, CKMB, CKMBINDEX, TROPONINI in the last 72 hours.  BNP: BNP (last 3 results)  Recent Labs    06/27/20 0141 07/19/20 1752 08/28/20 0455  BNP 723.7* 620.4* 673.5*    ProBNP (last 3 results) No results for input(s): PROBNP in the last 8760 hours.   D-Dimer No results for input(s): DDIMER in the last 72 hours. Hemoglobin A1C No results for input(s): HGBA1C in the last 72 hours. Fasting Lipid Panel No results for input(s): CHOL, HDL, LDLCALC, TRIG, CHOLHDL, LDLDIRECT in the last 72 hours. Thyroid Function Tests No results for input(s): TSH, T4TOTAL, T3FREE, THYROIDAB in the last 72 hours.  Invalid input(s): FREET3  Other results:   Imaging    No results found.   Medications:     Scheduled Medications:  acetaZOLAMIDE  250 mg Oral Daily   allopurinol  100 mg Oral BID   aspirin EC  81 mg Oral Daily   carvedilol  3.125  mg Oral BID WC   Chlorhexidine Gluconate Cloth  6 each Topical Daily   enoxaparin (LOVENOX) injection  40 mg Subcutaneous Q24H   insulin aspart  0-15 Units Subcutaneous TID WC   pantoprazole  40 mg Oral Daily   polyethylene glycol  17 g Oral BID   potassium chloride  40 mEq Oral BID   senna-docusate  1 tablet Oral BID   sodium chloride flush  10-40 mL Intracatheter Q12H    Infusions:  sodium chloride Stopped (08/29/20 1823)   furosemide (LASIX) infusion 20 mg/hr (09/03/20 0250)   milrinone 0.25 mcg/kg/min (09/03/20 1155)   potassium chloride 10 mEq (09/03/20 1152)    PRN Medications: sodium chloride, acetaminophen, lidocaine (PF), ondansetron (ZOFRAN) IV, sodium chloride flush   Assessment/Plan   1. Acute on chronic systolic CHF: Prominent signs of RV failure.  I reviewed 9/21 echo, EF 40-45%, global hypokinesis, D-shaped IV septum suggestive of RV pressure/volume overload, RV poorly visualized but appeared normal in size with at least mild systolic dysfunction. Cause of cardiomyopathy is uncertain.  She has history of COVID-19 1/21, cannot rule out viral myocarditis. Chemotherapy-mediated CMP is possible (not sure what chemo she had remotely for breast cancer).  Cardiac MRI showed LV EF 41% with diffuse hypokinesis, RV mildly dilated but with EF 27% and D-shaped septum, nonspecific RV insertion site LGE.  Suspect RV > LV failure. Cardiac MRI is not suggestive of amyloidosis.  We have not ruled out CAD.  She has had recurrent HF admissions this year.   Milrinone started primarily for RV support. On lasix 20/hr and metolazone and diamox.  Now diuresing briskly. Weight down nearly 20 pounds but still volume overloaded.  - Continue Lasix gtt at 20 mg/hr and continue metolazone 5 mg bid today. Follow BMET closely. Supp K - Acetazolamide 250 mg daily.  - Continue milrinone 0.25 mcg/kg/min. Co-ox 71% - Continue Coreg  3.125 mg bid.  - No ARNI/spironolactone for now with elevated  creatinine.  - Myeloma panel showed no M-spike. Urine IFXN pending.  - Consider RHC/LHC this admission if creatinine stabilizes (if not will at least need RHC).  Will need to minimize contrast.  - She is a Cardiomems candidate.  - Continue UNNA boots  2. Chronic hypoxemic respiratory failure: Suspect OHS/OSA.  She was using home oxygen prior to COVID-19 PNA.  - Continue supplemental oxygen.   - Will need sleep study as outpatient.  - No change 3. Atrial fibrillation: Chronic, all ECGs since 7/21 show atrial fibrillation.  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 Rate control is reasonable.  - She cannot be  cardioverted at this point as she cannot be chronically anticoagulated.  - Consider Watchman device placement, though would need 45 days warfarin and Plavix x 6 months after that.  - No change 4. GI bleeding: Recurrent, from small bowel AVMs. No overt bleeding. Hgb trending down, 7.4 today.  - Tsat 22% (ok) - Transfuse hgb < 7.  5. Right pleural effusion: Suspect due to CHF.  Had right thoracentesis with 500 cc off on 9/23, pleural fluid protein suggested exudative though PF LDH did not.   6. 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  7. CKD: Stage IV.  Follow creatinine closely with diuresis, - stable today at 2.20 8. Thrombocytopenia: Mild, stable.     Length of Stay: Edith Endave, MD  09/03/2020, 12:44 PM  Advanced Heart Failure Team Pager [phone] (M-F; 7a - 4p)  Please contact [facility] for night-coverage after hours (4p -7a ) and weekends on [email or web address]

## 2020-09-03 NOTE — Progress Notes (Signed)
Subjective:  Patient is doing well. Mentions that she still feel SOB sometimes when she moves but overally feels better than yesterday. No chest pain. No complaint.  Objective:  Vital signs in last 24 hours: Vitals:   09/02/20 1600 09/02/20 2009 09/03/20 0001 09/03/20 0400  BP: (!) 111/55 113/75 124/78 (!) 125/59  Pulse: 72 70 73 (!) 48  Resp: 18 18 (!) 21 (!) 23  Temp: 98 F (36.7 C) 97.9 F (36.6 C) 97.8 F (36.6 C) 98.1 F (36.7 C)  TempSrc: Oral Oral  Oral  SpO2: 96% 91% 94% 94%  Weight:    108 kg  Height:       Constitutional: Morbidly obese. No acute distress.  Head: Normocephalic and atraumatic.  Cardiovascular: Irregular, nl Y6V7, systolic murmur, JVD, CVP 17 today Respiratory: Effort normal, has decreased breath sound. Crackles improved. No respiratory distress. GI: Soft.There is no tenderness.  Neurological: Is alert and oriented x 3 Extremities: wraped Skin: Not diaphoretic. No erythema.  Psychiatric: Normal mood and affect. Behavior is normal. Judgment and thought content normal.   Assessment/Plan:  Principal Problem:   Acute exacerbation of congestive heart failure (HCC) Active Problems:   Diabetes mellitus without complication (HCC)   Persistent atrial fibrillation (HCC)   CKD (chronic kidney disease), stage III   Hypertension associated with diabetes ([place])   Morbid obesity (HCC)   Thrombocytopenia (HCC)   Anemia   Acute systolic heart failure (HCC)   Pulmonary hypertension (HCC)  Acute on Chronic Combination Systolic and Diastolic HF: Acute on Chronic Hypoxic Respiratory Failure: Pleural effusion: S/p thora 9/25.  Symptoms improved and lost 22 lb? Since admission (with diuresis. Still has some volume on exam. I/O: net -2201 yesterday. Weight change: -7.144 kg   Co ox-71%, CVP 17 today (was 18 yesterday). Uncertain etiology of CMP. 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 Cardiac MRI: Not auggestive of amyloidosis. LVEF 41% w  diffuse hypokinesia. Septal patterns suggestive of RV. LV pressure and volume overload.  Had mildly dilated IVC suggested of elevated RV filling pressure  Heart failure team, have been kindly following and managing her heart failure. Currently on following regimen per their recommendations:   -On- lasix Gtt 20 ml/h,  - On Metolazone 5mg  BID - On Acetazolamide 250mg  daily - On Coreg 3.125 mg BID - On Milrinone 0/25 mcg/kg/min  - Holding ARNI/spironolactone given elevated creatinine  - K 3.4 today replaced with IV K 40 meq in adition to PO Kdur 40 BID -Mg 2.5 -BMP daily and replace electrolytes to keep K>4 and Mg>2 -Strict I/O -Fluid restriction -Cr stable with diuresis -Appreciate HF team care  -RHC +/- LHC this admission (if renal function allows) -Weight daily  Persistent Atrial Fibrillation: Not on AC and also not a candidate for cardioversion. due to Hx of GI bleeding and multiple AVMs.  Per Dr. Haroldine Laws, may consider Watchman device placement, but she will need warfarin x 45 days and  need 45 days and Plavix x 6 months.  -Rate remains control on decreased dose of Coreg 3.25 mg BID. -Continue ASA 81mg  daily  -Telemetry -Continue Coreg 3.25 mg BID -No Ac as above  CKD 4: Cr stable w diuresis -BMP daily -Avoid nephrotoxins and contrast as possible  Uncontrolled Type II DM: Does not check blood sugar at home. Home meds are Metformin 500mg  in the morning only and glipizide 5mg  daily. A1c 7.5. Glucose under slightly better control today in 170's on moderate SSI.  - Continue moderate SSI with moderate  correction coverage - Consideradding empaglaflozin to optimize cardioprotection  CBG (last 3)  Recent Labs    09/02/20 2108 09/03/20 0632 09/03/20 1200  GLUCAP 213* 134* 148*    Controlled HTN: Blood pressures well controlled and stable. HF team adjusting HF medications. - Consider adding ACEI/ARB/ARNI for cardioprotective effects, but hold off until her AKI  resolves    LOS: 6 days   Dewayne Hatch, MD 09/03/2020, 8:36 AM

## 2020-09-04 LAB — GLUCOSE, CAPILLARY
Glucose-Capillary: 160 mg/dL — ABNORMAL HIGH (ref 70–99)
Glucose-Capillary: 155 mg/dL — ABNORMAL HIGH (ref 70–99)
Glucose-Capillary: 118 mg/dL — ABNORMAL HIGH (ref 70–99)
Glucose-Capillary: 154 mg/dL — ABNORMAL HIGH (ref 70–99)

## 2020-09-04 LAB — BASIC METABOLIC PANEL
Anion gap: 9 (ref 5–15)
BUN: 33 mg/dL — ABNORMAL HIGH (ref 8–23)
CO2: 40 mmol/L — ABNORMAL HIGH (ref 22–32)
Calcium: 8.9 mg/dL (ref 8.9–10.3)
Chloride: 87 mmol/L — ABNORMAL LOW (ref 98–111)
Creatinine, Ser: 2.22 mg/dL — ABNORMAL HIGH (ref 0.44–1.00)
GFR calc Af Amer: 26 mL/min — ABNORMAL LOW (ref >60)
GFR calc non Af Amer: 22 mL/min — ABNORMAL LOW (ref >60)
Glucose, Bld: 183 mg/dL — ABNORMAL HIGH (ref 70–99)
Potassium: 3.6 mmol/L (ref 3.5–5.1)
Sodium: 136 mmol/L (ref 135–145)

## 2020-09-04 LAB — PHOSPHORUS: Phosphorus: 4.8 mg/dL — ABNORMAL HIGH (ref 2.5–4.6)

## 2020-09-04 LAB — CBC
HCT: 25.9 % — ABNORMAL LOW (ref 36.0–46.0)
Hemoglobin: 8 g/dL — ABNORMAL LOW (ref 12.0–15.0)
MCH: 26.4 pg (ref 26.0–34.0)
MCHC: 30.9 g/dL (ref 30.0–36.0)
MCV: 85.5 fL (ref 80.0–100.0)
Platelets: 101 10*3/uL — ABNORMAL LOW (ref 150–400)
RBC: 3.03 MIL/uL — ABNORMAL LOW (ref 3.87–5.11)
RDW: 18.2 % — ABNORMAL HIGH (ref 11.5–15.5)
WBC: 4.1 10*3/uL (ref 4.0–10.5)
nRBC: 0 % (ref 0.0–0.2)

## 2020-09-04 LAB — MAGNESIUM: Magnesium: 2.4 mg/dL (ref 1.7–2.4)

## 2020-09-04 LAB — PH, BODY FLUID: pH, Body Fluid: 7.6

## 2020-09-04 LAB — COOXEMETRY PANEL
Carboxyhemoglobin: 2.4 % — ABNORMAL HIGH (ref 0.5–1.5)
Methemoglobin: 1.7 % — ABNORMAL HIGH (ref 0.0–1.5)
O2 Saturation: 72.5 %
Total hemoglobin: 10.3 g/dL — ABNORMAL LOW (ref 12.0–16.0)

## 2020-09-04 MED ORDER — METOLAZONE 5 MG PO TABS
5.0000 mg | ORAL_TABLET | Freq: Two times a day (BID) | ORAL | Status: DC
  Administered 2020-09-04 – 2020-09-06 (×5): 5 mg via ORAL
  Filled 2020-09-04 (×5): qty 1

## 2020-09-04 MED ORDER — POTASSIUM CHLORIDE CRYS ER 20 MEQ PO TBCR
40.0000 meq | EXTENDED_RELEASE_TABLET | Freq: Three times a day (TID) | ORAL | Status: DC
  Administered 2020-09-04 – 2020-09-06 (×7): 40 meq via ORAL
  Filled 2020-09-04 (×7): qty 2

## 2020-09-04 MED ORDER — ACETAZOLAMIDE 250 MG PO TABS
250.0000 mg | ORAL_TABLET | Freq: Two times a day (BID) | ORAL | Status: DC
  Administered 2020-09-04 – 2020-09-07 (×8): 250 mg via ORAL
  Filled 2020-09-04 (×8): qty 1

## 2020-09-04 NOTE — Progress Notes (Addendum)
Patient ID: Rita [name], female   DOB: 07/08/1956, 64 y.o.   MRN: [id number]     Advanced Heart Failure Rounding Note  PCP-Cardiologist: No primary care provider on file.   Subjective:    Milrinone gtt 0.25 started 9/23.   Now on Lasix gtt at 20/h  Also getting metolazone 5 bid and diamox daily.  -3.3L in UOP yesterday. CVP still elevated at 19-20.   Co-ox 73%.   Creatinine has stabilized ~2.2. BUN trending down, 38>>33. Clear urine in purewick canister.  Overall, she is feeling much better.    Right thoracentesis 9/23, 500 cc off => exudative by protein but not LDH.    Cardiac MRI:  1.  Normal LV size with EF 41%, diffuse hypokinesis. 2. Mildly dilated RV with severe systolic dysfunction, EF 63%. D-shaped septum suggests RV pressure/volume overload. 3.  Markedly dilated IVC suggestive of elevated RV filling pressure. 4. Nonspecific RV insertion site LGE, this suggests LV pressure/volume overload. 5. Moderate pleural effusion on right.    Objective:   Weight Range: 110.9 kg Body mass index is 46.22 kg/m.   Vital Signs:   Temp:  [97.6 F (36.4 C)-98.4 F (36.9 C)] 97.6 F (36.4 C) (09/26 2350) Pulse Rate:  [77-87] 79 (09/26 2350) Resp:  [19-22] 20 (09/26 2350) BP: (114-127)/(65-74) 114/71 (09/26 2350) SpO2:  [94 %-97 %] 97 % (09/26 2350) Weight:  [110.9 kg] 110.9 kg (09/27 0430) Last BM Date: 09/03/20  Weight change: Filed Weights   09/02/20 0437 09/03/20 0400 09/04/20 0430  Weight: 115.1 kg 108 kg 110.9 kg    Intake/Output:   Intake/Output Summary (Last 24 hours) at 09/04/2020 0712 Last data filed at 09/04/2020 0433 Gross per 24 hour  Intake 1970.66 ml  Output 3250 ml  Net -1279.34 ml      Physical Exam    CVP 19-20  General:  Obese female. No respiratory difficulty HEENT: normal Neck: supple. JVD elevated to ear. Carotids 2+ bilat; no bruits. No lymphadenopathy or thyromegaly appreciated. Cor: PMI nondisplaced. Irregularly irregular  rhythm. No rubs, gallops or murmurs. Lungs: decreased BS at the bases bilaterally  Abdomen: obese, soft, nontender, nondistended. No hepatosplenomegaly. No bruits or masses. Good bowel sounds. Extremities: no cyanosis, clubbing, rash, 1+ bilateral LE edema, + bilateral unna boots  Neuro: alert & oriented x 3, cranial nerves grossly intact. moves all 4 extremities w/o difficulty. Affect pleasant.  Telemetry   Atrial fibrillation, 70s-80s (personally reviewed)  Labs    CBC Recent Labs    09/03/20 0420 09/04/20 0500  WBC 4.1 4.1  HGB 7.6* 8.0*  HCT 24.8* 25.9*  MCV 84.4 85.5  PLT 98* 846*   Basic Metabolic Panel Recent Labs    09/03/20 0420 09/03/20 0638 09/04/20 0500  NA 136  --  136  K 3.4*  --  3.6  CL 89*  --  87*  CO2 39*  --  40*  GLUCOSE 143*  --  183*  BUN 38*  --  33*  CREATININE 2.20*  --  2.22*  CALCIUM 9.2  --  8.9  MG  --  2.5* 2.4  PHOS  --   --  4.8*   Liver Function Tests No results for input(s): AST, ALT, ALKPHOS, BILITOT, PROT, ALBUMIN in the last 72 hours. No results for input(s): LIPASE, AMYLASE in the last 72 hours. Cardiac Enzymes No results for input(s): CKTOTAL, CKMB, CKMBINDEX, TROPONINI in the last 72 hours.  BNP: BNP (last 3 results) Recent Labs  06/27/20 0141 07/19/20 1752 08/28/20 0455  BNP 723.7* 620.4* 673.5*    ProBNP (last 3 results) No results for input(s): PROBNP in the last 8760 hours.   D-Dimer No results for input(s): DDIMER in the last 72 hours. Hemoglobin A1C No results for input(s): HGBA1C in the last 72 hours. Fasting Lipid Panel No results for input(s): CHOL, HDL, LDLCALC, TRIG, CHOLHDL, LDLDIRECT in the last 72 hours. Thyroid Function Tests No results for input(s): TSH, T4TOTAL, T3FREE, THYROIDAB in the last 72 hours.  Invalid input(s): FREET3  Other results:   Imaging    No results found.   Medications:     Scheduled Medications: . acetaZOLAMIDE  250 mg Oral Daily  . allopurinol  100 mg  Oral BID  . aspirin EC  81 mg Oral Daily  . carvedilol  3.125 mg Oral BID WC  . Chlorhexidine Gluconate Cloth  6 each Topical Daily  . enoxaparin (LOVENOX) injection  40 mg Subcutaneous Q24H  . insulin aspart  0-15 Units Subcutaneous TID WC  . pantoprazole  40 mg Oral Daily  . polyethylene glycol  17 g Oral BID  . potassium chloride  40 mEq Oral TID  . senna-docusate  1 tablet Oral BID  . sodium chloride flush  10-40 mL Intracatheter Q12H    Infusions: . sodium chloride Stopped (08/29/20 1823)  . furosemide (LASIX) infusion 20 mg/hr (09/03/20 1807)  . milrinone 0.25 mcg/kg/min (09/03/20 2346)    PRN Medications: sodium chloride, acetaminophen, lidocaine (PF), ondansetron (ZOFRAN) IV, sodium chloride flush   Assessment/Plan   1. Acute on chronic systolic CHF: Prominent signs of RV failure.  I reviewed 9/21 echo, EF 40-45%, global hypokinesis, D-shaped IV septum suggestive of RV pressure/volume overload, RV poorly visualized but appeared normal in size with at least mild systolic dysfunction. Cause of cardiomyopathy is uncertain.  She has history of COVID-19 1/21, cannot rule out viral myocarditis. Chemotherapy-mediated CMP is possible (not sure what chemo she had remotely for breast cancer).  Cardiac MRI showed LV EF 41% with diffuse hypokinesis, RV mildly dilated but with EF 27% and D-shaped septum, nonspecific RV insertion site LGE.  Suspect RV > LV failure. Cardiac MRI is not suggestive of amyloidosis.  We have not ruled out CAD.  She has had recurrent HF admissions this year.   Milrinone started primarily for RV support. On lasix 20/hr and metolazone and diamox.  Now diuresing briskly. Weight down 16 lb but still volume overloaded.  - Continue Lasix gtt at 20 mg/hr and continue metolazone 5 mg bid today. Follow BMET closely. Supp K - Continue Acetazolamide, ? Increase to 250 mg bid.  - Continue milrinone 0.25 mcg/kg/min. Co-ox 73% - Continue Coreg  3.125 mg bid.  - No  ARNI/spironolactone for now with elevated creatinine.  - Myeloma panel showed no M-spike. Urine IFXN pending.  - Consider RHC/LHC this admission if creatinine stabilizes (if not will at least need RHC).  Will need to minimize contrast.  - She is a Cardiomems candidate.  - Continue UNNA boots  2. Chronic hypoxemic respiratory failure: Suspect OHS/OSA.  She was using home oxygen prior to COVID-19 PNA.  - Continue supplemental oxygen.   - Will need sleep study as outpatient.  - No change 3. Atrial fibrillation: Chronic, all ECGs since 7/21 show atrial fibrillation.  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 Rate control is reasonable.  - She cannot be cardioverted at this point as  she cannot be chronically anticoagulated.  - Consider Watchman device placement, though would need 45 days warfarin and Plavix x 6 months after that.  - No change 4. GI bleeding: Recurrent, from small bowel AVMs. No overt bleeding. Hgb 8.0 today.  - Tsat 22% (ok) - Transfuse hgb < 7.  5. Right pleural effusion: Suspect due to CHF.  Had right thoracentesis with 500 cc off on 9/23, pleural fluid protein suggested exudative though PF LDH did not.   6. 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  7. CKD: Stage IV.  Follow creatinine closely with diuresis, - stable today at 2.22 8. Thrombocytopenia: Mild, stable.     Length of Stay: [address], PA-C  09/04/2020, 7:12 AM  Advanced Heart Failure Team Pager [phone] (M-F; [place])  Please contact [facility] for night-coverage after hours (4p -7a ) and weekends on [email or web address]  Patient seen with PA, agree with the above note.   She has been diuresing better, I/Os negative 1279 cc yesterday and weight trending down (think yesterday's weight was not accurate).  CVP still elevated 19-20.  Co-ox 73%.   General: NAD Neck: JVP 16 cm, no thyromegaly or thyroid nodule.  Lungs:  Decreased right base.  CV: Nondisplaced PMI.  Heart irregular S1/S2, no S3/S4, 2/6 SEM RUSB.  1+ chronic edema to knees.   Abdomen: Soft, nontender, no hepatosplenomegaly, no distention.  Skin: Intact without lesions or rashes.  Neurologic: Alert and oriented x 3.  Psych: Normal affect. Extremities: No clubbing or cyanosis.  HEENT: Normal.   Doing better, starting to diurese but on very high dose diuretics at this point.  Renal function remains stable. CVP still high.  - Continue current diuretics except will increase acetazolamide to 250 mg bid.  - Continue milrinone 0.25 primarily for RV support.  - RHC in future, will check coronaries if creatinine comes down.   Atrial fibrillation is rate-controlled and chronic.  Not anticoagulated so no DCCV.  May be Watchman candidate.   Ambulate in hall.   Loralie Champagne 09/04/2020 8:20 AM

## 2020-09-04 NOTE — Progress Notes (Signed)
Physical Therapy Treatment Patient Details Name: Rita [name] MRN: [id number] DOB: 12/28/1955 Today's Date: 09/04/2020    History of Present Illness 64 yo female with LE edema and SOB was noted to have CHF.  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, pulmonary HTN, persistent Afib, and diastolic HF on supplmental O2 prn    PT Comments    Pt limited during session requesting bed exercises; pt performed HEP with rest between sets of 10; pt given HEP handout and educated on performing 2-3 x a day to increase strength; pt agreeable; pt rated HEP 5/10 on modified RPE scale; pt continues to demonstrate deficits in strength, endurance, gait and balance; pt will benefit from skilled PT to address deficits to maximize independence with functional mobility prior to discharge.     Follow Up Recommendations  Home health PT     Equipment Recommendations  None recommended by PT    Recommendations for Other Services       Precautions / Restrictions Precautions Precautions: Fall Restrictions Weight Bearing Restrictions: No    Mobility  Bed Mobility                  Transfers                    Ambulation/Gait                 Stairs             Wheelchair Mobility    Modified Rankin (Stroke Patients Only)       Balance                                            Cognition Arousal/Alertness: Awake/alert Behavior During Therapy: WFL for tasks assessed/performed Overall Cognitive Status: Within Functional Limits for tasks assessed                                 General Comments: pt in A-fib throughout session      Exercises General Exercises - Lower Extremity Ankle Circles/Pumps: AROM;Both;20 reps;Supine Quad Sets: AROM;Both;20 reps;Supine Gluteal Sets: AROM;Both;20 reps;Supine Short Arc Quad: AROM;Both;20 reps;Supine Heel Slides: AROM;Both;20 reps;Supine Hip ABduction/ADduction:  AROM;Both;20 reps;Supine    General Comments General comments (skin integrity, edema, etc.): pt requesting to do exericses; pt fatigued; pt educated on exercises with HEP handout given      Pertinent Vitals/Pain Pain Assessment: No/denies pain    Home Living                      Prior Function            PT Goals (current goals can now be found in the care plan section) Acute Rehab PT Goals Patient Stated Goal: Get back home. PT Goal Formulation: With patient Time For Goal Achievement: 09/12/20 Potential to Achieve Goals: Fair Progress towards PT goals: Progressing toward goals (pt requesting exercises due to fatigue)    Frequency    Min 3X/week      PT Plan Current plan remains appropriate    Co-evaluation              AM-PAC PT "6 Clicks" Mobility   Outcome Measure  Help needed turning from your back to your side while in a  flat bed without using bedrails?: A Little Help needed moving from lying on your back to sitting on the side of a flat bed without using bedrails?: A Little Help needed moving to and from a bed to a chair (including a wheelchair)?: A Little Help needed standing up from a chair using your arms (e.g., wheelchair or bedside chair)?: A Little Help needed to walk in hospital room?: A Little Help needed climbing 3-5 steps with a railing? : A Little 6 Click Score: 18    End of Session Equipment Utilized During Treatment: Oxygen Activity Tolerance: Patient tolerated treatment well;Patient limited by fatigue Patient left: in chair;with call bell/phone within reach Nurse Communication: Mobility status PT Visit Diagnosis: Unsteadiness on feet (R26.81);Muscle weakness (generalized) (M62.81)     Time: 9292-4462 PT Time Calculation (min) (ACUTE ONLY): 32 min  Charges:  $Therapeutic Exercise: 23-37 mins                     Lyanne Co, DPT Acute Rehabilitation Services [phone]   [place] 09/04/2020, 11:11 AM

## 2020-09-04 NOTE — Progress Notes (Signed)
Patient identified as having high risk for 12 month mortality. Chart reviewed for potential palliative care needs or unidentified gaps in care.  Rita [name] has had 4 admissions in the last 3 months for NSTEM, CHF and Chrin[place] Respiratory Failure. She is followed in the [facility] system for primary care (Dr. Bea Graff) and cardiology (Dr. Beatrix Fetters). She is recently seen by [facility] [place] at home but she has not been out of the hospital long enough for them to get her on a home care program. She has been failing at home in terms of her functional status, medication adherence/management and was started on meals on wheels recently due to nutrition issues and inadequate resources. Her husband is her main caregiver/support.   Rita [name] has no advance care planning documented in her medical record or goals of care conversations in the event she is unable to speak for herself or has even further decline in her health status. She is currently listed as full code for this admission.  In addition to her severe right heart failure she has a persistent lung infiltrate?mass/lung in the right middle lobe with changes in lung parenchyma -recurrent symptomatic large pleural effusion . She has had thoracentesis again this admission, previous thoracentesis on 7/21. 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  Now on Milrinone, aggressive diuresis. She is improving symptomatically. Long term prognosis is likely poor with progressive lung disease and right heart failure.She will need high levels of support at home following discharge to stay out of the hospital and to manage her cardiopulmonary disease.  Recommend primary team palliative care discussion for ACP/goals of care and expectations for illness trajectory including associated mortality, progression of lung and heart disease as well as the impact on QOL and challenges  of management at home and provide support. If there is difficulty establishing goals of care, her condition deteriorates or there are ongoing symptom management challenges please consider inpatient palliative care consultation. She is followed by [facility] at home who can provide both disease management and palliative support after discharge.  Lane Hacker, DO Palliative Medicine [phone]

## 2020-09-04 NOTE — Progress Notes (Signed)
Subjective:  Ms. Dunigan reports that she is feeling "pretty good" this morning. Her breathing continues to improve with diuresis. Her urine output has remained very good on the current diuretic regimen, though it seems she has not received her metolazone for several days. She denies any abdominal pain at this time and had a normal bowel movement yesterday.  We discussed the HF team's plan for a RHC sometime in the future. She is amenable to this. She has no other concerns at this time.   Objective:  Vital signs in last 24 hours: Vitals:   09/04/20 0430 09/04/20 0747 09/04/20 0800 09/04/20 1137  BP:   (!) 111/58   Pulse:      Resp:      Temp:  98.6 F (37 C)  98.5 F (36.9 C)  TempSrc:  Oral  Oral  SpO2:   95%   Weight: 110.9 kg     Height:       Weight change: 2.994 kg  Intake/Output Summary (Last 24 hours) at 09/04/2020 1140 Last data filed at 09/04/2020 1031 Gross per 24 hour  Intake 1366.87 ml  Output 3930 ml  Net -2563.13 ml   Physical Exam Vitals and nursing note reviewed.  Constitutional:      General: She is not in acute distress.    Interventions: Nasal cannula in place.     Comments: 2L nasal cannula  Cardiovascular:     Rate and Rhythm: Rhythm irregularly irregular.     Heart sounds: Murmur heard.  Systolic (diffuse) murmur is present with a grade of 2/6.      Comments: Unna boots in place. Unable to assess for LE edema.  Pulmonary:     Effort: Pulmonary effort is normal. No respiratory distress.  Neurological:     Mental Status: She is alert.     Assessment/Plan:  Principal Problem:   Acute exacerbation of congestive heart failure (HCC) Active Problems:   Diabetes mellitus without complication (HCC)   Persistent atrial fibrillation (HCC)   CKD (chronic kidney disease), stage III   Hypertension associated with diabetes ([place])   Morbid obesity (HCC)   Thrombocytopenia (HCC)   Anemia   Acute systolic heart failure (HCC)   Pulmonary hypertension  (HCC)  Acute on Chronic Combination Systolic and Diastolic HF: Acute on Chronic Hypoxic Respiratory Failure: Pleural effusion: S/p thora 9/25.  Symptoms improved and weight down ~16 lb since admission with diuresis. Still has some volume on exam. I/O: net -1279 yesterday. Recorded weight is up from yesterday, but question whether yesterday's weight was accurate vs inaccurate I/O.  Weight change: 2.994 kg    Co ox-72.5%, stable from yesterday, CVP 19-20 today (was 17 yesterday). Uncertain etiology of CMP. 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 Cardiac MRI: Not auggestive of amyloidosis. LVEF 41% w diffuse hypokinesia. Septal patterns suggestive of RV. LV pressure and volume overload.  Had mildly dilated IVC suggestive of elevated RV filling pressure  Heart failure team have been kindly following and managing her heart failure. Currently on following regimen per their recommendations:  - Lasix Gtt 20 mg/h,  - Metolazone 5mg  BID  - Has not received this for 2 days, will restart today - Acetazolamide 250mg  daily - Coreg 3.125 mg BID - Milrinone 0/25 mcg/kg/min  - Holding ARNI/spironolactone given elevated creatinine  - K 3.6. Increased PO Kdur 40mg  to TID - Mg 2.4 - BMP daily and replace electrolytes to keep K>4 and Mg>2 - Strict I/O - Fluid restriction - Cr  stable with diuresis - Appreciate HF team care  - RHC +/- LHC this admission (if renal function allows) - Weight daily  Persistent Atrial Fibrillation: Not on AC and also not a candidate for cardioversion. due to Hx of GI bleeding and multiple AVMs.  Per HF team, may consider Watchman device placement, but she will need warfarin x 45 days and  need 45 days and Plavix x 6 months. - Rate remains control on decreased dose of Coreg 3.25 mg BID. - Continue ASA 81mg  daily  - Telemetry - Continue Coreg 3.25 mg BID  CKD 4: Cr stable w diuresis - BMP daily - Avoid nephrotoxins and contrast as  possible  Uncontrolled Type II DM: Does not check blood sugar at home. Home meds are Metformin 500mg  in the morning only and glipizide 5mg  daily. A1c 7.5.Glucose under slightly better control today in 170's on moderate SSI. - Continue moderate SSI with moderate correction coverage - Consideradding empaglaflozin to optimize cardioprotection CBG (last 3)  Recent Labs    09/03/20 1716 09/03/20 2112 09/04/20 0604  GLUCAP 181* 170* 160*    Controlled HTN: Blood pressures well controlled and stable. HF team adjusting HF medications. - Consider adding ACEI/ARB/ARNI for cardioprotective effects, but hold off until her AKI resolves    LOS: 7 days   Pearla Dubonnet, Medical Student 09/04/2020, 11:40 AM

## 2020-09-05 LAB — GLUCOSE, CAPILLARY
Glucose-Capillary: 158 mg/dL — ABNORMAL HIGH (ref 70–99)
Glucose-Capillary: 125 mg/dL — ABNORMAL HIGH (ref 70–99)
Glucose-Capillary: 170 mg/dL — ABNORMAL HIGH (ref 70–99)
Glucose-Capillary: 164 mg/dL — ABNORMAL HIGH (ref 70–99)

## 2020-09-05 LAB — CBC
HCT: 26.7 % — ABNORMAL LOW (ref 36.0–46.0)
Hemoglobin: 8.1 g/dL — ABNORMAL LOW (ref 12.0–15.0)
MCH: 26.5 pg (ref 26.0–34.0)
MCHC: 30.3 g/dL (ref 30.0–36.0)
MCV: 87.3 fL (ref 80.0–100.0)
Platelets: 102 10*3/uL — ABNORMAL LOW (ref 150–400)
RBC: 3.06 MIL/uL — ABNORMAL LOW (ref 3.87–5.11)
RDW: 18.9 % — ABNORMAL HIGH (ref 11.5–15.5)
WBC: 3.8 10*3/uL — ABNORMAL LOW (ref 4.0–10.5)
nRBC: 0 % (ref 0.0–0.2)

## 2020-09-05 LAB — BASIC METABOLIC PANEL
Anion gap: 11 (ref 5–15)
BUN: 35 mg/dL — ABNORMAL HIGH (ref 8–23)
CO2: 42 mmol/L — ABNORMAL HIGH (ref 22–32)
Calcium: 9.4 mg/dL (ref 8.9–10.3)
Chloride: 83 mmol/L — ABNORMAL LOW (ref 98–111)
Creatinine, Ser: 2.21 mg/dL — ABNORMAL HIGH (ref 0.44–1.00)
GFR calc Af Amer: 26 mL/min — ABNORMAL LOW (ref >60)
GFR calc non Af Amer: 22 mL/min — ABNORMAL LOW (ref >60)
Glucose, Bld: 175 mg/dL — ABNORMAL HIGH (ref 70–99)
Potassium: 4 mmol/L (ref 3.5–5.1)
Sodium: 136 mmol/L (ref 135–145)

## 2020-09-05 LAB — COOXEMETRY PANEL
Carboxyhemoglobin: 2.7 % — ABNORMAL HIGH (ref 0.5–1.5)
Methemoglobin: 1.6 % — ABNORMAL HIGH (ref 0.0–1.5)
O2 Saturation: 81.3 %
Total hemoglobin: 7.5 g/dL — ABNORMAL LOW (ref 12.0–16.0)

## 2020-09-05 LAB — CYTOLOGY - NON PAP

## 2020-09-05 NOTE — Progress Notes (Signed)
   09/05/20 0000  Assess: MEWS Score  Temp 97.6 F (36.4 C)  BP (!) 118/56  Pulse Rate (!) 58  ECG Heart Rate 78  Resp 19  SpO2 95 %  O2 Device Nasal Cannula  O2 Flow Rate (L/min) 3 L/min  Assess: MEWS Score  MEWS Temp 0  MEWS Systolic 0  MEWS Pulse 0  MEWS RR 0  MEWS LOC 0  MEWS Score 0  MEWS Score Color Green  Assess: if the MEWS score is Yellow or Red  Were vital signs taken at a resting state? Yes  Focused Assessment No change from prior assessment  Early Detection of Sepsis Score *See Row Information* Low  MEWS guidelines implemented *See Row Information* No, other (Comment) (mischarted respirations)  Document  Patient Outcome Other (Comment) (no intervention necessary)  Progress note created (see row info) Yes  Yellow MEWS due to mischarted respirations. No interventions necessary at this time.

## 2020-09-05 NOTE — Progress Notes (Signed)
Physical Therapy Treatment Patient Details Name: Rita [name] MRN: [id number] DOB: [birth year] Today's Date: 09/05/2020    History of Present Illness 64 yo female with LE edema and SOB was noted to have CHF.  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, pulmonary HTN, persistent Afib, and diastolic HF on supplmental O2 prn    PT Comments    Pt with improved tolerance for therapy with OOB activity; second person needed for line management; pt requiring min guard for safety; pt fatigued following short ambulation and seated exercises; pt demonstrating ability to maintain sitting EOB x 10 minutes without UE support for hygiene as well as line management prior to ambulation; pt demonstrating deficits in strength, coordination, balance, endurance and gait and will benefit from continued skilled PT to address deficits and maximize independence with functional mobility prior to discharge    Follow Up Recommendations  Home health PT     Equipment Recommendations  None recommended by PT    Recommendations for Other Services       Precautions / Restrictions Precautions Precautions: Fall Restrictions Weight Bearing Restrictions: No    Mobility  Bed Mobility Overal bed mobility: Needs Assistance Bed Mobility: Supine to Sit     Supine to sit: Supervision;HOB elevated        Transfers Overall transfer level: Needs assistance Equipment used: 4-wheeled walker Transfers: Sit to/from [facility] Sit to Stand: Min guard Stand pivot transfers: Min guard;+2 safety/equipment       General transfer comment: cueing needed for hand placement; +2 needed for line management, therapist providing min guard  Ambulation/Gait Ambulation/Gait assistance: Min guard;+2 physical assistance Gait Distance (Feet): 12 Feet Assistive device: Rolling walker (2 wheeled) Gait Pattern/deviations: Step-through pattern;Decreased stride length;Trunk flexed;Wide base of  support Gait velocity: decreased   General Gait Details: +2 neede for line management   Stairs             Wheelchair Mobility    Modified Rankin (Stroke Patients Only)       Balance Overall balance assessment: Needs assistance Sitting-balance support: Feet supported Sitting balance-Leahy Scale: Fair     Standing balance support: Bilateral upper extremity supported Standing balance-Leahy Scale: Poor                              Cognition Arousal/Alertness: Awake/alert Behavior During Therapy: WFL for tasks assessed/performed Overall Cognitive Status: Within Functional Limits for tasks assessed                                        Exercises General Exercises - Lower Extremity Ankle Circles/Pumps: AROM;Both;20 reps;Seated Long Arc Quad: AROM;Both;20 reps;Seated Hip Flexion/Marching: AROM;Both;20 reps;Seated    General Comments        Pertinent Vitals/Pain Pain Assessment: Faces Faces Pain Scale: Hurts a little bit Pain Location: Bil Knees Pain Descriptors / Indicators: Aching;Sore    Home Living                      Prior Function            PT Goals (current goals can now be found in the care plan section) Acute Rehab PT Goals Patient Stated Goal: Get back home. PT Goal Formulation: With patient Time For Goal Achievement: 09/12/20 Potential to Achieve Goals: Good Progress towards PT  goals: Progressing toward goals    Frequency    Min 3X/week      PT Plan Current plan remains appropriate    Co-evaluation              AM-PAC PT "6 Clicks" Mobility   Outcome Measure  Help needed turning from your back to your side while in a flat bed without using bedrails?: A Little Help needed moving from lying on your back to sitting on the side of a flat bed without using bedrails?: A Little Help needed moving to and from a bed to a chair (including a wheelchair)?: A Little Help needed standing up from a  chair using your arms (e.g., wheelchair or bedside chair)?: A Little Help needed to walk in hospital room?: A Little Help needed climbing 3-5 steps with a railing? : A Lot 6 Click Score: 17    End of Session Equipment Utilized During Treatment: Oxygen;Gait belt Activity Tolerance: Patient tolerated treatment well;Patient limited by fatigue Patient left: in chair;with call bell/phone within reach;with family/visitor present Nurse Communication: Mobility status PT Visit Diagnosis: Unsteadiness on feet (R26.81);Muscle weakness (generalized) (M62.81)     Time: 1339-1410 PT Time Calculation (min) (ACUTE ONLY): 31 min  Charges:  $Therapeutic Exercise: 8-22 mins $Therapeutic Activity: 8-22 mins                     Lyanne Co, DPT Acute Rehabilitation Services [phone]   [place] 09/05/2020, 2:21 PM

## 2020-09-05 NOTE — Progress Notes (Signed)
Per internal medicine, hold metolazone until evaluated by heart failure team.

## 2020-09-05 NOTE — Progress Notes (Signed)
PT Cancellation Note  Patient Details Name: Rita [name] MRN: [id number] DOB: 22-Mar-1956   Cancelled Treatment:     pt asleep and when attempted to arouse, pt unable to around and converse with therapist, will attempt in PM time permitting  Lyanne Co, DPT Acute Rehabilitation Services [phone]   Kendrick Ranch 09/05/2020, 1:12 PM

## 2020-09-05 NOTE — Progress Notes (Signed)
Subjective:  Ms. Knowles reports that she is doing well this morning. Her breathing is unchanged from previous days, but she feels her LE edema is improved. She is able to eat and drink without issue and reports that her bowel movements are normal. We discussed the heart failure team's recommended plan for at least one more day of aggressive IV diuresis followed by a transition to PO and a RHC +/- LHC. She is amenable to the plan. Denies abdominal pain, CP, or headache.   Goals of care were discussed with the patient and her husband. They agree that a common goal is keeping her out of the hospital and maximizing time together at home. Her husband feels that he is capable of providing care and assistance with ADLs in the home. They understand the extent of her illness and recognize that she would likely have poor outcomes with aggressive interventions or resuscitation efforts. They plan to discuss code status among themselves and will revisit this with [place] in a day or two.   Objective:  Vital signs in last 24 hours: Vitals:   09/05/20 0112 09/05/20 0519 09/05/20 0801 09/05/20 1136  BP: (!) 101/52 (!) 103/43 (!) 105/59 116/60  Pulse: 73 92 84 67  Resp: 20 19 18 18   Temp: 98.5 F (36.9 C) 98.9 F (37.2 C) 98.5 F (36.9 C) 98.2 F (36.8 C)  TempSrc: Oral Oral Oral Oral  SpO2: 99% 98% 97% 98%  Weight: 110 kg     Height:       Weight change: -0.953 kg  Intake/Output Summary (Last 24 hours) at 09/05/2020 1325 Last data filed at 09/05/2020 1572 Gross per 24 hour  Intake 883.85 ml  Output 2201 ml  Net -1317.15 ml   Physical Exam Vitals and nursing note reviewed.  Constitutional:      General: She is not in acute distress.    Interventions: Nasal cannula in place.     Comments: 2L nasal cannula  Cardiovascular:     Heart sounds: Murmur (unchanged from previous exams) heard.  Systolic murmur is present with a grade of 2/6.      Comments: JVP remains elevated, but improved from  previous days.  Pulmonary:     Effort: Pulmonary effort is normal. No respiratory distress.     Breath sounds: Normal breath sounds.  Abdominal:     General: Bowel sounds are normal.     Tenderness: There is no abdominal tenderness.  Neurological:     Mental Status: She is alert.       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 Symptoms improved and weight down ~18 lb since admission with diuresis. Still has some volume on exam, but her JVD is improved today compared to previous. I/O: net -2259 yesterday.  Weight change: -0.953 kg  Co ox 81.3%, improved from 72.5% yesterday, CVP 13-14 today (20 yesterday). Uncertain etiology of CMP. 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 Cardiac MRI: Not auggestive of amyloidosis.LVEF 41% w diffuse hypokinesia. Septal patterns suggestive of RV.LV pressure and volume  overload.Had mildly dilated IVC suggestive of elevated RV filling pressure  Heart failure team have been kindly following and managing her heart failure. Recommending at least one more day of aggressive IV diuresis with a goal of CVP <10, after that will transition to PO and hopeful for RHC +/- LHC on Thursday: - Lasix Gtt 20 mg/h,  - Metolazone 5mg  BID - Acetazolamide 250mg  BID - Coreg 3.125 mg BID - Milrinone 0/25 mcg/kg/min  - HoldingARNI/spironolactone given elevated creatinine   - BMP daily and replace electrolytes to keep K>4 and Mg>2  - K 4.0 today  - Continue  Klor-Con 40 mEq TID - Strict I/O - Fluid restriction - Cr stable with diuresis - Appreciate HF team care - RHC+/-LHC, hopeful for Thurs - Weight daily  Goals of Care: Patient and her husband have a good understanding of her illness and have a common goal of keeping her out of the hospital and maximizing time together at home even if it means forgoing aggressive interventions.  - Will revisit code status in a day or two  Persistent Atrial Fibrillation: Not on [facility] and also not a candidate for cardioversion. due to Hx of GI bleeding and multiple AVMs.  Per HF team, may considerWatchman device placement, but she will need warfarin x 45 days andneed 45 days and Plavix x 6 months. - Rate remains control on decreased dose of Coreg 3.25 mg BID. - Continue ASA 81mg  daily  - Telemetry - Continue Coreg 3.25 mg BID  CKD 4: Cr stable w diuresis - BMP daily - Avoid nephrotoxins and contrast as possible  Uncontrolled Type II DM: Does not check blood sugar at home. Home meds are Metformin 500mg  in the morning only and glipizide 5mg  daily. A1c 7.5.Glucose better controlled today in 150's on moderate SSI. - ContinuemoderateSSI with moderate correction coverage - Consideradding empaglaflozin to optimize cardioprotection CBG (last 3)  Recent Labs    09/04/20 2121 09/05/20 0616 09/05/20 1135  GLUCAP 154* 158* 125*   Controlled HTN: Blood pressures well controlled and stable. HF team adjusting HF medications. - Consider adding ACEI/ARB/ARNI for cardioprotective effects, but hold off until her AKI resolves    LOS: 8 days   Pearla Dubonnet, Medical Student 09/05/2020, 1:25 PM

## 2020-09-05 NOTE — Progress Notes (Addendum)
Patient ID: Rita [name], female   DOB: [birth year], 64 y.o.   MRN: [id number]     Advanced Heart Failure Rounding Note  PCP-Cardiologist: No primary care provider on file.   Subjective:    Milrinone gtt 0.25 started 9/23.   Now on Lasix gtt at 20/h  Also getting metolazone 5 bid and diamox 250 bid.  I/Os not accurate. Had issues w/ purewick cath yesterday and wet the bed. At least 3.4L in UOP was charted yesterday. CVP improving, down to 13-14 today.   Co-ox 81%  Creatinine stable at 2.2. K 4.0.  Right thoracentesis 9/23, 500 cc off => exudative by protein but not LDH.    She feels ok today. No major complaints.   Cardiac MRI:  1.  Normal LV size with EF 41%, diffuse hypokinesis. 2. Mildly dilated RV with severe systolic dysfunction, EF 93%. D-shaped septum suggests RV pressure/volume overload. 3.  Markedly dilated IVC suggestive of elevated RV filling pressure. 4. Nonspecific RV insertion site LGE, this suggests LV pressure/volume overload. 5. Moderate pleural effusion on right.    Objective:   Weight Range: 110 kg Body mass index is 45.82 kg/m.   Vital Signs:   Temp:  [97.6 F (36.4 C)-98.9 F (37.2 C)] 98.9 F (37.2 C) (09/28 0519) Pulse Rate:  [52-93] 92 (09/28 0519) Resp:  [19-25] 19 (09/28 0519) BP: (101-127)/(43-67) 103/43 (09/28 0519) SpO2:  [95 %-99 %] 98 % (09/28 0519) Weight:  [110 kg] 110 kg (09/28 0112) Last BM Date: 09/04/20  Weight change: Filed Weights   09/03/20 0400 09/04/20 0430 09/05/20 0112  Weight: 108 kg 110.9 kg 110 kg    Intake/Output:   Intake/Output Summary (Last 24 hours) at 09/05/2020 2671 Last data filed at 09/05/2020 0520 Gross per 24 hour  Intake 1120.85 ml  Output 3380 ml  Net -2259.15 ml      Physical Exam    CVP 13-14 General:  Obese female laying in bed. Dentition in poor repair. No respiratory difficulty HEENT: normal Neck: supple. JVD elevated to jaw line + HJR. Carotids 2+ bilat; no bruits. No  lymphadenopathy or thyromegaly appreciated. Cor: PMI nondisplaced. Irregularly irregular rhythm, regular rate No rubs, gallops or murmurs. Lungs: decreased BS at the bases  Abdomen: obese, soft, nontender, nondistended. No hepatosplenomegaly. No bruits or masses. Good bowel sounds. Extremities: no cyanosis, clubbing, rash, trace- 1+ bilateral LE edema, + bilateral unna boots  Neuro: alert & oriented x 3, cranial nerves grossly intact. moves all 4 extremities w/o difficulty. Affect pleasant.  Telemetry   Atrial fibrillation, 70s (personally reviewed)  Labs    CBC Recent Labs    09/04/20 0500 09/05/20 0453  WBC 4.1 3.8*  HGB 8.0* 8.1*  HCT 25.9* 26.7*  MCV 85.5 87.3  PLT 101* 245*   Basic Metabolic Panel Recent Labs    09/03/20 0420 09/03/20 0638 09/04/20 0500 09/05/20 0453  NA   < >  --  136 136  K   < >  --  3.6 4.0  CL   < >  --  87* 83*  CO2   < >  --  40* 42*  GLUCOSE   < >  --  183* 175*  BUN   < >  --  33* 35*  CREATININE   < >  --  2.22* 2.21*  CALCIUM   < >  --  8.9 9.4  MG  --  2.5* 2.4  --   PHOS  --   --  4.8*  --    < > = values in this interval not displayed.   Liver Function Tests No results for input(s): AST, ALT, ALKPHOS, BILITOT, PROT, ALBUMIN in the last 72 hours. No results for input(s): LIPASE, AMYLASE in the last 72 hours. Cardiac Enzymes No results for input(s): CKTOTAL, CKMB, CKMBINDEX, TROPONINI in the last 72 hours.  BNP: BNP (last 3 results) Recent Labs    06/27/20 0141 07/19/20 1752 08/28/20 0455  BNP 723.7* 620.4* 673.5*    ProBNP (last 3 results) No results for input(s): PROBNP in the last 8760 hours.   D-Dimer No results for input(s): DDIMER in the last 72 hours. Hemoglobin A1C No results for input(s): HGBA1C in the last 72 hours. Fasting Lipid Panel No results for input(s): CHOL, HDL, LDLCALC, TRIG, CHOLHDL, LDLDIRECT in the last 72 hours. Thyroid Function Tests No results for input(s): TSH, T4TOTAL, T3FREE, THYROIDAB  in the last 72 hours.  Invalid input(s): FREET3  Other results:   Imaging    No results found.   Medications:     Scheduled Medications: . acetaZOLAMIDE  250 mg Oral BID  . allopurinol  100 mg Oral BID  . aspirin EC  81 mg Oral Daily  . carvedilol  3.125 mg Oral BID WC  . Chlorhexidine Gluconate Cloth  6 each Topical Daily  . enoxaparin (LOVENOX) injection  40 mg Subcutaneous Q24H  . insulin aspart  0-15 Units Subcutaneous TID WC  . metolazone  5 mg Oral BID  . pantoprazole  40 mg Oral Daily  . polyethylene glycol  17 g Oral BID  . potassium chloride  40 mEq Oral TID  . senna-docusate  1 tablet Oral BID  . sodium chloride flush  10-40 mL Intracatheter Q12H    Infusions: . sodium chloride Stopped (08/29/20 1823)  . furosemide (LASIX) infusion 20 mg/hr (09/04/20 2353)  . milrinone 0.25 mcg/kg/min (09/04/20 2350)    PRN Medications: sodium chloride, acetaminophen, lidocaine (PF), ondansetron (ZOFRAN) IV, sodium chloride flush   Assessment/Plan   1. Acute on chronic systolic CHF: Prominent signs of RV failure.  I reviewed 9/21 echo, EF 40-45%, global hypokinesis, D-shaped IV septum suggestive of RV pressure/volume overload, RV poorly visualized but appeared normal in size with at least mild systolic dysfunction. Cause of cardiomyopathy is uncertain.  She has history of COVID-19 1/21, cannot rule out viral myocarditis. Chemotherapy-mediated CMP is possible (not sure what chemo she had remotely for breast cancer).  Cardiac MRI showed LV EF 41% with diffuse hypokinesis, RV mildly dilated but with EF 27% and D-shaped septum, nonspecific RV insertion site LGE.  Suspect RV > LV failure. Cardiac MRI is not suggestive of amyloidosis.  We have not ruled out CAD.  She has had recurrent HF admissions this year.   Milrinone started primarily for RV support. On lasix 20/hr and metolazone and diamox.  Now diuresing briskly. CVP down to 13-14 today. Co-ox 81%.  - Continue Lasix gtt at 20  mg/hr and continue metolazone 5 mg bid today. Follow BMET closely. Supp K - Continue 250 mg Acetazolamide bid - Continue milrinone 0.25 mcg/kg/min. - Continue Coreg  3.125 mg bid.  - No ARNI/spironolactone for now with elevated creatinine.  - Myeloma panel showed no M-spike. Urine IFXN pending.  - Consider RHC/LHC this admission if creatinine stabilizes (if not will at least need RHC).  Will need to minimize contrast.  - She is a Cardiomems candidate.  - Continue UNNA boots  2. Chronic hypoxemic respiratory failure: Suspect OHS/OSA.  She was using home oxygen prior to COVID-19 PNA.  - Continue supplemental oxygen.   - Will need sleep study as outpatient.  - No change 3. Atrial fibrillation: Chronic, all ECGs since 7/21 show atrial fibrillation.  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 Rate control is reasonable.  - She cannot be cardioverted at this point as she cannot be chronically anticoagulated.  - Consider Watchman device placement, though would need 45 days warfarin and Plavix x 6 months after that.  - No change 4. GI bleeding: Recurrent, from small bowel AVMs. No overt bleeding. Hgb 8.1 today.  - Tsat 22% (ok) - Transfuse hgb < 7.  5. Right pleural effusion: Suspect due to CHF.  Had right thoracentesis with 500 cc off on 9/23, pleural fluid protein suggested exudative though PF LDH did not.   6. 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  7. CKD: Stage IV.  Follow creatinine closely with diuresis, - stable today at 2.21 8. Thrombocytopenia: Mild, stable.    9. Deconditioning:  - OOB and ambulate w/ PT today  Length of Stay: [address] PA-C  09/05/2020, 7:12 AM  Advanced Heart Failure Team Pager [phone] (M-F; [place])  Please contact [facility] for night-coverage after hours (4p -7a ) and weekends on [email or web address]  Patient seen with PA, agree with the above note.   She has  been diuresing better, I/Os negative 2279 cc yesterday and weight trending down.  CVP lower at 13-14.  Co-ox 80%.   General: NAD Neck: JVP 12 cm, no thyromegaly or thyroid nodule.  Lungs: Mildly decreased at bases. CV: Nondisplaced PMI.  Heart irregular S1/S2, no S3/S4, 2/6 SEM RUSB.  1+ chronic edema to knees. Abdomen: Soft, nontender, no hepatosplenomegaly, no distention.  Skin: Intact without lesions or rashes.  Neurologic: Alert and oriented x 3.  Psych: Normal affect. Extremities: No clubbing or cyanosis.  HEENT: Normal.   Doing better, starting to diurese but on very high dose diuretics at this point.  Renal function remains stable. CVP still high but coming down, now 13-14.  - Continue current diuretics for at least one more day, transition to po when CVP < 10.   - Continue milrinone 0.25 primarily for RV support.  - RHC in future (probably Thursday), will check coronaries if creatinine comes down.   Atrial fibrillation is rate-controlled and chronic.  Not anticoagulated so no DCCV.  May be Watchman candidate.   Ambulate in hall.   Loralie Champagne 09/05/2020 8:17 AM

## 2020-09-06 LAB — COMPREHENSIVE METABOLIC PANEL
ALT: 13 U/L (ref 0–44)
AST: 27 U/L (ref 15–41)
Albumin: 3.4 g/dL — ABNORMAL LOW (ref 3.5–5.0)
Alkaline Phosphatase: 80 U/L (ref 38–126)
Anion gap: 9 (ref 5–15)
BUN: 32 mg/dL — ABNORMAL HIGH (ref 8–23)
CO2: 45 mmol/L — ABNORMAL HIGH (ref 22–32)
Calcium: 9.3 mg/dL (ref 8.9–10.3)
Chloride: 83 mmol/L — ABNORMAL LOW (ref 98–111)
Creatinine, Ser: 2.22 mg/dL — ABNORMAL HIGH (ref 0.44–1.00)
GFR calc Af Amer: 26 mL/min — ABNORMAL LOW (ref >60)
GFR calc non Af Amer: 22 mL/min — ABNORMAL LOW (ref >60)
Glucose, Bld: 135 mg/dL — ABNORMAL HIGH (ref 70–99)
Potassium: 3.8 mmol/L (ref 3.5–5.1)
Sodium: 137 mmol/L (ref 135–145)
Total Bilirubin: 2.6 mg/dL — ABNORMAL HIGH (ref 0.3–1.2)
Total Protein: 8 g/dL (ref 6.5–8.1)

## 2020-09-06 LAB — CBC
HCT: 27.7 % — ABNORMAL LOW (ref 36.0–46.0)
Hemoglobin: 8.5 g/dL — ABNORMAL LOW (ref 12.0–15.0)
MCH: 26.7 pg (ref 26.0–34.0)
MCHC: 30.7 g/dL (ref 30.0–36.0)
MCV: 87.1 fL (ref 80.0–100.0)
Platelets: 110 10*3/uL — ABNORMAL LOW (ref 150–400)
RBC: 3.18 MIL/uL — ABNORMAL LOW (ref 3.87–5.11)
RDW: 18.6 % — ABNORMAL HIGH (ref 11.5–15.5)
WBC: 3.9 10*3/uL — ABNORMAL LOW (ref 4.0–10.5)
nRBC: 0 % (ref 0.0–0.2)

## 2020-09-06 LAB — GLUCOSE, CAPILLARY
Glucose-Capillary: 130 mg/dL — ABNORMAL HIGH (ref 70–99)
Glucose-Capillary: 131 mg/dL — ABNORMAL HIGH (ref 70–99)
Glucose-Capillary: 141 mg/dL — ABNORMAL HIGH (ref 70–99)
Glucose-Capillary: 165 mg/dL — ABNORMAL HIGH (ref 70–99)

## 2020-09-06 LAB — COOXEMETRY PANEL
Carboxyhemoglobin: 2.4 % — ABNORMAL HIGH (ref 0.5–1.5)
Methemoglobin: 1.7 % — ABNORMAL HIGH (ref 0.0–1.5)
O2 Saturation: 80.2 %
Total hemoglobin: 8.8 g/dL — ABNORMAL LOW (ref 12.0–16.0)

## 2020-09-06 LAB — IMMUNOFIXATION, URINE

## 2020-09-06 MED ORDER — ISOSORB DINITRATE-HYDRALAZINE 20-37.5 MG PO TABS
0.5000 | ORAL_TABLET | Freq: Three times a day (TID) | ORAL | Status: DC
  Administered 2020-09-06 – 2020-09-08 (×7): 0.5 via ORAL
  Filled 2020-09-06 (×7): qty 1

## 2020-09-06 MED ORDER — TORSEMIDE 20 MG PO TABS
40.0000 mg | ORAL_TABLET | Freq: Two times a day (BID) | ORAL | Status: DC
  Administered 2020-09-06 – 2020-09-08 (×4): 40 mg via ORAL
  Filled 2020-09-06 (×5): qty 2

## 2020-09-06 MED ORDER — POTASSIUM CHLORIDE CRYS ER 20 MEQ PO TBCR: 40.0000 meq | EXTENDED_RELEASE_TABLET | Freq: Once | ORAL | Status: DC

## 2020-09-06 MED ORDER — MILRINONE LACTATE IN DEXTROSE 20-5 MG/100ML-% IV SOLN
0.1250 ug/kg/min | INTRAVENOUS | Status: DC
  Administered 2020-09-06: 0.125 ug/kg/min via INTRAVENOUS
  Filled 2020-09-06: qty 100

## 2020-09-06 NOTE — H&P (View-Only) (Signed)
Patient ID: Rita [name], female   DOB: [birth year], 64 y.o.   MRN: [id number]     Advanced Heart Failure Rounding Note  PCP-Cardiologist: No primary care provider on file.   Subjective:    Milrinone gtt 0.25 started 9/23.   Now on Lasix gtt at 20 mg/hr  Also getting metolazone 5 bid and diamox 250 bid.  Good diuresis again yesterday, weight continues to come down.  CVP 7 today. Co-ox 80%.   Creatinine stable at 2.2.  Right thoracentesis 9/23, 500 cc off => exudative by protein but not LDH.    No complaints, walking to BR without problems.   Cardiac MRI:  1.  Normal LV size with EF 41%, diffuse hypokinesis. 2. Mildly dilated RV with severe systolic dysfunction, EF 93%. D-shaped septum suggests RV pressure/volume overload. 3.  Markedly dilated IVC suggestive of elevated RV filling pressure. 4. Nonspecific RV insertion site LGE, this suggests LV pressure/volume overload. 5. Moderate pleural effusion on right.    Objective:   Weight Range: 104.9 kg Body mass index is 43.68 kg/m.   Vital Signs:   Temp:  [97.8 F (36.6 C)-99.4 F (37.4 C)] 99.4 F (37.4 C) (09/29 1138) Pulse Rate:  [71-99] 71 (09/29 1138) Resp:  [18-25] 20 (09/29 1138) BP: (106-122)/(51-75) 107/61 (09/29 1138) SpO2:  [70 %-100 %] 99 % (09/29 1138) Weight:  [104.9 kg] 104.9 kg (09/29 0400) Last BM Date: 09/04/20  Weight change: Filed Weights   09/04/20 0430 09/05/20 0112 09/06/20 0400  Weight: 110.9 kg 110 kg 104.9 kg    Intake/Output:   Intake/Output Summary (Last 24 hours) at 09/06/2020 1145 Last data filed at 09/06/2020 0900 Gross per 24 hour  Intake 1925.98 ml  Output 2900 ml  Net -974.02 ml      Physical Exam    CVP 7 General: NAD Neck: No JVD, no thyromegaly or thyroid nodule.  Lungs: Clear to auscultation bilaterally with normal respiratory effort. CV: Nondisplaced PMI.  Heart irregular S1/S2, no S3/S4, 2/6 early SEM RUSB.  No peripheral edema.   Abdomen: Soft,  nontender, no hepatosplenomegaly, no distention.  Skin: Intact without lesions or rashes.  Neurologic: Alert and oriented x 3.  Psych: Normal affect. Extremities: No clubbing or cyanosis.  HEENT: Normal.    Telemetry   Atrial fibrillation, 70s (personally reviewed)  Labs    CBC Recent Labs    09/05/20 0453 09/06/20 0524  WBC 3.8* 3.9*  HGB 8.1* 8.5*  HCT 26.7* 27.7*  MCV 87.3 87.1  PLT 102* 810*   Basic Metabolic Panel Recent Labs    09/04/20 0500 09/04/20 0500 09/05/20 0453 09/06/20 0524  NA 136   < > 136 137  K 3.6   < > 4.0 3.8  CL 87*   < > 83* 83*  CO2 40*   < > 42* 45*  GLUCOSE 183*   < > 175* 135*  BUN 33*   < > 35* 32*  CREATININE 2.22*   < > 2.21* 2.22*  CALCIUM 8.9   < > 9.4 9.3  MG 2.4  --   --   --   PHOS 4.8*  --   --   --    < > = values in this interval not displayed.   Liver Function Tests Recent Labs    09/06/20 0524  AST 27  ALT 13  ALKPHOS 80  BILITOT 2.6*  PROT 8.0  ALBUMIN 3.4*   No results for input(s): LIPASE, AMYLASE in the last 72  hours. Cardiac Enzymes No results for input(s): CKTOTAL, CKMB, CKMBINDEX, TROPONINI in the last 72 hours.  BNP: BNP (last 3 results) Recent Labs    06/27/20 0141 07/19/20 1752 08/28/20 0455  BNP 723.7* 620.4* 673.5*    ProBNP (last 3 results) No results for input(s): PROBNP in the last 8760 hours.   D-Dimer No results for input(s): DDIMER in the last 72 hours. Hemoglobin A1C No results for input(s): HGBA1C in the last 72 hours. Fasting Lipid Panel No results for input(s): CHOL, HDL, LDLCALC, TRIG, CHOLHDL, LDLDIRECT in the last 72 hours. Thyroid Function Tests No results for input(s): TSH, T4TOTAL, T3FREE, THYROIDAB in the last 72 hours.  Invalid input(s): FREET3  Other results:   Imaging    No results found.   Medications:     Scheduled Medications: . acetaZOLAMIDE  250 mg Oral BID  . allopurinol  100 mg Oral BID  . aspirin EC  81 mg Oral Daily  . carvedilol  3.125  mg Oral BID WC  . Chlorhexidine Gluconate Cloth  6 each Topical Daily  . enoxaparin (LOVENOX) injection  40 mg Subcutaneous Q24H  . insulin aspart  0-15 Units Subcutaneous TID WC  . isosorbide-hydrALAZINE  0.5 tablet Oral TID  . pantoprazole  40 mg Oral Daily  . polyethylene glycol  17 g Oral BID  . senna-docusate  1 tablet Oral BID  . sodium chloride flush  10-40 mL Intracatheter Q12H  . torsemide  40 mg Oral BID    Infusions: . sodium chloride Stopped (08/29/20 1823)  . milrinone 0.125 mcg/kg/min (09/06/20 0847)    PRN Medications: sodium chloride, acetaminophen, lidocaine (PF), ondansetron (ZOFRAN) IV, sodium chloride flush   Assessment/Plan   1. Acute on chronic systolic CHF: Prominent signs of RV failure.  I reviewed 9/21 echo, EF 40-45%, global hypokinesis, D-shaped IV septum suggestive of RV pressure/volume overload, RV poorly visualized but appeared normal in size with at least mild systolic dysfunction. Cause of cardiomyopathy is uncertain.  She has history of COVID-19 1/21, cannot rule out viral myocarditis. Chemotherapy-mediated CMP is possible (not sure what chemo she had remotely for breast cancer).  Cardiac MRI showed LV EF 41% with diffuse hypokinesis, RV mildly dilated but with EF 27% and D-shaped septum, nonspecific RV insertion site LGE.  Suspect RV > LV failure. Cardiac MRI is not suggestive of amyloidosis, no M-spike on myeloma panel.  We have not ruled out CAD.  She has had recurrent HF admissions this year.   Milrinone started primarily for RV support. On lasix 20 mg/hr and metolazone and diamox.  Now diuresing briskly. CVP down to 7 today and looks euvolemic on exam. Co-ox 80%.  - Stop Lasix and metolazone.  - Continue acetazolamide today with HCO3 45.  - Start torsemide 40 mg bid this evening.  - Decrease milrinone to 0.125 mcg/kg/min. - Continue Coreg  3.125 mg bid.  - No ARNI/spironolactone for now with elevated creatinine.  - Start Bidil 0.5 tab tid.  - RHC  tomorrow to formally assess filling pressures, PA pressure, and cardiac output. Would hold off on coronary angiography with no chest pain and creatinine 2.2.  - She is a Cardiomems candidate.  - Continue UNNA boots  2. Chronic hypoxemic respiratory failure: Suspect OHS/OSA.  She was using home oxygen prior to COVID-19 PNA.  - Continue supplemental oxygen.   - Will need sleep study as outpatient.  3. Atrial fibrillation: Chronic, all ECGs since 7/21 show atrial fibrillation.  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 Rate control is reasonable.  - She cannot be cardioverted at this point as she cannot be chronically anticoagulated.  - Consider Watchman device placement, though would need 45 days warfarin and Plavix x 6 months after that.  - No change 4. GI bleeding: Recurrent, from small bowel AVMs. No overt bleeding. Hgb 8.5 today.  - Tsat 22% (ok) - Transfuse hgb < 7.  5. Right pleural effusion: Suspect due to CHF.  Had right thoracentesis with 500 cc off on 9/23, pleural fluid protein suggested exudative though PF LDH did not.   6. 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  7. CKD: Stage IV.  Follow creatinine closely with diuresis, stable at 2.2 today.  8. Thrombocytopenia: Mild, stable.    9. Deconditioning:  - OOB and ambulate w/ PT today  Length of Stay: 9  Loralie Champagne, MD  09/06/2020, 11:45 AM  Advanced Heart Failure Team Pager [phone] (M-F; [place])  Please contact [facility] for night-coverage after hours (4p -7a ) and weekends on [email or web address]

## 2020-09-06 NOTE — Progress Notes (Signed)
Subjective:  Rita [name] reports that she feels "much better" today after more diuresis yesterday. She states that her breathing and edema are both improved today. She has been seen by HF team who suggested transitioning her to PO diuretics and preparing for RHC tomorrow-- she is amenable to this plan and hopeful for discharge home in a few days. She continues to eat and drink well. Denies any abdominal pain, CP, or headache.   Objective:  Vital signs in last 24 hours: Vitals:   09/06/20 0400 09/06/20 0745 09/06/20 0800 09/06/20 1138  BP: (!) 119/51 (!) 118/56 121/75 107/61  Pulse: 73 79 94 71  Resp: (!) 25 (!) 24 19 20   Temp: 98.5 F (36.9 C) 98.4 F (36.9 C)  99.4 F (37.4 C)  TempSrc: Tympanic Oral  Oral  SpO2: 99% 100% (!) 70% 99%  Weight: 104.9 kg     Height:       Weight change: -5.126 kg  Intake/Output Summary (Last 24 hours) at 09/06/2020 1241 Last data filed at 09/06/2020 0900 Gross per 24 hour  Intake 1925.98 ml  Output 2900 ml  Net -974.02 ml   Physical Exam Vitals and nursing note reviewed.  Constitutional:      General: She is not in acute distress.    Interventions: Nasal cannula in place.     Comments: 2L nasal cannula  Cardiovascular:     Rate and Rhythm: Rhythm irregularly irregular.     Heart sounds: Murmur heard.  Systolic (best heard at LUSB) murmur is present with a grade of 2/6.      Comments: Bilateral LE edema much improved from previous. Unna boots in place, somewhat loose from decreased edema.  JVD remains mildly elevated, but improved from yesterday.  Pulmonary:     Effort: Pulmonary effort is normal. No respiratory distress.     Breath sounds: Decreased breath sounds (In bases, R>L) present.  Neurological:     Mental Status: She is alert.     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 Symptoms improved andweight down ~39 lb since admissionwith diuresis. Still has some volume on exam, but her JVD is improved today compared to previous. I/O: net -2215yesterday.  Weight change: -5.126 kg Co ox 80.2%, stable from yesterday, CVP7today (13-14yesterday). Uncertain etiology of CMP. 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 Cardiac MRI: Not auggestive of amyloidosis.LVEF 41% w diffuse hypokinesia. Septal patterns suggestive of RV.LV pressure and volume overload.Had mildly dilated IVC suggestiveof elevated RV filling pressure. Heart failure team have been kindly following and managing her heart failure.   She is much improved from previous days with aggressive IV diuresis. Her CVP is now within normal limits. Per HF team, okay to transition to PO diuretics and prepare for RHC tomorrow. Will hold off on LHC due to lack of CP and elevated Cr.  - Stop Lasix gtt - Start Torsemide 40mg  PO BID -  Stop Metolazone  - Continue Acetazolamide 250mg  BID - Continue Coreg 3.125 mg BID - Decrease Milrinone  to 0.125 mcg/kg/min  - HoldingARNI/spironolactone given elevated creatinine   - BMP daily             - K 3.8 today             - Continue Klor-Con 40 mEq TID - Strict I/O - Fluid restriction - Cr stable with diuresis - Appreciate HF team care - Weight daily - RHC tomorrow  Goals of Care: Patient and her husband have a good understanding of her illness and have a common goal of keeping her out of the hospital and maximizing time together at home even if it means forgoing aggressive interventions.  - Will  revisit code status in a day or two   Persistent Atrial Fibrillation: Not on [facility] and also not a candidate for cardioversion. due to Hx of GI bleeding and multiple AVMs.  PerHF team, may considerWatchman device placement, but she will need warfarin x 45 days andneed 45 days and Plavix x 6 months. - Rate remains control on decreased dose of Coreg 3.25 mg BID. - Continue ASA 81mg  daily  - Telemetry  CKD 4: Cr stable w diuresis - BMP daily - Avoid nephrotoxins and contrast as possible  Uncontrolled Type II DM: Does not check blood sugar at home. Home meds are Metformin 500mg  in the morning only and glipizide 5mg  daily. A1c 7.5.Glucose better controlled today in 130s on moderate SSI. - ContinuemoderateSSI with moderate correction coverage - Consideradding empaglaflozin to optimize cardioprotection CBG (last 3)  Recent Labs    09/05/20 2125 09/06/20 0621 09/06/20 1107  GLUCAP 164* 130* 131*    Controlled HTN: Blood pressures well controlled and stable. HF team adjusting HF medications. - Consider adding ACEI/ARB/ARNI for cardioprotective effects, but hold off until her AKI resolves      LOS: 9 days   Rita [name], Medical Student 09/06/2020, 12:41 PM

## 2020-09-06 NOTE — Progress Notes (Signed)
Patient ID: Rita [name], female   DOB: 1956/01/27, 64 y.o.   MRN: [id number]     Advanced Heart Failure Rounding Note  PCP-Cardiologist: No primary care provider on file.   Subjective:    Milrinone gtt 0.25 started 9/23.   Now on Lasix gtt at 20 mg/hr  Also getting metolazone 5 bid and diamox 250 bid.  Good diuresis again yesterday, weight continues to come down.  CVP 7 today. Co-ox 80%.   Creatinine stable at 2.2.  Right thoracentesis 9/23, 500 cc off => exudative by protein but not LDH.    No complaints, walking to BR without problems.   Cardiac MRI:  1.  Normal LV size with EF 41%, diffuse hypokinesis. 2. Mildly dilated RV with severe systolic dysfunction, EF 97%. D-shaped septum suggests RV pressure/volume overload. 3.  Markedly dilated IVC suggestive of elevated RV filling pressure. 4. Nonspecific RV insertion site LGE, this suggests LV pressure/volume overload. 5. Moderate pleural effusion on right.    Objective:   Weight Range: 104.9 kg Body mass index is 43.68 kg/m.   Vital Signs:   Temp:  [97.8 F (36.6 C)-99.4 F (37.4 C)] 99.4 F (37.4 C) (09/29 1138) Pulse Rate:  [71-99] 71 (09/29 1138) Resp:  [18-25] 20 (09/29 1138) BP: (106-122)/(51-75) 107/61 (09/29 1138) SpO2:  [70 %-100 %] 99 % (09/29 1138) Weight:  [104.9 kg] 104.9 kg (09/29 0400) Last BM Date: 09/04/20  Weight change: Filed Weights   09/04/20 0430 09/05/20 0112 09/06/20 0400  Weight: 110.9 kg 110 kg 104.9 kg    Intake/Output:   Intake/Output Summary (Last 24 hours) at 09/06/2020 1145 Last data filed at 09/06/2020 0900 Gross per 24 hour  Intake 1925.98 ml  Output 2900 ml  Net -974.02 ml      Physical Exam    CVP 7 General: NAD Neck: No JVD, no thyromegaly or thyroid nodule.  Lungs: Clear to auscultation bilaterally with normal respiratory effort. CV: Nondisplaced PMI.  Heart irregular S1/S2, no S3/S4, 2/6 early SEM RUSB.  No peripheral edema.   Abdomen: Soft,  nontender, no hepatosplenomegaly, no distention.  Skin: Intact without lesions or rashes.  Neurologic: Alert and oriented x 3.  Psych: Normal affect. Extremities: No clubbing or cyanosis.  HEENT: Normal.    Telemetry   Atrial fibrillation, 70s (personally reviewed)  Labs    CBC Recent Labs    09/05/20 0453 09/06/20 0524  WBC 3.8* 3.9*  HGB 8.1* 8.5*  HCT 26.7* 27.7*  MCV 87.3 87.1  PLT 102* 353*   Basic Metabolic Panel Recent Labs    09/04/20 0500 09/04/20 0500 09/05/20 0453 09/06/20 0524  NA 136   < > 136 137  K 3.6   < > 4.0 3.8  CL 87*   < > 83* 83*  CO2 40*   < > 42* 45*  GLUCOSE 183*   < > 175* 135*  BUN 33*   < > 35* 32*  CREATININE 2.22*   < > 2.21* 2.22*  CALCIUM 8.9   < > 9.4 9.3  MG 2.4  --   --   --   PHOS 4.8*  --   --   --    < > = values in this interval not displayed.   Liver Function Tests Recent Labs    09/06/20 0524  AST 27  ALT 13  ALKPHOS 80  BILITOT 2.6*  PROT 8.0  ALBUMIN 3.4*   No results for input(s): LIPASE, AMYLASE in the last 72  hours. Cardiac Enzymes No results for input(s): CKTOTAL, CKMB, CKMBINDEX, TROPONINI in the last 72 hours.  BNP: BNP (last 3 results) Recent Labs    06/27/20 0141 07/19/20 1752 08/28/20 0455  BNP 723.7* 620.4* 673.5*    ProBNP (last 3 results) No results for input(s): PROBNP in the last 8760 hours.   D-Dimer No results for input(s): DDIMER in the last 72 hours. Hemoglobin A1C No results for input(s): HGBA1C in the last 72 hours. Fasting Lipid Panel No results for input(s): CHOL, HDL, LDLCALC, TRIG, CHOLHDL, LDLDIRECT in the last 72 hours. Thyroid Function Tests No results for input(s): TSH, T4TOTAL, T3FREE, THYROIDAB in the last 72 hours.  Invalid input(s): FREET3  Other results:   Imaging    No results found.   Medications:     Scheduled Medications: . acetaZOLAMIDE  250 mg Oral BID  . allopurinol  100 mg Oral BID  . aspirin EC  81 mg Oral Daily  . carvedilol  3.125  mg Oral BID WC  . Chlorhexidine Gluconate Cloth  6 each Topical Daily  . enoxaparin (LOVENOX) injection  40 mg Subcutaneous Q24H  . insulin aspart  0-15 Units Subcutaneous TID WC  . isosorbide-hydrALAZINE  0.5 tablet Oral TID  . pantoprazole  40 mg Oral Daily  . polyethylene glycol  17 g Oral BID  . senna-docusate  1 tablet Oral BID  . sodium chloride flush  10-40 mL Intracatheter Q12H  . torsemide  40 mg Oral BID    Infusions: . sodium chloride Stopped (08/29/20 1823)  . milrinone 0.125 mcg/kg/min (09/06/20 0847)    PRN Medications: sodium chloride, acetaminophen, lidocaine (PF), ondansetron (ZOFRAN) IV, sodium chloride flush   Assessment/Plan   1. Acute on chronic systolic CHF: Prominent signs of RV failure.  I reviewed 9/21 echo, EF 40-45%, global hypokinesis, D-shaped IV septum suggestive of RV pressure/volume overload, RV poorly visualized but appeared normal in size with at least mild systolic dysfunction. Cause of cardiomyopathy is uncertain.  She has history of COVID-19 1/21, cannot rule out viral myocarditis. Chemotherapy-mediated CMP is possible (not sure what chemo she had remotely for breast cancer).  Cardiac MRI showed LV EF 41% with diffuse hypokinesis, RV mildly dilated but with EF 27% and D-shaped septum, nonspecific RV insertion site LGE.  Suspect RV > LV failure. Cardiac MRI is not suggestive of amyloidosis, no M-spike on myeloma panel.  We have not ruled out CAD.  She has had recurrent HF admissions this year.   Milrinone started primarily for RV support. On lasix 20 mg/hr and metolazone and diamox.  Now diuresing briskly. CVP down to 7 today and looks euvolemic on exam. Co-ox 80%.  - Stop Lasix and metolazone.  - Continue acetazolamide today with HCO3 45.  - Start torsemide 40 mg bid this evening.  - Decrease milrinone to 0.125 mcg/kg/min. - Continue Coreg  3.125 mg bid.  - No ARNI/spironolactone for now with elevated creatinine.  - Start Bidil 0.5 tab tid.  - RHC  tomorrow to formally assess filling pressures, PA pressure, and cardiac output. Would hold off on coronary angiography with no chest pain and creatinine 2.2.  - She is a Cardiomems candidate.  - Continue UNNA boots  2. Chronic hypoxemic respiratory failure: Suspect OHS/OSA.  She was using home oxygen prior to COVID-19 PNA.  - Continue supplemental oxygen.   - Will need sleep study as outpatient.  3. Atrial fibrillation: Chronic, all ECGs since 7/21 show atrial fibrillation.  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 Rate control is reasonable.  - She cannot be cardioverted at this point as she cannot be chronically anticoagulated.  - Consider Watchman device placement, though would need 45 days warfarin and Plavix x 6 months after that.  - No change 4. GI bleeding: Recurrent, from small bowel AVMs. No overt bleeding. Hgb 8.5 today.  - Tsat 22% (ok) - Transfuse hgb < 7.  5. Right pleural effusion: Suspect due to CHF.  Had right thoracentesis with 500 cc off on 9/23, pleural fluid protein suggested exudative though PF LDH did not.   6. 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  7. CKD: Stage IV.  Follow creatinine closely with diuresis, stable at 2.2 today.  8. Thrombocytopenia: Mild, stable.    9. Deconditioning:  - OOB and ambulate w/ PT today  Length of Stay: 9  Loralie Champagne, MD  09/06/2020, 11:45 AM  Advanced Heart Failure Team Pager [phone] (M-F; [place])  Please contact [facility] for night-coverage after hours (4p -7a ) and weekends on [email or web address]

## 2020-09-07 ENCOUNTER — Encounter (HOSPITAL_COMMUNITY)
Admission: EM | Disposition: A | Discharge status: Home Health | Payer: Self-pay | Source: Home / Self Care | Attending: Internal Medicine

## 2020-09-07 DIAGNOSIS — D61818 Other pancytopenia: Secondary | ICD-10-CM

## 2020-09-07 HISTORY — PX: RIGHT HEART CATH: CATH118263

## 2020-09-07 LAB — POCT I-STAT EG7
Acid-Base Excess: 24 mmol/L — ABNORMAL HIGH (ref 0.0–2.0)
Acid-Base Excess: 28 mmol/L — ABNORMAL HIGH (ref 0.0–2.0)
Bicarbonate: 51.7 mmol/L — ABNORMAL HIGH (ref 20.0–28.0)
Bicarbonate: 55.5 mmol/L — ABNORMAL HIGH (ref 20.0–28.0)
Calcium, Ion: 1.04 mmol/L — ABNORMAL LOW (ref 1.15–1.40)
Calcium, Ion: 1.09 mmol/L — ABNORMAL LOW (ref 1.15–1.40)
HCT: 33 % — ABNORMAL LOW (ref 36.0–46.0)
HCT: 34 % — ABNORMAL LOW (ref 36.0–46.0)
Hemoglobin: 11.2 g/dL — ABNORMAL LOW (ref 12.0–15.0)
Hemoglobin: 11.6 g/dL — ABNORMAL LOW (ref 12.0–15.0)
O2 Saturation: 60 %
O2 Saturation: 64 %
Potassium: 3.3 mmol/L — ABNORMAL LOW (ref 3.5–5.1)
Potassium: 3.4 mmol/L — ABNORMAL LOW (ref 3.5–5.1)
Sodium: 138 mmol/L (ref 135–145)
Sodium: 139 mmol/L (ref 135–145)
TCO2: >50 mmol/L — ABNORMAL HIGH (ref 22–32)
TCO2: >50 mmol/L — ABNORMAL HIGH (ref 22–32)
pCO2, Ven: 67.7 mmHg — ABNORMAL HIGH (ref 44.0–60.0)
pCO2, Ven: 72.5 mmHg (ref 44.0–60.0)
pH, Ven: 7.49 — ABNORMAL HIGH (ref 7.250–7.430)
pH, Ven: 7.492 — ABNORMAL HIGH (ref 7.250–7.430)
pO2, Ven: 31 mmHg — CL (ref 32.0–45.0)
pO2, Ven: 32 mmHg (ref 32.0–45.0)

## 2020-09-07 LAB — BASIC METABOLIC PANEL
Anion gap: 11 (ref 5–15)
Anion gap: 12 (ref 5–15)
Anion gap: 12 (ref 5–15)
BUN: 33 mg/dL — ABNORMAL HIGH (ref 8–23)
BUN: 35 mg/dL — ABNORMAL HIGH (ref 8–23)
BUN: 36 mg/dL — ABNORMAL HIGH (ref 8–23)
CO2: 44 mmol/L — ABNORMAL HIGH (ref 22–32)
CO2: 45 mmol/L — ABNORMAL HIGH (ref 22–32)
CO2: 42 mmol/L — ABNORMAL HIGH (ref 22–32)
Calcium: 9.2 mg/dL (ref 8.9–10.3)
Calcium: 9.6 mg/dL (ref 8.9–10.3)
Calcium: 9.3 mg/dL (ref 8.9–10.3)
Chloride: 82 mmol/L — ABNORMAL LOW (ref 98–111)
Chloride: 79 mmol/L — ABNORMAL LOW (ref 98–111)
Chloride: 81 mmol/L — ABNORMAL LOW (ref 98–111)
Creatinine, Ser: 2.28 mg/dL — ABNORMAL HIGH (ref 0.44–1.00)
Creatinine, Ser: 2.34 mg/dL — ABNORMAL HIGH (ref 0.44–1.00)
Creatinine, Ser: 2.29 mg/dL — ABNORMAL HIGH (ref 0.44–1.00)
GFR calc Af Amer: 25 mL/min — ABNORMAL LOW (ref >60)
GFR calc Af Amer: 24 mL/min — ABNORMAL LOW (ref >60)
GFR calc Af Amer: 25 mL/min — ABNORMAL LOW (ref >60)
GFR calc non Af Amer: 22 mL/min — ABNORMAL LOW (ref >60)
GFR calc non Af Amer: 21 mL/min — ABNORMAL LOW (ref >60)
GFR calc non Af Amer: 22 mL/min — ABNORMAL LOW (ref >60)
Glucose, Bld: 139 mg/dL — ABNORMAL HIGH (ref 70–99)
Glucose, Bld: 151 mg/dL — ABNORMAL HIGH (ref 70–99)
Glucose, Bld: 191 mg/dL — ABNORMAL HIGH (ref 70–99)
Potassium: 3 mmol/L — ABNORMAL LOW (ref 3.5–5.1)
Potassium: 3.5 mmol/L (ref 3.5–5.1)
Potassium: 3.5 mmol/L (ref 3.5–5.1)
Sodium: 137 mmol/L (ref 135–145)
Sodium: 136 mmol/L (ref 135–145)
Sodium: 135 mmol/L (ref 135–145)

## 2020-09-07 LAB — COOXEMETRY PANEL
Carboxyhemoglobin: 1.9 % — ABNORMAL HIGH (ref 0.5–1.5)
Methemoglobin: 1 % (ref 0.0–1.5)
O2 Saturation: 84.8 %
Total hemoglobin: 8.9 g/dL — ABNORMAL LOW (ref 12.0–16.0)

## 2020-09-07 LAB — CBC
HCT: 28.1 % — ABNORMAL LOW (ref 36.0–46.0)
Hemoglobin: 8.4 g/dL — ABNORMAL LOW (ref 12.0–15.0)
MCH: 25.8 pg — ABNORMAL LOW (ref 26.0–34.0)
MCHC: 29.9 g/dL — ABNORMAL LOW (ref 30.0–36.0)
MCV: 86.5 fL (ref 80.0–100.0)
Platelets: 117 10*3/uL — ABNORMAL LOW (ref 150–400)
RBC: 3.25 MIL/uL — ABNORMAL LOW (ref 3.87–5.11)
RDW: 18.9 % — ABNORMAL HIGH (ref 11.5–15.5)
WBC: 3.9 10*3/uL — ABNORMAL LOW (ref 4.0–10.5)
nRBC: 0 % (ref 0.0–0.2)

## 2020-09-07 LAB — GLUCOSE, CAPILLARY
Glucose-Capillary: 126 mg/dL — ABNORMAL HIGH (ref 70–99)
Glucose-Capillary: 114 mg/dL — ABNORMAL HIGH (ref 70–99)
Glucose-Capillary: 141 mg/dL — ABNORMAL HIGH (ref 70–99)
Glucose-Capillary: 221 mg/dL — ABNORMAL HIGH (ref 70–99)

## 2020-09-07 LAB — MAGNESIUM
Magnesium: 2.4 mg/dL (ref 1.7–2.4)
Magnesium: 2.5 mg/dL — ABNORMAL HIGH (ref 1.7–2.4)

## 2020-09-07 LAB — PHOSPHORUS: Phosphorus: 4.4 mg/dL (ref 2.5–4.6)

## 2020-09-07 SURGERY — RIGHT HEART CATH
Anesthesia: LOCAL

## 2020-09-07 MED ORDER — POTASSIUM CHLORIDE CRYS ER 20 MEQ PO TBCR
40.0000 meq | EXTENDED_RELEASE_TABLET | Freq: Three times a day (TID) | ORAL | Status: DC
  Administered 2020-09-07 – 2020-09-08 (×3): 40 meq via ORAL
  Filled 2020-09-07 (×4): qty 2

## 2020-09-07 MED ORDER — POTASSIUM CHLORIDE CRYS ER 20 MEQ PO TBCR: 40.0000 meq | EXTENDED_RELEASE_TABLET | Freq: Three times a day (TID) | ORAL | Status: DC

## 2020-09-07 MED ORDER — LIDOCAINE HCL (PF) 1 % IJ SOLN
INTRAMUSCULAR | Status: AC
  Filled 2020-09-07: qty 30

## 2020-09-07 MED ORDER — POTASSIUM CHLORIDE CRYS ER 20 MEQ PO TBCR
40.0000 meq | EXTENDED_RELEASE_TABLET | Freq: Once | ORAL | Status: AC
  Administered 2020-09-07: 40 meq via ORAL
  Filled 2020-09-07: qty 2

## 2020-09-07 MED ORDER — POTASSIUM CHLORIDE 10 MEQ/100ML IV SOLN
10.0000 meq | INTRAVENOUS | Status: DC
  Administered 2020-09-07: 10 meq via INTRAVENOUS
  Filled 2020-09-07 (×2): qty 100

## 2020-09-07 MED ORDER — LIDOCAINE HCL (PF) 1 % IJ SOLN
INTRAMUSCULAR | Status: DC | PRN
  Administered 2020-09-07: 1 mL

## 2020-09-07 MED ORDER — HEPARIN (PORCINE) IN NACL 1000-0.9 UT/500ML-% IV SOLN
INTRAVENOUS | Status: DC | PRN
  Administered 2020-09-07: 500 mL

## 2020-09-07 MED ORDER — SODIUM CHLORIDE 0.9 % IV SOLN: INTRAVENOUS | Status: DC

## 2020-09-07 MED ORDER — HEPARIN (PORCINE) IN NACL 1000-0.9 UT/500ML-% IV SOLN
INTRAVENOUS | Status: AC
  Filled 2020-09-07: qty 1000

## 2020-09-07 SURGICAL SUPPLY — 9 items
CATH BALLN WEDGE 5F 110CM (CATHETERS) ×1 IMPLANT
PACK CARDIAC CATHETERIZATION (CUSTOM PROCEDURE TRAY) ×2 IMPLANT
PROTECTION STATION PRESSURIZED (MISCELLANEOUS) ×2
SHEATH GLIDE SLENDER 4/5FR (SHEATH) ×1 IMPLANT
SHEATH PROBE COVER 6X72 (BAG) ×1 IMPLANT
STATION PROTECTION PRESSURIZED (MISCELLANEOUS) IMPLANT
TRANSDUCER W/STOPCOCK (MISCELLANEOUS) ×2 IMPLANT
TUBING ART PRESS 72  MALE/FEM (TUBING) ×2
TUBING ART PRESS 72 MALE/FEM (TUBING) IMPLANT

## 2020-09-07 NOTE — Interval H&P Note (Signed)
History and Physical Interval Note:  09/07/2020 10:02 AM  [place]  has presented today for surgery, with the diagnosis of heart failure.  The various methods of treatment have been discussed with the patient and family. After consideration of risks, benefits and other options for treatment, the patient has consented to  Procedure(s): RIGHT HEART CATH (N/A) as a surgical intervention.  The patient's history has been reviewed, patient examined, no change in status, stable for surgery.  I have reviewed the patient's chart and labs.  Questions were answered to the patient's satisfaction.     Coral Timme [employer]

## 2020-09-07 NOTE — Progress Notes (Signed)
Physical Therapy Treatment Patient Details Name: Rita [name] MRN: [id number] DOB: 1956/07/03 Today's Date: 09/07/2020    History of Present Illness 64 yo female with LE edema and SOB was noted to have CHF.  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, pulmonary HTN, persistent Afib, and diastolic HF on supplmental O2 prn    PT Comments    Pt tolerated ambulation during session; pt fatigued following ambulation and returned to recliner to rest; pt desated when o2 taken off (pt removed); pt demonstrating slow gait velocity and increased weight bearing through UEs during ambulation; pt continues to show deficits in balance, strength, coordination, gait and endurance and will benefit from skilled PT to address deficits to maximize independence with functional mobility prior to discharge.     Follow Up Recommendations  Home health PT     Equipment Recommendations  None recommended by PT    Recommendations for Other Services       Precautions / Restrictions Precautions Precautions: Fall Restrictions Weight Bearing Restrictions: No    Mobility  Bed Mobility                  Transfers Overall transfer level: Needs assistance Equipment used: Rolling walker (2 wheeled) Transfers: Sit to/from Stand;Stand Pivot Transfers Sit to Stand: Min guard Stand pivot transfers: Min guard       General transfer comment: cueing for hand placement on RW  Ambulation/Gait Ambulation/Gait assistance: Min guard Gait Distance (Feet): 16 Feet Assistive device: Rolling walker (2 wheeled) Gait Pattern/deviations: Step-through pattern;Decreased stride length;Trunk flexed;Wide base of support Gait velocity: decreased       Stairs             Wheelchair Mobility    Modified Rankin (Stroke Patients Only)       Balance Overall balance assessment: Needs assistance Sitting-balance support: Feet supported Sitting balance-Leahy Scale: Good      Standing balance support: Bilateral upper extremity supported Standing balance-Leahy Scale: Poor                              Cognition Arousal/Alertness: Awake/alert Behavior During Therapy: WFL for tasks assessed/performed Overall Cognitive Status: Within Functional Limits for tasks assessed                                        Exercises      General Comments        Pertinent Vitals/Pain Pain Assessment: No/denies pain    Home Living                      Prior Function            PT Goals (current goals can now be found in the care plan section) Acute Rehab PT Goals Patient Stated Goal: Get back home. PT Goal Formulation: With patient Time For Goal Achievement: 09/12/20 Potential to Achieve Goals: Good Progress towards PT goals: Progressing toward goals    Frequency    Min 3X/week      PT Plan Current plan remains appropriate    Co-evaluation              AM-PAC PT "6 Clicks" Mobility   Outcome Measure  Help needed turning from your back to your side while in a flat bed without using bedrails?: A  Little Help needed moving from lying on your back to sitting on the side of a flat bed without using bedrails?: A Little Help needed moving to and from a bed to a chair (including a wheelchair)?: A Little Help needed standing up from a chair using your arms (e.g., wheelchair or bedside chair)?: A Little Help needed to walk in hospital room?: A Little Help needed climbing 3-5 steps with a railing? : A Lot 6 Click Score: 17    End of Session Equipment Utilized During Treatment: Oxygen;Gait belt Activity Tolerance: Patient tolerated treatment well;Patient limited by fatigue Patient left: in chair;with call bell/phone within reach Nurse Communication: Mobility status (RN alerted CVP line reconnected after ambulation) PT Visit Diagnosis: Unsteadiness on feet (R26.81);Muscle weakness (generalized) (M62.81)     Time:  5320-2334 PT Time Calculation (min) (ACUTE ONLY): 18 min  Charges:  $Gait Training: 8-22 mins                     Lyanne Co, DPT Acute Rehabilitation Services [phone]   [place] Ranch 09/07/2020, 2:41 PM

## 2020-09-07 NOTE — Progress Notes (Signed)
Patient had 23 beats of wide QRS, asymptomatic, V/S stable cardiology PA notified. See epic for new lab order report given to night shift RN.

## 2020-09-07 NOTE — Progress Notes (Signed)
Orthopedic Tech Progress Note Patient Details:  Rita [name] 1956/05/31 [id number]  Ortho Devices Type of Ortho Device: Louretta Parma boot Ortho Device/Splint Location: BLE Ortho Device/Splint Interventions: Ordered, Application   Post Interventions Patient Tolerated: Well Instructions Provided: Care of device, Poper ambulation with device   Rossi Burdo 09/07/2020, 5:59 PM

## 2020-09-07 NOTE — Progress Notes (Addendum)
Patient ID: Rita [name], female   DOB: 1955/12/15, 64 y.o.   MRN: [id number]     Advanced Heart Failure Rounding Note  PCP-Cardiologist: No primary care provider on file.   Subjective:    Milrinone gtt reduced to 0.125 yesterday. Co-ox stable, 85%.    Changed to PO diuretics yesterday, getting torsemide + Diamox. -2.6L in UOP yesterday. Wt down an additional 3 lb. CVP 7-9.  SCr has stabilized ~2.2. CO2 remains elevated at 44.   Laying in bed. No dyspnea. Didn't get much sleep last night. On for RHC today.   Objective:   Weight Range: 104.2 kg Body mass index is 43.41 kg/m.   Vital Signs:   Temp:  [97.4 F (36.3 C)-99.4 F (37.4 C)] 97.4 F (36.3 C) (09/30 0400) Pulse Rate:  [65-94] 65 (09/29 2002) Resp:  [19-24] 20 (09/30 0400) BP: (100-121)/(44-75) 109/51 (09/30 0400) SpO2:  [70 %-100 %] 100 % (09/29 2002) Weight:  [104.2 kg] 104.2 kg (09/30 0600) Last BM Date: 09/04/20  Weight change: Filed Weights   09/05/20 0112 09/06/20 0400 09/07/20 0600  Weight: 110 kg 104.9 kg 104.2 kg    Intake/Output:   Intake/Output Summary (Last 24 hours) at 09/07/2020 0739 Last data filed at 09/07/2020 7829 Gross per 24 hour  Intake 720 ml  Output 2601 ml  Net -1881 ml      Physical Exam    CVP 7-9  General:  Obese female laying in bed. No respiratory difficulty HEENT: normal Neck: supple. JVD ~8 cm. Carotids 2+ bilat; no bruits. No lymphadenopathy or thyromegaly appreciated. Cor: PMI nondisplaced. Irregularly irregular rhythm, regular rate. 2/6 RUSB murmur  Lungs: clear Abdomen: obese, soft, nontender, nondistended. No hepatosplenomegaly. No bruits or masses. Good bowel sounds. Extremities: no cyanosis, clubbing, rash, edema, + bilateral unna boots  Neuro: alert & oriented x 3, cranial nerves grossly intact. moves all 4 extremities w/o difficulty. Affect pleasant.    Telemetry   Atrial fibrillation, 70s-80s (personally reviewed)  Labs    CBC Recent Labs     09/06/20 0524 09/07/20 0500  WBC 3.9* 3.9*  HGB 8.5* 8.4*  HCT 27.7* 28.1*  MCV 87.1 86.5  PLT 110* 562*   Basic Metabolic Panel Recent Labs    09/06/20 0524 09/07/20 0500  NA 137 137  K 3.8 3.0*  CL 83* 82*  CO2 45* 44*  GLUCOSE 135* 139*  BUN 32* 33*  CREATININE 2.22* 2.28*  CALCIUM 9.3 9.2  MG  --  2.4  PHOS  --  4.4   Liver Function Tests Recent Labs    09/06/20 0524  AST 27  ALT 13  ALKPHOS 80  BILITOT 2.6*  PROT 8.0  ALBUMIN 3.4*   No results for input(s): LIPASE, AMYLASE in the last 72 hours. Cardiac Enzymes No results for input(s): CKTOTAL, CKMB, CKMBINDEX, TROPONINI in the last 72 hours.  BNP: BNP (last 3 results) Recent Labs    06/27/20 0141 07/19/20 1752 08/28/20 0455  BNP 723.7* 620.4* 673.5*    ProBNP (last 3 results) No results for input(s): PROBNP in the last 8760 hours.   D-Dimer No results for input(s): DDIMER in the last 72 hours. Hemoglobin A1C No results for input(s): HGBA1C in the last 72 hours. Fasting Lipid Panel No results for input(s): CHOL, HDL, LDLCALC, TRIG, CHOLHDL, LDLDIRECT in the last 72 hours. Thyroid Function Tests No results for input(s): TSH, T4TOTAL, T3FREE, THYROIDAB in the last 72 hours.  Invalid input(s): FREET3  Other results:   Imaging  No results found.   Medications:     Scheduled Medications: . acetaZOLAMIDE  250 mg Oral BID  . allopurinol  100 mg Oral BID  . aspirin EC  81 mg Oral Daily  . carvedilol  3.125 mg Oral BID WC  . Chlorhexidine Gluconate Cloth  6 each Topical Daily  . enoxaparin (LOVENOX) injection  40 mg Subcutaneous Q24H  . insulin aspart  0-15 Units Subcutaneous TID WC  . isosorbide-hydrALAZINE  0.5 tablet Oral TID  . pantoprazole  40 mg Oral Daily  . polyethylene glycol  17 g Oral BID  . potassium chloride  40 mEq Oral Once  . potassium chloride  40 mEq Oral TID  . senna-docusate  1 tablet Oral BID  . sodium chloride flush  10-40 mL Intracatheter Q12H  .  torsemide  40 mg Oral BID    Infusions: . sodium chloride Stopped (08/29/20 1823)  . sodium chloride    . milrinone 0.125 mcg/kg/min (09/06/20 1237)  . potassium chloride      PRN Medications: sodium chloride, acetaminophen, lidocaine (PF), ondansetron (ZOFRAN) IV, sodium chloride flush   Assessment/Plan   1. Acute on chronic systolic CHF: Prominent signs of RV failure.  I reviewed 9/21 echo, EF 40-45%, global hypokinesis, D-shaped IV septum suggestive of RV pressure/volume overload, RV poorly visualized but appeared normal in size with at least mild systolic dysfunction. Cause of cardiomyopathy is uncertain.  She has history of COVID-19 1/21, cannot rule out viral myocarditis. Chemotherapy-mediated CMP is possible (not sure what chemo she had remotely for breast cancer).  Cardiac MRI showed LV EF 41% with diffuse hypokinesis, RV mildly dilated but with EF 27% and D-shaped septum, nonspecific RV insertion site LGE.  Suspect RV > LV failure. Cardiac MRI is not suggestive of amyloidosis, no M-spike on myeloma panel.  We have not ruled out CAD.  She has had recurrent HF admissions this year.  Milrinone started primarily for RV support. Diuresed well on lasix gtt. Tolerating milrinone wean ok. Co-ox 85% on 0.125 mcg. CVP 7-9  - Continue torsemide 40 mg bid  - Continue acetazolamide today with HCO3 44.  - Continue to wean milrinone and follow co-ox  - Continue Coreg  3.125 mg bid.  - No ARNI/spironolactone for now with elevated creatinine.  - Continue Bidil 0.5 tab tid.  - RHC today to formally assess filling pressures, PA pressure, and cardiac output. Would hold off on coronary angiography with no chest pain and creatinine 2.2.  - She is a Cardiomems candidate.  - Continue UNNA boots  2. Chronic hypoxemic respiratory failure: Suspect OHS/OSA.  She was using home oxygen prior to COVID-19 PNA.  - Continue supplemental oxygen.   - Will need sleep study as outpatient.  3. Atrial fibrillation:  Chronic, all ECGs since 7/21 show atrial fibrillation.  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 Rate control is reasonable.  - She cannot be cardioverted at this point as she cannot be chronically anticoagulated.  - Consider Watchman device placement, though would need 45 days warfarin and Plavix x 6 months after that.  - No change 4. GI bleeding: Recurrent, from small bowel AVMs. No overt bleeding. Hgb 8.4 today.  - Tsat 22% (ok) - Transfuse hgb < 7.  5. Right pleural effusion: Suspect due to CHF.  Had right thoracentesis with 500 cc off on 9/23, pleural fluid protein suggested exudative though PF LDH did not.   6.  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  7. CKD: Stage IV.  Follow creatinine closely with diuresis, stable at 2.2 today.  8. Thrombocytopenia: Mild, stable.    9. Deconditioning:  - OOB and ambulate w/ PT today  Length of Stay: [place], PA-C  09/07/2020, 7:39 AM  Advanced Heart Failure Team Pager [phone] (M-F; 7a - 4p)  Please contact [facility] for night-coverage after hours (4p -7a ) and weekends on [email or web address]  Patient seen with PA, agree with the above note.   RHC today as below.  Weight now down a total of 30 lbs.  We stopped milrinone this morning.   RHC Procedural Findings: Hemodynamics (mmHg) RA mean 9 RV 56/8 PA 54/17, mean 33 PCWP mean 20 Oxygen saturations: PA 63% AO 98% Cardiac Output (Fick) 6.69  Cardiac Index (Fick) 3.33 PVR 1.9 WU  General: NAD Neck: JVP 8 cm, no thyromegaly or thyroid nodule.  Lungs: Clear to auscultation bilaterally with normal respiratory effort. CV: Nondisplaced PMI.  Heart irregular S1/S2, no S3/S4, no murmur.  Trace ankle edema. Abdomen: Soft, nontender, no hepatosplenomegaly, no distention.  Skin: Intact without lesions or rashes.  Neurologic: Alert and oriented x 3.  Psych: Normal affect. Extremities: No  clubbing or cyanosis.  HEENT: Normal.   Filling pressures remain mildly elevated but weight is down 30 lbs.  I will continue her on acetazolamide 250 mg bid and torsemide 40 mg bid today and follow UOP.  If urine output drops off, will increase torsemide.  She will stay off milrinone.  Likely stop acetazolamide tomorrow.  Continue low doses of Coreg and Bidil. Creatinine remains stable.   No anticoagulation given history of GI bleeding, consider for Watchman in the future.   Would like to eventually get Cardiomems placed.  Possibly home tomorrow if remains stable.   Loralie Champagne 09/07/2020 10:28 AM

## 2020-09-07 NOTE — Progress Notes (Signed)
Subjective:  Rita [name] reports feeling "very good" this morning. States she did not sleep well last night due to staff waking her up. Shortness of breath continues to improve. She is scheduled for right heart cath this morning and has no questions or concerns at this time. No CP, abdominal pain, or headache this morning.  Patient and her husband discussed goals of care with her daughter yesterday which "scared" her daughter. She is amenable to having a family discussion tomorrow to make sure everyone is in agreement.   She is eager to get home as soon as possible and is motivated to modify her behaviors to keep her out of the hospital. We discussed options for reducing sodium intake at home. She is amenable to seeing a dietician in the future to discuss a heart healthy diet in more detail on an outpatient basis.    Objective:  Vital signs in last 24 hours: Vitals:   09/07/20 1009 09/07/20 1014 09/07/20 1019 09/07/20 1220  BP: 132/71 126/63 139/71 127/67  Pulse: 79 73 71 82  Resp: 19 (!) 30 20 (!) 21  Temp:    98.7 F (37.1 C)  TempSrc:    Oral  SpO2: 97% 98% 98%   Weight:      Height:       Weight change: -0.672 kg  Intake/Output Summary (Last 24 hours) at 09/07/2020 1225 Last data filed at 09/07/2020 1222 Gross per 24 hour  Intake 240 ml  Output 3601 ml  Net -3361 ml   Physical Exam Vitals and nursing note reviewed.  Constitutional:      General: She is not in acute distress. Cardiovascular:     Rate and Rhythm: Rhythm irregularly irregular.     Heart sounds: No murmur heard.      Comments: No murmur noted today- a change from previous.  Pulmonary:     Effort: Pulmonary effort is normal. No respiratory distress.     Breath sounds: Examination of the right-lower field reveals decreased breath sounds. Decreased breath sounds present.  Abdominal:     Palpations: Abdomen is soft.     Tenderness: There is no abdominal tenderness.  Musculoskeletal:     Comments: Unna boots  in place. Difficult to assess for lower extremity edema, but seems improved from previous.   Neurological:     Mental Status: She is alert.     Assessment/Plan:  Principal Problem:   Acute exacerbation of congestive heart failure (HCC) Active Problems:   Diabetes mellitus without complication (HCC)   Persistent atrial fibrillation (HCC)   CKD (chronic kidney disease), stage III   Hypertension associated with diabetes ([place])   Morbid obesity (HCC)   Thrombocytopenia (HCC)   Anemia   Acute systolic heart failure (HCC)   Pulmonary hypertension (HCC)  Acute on Chronic Systolic and Diastolic HF: Acute on Chronic Hypoxic Respiratory Failure: Pleural effusion: S/p thora 9/25. Symptoms improved andweight down ~39lb since admissionwith diuresis. Still has some volume on exam, but her JVD is improved today compared to previous.I/O: net -3361yesterday.  Weight change: -0.672 kg Co ox84.8%,stable fromyesterday, CVP7-11today, stable from yesterday.Uncertain etiology of CMP. 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 Cardiac MRI: Not auggestive of amyloidosis.LVEF 41% w diffuse hypokinesia. Septal patterns suggestive of RV.LV pressure and volume overload.Had mildly dilated IVC suggestiveof elevated RV filling pressure. Heart failure team have been kindly following and managing her heart failure.RHC done today with mildly elevated R and L heart filling pressures, pulmonary venous htn, and preserved cardiac  output off milrinone.   Continuing to diurese well on PO regimen. S/p RHC as above. Hopeful for discharge tomorrow if her UO continues to be good. - Torsemide 40mg  PO BID  - Continue Acetazolamide 250mg BID (HCO3 44 today) - Continue Coreg 3.125 mg BID - Milrinone 0.125 mcg/kg/min with plans to wean off per HF team - HoldingARNI/spironolactone given elevated creatinine  - BMP daily - K 3.0 today. Gave patient two runs of IV K and an extra   dose of 40 mEq  PO             - Recheck afternoon BMP   - Continue Klor-Con 40 mEq TID - Strict I/O - Fluid restriction - Cr stable with diuresis - Appreciate HF team care - Weight daily - Outpatient cardiology follow-up  Goals of Care: Patient and her husband have a good understanding of her illness and have a common goal of keeping her out of the hospital and maximizing time together at home even if it means forgoing aggressive interventions.  - Plan to revisit with patient this afternoon. - Will discuss with patient's daughter prior to discharge  Pancytopenia:  Pancytopenia of unknown origin in a patient with a history of cancer,  chemotherapy, and radiation. Some workup done at last admission:  abdominal US without splemomegaly, normal haptoglobin, and peripheral  smear with normocytic anemia and thrombocytopenia. Has ahistory of  small bowel AVM's and reports dark stools 2/2 iron.Peripheral smearwith  thrombocytopenia and circulating NRBCs.Less likely to be hemolytic in  nature. Platelets 80s-110s.Hemoglobinbetween 8-9. Patient has history of  normocytic anemia and takes iron supplements at home.Iron, Ferritin, and  TIBC all WNL.Reticulocyte percentage (1.3%) inappropriately low given her  anemia. Increased immature retic fraction at 19.5%. 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 SPEP with no M spike.  - Patient will likely need outpatient BM biopsy   - Heme-onc follow up   Persistent Atrial Fibrillation: Not on [facility] and also not a candidate for cardioversion. due to Hx of GI bleeding and multiple AVMs.  PerHF team, may considerWatchman device placement, but she will need warfarin x 45 days andneed 45 days and Plavix x 6 months. - Rate remains control on decreased dose of Coreg 3.25 mg BID. - Continue ASA 81mg  daily  - Telemetry  CKD 4: Cr stable w diuresis - BMP daily - Avoid nephrotoxins and contrast as  possible  Uncontrolled Type II DM: Does not check blood sugar at home. Home meds are Metformin 500mg  in the morning only and glipizide 5mg  daily. A1c 7.5.Glucose better controlledtoday in 110-120ss on moderate SSI. - ContinuemoderateSSI with moderate correction coverage - Consideradding empaglaflozin to optimize cardioprotection CBG (last 3)  Recent Labs    09/06/20 2137 09/07/20 0625 09/07/20 1218  GLUCAP 165* 126* 114*    Controlled HTN: Blood pressures well controlled and stable. HF team adjusting HF medications. - Consider adding ACEI/ARB/ARNI for cardioprotective effects, but hold off for now per HF team    LOS: 10 days   Pearla Dubonnet, Medical Student 09/07/2020, 12:25 PM

## 2020-09-07 NOTE — Progress Notes (Signed)
PT Cancellation Note  Patient Details Name: Rita [name] MRN: [id number] DOB: 09-19-1956   Cancelled Treatment:     Transport in room to take pt for cardiac cath, will follow up in PM time permitting  Lyanne Co, DPT Acute Rehabilitation Services [phone]   Kendrick [place] 09/07/2020, 9:54 AM

## 2020-09-07 NOTE — Progress Notes (Signed)
Paged about wide QRS. Tele shows 24 beats NSVT. Patient asymptomatic. Will get state BMET and Mag.

## 2020-09-08 ENCOUNTER — Other Ambulatory Visit (HOSPITAL_COMMUNITY): Payer: Self-pay | Admitting: Cardiology

## 2020-09-08 ENCOUNTER — Other Ambulatory Visit: Payer: Self-pay | Admitting: Student

## 2020-09-08 ENCOUNTER — Encounter (HOSPITAL_COMMUNITY): Payer: Self-pay | Admitting: Cardiology

## 2020-09-08 DIAGNOSIS — I4819 Other persistent atrial fibrillation: Secondary | ICD-10-CM

## 2020-09-08 DIAGNOSIS — Z95828 Presence of other vascular implants and grafts: Secondary | ICD-10-CM

## 2020-09-08 DIAGNOSIS — I472 Ventricular tachycardia: Secondary | ICD-10-CM

## 2020-09-08 LAB — COOXEMETRY PANEL
Carboxyhemoglobin: 1.7 % — ABNORMAL HIGH (ref 0.5–1.5)
Methemoglobin: 1.2 % (ref 0.0–1.5)
O2 Saturation: 69.6 %
Total hemoglobin: 9.4 g/dL — ABNORMAL LOW (ref 12.0–16.0)

## 2020-09-08 LAB — BASIC METABOLIC PANEL
Anion gap: 10 (ref 5–15)
BUN: 36 mg/dL — ABNORMAL HIGH (ref 8–23)
CO2: 43 mmol/L — ABNORMAL HIGH (ref 22–32)
Calcium: 9.2 mg/dL (ref 8.9–10.3)
Chloride: 84 mmol/L — ABNORMAL LOW (ref 98–111)
Creatinine, Ser: 2.3 mg/dL — ABNORMAL HIGH (ref 0.44–1.00)
GFR calc Af Amer: 25 mL/min — ABNORMAL LOW (ref >60)
GFR calc non Af Amer: 21 mL/min — ABNORMAL LOW (ref >60)
Glucose, Bld: 114 mg/dL — ABNORMAL HIGH (ref 70–99)
Potassium: 3.7 mmol/L (ref 3.5–5.1)
Sodium: 137 mmol/L (ref 135–145)

## 2020-09-08 LAB — GLUCOSE, CAPILLARY
Glucose-Capillary: 118 mg/dL — ABNORMAL HIGH (ref 70–99)
Glucose-Capillary: 166 mg/dL — ABNORMAL HIGH (ref 70–99)

## 2020-09-08 MED ORDER — ISOSORB DINITRATE-HYDRALAZINE 20-37.5 MG PO TABS: 0.5000 | ORAL_TABLET | Freq: Three times a day (TID) | ORAL | 0 refills | Status: DC

## 2020-09-08 MED ORDER — POTASSIUM CHLORIDE CRYS ER 20 MEQ PO TBCR: 40.0000 meq | EXTENDED_RELEASE_TABLET | Freq: Two times a day (BID) | ORAL | 0 refills | Status: DC

## 2020-09-08 MED ORDER — TORSEMIDE 20 MG PO TABS: 40.0000 mg | ORAL_TABLET | Freq: Two times a day (BID) | ORAL | 0 refills | Status: DC

## 2020-09-08 MED ORDER — CARVEDILOL 6.25 MG PO TABS: 6.2500 mg | ORAL_TABLET | Freq: Two times a day (BID) | ORAL | 0 refills | Status: DC

## 2020-09-08 NOTE — Discharge Instructions (Signed)
Rita [name], it was a pleasure taking care of you. You were admitted to the hospital for heart failure. You were seen by cardiology, and you were treated with fluid pills to get fluid off of your belly, lungs, and legs. We have made some adjustments to your home medicines. Your heart medicines will be as follows:  Torsemide 40 mg twice daily Bidil 0.5 tab three times daily Coreg 6.25 mg twice daily ASA 81 mg daily KCl 40 mEq twice daily  You will continue on your other home medications as before.  It is VERY IMPORTANT that you follow up with the heart failure doctors here at [facility] 1st floor. You are scheduled to follow-up with them on 09/26/20 at 8:30 am.

## 2020-09-08 NOTE — Progress Notes (Addendum)
Patient ID: Rita [name], female   DOB: Mar 28, 1956, 64 y.o.   MRN: [id number]     Advanced Heart Failure Rounding Note  PCP-Cardiologist: No primary care provider on file.   Subjective:    RIGHT HEART CATH Done Yesterday   RHC Procedural Findings: Hemodynamics (mmHg) RA mean 9 RV 56/8 PA 54/17, mean 33 PCWP mean 20 Oxygen saturations: PA 63% AO 98% Cardiac Output (Fick) 6.69  Cardiac Index (Fick) 3.33 PVR 1.9 WU  Conclusion  1. Mildly elevated right and left heart filling pressures.  2. Pulmonary venous hypertension.  3. Preserved cardiac output, off milrinone. CI 3.3   Remains stable off milrinone today. Co-ox 70%.   Now on PO diuretics, torsemide. -1L in UOP yesterday. Wt continues to trend down. SCr stable at 2.3. CVP 8.   Sitting up in chair. No complaints.    Objective:   Weight Range: 102.4 kg Body mass index is 42.66 kg/m.   Vital Signs:   Temp:  [97.9 F (36.6 C)-99.1 F (37.3 C)] 97.9 F (36.6 C) (10/01 0732) Pulse Rate:  [66-88] 81 (10/01 0000) Resp:  [11-30] 21 (10/01 0000) BP: (110-139)/(45-71) 113/65 (10/01 0732) SpO2:  [96 %-99 %] 97 % (10/01 0000) Weight:  [102.4 kg] 102.4 kg (10/01 0454) Last BM Date: 09/04/20  Weight change: Filed Weights   09/06/20 0400 09/07/20 0600 09/08/20 0454  Weight: 104.9 kg 104.2 kg 102.4 kg    Intake/Output:   Intake/Output Summary (Last 24 hours) at 09/08/2020 0947 Last data filed at 09/08/2020 0902 Gross per 24 hour  Intake 689.44 ml  Output 1550 ml  Net -860.56 ml      Physical Exam    CVP 8 General:  Obese female laying sitting up in bed. No respiratory difficulty HEENT: normal Neck: supple. JVD ~8 cm. Carotids 2+ bilat; no bruits. No lymphadenopathy or thyromegaly appreciated. Cor: PMI nondisplaced. Irregularly irregular rhythm, regular rate. 2/6 RUSB murmur  Lungs: clear. No wheezing  Abdomen: obese, soft, nontender, nondistended. No hepatosplenomegaly. No bruits or masses. Good  bowel sounds. Extremities: no cyanosis, clubbing, rash, trace edema, + bilateral unna boots  Neuro: alert & oriented x 3, cranial nerves grossly intact. moves all 4 extremities w/o difficulty. Affect pleasant.  Telemetry   Atrial fibrillation, 70s-80s, 24 beat run of NSVT yesterday (personally reviewed)  Labs    CBC Recent Labs    09/07/20 0500 09/07/20 0500 09/07/20 1015 09/08/20 0423  WBC 3.9*  --   --  3.8*  HGB 8.4*   < > 11.6*  11.2* 9.0*  HCT 28.1*   < > 34.0*  33.0* 29.5*  MCV 86.5  --   --  85.5  PLT 117*  --   --  121*   < > = values in this interval not displayed.   Basic Metabolic Panel Recent Labs    09/07/20 0500 09/07/20 1015 09/07/20 2004 09/08/20 0423  NA 137   < > 135 137  K 3.0*   < > 3.5 3.7  CL 82*   < > 81* 84*  CO2 44*   < > 42* 43*  GLUCOSE 139*   < > 191* 114*  BUN 33*   < > 36* 36*  CREATININE 2.28*   < > 2.29* 2.30*  CALCIUM 9.2   < > 9.3 9.2  MG 2.4  --  2.5*  --   PHOS 4.4  --   --   --    < > = values in this  interval not displayed.   Liver Function Tests Recent Labs    09/06/20 0524  AST 27  ALT 13  ALKPHOS 80  BILITOT 2.6*  PROT 8.0  ALBUMIN 3.4*   No results for input(s): LIPASE, AMYLASE in the last 72 hours. Cardiac Enzymes No results for input(s): CKTOTAL, CKMB, CKMBINDEX, TROPONINI in the last 72 hours.  BNP: BNP (last 3 results) Recent Labs    06/27/20 0141 07/19/20 1752 08/28/20 0455  BNP 723.7* 620.4* 673.5*    ProBNP (last 3 results) No results for input(s): PROBNP in the last 8760 hours.   D-Dimer No results for input(s): DDIMER in the last 72 hours. Hemoglobin A1C No results for input(s): HGBA1C in the last 72 hours. Fasting Lipid Panel No results for input(s): CHOL, HDL, LDLCALC, TRIG, CHOLHDL, LDLDIRECT in the last 72 hours. Thyroid Function Tests No results for input(s): TSH, T4TOTAL, T3FREE, THYROIDAB in the last 72 hours.  Invalid input(s): FREET3  Other results:   Imaging     CARDIAC CATHETERIZATION  Result Date: 09/07/2020 1. Mildly elevated right and left heart filling pressures. 2. Pulmonary venous hypertension. 3. Preserved cardiac output, off milrinone.     Medications:     Scheduled Medications: . allopurinol  100 mg Oral BID  . aspirin EC  81 mg Oral Daily  . carvedilol  3.125 mg Oral BID WC  . Chlorhexidine Gluconate Cloth  6 each Topical Daily  . enoxaparin (LOVENOX) injection  40 mg Subcutaneous Q24H  . insulin aspart  0-15 Units Subcutaneous TID WC  . isosorbide-hydrALAZINE  0.5 tablet Oral TID  . pantoprazole  40 mg Oral Daily  . polyethylene glycol  17 g Oral BID  . potassium chloride  40 mEq Oral TID  . senna-docusate  1 tablet Oral BID  . sodium chloride flush  10-40 mL Intracatheter Q12H  . torsemide  40 mg Oral BID    Infusions: . sodium chloride Stopped (08/29/20 1823)  . sodium chloride    . milrinone Stopped (09/07/20 1004)    PRN Medications: sodium chloride, acetaminophen, lidocaine (PF), ondansetron (ZOFRAN) IV, sodium chloride flush   Assessment/Plan   1. Acute on chronic systolic CHF: Prominent signs of RV failure.  I reviewed 9/21 echo, EF 40-45%, global hypokinesis, D-shaped IV septum suggestive of RV pressure/volume overload, RV poorly visualized but appeared normal in size with at least mild systolic dysfunction. Cause of cardiomyopathy is uncertain.  She has history of COVID-19 1/21, cannot rule out viral myocarditis. Chemotherapy-mediated CMP is possible (not sure what chemo she had remotely for breast cancer).  Cardiac MRI showed LV EF 41% with diffuse hypokinesis, RV mildly dilated but with EF 27% and D-shaped septum, nonspecific RV insertion site LGE.  Suspect RV > LV failure. Cardiac MRI is not suggestive of amyloidosis, no M-spike on myeloma panel.  We have not ruled out CAD.  She has had recurrent HF admissions this year.  Milrinone started primarily for RV support. Diuresed well on lasix gtt. RHC yesterday  w/ mildly elevated right and left heart filling pressures and preserved CO.  Now off milrinone. Co-ox stable at 70%. CVP 8.   - Continue torsemide 40 mg bid   - Increase Coreg to 6.25 mg bid.  - No ARNI/spironolactone for now with elevated creatinine.  - Continue Bidil 0.5 tab tid.  - She is a Cardiomems candidate. Will start approval process. 2. Chronic hypoxemic respiratory failure: Suspect OHS/OSA.  She was using home oxygen prior to COVID-19 PNA.  - Continue  supplemental oxygen.   - Will need sleep study as outpatient.  3. Atrial fibrillation: Chronic, all ECGs since 7/21 show atrial fibrillation.  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 Rate control is reasonable.  - She cannot be cardioverted at this point as she cannot be chronically anticoagulated.  - Consider Watchman device placement, though would need 45 days warfarin and Plavix x 6 months after that. We will place EP referal - No change 4. GI bleeding: Recurrent, from small bowel AVMs. No overt bleeding. Hgb 9.0 today.  - Tsat 22% (ok) 5. Right pleural effusion: Suspect due to CHF.  Had right thoracentesis with 500 cc off on 9/23, pleural fluid protein suggested exudative though PF LDH did not.   6. 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  7. CKD: Stage IV.  Follow creatinine closely with diuresis, stable at 2.3 today.  8. Thrombocytopenia: Mild, stable.    9. Deconditioning:  - PT has recommended HHPT   Stable for d/c home today. We will arrange [facility] F/u and will put info in AVS.    Length of Stay: [address] PA-C  09/08/2020, 9:47 AM  Advanced Heart Failure Team Pager [phone] (M-F; 7a - 4p)  Please contact [facility] for night-coverage after hours (4p -7a ) and weekends on [email or web address]  Patient seen with PA, agree with the above note.  She is doing well today, co-ox 70% off milrinone with CVP 8.  Creatinine stable  at 2.3.  General: NAD Neck: JVP 8 cm, no thyromegaly or thyroid nodule.  Lungs: Clear to auscultation bilaterally with normal respiratory effort. CV: Nondisplaced PMI.  Heart regular S1/S2, no S3/S4, 2/6 early SEM RUSB.  No peripheral edema.  Abdomen: Soft, nontender, no hepatosplenomegaly, no distention.  Skin: Intact without lesions or rashes.  Neurologic: Alert and oriented x 3.  Psych: Normal affect. Extremities: No clubbing or cyanosis.  HEENT: Normal.   Feeling better, weight down 35 lbs total and creatinine stable.  Think she can go home today.    Meds for discharge: torsemide 40 mg bid, KCl 40 mEq bid, Bidil 0.5 tab tid, Coreg 6.25 mg bid, ASA 81 mg daily.  Needs close followup in CHF clinic.  Recommend consideration for Cardiomems and Watchman as outpatient.   Loralie Champagne 09/08/2020 10:21 AM

## 2020-09-08 NOTE — Progress Notes (Signed)
Subjective:  Rita [name] states that she feels well this morning and is looking forward to going home. Her breathing and edema continue to improve with diuresis. She denies any abdominal pain, chest pain, or headache at this time.  We discussed the importance of adhering to her medication regimen and close follow-up with heart failure team on an outpatient basis. She feels she has strong family support who can help her with her goal of staying out of the hospital. She would like [place] to call her daughter this afternoon to discuss her goals of care and our plans for discharge.  Discussed goals of care with the patient's daughter, who understands the extent of her illness and agrees with the patient's stated goal of maximizing time at home.    Objective:  Vital signs in last 24 hours: Vitals:   09/07/20 1922 09/07/20 2000 09/08/20 0000 09/08/20 0454  BP:  (!) 114/56 (!) 110/45 (!) 113/51  Pulse:  66 81   Resp:  16 (!) 21   Temp: 98.2 F (36.8 C) 98 F (36.7 C) 98.3 F (36.8 C) 99.1 F (37.3 C)  TempSrc: Oral Oral Oral   SpO2:  96% 97%   Weight:    102.4 kg  Height:       Weight change: -1.778 kg  Intake/Output Summary (Last 24 hours) at 09/08/2020 0642 Last data filed at 09/07/2020 1400 Gross per 24 hour  Intake 469.44 ml  Output 1000 ml  Net -530.56 ml   Physical Exam Vitals and nursing note reviewed.  Constitutional:      Interventions: Nasal cannula in place.     Comments: 2L nasal cannula  Cardiovascular:     Rate and Rhythm: Normal rate. Rhythm irregularly irregular.     Heart sounds: No murmur heard.  No friction rub. No gallop.      Comments: Unna boots in place, difficult to assess lower extremity edema but appears improved from previous.  Pulmonary:     Effort: Pulmonary effort is normal. No respiratory distress.  Abdominal:     Palpations: Abdomen is soft.     Tenderness: There is no abdominal tenderness.     Assessment/Plan:  Principal Problem:   Acute  exacerbation of congestive heart failure (HCC) Active Problems:   Diabetes mellitus without complication (HCC)   Persistent atrial fibrillation (HCC)   CKD (chronic kidney disease), stage III (HCC)   Hypertension associated with diabetes ([place])   Morbid obesity (HCC)   Thrombocytopenia (HCC)   Anemia   Acute systolic heart failure (HCC)   Pulmonary hypertension (HCC)  Acute on Chronic Systolic and Diastolic HF: Acute on Chronic Hypoxic Respiratory Failure: Pleural effusion s/p thoracentesis 9/25: Symptoms improved and weight down ~35 pounds since admission with aggressive diureses. Diuresis slowing down as she appears to be reaching her dry weight. Volume status somewhat difficult to assess due to body habitus, but much improved since admission. I/O net -530.56 yesterday.   Weight change: -1.778 kg  Co-Ox 70% today off of milrinone. CVP 8. Creatinine remains stable around 2.3. Cardiac MRI: Not auggestive of amyloidosis.LVEF 41% w diffuse hypokinesia. Septal patterns suggestive of RV.LV pressure and volume overload.Had mildly dilated IVC suggestiveof elevated RV filling pressure.RHC with mildly elevated R and L heart filling pressures, pulmonary venous htn, and preserved cardiac output off milrinone.   Patient appears to be near her dry weight and is tolerating PO meds well. Stable for disacharge.  - Discharge with the following meds per HF team:  - Torsemide  31m BID  - KCl 40 mEq BID  - Bidil 0.5 tab TID  - Coreg 6.25 mg BID  - ASA 81 mg - Close follow-up with HF team as an outpatient.  - HF team to consider Cardiomems and Watchman devices   Asymptomatic Non-Sustained Ventricular Tachycardic: Patient with known structural heart disease with a run of 23 beats of polymorphic V-Tach yesterday evening. Asymptomatic. Given her known history of structural heart disease and thorough cardiac workup already this admission, this does not warrant further workup at this time. Also note that  she has been on a decreased dose of her B-blocker for the past several days.  - Coreg 6.25 mg BID as above.   Goals of Care: Patient and her husband have a good understanding of her illness and have a common goal of keeping her out of the hospital and maximizing time together at home even if it means forgoing aggressive interventions. Discussed goals of care with patient's daughter who is in agreement.  Pancytopenia of uncertain etiology: Patient with a history of cancer, chemotherapy, and radiation. Some workup done last admission: abdominal [place]ore[place]ithout splenomegaly, nl haptoglobin, peripheral smear with normocytic anemia and thrombocytopenia. Has a hx of small bowel AVMs and reports dark stools 2/2 iron supplementation. Peripheral smear this admission with thrombocytopenia and circulating NRBCs. Platelets 80s-110s. Hemoglobin 8-9. Iron, Ferritin, and TIBC all WNL. Retic percentage (1.3) inappropriately low given her chronic anemia. Increased immature retic fraction at 19.5%. 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 UPEP unremarkable. SPEP with no M spike.  - Patient will likely need outpatient bone marrow biopsy - Recommend heme-onc follow-up   Persistent Atrial Fibrillation: Not on anticoagulation and not a candidate for cardioversion due to Hx of GI bleeding from multiple AVMs. Per HF team, may consider Watchman device in the future, but would require warfaring x45 days and Plavix x6 months. - Rate controlled on Coreg - ASA 81 mg daily  CKD IV: Creatinine stable w/ diuresis  DM2: Does not check blood sugar at home. Home meds are Metformin 5041min the morning only and glipizide 69m59maily. Controlled on moderate SSI while in hospital. - Restart home meds upon discharge. - May consider adding empaglaflozin to optimize cardioprotection on an outpatient basis  HTN: Well controlled and stable. - Consider adding ACEI/ARB/ARNI for cardioprotective benefits on an  outpatient basis    LOS: 11 days   SanPearla Dubonnetedical Student 09/08/2020, 6:42 AM

## 2020-09-08 NOTE — Progress Notes (Signed)
Per Rita [name] Patient will need referral to AFIB clinic for watchman device   Referral placed

## 2020-09-08 NOTE — TOC Transition Note (Signed)
Transition of Care [facility]) - CM/SW Discharge Note   Patient Details  Name: Hannie Shoe MRN: [id number] Date of Birth: Aug 05, 1956  Transition of Care [facility]) CM/SW Contact:  Zenon Mayo, RN Phone Number: 09/08/2020, 3:05 PM   Clinical Narrative:    Patient is for dc today, NCM notified Michael with [facility] of dc today.  Final next level of care: [place] Barriers to Discharge: No Barriers Identified   Patient Goals and CMS Choice Patient states their goals for this hospitalization and ongoing recovery are:: get better CMS Medicare.gov Compare Post Acute Care list provided to:: Patient Choice offered to / list presented to : Patient  Discharge Placement                       Discharge Plan and Services   Discharge Planning Services: CM Consult Post Acute Care Choice: Home Health            DME Agency: NA       HH Arranged: RN, Disease Management, PT,OT [place] Agency: [facility] [employer]) Date [place]: 08/30/20 Time [place]: 1044 Representative spoke with at [place]: [place] ([place]) Interventions     Readmission Risk Interventions Readmission Risk Prevention Plan 08/30/2020 07/25/2020  Transportation Screening Complete Complete  PCP or Specialist Appt within 3-5 Days Complete Complete  HRI or [place] Complete Complete  Social Work Consult for [place] Planning/Counseling Complete Complete  Palliative Care Screening Not Applicable Not Applicable  Medication Review Press photographer) Complete Complete  Some recent data might be hidden

## 2020-09-08 NOTE — Discharge Summary (Addendum)
Name: Rita [name] MRN: [id number] DOB: [birth year] 64 y.o. PCP: [name], MD  Date of Admission: 08/27/2020  4:48 PM Date of Discharge: 09/08/2020 Attending Physician: Dr. Rebeca Alert  Discharge Diagnosis: 1. Acute on chronic systolic CHF 2. Right pleural effusion 3. Pancytopenia of uncertain etiology 4. Atrial fibrillation 5. CKD Stage IV  Discharge Medications: Allergies as of 09/08/2020       Reactions   Codeine Other (See Comments)   Mouth sores    Prednisone Other (See Comments)   Delusions    Moxifloxacin Rash        Medication List     TAKE these medications    acetaminophen 325 MG tablet Commonly known as: TYLENOL Take 650 mg by mouth every 6 (six) hours as needed for mild pain or headache.   allopurinol 100 MG tablet Commonly known as: ZYLOPRIM Take 100 mg by mouth 2 (two) times daily.   aspirin EC 81 MG tablet Take 81 mg by mouth daily. Swallow whole.   blood glucose meter kit and supplies Dispense based on patient and insurance preference. Use up to four times daily as directed. (FOR ICD-10 E10.9, E11.9).   carvedilol 6.25 MG tablet Commonly known as: COREG Take 1 tablet (6.25 mg total) by mouth 2 (two) times daily with a meal. What changed:  medication strength how much to take   ferrous sulfate 325 (65 FE) MG tablet Take 325 mg by mouth 2 (two) times daily with a meal.   glipiZIDE 5 MG 24 hr tablet Commonly known as: GLUCOTROL XL Take 5 mg by mouth daily with breakfast.   isosorbide-hydrALAZINE 20-37.5 MG tablet Commonly known as: BIDIL Take 0.5 tablets by mouth 3 (three) times daily.   metFORMIN 500 MG 24 hr tablet Commonly known as: GLUCOPHAGE-XR Take 500 mg by mouth daily with breakfast.   multivitamin with minerals Tabs tablet Take 2 tablets by mouth daily. Alive gummy bears   Muscle Rub 10-15 % Crea Apply 1 application topically as needed for muscle pain (knees).   OXYGEN Inhale 2 L into the lungs  continuous.   pantoprazole 40 MG tablet Commonly known as: PROTONIX Take 40 mg by mouth daily.   potassium chloride SA 20 MEQ tablet Commonly known as: KLOR-CON Take 2 tablets (40 mEq total) by mouth 2 (two) times daily. What changed:  how much to take when to take this   torsemide 20 MG tablet Commonly known as: DEMADEX Take 2 tablets (40 mg total) by mouth 2 (two) times daily. What changed:  how much to take when to take this        Disposition and follow-up:   Rita [name] was discharged from [facility] in Good condition.  At the hospital follow up visit please address:  HFrEF with chronic respiratory failure, predominantly right heart failure: Patient to follow with [facility] Failure team as an outpatient for medication management. Patient weighs herself daily and has made efforts to improve diet. Please provide education on heart healthy diet and home medication titration based on daily weights. She should have a sleep study and use CPAP if indicated.   Pancytopenia: See workup completed in hospital course below. Likely that patient will require a bone marrow biopsy. Consider outpatient heme-onc referral.   DM, HTN: Rita [name] may benefit from the addition of an SGLT2 and an ACE/ARB/ARNI to Rita home medications in consultation with nephrology  2.  Labs / imaging needed at time of follow-up: BMP,  CBC  3.  Pending labs/ test needing follow-up: none  Follow-up Appointments:  Follow-up Information     [facility] Follow up.   Specialty: Home Health Services Why: HHRN, HHPT, [place] Contact information: [address] [place] [address] [phone]         [place] On 09/26/2020.   Specialty: Cardiology Why: 8:30 AM Advanced [place] Code 3009 Contact information: [address] [id number] mc [address] [place]  [place]        [name], MD.   Specialty: Internal Medicine Why: Please follow up in a week Contact information: [address] [place] [address] [place] by problem list:  Acute on Chronic HFrEF with acute on chronic respiratory failure: Rita [name] presented to the [facility] on [facility] with 3-4 days of worsening shortness of breath, increasing weight, and lower extremity edema. BNP was 674. She was found to have a new-onset 2/6 systolic murmur. Echo showed no valvular pathology and an LVEF of 40-45% and elevated PA systolic pressure of 51 mmHg. She was found to have a large R sided pleural effusion on CXR that was treated with a therapeutic thoracentesis. Laboratory evaluation of the effusion found that it was transudative in nature. Cardiac MRI was not suggestive of amyloidosis or other infiltrative process but did show severe RV systolic dysfunction. The heart failure team was consulted and she was diuresed with IV furosemide gtt, metolazone, acetazolamide, and a milrinone gtt with a net recorded output of 12.1 liters and a 39 pound weight loss. Rita dyspnea, abdominal and LE edema were all much improved. Right heart catheterization toward the end of Rita hospital course was consistent with R>L heart failure and pulmonary venous hypertension. She was discharged home with the following medication recs per cardiology:   Torsemide 40 mg twice daily Bidil 0.5 tab three times daily Coreg 6.25 mg twice daily ASA 81 mg daily KCl 40 mEq twice daily  She should follow up with the heart failure team for medication adjustment and consideration of the Cardiomems device  Chronic Afib: Paroxysmal polymorphic ventricular tachycardia:  She remained in Afib throughout the hospitalization aside from one recorded instance of non-sustained, polymorphic ventricular tachycardia toward the end of Rita hospitalization. She had been on a decreased  dose of Rita home beta blocker for several days at this point. In the setting of Rita known structural heart disease, it was determined that no further workup was necessary and that she would be discharged to continue carvedilol. Heart failure team recommended consideration of a Watchman device as she is not a candidate for anticoagulation at this time due to recurrent GI bleeding.  Chronic normocytic anemia: Pancytopenia:  Rita [name] CBCs were consistent with pancytopenia. Given Rita history of cancer, chemotherapy, and radiation, limited hospital workup was performed including a peripheral smear with thrombocytopenia and circulating NRBCs. Reticulocyte percentage (1.3%) was found to be inappropriately low given Rita chronic anemia. 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 UPEP was unremarkable and SPEP showed no M spike. The benefit of a bone marrow biopsy was discussed, but it was determined that this would be best handled on an outpatient basis.   Goals of Care: Rita [name] and Rita [name] have a good understanding of Rita illness and have a common goal of keeping Rita out of the hospital  and maximizing time together at home even if it means forgoing aggressive interventions. Discussed goals of care with Rita [name] who is in agreement. Recommend ongoing discussions of goals of care and consideration of an advanced care plan.   Discharge Vitals:   BP 113/65 (BP Location: Left Arm)   Pulse 81   Temp 97.9 F (36.6 C) (Oral)   Resp (!) 21   Ht $R'5\' 1"'Gt$  (1.549 m)   Wt 102.4 kg   SpO2 97%   BMI 42.66 kg/m   Pertinent Labs, Studies, and Procedures:   08/28/20 Echo:  IMPRESSIONS    1. Left ventricular ejection fraction, by estimation, is 40 to 45%. The  left ventricle has mildly decreased function. The left ventricle  demonstrates global hypokinesis. Left ventricular diastolic function could  not be evaluated. There is the  interventricular septum is flattened in  systole and diastole, consistent  with right ventricular pressure and volume overload.   2. Right ventricular systolic function is normal. The right ventricular  size is normal. There is moderately elevated pulmonary artery systolic  pressure. The estimated right ventricular systolic pressure is 16.1 mmHg.   3. The mitral valve is normal in structure. Trivial mitral valve  regurgitation. No evidence of mitral stenosis.   4. The aortic valve is tricuspid. Aortic valve regurgitation is not  visualized. Mild to moderate aortic valve sclerosis/calcification is  present, without any evidence of aortic stenosis.   5. The inferior vena cava is normal in size with <50% respiratory  variability, suggesting right atrial pressure of 8 mmHg.   08/30/20 Cardiac MRI  IMPRESSION: 1.  Normal LV size with EF 41%, diffuse hypokinesis.   2. Mildly dilated RV with severe systolic dysfunction, EF 09%. D-shaped septum suggests RV pressure/volume overload.   3.  Markedly dilated IVC suggestive of elevated RV filling pressure.   4. Nonspecific RV insertion site LGE, this suggests LV pressure/volume overload.   This study is suggestive of R > L heart failure.  09/07/20 Right heart cath:  1. Mildly elevated right and left heart filling pressures.  2. Pulmonary venous hypertension.  3. Preserved cardiac output, off milrinone.   Hemodynamics (mmHg) RA mean 9 RV 56/8 PA 54/17, mean 33 PCWP mean 20 Oxygen saturations: PA 63% AO 98% Cardiac Output (Fick) 6.69  Cardiac Index (Fick) 3.33 PVR 1.9 WU  Discharge Instructions: Discharge Instructions     Diet - low sodium heart healthy   Complete by: As directed    Discharge instructions   Complete by: As directed    Ms. Giaimo, it was a pleasure taking care of you. You were admitted to the hospital for heart failure. You were seen by cardiology, and you were treated with fluid pills to get fluid off of your belly, lungs, and legs. We have made some  adjustments to your home medicines. Your heart medicines will be as follows:  Torsemide 40 mg twice daily Bidil 0.5 tab three times daily Coreg 6.25 mg twice daily ASA 81 mg daily KCl 40 mEq twice daily  You will continue on your other home medications as before.  It is VERY IMPORTANT that you follow up with the heart failure doctors here at [facility] 1st floor. You are scheduled to follow-up with them on 09/26/20 at 8:30 am. You should also follow-up with your primary care provider.       Signed: Alexandria Lodge, MD 09/08/2020, 5:15 PM   Pager: [phone]

## 2020-09-09 DIAGNOSIS — Z9981 Dependence on supplemental oxygen: Secondary | ICD-10-CM | POA: Diagnosis not present

## 2020-09-09 DIAGNOSIS — Z7982 Long term (current) use of aspirin: Secondary | ICD-10-CM | POA: Diagnosis not present

## 2020-09-09 DIAGNOSIS — I130 Hypertensive heart and chronic kidney disease with heart failure and stage 1 through stage 4 chronic kidney disease, or unspecified chronic kidney disease: Secondary | ICD-10-CM | POA: Diagnosis not present

## 2020-09-09 DIAGNOSIS — E1122 Type 2 diabetes mellitus with diabetic chronic kidney disease: Secondary | ICD-10-CM | POA: Diagnosis not present

## 2020-09-09 DIAGNOSIS — J9621 Acute and chronic respiratory failure with hypoxia: Secondary | ICD-10-CM | POA: Diagnosis not present

## 2020-09-09 DIAGNOSIS — Z7984 Long term (current) use of oral hypoglycemic drugs: Secondary | ICD-10-CM | POA: Diagnosis not present

## 2020-09-09 DIAGNOSIS — D631 Anemia in chronic kidney disease: Secondary | ICD-10-CM | POA: Diagnosis not present

## 2020-09-09 DIAGNOSIS — I472 Ventricular tachycardia: Secondary | ICD-10-CM | POA: Diagnosis not present

## 2020-09-09 DIAGNOSIS — M109 Gout, unspecified: Secondary | ICD-10-CM | POA: Diagnosis not present

## 2020-09-09 DIAGNOSIS — M170 Bilateral primary osteoarthritis of knee: Secondary | ICD-10-CM | POA: Diagnosis not present

## 2020-09-09 DIAGNOSIS — I5043 Acute on chronic combined systolic (congestive) and diastolic (congestive) heart failure: Secondary | ICD-10-CM | POA: Diagnosis not present

## 2020-09-09 DIAGNOSIS — I4819 Other persistent atrial fibrillation: Secondary | ICD-10-CM | POA: Diagnosis not present

## 2020-09-09 DIAGNOSIS — D61818 Other pancytopenia: Secondary | ICD-10-CM | POA: Diagnosis not present

## 2020-09-09 DIAGNOSIS — Z8616 Personal history of COVID-19: Secondary | ICD-10-CM | POA: Diagnosis not present

## 2020-09-09 DIAGNOSIS — Z853 Personal history of malignant neoplasm of breast: Secondary | ICD-10-CM | POA: Diagnosis not present

## 2020-09-09 DIAGNOSIS — N184 Chronic kidney disease, stage 4 (severe): Secondary | ICD-10-CM | POA: Diagnosis not present

## 2020-09-09 DIAGNOSIS — D696 Thrombocytopenia, unspecified: Secondary | ICD-10-CM | POA: Diagnosis not present

## 2020-09-09 LAB — CBC
HCT: 29.5 % — ABNORMAL LOW (ref 36.0–46.0)
Hemoglobin: 9 g/dL — ABNORMAL LOW (ref 12.0–15.0)
MCH: 26.1 pg (ref 26.0–34.0)
MCHC: 30.5 g/dL (ref 30.0–36.0)
MCV: 85.5 fL (ref 80.0–100.0)
Platelets: 121 10*3/uL — ABNORMAL LOW (ref 150–400)
RBC: 3.45 MIL/uL — ABNORMAL LOW (ref 3.87–5.11)
RDW: 18.9 % — ABNORMAL HIGH (ref 11.5–15.5)
WBC: 3.8 10*3/uL — ABNORMAL LOW (ref 4.0–10.5)
nRBC: 0 % (ref 0.0–0.2)

## 2020-09-10 ENCOUNTER — Other Ambulatory Visit: Payer: Self-pay | Admitting: Student

## 2020-09-11 DIAGNOSIS — I509 Heart failure, unspecified: Secondary | ICD-10-CM | POA: Diagnosis not present

## 2020-09-15 ENCOUNTER — Telehealth (HOSPITAL_COMMUNITY): Payer: Self-pay | Admitting: Licensed Clinical Social Worker

## 2020-09-15 NOTE — Telephone Encounter (Signed)
CSW consulted to help pt with transportation to upcoming appts.  CSW called pt to discuss as she has appt next week with [facility] and 10/19 with [facility].   Pt states she should be ok to get to both of those appts and her husband plans to drive her.  CSW had pt write down my number in case anything comes up and she needs assistance.  Will continue to follow and assist as needed  Jorge Ny, [facility] Desk#: [phone] Cell#: [phone]

## 2020-09-20 ENCOUNTER — Ambulatory Visit (HOSPITAL_COMMUNITY)
Admission: RE | Admit: 2020-09-20 | Discharge: 2020-09-20 | Disposition: A | Discharge status: Home / Self Care | Payer: PPO | Source: Ambulatory Visit | Attending: Physician Assistant | Admitting: Physician Assistant

## 2020-09-20 ENCOUNTER — Other Ambulatory Visit: Payer: Self-pay

## 2020-09-20 VITALS — BP 154/64 | HR 67 | Ht 61.0 in | Wt 220.8 lb

## 2020-09-20 DIAGNOSIS — D6869 Other thrombophilia: Secondary | ICD-10-CM

## 2020-09-20 DIAGNOSIS — I5042 Chronic combined systolic (congestive) and diastolic (congestive) heart failure: Secondary | ICD-10-CM | POA: Insufficient documentation

## 2020-09-20 DIAGNOSIS — Z8616 Personal history of COVID-19: Secondary | ICD-10-CM | POA: Diagnosis not present

## 2020-09-20 DIAGNOSIS — D539 Nutritional anemia, unspecified: Secondary | ICD-10-CM | POA: Diagnosis not present

## 2020-09-20 DIAGNOSIS — Z6841 Body Mass Index (BMI) 40.0 and over, adult: Secondary | ICD-10-CM | POA: Diagnosis not present

## 2020-09-20 DIAGNOSIS — I130 Hypertensive heart and chronic kidney disease with heart failure and stage 1 through stage 4 chronic kidney disease, or unspecified chronic kidney disease: Secondary | ICD-10-CM | POA: Diagnosis not present

## 2020-09-20 DIAGNOSIS — J9611 Chronic respiratory failure with hypoxia: Secondary | ICD-10-CM | POA: Insufficient documentation

## 2020-09-20 DIAGNOSIS — Z7984 Long term (current) use of oral hypoglycemic drugs: Secondary | ICD-10-CM | POA: Insufficient documentation

## 2020-09-20 DIAGNOSIS — N183 Chronic kidney disease, stage 3 unspecified: Secondary | ICD-10-CM | POA: Diagnosis not present

## 2020-09-20 DIAGNOSIS — K219 Gastro-esophageal reflux disease without esophagitis: Secondary | ICD-10-CM | POA: Diagnosis not present

## 2020-09-20 DIAGNOSIS — I4819 Other persistent atrial fibrillation: Secondary | ICD-10-CM

## 2020-09-20 DIAGNOSIS — E669 Obesity, unspecified: Secondary | ICD-10-CM | POA: Insufficient documentation

## 2020-09-20 DIAGNOSIS — Z7982 Long term (current) use of aspirin: Secondary | ICD-10-CM | POA: Diagnosis not present

## 2020-09-20 DIAGNOSIS — Z79899 Other long term (current) drug therapy: Secondary | ICD-10-CM | POA: Insufficient documentation

## 2020-09-20 DIAGNOSIS — E1122 Type 2 diabetes mellitus with diabetic chronic kidney disease: Secondary | ICD-10-CM | POA: Insufficient documentation

## 2020-09-20 NOTE — Progress Notes (Signed)
Primary Care Physician: [name], MD Primary Cardiologist: Dr McLean/Dr Beatrix Fetters [facility] Primary Electrophysiologist: none Referring Physician: Dr Jairo Ben Ollis is a 64 y.o. 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 She has had multiple hospitalizations 2/2 GI bleeding and anemia requiring several transfusions. 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 Xarelto was discontinued at that point. Her GI doctor is Dr Nehemiah Settle in [place].   She was readmitted twice in 7/21 for CHF exacerbations complicated by pleural effusion requiring IR guided thoracentesis.  She was readmitted again 8/21 and 08/28/20 for recurrent CHF and diuresed with IV Lasix.  NYHA class IIIb. Chest x-ray demonstrated cardiomegaly and pulmonary venous congestion with recurrent right-sided pleural effusion.  BNP 673.  CBC shows chronic anemia, hemoglobin 8.9.  Also with worsening thrombocytopenia.  Platelets down from 122K last month down to 84K today. Echo repeated. EF 40-45%. RV ok. RVSP 51 mmHg. She was diuresed and lost ~35 lbs.  Today, she is feeling much better with no symptoms of fluid overload. She denies any awareness of afib. Her SOB is at baseline.   Today, she denies symptoms of palpitations, chest pain, orthopnea, PND, lower extremity edema, dizziness, presyncope, syncope,  snoring, daytime somnolence, bleeding, or neurologic sequela. The patient is tolerating medications without difficulties and is otherwise without complaint today.    Atrial Fibrillation Risk Factors:  she does have symptoms or diagnosis of sleep apnea. she does not have a history of rheumatic fever.   she has a BMI of Body mass index is 41.72 kg/m.Marland Kitchen Filed Weights   09/20/20 1123  Weight: 100.2 kg    Family History  Problem Relation Age of Onset   Cancer Mother      Atrial Fibrillation Management history:  Previous antiarrhythmic drugs: none Previous cardioversions: none Previous ablations: none CHADS2VASC score: 5 Anticoagulation history: warfarin, Xarelto   Past Medical History:  Diagnosis Date   Acute hypoxemic respiratory failure due to COVID-19 ([place]) 12/30/2019   Anemia    Arthritis    Atrial fibrillation (HCC)    Chronic kidney disease    Chronic respiratory failure with hypoxia, on home O2 therapy ([place])    CKD (chronic kidney disease), stage III ([place])    COVID-19    Diabetes mellitus without complication (HCC)    Diastolic CHF ([place])    GI bleed    GI bleed    Gout    Hypertension    Thrombocytopenia ([place])    Past Surgical History:  Procedure Laterality Date   HERNIA REPAIR  02/2019   IR THORACENTESIS ASP PLEURAL SPACE W/IMG GUIDE  06/28/2020   IR THORACENTESIS ASP PLEURAL SPACE W/IMG GUIDE  08/31/2020   RIGHT HEART CATH N/A 09/07/2020   Procedure: RIGHT HEART CATH;  Surgeon: Larey Dresser, MD;  Location: [facility];  Service: Cardiovascular;  Laterality: N/A;    Current Outpatient Medications  Medication Sig Dispense Refill  TYLENOL) 325 MG tablet Take 650 mg by mouth every 6 (six) hours as needed for mild pain or headache.    °• allopurinol (ZYLOPRIM) 100 MG tablet Take 100 mg by mouth 2 (two) times daily.    °• aspirin EC 81 MG tablet Take 81 mg by mouth daily. Swallow whole.    °• blood glucose meter kit  and supplies Dispense based on patient and insurance preference. Use up to four times daily as directed. (FOR ICD-10 E10.9, E11.9). 1 each 0  °• Blood Glucose Monitoring Suppl (FIFTY50 GLUCOSE METER 2.0) w/Device KIT See admin instructions.    °• carvedilol (COREG) 6.25 MG tablet Take 1 tablet (6.25 mg total) by mouth 2 (two) times daily with a meal. 60 tablet 0  °• ferrous sulfate 325 (65 FE) MG tablet Take 325 mg by mouth 2 (two) times daily with a meal.     °• glipiZIDE (GLUCOTROL XL) 5 MG 24 hr tablet Take 5 mg by mouth daily with breakfast.     °• isosorbide-hydrALAZINE (BIDIL) 20-37.5 MG tablet Take 0.5 tablets by mouth 3 (three) times daily. 45 tablet 0  °• Menthol-Methyl Salicylate (MUSCLE RUB) 10-15 % CREA Apply 1 application topically as needed for muscle pain (knees).     °• metFORMIN (GLUCOPHAGE-XR) 500 MG 24 hr tablet Take 500 mg by mouth daily with breakfast.    °• Multiple Vitamin (MULTIVITAMIN WITH MINERALS) TABS tablet Take 2 tablets by mouth daily. Alive gummy bears    °• OXYGEN Inhale 2 L into the lungs continuous.    °• pantoprazole (PROTONIX) 40 MG tablet Take 40 mg by mouth daily.    °• potassium chloride SA (KLOR-CON) 20 MEQ tablet Take 2 tablets (40 mEq total) by mouth 2 (two) times daily. 120 tablet 0  °• torsemide (DEMADEX) 20 MG tablet Take 2 tablets (40 mg total) by mouth 2 (two) times daily. 120 tablet 0  ° °No current facility-administered medications for this encounter.  ° ° °Allergies  °Allergen Reactions  °• Codeine Other (See Comments)  °  Mouth sores   °• Prednisone Other (See Comments)  °  Delusions   °• Moxifloxacin Rash  ° ° °Social History  ° °Socioeconomic History  °• Marital status: Married  °  Spouse name: Not on file  °• Number of children: Not on file  °• Years of education: Not on file  °• Highest education level: Not on file  °Occupational History  °• Not on file  °Tobacco Use  °• Smoking status: Never Smoker  °• Smokeless tobacco: Never Used  °Vaping Use  °• Vaping Use:  Never used  °Substance and Sexual Activity  °• Alcohol use: Never  °• Drug use: Never  °• Sexual activity: Not on file  °Other Topics Concern  °• Not on file  °Social History Narrative  °• Not on file  ° °Social Determinants of Health  ° °Financial Resource Strain:   °• Difficulty of Paying Living Expenses: Not on file  °Food Insecurity:   °• Worried About Running Out of Food in the Last Year: Not on file  °• Ran Out of Food in the Last Year: Not on file  °Transportation Needs: No Transportation Needs  °• Lack of Transportation (Medical): No  °• Lack of Transportation (Non-Medical): No  °Physical Activity:   °• Days of Exercise per Week: Not on file  °• Minutes of Exercise per Session: Not on file  °Stress:   °• Feeling of Stress : Not on file  °  Social Connections: Socially Integrated  °• Frequency of Communication with Friends and Family: More than three times a week  °• Frequency of Social Gatherings with Friends and Family: Once a week  °• Attends Religious Services: 1 to 4 times per year  °• Active Member of Clubs or Organizations: No  °• Attends Club or Organization Meetings: 1 to 4 times per year  °• Marital Status: Married  °Intimate Partner Violence:   °• Fear of Current or Ex-Partner: Not on file  °• Emotionally Abused: Not on file  °• Physically Abused: Not on file  °• Sexually Abused: Not on file  ° ° ° °ROS- All systems are reviewed and negative except as per the HPI above. ° °Physical Exam: °Vitals:  ° 09/20/20 1123  °BP: (!) 154/64  °Pulse: 67  °Weight: 100.2 kg  °Height: 5' 1" (1.549 m)  ° ° °GEN- The patient is well appearing obese female, alert and oriented x 3 today.   °Head- normocephalic, atraumatic °Eyes-  Sclera clear, conjunctiva pink °Ears- hearing intact °Oropharynx- clear °Neck- supple  °Lungs- Diminished breath sounds bilaterally, normal work of breathing °Heart- irregular rate and rhythm, no murmurs, rubs or gallops  °GI- soft, NT, ND, + BS °Extremities- no clubbing, cyanosis, or  edema °MS- no significant deformity or atrophy °Skin- no rash or lesion °Psych- euthymic mood, full affect °Neuro- strength and sensation are intact ° °Wt Readings from Last 3 Encounters:  °09/20/20 100.2 kg  °09/08/20 102.4 kg  °08/02/20 113.4 kg  ° ° °EKG today demonstrates afib HR 67, IVCD, QRS 126, QTc 469 ° °Echo 08/28/20 demonstrated  °1. Left ventricular ejection fraction, by estimation, is 40 to 45%. The  °left ventricle has mildly decreased function. The left ventricle  °demonstrates global hypokinesis. Left ventricular diastolic function could  °not be evaluated. There is the  °interventricular septum is flattened in systole and diastole, consistent  °with right ventricular pressure and volume overload.  ° 2. Right ventricular systolic function is normal. The right ventricular  °size is normal. There is moderately elevated pulmonary artery systolic  °pressure. The estimated right ventricular systolic pressure is 51.0 mmHg.  ° 3. The mitral valve is normal in structure. Trivial mitral valve  °regurgitation. No evidence of mitral stenosis.  ° 4. The aortic valve is tricuspid. Aortic valve regurgitation is not  °visualized. Mild to moderate aortic valve sclerosis/calcification is  °present, without any evidence of aortic stenosis.  ° 5. The inferior vena cava is normal in size with <50% respiratory  °variability, suggesting right atrial pressure of 8 mmHg.  ° °Epic records are reviewed at length today ° °CHA2DS2-VASc Score = 5  °The patient's score is based upon: °CHF History: 1 °HTN History: 1 °Diabetes History: 1 °Stroke History: 0 °Vascular Disease History: 0 °Age Score: 1 °Gender Score: 1 °   ° ° °ASSESSMENT AND PLAN: °1. Persistent Atrial Fibrillation (ICD10:  I48.19) °The patient's CHA2DS2-VASc score is 5, indicating a 7.2% annual risk of stroke.   ° °I have seen Rita [name] is a 64 y.o. female in the office today who has been referred by Dr Mclean for a Watchman left atrial appendage  closure device.  She has a history of persistent atrial fibrillation.  This patient's CHA2DS2-VASc Score and unadjusted Ischemic Stroke Rate (% per year) is equal to 7.2 % stroke rate/year from a score of 5 which necessitates long term oral anticoagulation to prevent stroke. HasBled score is 4.  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 Procedural risks for the Watchman implant have been reviewed with the patient including a 1% risk of stroke, 1% risk of perforation, 0.1% risk of device embolization.   Patient has multiple comorbidities. She and her husband are concerned about the risks of general anesthesia. Will review case with implanting physician. We would also need clearance from her Dr Melina Copa (GI) for short term anticoagulation and ASA + Plavix.   If the implanting physician and Dr Melina Copa agree she is OK for the surgery, she is interested in proceeding.   She had a cardiac MRI 08/30/20.  2. Secondary Hypercoagulable State (ICD10:  D68.69) The patient is at significant risk for stroke/thromboembolism based upon her CHA2DS2-VASc Score of 5.  However, the patient is not on anticoagulation due to her high bleeding risk.     3. Obesity Body mass index is 41.72 kg/m. Lifestyle modification was discussed at length including regular exercise and weight reduction.  4. Chronic hypoxemic respiratory failure On 2L O2 at baseline.  5. Chronic systolic CHF EF 85/63%. Down 35 lbs during her most recent hospitalization. Feeling much better Followed in [facility]   Follow up with [facility] as scheduled. Follow up pending implanter review.    [facility] [address] [place], [place] [address] [phone] 09/20/2020 11:41 AM

## 2020-09-25 NOTE — Progress Notes (Signed)
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   She has had multiple hospitalizations in the last year.  She was admitted January 2021 for COVID-19 pneumonia w/ acute hypoxic respiratory failure.  She did not require intubation.  She was treated with steroids and remdesivir.  She was readmitted twice in 7/21 for CHF exacerbations complicated by pleural effusion requiring IR guided thoracentesis.  She was readmitted again 8/21 for recurrent CHF and diuresed with IV Lasix.  Echo 8/21 showed moderately reduced systolic function, EF 35 to 40% with mild concentric left ventricular hypertrophy and grade 2 diastolic dysfunction.  There is mild hypokinesis of the left ventricular, apical segment.  RV systolic function was normal.  No prior study for comparison.   Readmitted recurrent CHF exacerbation plus AKI on CKD.Previous creatinine last admit was 1.76.  Creatinine on this admission elevated at 2.06. Chest x-ray demonstrated cardiomegaly and pulmonary venous congestion with recurrent right-sided pleural effusion.  BNP 673.  CBC shows chronic anemia, hemoglobin 8.9.  Also with worsening thrombocytopenia.  Platelets down from 122K last month down to 84K today. Echo repeated. EF 40-45%. RV ok. RVSP 51 mmHg. Placed on milrinone to support RV and diuresis. Discharge weight 225 pounds. She was discharged on bidil but her pharmacy didn't have it.   Today he returns for post  hospital follow up.Overall feeling fine. Main complaint today is joint pain. Denies SOB/PND/Orthopnea. Denies bleeding issues. Ambulating in the house. Appetite ok. No fever or chills. Weight at home 217-219 pounds. Taking all medications. Working with PT.   Echo 8/21 EF 35-40%, mod HK, G2DD, RV normal Echo 9/21 EF 40-45%, moderately elevated pulmonary artery systolic pressure,  09.6 mmHg. RV ok.    RIGHT HEART CATH   RHC Procedural Findings: Hemodynamics (mmHg) RA mean 9 RV 56/8 PA 54/17, mean 33 PCWP mean 20 Oxygen saturations: PA 63% AO 98% Cardiac Output (Fick) 6.69  Cardiac Index (Fick) 3.33 PVR 1.9 WU  Conclusion  1. Mildly elevated right and left heart filling pressures.  2. Pulmonary venous hypertension.  3. Preserved cardiac output    ROS: All systems negative except as listed in HPI, PMH and Problem List.  SH:  Social History   Socioeconomic History  . Marital status: Married    Spouse name: Not on file  . Number of children: Not on file  . Years of education: Not on file  . Highest education level: Not on file  Occupational History  . Not on file  Tobacco Use  . Smoking status: Never Smoker  . Smokeless tobacco: Never Used  Vaping Use  . Vaping Use: Never used  Substance and Sexual Activity  . Alcohol use: Never  . Drug use: Never  . Sexual activity: Not on file  Other Topics Concern  . Not on file  Social History Narrative  . Not on file   Social Determinants of Health   Financial Resource Strain:   . Difficulty of Paying Living Expenses: Not on file  Food Insecurity:   . Worried About  Running Out of Food in the Last Year: Not on file  . Ran Out of Food in the Last Year: Not on file  Transportation Needs: No Transportation Needs  . Lack of Transportation (Medical): No  . Lack of Transportation (Non-Medical): No  Physical Activity:   . Days of Exercise per Week: Not on file  . Minutes of Exercise per Session: Not on file  Stress:   .  Feeling of Stress : Not on file  Social Connections: Socially Integrated  . Frequency of Communication with Friends and Family: More than three times a week  . Frequency of Social Gatherings with Friends and Family: Once a week  . Attends Religious Services: 1 to 4 times per year  . Active Member of Clubs or Organizations: No  . Attends Archivist Meetings: 1 to 4 times per year  . Marital Status: Married  Human resources officer Violence:   . Fear of Current or Ex-Partner: Not on file  . Emotionally Abused: Not on file  . Physically Abused: Not on file  . Sexually Abused: Not on file    FH:  Family History  Problem Relation Age of Onset  . Cancer Mother     Past Medical History:  Diagnosis Date  . Acute hypoxemic respiratory failure due to COVID-19 ([place]) 12/30/2019  . Anemia   . Arthritis   . Atrial fibrillation ([place])   . Chronic kidney disease   . Chronic respiratory failure with hypoxia, on home O2 therapy ([place])   . CKD (chronic kidney disease), stage III ([place])   . COVID-19   . Diabetes mellitus without complication ([place])   . Diastolic CHF ([place])   . GI bleed   . GI bleed   . Gout   . Hypertension   . Thrombocytopenia ([place])     Current Outpatient Medications  Medication Sig Dispense Refill  . acetaminophen (TYLENOL) 325 MG tablet Take 650 mg by mouth every 6 (six) hours as needed for mild pain or headache.    . allopurinol (ZYLOPRIM) 100 MG tablet Take 100 mg by mouth 2 (two) times daily.    Marland Kitchen aspirin EC 81 MG tablet Take 81 mg by mouth daily. Swallow whole.    . blood glucose meter kit and supplies Dispense based on patient and insurance preference. Use up to four times daily as directed. (FOR ICD-10 E10.9, E11.9). 1 each 0  . Blood Glucose Monitoring Suppl (FIFTY50 GLUCOSE METER 2.0) w/Device KIT See admin instructions.    . carvedilol (COREG) 6.25 MG tablet Take 1 tablet (6.25 mg total) by mouth 2 (two) times daily with a meal. 60 tablet 0  . ferrous sulfate 325  (65 FE) MG tablet Take 325 mg by mouth 2 (two) times daily with a meal.     . glipiZIDE (GLUCOTROL XL) 5 MG 24 hr tablet Take 5 mg by mouth daily with breakfast.     . Menthol-Methyl Salicylate (MUSCLE RUB) 10-15 % CREA Apply 1 application topically as needed for muscle pain (knees).     . metFORMIN (GLUCOPHAGE-XR) 500 MG 24 hr tablet Take 500 mg by mouth daily with breakfast.    . Multiple Vitamin (MULTIVITAMIN WITH MINERALS) TABS tablet Take 2 tablets by mouth daily. Alive gummy bears    . OXYGEN Inhale 2 L into the lungs continuous.    . pantoprazole (PROTONIX) 40 MG tablet Take 40 mg by mouth daily.    . potassium chloride SA (KLOR-CON) 20 MEQ tablet Take 2 tablets (40  mEq total) by mouth 2 (two) times daily. 120 tablet 0  . torsemide (DEMADEX) 20 MG tablet Take 2 tablets (40 mg total) by mouth 2 (two) times daily. 120 tablet 0   No current facility-administered medications for this encounter.    Vitals:   09/26/20 0837  BP: 138/80  Pulse: 68  SpO2: 95%  Weight: 99.2 kg (218 lb 12.8 oz)     Wt Readings from Last 3 Encounters:  09/26/20 99.2 kg (218 lb 12.8 oz)  09/20/20 100.2 kg (220 lb 12.8 oz)  09/08/20 102.4 kg (225 lb 12.8 oz)    ReDS Vest / Clip - 09/26/20 0837      ReDS Vest / Clip   Station Marker B    Ruler Value 38    ReDS Value Range Low volume    ReDS Actual Value 28    Anatomical Comments sitting           PHYSICAL EXAM: General:  Arrived in a wheel chair.  No resp difficulty HEENT: normal Neck: supple. JVP flat. Carotids 2+ bilaterally; no bruits. No lymphadenopathy or thryomegaly appreciated. Cor: PMI normal. Irregular rate & rhythm. No rubs, gallops or murmurs. Lungs: clear on 2 liters.  Abdomen: soft, nontender, nondistended. No hepatosplenomegaly. No bruits or masses. Good bowel sounds. Extremities: no cyanosis, clubbing, rash, edema Neuro: alert & orientedx3, cranial nerves grossly intact. Moves all 4 extremities w/o difficulty. Affect  pleasant.  EKG: A fib 69 bpm. Personally reviewed.   ASSESSMENT & PLAN: 1.Chronic systolic CHF: Prominent signs of RV failure.  I reviewed 9/21 echo, EF 40-45%, global hypokinesis, D-shaped IV septum suggestive of RV pressure/volume overload, RV poorly visualized but appeared normal in size with at least mild systolic dysfunction. Cause of cardiomyopathy is uncertain. She has history of COVID-19 1/21, cannot rule out viral myocarditis. Chemotherapy-mediated CMP is possible (not sure what chemo she had remotely for breast cancer). Cardiac MRI showed LV EF 41% with diffuse hypokinesis, RV mildly dilated but with EF 27% and D-shaped septum, nonspecific RV insertion site LGE.  Suspect RV > LV failure. Cardiac MRI is not suggestive of amyloidosis, no M-spike on myeloma panel. We have not ruled out CAD. She has had recurrent HF admissions this year.  Milrinone started primarily for RV support. Diuresed well on lasix gtt. RHC  w/ mildly elevated right and left heart filling pressures and preserved CO.    - Reds Clip 29%.   - NYHA II. Functional ability limited by joint pain. She doesn't ambulate much.  - Volume status Stable. Reds clip 29%. Will check BMET if creatinine trending will cut back torsemide.  For now continue torsemide 40 mg bid.   - Continue  Coreg to 6.25 mg bid.  - No ARNI/spironolactone for now with elevated creatinine.  -I contacted pharmacy about cost of Bidil. $90.00 copay for Bidil. For this reason I will start hydralazine 25 mg three times a day and imdur 30 mg daily. We discussed potential headaches.  - Discussed cardiomems and she viewed Cardiomems informational video. Will start consent process. Will need to determine anticoagulation regimen post implant once approved.  2. Chronic hypoxemic respiratory failure: Suspect OHS/OSA. She was using home oxygen prior to COVID-19 PNA.  - Continue supplemental oxygen.  - Set up for home sleep study for day time fatigue and snoring. 3.  Atrial fibrillation: Chronic, all ECGs since 7/21 show atrial fibrillation. 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 Rate control is reasonable.  - She cannot be cardioverted at this point as she cannot be chronically anticoagulated. - Rate controlled. Continue current dose of carvedilol.   - Refer to EP for possible Watchman though would need 45 days warfarin and Plavix x 6 months after that.  4-. GI bleeding: Recurrent, from small bowel AVMs. .  - Recent Tsat 22% (ok) - Check CBC  5. Right pleural effusion: Suspect due to CHF.  Had right thoracentesis with 500 cc off on 9/23, pleural fluid protein suggested exudative though PF LDH did not.   Lungs sounds ok today.  6. H/o DVT/PE: Provoked (post-surgery). Has IVC filter.  7. CKD: Stage IV. Check BMET . Will need referral to Nephrology.   Follow up in 2 weeks and 6-8 weeks with Dr Aundra Dubin. Next visit consider SGT2i.but may not be possible with GFR< 30.   Greater than 50% of the (total minutes 40 ) visit spent in counseling/coordination of care regarding the above.     Rita Oldenburg NP-C  9:41 AM

## 2020-09-26 ENCOUNTER — Ambulatory Visit (HOSPITAL_COMMUNITY)
Admit: 2020-09-26 | Discharge: 2020-09-26 | Disposition: A | Discharge status: Home / Self Care | Payer: PPO | Source: Ambulatory Visit | Attending: Adult Health | Admitting: Adult Health

## 2020-09-26 ENCOUNTER — Telehealth (HOSPITAL_COMMUNITY): Payer: Self-pay | Admitting: Pharmacy Technician

## 2020-09-26 ENCOUNTER — Other Ambulatory Visit: Payer: Self-pay

## 2020-09-26 VITALS — BP 138/80 | HR 68 | Wt 218.8 lb

## 2020-09-26 DIAGNOSIS — J9611 Chronic respiratory failure with hypoxia: Secondary | ICD-10-CM | POA: Diagnosis not present

## 2020-09-26 DIAGNOSIS — E1122 Type 2 diabetes mellitus with diabetic chronic kidney disease: Secondary | ICD-10-CM | POA: Insufficient documentation

## 2020-09-26 DIAGNOSIS — Z9981 Dependence on supplemental oxygen: Secondary | ICD-10-CM | POA: Diagnosis not present

## 2020-09-26 DIAGNOSIS — R400 Somnolence: Secondary | ICD-10-CM

## 2020-09-26 DIAGNOSIS — I5042 Chronic combined systolic (congestive) and diastolic (congestive) heart failure: Secondary | ICD-10-CM | POA: Diagnosis not present

## 2020-09-26 DIAGNOSIS — Z8616 Personal history of COVID-19: Secondary | ICD-10-CM | POA: Insufficient documentation

## 2020-09-26 DIAGNOSIS — K219 Gastro-esophageal reflux disease without esophagitis: Secondary | ICD-10-CM | POA: Insufficient documentation

## 2020-09-26 DIAGNOSIS — Z7901 Long term (current) use of anticoagulants: Secondary | ICD-10-CM | POA: Insufficient documentation

## 2020-09-26 DIAGNOSIS — N184 Chronic kidney disease, stage 4 (severe): Secondary | ICD-10-CM | POA: Diagnosis not present

## 2020-09-26 DIAGNOSIS — Z79899 Other long term (current) drug therapy: Secondary | ICD-10-CM | POA: Diagnosis not present

## 2020-09-26 DIAGNOSIS — D631 Anemia in chronic kidney disease: Secondary | ICD-10-CM | POA: Diagnosis not present

## 2020-09-26 DIAGNOSIS — M1990 Unspecified osteoarthritis, unspecified site: Secondary | ICD-10-CM | POA: Insufficient documentation

## 2020-09-26 DIAGNOSIS — E669 Obesity, unspecified: Secondary | ICD-10-CM | POA: Diagnosis not present

## 2020-09-26 DIAGNOSIS — I130 Hypertensive heart and chronic kidney disease with heart failure and stage 1 through stage 4 chronic kidney disease, or unspecified chronic kidney disease: Secondary | ICD-10-CM | POA: Diagnosis not present

## 2020-09-26 DIAGNOSIS — J90 Pleural effusion, not elsewhere classified: Secondary | ICD-10-CM | POA: Insufficient documentation

## 2020-09-26 DIAGNOSIS — Z86718 Personal history of other venous thrombosis and embolism: Secondary | ICD-10-CM | POA: Diagnosis not present

## 2020-09-26 DIAGNOSIS — I4819 Other persistent atrial fibrillation: Secondary | ICD-10-CM | POA: Diagnosis not present

## 2020-09-26 DIAGNOSIS — Z7982 Long term (current) use of aspirin: Secondary | ICD-10-CM | POA: Insufficient documentation

## 2020-09-26 DIAGNOSIS — Z86711 Personal history of pulmonary embolism: Secondary | ICD-10-CM | POA: Insufficient documentation

## 2020-09-26 DIAGNOSIS — Z7984 Long term (current) use of oral hypoglycemic drugs: Secondary | ICD-10-CM | POA: Diagnosis not present

## 2020-09-26 DIAGNOSIS — I482 Chronic atrial fibrillation, unspecified: Secondary | ICD-10-CM | POA: Diagnosis not present

## 2020-09-26 DIAGNOSIS — M109 Gout, unspecified: Secondary | ICD-10-CM | POA: Diagnosis not present

## 2020-09-26 LAB — CBC
HCT: 35.3 % — ABNORMAL LOW (ref 36.0–46.0)
Hemoglobin: 11 g/dL — ABNORMAL LOW (ref 12.0–15.0)
MCH: 26.5 pg (ref 26.0–34.0)
MCHC: 31.2 g/dL (ref 30.0–36.0)
MCV: 85.1 fL (ref 80.0–100.0)
Platelets: 174 10*3/uL (ref 150–400)
RBC: 4.15 MIL/uL (ref 3.87–5.11)
RDW: 17.2 % — ABNORMAL HIGH (ref 11.5–15.5)
WBC: 4.7 10*3/uL (ref 4.0–10.5)
nRBC: 0 % (ref 0.0–0.2)

## 2020-09-26 LAB — BASIC METABOLIC PANEL
Anion gap: 13 (ref 5–15)
BUN: 35 mg/dL — ABNORMAL HIGH (ref 8–23)
CO2: 28 mmol/L (ref 22–32)
Calcium: 10 mg/dL (ref 8.9–10.3)
Chloride: 96 mmol/L — ABNORMAL LOW (ref 98–111)
Creatinine, Ser: 2.12 mg/dL — ABNORMAL HIGH (ref 0.44–1.00)
GFR, Estimated: 24 mL/min — ABNORMAL LOW (ref >60)
Glucose, Bld: 99 mg/dL (ref 70–99)
Potassium: 4.7 mmol/L (ref 3.5–5.1)
Sodium: 137 mmol/L (ref 135–145)

## 2020-09-26 MED ORDER — HYDRALAZINE HCL 25 MG PO TABS: 25.0000 mg | ORAL_TABLET | Freq: Three times a day (TID) | ORAL | 3 refills | Status: DC

## 2020-09-26 MED ORDER — ISOSORBIDE MONONITRATE ER 30 MG PO TB24: 30.0000 mg | ORAL_TABLET | Freq: Every day | ORAL | 3 refills | Status: DC

## 2020-09-26 NOTE — Progress Notes (Signed)
ReDS Vest / Clip - 09/26/20 0837      ReDS Vest / Clip   Station Marker B    Ruler Value 38    ReDS Value Range Low volume    ReDS Actual Value 28    Anatomical Comments sitting

## 2020-09-26 NOTE — Patient Instructions (Signed)
Start Hydralazine 25 mg Three times a day   Start Imdur 30 mg Daily  Labs done today, we will call you for abnormal results  Your provider has recommended that you have a home sleep study.  This has to be approved by your insurance company. We will schedule you an appointment to pick up the equipment to give [place] time to complete this authorization. Once you have the equipment you will download the app on your phone and follow the instructions. YOUR PIN NUMBER IS: [id number]. Once you have completed the test the information is sent to the company through [employer] and you can dispose of the equipment. If your test is positive you will receive a call from Dr Theodosia Blender office [facility]) to set up your CPAP equipment.  You have been referred to Dr Rayann Heman, his office will call you to schedule an appointment  Your physician recommends that you schedule a follow-up appointment in: 2 weeks with APP  Your physician recommends that you schedule a follow-up appointment in: 6-8 weeks with Dr Aundra Dubin  If you have any questions or concerns before your next appointment please send [place] a message through [facility] or call our office at [phone].    TO LEAVE A MESSAGE FOR THE NURSE SELECT OPTION 2, PLEASE LEAVE A MESSAGE INCLUDING: . YOUR NAME . DATE OF BIRTH . CALL BACK NUMBER . REASON FOR CALL**this is important as we prioritize the call backs  [place] AS LONG AS YOU CALL BEFORE 4:00 PM  At the [facility], you and your health needs are our priority. As part of our continuing mission to provide you with exceptional heart care, we have created designated Provider Care Teams. These Care Teams include your primary Cardiologist (physician) and Advanced Practice Providers (APPs- Physician Assistants and Nurse Practitioners) who all work together to provide you with the care you need, when you need it.   You may see any of the following providers on  your designated Care Team at your next follow up: Marland Kitchen Dr Glori Bickers . Dr Loralie Champagne . Darrick Grinder, NP . Lyda Jester, PA . Audry Riles, PharmD   Please be sure to bring in all your medications bottles to every appointment.

## 2020-09-26 NOTE — Progress Notes (Signed)
Height: 5'1"    Weight: 218 lb BMI: 41.34  Today's Date: 09/26/20  STOP BANG RISK ASSESSMENT S (snore) Have you been told that you snore?     YES   T (tired) Are you often tired, fatigued, or sleepy during the day?   YES  O (obstruction) Do you stop breathing, choke, or gasp during sleep? NO   P (pressure) Do you have or are you being treated for high blood pressure? YES   B (BMI) Is your body index greater than 35 kg/m? YES   A (age) Are you 66 years old or older? YES   N (neck) Do you have a neck circumference greater than 16 inches?     G (gender) Are you a female? NO   TOTAL STOP/BANG "YES" ANSWERS 5                                                                       For Office Use Only              Procedure Order Form    YES to 3+ Stop Bang questions OR two clinical symptoms - patient qualifies for WatchPAT (CPT 95800)      Clinical Notes: Will consult Sleep Specialist and refer for management of therapy due to patient increased risk of Sleep Apnea. Ordering a sleep study due to the following two clinical symptoms: Excessive daytime sleepiness G47.10 / Loud snoring R06.83

## 2020-09-26 NOTE — Addendum Note (Signed)
Encounter addended by: Scarlette Calico, RN on: 09/26/2020 5:52 PM  Actions taken: Clinical Note Signed

## 2020-09-26 NOTE — Telephone Encounter (Signed)
The patient's current 30 day co-pay for Bidil one-half tablet three times daily is $90.00. Will help the patient apply for assistance if the co-pay is unaffordable.

## 2020-09-27 DIAGNOSIS — M1712 Unilateral primary osteoarthritis, left knee: Secondary | ICD-10-CM | POA: Diagnosis not present

## 2020-09-28 DIAGNOSIS — I248 Other forms of acute ischemic heart disease: Secondary | ICD-10-CM | POA: Diagnosis not present

## 2020-09-28 DIAGNOSIS — Z853 Personal history of malignant neoplasm of breast: Secondary | ICD-10-CM | POA: Diagnosis not present

## 2020-09-28 DIAGNOSIS — I130 Hypertensive heart and chronic kidney disease with heart failure and stage 1 through stage 4 chronic kidney disease, or unspecified chronic kidney disease: Secondary | ICD-10-CM | POA: Diagnosis not present

## 2020-09-28 DIAGNOSIS — D696 Thrombocytopenia, unspecified: Secondary | ICD-10-CM | POA: Diagnosis not present

## 2020-09-28 DIAGNOSIS — N1832 Chronic kidney disease, stage 3b: Secondary | ICD-10-CM | POA: Diagnosis not present

## 2020-09-28 DIAGNOSIS — Z7982 Long term (current) use of aspirin: Secondary | ICD-10-CM | POA: Diagnosis not present

## 2020-09-28 DIAGNOSIS — N184 Chronic kidney disease, stage 4 (severe): Secondary | ICD-10-CM | POA: Diagnosis not present

## 2020-09-28 DIAGNOSIS — Z6841 Body Mass Index (BMI) 40.0 and over, adult: Secondary | ICD-10-CM | POA: Diagnosis not present

## 2020-09-28 DIAGNOSIS — D631 Anemia in chronic kidney disease: Secondary | ICD-10-CM | POA: Diagnosis not present

## 2020-09-28 DIAGNOSIS — Z8616 Personal history of COVID-19: Secondary | ICD-10-CM | POA: Diagnosis not present

## 2020-09-28 DIAGNOSIS — Z9981 Dependence on supplemental oxygen: Secondary | ICD-10-CM | POA: Diagnosis not present

## 2020-09-28 DIAGNOSIS — I272 Pulmonary hypertension, unspecified: Secondary | ICD-10-CM | POA: Diagnosis not present

## 2020-09-28 DIAGNOSIS — I472 Ventricular tachycardia: Secondary | ICD-10-CM | POA: Diagnosis not present

## 2020-09-28 DIAGNOSIS — E1122 Type 2 diabetes mellitus with diabetic chronic kidney disease: Secondary | ICD-10-CM | POA: Diagnosis not present

## 2020-09-28 DIAGNOSIS — I5042 Chronic combined systolic (congestive) and diastolic (congestive) heart failure: Secondary | ICD-10-CM | POA: Diagnosis not present

## 2020-09-28 DIAGNOSIS — Z7984 Long term (current) use of oral hypoglycemic drugs: Secondary | ICD-10-CM | POA: Diagnosis not present

## 2020-09-28 DIAGNOSIS — I5043 Acute on chronic combined systolic (congestive) and diastolic (congestive) heart failure: Secondary | ICD-10-CM | POA: Diagnosis not present

## 2020-09-28 DIAGNOSIS — J9611 Chronic respiratory failure with hypoxia: Secondary | ICD-10-CM | POA: Diagnosis not present

## 2020-09-28 DIAGNOSIS — I4819 Other persistent atrial fibrillation: Secondary | ICD-10-CM | POA: Diagnosis not present

## 2020-09-28 DIAGNOSIS — D61818 Other pancytopenia: Secondary | ICD-10-CM | POA: Diagnosis not present

## 2020-09-28 DIAGNOSIS — J9621 Acute and chronic respiratory failure with hypoxia: Secondary | ICD-10-CM | POA: Diagnosis not present

## 2020-09-28 DIAGNOSIS — M170 Bilateral primary osteoarthritis of knee: Secondary | ICD-10-CM | POA: Diagnosis not present

## 2020-09-28 DIAGNOSIS — E782 Mixed hyperlipidemia: Secondary | ICD-10-CM | POA: Diagnosis not present

## 2020-09-28 DIAGNOSIS — M109 Gout, unspecified: Secondary | ICD-10-CM | POA: Diagnosis not present

## 2020-10-03 ENCOUNTER — Ambulatory Visit: Payer: PPO | Admitting: Cardiology

## 2020-10-03 ENCOUNTER — Other Ambulatory Visit: Payer: Self-pay

## 2020-10-03 ENCOUNTER — Encounter: Payer: Self-pay | Admitting: Cardiology

## 2020-10-03 VITALS — BP 128/70 | HR 65 | Ht 61.0 in | Wt 217.0 lb

## 2020-10-03 DIAGNOSIS — N184 Chronic kidney disease, stage 4 (severe): Secondary | ICD-10-CM | POA: Diagnosis not present

## 2020-10-03 DIAGNOSIS — I4821 Permanent atrial fibrillation: Secondary | ICD-10-CM | POA: Diagnosis not present

## 2020-10-03 DIAGNOSIS — I130 Hypertensive heart and chronic kidney disease with heart failure and stage 1 through stage 4 chronic kidney disease, or unspecified chronic kidney disease: Secondary | ICD-10-CM | POA: Diagnosis not present

## 2020-10-03 DIAGNOSIS — I5042 Chronic combined systolic (congestive) and diastolic (congestive) heart failure: Secondary | ICD-10-CM | POA: Diagnosis not present

## 2020-10-03 DIAGNOSIS — K922 Gastrointestinal hemorrhage, unspecified: Secondary | ICD-10-CM | POA: Diagnosis not present

## 2020-10-03 NOTE — Patient Instructions (Addendum)
Medication Instructions:  Your physician recommends that you continue on your current medications as directed. Please refer to the Current Medication list given to you today.  *If you need a refill on your cardiac medications before your next appointment, please call your pharmacy*  Lab Work: None ordered.  If you have labs (blood work) drawn today and your tests are completely normal, you will receive your results only by: Marland Kitchen MyChart Message (if you have MyChart) OR . A paper copy in the mail If you have any lab test that is abnormal or we need to change your treatment, we will call you to review the results.  Testing/Procedures: None ordered.  Follow-Up: At [facility], you and your health needs are our priority.  As part of our continuing mission to provide you with exceptional heart care, we have created designated Provider Care Teams.  These Care Teams include your primary Cardiologist (physician) and Advanced Practice Providers (APPs -  Physician Assistants and Nurse Practitioners) who all work together to provide you with the care you need, when you need it.  We recommend signing up for the patient portal called "MyChart".  Sign up information is provided on this After Visit Summary.  MyChart is used to connect with patients for Virtual Visits (Telemedicine).  Patients are able to view lab/test results, encounter notes, upcoming appointments, etc.  Non-urgent messages can be sent to your provider as well.   To learn more about what you can do with MyChart, go to [email or web address].    Your next appointment:   Your physician wants you to follow-up in: as needed. appointment.    Other Instructions:

## 2020-10-03 NOTE — Progress Notes (Signed)
Electrophysiology Office Note:    Date:  10/03/2020   ID:  Rita [name], DOB June 11, 1956, MRN [id number]  PCP:  [name], MD  [facility] Cardiologist:  No primary care provider on file.  [facility] Electrophysiologist:  None   Referring MD: Conrad Carey, NP   Chief Complaint: Atrial fibrillation and watchman consult  History of Present Illness:    Rita [name] is a 64 y.o. female who presents for an evaluation of atrial fibrillation at the request of Darrick Grinder, NP. 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  She is currently not taking anticoagulation.  Recently, she has been admitted in July of this year for heart failure exacerbations complicated by pleural effusions.  These required thoracenteses.  She was admitted again in August and September of this year for recurrent heart failure requiring diuresis with IV Lasix.  Past Medical History:  Diagnosis Date  . Acute hypoxemic respiratory failure due to COVID-19 ([place]) 12/30/2019  . Anemia   . Arthritis   . Atrial fibrillation ([place])   . Chronic kidney disease   . Chronic respiratory failure with hypoxia, on home O2 therapy ([place])   . CKD (chronic kidney disease), stage III ([place])   . COVID-19   . Diabetes mellitus without complication ([place])   . Diastolic CHF ([place])   . GI bleed   . GI bleed   . Gout   . Hypertension   . Thrombocytopenia ([place])     Past Surgical History:  Procedure Laterality Date  . HERNIA REPAIR  02/2019  . IR THORACENTESIS ASP PLEURAL SPACE W/IMG GUIDE  06/28/2020  . IR THORACENTESIS ASP PLEURAL SPACE W/IMG GUIDE  08/31/2020  . RIGHT HEART CATH N/A 09/07/2020   Procedure: RIGHT HEART CATH;  Surgeon:  Larey Dresser, MD;  Location: [facility];  Service: Cardiovascular;  Laterality: N/A;    Current Medications: Current Meds  Medication Sig  . acetaminophen (TYLENOL) 325 MG tablet Take 650 mg by mouth every 6 (six) hours as needed for mild pain or headache.  . allopurinol (ZYLOPRIM) 100 MG tablet Take 100 mg by mouth 2 (two) times daily.  Marland Kitchen aspirin EC 81 MG tablet Take 81 mg by mouth daily. Swallow whole.  . blood glucose meter kit and supplies Dispense based on patient and insurance preference. Use up to four times daily as directed. (FOR ICD-10 E10.9, E11.9).  Marland Kitchen Blood Glucose Monitoring Suppl (FIFTY50 GLUCOSE METER 2.0) w/Device KIT See admin instructions.  . carvedilol (COREG) 6.25 MG tablet Take 1 tablet (6.25 mg total) by mouth 2 (two) times daily with a meal.  . ferrous sulfate 325 (65 FE) MG tablet Take 325 mg by mouth 2 (two) times daily with a meal.   . glipiZIDE (GLUCOTROL XL) 5 MG 24 hr tablet Take 5 mg by mouth daily with breakfast.   . hydrALAZINE (APRESOLINE) 25 MG tablet Take 1 tablet (25 mg total) by mouth 3 (three) times daily.  . isosorbide mononitrate (IMDUR) 30 MG 24 hr tablet Take 1 tablet (30 mg total) by mouth daily.  . Menthol-Methyl Salicylate (MUSCLE RUB) 10-15 % CREA Apply 1 application topically as needed for muscle pain (knees).   . metFORMIN (GLUCOPHAGE-XR) 500 MG 24 hr tablet Take 500  mg by mouth daily with breakfast.  . Multiple Vitamin (MULTIVITAMIN WITH MINERALS) TABS tablet Take 2 tablets by mouth daily. Alive gummy bears  . OXYGEN Inhale 2 L into the lungs continuous.  . pantoprazole (PROTONIX) 40 MG tablet Take 40 mg by mouth daily.  . potassium chloride SA (KLOR-CON) 20 MEQ tablet Take 2 tablets (40 mEq total) by mouth 2 (two) times daily.  Marland Kitchen torsemide (DEMADEX) 20 MG tablet Take 2 tablets (40 mg total) by mouth 2 (two) times daily.     Allergies:   Codeine, Prednisone, and Moxifloxacin   Social History   Socioeconomic History  . Marital  status: Married    Spouse name: Not on file  . Number of children: Not on file  . Years of education: Not on file  . Highest education level: Not on file  Occupational History  . Not on file  Tobacco Use  . Smoking status: Never Smoker  . Smokeless tobacco: Never Used  Vaping Use  . Vaping Use: Never used  Substance and Sexual Activity  . Alcohol use: Never  . Drug use: Never  . Sexual activity: Not on file  Other Topics Concern  . Not on file  Social History Narrative  . Not on file   Social Determinants of Health   Financial Resource Strain:   . Difficulty of Paying Living Expenses: Not on file  Food Insecurity:   . Worried About Charity fundraiser in the Last Year: Not on file  . Ran Out of Food in the Last Year: Not on file  Transportation Needs: No Transportation Needs  . Lack of Transportation (Medical): No  . Lack of Transportation (Non-Medical): No  Physical Activity:   . Days of Exercise per Week: Not on file  . Minutes of Exercise per Session: Not on file  Stress:   . Feeling of Stress : Not on file  Social Connections: Socially Integrated  . Frequency of Communication with Friends and Family: More than three times a week  . Frequency of Social Gatherings with Friends and Family: Once a week  . Attends Religious Services: 1 to 4 times per year  . Active Member of Clubs or Organizations: No  . Attends Archivist Meetings: 1 to 4 times per year  . Marital Status: Married     Family History: The patient's family history includes Cancer in her mother.  ROS:   Please see the history of present illness.    All other systems reviewed and are negative.  EKGs/Labs/Other Studies Reviewed:    The following studies were reviewed today: Prior records, echo  August 28, 2020 echo personally reviewed Left ventricular function moderately decreased, 40% Interventricular septal flattening in systole and diastole PASP estimated at 51 mmHg  09/07/2020  RHC RA mean 9 RV 56/8 PA 54/17, mean 33 PCWP mean 20  Oxygen saturations: PA 63% AO 98%  Cardiac Output (Fick) 6.69  Cardiac Index (Fick) 3.33 PVR 1.9 WU       Recent Labs: 06/27/2020: TSH 0.968 08/28/2020: B Natriuretic Peptide 673.5 09/06/2020: ALT 13 09/07/2020: Magnesium 2.5 09/26/2020: BUN 35; Creatinine, Ser 2.12; Hemoglobin 11.0; Platelets 174; Potassium 4.7; Sodium 137  Recent Lipid Panel    Component Value Date/Time   CHOL 124 07/20/2020 0432   TRIG 46 07/20/2020 0432   HDL 50 07/20/2020 0432   CHOLHDL 2.5 07/20/2020 0432   VLDL 9 07/20/2020 0432   LDLCALC 65 07/20/2020 0432    Physical Exam:  VS:  BP 128/70   Pulse 65   Ht _0  (1.549 m)   Wt 217 lb (98.4 kg)   SpO2 97%   BMI 41.00 kg/m     Wt Readings from Last 3 Encounters:  10/03/20 217 lb (98.4 kg)  09/26/20 218 lb 12.8 oz (99.2 kg)  09/20/20 220 lb 12.8 oz (100.2 kg)     GEN: Chronically ill-appearing.  Obese HEENT: Normal NECK: No JVD; No carotid bruits LYMPHATICS: No lymphadenopathy CARDIAC: Irregularly irregular, no murmurs, rubs, gallops RESPIRATORY:  Clear to auscultation without rales, wheezing or rhonchi  ABDOMEN: Soft, non-tender, non-distended MUSCULOSKELETAL:  No edema; No deformity  SKIN: Warm and dry NEUROLOGIC:  Alert and oriented x 3 PSYCHIATRIC:  Normal affect   ASSESSMENT:    1. Permanent atrial fibrillation ([place])   2. Chronic combined systolic and diastolic heart failure ([place])   3. 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 Gastrointestinal hemorrhage, unspecified gastrointestinal hemorrhage type    PLAN:    In order of problems listed above:  1. Permanent. Atrial fibrillation Patient with a CHA2DS2-VASc of 5 not currently on anticoagulation due to recurrent GI bleeding from AVMs.  The patient has multiple severe medical comorbidities and is on home oxygen therapy.  There is evidence of pulmonary hypertension and she is  required frequent hospitalizations for fluid overload requiring diuresis and thoracenteses.  Given the patient's multiple severe medical comorbidities, I do not think she is a candidate for left atrial appendage occlusion given the risks associated with general anesthesia.  I discussed this in great detail with the patient and her family who is with her today.  I have recommended that we focus our efforts on maintaining euvolemia and to keep her out of the hospital for recurrent heart failure exacerbations.  They are in agreement with this plan.  2.  Chronic combined systolic and diastolic heart failure Requiring multiple recent hospitalizations and thoracenteses for pleural effusions.  3.  Recurrent GI bleeding due to AVMs Not currently taking anticoagulation for her atrial fibrillation given the GI bleeding history.  I do not think she is a good candidate for the watchman procedure given the elevated procedural risk in general anesthesia.  Medication Adjustments/Labs and Tests Ordered: Current medicines are reviewed at length with the patient today.  Concerns regarding medicines are outlined above.  No orders of the defined types were placed in this encounter.  No orders of the defined types were placed in this encounter.    Signed, Lars Mage, MD, [facility]  10/03/2020 12:05 PM    Electrophysiology [place]

## 2020-10-12 DIAGNOSIS — Z8616 Personal history of COVID-19: Secondary | ICD-10-CM | POA: Diagnosis not present

## 2020-10-12 DIAGNOSIS — I10 Essential (primary) hypertension: Secondary | ICD-10-CM | POA: Diagnosis not present

## 2020-10-12 DIAGNOSIS — I4819 Other persistent atrial fibrillation: Secondary | ICD-10-CM | POA: Diagnosis not present

## 2020-10-12 DIAGNOSIS — I872 Venous insufficiency (chronic) (peripheral): Secondary | ICD-10-CM | POA: Diagnosis not present

## 2020-10-12 DIAGNOSIS — I509 Heart failure, unspecified: Secondary | ICD-10-CM | POA: Diagnosis not present

## 2020-10-12 DIAGNOSIS — I272 Pulmonary hypertension, unspecified: Secondary | ICD-10-CM | POA: Diagnosis not present

## 2020-10-13 ENCOUNTER — Other Ambulatory Visit: Payer: Self-pay | Admitting: Student

## 2020-10-13 DIAGNOSIS — I4819 Other persistent atrial fibrillation: Secondary | ICD-10-CM | POA: Diagnosis not present

## 2020-10-13 DIAGNOSIS — M170 Bilateral primary osteoarthritis of knee: Secondary | ICD-10-CM | POA: Diagnosis not present

## 2020-10-13 DIAGNOSIS — D61818 Other pancytopenia: Secondary | ICD-10-CM | POA: Diagnosis not present

## 2020-10-13 DIAGNOSIS — M109 Gout, unspecified: Secondary | ICD-10-CM | POA: Diagnosis not present

## 2020-10-13 DIAGNOSIS — Z8616 Personal history of COVID-19: Secondary | ICD-10-CM | POA: Diagnosis not present

## 2020-10-13 DIAGNOSIS — D631 Anemia in chronic kidney disease: Secondary | ICD-10-CM | POA: Diagnosis not present

## 2020-10-13 DIAGNOSIS — I130 Hypertensive heart and chronic kidney disease with heart failure and stage 1 through stage 4 chronic kidney disease, or unspecified chronic kidney disease: Secondary | ICD-10-CM | POA: Diagnosis not present

## 2020-10-13 DIAGNOSIS — Z7984 Long term (current) use of oral hypoglycemic drugs: Secondary | ICD-10-CM | POA: Diagnosis not present

## 2020-10-13 DIAGNOSIS — N184 Chronic kidney disease, stage 4 (severe): Secondary | ICD-10-CM | POA: Diagnosis not present

## 2020-10-13 DIAGNOSIS — Z853 Personal history of malignant neoplasm of breast: Secondary | ICD-10-CM | POA: Diagnosis not present

## 2020-10-13 DIAGNOSIS — J9621 Acute and chronic respiratory failure with hypoxia: Secondary | ICD-10-CM | POA: Diagnosis not present

## 2020-10-13 DIAGNOSIS — I472 Ventricular tachycardia: Secondary | ICD-10-CM | POA: Diagnosis not present

## 2020-10-13 DIAGNOSIS — E1122 Type 2 diabetes mellitus with diabetic chronic kidney disease: Secondary | ICD-10-CM | POA: Diagnosis not present

## 2020-10-13 DIAGNOSIS — D696 Thrombocytopenia, unspecified: Secondary | ICD-10-CM | POA: Diagnosis not present

## 2020-10-13 DIAGNOSIS — Z7982 Long term (current) use of aspirin: Secondary | ICD-10-CM | POA: Diagnosis not present

## 2020-10-13 DIAGNOSIS — Z9981 Dependence on supplemental oxygen: Secondary | ICD-10-CM | POA: Diagnosis not present

## 2020-10-13 DIAGNOSIS — I5043 Acute on chronic combined systolic (congestive) and diastolic (congestive) heart failure: Secondary | ICD-10-CM | POA: Diagnosis not present

## 2020-10-19 ENCOUNTER — Other Ambulatory Visit: Payer: Self-pay

## 2020-10-19 ENCOUNTER — Encounter (HOSPITAL_COMMUNITY): Payer: Self-pay

## 2020-10-19 ENCOUNTER — Ambulatory Visit (HOSPITAL_COMMUNITY)
Admission: RE | Admit: 2020-10-19 | Discharge: 2020-10-19 | Disposition: A | Discharge status: Home / Self Care | Payer: PPO | Source: Ambulatory Visit | Attending: Internal Medicine | Admitting: Internal Medicine

## 2020-10-19 VITALS — BP 152/70 | HR 53 | Wt 214.6 lb

## 2020-10-19 DIAGNOSIS — I482 Chronic atrial fibrillation, unspecified: Secondary | ICD-10-CM | POA: Diagnosis not present

## 2020-10-19 DIAGNOSIS — Z79899 Other long term (current) drug therapy: Secondary | ICD-10-CM | POA: Insufficient documentation

## 2020-10-19 DIAGNOSIS — Z853 Personal history of malignant neoplasm of breast: Secondary | ICD-10-CM | POA: Diagnosis not present

## 2020-10-19 DIAGNOSIS — Z86711 Personal history of pulmonary embolism: Secondary | ICD-10-CM | POA: Diagnosis not present

## 2020-10-19 DIAGNOSIS — Z8719 Personal history of other diseases of the digestive system: Secondary | ICD-10-CM | POA: Insufficient documentation

## 2020-10-19 DIAGNOSIS — E1122 Type 2 diabetes mellitus with diabetic chronic kidney disease: Secondary | ICD-10-CM | POA: Insufficient documentation

## 2020-10-19 DIAGNOSIS — I130 Hypertensive heart and chronic kidney disease with heart failure and stage 1 through stage 4 chronic kidney disease, or unspecified chronic kidney disease: Secondary | ICD-10-CM | POA: Diagnosis not present

## 2020-10-19 DIAGNOSIS — Z7982 Long term (current) use of aspirin: Secondary | ICD-10-CM | POA: Diagnosis not present

## 2020-10-19 DIAGNOSIS — I5042 Chronic combined systolic (congestive) and diastolic (congestive) heart failure: Secondary | ICD-10-CM | POA: Insufficient documentation

## 2020-10-19 DIAGNOSIS — N179 Acute kidney failure, unspecified: Secondary | ICD-10-CM | POA: Diagnosis not present

## 2020-10-19 DIAGNOSIS — Z86718 Personal history of other venous thrombosis and embolism: Secondary | ICD-10-CM | POA: Insufficient documentation

## 2020-10-19 DIAGNOSIS — Z7984 Long term (current) use of oral hypoglycemic drugs: Secondary | ICD-10-CM | POA: Diagnosis not present

## 2020-10-19 DIAGNOSIS — I5022 Chronic systolic (congestive) heart failure: Secondary | ICD-10-CM | POA: Diagnosis not present

## 2020-10-19 DIAGNOSIS — Z9981 Dependence on supplemental oxygen: Secondary | ICD-10-CM | POA: Diagnosis not present

## 2020-10-19 DIAGNOSIS — Z8616 Personal history of COVID-19: Secondary | ICD-10-CM | POA: Diagnosis not present

## 2020-10-19 DIAGNOSIS — N184 Chronic kidney disease, stage 4 (severe): Secondary | ICD-10-CM | POA: Diagnosis not present

## 2020-10-19 DIAGNOSIS — J90 Pleural effusion, not elsewhere classified: Secondary | ICD-10-CM | POA: Insufficient documentation

## 2020-10-19 DIAGNOSIS — J9611 Chronic respiratory failure with hypoxia: Secondary | ICD-10-CM | POA: Diagnosis not present

## 2020-10-19 LAB — BASIC METABOLIC PANEL
Anion gap: 12 (ref 5–15)
BUN: 45 mg/dL — ABNORMAL HIGH (ref 8–23)
CO2: 30 mmol/L (ref 22–32)
Calcium: 9.8 mg/dL (ref 8.9–10.3)
Chloride: 94 mmol/L — ABNORMAL LOW (ref 98–111)
Creatinine, Ser: 2.11 mg/dL — ABNORMAL HIGH (ref 0.44–1.00)
GFR, Estimated: 25 mL/min — ABNORMAL LOW (ref >60)
Glucose, Bld: 105 mg/dL — ABNORMAL HIGH (ref 70–99)
Potassium: 4.5 mmol/L (ref 3.5–5.1)
Sodium: 136 mmol/L (ref 135–145)

## 2020-10-19 MED ORDER — CARVEDILOL 6.25 MG PO TABS: 6.2500 mg | ORAL_TABLET | Freq: Two times a day (BID) | ORAL | 6 refills | Status: DC

## 2020-10-19 NOTE — Progress Notes (Signed)
PCP: Dr Bea Graff Primary Cardiologist: Dr Aundra Dubin   HPI: Ms Eisenhuth is a 64 year old African-American female with history of COVID 19 infection, chronic combined systolic and diastolic heart failure, persistent atrial fibrillation no longer on anticoagulation due to history of GI bleeding and chronic anemia, stage III CKD, hypertension, type 2 diabetes, history of provoked DVT/PE following orthopedic surgery s/p IVC filter, chronic hypoxic respiratory failure 2L/min home O2 baseline, obesity, GERD and prior h/o left sided breast cancer 30 years ago, treated w/ surgery, chemo + radiation.   She has had multiple hospitalizations in the last year.  She was admitted January 2021 for COVID-19 pneumonia w/ acute hypoxic respiratory failure.  She did not require intubation.  She was treated with steroids and remdesivir.  She was readmitted twice in 7/21 for CHF exacerbations complicated by pleural effusion requiring IR guided thoracentesis.  She was readmitted again 8/21 for recurrent CHF and diuresed with IV Lasix.  Echo 8/21 showed moderately reduced systolic function, EF 35 to 40% with mild concentric left ventricular hypertrophy and grade 2 diastolic dysfunction.  There is mild hypokinesis of the left ventricular, apical segment.  RV systolic function was normal.  No prior study for comparison.   Readmitted recurrent CHF exacerbation plus AKI on CKD.Previous creatinine last admit was 1.76.  Creatinine on this admission elevated at 2.06. Chest x-ray demonstrated cardiomegaly and pulmonary venous congestion with recurrent right-sided pleural effusion.  BNP 673.  CBC shows chronic anemia, hemoglobin 8.9.  Also with worsening thrombocytopenia.  Platelets down from 122K last month down to 84K today. Echo repeated. EF 40-45%. RV ok. RVSP 51 mmHg. Placed on milrinone to support RV and diuresis. Discharge weight 225 pounds. She was discharged on bidil but too expensive. Changed to Imdur + hydral.   She was seen last  visit for post hospital f/u and was doing well from volume and symptom standpoint. Wt was stable. Scr was also stable. No med changes were made. She was referred to EP for consideration for possible Watchman's device. Per Dr. Quentin Ore, given the patient's multiple severe medical comorbidities, she is not a  candidate for left atrial appendage occlusion given the risks associated with general anesthesia.  She returns back for f/u. Here w/ her husband. Reports doing well. Wt stable. Wt down 4 lb, 218>>214 lb today. She denies any significant dyspnea at rest and none w/ basic ADLs. BP is mildly elevated today at 152/70, however she has not yet taken her AM meds. Will take when she returns home. Reports full med compliance. No side effects. Making good urine, per pt report. She is in chronic afib. HR well controlled.    Echo 8/21 EF 35-40%, mod HK, G2DD, RV normal Echo 9/21 EF 40-45%, moderately elevated pulmonary artery systolic pressure,  13.0 mmHg. RV ok.    RIGHT HEART CATH   RHC Procedural Findings: Hemodynamics (mmHg) RA mean 9 RV 56/8 PA 54/17, mean 33 PCWP mean 20 Oxygen saturations: PA 63% AO 98% Cardiac Output (Fick) 6.69  Cardiac Index (Fick) 3.33 PVR 1.9 WU  Conclusion  1. Mildly elevated right and left heart filling pressures.  2. Pulmonary venous hypertension.  3. Preserved cardiac output    ROS: All systems negative except as listed in HPI, PMH and Problem List.  SH:  Social History   Socioeconomic History  . Marital status: Married    Spouse name: Not on file  . Number of children: Not on file  . Years of education: Not on file  . Highest  education level: Not on file  Occupational History  . Not on file  Tobacco Use  . Smoking status: Never Smoker  . Smokeless tobacco: Never Used  Vaping Use  . Vaping Use: Never used  Substance and Sexual Activity  . Alcohol use: Never  . Drug use: Never  . Sexual activity: Not on file  Other Topics Concern  . Not  on file  Social History Narrative  . Not on file   Social Determinants of Health   Financial Resource Strain:   . Difficulty of Paying Living Expenses: Not on file  Food Insecurity:   . Worried About Charity fundraiser in the Last Year: Not on file  . Ran Out of Food in the Last Year: Not on file  Transportation Needs: No Transportation Needs  . Lack of Transportation (Medical): No  . Lack of Transportation (Non-Medical): No  Physical Activity:   . Days of Exercise per Week: Not on file  . Minutes of Exercise per Session: Not on file  Stress:   . Feeling of Stress : Not on file  Social Connections: Socially Integrated  . Frequency of Communication with Friends and Family: More than three times a week  . Frequency of Social Gatherings with Friends and Family: Once a week  . Attends Religious Services: 1 to 4 times per year  . Active Member of Clubs or Organizations: No  . Attends Archivist Meetings: 1 to 4 times per year  . Marital Status: Married  Human resources officer Violence:   . Fear of Current or Ex-Partner: Not on file  . Emotionally Abused: Not on file  . Physically Abused: Not on file  . Sexually Abused: Not on file    FH:  Family History  Problem Relation Age of Onset  . Cancer Mother     Past Medical History:  Diagnosis Date  . Acute hypoxemic respiratory failure due to COVID-19 ([place]) 12/30/2019  . Anemia   . Arthritis   . Atrial fibrillation ([place])   . Chronic kidney disease   . Chronic respiratory failure with hypoxia, on home O2 therapy ([place])   . CKD (chronic kidney disease), stage III ([place])   . COVID-19   . Diabetes mellitus without complication ([place])   . Diastolic CHF ([place])   . GI bleed   . GI bleed   . Gout   . Hypertension   . Thrombocytopenia ([place])     Current Outpatient Medications  Medication Sig Dispense Refill  . acetaminophen (TYLENOL) 325 MG tablet Take 650 mg by mouth every 6 (six) hours as needed for mild pain or headache.     . allopurinol (ZYLOPRIM) 100 MG tablet Take 100 mg by mouth 2 (two) times daily.    Marland Kitchen aspirin EC 81 MG tablet Take 81 mg by mouth daily. Swallow whole.    . blood glucose meter kit and supplies Dispense based on patient and insurance preference. Use up to four times daily as directed. (FOR ICD-10 E10.9, E11.9). 1 each 0  . Blood Glucose Monitoring Suppl (FIFTY50 GLUCOSE METER 2.0) w/Device KIT See admin instructions.    . carvedilol (COREG) 6.25 MG tablet Take 1 tablet (6.25 mg total) by mouth 2 (two) times daily with a meal. 60 tablet 0  . ferrous sulfate 325 (65 FE) MG tablet Take 325 mg by mouth 2 (two) times daily with a meal.     . glipiZIDE (GLUCOTROL XL) 5 MG 24 hr tablet Take 5 mg by mouth  daily with breakfast.     . hydrALAZINE (APRESOLINE) 25 MG tablet Take 1 tablet (25 mg total) by mouth 3 (three) times daily. 90 tablet 3  . isosorbide mononitrate (IMDUR) 30 MG 24 hr tablet Take 1 tablet (30 mg total) by mouth daily. 30 tablet 3  . Menthol-Methyl Salicylate (MUSCLE RUB) 10-15 % CREA Apply 1 application topically as needed for muscle pain (knees).     . Multiple Vitamin (MULTIVITAMIN WITH MINERALS) TABS tablet Take 2 tablets by mouth daily. Alive gummy bears    . OXYGEN Inhale 2 L into the lungs continuous.    . pantoprazole (PROTONIX) 40 MG tablet Take 40 mg by mouth daily.    . potassium chloride SA (KLOR-CON) 20 MEQ tablet Take 2 tablets (40 mEq total) by mouth 2 (two) times daily. 120 tablet 0  . torsemide (DEMADEX) 20 MG tablet Take 2 tablets (40 mg total) by mouth 2 (two) times daily. 120 tablet 0   No current facility-administered medications for this encounter.    Vitals:   10/19/20 1007  BP: (!) 152/70  Pulse: (!) 53  SpO2: 97%  Weight: 97.3 kg     Wt Readings from Last 3 Encounters:  10/19/20 97.3 kg  10/03/20 98.4 kg  09/26/20 99.2 kg   PHYSICAL EXAM: General:  Well appearing, moderately obese. No respiratory difficulty HEENT: normal Neck: supple. no JVD.  Carotids 2+ bilat; no bruits. No lymphadenopathy or thyromegaly appreciated. Cor: PMI nondisplaced. Irregularly irregular rhythm, regular rate. No rubs, gallops or murmurs. Lungs: clear Abdomen: soft, nontender, nondistended. No hepatosplenomegaly. No bruits or masses. Good bowel sounds. Extremities: no cyanosis, clubbing, rash, edema Neuro: alert & oriented x 3, cranial nerves grossly intact. moves all 4 extremities w/o difficulty. Affect pleasant.   EKG: Afib CVR 59 bpm. Personally reviewed.   ASSESSMENT & PLAN: 1.Chronic systolic CHF: Prominent signs of RV failure.  I reviewed 9/21 echo, EF 40-45%, global hypokinesis, D-shaped IV septum suggestive of RV pressure/volume overload, RV poorly visualized but appeared normal in size with at least mild systolic dysfunction. Cause of cardiomyopathy is uncertain. She has history of COVID-19 1/21, cannot rule out viral myocarditis. Chemotherapy-mediated CMP is possible (not sure what chemo she had remotely for breast cancer). Cardiac MRI showed LV EF 41% with diffuse hypokinesis, RV mildly dilated but with EF 27% and D-shaped septum, nonspecific RV insertion site LGE.  Suspect RV > LV failure. Cardiac MRI is not suggestive of amyloidosis, no M-spike on myeloma panel. We have not ruled out CAD (cath deferred given lack of ischemic CP and CKD). She has had recurrent HF admissions this year.  Milrinone started primarily for RV support last admit 9/21 . Diuresed well on lasix gtt. RHC w/ mildly elevated right and left heart filling pressures and preserved CO.    - Volume status ok today. Wt stable. NYHA Class II - NYHA II. Functional ability limited by joint pain. She doesn't ambulate much.   - Continue torsemide 40 mg bid.   - Continue  Coreg to 6.25 mg bid.  - No ARNI/spironolactone for now with elevated creatinine.  - Continue hydralazine 25 mg three times a day and imdur 30 mg daily. Bidil too expensive  - Awaiting insurance approval for cardiomems    2. Chronic hypoxemic respiratory failure: Suspect OHS/OSA. She was using home oxygen prior to COVID-19 PNA.  - Continue supplemental oxygen - Set up for home sleep study for day time fatigue and snoring.  3. Atrial fibrillation: Chronic, all ECGs  since 7/21 show atrial fibrillation. She is not on anticoagulation due to GI bleeding from small bowel AVMs (recurrent) - She cannot be cardioverted at this point as she cannot be chronically anticoagulated. - She is well rate controlled. Continue current dose of carvedilol.   - Evaluated by Dr. Quentin Ore for [facility] but not a suitable candidate given multiple severe medical comorbidities and risks associated with general anesthesia. 4. GI bleeding: Recurrent, from small bowel AVMs.  - no longer on a/c  - denies any recent bleeding. H/H stable at last visit 10/21 5. Right pleural effusion: Suspect due to CHF.  Had right thoracentesis with 500 cc off on 9/23, pleural fluid protein suggested exudative though PF LDH did not.   - denies dyspnea. Lung exam normal  6. H/o DVT/PE: Provoked (post-surgery). No longer on a/c due to h/o significant GIB.Has IVC filter.  7. CKD: Stage IV. Baseline SCr ~2.2.  - Check BMP today  - refer to nephrology   Keep MD f/u next month.    Arrin Pintor  PA-C   10:15 AM

## 2020-10-19 NOTE — Patient Instructions (Addendum)
It was great to see you today! No medication changes are needed at this time.  Labs today We will only contact you if something comes back abnormal or we need to make some changes. Otherwise no news is good news!  Your provider has recommended that you have a home sleep study.  We have provided you with the equipment in our office today. Please download the app and follow the instructions. YOUR PIN NUMBER IS: [id number]. Once you have completed the test you just dispose of the equipment, the information is automatically uploaded to [place] via blue-tooth technology. If your test is positive for sleep apnea and you need a home CPAP machine you will be contacted by Dr Theodosia Blender office [facility]) to set this up.  Keep follow up as scheduled  If you have any questions or concerns before your next appointment please send [place] a message through [place] or call our office at [phone].    TO LEAVE A MESSAGE FOR THE NURSE SELECT OPTION 2, PLEASE LEAVE A MESSAGE INCLUDING: . YOUR NAME . DATE OF BIRTH . CALL BACK NUMBER . REASON FOR CALL**this is important as we prioritize the call backs  YOU WILL RECEIVE A CALL BACK THE SAME DAY AS LONG AS YOU CALL BEFORE 4:00 PM

## 2020-10-19 NOTE — Progress Notes (Signed)
ITAMAR home sleep study given to patient, all instructions explained, and CLOUDPAT registration complete.  

## 2020-10-24 ENCOUNTER — Other Ambulatory Visit: Payer: Self-pay | Admitting: Student

## 2020-10-30 DIAGNOSIS — D500 Iron deficiency anemia secondary to blood loss (chronic): Secondary | ICD-10-CM | POA: Diagnosis not present

## 2020-10-31 DIAGNOSIS — R195 Other fecal abnormalities: Secondary | ICD-10-CM | POA: Diagnosis not present

## 2020-10-31 DIAGNOSIS — D638 Anemia in other chronic diseases classified elsewhere: Secondary | ICD-10-CM | POA: Diagnosis not present

## 2020-10-31 DIAGNOSIS — D500 Iron deficiency anemia secondary to blood loss (chronic): Secondary | ICD-10-CM | POA: Diagnosis not present

## 2020-11-11 DIAGNOSIS — I509 Heart failure, unspecified: Secondary | ICD-10-CM | POA: Diagnosis not present

## 2020-11-14 ENCOUNTER — Telehealth: Payer: Self-pay | Admitting: Oncology

## 2020-11-14 NOTE — Telephone Encounter (Signed)
Patient Rescheduled from 12/10 to 12/20 due to Dr Bobby Rumpf being out-of-office that afternoon - Patient notified

## 2020-11-17 ENCOUNTER — Inpatient Hospital Stay: Payer: PPO

## 2020-11-17 ENCOUNTER — Inpatient Hospital Stay: Payer: PPO | Admitting: Oncology

## 2020-11-20 ENCOUNTER — Telehealth: Payer: Self-pay | Admitting: Oncology

## 2020-11-20 NOTE — Telephone Encounter (Signed)
Patient called to verify 12/20 Labs, New Patient Appt at 10:30 am

## 2020-11-23 ENCOUNTER — Ambulatory Visit (HOSPITAL_COMMUNITY)
Admission: RE | Admit: 2020-11-23 | Discharge: 2020-11-23 | Disposition: A | Discharge status: Home / Self Care | Payer: PPO | Source: Ambulatory Visit | Attending: Cardiology | Admitting: Cardiology

## 2020-11-23 ENCOUNTER — Other Ambulatory Visit: Payer: Self-pay

## 2020-11-23 ENCOUNTER — Encounter (HOSPITAL_COMMUNITY): Payer: Self-pay | Admitting: Cardiology

## 2020-11-23 ENCOUNTER — Other Ambulatory Visit (HOSPITAL_COMMUNITY): Payer: Self-pay | Admitting: *Deleted

## 2020-11-23 VITALS — BP 150/68 | HR 62 | Wt 218.0 lb

## 2020-11-23 DIAGNOSIS — N184 Chronic kidney disease, stage 4 (severe): Secondary | ICD-10-CM | POA: Insufficient documentation

## 2020-11-23 DIAGNOSIS — Z8616 Personal history of COVID-19: Secondary | ICD-10-CM | POA: Diagnosis not present

## 2020-11-23 DIAGNOSIS — I4821 Permanent atrial fibrillation: Secondary | ICD-10-CM

## 2020-11-23 DIAGNOSIS — J9611 Chronic respiratory failure with hypoxia: Secondary | ICD-10-CM | POA: Insufficient documentation

## 2020-11-23 DIAGNOSIS — J90 Pleural effusion, not elsewhere classified: Secondary | ICD-10-CM | POA: Diagnosis not present

## 2020-11-23 DIAGNOSIS — I5022 Chronic systolic (congestive) heart failure: Secondary | ICD-10-CM | POA: Insufficient documentation

## 2020-11-23 DIAGNOSIS — Z7982 Long term (current) use of aspirin: Secondary | ICD-10-CM | POA: Insufficient documentation

## 2020-11-23 DIAGNOSIS — Z86711 Personal history of pulmonary embolism: Secondary | ICD-10-CM | POA: Insufficient documentation

## 2020-11-23 DIAGNOSIS — I482 Chronic atrial fibrillation, unspecified: Secondary | ICD-10-CM | POA: Diagnosis not present

## 2020-11-23 DIAGNOSIS — K922 Gastrointestinal hemorrhage, unspecified: Secondary | ICD-10-CM | POA: Insufficient documentation

## 2020-11-23 DIAGNOSIS — Z79899 Other long term (current) drug therapy: Secondary | ICD-10-CM | POA: Diagnosis not present

## 2020-11-23 DIAGNOSIS — D631 Anemia in chronic kidney disease: Secondary | ICD-10-CM | POA: Diagnosis not present

## 2020-11-23 DIAGNOSIS — E669 Obesity, unspecified: Secondary | ICD-10-CM | POA: Insufficient documentation

## 2020-11-23 DIAGNOSIS — E1122 Type 2 diabetes mellitus with diabetic chronic kidney disease: Secondary | ICD-10-CM | POA: Insufficient documentation

## 2020-11-23 DIAGNOSIS — Z7984 Long term (current) use of oral hypoglycemic drugs: Secondary | ICD-10-CM | POA: Diagnosis not present

## 2020-11-23 DIAGNOSIS — I130 Hypertensive heart and chronic kidney disease with heart failure and stage 1 through stage 4 chronic kidney disease, or unspecified chronic kidney disease: Secondary | ICD-10-CM | POA: Insufficient documentation

## 2020-11-23 DIAGNOSIS — K219 Gastro-esophageal reflux disease without esophagitis: Secondary | ICD-10-CM | POA: Insufficient documentation

## 2020-11-23 DIAGNOSIS — Z853 Personal history of malignant neoplasm of breast: Secondary | ICD-10-CM | POA: Insufficient documentation

## 2020-11-23 DIAGNOSIS — Z86718 Personal history of other venous thrombosis and embolism: Secondary | ICD-10-CM | POA: Insufficient documentation

## 2020-11-23 LAB — CBC
HCT: 29.5 % — ABNORMAL LOW (ref 36.0–46.0)
Hemoglobin: 10 g/dL — ABNORMAL LOW (ref 12.0–15.0)
MCH: 29 pg (ref 26.0–34.0)
MCHC: 33.9 g/dL (ref 30.0–36.0)
MCV: 85.5 fL (ref 80.0–100.0)
Platelets: 147 10*3/uL — ABNORMAL LOW (ref 150–400)
RBC: 3.45 MIL/uL — ABNORMAL LOW (ref 3.87–5.11)
RDW: 16.1 % — ABNORMAL HIGH (ref 11.5–15.5)
WBC: 5.6 10*3/uL (ref 4.0–10.5)
nRBC: 0 % (ref 0.0–0.2)

## 2020-11-23 LAB — BASIC METABOLIC PANEL
Anion gap: 14 (ref 5–15)
BUN: 45 mg/dL — ABNORMAL HIGH (ref 8–23)
CO2: 26 mmol/L (ref 22–32)
Calcium: 9.5 mg/dL (ref 8.9–10.3)
Chloride: 97 mmol/L — ABNORMAL LOW (ref 98–111)
Creatinine, Ser: 2.28 mg/dL — ABNORMAL HIGH (ref 0.44–1.00)
GFR, Estimated: 23 mL/min — ABNORMAL LOW (ref >60)
Glucose, Bld: 133 mg/dL — ABNORMAL HIGH (ref 70–99)
Potassium: 4.9 mmol/L (ref 3.5–5.1)
Sodium: 137 mmol/L (ref 135–145)

## 2020-11-23 MED ORDER — HYDRALAZINE HCL 25 MG PO TABS: 37.5000 mg | ORAL_TABLET | Freq: Three times a day (TID) | ORAL | 3 refills | Status: DC

## 2020-11-23 MED ORDER — EMPAGLIFLOZIN 10 MG PO TABS
10.0000 mg | ORAL_TABLET | Freq: Every day | ORAL | 6 refills | Status: DC
Start: 2020-11-23 — End: 2021-08-01

## 2020-11-23 MED ORDER — ISOSORBIDE MONONITRATE ER 60 MG PO TB24: 60.0000 mg | ORAL_TABLET | Freq: Every day | ORAL | 3 refills | Status: DC

## 2020-11-23 MED ORDER — CLOPIDOGREL BISULFATE 75 MG PO TABS: 75.0000 mg | ORAL_TABLET | Freq: Every day | ORAL | 0 refills | Status: AC

## 2020-11-23 NOTE — Progress Notes (Signed)
PCP: Dr Shary Decamp HF Cardiology: Dr Shirlee Latch   HPI: Rita [name] is a 64 y.o. 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   She has had multiple hospitalizations in the last year.  She was admitted January 2021 for COVID-19 pneumonia w/ acute hypoxic respiratory failure.  She did not require intubation.  She was treated with steroids and remdesivir.  She was readmitted twice in 7/21 for CHF exacerbations complicated by pleural effusion requiring IR guided thoracentesis.  She was readmitted again 8/21 for recurrent CHF and diuresed with IV Lasix.  Echo 8/21 showed moderately reduced systolic function, EF 35 to 40% with mild concentric left ventricular hypertrophy and grade 2 diastolic dysfunction, mild hypokinesis of the LV apical segment, RV systolic function was normal.    Readmitted recurrent CHF exacerbation plus AKI on CKD in 9/21.  Previous creatinine last admit was 1.76.  Creatinine on this admission elevated at 2.06. Chest x-ray demonstrated cardiomegaly and pulmonary venous congestion with recurrent right-sided pleural effusion.  BNP 673.  CBC showed chronic anemia, hemoglobin 8.9.  Also with worsening thrombocytopenia.  Platelets down to 84K. Echo repeated with EF 40-45%, RV ok, PASP 51 mmHg. Placed on milrinone to support RV for diuresis. Discharge [name] 225 pounds. She was discharged on bidil but too expensive. Changed to Imdur + hydral. Cardiac MRI was done this admission, showed LV EF 41%, RV EF 27%, D-shaped septum, RV insertion site LGE (RV>LV failure), no  evidence for cardiac amyloidosis.   She has seen EP for Watchman evaluation given inability to take anticoagulation.  Dr. Lalla Brothers thought that she is not a  candidate for left atrial appendage occlusion given the risks associated with general anesthesia.  She returns for followup of CHF.  [name] has been stable at home.  She has a home sleep study but has not done it yet. She is overall feeling considerably better.  No dyspnea walking on flat ground for short distances.  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  No chest pain.  No orthopnea/PND.  She snores and has daytime sleepiness.   Labs (11/21): K 4.5, creatinine 2.11  ECG (personally reviewed): atrial fibrillation, IVCD 118 msec  PMH: 1. Chronic systolic CHF:  - Echo 8/21 EF 45-27%, mod HK, G2DD, RV normal - Echo 9/21 EF 40-45%, global hypokinesis, D-shaped septum, at least mildly decreased RV systolic function.  - Cardiac MRI (9/21): LV EF 41%, diffuse hypokinesis, D-shaped septum, RV mildly dilated with RV EF 27%, nonspecific RV insertion site LGE.  No evidence for cardiac amyloidosis.  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  - RHC (10/21): mean RA 9, PA 54/17 mean 33, mean PCWP 20, CI 3.33, PVR 1.9 WU 2. CKD stage IV 3. Type 2 diabetes 4. Pleural effusion s/p thoracentesis (due to CHF) 5. HTN 6. 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  7. COVID-19 infection 8. Atrial fibrillation: Chronic.  Not anticoagulated due to chronic anemia and GI bleeding.  - Not Watchman candidate per EP.  9. Anemia: Suspect due to chronic low grade GI bleeding (small bowel AVMs).  10. Suspect OSA/OHS: She is on home oxygen.  11. Left breast cancer: Treated remotely with chemo/radiation.  12. GERD 13. Chronic mild thrombocytopenia  ROS: All systems negative except as listed in HPI, PMH and Problem List.  Social History   Socioeconomic History  . Marital status: Married    Spouse name: Not on file  . Number of children: Not on file   . Years of education: Not on file  . Highest education level: Not on file  Occupational History  . Not on file  Tobacco Use  . Smoking status: Never Smoker  . Smokeless tobacco: Never Used  Vaping Use  . Vaping Use: Never used  Substance and Sexual Activity  . Alcohol use: Never  . Drug use: Never  . Sexual activity: Not on file  Other Topics Concern  . Not on file  Social History Narrative  . Not on file   Social Determinants of Health   Financial Resource Strain: Not on file  Food Insecurity: Not on file  Transportation Needs: No Transportation Needs  . Lack of Transportation (Medical): No  . Lack of Transportation (Non-Medical): No  Physical Activity: Not on file  Stress: Not on file  Social Connections: Socially Integrated  . Frequency of Communication with Friends and Family: More than three times a week  . Frequency of Social Gatherings with Friends and Family: Once a week  . Attends Religious Services: 1 to 4 times per year  . Active Member of Clubs or Organizations: No  . Attends Banker Meetings: 1 to 4 times per year  . Marital Status: Married  Catering manager Violence: Not on file    Family History  Problem Relation Age of Onset  . Cancer Mother      Current Outpatient Medications  Medication Sig Dispense Refill  . acetaminophen (TYLENOL) 325 MG tablet Take 650 mg by mouth every 6 (six) hours as needed for mild pain or headache.    . allopurinol (ZYLOPRIM) 100 MG tablet Take 100 mg by mouth 2 (two) times daily.    Marland Kitchen aspirin EC 81 MG tablet Take 81 mg by mouth daily. Swallow whole.    . blood glucose meter kit and supplies Dispense based on patient and insurance preference. Use up to four times daily as directed. (FOR ICD-10 E10.9, E11.9). 1 each 0  . Blood Glucose Monitoring Suppl (FIFTY50 GLUCOSE METER 2.0) w/Device KIT See admin instructions.    . carvedilol (COREG) 6.25 MG tablet Take 1 tablet (6.25 mg total) by mouth 2 (two) times daily  with a meal. 60 tablet 6  . ferrous sulfate 325 (65 FE) MG tablet Take 325 mg by mouth 2 (two) times daily with a meal.     . glipiZIDE (GLUCOTROL XL) 5 MG 24 hr tablet Take 5 mg by mouth daily with breakfast.     . Menthol-Methyl Salicylate (MUSCLE RUB) 10-15 % CREA Apply 1 application topically as needed for muscle pain (knees).     . Multiple Vitamin (MULTIVITAMIN WITH MINERALS) TABS tablet Take 2 tablets by mouth daily. Alive gummy bears    . OXYGEN Inhale into the lungs. As needed    . pantoprazole (PROTONIX) 40 MG tablet Take 40 mg by mouth daily.    . potassium chloride SA (KLOR-CON) 20 MEQ tablet Take 2 tablets (40 mEq total) by mouth 2 (two) times daily. 120 tablet 0  . torsemide (DEMADEX) 20 MG tablet Take 2 tablets (40 mg total) by mouth 2 (two) times daily. 120 tablet 0  . [START ON 12/25/2020]  clopidogrel (PLAVIX) 75 MG tablet Take 1 tablet (75 mg total) by mouth daily. 30 tablet 0  . empagliflozin (JARDIANCE) 10 MG TABS tablet Take 1 tablet (10 mg total) by mouth daily before breakfast. 30 tablet 6  . hydrALAZINE (APRESOLINE) 25 MG tablet Take 1.5 tablets (37.5 mg total) by mouth 3 (three) times daily. 135 tablet 3  . isosorbide mononitrate (IMDUR) 60 MG 24 hr tablet Take 1 tablet (60 mg total) by mouth daily. 30 tablet 3   No current facility-administered medications for this encounter.    Vitals:   11/23/20 1021  BP: (!) 150/68  Pulse: 62  SpO2: 98%  [name]: 98.9 kg (218 lb)     Wt Readings from Last 3 Encounters:  11/23/20 98.9 kg (218 lb)  10/19/20 97.3 kg (214 lb 9.6 oz)  10/03/20 98.4 kg (217 lb)   PHYSICAL EXAM: General: NAD Neck: JVP 8 cm, no thyromegaly or thyroid nodule.  Lungs: Clear to auscultation bilaterally with normal respiratory effort. CV: Nondisplaced PMI.  Heart irregular S1/S2, no S3/S4, 1/6 SEM RUSB.  No peripheral edema.  No carotid bruit.  Normal pedal pulses.  Abdomen: Soft, nontender, no hepatosplenomegaly, no distention.  Skin: Intact  without lesions or rashes.  Neurologic: Alert and oriented x 3.  Psych: Normal affect. Extremities: No clubbing or cyanosis.  HEENT: Normal.   ASSESSMENT & PLAN: 1.Chronic systolic CHF: Prominent signs of RV failure.  9/21 echo showed EF 40-45%, global hypokinesis, D-shaped IV septum suggestive of RV pressure/volume overload, RV poorly visualized but appeared normal in size with at least mild systolic dysfunction. Cause of cardiomyopathy is uncertain. She has history of COVID-19 1/21, cannot rule out viral myocarditis. Chemotherapy-mediated CMP is possible (not sure what chemo she had remotely for breast cancer). Cardiac MRI showed LV EF 41% with diffuse hypokinesis, RV mildly dilated but with EF 27% and D-shaped septum, nonspecific RV insertion site LGE.  Suspect RV > LV failure. Cardiac MRI is not suggestive of amyloidosis, no M-spike on myeloma panel. We have not ruled out CAD (cath deferred given lack of ischemic CP and CKD). She has had recurrent HF admissions this year.  Milrinone started primarily for RV support last admit 9/21 and titrated off. Doing better recently.  Stable [name], NYHA class II symptoms.  Not significantly volume overloaded on exam. - We discussed Cardiomems placement given multiple CHF admissions.  I explained risks/benefits and she agrees to procedure.    - Continue torsemide 40 mg bid.   - Continue Coreg to 6.25 mg bid.  - No ARNI/spironolactone for now with elevated creatinine.  - Increase hydralazine to 37.5 mg three times a day and Imdur to 60 mg daily. - Add Farxiga 10 mg daily given CHF and CKD. BMET today and in 10 days.  2. Chronic hypoxemic respiratory failure: Suspect OHS/OSA. She was using home oxygen prior to COVID-19 PNA.  - Continue supplemental oxygen - Set up for home sleep study for daytime fatigue and snoring.  3. Atrial fibrillation: Chronic, all ECGs since 7/21 show atrial fibrillation. She is not on anticoagulation due to GI bleeding from small  bowel AVMs (recurrent). Rate is controlled.  - She cannot be cardioverted at this point as she cannot be chronically anticoagulated. - Evaluated by Dr. Quentin Ore for [place] but not a suitable candidate given multiple severe medical comorbidities and risks associated with general anesthesia. 4. GI bleeding: Recurrent, from small bowel AVMs.  - Unable to be anticoagulated.  - CBC today.  5. Right pleural  effusion: Suspect due to CHF.  Has had thoracentesis.  6. H/o DVT/PE: Provoked (post-surgery). No longer on anticoagulation due to h/o significant GIB.Has IVC filter.  7. CKD: Stage IV. Baseline SCr ~2.2.  - BMET today.  - Needs to see nephrology.   Followup 1 month with APP.    Loralie Champagne 11/24/2020

## 2020-11-23 NOTE — Patient Instructions (Addendum)
Start Jardiance 10 mg Daily  Increase Hydralazine to 37.5 mg (1 & 1/2 tabs) Three times a day   Increase Isosorbide to 60 mg Daily  We have sent you in a prescription for Plavix 75 mg, DO NOT TAKE THIS UNTIL 12/26/20 **AFTER YOUR CARDIOMEMS IMPLANT** AND YOU WILL ONLY TAKE IT FOR 1 MONTH  Labs done today we will call you for abnormal results  Your physician recommends that you return for lab work in: 10 days, we have provided you a prescription to have this done in [place] recommends that you schedule a follow-up appointment in: 4-6 weeks   If you have any questions or concerns before your next appointment please send [place] a message through [place] or call our office at [phone].    TO LEAVE A MESSAGE FOR THE NURSE SELECT OPTION 2, PLEASE LEAVE A MESSAGE INCLUDING: . YOUR NAME . DATE OF BIRTH . CALL BACK NUMBER . REASON FOR CALL**this is important as we prioritize the call backs  [place] AS LONG AS YOU CALL BEFORE 4:00 PM  At the [facility], you and your health needs are our priority. As part of our continuing mission to provide you with exceptional heart care, we have created designated Provider Care Teams. These Care Teams include your primary Cardiologist (physician) and Advanced Practice Providers (APPs- Physician Assistants and Nurse Practitioners) who all work together to provide you with the care you need, when you need it.   You may see any of the following providers on your designated Care Team at your next follow up: Marland Kitchen Dr Glori Bickers . Dr Loralie Champagne . Darrick Grinder, NP . Lyda Jester, PA . Audry Riles, PharmD   Please be sure to bring in all your medications bottles to every appointment.      INSTRUCTIONS FOR CARDIOMEMS IMPLANT  You are scheduled for a Cardiac Catheterization on Monday, January 17 with Dr. Loralie Champagne.  1. Please arrive at the [facility] (Main Entrance A) at [facility]: [address] [place], [place] [address] at 8:30 AM (This time is two hours before your procedure to ensure your preparation). Free valet parking service is available.   Special note: Every effort is made to have your procedure done on time. Please understand that emergencies sometimes delay scheduled procedures.  2. Diet: Do not eat solid foods after midnight.  The patient may have clear liquids until 5am upon the day of the procedure.  3. COVID TEST: Friday 12/22/20 AT 8:45 AM, THIS IS A DRIVE THRU TEST LOCATED AT:   [place], [place]  4. Medication instructions in preparation for your procedure:    Monday 12/25/20 AM DO NOT TAKE: Glipizide or Jardiance  On the morning of your procedure, take your Aspirin and any morning medicines NOT listed above.  You may use sips of water.  5. Plan for one night stay--bring personal belongings. 6. Bring a current list of your medications and current insurance cards. 7. You MUST have a responsible person to drive you home. 8. Someone MUST be with you the first 24 hours after you arrive home or your discharge will be delayed. 9. Please wear clothes that are easy to get on and off and wear slip-on shoes.  Thank you for allowing [place] to care for you!   -- [place] [facility]

## 2020-11-26 ENCOUNTER — Other Ambulatory Visit: Payer: Self-pay | Admitting: Oncology

## 2020-11-26 DIAGNOSIS — D649 Anemia, unspecified: Secondary | ICD-10-CM

## 2020-11-26 NOTE — Progress Notes (Signed)
[place]  [address] [place],  [place]  [address] [phone]  Clinic Day:  11/27/2020  Referring physician: [name], MD   HISTORY OF PRESENT ILLNESS:  The patient is a 64 y.o. female with anemia.  Labs in the past have shown evidence of renal insufficiency.  He also has had GI bleeds to where the patient claims she has been transfused over 20 units of blood during this calendar year.  A capsule endoscopy did show evidence of a small GI bleed.  At that time, the patient was on anticoagulation due to developing a pulmonary embolus in her past.  However, due to her severe anemia and recurrent GI bleeds, it was recommended that her anticoagulation be stopped.  Since her anticoagulation was discontinued a few months ago, she has not had any further GI bleeds or other forms of blood loss.  She claims to be feeling the best she has in months.    PHYSICAL EXAM:  Blood pressure (!) 160/73, pulse 73, temperature 97.8 F (36.6 C), resp. rate 16, height 5\' 1"  (1.549 m), weight 215 lb 9.6 oz (97.8 kg), SpO2 95 %. Wt Readings from Last 3 Encounters:  11/27/20 215 lb 9.6 oz (97.8 kg)  11/23/20 218 lb (98.9 kg)  10/19/20 214 lb 9.6 oz (97.3 kg)   Body mass index is 40.74 kg/m. Performance status (ECOG): 1 Physical Exam Constitutional:      Appearance: Normal appearance. She is not ill-appearing.  HENT:     Mouth/Throat:     Mouth: Mucous membranes are moist.     Pharynx: Oropharynx is clear. No oropharyngeal exudate or posterior oropharyngeal erythema.  Cardiovascular:     Rate and Rhythm: Normal rate and regular rhythm.     Heart sounds: No murmur heard. No friction rub. No gallop.   Pulmonary:     Effort: Pulmonary effort is normal. No respiratory distress.     Breath sounds: Normal breath sounds. No wheezing, rhonchi or rales.  Chest:  Breasts:     Right: No axillary adenopathy or supraclavicular adenopathy.     Left: No axillary  adenopathy or supraclavicular adenopathy.    Abdominal:     General: Bowel sounds are normal. There is no distension.     Palpations: Abdomen is soft. There is no mass.     Tenderness: There is no abdominal tenderness.  Musculoskeletal:        General: No swelling.     Right lower leg: No edema.     Left lower leg: No edema.  Lymphadenopathy:     Cervical: No cervical adenopathy.     Upper Body:     Right upper body: No supraclavicular or axillary adenopathy.     Left upper body: No supraclavicular or axillary adenopathy.     Lower Body: No right inguinal adenopathy. No left inguinal adenopathy.  Skin:    General: Skin is warm.     Coloration: Skin is not jaundiced.     Findings: No lesion or rash.  Neurological:     General: No focal deficit present.     Mental Status: She is alert and oriented to person, place, and time. Mental status is at baseline.     Cranial Nerves: Cranial nerves are intact.  Psychiatric:        Mood and Affect: Mood normal.        Behavior: Behavior normal.        Thought Content: Thought content normal.  LABS:   CBC Latest Ref Rng & Units 11/27/2020 11/23/2020 09/26/2020  WBC - 4.7 5.6 4.7  Hemoglobin 12.0 - 16.0 10.3(A) 10.0(L) 11.0(L)  Hematocrit 36 - 46 32(A) 29.5(L) 35.3(L)  Platelets 150 - 399 166 147(L) 174   CMP Latest Ref Rng & Units 11/27/2020 11/23/2020 10/19/2020  Glucose 70 - 99 mg/dL - 133(H) 105(H)  BUN 4 - 21 51(A) 45(H) 45(H)  Creatinine 0.5 - 1.1 2.4(A) 2.28(H) 2.11(H)  Sodium 137 - 147 139 137 136  Potassium 3.4 - 5.3 4.3 4.9 4.5  Chloride 99 - 108 101 97(L) 94(L)  CO2 13 - 22 28(A) 26 30  Calcium 8.7 - 10.7 9.5 9.5 9.8  Total Protein 6.5 - 8.1 g/dL - - -  Total Bilirubin 0.3 - 1.2 mg/dL - - -  Alkaline Phos 25 - 125 168(A) - -  AST 13 - 35 40(A) - -  ALT 7 - 35 34 - -       ASSESSMENT & PLAN:  Assessment/Plan:  A 64 y.o. female with anemia.  When evaluating her recent labs, I am pleased as her hemoglobin is  above 10.  Based upon her labs today, she does not have any nutritional deficiencies factoring into her anemia.  She also has not had any recent blood loss.  The question now is how much of her anemia can be attributed to her renal insufficiency.  Ultimately, she may need to be evaluated by a nephrologist to determine the best ways to preserve the renal function she has left.  Clinically, she appears to be doing much better.  I will see her back in 4 months for repeat clinical assessment.  The patient understands all the plans discussed today and is in agreement with them.     Anastaisa Wooding Macarthur Critchley, MD

## 2020-11-27 ENCOUNTER — Telehealth: Payer: Self-pay | Admitting: Oncology

## 2020-11-27 ENCOUNTER — Telehealth: Payer: Self-pay

## 2020-11-27 ENCOUNTER — Other Ambulatory Visit: Payer: Self-pay | Admitting: Oncology

## 2020-11-27 ENCOUNTER — Inpatient Hospital Stay: Payer: PPO | Attending: Oncology

## 2020-11-27 ENCOUNTER — Other Ambulatory Visit: Payer: Self-pay

## 2020-11-27 ENCOUNTER — Other Ambulatory Visit: Payer: Self-pay | Admitting: Hematology and Oncology

## 2020-11-27 ENCOUNTER — Inpatient Hospital Stay (INDEPENDENT_AMBULATORY_CARE_PROVIDER_SITE_OTHER): Payer: PPO | Admitting: Oncology

## 2020-11-27 VITALS — BP 160/73 | HR 73 | Temp 97.8°F | Resp 16 | Ht 61.0 in | Wt 215.6 lb

## 2020-11-27 DIAGNOSIS — D649 Anemia, unspecified: Secondary | ICD-10-CM

## 2020-11-27 DIAGNOSIS — E538 Deficiency of other specified B group vitamins: Secondary | ICD-10-CM | POA: Diagnosis not present

## 2020-11-27 DIAGNOSIS — Z0001 Encounter for general adult medical examination with abnormal findings: Secondary | ICD-10-CM | POA: Diagnosis not present

## 2020-11-27 LAB — CBC AND DIFFERENTIAL
HCT: 32 — AB (ref 36–46)
Hemoglobin: 10.3 — AB (ref 12.0–16.0)
Neutrophils Absolute: 2.91
Platelets: 166 (ref 150–399)
WBC: 4.7

## 2020-11-27 LAB — HEPATIC FUNCTION PANEL
ALT: 34 (ref 7–35)
AST: 40 — AB (ref 13–35)
Alkaline Phosphatase: 168 — AB (ref 25–125)
Bilirubin, Total: 1.9

## 2020-11-27 LAB — BASIC METABOLIC PANEL
BUN: 51 — AB (ref 4–21)
CO2: 28 — AB (ref 13–22)
Chloride: 101 (ref 99–108)
Creatinine: 2.4 — AB (ref 0.5–1.1)
Glucose: 106
Potassium: 4.3 (ref 3.4–5.3)
Sodium: 139 (ref 137–147)

## 2020-11-27 LAB — CBC: RBC: 3.64 — AB (ref 3.87–5.11)

## 2020-11-27 LAB — COMPREHENSIVE METABOLIC PANEL
Albumin: 4.4 (ref 3.5–5.0)
Calcium: 9.5 (ref 8.7–10.7)

## 2020-11-27 NOTE — Telephone Encounter (Signed)
12/20 los next appt given to patient

## 2020-11-27 NOTE — Telephone Encounter (Signed)
I called pt @ 1551, to make her aware that her iron results are not back as of yet. He will call tomorrow. [phone]

## 2020-11-28 NOTE — Telephone Encounter (Signed)
I called pt, notified her of Dr Bobby Rumpf response. She does have appt with kidney specialist next month. [phone].   Dr Bobby Rumpf reviewed lab results. He states, "Tell her that she is not iron deficient, & her B12 and folate levels are good too. However, she does have fairly significant kidney disease. Find out if she has seen a kidney specialist

## 2020-12-04 ENCOUNTER — Encounter: Payer: Self-pay | Admitting: Oncology

## 2020-12-06 DIAGNOSIS — I130 Hypertensive heart and chronic kidney disease with heart failure and stage 1 through stage 4 chronic kidney disease, or unspecified chronic kidney disease: Secondary | ICD-10-CM | POA: Diagnosis not present

## 2020-12-06 DIAGNOSIS — N1832 Chronic kidney disease, stage 3b: Secondary | ICD-10-CM | POA: Diagnosis not present

## 2020-12-06 DIAGNOSIS — I129 Hypertensive chronic kidney disease with stage 1 through stage 4 chronic kidney disease, or unspecified chronic kidney disease: Secondary | ICD-10-CM | POA: Diagnosis not present

## 2020-12-06 DIAGNOSIS — I5042 Chronic combined systolic (congestive) and diastolic (congestive) heart failure: Secondary | ICD-10-CM | POA: Diagnosis not present

## 2020-12-06 DIAGNOSIS — N183 Chronic kidney disease, stage 3 unspecified: Secondary | ICD-10-CM | POA: Diagnosis not present

## 2020-12-06 DIAGNOSIS — E1122 Type 2 diabetes mellitus with diabetic chronic kidney disease: Secondary | ICD-10-CM | POA: Diagnosis not present

## 2020-12-06 DIAGNOSIS — I10 Essential (primary) hypertension: Secondary | ICD-10-CM | POA: Diagnosis not present

## 2020-12-06 DIAGNOSIS — D500 Iron deficiency anemia secondary to blood loss (chronic): Secondary | ICD-10-CM | POA: Diagnosis not present

## 2020-12-06 DIAGNOSIS — J9611 Chronic respiratory failure with hypoxia: Secondary | ICD-10-CM | POA: Diagnosis not present

## 2020-12-06 DIAGNOSIS — Z6841 Body Mass Index (BMI) 40.0 and over, adult: Secondary | ICD-10-CM | POA: Diagnosis not present

## 2020-12-12 DIAGNOSIS — I509 Heart failure, unspecified: Secondary | ICD-10-CM | POA: Diagnosis not present

## 2020-12-15 ENCOUNTER — Telehealth (HOSPITAL_COMMUNITY): Payer: Self-pay | Admitting: Surgery

## 2020-12-15 NOTE — Telephone Encounter (Signed)
I contacted patient back after she called to the triage line concerned that she may have a "kidney infection" after recently starting [place].  She says that she has some mild flank pain and frequency.  No burning.

## 2020-12-15 NOTE — Telephone Encounter (Signed)
She needs to get urinalysis and culture to see what's going on.

## 2020-12-18 NOTE — Telephone Encounter (Signed)
Left VM for pt to schedule lab appt.

## 2020-12-20 DIAGNOSIS — N1832 Chronic kidney disease, stage 3b: Secondary | ICD-10-CM | POA: Diagnosis not present

## 2020-12-20 DIAGNOSIS — I4891 Unspecified atrial fibrillation: Secondary | ICD-10-CM | POA: Diagnosis not present

## 2020-12-20 DIAGNOSIS — E876 Hypokalemia: Secondary | ICD-10-CM | POA: Diagnosis not present

## 2020-12-20 DIAGNOSIS — I129 Hypertensive chronic kidney disease with stage 1 through stage 4 chronic kidney disease, or unspecified chronic kidney disease: Secondary | ICD-10-CM | POA: Diagnosis not present

## 2020-12-20 DIAGNOSIS — I5022 Chronic systolic (congestive) heart failure: Secondary | ICD-10-CM | POA: Diagnosis not present

## 2020-12-20 DIAGNOSIS — N184 Chronic kidney disease, stage 4 (severe): Secondary | ICD-10-CM | POA: Diagnosis not present

## 2020-12-20 DIAGNOSIS — E1122 Type 2 diabetes mellitus with diabetic chronic kidney disease: Secondary | ICD-10-CM | POA: Diagnosis not present

## 2020-12-20 DIAGNOSIS — Z8719 Personal history of other diseases of the digestive system: Secondary | ICD-10-CM | POA: Diagnosis not present

## 2020-12-22 ENCOUNTER — Other Ambulatory Visit (HOSPITAL_COMMUNITY)
Admission: RE | Admit: 2020-12-22 | Discharge: 2020-12-22 | Disposition: A | Discharge status: Home / Self Care | Payer: PPO | Source: Ambulatory Visit | Attending: Cardiology | Admitting: Cardiology

## 2020-12-22 DIAGNOSIS — Z20822 Contact with and (suspected) exposure to covid-19: Secondary | ICD-10-CM | POA: Insufficient documentation

## 2020-12-22 DIAGNOSIS — Z01812 Encounter for preprocedural laboratory examination: Secondary | ICD-10-CM | POA: Insufficient documentation

## 2020-12-22 LAB — SARS CORONAVIRUS 2 (TAT 6-24 HRS): SARS Coronavirus 2: NEGATIVE

## 2020-12-23 ENCOUNTER — Other Ambulatory Visit: Payer: Self-pay | Admitting: Student

## 2020-12-25 ENCOUNTER — Ambulatory Visit (HOSPITAL_COMMUNITY): Admission: RE | Admit: 2020-12-25 | Payer: PPO | Source: Home / Self Care | Admitting: Cardiology

## 2020-12-25 ENCOUNTER — Encounter (HOSPITAL_COMMUNITY): Admission: RE | Payer: Self-pay | Source: Home / Self Care

## 2020-12-25 SURGERY — PRESSURE SENSOR/CARDIOMEMS
Anesthesia: LOCAL

## 2020-12-26 ENCOUNTER — Other Ambulatory Visit: Payer: Self-pay

## 2020-12-26 ENCOUNTER — Ambulatory Visit (HOSPITAL_COMMUNITY)
Admission: RE | Disposition: A | Discharge status: Home / Self Care | Payer: Self-pay | Source: Home / Self Care | Attending: Cardiology

## 2020-12-26 ENCOUNTER — Encounter (HOSPITAL_COMMUNITY): Payer: Self-pay | Admitting: Cardiology

## 2020-12-26 ENCOUNTER — Ambulatory Visit (HOSPITAL_COMMUNITY)
Admission: RE | Admit: 2020-12-26 | Discharge: 2020-12-26 | Disposition: A | Discharge status: Home / Self Care | Payer: PPO | Attending: Cardiology | Admitting: Cardiology

## 2020-12-26 DIAGNOSIS — Z888 Allergy status to other drugs, medicaments and biological substances status: Secondary | ICD-10-CM | POA: Insufficient documentation

## 2020-12-26 DIAGNOSIS — Z79899 Other long term (current) drug therapy: Secondary | ICD-10-CM | POA: Insufficient documentation

## 2020-12-26 DIAGNOSIS — Z95818 Presence of other cardiac implants and grafts: Secondary | ICD-10-CM

## 2020-12-26 DIAGNOSIS — I272 Pulmonary hypertension, unspecified: Secondary | ICD-10-CM | POA: Insufficient documentation

## 2020-12-26 DIAGNOSIS — Z7982 Long term (current) use of aspirin: Secondary | ICD-10-CM | POA: Diagnosis not present

## 2020-12-26 DIAGNOSIS — Z885 Allergy status to narcotic agent status: Secondary | ICD-10-CM | POA: Diagnosis not present

## 2020-12-26 DIAGNOSIS — I110 Hypertensive heart disease with heart failure: Secondary | ICD-10-CM | POA: Diagnosis not present

## 2020-12-26 DIAGNOSIS — I5022 Chronic systolic (congestive) heart failure: Secondary | ICD-10-CM | POA: Diagnosis not present

## 2020-12-26 DIAGNOSIS — Z7902 Long term (current) use of antithrombotics/antiplatelets: Secondary | ICD-10-CM | POA: Insufficient documentation

## 2020-12-26 DIAGNOSIS — Z7984 Long term (current) use of oral hypoglycemic drugs: Secondary | ICD-10-CM | POA: Insufficient documentation

## 2020-12-26 DIAGNOSIS — I5032 Chronic diastolic (congestive) heart failure: Secondary | ICD-10-CM | POA: Diagnosis not present

## 2020-12-26 HISTORY — PX: PRESSURE SENSOR/CARDIOMEMS: CATH118258

## 2020-12-26 HISTORY — DX: Presence of other cardiac implants and grafts: Z95.818

## 2020-12-26 LAB — POCT I-STAT EG7
Acid-Base Excess: 4 mmol/L — ABNORMAL HIGH (ref 0.0–2.0)
Acid-Base Excess: 5 mmol/L — ABNORMAL HIGH (ref 0.0–2.0)
Bicarbonate: 29.5 mmol/L — ABNORMAL HIGH (ref 20.0–28.0)
Bicarbonate: 30.4 mmol/L — ABNORMAL HIGH (ref 20.0–28.0)
Calcium, Ion: 1.12 mmol/L — ABNORMAL LOW (ref 1.15–1.40)
Calcium, Ion: 1.18 mmol/L (ref 1.15–1.40)
HCT: 29 % — ABNORMAL LOW (ref 36.0–46.0)
HCT: 29 % — ABNORMAL LOW (ref 36.0–46.0)
Hemoglobin: 9.9 g/dL — ABNORMAL LOW (ref 12.0–15.0)
Hemoglobin: 9.9 g/dL — ABNORMAL LOW (ref 12.0–15.0)
O2 Saturation: 69 %
O2 Saturation: 69 %
Potassium: 3.3 mmol/L — ABNORMAL LOW (ref 3.5–5.1)
Potassium: 3.5 mmol/L (ref 3.5–5.1)
Sodium: 145 mmol/L (ref 135–145)
Sodium: 145 mmol/L (ref 135–145)
TCO2: 31 mmol/L (ref 22–32)
TCO2: 32 mmol/L (ref 22–32)
pCO2, Ven: 46.9 mmHg (ref 44.0–60.0)
pCO2, Ven: 48.6 mmHg (ref 44.0–60.0)
pH, Ven: 7.405 (ref 7.250–7.430)
pH, Ven: 7.406 (ref 7.250–7.430)
pO2, Ven: 36 mmHg (ref 32.0–45.0)
pO2, Ven: 37 mmHg (ref 32.0–45.0)

## 2020-12-26 LAB — GLUCOSE, CAPILLARY: Glucose-Capillary: 156 mg/dL — ABNORMAL HIGH (ref 70–99)

## 2020-12-26 LAB — BASIC METABOLIC PANEL
Anion gap: 10 (ref 5–15)
BUN: 37 mg/dL — ABNORMAL HIGH (ref 8–23)
CO2: 28 mmol/L (ref 22–32)
Calcium: 9 mg/dL (ref 8.9–10.3)
Chloride: 102 mmol/L (ref 98–111)
Creatinine, Ser: 2.04 mg/dL — ABNORMAL HIGH (ref 0.44–1.00)
GFR, Estimated: 26 mL/min — ABNORMAL LOW (ref >60)
Glucose, Bld: 158 mg/dL — ABNORMAL HIGH (ref 70–99)
Potassium: 3.9 mmol/L (ref 3.5–5.1)
Sodium: 140 mmol/L (ref 135–145)

## 2020-12-26 SURGERY — PRESSURE SENSOR/CARDIOMEMS
Anesthesia: LOCAL

## 2020-12-26 MED ORDER — HEPARIN (PORCINE) IN NACL 1000-0.9 UT/500ML-% IV SOLN
INTRAVENOUS | Status: DC | PRN
  Administered 2020-12-26: 500 mL

## 2020-12-26 MED ORDER — SODIUM CHLORIDE 0.9% FLUSH: 3.0000 mL | INTRAVENOUS | Status: DC | PRN

## 2020-12-26 MED ORDER — LABETALOL HCL 5 MG/ML IV SOLN: 10.0000 mg | INTRAVENOUS | Status: DC | PRN

## 2020-12-26 MED ORDER — SODIUM CHLORIDE 0.9% FLUSH: 3.0000 mL | Freq: Two times a day (BID) | INTRAVENOUS | Status: DC

## 2020-12-26 MED ORDER — LIDOCAINE HCL (PF) 1 % IJ SOLN
INTRAMUSCULAR | Status: AC
  Filled 2020-12-26: qty 30

## 2020-12-26 MED ORDER — FENTANYL CITRATE (PF) 100 MCG/2ML IJ SOLN
INTRAMUSCULAR | Status: DC | PRN
  Administered 2020-12-26: 50 ug via INTRAVENOUS

## 2020-12-26 MED ORDER — ONDANSETRON HCL 4 MG/2ML IJ SOLN: 4.0000 mg | Freq: Four times a day (QID) | INTRAMUSCULAR | Status: DC | PRN

## 2020-12-26 MED ORDER — TORSEMIDE 20 MG PO TABS: ORAL_TABLET | ORAL | 0 refills | Status: DC

## 2020-12-26 MED ORDER — MIDAZOLAM HCL 2 MG/2ML IJ SOLN
INTRAMUSCULAR | Status: DC | PRN
  Administered 2020-12-26: 1 mg via INTRAVENOUS

## 2020-12-26 MED ORDER — SODIUM CHLORIDE 0.9 % IV SOLN: 250.0000 mL | INTRAVENOUS | Status: DC | PRN

## 2020-12-26 MED ORDER — FENTANYL CITRATE (PF) 100 MCG/2ML IJ SOLN
INTRAMUSCULAR | Status: AC
  Filled 2020-12-26: qty 2

## 2020-12-26 MED ORDER — HEPARIN (PORCINE) IN NACL 1000-0.9 UT/500ML-% IV SOLN
INTRAVENOUS | Status: AC
  Filled 2020-12-26: qty 500

## 2020-12-26 MED ORDER — ACETAMINOPHEN 325 MG PO TABS: 650.0000 mg | ORAL_TABLET | ORAL | Status: DC | PRN

## 2020-12-26 MED ORDER — HYDRALAZINE HCL 20 MG/ML IJ SOLN: 10.0000 mg | INTRAMUSCULAR | Status: DC | PRN

## 2020-12-26 MED ORDER — SODIUM CHLORIDE 0.9 % IV SOLN: INTRAVENOUS | Status: DC

## 2020-12-26 MED ORDER — CLOPIDOGREL BISULFATE 75 MG PO TABS: 75.0000 mg | ORAL_TABLET | Freq: Every day | ORAL | 0 refills | Status: DC

## 2020-12-26 MED ORDER — IOHEXOL 350 MG/ML SOLN
INTRAVENOUS | Status: DC | PRN
  Administered 2020-12-26: 15 mL

## 2020-12-26 MED ORDER — MIDAZOLAM HCL 2 MG/2ML IJ SOLN
INTRAMUSCULAR | Status: AC
  Filled 2020-12-26: qty 2

## 2020-12-26 MED ORDER — LIDOCAINE HCL (PF) 1 % IJ SOLN
INTRAMUSCULAR | Status: DC | PRN
  Administered 2020-12-26: 15 mL

## 2020-12-26 SURGICAL SUPPLY — 15 items
CARDIOMEMS PA SENSOR W/DELIVER (Prosthesis & Implant Heart) ×2 IMPLANT
CATH SWAN GANZ 7F STRAIGHT (CATHETERS) ×2 IMPLANT
DILATOR VESSEL 10FR 20CM (INTRODUCER) ×2 IMPLANT
DILATOR VESSEL 38 20CM 7FR (INTRODUCER) ×2 IMPLANT
DILATOR VESSEL 38 20CM 9FR (INTRODUCER) ×2 IMPLANT
KIT MICROPUNCTURE NIT STIFF (SHEATH) ×2 IMPLANT
PROTECTION STATION PRESSURIZED (MISCELLANEOUS) ×2
SENSOR CARDIOMEMS PA W/DELIVER (Prosthesis & Implant Heart) ×1 IMPLANT
SHEATH FAST CATH 12F 12CM (SHEATH) ×2 IMPLANT
SHEATH PROBE COVER 6X72 (BAG) ×2 IMPLANT
STATION PROTECTION PRESSURIZED (MISCELLANEOUS) ×1 IMPLANT
TUBING ART PRESS 72  MALE/FEM (TUBING) ×2
TUBING ART PRESS 72 MALE/FEM (TUBING) ×1 IMPLANT
WIRE EMERALD 3MM-J .035X150CM (WIRE) ×2 IMPLANT
WIRE NITREX .018X300 STIFF (WIRE) ×2 IMPLANT

## 2020-12-26 NOTE — Discharge Instructions (Signed)
1. Increase torsemide to 40 mg in the morning and 20 mg in the afternoon (2 tabs in morning and 1 tab in afternoon).  2. Start Plavix (clopidogrel) 75 mg daily. Take this for 30 days then stop.  3. I will arrange for you to get labs done next week.   venogram, Care After This sheet gives you information about how to care for yourself after your procedure. Your health care provider may also give you more specific instructions. If you have problems or questions, contact your health care provider. What can I expect after the procedure? After the procedure, it is common to have:  Bruising and tenderness at the catheter insertion area.  A collection of blood (hematoma) at the insertion area. This may feel like a small lump under the skin at the insertion site. Follow these instructions at home: Insertion site care  Follow instructions from your health care provider about how to take care of your insertion site. Make sure you: ? Wash your hands with soap and water before and after you change your bandage (dressing). If soap and water are not available, use hand sanitizer. ? Change your dressing as told by your health care provider.  Do not take baths, swim, or use a hot tub until your health care provider approves.  You may shower 24-48 hours after the procedure, or as told by your health care provider. To clean the insertion site: ? Gently wash the area with plain soap and water. ? Pat the area dry with a clean towel. ? Do not rub the site. This may cause bleeding.  Check your insertion site every day for signs of infection. Check for: ? Redness, swelling, or pain. ? Fluid or blood. ? Warmth. ? Pus or a bad smell.  Do not apply powder or lotion to the site. Keep the site clean and dry.   Activity  Do not drive for 24 hours if you were given a sedative during your procedure.  Rest as told by your health care provider, usually for 1-2 days.  Do not lift anything that is heavier than  10 lb (4.5 kg), or the limit that you are told, until your health care provider says that it is safe.  If the insertion site was in your leg, try to avoid stairs for a few days.  Return to your normal activities as told by your health care provider, usually in about a week. Ask your health care provider what activities are safe for you. General instructions  If your insertion site starts bleeding, lie flat and put pressure on the site. If the bleeding does not stop, get help right away. This is a medical emergency.  Take over-the-counter and prescription medicines only as told by your health care provider.  Drink enough fluid to keep your urine pale yellow. This helps flush the contrast dye from your body.  Keep all follow-up visits as told by your health care provider. This is important.   Contact a health care provider if:  You have a fever or chills.  You have redness, swelling, or pain around your insertion site.  You have fluid or blood coming from your insertion site.  Your insertion site feels warm to the touch.  You have pus or a bad smell coming from your insertion site.  You have more bruising around the insertion site. Get help right away if you have:  A problem with the insertion area, such as: ? The area swells fast or bleeds  even after you apply pressure. ? The area becomes pale, cool, tingly, or numb.  Chest pain.  Trouble breathing.  A rash.  Any symptoms of a stroke. "BE FAST" is an easy way to remember the main warning signs of a stroke: ? B - Balance. Signs are dizziness, sudden trouble walking, or loss of balance. ? E - Eyes. Signs are trouble seeing or a sudden change in vision. ? F - Face. Signs are sudden weakness or loss of feeling of the face, or the face or eyelid drooping on one side. ? A - Arms. Signs are weakness or loss of feeling in an arm. This happens suddenly and usually on one side of the body. ? S - Speech. Signs are sudden trouble  speaking, slurred speech, or trouble understanding what people say. ? T - Time. Time to call emergency services. Write down what time symptoms started.  You have other signs of a stroke, such as: ? A sudden, severe headache with no known cause. ? Nausea or vomiting. ? Seizure. These symptoms may represent a serious problem that is an emergency. Do not wait to see if the symptoms will go away. Get medical help right away. Call your local emergency services (911 in the [place] Do not drive yourself to the hospital. Summary  It is common to have bruising and tenderness at the catheter insertion area.  Do not take baths, swim, or use a hot tub until your health care provider approves. You may shower 24-48 hours after the procedure or as told.  It is important to rest and drink plenty of fluids.  If the insertion site bleeds, lie flat and put pressure on the site. If the bleeding continues, get help right away. This is a medical emergency. This information is not intended to replace advice given to you by your health care provider. Make sure you discuss any questions you have with your health care provider. Document Revised: 09/29/2019 Document Reviewed: 09/29/2019 Elsevier Patient Education  [place].

## 2020-12-26 NOTE — H&P (Signed)
Advanced Heart Failure Team History and Physical Note   PCP:  [name], MD  PCP-Cardiology: No primary care provider on file.     Reason for Admission: Cardiomems placement   HPI:    65 yo with history of diastolic CHF comes in today for RHC and Cardiomems placement.     Review of Systems: All systems reviewed and negative except as per HPI  Home Medications Prior to Admission medications   Medication Sig Start Date End Date Taking? Authorizing Provider  allopurinol (ZYLOPRIM) 100 MG tablet Take 100 mg by mouth 2 (two) times daily.   Yes [provider]  aspirin EC 81 MG tablet Take 81 mg by mouth daily. Swallow whole.   Yes [provider]  carvedilol (COREG) 6.25 MG tablet Take 1 tablet (6.25 mg total) by mouth 2 (two) times daily with a meal. 10/19/20 11/18/20 Yes Lyda Jester M, PA-C  ferrous sulfate 325 (65 FE) MG tablet Take 325 mg by mouth 2 (two) times daily with a meal.    Yes [provider]  glipiZIDE (GLUCOTROL XL) 5 MG 24 hr tablet Take 5 mg by mouth daily with breakfast.  06/08/20  Yes [provider]  hydrALAZINE (APRESOLINE) 25 MG tablet Take 1.5 tablets (37.5 mg total) by mouth 3 (three) times daily. Patient taking differently: Take 25 mg by mouth 3 (three) times daily. 11/23/20  Yes Larey Dresser, MD  isosorbide mononitrate (IMDUR) 60 MG 24 hr tablet Take 1 tablet (60 mg total) by mouth daily. Patient taking differently: Take 30 mg by mouth daily. 11/23/20  Yes Larey Dresser, MD  Multiple Vitamin (MULTIVITAMIN WITH MINERALS) TABS tablet Take 2 tablets by mouth daily. Alive gummy bears   Yes [provider]  OXYGEN Inhale 2 L into the lungs at bedtime. As needed   Yes [provider]  pantoprazole (PROTONIX) 40 MG tablet Take 40 mg by mouth 2 (two) times daily.   Yes [provider]  potassium chloride SA (KLOR-CON) 20 MEQ tablet Take 2 tablets (40 mEq total) by mouth 2 (two) times  daily. 09/08/20 10/19/20 Yes Alexandria Lodge, MD  torsemide (DEMADEX) 20 MG tablet Take 2 tablets (40 mg total) by mouth 2 (two) times daily. Patient taking differently: Take 20 mg by mouth 2 (two) times daily. 09/08/20 10/19/20 Yes [place] Lodge, MD  acetaminophen (TYLENOL) 325 MG tablet Take 650 mg by mouth every 6 (six) hours as needed for mild pain or headache.    [provider]  blood glucose meter kit and supplies Dispense based on patient and insurance preference. Use up to four times daily as directed. (FOR ICD-10 E10.9, E11.9). 01/02/20   Ghimire, Henreitta Leber, MD  Blood Glucose Monitoring Suppl (FIFTY50 GLUCOSE METER 2.0) w/Device KIT See admin instructions. 09/15/20   [provider]  clopidogrel (PLAVIX) 75 MG tablet Take 1 tablet (75 mg total) by mouth daily. 12/25/20 01/24/21  Larey Dresser, MD  empagliflozin (JARDIANCE) 10 MG TABS tablet Take 1 tablet (10 mg total) by mouth daily before breakfast. Patient not taking: No sig reported 11/23/20   Larey Dresser, MD  Menthol-Methyl Salicylate (MUSCLE RUB) 10-15 % CREA Apply 1 application topically as needed for muscle pain (knees).     [provider]  polyethylene glycol powder (GLYCOLAX/MIRALAX) 17 GM/SCOOP powder Take 1 Container by mouth daily as needed.    [provider]    Past Medical History: Past Medical History:  Diagnosis Date  .  Acute hypoxemic respiratory failure due to COVID-19 ([facility]) 12/30/2019  . Anemia   . Arthritis   . Atrial fibrillation ([place])   . Chronic kidney disease   . Chronic respiratory failure with hypoxia, on home O2 therapy ([place])   . CKD (chronic kidney disease), stage III ([facility])   . COVID-19   . Diabetes mellitus without complication ([facility])   . Diastolic CHF ([facility])   . GI bleed   . GI bleed   . Gout   . Hypertension   . Thrombocytopenia ([place])     Past Surgical History: Past Surgical History:  Procedure Laterality Date  . HERNIA REPAIR  02/2019  . IR THORACENTESIS  ASP PLEURAL SPACE W/IMG GUIDE  06/28/2020  . IR THORACENTESIS ASP PLEURAL SPACE W/IMG GUIDE  08/31/2020  . RIGHT HEART CATH N/A 09/07/2020   Procedure: RIGHT HEART CATH;  Surgeon: Larey Dresser, MD;  Location: [facility];  Service: Cardiovascular;  Laterality: N/A;    Family History:  Family History  Problem Relation Age of Onset  . Cancer Mother     Social History: Social History   Socioeconomic History  . Marital status: Married    Spouse name: Not on file  . Number of children: Not on file  . Years of education: Not on file  . Highest education level: Not on file  Occupational History  . Not on file  Tobacco Use  . Smoking status: Never Smoker  . Smokeless tobacco: Never Used  Vaping Use  . Vaping Use: Never used  Substance and Sexual Activity  . Alcohol use: Never  . Drug use: Never  . Sexual activity: Not on file  Other Topics Concern  . Not on file  Social History Narrative  . Not on file   Social Determinants of Health   Financial Resource Strain: Not on file  Food Insecurity: Not on file  Transportation Needs: No Transportation Needs  . Lack of Transportation (Medical): No  . Lack of Transportation (Non-Medical): No  Physical Activity: Not on file  Stress: Not on file  Social Connections: Socially Integrated  . Frequency of Communication with Friends and Family: More than three times a week  . Frequency of Social Gatherings with Friends and Family: Once a week  . Attends Religious Services: 1 to 4 times per year  . Active Member of Clubs or Organizations: No  . Attends Archivist Meetings: 1 to 4 times per year  . Marital Status: Married    Allergies:  Allergies  Allergen Reactions  . Codeine Other (See Comments)    Mouth sores   . Prednisone Other (See Comments)    Delusions   . Moxifloxacin Rash    Objective:    Vital Signs:   Temp:  [97.8 F (36.6 C)] 97.8 F (36.6 C) (01/18 0957) Pulse Rate:  [65] 65 (01/18  0957) Resp:  [17] 17 (01/18 0957) BP: (140)/(61) 140/61 (01/18 0957) SpO2:  [100 %] 100 % (01/18 0957) Weight:  [99.3 kg] 99.3 kg (01/18 0957)   Filed Weights   12/26/20 0957  Weight: 99.3 kg     Physical Exam     General:  Obese, NAD HEENT: Normal Neck: Supple. no JVD. Carotids 2+ bilat; no bruits. No lymphadenopathy or thyromegaly appreciated. Cor: PMI nondisplaced. Regular rate & rhythm. No rubs, gallops or murmurs. Lungs: Clear Abdomen: Soft, nontender, nondistended. No hepatosplenomegaly. No bruits or masses. Good bowel sounds. Extremities: No cyanosis, clubbing, rash, edema Neuro: Alert & oriented  x 3, cranial nerves grossly intact. moves all 4 extremities w/o difficulty. Affect pleasant.    Labs     Basic Metabolic Panel: No results for input(s): NA, K, CL, CO2, GLUCOSE, BUN, CREATININE, CALCIUM, MG, PHOS in the last 168 hours.  Liver Function Tests: No results for input(s): AST, ALT, ALKPHOS, BILITOT, PROT, ALBUMIN in the last 168 hours. No results for input(s): LIPASE, AMYLASE in the last 168 hours. No results for input(s): AMMONIA in the last 168 hours.  CBC: No results for input(s): WBC, NEUTROABS, HGB, HCT, MCV, PLT in the last 168 hours.  Cardiac Enzymes: No results for input(s): CKTOTAL, CKMB, CKMBINDEX, TROPONINI in the last 168 hours.  BNP: BNP (last 3 results) Recent Labs    06/27/20 0141 07/19/20 1752 08/28/20 0455  BNP 723.7* 620.4* 673.5*    ProBNP (last 3 results) No results for input(s): PROBNP in the last 8760 hours.   CBG: Recent Labs  Lab 12/26/20 1000  GLUCAP 156*    Coagulation Studies: No results for input(s): LABPROT, INR in the last 72 hours.  Imaging:  No results found.    Assessment/Plan   65 yo with history of diastolic CHF is here for RHC and Cardiomems.  No significant changes since last appointment.    Loralie Champagne, MD 12/26/2020, 11:52 AM  Advanced Heart Failure Team Pager [phone] (M-F; 7a - 4p)   Please contact [facility] for night-coverage after hours (4p -7a ) and weekends on [email or web address]

## 2020-12-27 MED FILL — Heparin Sod (Porcine)-NaCl IV Soln 1000 Unit/500ML-0.9%: INTRAVENOUS | Qty: 1000 | Status: AC

## 2020-12-28 ENCOUNTER — Encounter (HOSPITAL_COMMUNITY): Payer: PPO

## 2020-12-29 ENCOUNTER — Telehealth (HOSPITAL_COMMUNITY): Payer: Self-pay | Admitting: Surgery

## 2020-12-29 ENCOUNTER — Telehealth (HOSPITAL_COMMUNITY): Payer: Self-pay

## 2020-12-29 NOTE — Telephone Encounter (Signed)
lmtrc

## 2020-12-29 NOTE — Telephone Encounter (Signed)
-----   Message from Larey Dresser, MD sent at 12/26/2020  2:07 PM EST ----- 1. I want her to increase torsemide to 40 mg qam/20 mg qpm.   2. She needs to start Plavix 75 mg daily and take it for 1 month only then stop.  3. Continue aspirin 81 mg daily.  4. She will need a BMET in 1 week.   I spoke with patient and medications had already been entered as patient had Cardiomems implanted and medication changes were placed at that time.  I faxed orders for BMET to her PCP office as she wished to have the labs completed at Dr. Willette Pa office due to location and convenience.

## 2020-12-29 NOTE — Telephone Encounter (Signed)
-----   Message from Larey Dresser, MD sent at 12/26/2020  2:07 PM EST ----- 1. I want her to increase torsemide to 40 mg qam/20 mg qpm.   2. She needs to start Plavix 75 mg daily and take it for 1 month only then stop.  3. Continue aspirin 81 mg daily.  4. She will need a BMET in 1 week.

## 2021-01-01 ENCOUNTER — Other Ambulatory Visit (HOSPITAL_COMMUNITY): Payer: Self-pay | Admitting: Adult Health

## 2021-01-01 ENCOUNTER — Other Ambulatory Visit: Payer: Self-pay

## 2021-01-01 ENCOUNTER — Ambulatory Visit (HOSPITAL_COMMUNITY)
Admission: RE | Admit: 2021-01-01 | Discharge: 2021-01-01 | Disposition: A | Discharge status: Home / Self Care | Payer: PPO | Source: Ambulatory Visit | Attending: Internal Medicine | Admitting: Internal Medicine

## 2021-01-01 ENCOUNTER — Telehealth (HOSPITAL_COMMUNITY): Payer: Self-pay | Admitting: Adult Health

## 2021-01-01 DIAGNOSIS — I4819 Other persistent atrial fibrillation: Secondary | ICD-10-CM

## 2021-01-01 LAB — BASIC METABOLIC PANEL
Anion gap: 12 (ref 5–15)
BUN: 40 mg/dL — ABNORMAL HIGH (ref 8–23)
CO2: 25 mmol/L (ref 22–32)
Calcium: 9.3 mg/dL (ref 8.9–10.3)
Chloride: 102 mmol/L (ref 98–111)
Creatinine, Ser: 2.08 mg/dL — ABNORMAL HIGH (ref 0.44–1.00)
GFR, Estimated: 26 mL/min — ABNORMAL LOW (ref >60)
Glucose, Bld: 152 mg/dL — ABNORMAL HIGH (ref 70–99)
Potassium: 3.7 mmol/L (ref 3.5–5.1)
Sodium: 139 mmol/L (ref 135–145)

## 2021-01-01 NOTE — Telephone Encounter (Signed)
Called to complete cardiomems reading. Encouraged to use Cardiomems.   She would like to obtain lab work in the HF clinic. Will set up BMET today in the HF clinic.   Kimberlye Dilger NP- C 12:00 PM

## 2021-01-02 ENCOUNTER — Telehealth (HOSPITAL_COMMUNITY): Payer: Self-pay | Admitting: Adult Health

## 2021-01-02 NOTE — Telephone Encounter (Signed)
   Called to let her know that Cardiomems readings was successful.   Discussed need for daily readings.   She verbalized understanding and appreciated the call.   Julissa Browning NP-C  11:35 AM

## 2021-01-11 ENCOUNTER — Telehealth (HOSPITAL_COMMUNITY): Payer: Self-pay | Admitting: Adult Health

## 2021-01-11 NOTE — Telephone Encounter (Signed)
  Called to let her know we are getting cardiomems readings.   Goal Set for 21   No change. She verbalized understanding and appreciated the call.   Benjaman Artman Np-C  3:22 PM

## 2021-01-12 DIAGNOSIS — I509 Heart failure, unspecified: Secondary | ICD-10-CM | POA: Diagnosis not present

## 2021-01-23 ENCOUNTER — Telehealth (HOSPITAL_COMMUNITY): Payer: Self-pay | Admitting: Adult Health

## 2021-01-23 NOTE — Telephone Encounter (Signed)
   Called and asked to send cardiomems reading.   Lyndell Gillyard  NP-C  11:42 AM

## 2021-01-29 ENCOUNTER — Telehealth (HOSPITAL_COMMUNITY): Payer: Self-pay | Admitting: Pharmacist

## 2021-01-29 MED ORDER — TORSEMIDE 20 MG PO TABS: ORAL_TABLET | ORAL | 6 refills | Status: DC

## 2021-01-29 NOTE — Telephone Encounter (Signed)
Torsemide refill sent to [facility] per patient request.   Audry Riles, PharmD, BCPS, BCCP, CPP Heart Failure Clinic Pharmacist [phone]

## 2021-01-30 ENCOUNTER — Other Ambulatory Visit (HOSPITAL_COMMUNITY): Payer: Self-pay

## 2021-01-30 MED ORDER — CLOPIDOGREL BISULFATE 75 MG PO TABS: 75.0000 mg | ORAL_TABLET | Freq: Every day | ORAL | 0 refills | Status: DC

## 2021-01-31 ENCOUNTER — Telehealth (HOSPITAL_COMMUNITY): Payer: Self-pay | Admitting: Adult Health

## 2021-01-31 NOTE — Telephone Encounter (Signed)
  Cardiomems Reading  29   Suspect in the setting of high sodium diet. Discussed low salt food choices and limiting fluid intake to < 2 liters per day.   I have asked her to take an extra 20 mg torsemide for the next 2 days.   She verbalized understanding and appreciated phone call.   Nehan Flaum NP-C  2:24 PM

## 2021-02-01 ENCOUNTER — Other Ambulatory Visit (HOSPITAL_COMMUNITY): Payer: Self-pay | Admitting: Adult Health

## 2021-02-01 ENCOUNTER — Other Ambulatory Visit (HOSPITAL_COMMUNITY): Payer: Self-pay

## 2021-02-01 MED ORDER — CLOPIDOGREL BISULFATE 75 MG PO TABS: 75.0000 mg | ORAL_TABLET | Freq: Every day | ORAL | 0 refills | Status: DC

## 2021-02-02 ENCOUNTER — Other Ambulatory Visit (HOSPITAL_COMMUNITY): Payer: Self-pay

## 2021-02-02 MED ORDER — ISOSORBIDE MONONITRATE ER 60 MG PO TB24: 60.0000 mg | ORAL_TABLET | Freq: Every day | ORAL | 0 refills | Status: DC

## 2021-02-09 DIAGNOSIS — I509 Heart failure, unspecified: Secondary | ICD-10-CM | POA: Diagnosis not present

## 2021-02-12 ENCOUNTER — Telehealth (HOSPITAL_COMMUNITY): Payer: Self-pay | Admitting: Adult Health

## 2021-02-12 NOTE — Telephone Encounter (Signed)
Cardiomems reading elevated.   Says she feels ok today. Complaining of constipation. Missed a few doses of miralax.   Hold off on increasing miralax for now.   Check reading later this week. If still high will need to increase torsemide.   [name] verbalized understanding and appreciated the call.   Amy Clegg NP-C  3:28 PM

## 2021-02-16 ENCOUNTER — Telehealth (HOSPITAL_COMMUNITY): Payer: Self-pay | Admitting: Adult Health

## 2021-02-16 MED ORDER — TORSEMIDE 20 MG PO TABS: 40.0000 mg | ORAL_TABLET | Freq: Two times a day (BID) | ORAL | 6 refills | Status: DC

## 2021-02-16 NOTE — Telephone Encounter (Signed)
Pt aware repeat labs 02/21/21

## 2021-02-16 NOTE — Telephone Encounter (Signed)
°  Cardiomems Reading 29 Goal 21   I called her and asked her to increase torsemide 40 mg twice a day .   [name] verbalized understanding and appreciated the call.   She will need BMET set up. Will ask staff to call her for appointment.   Venise Ellingwood NP-C  3:37 PM

## 2021-02-19 ENCOUNTER — Telehealth (HOSPITAL_COMMUNITY): Payer: Self-pay | Admitting: Adult Health

## 2021-02-19 NOTE — Telephone Encounter (Signed)
  Cardiomems.  PAD elevated 31  Goal 21    Over the weekend she had pizza and sausage. Discussed low salt food choices. She denies shortness of breath.   [name] verbalized understanding. Continue current dose to torsemide.   Check BMET 3/16.   Josua Ferrebee Np-C  3:24 PM

## 2021-02-20 DIAGNOSIS — M1712 Unilateral primary osteoarthritis, left knee: Secondary | ICD-10-CM | POA: Diagnosis not present

## 2021-02-21 ENCOUNTER — Other Ambulatory Visit: Payer: Self-pay

## 2021-02-21 ENCOUNTER — Other Ambulatory Visit (HOSPITAL_COMMUNITY): Payer: Self-pay | Admitting: *Deleted

## 2021-02-21 ENCOUNTER — Other Ambulatory Visit (HOSPITAL_COMMUNITY): Payer: Self-pay | Admitting: Cardiology

## 2021-02-21 ENCOUNTER — Ambulatory Visit (HOSPITAL_COMMUNITY)
Admission: RE | Admit: 2021-02-21 | Discharge: 2021-02-21 | Disposition: A | Discharge status: Home / Self Care | Payer: PPO | Source: Ambulatory Visit | Attending: Internal Medicine | Admitting: Internal Medicine

## 2021-02-21 DIAGNOSIS — I5022 Chronic systolic (congestive) heart failure: Secondary | ICD-10-CM

## 2021-02-21 LAB — BASIC METABOLIC PANEL
Anion gap: 12 (ref 5–15)
BUN: 48 mg/dL — ABNORMAL HIGH (ref 8–23)
CO2: 27 mmol/L (ref 22–32)
Calcium: 9.4 mg/dL (ref 8.9–10.3)
Chloride: 97 mmol/L — ABNORMAL LOW (ref 98–111)
Creatinine, Ser: 2.34 mg/dL — ABNORMAL HIGH (ref 0.44–1.00)
GFR, Estimated: 22 mL/min — ABNORMAL LOW (ref >60)
Glucose, Bld: 206 mg/dL — ABNORMAL HIGH (ref 70–99)
Potassium: 4.3 mmol/L (ref 3.5–5.1)
Sodium: 136 mmol/L (ref 135–145)

## 2021-02-26 ENCOUNTER — Telehealth (HOSPITAL_COMMUNITY): Payer: Self-pay | Admitting: Pharmacist

## 2021-02-26 NOTE — Telephone Encounter (Signed)
Patient called confused on whether she needed to take Plavix or not. Previous notes from Dr. Aundra Dubin indicate that she should only take Plavix for 1 month, then stop. She has taken Plavix for approximately 2 months now. Instructed patient that she can stop Plavix and discontinued medication from her medication list. Patient expressed understanding.   Audry Riles, PharmD, BCPS, BCCP, CPP Heart Failure Clinic Pharmacist [phone]

## 2021-02-28 DIAGNOSIS — D500 Iron deficiency anemia secondary to blood loss (chronic): Secondary | ICD-10-CM | POA: Diagnosis not present

## 2021-03-01 DIAGNOSIS — D638 Anemia in other chronic diseases classified elsewhere: Secondary | ICD-10-CM | POA: Diagnosis not present

## 2021-03-01 DIAGNOSIS — K2901 Acute gastritis with bleeding: Secondary | ICD-10-CM | POA: Diagnosis not present

## 2021-03-01 DIAGNOSIS — R195 Other fecal abnormalities: Secondary | ICD-10-CM | POA: Diagnosis not present

## 2021-03-01 DIAGNOSIS — K5521 Angiodysplasia of colon with hemorrhage: Secondary | ICD-10-CM | POA: Diagnosis not present

## 2021-03-01 DIAGNOSIS — D500 Iron deficiency anemia secondary to blood loss (chronic): Secondary | ICD-10-CM | POA: Diagnosis not present

## 2021-03-07 DIAGNOSIS — M1711 Unilateral primary osteoarthritis, right knee: Secondary | ICD-10-CM | POA: Diagnosis not present

## 2021-03-12 ENCOUNTER — Other Ambulatory Visit: Payer: Self-pay | Admitting: Oncology

## 2021-03-12 ENCOUNTER — Encounter: Payer: Self-pay | Admitting: Oncology

## 2021-03-12 DIAGNOSIS — D649 Anemia, unspecified: Secondary | ICD-10-CM

## 2021-03-12 DIAGNOSIS — D500 Iron deficiency anemia secondary to blood loss (chronic): Secondary | ICD-10-CM | POA: Diagnosis not present

## 2021-03-12 DIAGNOSIS — N184 Chronic kidney disease, stage 4 (severe): Secondary | ICD-10-CM | POA: Diagnosis not present

## 2021-03-12 NOTE — Progress Notes (Deleted)
[facility]  [address] [place],  [name]  [address] [phone]  Clinic Day:  03/12/2021  Referring physician: [name], MD   HISTORY OF PRESENT ILLNESS:  The patient is a 65 y.o. female with anemia.  Labs in the past have shown evidence of renal insufficiency.  He also has had GI bleeds to where the patient claims she has been transfused over 20 units of blood during this calendar year.  A capsule endoscopy did show evidence of a small GI bleed.  At that time, the patient was on anticoagulation due to developing a pulmonary embolus in her past.  However, due to her severe anemia and recurrent GI bleeds, it was recommended that her anticoagulation be stopped.  Since her anticoagulation was discontinued a few months ago, she has not had any further GI bleeds or other forms of blood loss.  She claims to be feeling the best she has in months.    PHYSICAL EXAM:  There were no vitals taken for this visit. Wt Readings from Last 3 Encounters:  12/26/20 219 lb (99.3 kg)  11/27/20 215 lb 9.6 oz (97.8 kg)  11/23/20 218 lb (98.9 kg)   There is no height or weight on file to calculate BMI. Performance status (ECOG): 1 Physical Exam Constitutional:      Appearance: Normal appearance. She is not ill-appearing.  HENT:     Mouth/Throat:     Mouth: Mucous membranes are moist.     Pharynx: Oropharynx is clear. No oropharyngeal exudate or posterior oropharyngeal erythema.  Cardiovascular:     Rate and Rhythm: Normal rate and regular rhythm.     Heart sounds: No murmur heard. No friction rub. No gallop.   Pulmonary:     Effort: Pulmonary effort is normal. No respiratory distress.     Breath sounds: Normal breath sounds. No wheezing, rhonchi or rales.  Chest:  Breasts:     Right: No axillary adenopathy or supraclavicular adenopathy.     Left: No axillary adenopathy or supraclavicular adenopathy.    Abdominal:     General: Bowel sounds are normal.  There is no distension.     Palpations: Abdomen is soft. There is no mass.     Tenderness: There is no abdominal tenderness.  Musculoskeletal:        General: No swelling.     Right lower leg: No edema.     Left lower leg: No edema.  Lymphadenopathy:     Cervical: No cervical adenopathy.     Upper Body:     Right upper body: No supraclavicular or axillary adenopathy.     Left upper body: No supraclavicular or axillary adenopathy.     Lower Body: No right inguinal adenopathy. No left inguinal adenopathy.  Skin:    General: Skin is warm.     Coloration: Skin is not jaundiced.     Findings: No lesion or rash.  Neurological:     General: No focal deficit present.     Mental Status: She is alert and oriented to person, place, and time. Mental status is at baseline.     Cranial Nerves: Cranial nerves are intact.  Psychiatric:        Mood and Affect: Mood normal.        Behavior: Behavior normal.        Thought Content: Thought content normal.     LABS:   CBC Latest Ref Rng & Units 12/26/2020 12/26/2020 11/27/2020  WBC - - -  4.7  Hemoglobin 12.0 - 15.0 g/dL 9.9(L) 9.9(L) 10.3(A)  Hematocrit 36.0 - 46.0 % 29.0(L) 29.0(L) 32(A)  Platelets 150 - 399 - - 166   CMP Latest Ref Rng & Units 02/21/2021 01/01/2021 12/26/2020  Glucose 70 - 99 mg/dL 206(H) 152(H) 158(H)  BUN 8 - 23 mg/dL 48(H) 40(H) 37(H)  Creatinine 0.44 - 1.00 mg/dL 2.34(H) 2.08(H) 2.04(H)  Sodium 135 - 145 mmol/L 136 139 140  Potassium 3.5 - 5.1 mmol/L 4.3 3.7 3.9  Chloride 98 - 111 mmol/L 97(L) 102 102  CO2 22 - 32 mmol/L '27 25 28  '$ Calcium 8.9 - 10.3 mg/dL 9.4 9.3 9.0  Total Protein 6.5 - 8.1 g/dL - - -  Total Bilirubin 0.3 - 1.2 mg/dL - - -  Alkaline Phos 25 - 125 - - -  AST 13 - 35 - - -  ALT 7 - 35 - - -       ASSESSMENT & PLAN:  Assessment/Plan:  A 65 y.o. female with anemia.  When evaluating her recent labs, I am pleased as her hemoglobin is above 10.  Based upon her labs today, she does not have any  nutritional deficiencies factoring into her anemia.  She also has not had any recent blood loss.  The question now is how much of her anemia can be attributed to her renal insufficiency.  Ultimately, she may need to be evaluated by a nephrologist to determine the best ways to preserve the renal function she has left.  Clinically, she appears to be doing much better.  I will see her back in 4 months for repeat clinical assessment.  The patient understands all the plans discussed today and is in agreement with them.     Tadarrius Burch Macarthur Critchley, MD

## 2021-03-13 ENCOUNTER — Inpatient Hospital Stay: Payer: PPO | Admitting: Oncology

## 2021-03-13 ENCOUNTER — Inpatient Hospital Stay: Payer: PPO | Attending: Oncology

## 2021-03-19 DIAGNOSIS — E876 Hypokalemia: Secondary | ICD-10-CM | POA: Diagnosis not present

## 2021-03-19 DIAGNOSIS — Z8719 Personal history of other diseases of the digestive system: Secondary | ICD-10-CM | POA: Diagnosis not present

## 2021-03-19 DIAGNOSIS — N184 Chronic kidney disease, stage 4 (severe): Secondary | ICD-10-CM | POA: Diagnosis not present

## 2021-03-19 DIAGNOSIS — I5022 Chronic systolic (congestive) heart failure: Secondary | ICD-10-CM | POA: Diagnosis not present

## 2021-03-19 DIAGNOSIS — D631 Anemia in chronic kidney disease: Secondary | ICD-10-CM | POA: Diagnosis not present

## 2021-03-19 DIAGNOSIS — E1122 Type 2 diabetes mellitus with diabetic chronic kidney disease: Secondary | ICD-10-CM | POA: Diagnosis not present

## 2021-03-19 DIAGNOSIS — N2581 Secondary hyperparathyroidism of renal origin: Secondary | ICD-10-CM | POA: Diagnosis not present

## 2021-03-19 DIAGNOSIS — I129 Hypertensive chronic kidney disease with stage 1 through stage 4 chronic kidney disease, or unspecified chronic kidney disease: Secondary | ICD-10-CM | POA: Diagnosis not present

## 2021-03-19 DIAGNOSIS — I4891 Unspecified atrial fibrillation: Secondary | ICD-10-CM | POA: Diagnosis not present

## 2021-03-20 DIAGNOSIS — H527 Unspecified disorder of refraction: Secondary | ICD-10-CM | POA: Diagnosis not present

## 2021-03-28 ENCOUNTER — Other Ambulatory Visit: Payer: PPO

## 2021-03-28 ENCOUNTER — Ambulatory Visit: Payer: PPO | Admitting: Oncology

## 2021-04-03 ENCOUNTER — Telehealth (HOSPITAL_COMMUNITY): Payer: Self-pay | Admitting: Adult Health

## 2021-04-03 NOTE — Telephone Encounter (Signed)
Cardiomems Goal 21  Reading 25 today.   I called and she says she has been eating salty foods. Asked her to take an extra 20 mg for 2 days.   Faisal Stradling  NP-C  12:16 PM

## 2021-04-05 ENCOUNTER — Telehealth (HOSPITAL_COMMUNITY): Payer: Self-pay | Admitting: Adult Health

## 2021-04-05 NOTE — Telephone Encounter (Signed)
   Cardiomems Goal 21   Reading 32 today.   Torsemide increased to 40 mg twice a day earlier this week.   Had some chips last night.  Will need to increase torsemide to 60 mg twice a day.    Discussed low salt food choices. [name] verbalized  Understanding.   Halston Kintz NP-C  3:26 PM

## 2021-04-18 ENCOUNTER — Telehealth (HOSPITAL_COMMUNITY): Payer: Self-pay | Admitting: Adult Health

## 2021-04-18 DIAGNOSIS — I5022 Chronic systolic (congestive) heart failure: Secondary | ICD-10-CM

## 2021-04-18 NOTE — Telephone Encounter (Signed)
Cardiomems Reading 28 Goal 21  Please call instuct to increase torsemide to 60 mg twice a day.   Needs BMET next 1-2 weeks and check [place] Clip    [name] Np_C  4:44 PM

## 2021-04-25 MED ORDER — TORSEMIDE 20 MG PO TABS: 60.0000 mg | ORAL_TABLET | Freq: Two times a day (BID) | ORAL | 6 refills | Status: DC

## 2021-04-25 NOTE — Telephone Encounter (Signed)
Pt aware and voiced understanding  Repeat labs 5/25

## 2021-04-25 NOTE — Addendum Note (Signed)
Addended by: Kerry Dory on: 04/25/2021 02:21 PM   Modules accepted: Orders

## 2021-05-02 ENCOUNTER — Ambulatory Visit (HOSPITAL_COMMUNITY)
Admission: RE | Admit: 2021-05-02 | Discharge: 2021-05-02 | Disposition: A | Discharge status: Home / Self Care | Payer: PPO | Source: Ambulatory Visit | Attending: Internal Medicine | Admitting: Internal Medicine

## 2021-05-02 ENCOUNTER — Other Ambulatory Visit: Payer: Self-pay

## 2021-05-02 ENCOUNTER — Telehealth (HOSPITAL_COMMUNITY): Payer: Self-pay | Admitting: Adult Health

## 2021-05-02 DIAGNOSIS — I5022 Chronic systolic (congestive) heart failure: Secondary | ICD-10-CM | POA: Insufficient documentation

## 2021-05-02 LAB — BASIC METABOLIC PANEL
Anion gap: 8 (ref 5–15)
BUN: 41 mg/dL — ABNORMAL HIGH (ref 8–23)
CO2: 28 mmol/L (ref 22–32)
Calcium: 9.4 mg/dL (ref 8.9–10.3)
Chloride: 102 mmol/L (ref 98–111)
Creatinine, Ser: 2.48 mg/dL — ABNORMAL HIGH (ref 0.44–1.00)
GFR, Estimated: 21 mL/min — ABNORMAL LOW (ref >60)
Glucose, Bld: 134 mg/dL — ABNORMAL HIGH (ref 70–99)
Potassium: 4.1 mmol/L (ref 3.5–5.1)
Sodium: 138 mmol/L (ref 135–145)

## 2021-05-02 NOTE — Telephone Encounter (Signed)
Cardiomems Reading 28 today. Goal 21.   Called and she has only been taking torsemide 40 mg twice a day instead of 60 mg twice a day   Instructed to take torsemide 60 mg twice a day and we will set HF appointment  Darrick Grinder NP- C 2:49 PM   Please call with an appointment

## 2021-05-02 NOTE — Telephone Encounter (Signed)
Follow up appt scheduled for 6/29 @ 130 appt card mailed to patient [facility]

## 2021-05-09 DIAGNOSIS — K5900 Constipation, unspecified: Secondary | ICD-10-CM | POA: Diagnosis not present

## 2021-05-09 DIAGNOSIS — R109 Unspecified abdominal pain: Secondary | ICD-10-CM | POA: Diagnosis not present

## 2021-05-09 DIAGNOSIS — L989 Disorder of the skin and subcutaneous tissue, unspecified: Secondary | ICD-10-CM | POA: Diagnosis not present

## 2021-05-09 DIAGNOSIS — R110 Nausea: Secondary | ICD-10-CM | POA: Diagnosis not present

## 2021-05-09 DIAGNOSIS — R1084 Generalized abdominal pain: Secondary | ICD-10-CM | POA: Diagnosis not present

## 2021-05-25 DIAGNOSIS — M1712 Unilateral primary osteoarthritis, left knee: Secondary | ICD-10-CM | POA: Diagnosis not present

## 2021-06-05 NOTE — Progress Notes (Signed)
ADVANCED HEART FAILURE CLINIC NOTE PCP: Dr Bea Graff Nephrology: Dr. Royce Macadamia HF Cardiology: Dr Aundra Dubin   HPI: [name] is a 65 y.o. 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    She has had multiple hospitalizations in the last year.  She was admitted January 2021 for COVID-19 pneumonia w/ acute hypoxic respiratory failure.  She did not require intubation.  She was treated with steroids and remdesivir.  She was readmitted twice in 7/21 for CHF exacerbations complicated by pleural effusion requiring IR guided thoracentesis.  She was readmitted again 8/21 for recurrent CHF and diuresed with IV Lasix.  Echo 8/21 showed moderately reduced systolic function, EF 35 to 40% with mild concentric left ventricular hypertrophy and grade 2 diastolic dysfunction, mild hypokinesis of the LV apical segment, RV systolic function was normal.    Readmitted recurrent CHF exacerbation plus AKI on CKD in 9/21.  Previous creatinine last admit was 1.76.  Creatinine on this admission elevated at 2.06. Chest x-ray demonstrated cardiomegaly and pulmonary venous congestion with recurrent right-sided pleural effusion.  BNP 673.  CBC showed chronic anemia, hemoglobin 8.9.  Also with worsening thrombocytopenia.  Platelets down to 84K. Echo repeated with EF 40-45%, RV ok, PASP 51 mmHg. Placed on milrinone to support RV for diuresis. Discharge weight 225 pounds. She was discharged on bidil but too expensive. Changed to Imdur + hydral. Cardiac MRI was done this admission, showed LV EF 41%, RV EF 27%,  D-shaped septum, RV insertion site LGE (RV>LV failure), no evidence for cardiac amyloidosis.   She has seen EP for Watchman evaluation given inability to take anticoagulation.  Dr. Quentin Ore thought that she is not a  candidate for left atrial appendage occlusion given the risks associated with general anesthesia.  She returns for followup of CHF 12/21.  Stable NYHA II symptoms, GDMT limited by rising renal function. She is still on supplemental oxygen. Home sleep study ordered and arranged for Cardiomems.   Today she returns for HF follow up with her husband. She now has Cardiomems. Overall feeling fine, uses walker. Denies increasing SOB, CP, dizziness, edema, or PND/Orthopnea. Appetite ok. No fever or chills. Weight at home 223 pounds. Taking all medications. She is drinking more fluids lately, >2L/day. Says she is trying not to eat salty foods.  Labs (11/21): K 4.5, creatinine 2.11 Labs (5/22): K 4.1, creatinine 2.48  ECG (personally reviewed): atrial fibrillation, IVCD 120 msec.  Cardiomems (PAD goal 21): 28 today.  PMH: 1. Chronic systolic CHF:  - Echo 9/93 EF 35-40%, mod HK, G2DD, RV normal - Echo 9/21 EF 40-45%, global hypokinesis, D-shaped septum, at least mildly decreased RV systolic function.  - Cardiac MRI (9/21): LV EF 41%, diffuse hypokinesis, D-shaped septum, RV mildly dilated with RV EF 27%, nonspecific RV insertion site LGE.  No evidence for cardiac amyloidosis.  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  - RHC (10/21): mean RA 9, PA 54/17 mean 33, mean PCWP 20, CI 3.33, PVR 1.9 WU - Cardiomems placed 12/2020. 2. CKD stage IV 3. Type 2 diabetes 4. Pleural effusion s/p thoracentesis (due to CHF) 5. HTN 6. 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  7. COVID-19  infection 8. Atrial fibrillation: Chronic.  Not anticoagulated due to chronic anemia and GI bleeding.  - Not Watchman candidate per EP.  9. Anemia: Suspect due to chronic low grade GI bleeding (small bowel AVMs).  10. Suspect OSA/OHS: She is on home oxygen.   11. Left breast cancer: Treated remotely with chemo/radiation.  12. GERD 13. Chronic mild thrombocytopenia  ROS: All systems negative except as listed in HPI, PMH and Problem List.  Social History   Socioeconomic History   Marital status: Married    Spouse name: Not on file   Number of children: Not on file   Years of education: Not on file   Highest education level: Not on file  Occupational History   Not on file  Tobacco Use   Smoking status: Never   Smokeless tobacco: Never  Vaping Use   Vaping Use: Never used  Substance and Sexual Activity   Alcohol use: Never   Drug use: Never   Sexual activity: Not on file  Other Topics Concern   Not on file  Social History Narrative   Not on file   Social Determinants of Health   Financial Resource Strain: Not on file  Food Insecurity: Not on file  Transportation Needs: No Transportation Needs   Lack of Transportation (Medical): No   Lack of Transportation (Non-Medical): No  Physical Activity: Not on file  Stress: Not on file  Social Connections: Socially Integrated   Frequency of Communication with Friends and Family: More than three times a week   Frequency of Social Gatherings with Friends and Family: Once a week   Attends Religious Services: 1 to 4 times per year   Active Member of [employer] or Organizations: No   Attends Music therapist: 1 to 4 times per year   Marital Status: Married  Human resources officer Violence: Not on file   Family History  Problem Relation Age of Onset   Cancer Mother    Current Outpatient Medications  Medication Sig Dispense Refill   acetaminophen (TYLENOL) 325 MG tablet Take 650 mg by mouth every 6 (six) hours as needed for mild pain or headache.     allopurinol (ZYLOPRIM) 100 MG tablet Take 100 mg by mouth 2 (two) times daily.     aspirin EC 81 MG tablet Take 81 mg by mouth daily. Swallow whole.     blood glucose meter kit and supplies Dispense based on patient and insurance  preference. Use up to four times daily as directed. (FOR ICD-10 E10.9, E11.9). 1 each 0   Blood Glucose Monitoring Suppl (FIFTY50 GLUCOSE METER 2.0) w/Device KIT See admin instructions.     empagliflozin (JARDIANCE) 10 MG TABS tablet Take 1 tablet (10 mg total) by mouth daily before breakfast. 30 tablet 6   ferrous sulfate 325 (65 FE) MG tablet Take 325 mg by mouth 2 (two) times daily with a meal.      glipiZIDE (GLUCOTROL XL) 5 MG 24 hr tablet Take 5 mg by mouth daily with breakfast.      hydrALAZINE (APRESOLINE) 25 MG tablet Take 1.5 tablets (37.5 mg total) by mouth 3 (three) times daily. (Patient taking differently: Take 25 mg by mouth 3 (three) times daily.) 135 tablet 3   isosorbide mononitrate (IMDUR) 60 MG 24 hr tablet Take 1 tablet (60 mg total) by mouth daily. Needs appointment for further refill 90 tablet 0   Menthol-Methyl Salicylate (MUSCLE RUB) 10-15 % CREA Apply 1 application topically as needed for muscle pain (knees).  Multiple Vitamin (MULTIVITAMIN WITH MINERALS) TABS tablet Take 2 tablets by mouth daily. Alive gummy bears     OXYGEN Inhale 2 L into the lungs at bedtime. As needed     pantoprazole (PROTONIX) 40 MG tablet Take 40 mg by mouth 2 (two) times daily.     polyethylene glycol powder (GLYCOLAX/MIRALAX) 17 GM/SCOOP powder Take 1 Container by mouth daily as needed.     torsemide (DEMADEX) 20 MG tablet Take 3 tablets (60 mg total) by mouth 2 (two) times daily. 180 tablet 6   carvedilol (COREG) 6.25 MG tablet Take 1 tablet (6.25 mg total) by mouth 2 (two) times daily with a meal. 60 tablet 6   potassium chloride SA (KLOR-CON) 20 MEQ tablet Take 2 tablets (40 mEq total) by mouth 2 (two) times daily. 120 tablet 0   No current facility-administered medications for this encounter.   BP (!) 130/57   Pulse 73   Wt 101.8 kg (224 lb 6.4 oz)   SpO2 97%   BMI 42.40 kg/m   Wt Readings from Last 3 Encounters:  06/06/21 101.8 kg (224 lb 6.4 oz)  12/26/20 99.3 kg (219 lb)   11/27/20 97.8 kg (215 lb 9.6 oz)   PHYSICAL EXAM: General:  NAD. No resp difficulty HEENT: Normal Neck: Supple. JVP 7-8. Carotids 2+ bilat; no bruits. No lymphadenopathy or thryomegaly appreciated. Cor: PMI nondisplaced. Irregular rate & rhythm. No rubs, gallops, I/VI SEM RUSB Lungs: Clear Abdomen: Obese, nontender, nondistended. No hepatosplenomegaly. No bruits or masses. Good bowel sounds. Extremities: No cyanosis, clubbing, rash, 1+ LE edema, R>L Neuro: Alert & oriented x 3, cranial nerves grossly intact. Moves all 4 extremities w/o difficulty. Affect pleasant.  ASSESSMENT & PLAN: 1.Chronic systolic CHF: Prominent signs of RV failure.  9/21 echo showed EF 40-45%, global hypokinesis, D-shaped IV septum suggestive of RV pressure/volume overload, RV poorly visualized but appeared normal in size with at least mild systolic dysfunction. Cause of cardiomyopathy is uncertain.  She has history of COVID-19 1/21, cannot rule out viral myocarditis. Chemotherapy-mediated CMP is possible (not sure what chemo she had remotely for breast cancer).  Cardiac MRI showed LV EF 41% with diffuse hypokinesis, RV mildly dilated but with EF 27% and D-shaped septum, nonspecific RV insertion site LGE.  Suspect RV > LV failure. Cardiac MRI is not suggestive of amyloidosis, no M-spike on myeloma panel. We have not ruled out CAD (cath deferred given lack of ischemic CP and CKD). She has had recurrent HF admissions this year.  Milrinone started primarily for RV support last admit 9/21 and titrated off. She had cardiomems placed 1/22. NYHA class II symptoms, functional capacity limited by body habitus and knee pain.  Volume up on exam, weight up 5 lbs, and by Cardiomems of 28 (goal 21). - Increase torsemide to 80 mg bid.  BMET today, repeat 10 days. - Continue Coreg 6.25 mg bid.  - Continue hydralazine 37.5 mg tid and Imdur to 60 mg daily. - Continue Jardiance 10 mg daily.  - No ARNI/spironolactone for now with elevated  creatinine.  2. Chronic hypoxemic respiratory failure: Suspect OHS/OSA.  She was using home oxygen prior to COVID-19 PNA.  - Continue supplemental oxygen at night. - Home sleep study ordered for daytime fatigue and snoring. She will bring her tablet with her to next appt and we will help her set up study. She does not have a smart phone. 3. Atrial fibrillation: Chronic, all ECGs since 7/21 show atrial fibrillation.  She is not on anticoagulation  due to GI bleeding from small bowel AVMs (recurrent). Rate is controlled.  - She cannot be cardioverted at this point as she cannot be chronically anticoagulated. - Evaluated by Dr. Quentin Ore for [place] but not a suitable candidate given multiple severe medical comorbidities and risks associated with general anesthesia. 4. GI bleeding: Recurrent, from small bowel AVMs.  - Unable to be anticoagulated.  - CBC today.  5. Right pleural effusion: Suspect due to CHF.  Has had thoracentesis.  6. H/o DVT/PE: Provoked (post-surgery). No longer on anticoagulation due to h/o significant GIB. Has IVC filter.  7. CKD: Stage IV. Baseline SCr ~2.2.  - Followed by Dr. Royce Macadamia, has appt in July.  Followup 4-6 weeks with APP.  [place], [place] 06/06/2021

## 2021-06-06 ENCOUNTER — Other Ambulatory Visit: Payer: Self-pay

## 2021-06-06 ENCOUNTER — Encounter (HOSPITAL_COMMUNITY): Payer: Self-pay

## 2021-06-06 ENCOUNTER — Ambulatory Visit (HOSPITAL_COMMUNITY)
Admission: RE | Admit: 2021-06-06 | Discharge: 2021-06-06 | Disposition: A | Discharge status: Home / Self Care | Payer: PPO | Source: Ambulatory Visit | Attending: Family Medicine | Admitting: Family Medicine

## 2021-06-06 VITALS — BP 130/57 | HR 73 | Wt 224.4 lb

## 2021-06-06 DIAGNOSIS — I4821 Permanent atrial fibrillation: Secondary | ICD-10-CM | POA: Diagnosis not present

## 2021-06-06 DIAGNOSIS — I5022 Chronic systolic (congestive) heart failure: Secondary | ICD-10-CM

## 2021-06-06 DIAGNOSIS — Z79899 Other long term (current) drug therapy: Secondary | ICD-10-CM | POA: Diagnosis not present

## 2021-06-06 DIAGNOSIS — Z9221 Personal history of antineoplastic chemotherapy: Secondary | ICD-10-CM | POA: Diagnosis not present

## 2021-06-06 DIAGNOSIS — Z7984 Long term (current) use of oral hypoglycemic drugs: Secondary | ICD-10-CM | POA: Diagnosis not present

## 2021-06-06 DIAGNOSIS — J9611 Chronic respiratory failure with hypoxia: Secondary | ICD-10-CM

## 2021-06-06 DIAGNOSIS — I482 Chronic atrial fibrillation, unspecified: Secondary | ICD-10-CM | POA: Insufficient documentation

## 2021-06-06 DIAGNOSIS — E1122 Type 2 diabetes mellitus with diabetic chronic kidney disease: Secondary | ICD-10-CM | POA: Insufficient documentation

## 2021-06-06 DIAGNOSIS — D631 Anemia in chronic kidney disease: Secondary | ICD-10-CM | POA: Insufficient documentation

## 2021-06-06 DIAGNOSIS — Z87898 Personal history of other specified conditions: Secondary | ICD-10-CM

## 2021-06-06 DIAGNOSIS — K922 Gastrointestinal hemorrhage, unspecified: Secondary | ICD-10-CM | POA: Insufficient documentation

## 2021-06-06 DIAGNOSIS — Z8709 Personal history of other diseases of the respiratory system: Secondary | ICD-10-CM | POA: Diagnosis not present

## 2021-06-06 DIAGNOSIS — Z7982 Long term (current) use of aspirin: Secondary | ICD-10-CM | POA: Diagnosis not present

## 2021-06-06 DIAGNOSIS — Z923 Personal history of irradiation: Secondary | ICD-10-CM | POA: Diagnosis not present

## 2021-06-06 DIAGNOSIS — I130 Hypertensive heart and chronic kidney disease with heart failure and stage 1 through stage 4 chronic kidney disease, or unspecified chronic kidney disease: Secondary | ICD-10-CM | POA: Diagnosis not present

## 2021-06-06 DIAGNOSIS — E669 Obesity, unspecified: Secondary | ICD-10-CM | POA: Diagnosis not present

## 2021-06-06 DIAGNOSIS — Z86718 Personal history of other venous thrombosis and embolism: Secondary | ICD-10-CM | POA: Diagnosis not present

## 2021-06-06 DIAGNOSIS — N184 Chronic kidney disease, stage 4 (severe): Secondary | ICD-10-CM | POA: Diagnosis not present

## 2021-06-06 DIAGNOSIS — Z8616 Personal history of COVID-19: Secondary | ICD-10-CM | POA: Insufficient documentation

## 2021-06-06 DIAGNOSIS — J90 Pleural effusion, not elsewhere classified: Secondary | ICD-10-CM | POA: Insufficient documentation

## 2021-06-06 DIAGNOSIS — Z86711 Personal history of pulmonary embolism: Secondary | ICD-10-CM | POA: Diagnosis not present

## 2021-06-06 DIAGNOSIS — Z853 Personal history of malignant neoplasm of breast: Secondary | ICD-10-CM | POA: Diagnosis not present

## 2021-06-06 LAB — CBC
HCT: 29.2 % — ABNORMAL LOW (ref 36.0–46.0)
Hemoglobin: 9.2 g/dL — ABNORMAL LOW (ref 12.0–15.0)
MCH: 27.4 pg (ref 26.0–34.0)
MCHC: 31.5 g/dL (ref 30.0–36.0)
MCV: 86.9 fL (ref 80.0–100.0)
Platelets: 94 10*3/uL — ABNORMAL LOW (ref 150–400)
RBC: 3.36 MIL/uL — ABNORMAL LOW (ref 3.87–5.11)
RDW: 16 % — ABNORMAL HIGH (ref 11.5–15.5)
WBC: 4.8 10*3/uL (ref 4.0–10.5)
nRBC: 0 % (ref 0.0–0.2)

## 2021-06-06 LAB — BASIC METABOLIC PANEL
Anion gap: 11 (ref 5–15)
BUN: 40 mg/dL — ABNORMAL HIGH (ref 8–23)
CO2: 32 mmol/L (ref 22–32)
Calcium: 9.4 mg/dL (ref 8.9–10.3)
Chloride: 94 mmol/L — ABNORMAL LOW (ref 98–111)
Creatinine, Ser: 2.06 mg/dL — ABNORMAL HIGH (ref 0.44–1.00)
GFR, Estimated: 26 mL/min — ABNORMAL LOW (ref >60)
Glucose, Bld: 242 mg/dL — ABNORMAL HIGH (ref 70–99)
Potassium: 3.4 mmol/L — ABNORMAL LOW (ref 3.5–5.1)
Sodium: 137 mmol/L (ref 135–145)

## 2021-06-06 MED ORDER — TORSEMIDE 20 MG PO TABS: 80.0000 mg | ORAL_TABLET | Freq: Two times a day (BID) | ORAL | 3 refills | Status: DC

## 2021-06-06 NOTE — Patient Instructions (Signed)
INCREASE Torsemide to 80 mg (4 tabs) twice a day  Labs today We will only contact you if something comes back abnormal or we need to make some changes. Otherwise no news is good news!  Labs needed in 7-10 days  Your physician recommends that you schedule a follow-up appointment in: 6-8 weeks  in the Advanced Practitioners (PA/NP) Clinic    Do the following things EVERYDAY: Weigh yourself in the morning before breakfast. Write it down and keep it in a log. Take your medicines as prescribed Eat low salt foods--Limit salt (sodium) to 2000 mg per day.  Stay as active as you can everyday Limit all fluids for the day to less than 2 liters  At the [facility], you and your health needs are our priority. As part of our continuing mission to provide you with exceptional heart care, we have created designated Provider Care Teams. These Care Teams include your primary Cardiologist (physician) and Advanced Practice Providers (APPs- Physician Assistants and Nurse Practitioners) who all work together to provide you with the care you need, when you need it.   You may see any of the following providers on your designated Care Team at your next follow up: Dr Glori Bickers Dr Loralie Champagne Dr Patrice Paradise, NP Lyda Jester, [place] Ginnie Smart Audry Riles, PharmD   Please be sure to bring in all your medications bottles to every appointment.   If you have any questions or concerns before your next appointment please send [place] a message through [facility] or call our office at [phone].    TO LEAVE A MESSAGE FOR THE NURSE SELECT OPTION 2, PLEASE LEAVE A MESSAGE INCLUDING: YOUR NAME DATE OF BIRTH CALL BACK NUMBER REASON FOR CALL**this is important as we prioritize the call backs  YOU WILL RECEIVE A CALL BACK THE SAME DAY AS LONG AS YOU CALL BEFORE 4:00 PM

## 2021-06-06 NOTE — Addendum Note (Signed)
Encounter addended by: Kerry Dory, CMA on: 06/06/2021 2:38 PM  Actions taken: Order list changed, Diagnosis association updated

## 2021-06-08 DIAGNOSIS — I872 Venous insufficiency (chronic) (peripheral): Secondary | ICD-10-CM | POA: Diagnosis not present

## 2021-06-08 DIAGNOSIS — I5042 Chronic combined systolic (congestive) and diastolic (congestive) heart failure: Secondary | ICD-10-CM | POA: Diagnosis not present

## 2021-06-12 ENCOUNTER — Other Ambulatory Visit (HOSPITAL_COMMUNITY): Payer: Self-pay

## 2021-06-12 DIAGNOSIS — E1122 Type 2 diabetes mellitus with diabetic chronic kidney disease: Secondary | ICD-10-CM | POA: Diagnosis not present

## 2021-06-12 DIAGNOSIS — D500 Iron deficiency anemia secondary to blood loss (chronic): Secondary | ICD-10-CM | POA: Diagnosis not present

## 2021-06-12 DIAGNOSIS — I5042 Chronic combined systolic (congestive) and diastolic (congestive) heart failure: Secondary | ICD-10-CM | POA: Diagnosis not present

## 2021-06-12 DIAGNOSIS — I130 Hypertensive heart and chronic kidney disease with heart failure and stage 1 through stage 4 chronic kidney disease, or unspecified chronic kidney disease: Secondary | ICD-10-CM | POA: Diagnosis not present

## 2021-06-12 DIAGNOSIS — I4819 Other persistent atrial fibrillation: Secondary | ICD-10-CM | POA: Diagnosis not present

## 2021-06-12 DIAGNOSIS — I248 Other forms of acute ischemic heart disease: Secondary | ICD-10-CM | POA: Diagnosis not present

## 2021-06-12 DIAGNOSIS — Z6841 Body Mass Index (BMI) 40.0 and over, adult: Secondary | ICD-10-CM | POA: Diagnosis not present

## 2021-06-12 DIAGNOSIS — I272 Pulmonary hypertension, unspecified: Secondary | ICD-10-CM | POA: Diagnosis not present

## 2021-06-12 DIAGNOSIS — N1832 Chronic kidney disease, stage 3b: Secondary | ICD-10-CM | POA: Diagnosis not present

## 2021-06-12 DIAGNOSIS — J9611 Chronic respiratory failure with hypoxia: Secondary | ICD-10-CM | POA: Diagnosis not present

## 2021-06-12 DIAGNOSIS — E782 Mixed hyperlipidemia: Secondary | ICD-10-CM | POA: Diagnosis not present

## 2021-06-12 DIAGNOSIS — Z0000 Encounter for general adult medical examination without abnormal findings: Secondary | ICD-10-CM | POA: Diagnosis not present

## 2021-06-15 DIAGNOSIS — M1A00X0 Idiopathic chronic gout, unspecified site, without tophus (tophi): Secondary | ICD-10-CM | POA: Diagnosis not present

## 2021-06-15 DIAGNOSIS — E1122 Type 2 diabetes mellitus with diabetic chronic kidney disease: Secondary | ICD-10-CM | POA: Diagnosis not present

## 2021-06-15 DIAGNOSIS — I10 Essential (primary) hypertension: Secondary | ICD-10-CM | POA: Diagnosis not present

## 2021-06-15 DIAGNOSIS — D500 Iron deficiency anemia secondary to blood loss (chronic): Secondary | ICD-10-CM | POA: Diagnosis not present

## 2021-06-15 DIAGNOSIS — N1832 Chronic kidney disease, stage 3b: Secondary | ICD-10-CM | POA: Diagnosis not present

## 2021-06-15 DIAGNOSIS — E782 Mixed hyperlipidemia: Secondary | ICD-10-CM | POA: Diagnosis not present

## 2021-06-19 DIAGNOSIS — N2581 Secondary hyperparathyroidism of renal origin: Secondary | ICD-10-CM | POA: Diagnosis not present

## 2021-06-19 DIAGNOSIS — I4891 Unspecified atrial fibrillation: Secondary | ICD-10-CM | POA: Diagnosis not present

## 2021-06-19 DIAGNOSIS — D631 Anemia in chronic kidney disease: Secondary | ICD-10-CM | POA: Diagnosis not present

## 2021-06-19 DIAGNOSIS — I5022 Chronic systolic (congestive) heart failure: Secondary | ICD-10-CM | POA: Diagnosis not present

## 2021-06-19 DIAGNOSIS — E876 Hypokalemia: Secondary | ICD-10-CM | POA: Diagnosis not present

## 2021-06-19 DIAGNOSIS — N184 Chronic kidney disease, stage 4 (severe): Secondary | ICD-10-CM | POA: Diagnosis not present

## 2021-06-19 DIAGNOSIS — E1122 Type 2 diabetes mellitus with diabetic chronic kidney disease: Secondary | ICD-10-CM | POA: Diagnosis not present

## 2021-06-19 DIAGNOSIS — Z8719 Personal history of other diseases of the digestive system: Secondary | ICD-10-CM | POA: Diagnosis not present

## 2021-06-19 DIAGNOSIS — I129 Hypertensive chronic kidney disease with stage 1 through stage 4 chronic kidney disease, or unspecified chronic kidney disease: Secondary | ICD-10-CM | POA: Diagnosis not present

## 2021-06-21 ENCOUNTER — Telehealth (HOSPITAL_COMMUNITY): Payer: Self-pay | Admitting: Adult Health

## 2021-06-21 MED ORDER — TORSEMIDE 20 MG PO TABS: ORAL_TABLET | ORAL | 11 refills | Status: DC

## 2021-06-21 NOTE — Telephone Encounter (Signed)
Called Rita [name] to complete her cardiomems reading.   Says she ran out of diuretics.

## 2021-06-21 NOTE — Telephone Encounter (Signed)
   I have sent in a refill for torsemide 80 mg twice a day with an extra 20 mg torsemide for 3 pound weight gain.   Bryer Gottsch NP-C  2:15 PM

## 2021-06-26 DIAGNOSIS — M1712 Unilateral primary osteoarthritis, left knee: Secondary | ICD-10-CM | POA: Diagnosis not present

## 2021-07-23 ENCOUNTER — Telehealth (HOSPITAL_COMMUNITY): Payer: Self-pay | Admitting: Adult Health

## 2021-07-23 NOTE — Telephone Encounter (Signed)
    Cardiomems Update   Implant Date   RHC     Called to provide recommendations.  PAD Range 21-24 Current PAD 30     Current Labs  6//29/22  Creatinine 2.06  BUN 40 Potassium 3.4    Recommendations   Recommend taking extra 20 mg torsemide for the next 2 days. Discussed limiting fluid intake to < 2 liters per day.  I have reviewed the patients PA monitoring at least weekly to bring PA pressures within optimal range.   Emmogene Simson NP-C  3:22 PM

## 2021-07-31 NOTE — Progress Notes (Signed)
ADVANCED HEART FAILURE CLINIC NOTE PCP: Dr Shary Decamp Nephrology: Dr. Malen Gauze HF Cardiology: Dr Shirlee Latch   HPI: [name] is a 65 y.o. 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    She has had multiple hospitalizations in the last year.  She was admitted January 2021 for COVID-19 pneumonia w/ acute hypoxic respiratory failure.  She did not require intubation.  She was treated with steroids and remdesivir.  She was readmitted twice in 7/21 for CHF exacerbations complicated by pleural effusion requiring IR guided thoracentesis.  She was readmitted again 8/21 for recurrent CHF and diuresed with IV Lasix.  Echo 8/21 showed moderately reduced systolic function, EF 35 to 40% with mild concentric left ventricular hypertrophy and grade 2 diastolic dysfunction, mild hypokinesis of the LV apical segment, RV systolic function was normal.    Readmitted recurrent CHF exacerbation plus AKI on CKD in 9/21.  Previous creatinine last admit was 1.76.  Creatinine on this admission elevated at 2.06. Chest x-ray demonstrated cardiomegaly and pulmonary venous congestion with recurrent right-sided pleural effusion.  BNP 673.  CBC showed chronic anemia, hemoglobin 8.9.  Also with worsening thrombocytopenia.  Platelets down to 84K. Echo repeated with EF 40-45%, RV ok, PASP 51 mmHg. Placed on milrinone to support RV for diuresis. Discharge weight 225 pounds. She was discharged on bidil but too expensive. Changed to Imdur + hydral. Cardiac MRI was done this admission, showed LV EF 41%, RV EF 27%,  D-shaped septum, RV insertion site LGE (RV>LV failure), no evidence for cardiac amyloidosis.   She has seen EP for Watchman evaluation given inability to take anticoagulation.  Dr. Lalla Brothers thought that she is not a  candidate for left atrial appendage occlusion given the risks associated with general anesthesia.  She returned for followup of CHF 12/21.  Stable NYHA II symptoms, GDMT limited by rising renal function. She is still on supplemental oxygen. Home sleep study ordered and arranged for Cardiomems.   Today she returns for HF follow up with her husband.  Knees and feet are swelling. She took extra torsemide for 2 days at request of Amy NP due to increased PAD readings. Still SOB walking on flat ground, says it is about the same. Wears oxygen at night. Denies CP, dizziness, or PND/Orthopnea. Appetite ok. Has been cutting down on fluids. No fever or chills. Weight at home 223 pounds. Taking all medications.   Labs (11/21): K 4.5, creatinine 2.11 Labs (5/22): K 4.1, creatinine 2.48 Labs (7/22): K 4.8, creatinine 2.56  ECG (personally reviewed): atrial fibrillation IVCD 122 ms  Cardiomems (PAD goal 21): 29 today.  PMH: 1. Chronic systolic CHF:  - Echo 8/21 EF 29-81%, mod HK, G2DD, RV normal - Echo 9/21 EF 40-45%, global hypokinesis, D-shaped septum, at least mildly decreased RV systolic function.  - Cardiac MRI (9/21): LV EF 41%, diffuse hypokinesis, D-shaped septum, RV mildly dilated with RV EF 27%, nonspecific RV insertion site LGE.  No evidence for cardiac amyloidosis.  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  - RHC (10/21): mean RA 9, PA 54/17 mean 33, mean PCWP 20, CI 3.33, PVR 1.9 WU - Cardiomems placed 12/2020. 2. CKD stage  IV 3. Type 2 diabetes 4. Pleural effusion s/p thoracentesis (due to CHF) 5. HTN 6. 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  7. COVID-19 infection 8. Atrial fibrillation: Chronic.  Not anticoagulated due to chronic anemia and GI bleeding.  - Not Watchman candidate per EP.  9. Anemia: Suspect due  to chronic low grade GI bleeding (small bowel AVMs).  10. Suspect OSA/OHS: She is on home oxygen.  11. Left breast cancer: Treated remotely with chemo/radiation.  12. GERD 13. Chronic mild thrombocytopenia  ROS: All systems negative except as listed in HPI, PMH and Problem List.  Social History   Socioeconomic History   Marital status: Married    Spouse name: Not on file   Number of children: Not on file   Years of education: Not on file   Highest education level: Not on file  Occupational History   Not on file  Tobacco Use   Smoking status: Never   Smokeless tobacco: Never  Vaping Use   Vaping Use: Never used  Substance and Sexual Activity   Alcohol use: Never   Drug use: Never   Sexual activity: Not on file  Other Topics Concern   Not on file  Social History Narrative   Not on file   Social Determinants of Health   Financial Resource Strain: Not on file  Food Insecurity: Not on file  Transportation Needs: Not on file  Physical Activity: Not on file  Stress: Not on file  Social Connections: Not on file  Intimate Partner Violence: Not on file   Family History  Problem Relation Age of Onset   Cancer Mother    Current Outpatient Medications  Medication Sig Dispense Refill   acetaminophen (TYLENOL) 325 MG tablet Take 650 mg by mouth every 6 (six) hours as needed for mild pain or headache.     allopurinol (ZYLOPRIM) 100 MG tablet Take 100 mg by mouth 2 (two) times daily.     aspirin EC 81 MG tablet Take 81 mg by mouth daily. Swallow whole.     blood glucose meter kit and supplies Dispense based on patient and insurance preference. Use up to four times daily as directed. (FOR ICD-10 E10.9, E11.9). 1 each 0   Blood Glucose Monitoring Suppl (FIFTY50 GLUCOSE METER 2.0) w/Device KIT See admin instructions.     carvedilol (COREG) 6.25 MG tablet Take 1 tablet (6.25 mg total) by mouth 2 (two) times daily with a meal. 60 tablet 6   ferrous sulfate 325 (65 FE) MG tablet Take  325 mg by mouth 2 (two) times daily with a meal.      glipiZIDE (GLUCOTROL XL) 5 MG 24 hr tablet Take 5 mg by mouth daily with breakfast.      hydrALAZINE (APRESOLINE) 25 MG tablet Take 25 mg by mouth 3 (three) times daily.     isosorbide mononitrate (IMDUR) 60 MG 24 hr tablet Take 1 tablet (60 mg total) by mouth daily. Needs appointment for further refill 90 tablet 0   Menthol-Methyl Salicylate (MUSCLE RUB) 10-15 % CREA Apply 1 application topically as needed for muscle pain (knees).      Multiple Vitamin (MULTIVITAMIN WITH MINERALS) TABS tablet Take 2 tablets by mouth daily. Alive gummy bears     OXYGEN Inhale 2 L into the lungs at bedtime. As needed     polyethylene glycol powder (GLYCOLAX/MIRALAX) 17 GM/SCOOP powder Take 1 Container by mouth daily as needed.     potassium chloride SA (KLOR-CON) 20 MEQ tablet Take 20 mEq  by mouth 2 (two) times daily.     torsemide (DEMADEX) 20 MG tablet Take 80 mg twice a day with extra 20 mg as needed for 3 pound weight gain. 270 tablet 11   No current facility-administered medications for this encounter.   BP 124/60   Pulse 81   Wt 103.1 kg (227 lb 3.2 oz)   SpO2 100%   BMI 42.93 kg/m   Wt Readings from Last 3 Encounters:  08/01/21 103.1 kg (227 lb 3.2 oz)  06/06/21 101.8 kg (224 lb 6.4 oz)  12/26/20 99.3 kg (219 lb)   PHYSICAL EXAM: General:  NAD. No resp difficulty, walked into clinic with rolling walker HEENT: Normal Neck: Supple. JVP to ear. Carotids 2+ bilat; no bruits. No lymphadenopathy or thryomegaly appreciated. Cor: PMI nondisplaced. Irregular rate & rhythm. No rubs, gallops or murmurs. Lungs: Clear Abdomen: Soft, nontender, nondistended. No hepatosplenomegaly. No bruits or masses. Good bowel sounds. Extremities: No cyanosis, clubbing, rash, 3+ LE edema, some weeping present. Neuro: Alert & oriented x 3, cranial nerves grossly intact. Moves all 4 extremities w/o difficulty. Affect pleasant.  ASSESSMENT & PLAN: 1.  Acute on chronic  systolic CHF: Prominent signs of RV failure.  9/21 echo showed EF 40-45%, global hypokinesis, D-shaped IV septum suggestive of RV pressure/volume overload, RV poorly visualized but appeared normal in size with at least mild systolic dysfunction. Cause of cardiomyopathy is uncertain.  She has history of COVID-19 1/21, cannot rule out viral myocarditis. Chemotherapy-mediated CMP is possible (not sure what chemo she had remotely for breast cancer).  Cardiac MRI showed LV EF 41% with diffuse hypokinesis, RV mildly dilated but with EF 27% and D-shaped septum, nonspecific RV insertion site LGE.  Suspect RV > LV failure. Cardiac MRI is not suggestive of amyloidosis, no M-spike on myeloma panel. We have not ruled out CAD (cath deferred given lack of ischemic CP and CKD). She has had recurrent HF admissions this year.  Milrinone started primarily for RV support last admit 9/21 and titrated off. She had cardiomems placed 1/22. NYHA class III-IIIb symptoms, functional capacity limited by body habitus and knee pain.  She is volume overloaded on exam and by Cardiomems of 29 (goal 21). - Give 80 mg IV lasix x 1 + 40 KCl  today in clinic. Resume torsemide tomorrow. BMET/BNP today, repeat BMET in 7 days. - Continue Coreg 6.25 mg bid.  - Continue hydralazine 25 mg tid and Imdur to 60 mg daily. - Off SGLT2i  with worsening renal function - No ARNI/spironolactone for now with elevated creatinine.  - Home Health to place Unna boots 2. Chronic hypoxemic respiratory failure: Suspect OHS/OSA.  She was using home oxygen prior to COVID-19 PNA.  - Continue supplemental oxygen at night. - We assisted her with her home sleep study application on her tablet today. 3. Atrial fibrillation: Chronic, all ECGs since 7/21 show atrial fibrillation.  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 Rate is controlled.  - She cannot be cardioverted at this point as she cannot be chronically  anticoagulated. - Evaluated by Dr. Quentin Ore for [place] but not a suitable candidate given multiple severe medical comorbidities and risks associated with general anesthesia. 4. GI bleeding: Recurrent, from small bowel AVMs.  - Unable to be anticoagulated. No abnormal bleeding. - CBC today.  5. Right pleural effusion: Suspect due to CHF.  Has had thoracentesis.  6. H/o DVT/PE: Provoked (post-surgery). No longer on anticoagulation due to h/o significant GIB.  Has IVC filter.  7. CKD: Stage IV. Baseline SCr ~2.2.  - Followed by Dr. Royce Macadamia. - BMET today.  Followup 1-2 weeks with APP to reassess volume status.  [place], [place] 08/01/2021

## 2021-08-01 ENCOUNTER — Ambulatory Visit (HOSPITAL_COMMUNITY)
Admission: RE | Admit: 2021-08-01 | Discharge: 2021-08-01 | Disposition: A | Discharge status: Home / Self Care | Payer: PPO | Source: Ambulatory Visit | Attending: Family Medicine | Admitting: Family Medicine

## 2021-08-01 ENCOUNTER — Other Ambulatory Visit: Payer: Self-pay

## 2021-08-01 ENCOUNTER — Encounter (HOSPITAL_COMMUNITY): Payer: Self-pay

## 2021-08-01 VITALS — BP 124/60 | HR 81 | Wt 227.2 lb

## 2021-08-01 DIAGNOSIS — N184 Chronic kidney disease, stage 4 (severe): Secondary | ICD-10-CM | POA: Insufficient documentation

## 2021-08-01 DIAGNOSIS — D631 Anemia in chronic kidney disease: Secondary | ICD-10-CM | POA: Diagnosis not present

## 2021-08-01 DIAGNOSIS — J90 Pleural effusion, not elsewhere classified: Secondary | ICD-10-CM | POA: Diagnosis not present

## 2021-08-01 DIAGNOSIS — I4821 Permanent atrial fibrillation: Secondary | ICD-10-CM | POA: Diagnosis not present

## 2021-08-01 DIAGNOSIS — I130 Hypertensive heart and chronic kidney disease with heart failure and stage 1 through stage 4 chronic kidney disease, or unspecified chronic kidney disease: Secondary | ICD-10-CM | POA: Diagnosis not present

## 2021-08-01 DIAGNOSIS — Z8719 Personal history of other diseases of the digestive system: Secondary | ICD-10-CM | POA: Diagnosis not present

## 2021-08-01 DIAGNOSIS — I482 Chronic atrial fibrillation, unspecified: Secondary | ICD-10-CM | POA: Insufficient documentation

## 2021-08-01 DIAGNOSIS — I5021 Acute systolic (congestive) heart failure: Secondary | ICD-10-CM | POA: Diagnosis not present

## 2021-08-01 DIAGNOSIS — Z86718 Personal history of other venous thrombosis and embolism: Secondary | ICD-10-CM | POA: Insufficient documentation

## 2021-08-01 DIAGNOSIS — Z8709 Personal history of other diseases of the respiratory system: Secondary | ICD-10-CM | POA: Diagnosis not present

## 2021-08-01 DIAGNOSIS — E1122 Type 2 diabetes mellitus with diabetic chronic kidney disease: Secondary | ICD-10-CM | POA: Insufficient documentation

## 2021-08-01 DIAGNOSIS — I5023 Acute on chronic systolic (congestive) heart failure: Secondary | ICD-10-CM | POA: Diagnosis not present

## 2021-08-01 DIAGNOSIS — Z7982 Long term (current) use of aspirin: Secondary | ICD-10-CM | POA: Diagnosis not present

## 2021-08-01 DIAGNOSIS — Z853 Personal history of malignant neoplasm of breast: Secondary | ICD-10-CM | POA: Diagnosis not present

## 2021-08-01 DIAGNOSIS — E669 Obesity, unspecified: Secondary | ICD-10-CM | POA: Insufficient documentation

## 2021-08-01 DIAGNOSIS — Z8616 Personal history of COVID-19: Secondary | ICD-10-CM | POA: Diagnosis not present

## 2021-08-01 DIAGNOSIS — I5022 Chronic systolic (congestive) heart failure: Secondary | ICD-10-CM | POA: Diagnosis not present

## 2021-08-01 DIAGNOSIS — J9611 Chronic respiratory failure with hypoxia: Secondary | ICD-10-CM | POA: Insufficient documentation

## 2021-08-01 DIAGNOSIS — R400 Somnolence: Secondary | ICD-10-CM

## 2021-08-01 DIAGNOSIS — Z86711 Personal history of pulmonary embolism: Secondary | ICD-10-CM | POA: Diagnosis not present

## 2021-08-01 DIAGNOSIS — Z9221 Personal history of antineoplastic chemotherapy: Secondary | ICD-10-CM | POA: Diagnosis not present

## 2021-08-01 DIAGNOSIS — Z7984 Long term (current) use of oral hypoglycemic drugs: Secondary | ICD-10-CM | POA: Diagnosis not present

## 2021-08-01 DIAGNOSIS — Z923 Personal history of irradiation: Secondary | ICD-10-CM | POA: Diagnosis not present

## 2021-08-01 DIAGNOSIS — Z79899 Other long term (current) drug therapy: Secondary | ICD-10-CM | POA: Diagnosis not present

## 2021-08-01 LAB — BASIC METABOLIC PANEL
Anion gap: 10 (ref 5–15)
BUN: 47 mg/dL — ABNORMAL HIGH (ref 8–23)
CO2: 29 mmol/L (ref 22–32)
Calcium: 9.3 mg/dL (ref 8.9–10.3)
Chloride: 98 mmol/L (ref 98–111)
Creatinine, Ser: 1.89 mg/dL — ABNORMAL HIGH (ref 0.44–1.00)
GFR, Estimated: 29 mL/min — ABNORMAL LOW (ref >60)
Glucose, Bld: 170 mg/dL — ABNORMAL HIGH (ref 70–99)
Potassium: 4.1 mmol/L (ref 3.5–5.1)
Sodium: 137 mmol/L (ref 135–145)

## 2021-08-01 LAB — BRAIN NATRIURETIC PEPTIDE: B Natriuretic Peptide: 527.6 pg/mL — ABNORMAL HIGH (ref 0.0–100.0)

## 2021-08-01 MED ORDER — FUROSEMIDE 10 MG/ML IJ SOLN
80.0000 mg | Freq: Once | INTRAMUSCULAR | Status: AC
  Administered 2021-08-01: 80 mg via INTRAVENOUS

## 2021-08-01 MED ORDER — POTASSIUM CHLORIDE CRYS ER 20 MEQ PO TBCR
40.0000 meq | EXTENDED_RELEASE_TABLET | Freq: Once | ORAL | Status: AC
  Administered 2021-08-01: 40 meq via ORAL

## 2021-08-01 NOTE — Patient Instructions (Signed)
No medication changes today  Labs today We will only contact you if something comes back abnormal or we need to make some changes. Otherwise no news is good news!  You have been referred to home health to assist with unna boots.  Your physician recommends that you schedule a follow-up appointment in: 1-2 weeks  in the Advanced Practitioners (PA/NP) Clinic    Do the following things EVERYDAY: Weigh yourself in the morning before breakfast. Write it down and keep it in a log. Take your medicines as prescribed Eat low salt foods--Limit salt (sodium) to 2000 mg per day.  Stay as active as you can everyday Limit all fluids for the day to less than 2 liters  At the [facility], you and your health needs are our priority. As part of our continuing mission to provide you with exceptional heart care, we have created designated Provider Care Teams. These Care Teams include your primary Cardiologist (physician) and Advanced Practice Providers (APPs- Physician Assistants and Nurse Practitioners) who all work together to provide you with the care you need, when you need it.   You may see any of the following providers on your designated Care Team at your next follow up: Dr Glori Bickers Dr Loralie Champagne Dr Patrice Paradise, NP Lyda Jester, [place] Ginnie Smart Audry Riles, PharmD   Please be sure to bring in all your medications bottles to every appointment.

## 2021-08-01 NOTE — Progress Notes (Signed)
Right upper arm iv site d/c'd as ordered. Site benign.

## 2021-08-01 NOTE — Progress Notes (Signed)
ReDS Vest / Clip - 08/01/21 1400       ReDS Vest / Clip   Station Marker A    Ruler Value 27    ReDS Value Range High volume overload    ReDS Actual Value 42

## 2021-08-01 NOTE — Progress Notes (Signed)
Instructions for ordered [place] home sleep study reviewed with patient.  I also assisted to download the [facility] App onto her device that was brought to clinic appt to assist with the test at home.  APP successfully downloaded and patient acknowledges understanding of use for home.  I asked that she call with nay questions or concerns with the study.

## 2021-08-14 NOTE — Progress Notes (Signed)
ADVANCED HEART FAILURE CLINIC NOTE PCP: Dr Bea Graff Nephrology: Dr. Royce Macadamia HF Cardiology: Dr Aundra Dubin   HPI: Rita [name] is a 65 y.o. 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    She has had multiple hospitalizations in the last year.  She was admitted January 2021 for COVID-19 pneumonia w/ acute hypoxic respiratory failure.  She did not require intubation.  She was treated with steroids and remdesivir.  She was readmitted twice in 7/21 for CHF exacerbations complicated by pleural effusion requiring IR guided thoracentesis.  She was readmitted again 8/21 for recurrent CHF and diuresed with IV Lasix.  Echo 8/21 showed moderately reduced systolic function, EF 35 to 40% with mild concentric left ventricular hypertrophy and grade 2 diastolic dysfunction, mild hypokinesis of the LV apical segment, RV systolic function was normal.    Readmitted recurrent CHF exacerbation plus AKI on CKD in 9/21.  Previous creatinine last admit was 1.76.  Creatinine on this admission elevated at 2.06. Chest x-ray demonstrated cardiomegaly and pulmonary venous congestion with recurrent right-sided pleural effusion.  BNP 673.  CBC showed chronic anemia, hemoglobin 8.9.  Also with worsening thrombocytopenia.  Platelets down to 84K. Echo repeated with EF 40-45%, RV ok, PASP 51 mmHg. Placed on milrinone to support RV for diuresis. Discharge weight 225 pounds. She was discharged on bidil but too expensive. Changed to Imdur + hydral. Cardiac MRI was done this admission, showed LV EF 41%, RV EF 27%,  D-shaped septum, RV insertion site LGE (RV>LV failure), no evidence for cardiac amyloidosis.   She has seen EP for Watchman evaluation given inability to take anticoagulation.  Dr. Quentin Ore thought that she is not a  candidate for left atrial appendage occlusion given the risks associated with general anesthesia.  She returned for followup of CHF 12/21.  Stable NYHA II symptoms, GDMT limited by rising renal function. She was still on supplemental oxygen. Home sleep study ordered and arranged for Cardiomems.   S/p Cardiomems 1/22.  She was volume overloaded at follow up on 08/01/21, PAD was 29 (goal 21) and received 80 mg IV lasix in clinic and unna boots ordered via [facility].  Today she returns for HF follow up with her husband. She says her breathing is "a lot" better, she is dyspneic walking further distances on flat ground. She is in [facility] today due to her knee pain. Denies CP, dizziness, or PND/Orthopnea. Appetite ok. No fever or chills. Weight at home 223 pounds. Taking all medications. Drinking <2L/fluid day. Thinks she may be consuming extra salt in foods. Was unable to get Unna boots as ordered last visit. Wears oxygen at night.  Labs (11/21): K 4.5, creatinine 2.11 Labs (5/22): K 4.1, creatinine 2.48 Labs (7/22): K 4.8, creatinine 2.56 Labs (8/22): K 4.1, creatinine 1.89  ECG (personally reviewed): none ordered today.  Cardiomems (PAD goal 21): 30 on 08/13/21.  PMH: 1. Chronic systolic CHF:  - Echo 3/78 EF 35-40%, mod HK, G2DD, RV normal - Echo 9/21 EF 40-45%, global hypokinesis, D-shaped septum, at least mildly decreased RV systolic function.  - Cardiac MRI (9/21): LV EF 41%, diffuse hypokinesis, D-shaped septum, RV  mildly dilated with RV EF 27%, nonspecific RV insertion site LGE.  No evidence for cardiac amyloidosis.  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  - RHC (10/21): mean RA 9, PA 54/17 mean 33, mean PCWP 20, CI 3.33, PVR 1.9 WU - Cardiomems placed 12/2020. 2. CKD stage IV 3. Type 2 diabetes 4.  Pleural effusion s/p thoracentesis (due to CHF) 5. HTN 6. 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  7. COVID-19 infection 8. Atrial fibrillation: Chronic.  Not anticoagulated due to chronic anemia and GI bleeding.  - Not Watchman candidate per EP.  9. Anemia: Suspect due to chronic low grade GI bleeding (small bowel AVMs).  10. Suspect OSA/OHS: She is on home oxygen.  11. Left breast cancer: Treated remotely with chemo/radiation.  12. GERD 13. Chronic mild thrombocytopenia  ROS: All systems negative except as listed in HPI, PMH and Problem List.  Social History   Socioeconomic History   Marital status: Married    Spouse name: Not on file   Number of children: Not on file   Years of education: Not on file   Highest education level: Not on file  Occupational History   Not on file  Tobacco Use   Smoking status: Never   Smokeless tobacco: Never  Vaping Use   Vaping Use: Never used  Substance and Sexual Activity   Alcohol use: Never   Drug use: Never   Sexual activity: Not on file  Other Topics Concern   Not on file  Social History Narrative   Not on file   Social Determinants of Health   Financial Resource Strain: Not on file  Food Insecurity: Not on file  Transportation Needs: Not on file  Physical Activity: Not on file  Stress: Not on file  Social Connections: Not on file  Intimate Partner Violence: Not on file   Family History  Problem Relation Age of Onset   Cancer Mother    Current Outpatient Medications  Medication Sig Dispense Refill   acetaminophen (TYLENOL) 325 MG tablet Take 650 mg by mouth every 6 (six) hours as needed for mild pain or headache.     allopurinol (ZYLOPRIM) 100 MG tablet Take 100 mg by mouth 2 (two) times daily.     aspirin EC 81 MG tablet Take 81 mg by mouth daily. Swallow whole.     blood glucose meter kit and supplies Dispense based on patient and insurance preference. Use up to four times daily as directed. (FOR ICD-10 E10.9, E11.9). 1 each 0    Blood Glucose Monitoring Suppl (FIFTY50 GLUCOSE METER 2.0) w/Device KIT See admin instructions.     carvedilol (COREG) 6.25 MG tablet Take 1 tablet (6.25 mg total) by mouth 2 (two) times daily with a meal. 60 tablet 6   ferrous sulfate 325 (65 FE) MG tablet Take 325 mg by mouth 2 (two) times daily with a meal.      glipiZIDE (GLUCOTROL XL) 5 MG 24 hr tablet Take 5 mg by mouth daily with breakfast.      hydrALAZINE (APRESOLINE) 25 MG tablet Take 25 mg by mouth 3 (three) times daily.     isosorbide mononitrate (IMDUR) 60 MG 24 hr tablet Take 1 tablet (60 mg total) by mouth daily. Needs appointment for further refill 90 tablet 0   Menthol-Methyl Salicylate (MUSCLE RUB) 10-15 % CREA Apply 1 application topically as needed for muscle pain (knees).      Multiple Vitamin (MULTIVITAMIN WITH MINERALS) TABS tablet Take 2 tablets by mouth daily. Alive gummy bears  OXYGEN Inhale 2 L into the lungs at bedtime. As needed     polyethylene glycol powder (GLYCOLAX/MIRALAX) 17 GM/SCOOP powder Take 1 Container by mouth daily as needed.     potassium chloride SA (KLOR-CON) 20 MEQ tablet Take 20 mEq by mouth 2 (two) times daily.     torsemide (DEMADEX) 20 MG tablet Take 80 mg twice a day with extra 20 mg as needed for 3 pound weight gain. 270 tablet 11   No current facility-administered medications for this encounter.   BP 128/60   Pulse 74   Wt 102.6 kg (226 lb 3.2 oz)   SpO2 96%   BMI 42.74 kg/m   Wt Readings from Last 3 Encounters:  08/15/21 102.6 kg (226 lb 3.2 oz)  08/01/21 103.1 kg (227 lb 3.2 oz)  06/06/21 101.8 kg (224 lb 6.4 oz)   PHYSICAL EXAM: General:  NAD. No resp difficulty, arrived in [facility] HEENT: Normal Neck: Supple. JVP to ear. Carotids 2+ bilat; no bruits. No lymphadenopathy or thryomegaly appreciated. Cor: PMI nondisplaced. Irregular rate & rhythm. No rubs, gallops, murmurs Lungs: Clear Abdomen: Obese, nontender, nondistended. No hepatosplenomegaly. No bruits or masses. Good bowel  sounds. Extremities: No cyanosis, clubbing, rash, 2-3+ BLE edema; skin tear to right lower shin with weeping. Neuro: Alert & oriented x 3, cranial nerves grossly intact. Moves all 4 extremities w/o difficulty. Affect pleasant.  ASSESSMENT & PLAN: 1.  Acute on chronic systolic CHF: Prominent signs of RV failure.  9/21 echo showed EF 40-45%, global hypokinesis, D-shaped IV septum suggestive of RV pressure/volume overload, RV poorly visualized but appeared normal in size with at least mild systolic dysfunction. Cause of cardiomyopathy is uncertain.  She has history of COVID-19 1/21, cannot rule out viral myocarditis. Chemotherapy-mediated CMP is possible (not sure what chemo she had remotely for breast cancer).  Cardiac MRI showed LV EF 41% with diffuse hypokinesis, RV mildly dilated but with EF 27% and D-shaped septum, nonspecific RV insertion site LGE.  Suspect RV > LV failure. Cardiac MRI is not suggestive of amyloidosis, no M-spike on myeloma panel. We have not ruled out CAD (cath deferred given lack of ischemic CP and CKD). She has had recurrent HF admissions this year.  Milrinone started primarily for RV support last admit 9/21 and titrated off. She had cardiomems placed 1/22. Better NYHA class III symptoms, functional capacity limited by body habitus and knee pain.  She is volume overloaded on exam and by Cardiomems of 30 (goal 21). - Take metolazone 2.5 + 40 KCl today and tomorrow with home dose or torsemide. BMET/BNP today, repeat BMET in 7 days. - Will arrange for Home Health to place Unna boots. If unable to place boots, will need to wear compression hose, given Rx today. - Continue Coreg 6.25 mg bid.  - Continue hydralazine 25 mg tid + Imdur 60 mg daily. - Off SGLT2i  with worsening renal function. - No ARNI/spironolactone for now with elevated creatinine.  2. Chronic hypoxemic respiratory failure: Suspect OHS/OSA.  She was using home oxygen prior to COVID-19 PNA.  - Continue supplemental  oxygen at night. - We assisted her with her home sleep study application on her tablet, she plans on competing study soon. 3. Atrial fibrillation: Chronic, all ECGs since 7/21 show atrial fibrillation.  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 Rate is controlled.  - She cannot be cardioverted at this point as she cannot be chronically anticoagulated. - Evaluated by Dr. Quentin Ore  for [facility] but not a suitable candidate given multiple severe medical comorbidities and risks associated with general anesthesia. 4. GI bleeding: Recurrent, from small bowel AVMs.  - Unable to be anticoagulated. No abnormal bleeding. 5. Right pleural effusion: Suspect due to CHF.  Has had thoracentesis.  6. H/o DVT/PE: Provoked (post-surgery). No longer on anticoagulation due to h/o significant GIB. Has IVC filter.  7. CKD: Stage IV. Baseline SCr ~2.2. Follow closely with diuresis. - Followed by Dr. Royce Macadamia. - BMET today.  Followup 3-4 weeks with APP to reassess volume.  [place], [place] 08/15/2021

## 2021-08-15 ENCOUNTER — Telehealth (HOSPITAL_COMMUNITY): Payer: Self-pay

## 2021-08-15 ENCOUNTER — Ambulatory Visit (HOSPITAL_COMMUNITY)
Admission: RE | Admit: 2021-08-15 | Discharge: 2021-08-15 | Disposition: A | Discharge status: Home / Self Care | Payer: PPO | Source: Ambulatory Visit | Attending: Family Medicine | Admitting: Family Medicine

## 2021-08-15 ENCOUNTER — Other Ambulatory Visit: Payer: Self-pay

## 2021-08-15 ENCOUNTER — Encounter (HOSPITAL_COMMUNITY): Payer: Self-pay

## 2021-08-15 VITALS — BP 128/60 | HR 74 | Wt 226.2 lb

## 2021-08-15 DIAGNOSIS — M25569 Pain in unspecified knee: Secondary | ICD-10-CM | POA: Diagnosis not present

## 2021-08-15 DIAGNOSIS — Z09 Encounter for follow-up examination after completed treatment for conditions other than malignant neoplasm: Secondary | ICD-10-CM | POA: Diagnosis not present

## 2021-08-15 DIAGNOSIS — Z86718 Personal history of other venous thrombosis and embolism: Secondary | ICD-10-CM

## 2021-08-15 DIAGNOSIS — J9611 Chronic respiratory failure with hypoxia: Secondary | ICD-10-CM | POA: Diagnosis not present

## 2021-08-15 DIAGNOSIS — Z7984 Long term (current) use of oral hypoglycemic drugs: Secondary | ICD-10-CM | POA: Diagnosis not present

## 2021-08-15 DIAGNOSIS — I130 Hypertensive heart and chronic kidney disease with heart failure and stage 1 through stage 4 chronic kidney disease, or unspecified chronic kidney disease: Secondary | ICD-10-CM | POA: Insufficient documentation

## 2021-08-15 DIAGNOSIS — I5021 Acute systolic (congestive) heart failure: Secondary | ICD-10-CM

## 2021-08-15 DIAGNOSIS — Z9981 Dependence on supplemental oxygen: Secondary | ICD-10-CM | POA: Diagnosis not present

## 2021-08-15 DIAGNOSIS — I5023 Acute on chronic systolic (congestive) heart failure: Secondary | ICD-10-CM | POA: Diagnosis not present

## 2021-08-15 DIAGNOSIS — J90 Pleural effusion, not elsewhere classified: Secondary | ICD-10-CM | POA: Insufficient documentation

## 2021-08-15 DIAGNOSIS — E1122 Type 2 diabetes mellitus with diabetic chronic kidney disease: Secondary | ICD-10-CM | POA: Insufficient documentation

## 2021-08-15 DIAGNOSIS — Z8719 Personal history of other diseases of the digestive system: Secondary | ICD-10-CM | POA: Diagnosis not present

## 2021-08-15 DIAGNOSIS — Z86711 Personal history of pulmonary embolism: Secondary | ICD-10-CM | POA: Insufficient documentation

## 2021-08-15 DIAGNOSIS — Z8709 Personal history of other diseases of the respiratory system: Secondary | ICD-10-CM | POA: Diagnosis not present

## 2021-08-15 DIAGNOSIS — Z8616 Personal history of COVID-19: Secondary | ICD-10-CM | POA: Diagnosis not present

## 2021-08-15 DIAGNOSIS — Z79899 Other long term (current) drug therapy: Secondary | ICD-10-CM | POA: Diagnosis not present

## 2021-08-15 DIAGNOSIS — N1832 Chronic kidney disease, stage 3b: Secondary | ICD-10-CM | POA: Diagnosis not present

## 2021-08-15 DIAGNOSIS — D631 Anemia in chronic kidney disease: Secondary | ICD-10-CM | POA: Diagnosis not present

## 2021-08-15 DIAGNOSIS — I5043 Acute on chronic combined systolic (congestive) and diastolic (congestive) heart failure: Secondary | ICD-10-CM | POA: Diagnosis not present

## 2021-08-15 DIAGNOSIS — N184 Chronic kidney disease, stage 4 (severe): Secondary | ICD-10-CM | POA: Diagnosis not present

## 2021-08-15 DIAGNOSIS — Z7982 Long term (current) use of aspirin: Secondary | ICD-10-CM | POA: Diagnosis not present

## 2021-08-15 DIAGNOSIS — I482 Chronic atrial fibrillation, unspecified: Secondary | ICD-10-CM | POA: Diagnosis not present

## 2021-08-15 DIAGNOSIS — I4821 Permanent atrial fibrillation: Secondary | ICD-10-CM

## 2021-08-15 LAB — BRAIN NATRIURETIC PEPTIDE: B Natriuretic Peptide: 713.2 pg/mL — ABNORMAL HIGH (ref 0.0–100.0)

## 2021-08-15 LAB — BASIC METABOLIC PANEL
Anion gap: 9 (ref 5–15)
BUN: 37 mg/dL — ABNORMAL HIGH (ref 8–23)
CO2: 29 mmol/L (ref 22–32)
Calcium: 9.4 mg/dL (ref 8.9–10.3)
Chloride: 99 mmol/L (ref 98–111)
Creatinine, Ser: 2.51 mg/dL — ABNORMAL HIGH (ref 0.44–1.00)
GFR, Estimated: 20 mL/min — ABNORMAL LOW (ref >60)
Glucose, Bld: 121 mg/dL — ABNORMAL HIGH (ref 70–99)
Potassium: 4.1 mmol/L (ref 3.5–5.1)
Sodium: 137 mmol/L (ref 135–145)

## 2021-08-15 MED ORDER — METOLAZONE 2.5 MG PO TABS: 2.5000 mg | ORAL_TABLET | Freq: Every day | ORAL | 0 refills | Status: DC

## 2021-08-15 NOTE — Telephone Encounter (Signed)
Spoke and advised patient of annotation and she will have her labs obtained by [facility] and she also agreed understanding.

## 2021-08-15 NOTE — Patient Instructions (Addendum)
Labs were performed today, if labs are abnormal the clinic will call you  TAKE Metolazone 2.5 mg daily for 2 days ONLY with a extra 40 meq of potassium chloride  START wearing compression hose   Your physician recommends that you schedule a follow-up appointment in: 4 weeks  At the [facility], you and your health needs are our priority. As part of our continuing mission to provide you with exceptional heart care, we have created designated Provider Care Teams. These Care Teams include your primary Cardiologist (physician) and Advanced Practice Providers (APPs- Physician Assistants and Nurse Practitioners) who all work together to provide you with the care you need, when you need it.   You may see any of the following providers on your designated Care Team at your next follow up: Dr Glori Bickers Dr Loralie Champagne Dr Patrice Paradise, NP Lyda Jester, [place] Ginnie Smart Audry Riles, PharmD   Please be sure to bring in all your medications bottles to every appointment.    If you have any questions or concerns before your next appointment please send [place] a message through [place] or call our office at [phone].    TO LEAVE A MESSAGE FOR THE NURSE SELECT OPTION 2, PLEASE LEAVE A MESSAGE INCLUDING: YOUR NAME DATE OF BIRTH CALL BACK NUMBER REASON FOR CALL**this is important as we prioritize the call backs  YOU WILL RECEIVE A CALL BACK THE SAME DAY AS LONG AS YOU CALL BEFORE 4:00 PM

## 2021-08-22 ENCOUNTER — Other Ambulatory Visit (HOSPITAL_COMMUNITY): Payer: Self-pay | Admitting: Adult Health

## 2021-08-22 DIAGNOSIS — I5042 Chronic combined systolic (congestive) and diastolic (congestive) heart failure: Secondary | ICD-10-CM | POA: Diagnosis not present

## 2021-08-23 LAB — BASIC METABOLIC PANEL
BUN/Creatinine Ratio: 24 (ref 12–28)
BUN: 54 mg/dL — ABNORMAL HIGH (ref 8–27)
CO2: 30 mmol/L — ABNORMAL HIGH (ref 20–29)
Calcium: 9.5 mg/dL (ref 8.7–10.3)
Chloride: 96 mmol/L (ref 96–106)
Creatinine, Ser: 2.25 mg/dL — ABNORMAL HIGH (ref 0.57–1.00)
Glucose: 101 mg/dL — ABNORMAL HIGH (ref 65–99)
Potassium: 4.2 mmol/L (ref 3.5–5.2)
Sodium: 140 mmol/L (ref 134–144)
eGFR: 23 mL/min/{1.73_m2} — ABNORMAL LOW (ref >59)

## 2021-09-10 NOTE — Progress Notes (Signed)
ADVANCED HEART FAILURE CLINIC NOTE PCP: Dr Bea Graff Nephrology: Dr. Royce Macadamia HF Cardiology: Dr Aundra Dubin   HPI: Rita [name] is a 65 y.o. 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    She has had multiple hospitalizations in the last year.  She was admitted January 2021 for COVID-19 pneumonia w/ acute hypoxic respiratory failure.  She did not require intubation.  She was treated with steroids and remdesivir.  She was readmitted twice in 7/21 for CHF exacerbations complicated by pleural effusion requiring IR guided thoracentesis.  She was readmitted again 8/21 for recurrent CHF and diuresed with IV Lasix.  Echo 8/21 showed moderately reduced systolic function, EF 35 to 40% with mild concentric left ventricular hypertrophy and grade 2 diastolic dysfunction, mild hypokinesis of the LV apical segment, RV systolic function was normal.    Readmitted recurrent CHF exacerbation plus AKI on CKD in 9/21.  Previous creatinine last admit was 1.76.  Creatinine on this admission elevated at 2.06. Chest x-ray demonstrated cardiomegaly and pulmonary venous congestion with recurrent right-sided pleural effusion.  BNP 673.  CBC showed chronic anemia, hemoglobin 8.9.  Also with worsening thrombocytopenia.  Platelets down to 84K. Echo repeated with EF 40-45%, RV ok, PASP 51 mmHg. Placed on milrinone to support RV for diuresis. Discharge weight 225 pounds. She was discharged on bidil but too expensive. Changed to Imdur + hydral. Cardiac MRI was done this admission, showed LV EF 41%, RV EF 27%,  D-shaped septum, RV insertion site LGE (RV>LV failure), no evidence for cardiac amyloidosis.   She has seen EP for Watchman evaluation given inability to take anticoagulation.  Dr. Quentin Ore thought that she is not a  candidate for left atrial appendage occlusion given the risks associated with general anesthesia.  She returned for followup of CHF 12/21.  Stable NYHA II symptoms, GDMT limited by rising renal function. She was still on supplemental oxygen. Home sleep study ordered and arranged for Cardiomems.   S/p Cardiomems 1/22.  She was volume overloaded at follow up on 08/01/21, PAD was 29 (goal 21) and received 80 mg IV lasix in clinic and unna boots ordered via [place], however these were not done. She remained volume overloaded at next visit (08/15/21), Cardiomems 30, and instructed to take metolazone 2.5 x 2 doses, but 2nd dose held with worsening kidney function.  Today she returns for HF follow up with her husband. She feels bad today. Breathing "got more short yesterday."  Main complaint is her knee pain keeping her up at night. Denies CP, dizziness, edema, or PND/Orthopnea. She wears oxygen at night. She is not weighing at home. Taking all medications. Continues to eat salty foods. Has not been able to do sleep study. She never received Unna Boots after last visit, but did pick up her compression hose and said her swelling did get some better.   Labs (11/21): K 4.5, creatinine 2.11 Labs (5/22): K 4.1, creatinine 2.48 Labs (7/22): K 4.8, creatinine 2.56 Labs (8/22): K 4.1, creatinine 1.89 Labs (9/22): K 4.2, creatinine 2.25  ECG (personally reviewed): none ordered today.  Cardiomems (PAD goal 21): 32 on 09/10/21.  ReDs:  37%  PMH: 1. Chronic systolic CHF:  - Echo 2/83 EF 35-40%, mod HK, G2DD, RV normal - Echo 9/21 EF 40-45%, global hypokinesis, D-shaped septum, at least mildly decreased RV systolic function.  - Cardiac MRI (9/21): LV EF 41%, diffuse hypokinesis, D-shaped septum,  RV mildly dilated with RV EF 27%, nonspecific RV insertion site LGE.  No evidence for cardiac amyloidosis.  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  - RHC (10/21): mean RA 9, PA 54/17 mean 33, mean PCWP 20, CI 3.33, PVR 1.9 WU - Cardiomems placed 12/2020. 2. CKD stage IV 3. Type 2 diabetes 4. Pleural effusion s/p thoracentesis (due to CHF) 5. HTN 6. 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  7. COVID-19 infection 8. Atrial fibrillation: Chronic.  Not anticoagulated due to chronic anemia and GI bleeding.  - Not Watchman candidate per EP.  9. Anemia: Suspect due to chronic low grade GI bleeding (small bowel AVMs).  10. Suspect OSA/OHS: She is on home oxygen.  11. Left breast cancer: Treated remotely with chemo/radiation.  12. GERD 13. Chronic mild thrombocytopenia  ROS: All systems negative except as listed in HPI, PMH and Problem List.  Social History   Socioeconomic History   Marital status: Married    Spouse name: Not on file   Number of children: Not on file   Years of education: Not on file   Highest education level: Not on file  Occupational History   Not on file  Tobacco Use   Smoking status: Never   Smokeless tobacco: Never  Vaping Use   Vaping Use: Never used  Substance and Sexual Activity   Alcohol use: Never   Drug use: Never   Sexual activity: Not on file  Other Topics Concern   Not on file  Social History Narrative   Not on file   Social Determinants of Health   Financial Resource Strain: Not on file  Food Insecurity: Not on file  Transportation Needs: Not on file  Physical Activity: Not on file  Stress: Not on file  Social Connections: Not on file  Intimate Partner Violence: Not on file   Family History  Problem Relation Age of Onset   Cancer Mother    Current Outpatient Medications  Medication Sig Dispense Refill   acetaminophen (TYLENOL) 325 MG tablet Take 650 mg by mouth every 6 (six) hours as needed for mild pain or headache.     allopurinol (ZYLOPRIM) 100 MG tablet Take 100 mg by  mouth 2 (two) times daily.     aspirin EC 81 MG tablet Take 81 mg by mouth daily. Swallow whole.     blood glucose meter kit and supplies Dispense based on patient and insurance preference. Use up to four times daily as directed. (FOR ICD-10 E10.9, E11.9). 1 each 0   Blood Glucose Monitoring Suppl (FIFTY50 GLUCOSE METER 2.0) w/Device KIT See admin instructions.     carvedilol (COREG) 6.25 MG tablet Take 1 tablet (6.25 mg total) by mouth 2 (two) times daily with a meal. 60 tablet 6   ferrous sulfate 325 (65 FE) MG tablet Take 325 mg by mouth 2 (two) times daily with a meal.      glipiZIDE (GLUCOTROL XL) 5 MG 24 hr tablet Take 5 mg by mouth daily with breakfast.      hydrALAZINE (APRESOLINE) 25 MG tablet Take 1 tablet (25 mg total) by mouth 3 (three) times daily. 90 tablet 3   isosorbide mononitrate (IMDUR) 60 MG 24 hr tablet Take 1 tablet (60 mg total) by mouth  daily. Needs appointment for further refill 90 tablet 0   Menthol-Methyl Salicylate (MUSCLE RUB) 10-15 % CREA Apply 1 application topically as needed for muscle pain (knees).      metolazone (ZAROXOLYN) 2.5 MG tablet Take 1 tablet (2.5 mg total) by mouth daily. 2 tablet 0   Multiple Vitamin (MULTIVITAMIN WITH MINERALS) TABS tablet Take 2 tablets by mouth daily. Alive gummy bears     OXYGEN Inhale 2 L into the lungs at bedtime. As needed     polyethylene glycol powder (GLYCOLAX/MIRALAX) 17 GM/SCOOP powder Take 1 Container by mouth daily as needed.     potassium chloride SA (KLOR-CON) 20 MEQ tablet Take 20 mEq by mouth 2 (two) times daily.     torsemide (DEMADEX) 20 MG tablet Take 80 mg twice a day with extra 20 mg as needed for 3 pound weight gain. 270 tablet 11   No current facility-administered medications for this encounter.   BP 140/70   Pulse 71   Wt 106 kg (233 lb 9.6 oz)   SpO2 90%   BMI 44.14 kg/m   Wt Readings from Last 3 Encounters:  09/12/21 106 kg (233 lb 9.6 oz)  08/15/21 102.6 kg (226 lb 3.2 oz)  08/01/21 103.1 kg (227  lb 3.2 oz)   PHYSICAL EXAM: General:  NAD. No resp difficulty, arrived in [facility], required O2 HEENT: Normal Neck: Supple. JVP to ear. Carotids 2+ bilat; no bruits. No lymphadenopathy or thryomegaly appreciated. Cor: PMI nondisplaced. Regular rate & rhythm. No rubs, gallops or murmurs. Lungs: Diminished in bases. Abdomen: Obese, nontender, + distended. No hepatosplenomegaly. No bruits or masses. Good bowel sounds. Extremities: No cyanosis, clubbing, rash, 2-3+ BLE edema, no open areas Neuro: Alert & oriented x 3, cranial nerves grossly intact. Moves all 4 extremities w/o difficulty. Affect pleasant.  ASSESSMENT & PLAN: 1.  Acute on chronic systolic CHF: Prominent signs of RV failure.  9/21 echo showed EF 40-45%, global hypokinesis, D-shaped IV septum suggestive of RV pressure/volume overload, RV poorly visualized but appeared normal in size with at least mild systolic dysfunction. Cause of cardiomyopathy is uncertain.  She has history of COVID-19 1/21, cannot rule out viral myocarditis. Chemotherapy-mediated CMP is possible (not sure what chemo she had remotely for breast cancer).  Cardiac MRI showed LV EF 41% with diffuse hypokinesis, RV mildly dilated but with EF 27% and D-shaped septum, nonspecific RV insertion site LGE.  Suspect RV > LV failure. Cardiac MRI is not suggestive of amyloidosis, no M-spike on myeloma panel. We have not ruled out CAD (cath deferred given lack of ischemic CP and CKD). She has had recurrent HF admissions this year.  Milrinone started primarily for RV support last admit 9/21 and titrated off. She had cardiomems placed 1/22. Today, NYHA class IIIb symptoms, functional capacity limited by body habitus and knee pain.  She is volume overloaded on exam and by Cardiomems of 32 (goal 21).  - Will give lasix 80 mg IV +  metolazone 2.5 mg + 40 KCl today in clinic. BMET/BNP today, repeat in 1 week. - Tomorrow, resume torsemide 80 mg bid. She may ultimately require weekly metolazone,  however I have been holding off with her worsening renal function.  - Needs to wear compression hose. - Continue Coreg 6.25 mg bid.  - Continue hydralazine 25 mg tid + Imdur 60 mg daily. - Off SGLT2i  with worsening renal function. - No ARNI/spironolactone for now with elevated creatinine.  2. Chronic hypoxemic respiratory failure: Suspect OHS/OSA.  She  was using home oxygen prior to COVID-19 PNA.  - Continue supplemental oxygen at night. - We assisted her with her home sleep study application on her tablet, she plans on competing study soon. 3. Atrial fibrillation: Chronic, all ECGs since 7/21 show atrial fibrillation.  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 Rate is controlled.  - She cannot be cardioverted at this point as she cannot be chronically anticoagulated. - Evaluated by Dr. Quentin Ore for [place] but not a suitable candidate given multiple severe medical comorbidities and risks associated with general anesthesia. 4. GI bleeding: Recurrent, from small bowel AVMs.  - Unable to be anticoagulated. No abnormal bleeding. 5. Right pleural effusion: Suspect due to CHF.  Has had thoracentesis.  6. H/o DVT/PE: Provoked (post-surgery). No longer on anticoagulation due to h/o significant GIB. Has IVC filter.  7. CKD: Stage IV. Baseline SCr ~2.2. Follow closely with diuresis. - Followed by Dr. Royce Macadamia. Has follow up in a couple weeks. - BMET today.  Followup 3-4 weeks with APP to reassess volume. Consider adding weekly metolazone to diuretic regimen, may need RHC soon if renal function continues to climb and she remains volume overloaded.  [place], FNP 09/12/2021

## 2021-09-11 ENCOUNTER — Telehealth (HOSPITAL_COMMUNITY): Payer: Self-pay | Admitting: Adult Health

## 2021-09-11 ENCOUNTER — Other Ambulatory Visit (HOSPITAL_COMMUNITY): Payer: Self-pay

## 2021-09-11 MED ORDER — HYDRALAZINE HCL 25 MG PO TABS: 25.0000 mg | ORAL_TABLET | Freq: Three times a day (TID) | ORAL | 3 refills | Status: DC

## 2021-09-11 NOTE — Telephone Encounter (Signed)
Attempted to call regarding elevated cardiomems reading.   No answer. She has an appointment tomorrow. Will need BMET and Reds Clip.   Ennio Houp NP-C  3:30 PM

## 2021-09-12 ENCOUNTER — Other Ambulatory Visit (HOSPITAL_COMMUNITY): Payer: Self-pay | Admitting: Family Medicine

## 2021-09-12 ENCOUNTER — Encounter (HOSPITAL_COMMUNITY): Payer: Self-pay

## 2021-09-12 ENCOUNTER — Other Ambulatory Visit: Payer: Self-pay

## 2021-09-12 ENCOUNTER — Ambulatory Visit (HOSPITAL_COMMUNITY)
Admission: RE | Admit: 2021-09-12 | Discharge: 2021-09-12 | Disposition: A | Discharge status: Home / Self Care | Payer: PPO | Source: Ambulatory Visit | Attending: Family Medicine | Admitting: Family Medicine

## 2021-09-12 VITALS — BP 124/70 | HR 75 | Wt 233.6 lb

## 2021-09-12 DIAGNOSIS — Z7982 Long term (current) use of aspirin: Secondary | ICD-10-CM | POA: Diagnosis not present

## 2021-09-12 DIAGNOSIS — Z8616 Personal history of COVID-19: Secondary | ICD-10-CM | POA: Insufficient documentation

## 2021-09-12 DIAGNOSIS — J9611 Chronic respiratory failure with hypoxia: Secondary | ICD-10-CM | POA: Diagnosis not present

## 2021-09-12 DIAGNOSIS — I5021 Acute systolic (congestive) heart failure: Secondary | ICD-10-CM

## 2021-09-12 DIAGNOSIS — I5023 Acute on chronic systolic (congestive) heart failure: Secondary | ICD-10-CM | POA: Insufficient documentation

## 2021-09-12 DIAGNOSIS — E1122 Type 2 diabetes mellitus with diabetic chronic kidney disease: Secondary | ICD-10-CM | POA: Insufficient documentation

## 2021-09-12 DIAGNOSIS — Z7984 Long term (current) use of oral hypoglycemic drugs: Secondary | ICD-10-CM | POA: Diagnosis not present

## 2021-09-12 DIAGNOSIS — Z79899 Other long term (current) drug therapy: Secondary | ICD-10-CM | POA: Insufficient documentation

## 2021-09-12 DIAGNOSIS — I482 Chronic atrial fibrillation, unspecified: Secondary | ICD-10-CM | POA: Diagnosis not present

## 2021-09-12 DIAGNOSIS — I5043 Acute on chronic combined systolic (congestive) and diastolic (congestive) heart failure: Secondary | ICD-10-CM | POA: Diagnosis not present

## 2021-09-12 DIAGNOSIS — I130 Hypertensive heart and chronic kidney disease with heart failure and stage 1 through stage 4 chronic kidney disease, or unspecified chronic kidney disease: Secondary | ICD-10-CM | POA: Diagnosis not present

## 2021-09-12 DIAGNOSIS — Z8719 Personal history of other diseases of the digestive system: Secondary | ICD-10-CM

## 2021-09-12 DIAGNOSIS — I4821 Permanent atrial fibrillation: Secondary | ICD-10-CM | POA: Diagnosis not present

## 2021-09-12 DIAGNOSIS — Z8709 Personal history of other diseases of the respiratory system: Secondary | ICD-10-CM

## 2021-09-12 DIAGNOSIS — M25569 Pain in unspecified knee: Secondary | ICD-10-CM | POA: Insufficient documentation

## 2021-09-12 DIAGNOSIS — J90 Pleural effusion, not elsewhere classified: Secondary | ICD-10-CM | POA: Diagnosis not present

## 2021-09-12 DIAGNOSIS — Z86711 Personal history of pulmonary embolism: Secondary | ICD-10-CM | POA: Diagnosis not present

## 2021-09-12 DIAGNOSIS — N184 Chronic kidney disease, stage 4 (severe): Secondary | ICD-10-CM | POA: Insufficient documentation

## 2021-09-12 DIAGNOSIS — Z923 Personal history of irradiation: Secondary | ICD-10-CM | POA: Diagnosis not present

## 2021-09-12 DIAGNOSIS — Z86718 Personal history of other venous thrombosis and embolism: Secondary | ICD-10-CM | POA: Diagnosis not present

## 2021-09-12 LAB — BASIC METABOLIC PANEL
Anion gap: 11 (ref 5–15)
BUN: 51 mg/dL — ABNORMAL HIGH (ref 8–23)
CO2: 31 mmol/L (ref 22–32)
Calcium: 9.5 mg/dL (ref 8.9–10.3)
Chloride: 98 mmol/L (ref 98–111)
Creatinine, Ser: 2.48 mg/dL — ABNORMAL HIGH (ref 0.44–1.00)
GFR, Estimated: 21 mL/min — ABNORMAL LOW (ref >60)
Glucose, Bld: 133 mg/dL — ABNORMAL HIGH (ref 70–99)
Potassium: 4.4 mmol/L (ref 3.5–5.1)
Sodium: 140 mmol/L (ref 135–145)

## 2021-09-12 LAB — BRAIN NATRIURETIC PEPTIDE: B Natriuretic Peptide: 702.7 pg/mL — ABNORMAL HIGH (ref 0.0–100.0)

## 2021-09-12 MED ORDER — FUROSEMIDE 10 MG/ML IJ SOLN
80.0000 mg | Freq: Once | INTRAMUSCULAR | Status: AC
  Administered 2021-09-12: 80 mg via INTRAVENOUS

## 2021-09-12 MED ORDER — POTASSIUM CHLORIDE CRYS ER 20 MEQ PO TBCR
40.0000 meq | EXTENDED_RELEASE_TABLET | Freq: Once | ORAL | Status: AC
  Administered 2021-09-12: 40 meq via ORAL

## 2021-09-12 MED ORDER — METOLAZONE 2.5 MG PO TABS
2.5000 mg | ORAL_TABLET | Freq: Once | ORAL | Status: AC
  Administered 2021-09-12: 2.5 mg via ORAL

## 2021-09-12 NOTE — Patient Instructions (Addendum)
Do the following things EVERYDAY: Weigh yourself in the morning before breakfast. Write it down and keep it in a log. Take your medicines as prescribed Eat low salt foods--Limit salt (sodium) to 2000 mg per day.  Stay as active as you can everyday Limit all fluids for the day to less than 2 liters  Weigh yourself EVERY morning after you go to the bathroom but before you eat or drink anything. Write this number down in a weight log/diary. If you gain 3 pounds overnight or 5 pounds in a week, call the heart failure clinic  Limit your fluid intake to less than 2 liters of fluid per day. Fluid includes all drinks, coffee, juice, ice chips, soup, jello, and all other liquids.  Restrict your sodium intake to less than '2000mg'$  per day. This will help prevent your body from holding onto fluid. Read food labels as a lot of canned and packaged foods have a lot of sodium.  Your physician recommends that you schedule a follow-up appointment in: 3 weeks and again in 2-3 months  If you have any questions or concerns before your next appointment please send [place] a message through [place] or call our office at [phone].    TO LEAVE A MESSAGE FOR THE NURSE SELECT OPTION 2, PLEASE LEAVE A MESSAGE INCLUDING: YOUR NAME DATE OF BIRTH CALL BACK NUMBER REASON FOR CALL**this is important as we prioritize the call backs  YOU WILL RECEIVE A CALL BACK THE SAME DAY AS LONG AS YOU CALL BEFORE 4:00 PM  At the [facility], you and your health needs are our priority. As part of our continuing mission to provide you with exceptional heart care, we have created designated Provider Care Teams. These Care Teams include your primary Cardiologist (physician) and Advanced Practice Providers (APPs- Physician Assistants and Nurse Practitioners) who all work together to provide you with the care you need, when you need it.   You may see any of the following providers on your designated Care Team at your next  follow up: Dr Glori Bickers Dr Loralie Champagne Dr Patrice Paradise, NP Lyda Jester, [place] Ginnie Smart Audry Riles, PharmD   Please be sure to bring in all your medications bottles to every appointment.

## 2021-09-12 NOTE — Progress Notes (Signed)
ReDS Vest / Clip - 09/12/21 1000       ReDS Vest / Clip   Station Marker A    Ruler Value 34    ReDS Value Range Moderate volume overload    ReDS Actual Value 37

## 2021-09-12 NOTE — Progress Notes (Signed)
IV started per Tanda Rockers, RN and pt administered IV Lasix and PO KCL and metolazone, tolerated well and IV d/c'd with catheter intact. Pt has had 550 cc urine output, VS stable, and feels better. Per Allena Katz, FNP pt stable to d/c home. AVS reviewed with pt and f/u appts scheduled.

## 2021-09-17 DIAGNOSIS — I129 Hypertensive chronic kidney disease with stage 1 through stage 4 chronic kidney disease, or unspecified chronic kidney disease: Secondary | ICD-10-CM | POA: Diagnosis not present

## 2021-09-17 DIAGNOSIS — N2581 Secondary hyperparathyroidism of renal origin: Secondary | ICD-10-CM | POA: Diagnosis not present

## 2021-09-17 DIAGNOSIS — Z8719 Personal history of other diseases of the digestive system: Secondary | ICD-10-CM | POA: Diagnosis not present

## 2021-09-17 DIAGNOSIS — I4891 Unspecified atrial fibrillation: Secondary | ICD-10-CM | POA: Diagnosis not present

## 2021-09-17 DIAGNOSIS — E876 Hypokalemia: Secondary | ICD-10-CM | POA: Diagnosis not present

## 2021-09-17 DIAGNOSIS — N184 Chronic kidney disease, stage 4 (severe): Secondary | ICD-10-CM | POA: Diagnosis not present

## 2021-09-17 DIAGNOSIS — I5022 Chronic systolic (congestive) heart failure: Secondary | ICD-10-CM | POA: Diagnosis not present

## 2021-09-17 DIAGNOSIS — E1122 Type 2 diabetes mellitus with diabetic chronic kidney disease: Secondary | ICD-10-CM | POA: Diagnosis not present

## 2021-09-17 DIAGNOSIS — D631 Anemia in chronic kidney disease: Secondary | ICD-10-CM | POA: Diagnosis not present

## 2021-09-18 ENCOUNTER — Telehealth (HOSPITAL_COMMUNITY): Payer: Self-pay | Admitting: Adult Health

## 2021-09-18 MED ORDER — TORSEMIDE 20 MG PO TABS: 100.0000 mg | ORAL_TABLET | Freq: Two times a day (BID) | ORAL | 11 refills | Status: DC

## 2021-09-18 NOTE — Telephone Encounter (Signed)
Cardiomems Goal 21   Current reading 32.    She tells  she has been eating Tomato Soup.     Increase torsemide to 100 mg twice a day /   Discussed low salt food choices. She verbailzed understanding.   Indira Sorenson NP-C  3:54 PM

## 2021-09-20 DIAGNOSIS — H25811 Combined forms of age-related cataract, right eye: Secondary | ICD-10-CM | POA: Diagnosis not present

## 2021-09-24 ENCOUNTER — Telehealth: Payer: Self-pay

## 2021-09-24 DIAGNOSIS — R400 Somnolence: Secondary | ICD-10-CM

## 2021-09-24 NOTE — Telephone Encounter (Addendum)
Per office note on 09/12/21, pt still plans to have [place] sleep study. I have reordered due to pt's current order due to expire on 09/26/21. Ok'd per Allena Katz, FNP

## 2021-09-26 ENCOUNTER — Telehealth (HOSPITAL_COMMUNITY): Payer: Self-pay | Admitting: Surgery

## 2021-09-26 DIAGNOSIS — M1711 Unilateral primary osteoarthritis, right knee: Secondary | ICD-10-CM | POA: Diagnosis not present

## 2021-09-26 NOTE — Telephone Encounter (Signed)
I attempted to reach Rita [name] in reference to her ordered home sleep study.  The order is about to time out/cancel secondary to incompletion.  I was unable to reach her however left a message.

## 2021-10-01 DIAGNOSIS — Z961 Presence of intraocular lens: Secondary | ICD-10-CM | POA: Diagnosis not present

## 2021-10-01 DIAGNOSIS — E119 Type 2 diabetes mellitus without complications: Secondary | ICD-10-CM | POA: Diagnosis not present

## 2021-10-01 DIAGNOSIS — H25811 Combined forms of age-related cataract, right eye: Secondary | ICD-10-CM | POA: Diagnosis not present

## 2021-10-02 NOTE — Progress Notes (Incomplete)
° °ADVANCED HEART FAILURE CLINIC NOTE °PCP: Dr Grisso °Nephrology: Dr. Foster °HF Cardiology: Dr McLean  ° °HPI: °Ms Springer is a 65 y.o. 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  °  °She has had multiple hospitalizations in the last year.  She was admitted January 2021 for COVID-19 pneumonia w/ acute hypoxic respiratory failure.  She did not require intubation.  She was treated with steroids and remdesivir.  She was readmitted twice in 7/21 for CHF exacerbations complicated by pleural effusion requiring IR guided thoracentesis.  She was readmitted again 8/21 for recurrent CHF and diuresed with IV Lasix.  Echo 8/21 showed moderately reduced systolic function, EF 35 to 40% with mild concentric left ventricular hypertrophy and grade 2 diastolic dysfunction, mild hypokinesis of the LV apical segment, RV systolic function was normal.   ° °Readmitted recurrent CHF exacerbation plus AKI on CKD in 9/21.  Previous creatinine last admit was 1.76.  Creatinine on this admission elevated at 2.06. Chest x-ray demonstrated cardiomegaly and pulmonary venous congestion with recurrent right-sided pleural effusion.  BNP 673.  CBC showed chronic anemia, hemoglobin 8.9.  Also with worsening thrombocytopenia.  Platelets down to 84K. Echo repeated with EF 40-45%, RV ok, PASP 51 mmHg. Placed on milrinone to support RV for diuresis. Discharge weight 225 pounds. She was discharged on bidil but too expensive. Changed to Imdur + hydral. Cardiac MRI was done this admission, showed LV EF 41%, RV EF 27%,  D-shaped septum, RV insertion site LGE (RV>LV failure), no evidence for cardiac amyloidosis.  ° °She has seen EP for Watchman evaluation given inability to take anticoagulation.  Dr. Lambert thought that she is not a  candidate for left atrial appendage occlusion given the risks associated with general anesthesia. ° °She returned for followup of CHF 12/21.  Stable NYHA II symptoms, GDMT limited by rising renal function. She was still on supplemental oxygen. Home sleep study ordered and arranged for Cardiomems.  ° °S/p Cardiomems 1/22. ° °She was volume overloaded at follow up on 08/01/21, PAD was 29 (goal 21) and received 80 mg IV lasix in clinic and unna boots ordered via Home Health, however these were not done. She remained volume overloaded at next visit (08/15/21), Cardiomems 30, and instructed to take metolazone 2.5 x 2 doses, but 2nd dose held with worsening kidney function. ° °Today she returns for HF follow up with her husband. She feels bad today. Breathing "got more short yesterday."  Main complaint is her knee pain keeping her up at night. Denies CP, dizziness, edema, or PND/Orthopnea. She wears oxygen at night. She is not weighing at home. Taking all medications. Continues to eat salty foods. Has not been able to do sleep study. She never received Unna Boots after last visit, but did pick up her compression hose and said her swelling did get some better.  ° °Labs (11/21): K 4.5, creatinine 2.11 °Labs (5/22): K 4.1, creatinine 2.48 °Labs (7/22): K 4.8, creatinine 2.56 °Labs (8/22): K 4.1, creatinine 1.89 °Labs (9/22): K 4.2, creatinine 2.25 ° °ECG (personally reviewed): none ordered today. ° °Cardiomems (PAD goal 21): 32 on 09/10/21. ° °ReDs:   ReDs: 37%  PMH: 1. Chronic systolic CHF:  - Echo 9/76 EF 35-40%, mod HK, G2DD, RV normal - Echo 9/21 EF 40-45%, global hypokinesis, D-shaped septum, at least mildly decreased RV systolic function.  - Cardiac MRI (9/21): LV EF 41%, diffuse hypokinesis, D-shaped septum,  RV mildly dilated with RV EF 27%, nonspecific RV insertion site LGE.  No evidence for cardiac amyloidosis.  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  - RHC (10/21): mean RA 9, PA 54/17 mean 33, mean PCWP 20, CI 3.33, PVR 1.9 WU - Cardiomems placed 12/2020. 2. CKD stage IV 3. Type 2 diabetes 4. Pleural effusion s/p thoracentesis (due to CHF) 5. HTN 6. 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  7. COVID-19 infection 8. Atrial fibrillation: Chronic.  Not anticoagulated due to chronic anemia and GI bleeding.  - Not Watchman candidate per EP.  9. Anemia: Suspect due to chronic low grade GI bleeding (small bowel AVMs).  10. Suspect OSA/OHS: She is on home oxygen.  11. Left breast cancer: Treated remotely with chemo/radiation.  12. GERD 13. Chronic mild thrombocytopenia  ROS: All systems negative except as listed in HPI, PMH and Problem List.  Social History   Socioeconomic History   Marital status: Married    Spouse name: Not on file   Number of children: Not on file   Years of education: Not on file   Highest education level: Not on file  Occupational History   Not on file  Tobacco Use   Smoking status: Never   Smokeless tobacco: Never  Vaping Use   Vaping Use: Never used  Substance and Sexual Activity   Alcohol use: Never   Drug use: Never   Sexual activity: Not on file  Other Topics Concern   Not on file  Social History Narrative   Not on file   Social Determinants of Health   Financial Resource Strain: Not on file  Food Insecurity: Not on file  Transportation Needs: Not on file  Physical Activity: Not on file  Stress: Not on file  Social Connections: Not on file  Intimate Partner Violence: Not on file   Family History  Problem Relation Age of Onset   Cancer Mother    Current Outpatient Medications  Medication Sig Dispense Refill   acetaminophen (TYLENOL) 325 MG tablet Take 650 mg by mouth every 6 (six) hours as needed for mild pain or headache.     allopurinol (ZYLOPRIM) 100 MG tablet Take 100 mg by  mouth 2 (two) times daily.     blood glucose meter kit and supplies Dispense based on patient and insurance preference. Use up to four times daily as directed. (FOR ICD-10 E10.9, E11.9). 1 each 0   Blood Glucose Monitoring Suppl (FIFTY50 GLUCOSE METER 2.0) w/Device KIT See admin instructions.     carvedilol (COREG) 6.25 MG tablet Take 1 tablet (6.25 mg total) by mouth 2 (two) times daily with a meal. 60 tablet 6   ferrous sulfate 325 (65 FE) MG tablet Take 325 mg by mouth 2 (two) times daily with a meal.      glipiZIDE (GLUCOTROL XL) 5 MG 24 hr tablet Take 5 mg by mouth daily with breakfast.      hydrALAZINE (APRESOLINE) 25 MG tablet Take 1 tablet (25 mg total) by mouth 3 (three) times daily. 90 tablet 3   isosorbide mononitrate (IMDUR) 60 MG 24 hr tablet Take 1 tablet (60 mg total) by mouth daily. Needs appointment for further refill 90 tablet 0   Menthol-Methyl Salicylate (MUSCLE RUB) 10-15 %  CREA Apply 1 application topically as needed for muscle pain (knees).     ° metolazone (ZAROXOLYN) 2.5 MG tablet Take 1 tablet (2.5 mg total) by mouth daily. 2 tablet 0  ° Multiple Vitamin (MULTIVITAMIN WITH MINERALS) TABS tablet Take 2 tablets by mouth daily. Alive gummy bears    ° OXYGEN Inhale 2 L into the lungs at bedtime. As needed    ° polyethylene glycol powder (GLYCOLAX/MIRALAX) 17 GM/SCOOP powder Take 1 Container by mouth daily as needed.    ° potassium chloride SA (KLOR-CON) 20 MEQ tablet Take 20 mEq by mouth 2 (two) times daily.    ° torsemide (DEMADEX) 20 MG tablet Take 5 tablets (100 mg total) by mouth 2 (two) times daily. Take 80 mg twice a day with extra 20 mg as needed for 3 pound weight gain. 270 tablet 11  ° °No current facility-administered medications for this visit.  ° °There were no vitals taken for this visit. ° °Wt Readings from Last 3 Encounters:  °09/12/21 106 kg (233 lb 9.6 oz)  °08/15/21 102.6 kg (226 lb 3.2 oz)  °08/01/21 103.1 kg (227 lb 3.2 oz)  ° °PHYSICAL EXAM: °General:  NAD. No resp  difficulty, arrived in WC, required O2 °HEENT: Normal °Neck: Supple. JVP to ear. Carotids 2+ bilat; no bruits. No lymphadenopathy or thryomegaly appreciated. °Cor: PMI nondisplaced. Regular rate & rhythm. No rubs, gallops or murmurs. °Lungs: Diminished in bases. °Abdomen: Obese, nontender, + distended. No hepatosplenomegaly. No bruits or masses. Good bowel sounds. °Extremities: No cyanosis, clubbing, rash, 2-3+ BLE edema, no open areas °Neuro: Alert & oriented x 3, cranial nerves grossly intact. Moves all 4 extremities w/o difficulty. Affect pleasant. ° °ASSESSMENT & PLAN: °1.  Acute on chronic systolic CHF: Prominent signs of RV failure.  9/21 echo showed EF 40-45%, global hypokinesis, D-shaped IV septum suggestive of RV pressure/volume overload, RV poorly visualized but appeared normal in size with at least mild systolic dysfunction. Cause of cardiomyopathy is uncertain.  She has history of COVID-19 1/21, cannot rule out viral myocarditis. Chemotherapy-mediated CMP is possible (not sure what chemo she had remotely for breast cancer).  Cardiac MRI showed LV EF 41% with diffuse hypokinesis, RV mildly dilated but with EF 27% and D-shaped septum, nonspecific RV insertion site LGE.  Suspect RV > LV failure. Cardiac MRI is not suggestive of amyloidosis, no M-spike on myeloma panel. We have not ruled out CAD (cath deferred given lack of ischemic CP and CKD). She has had recurrent HF admissions this year.  Milrinone started primarily for RV support last admit 9/21 and titrated off. She had cardiomems placed 1/22. Today, NYHA class IIIb symptoms, functional capacity limited by body habitus and knee pain.  She is volume overloaded on exam and by Cardiomems of 32 (goal 21).  °- Will give lasix 80 mg IV +  metolazone 2.5 mg + 40 KCl today in clinic. BMET/BNP today, repeat in 1 week. °- Tomorrow, resume torsemide 80 mg bid. She may ultimately require weekly metolazone, however I have been holding off with her worsening renal  function.  °- Needs to wear compression hose. °- Continue Coreg 6.25 mg bid.  °- Continue hydralazine 25 mg tid + Imdur 60 mg daily. °- Off SGLT2i  with worsening renal function. °- No ARNI/spironolactone for now with elevated creatinine.  °2. Chronic hypoxemic respiratory failure: Suspect OHS/OSA.  She was using home oxygen prior to COVID-19 PNA.  °- Continue supplemental oxygen at night. °- We assisted her with her home   sleep study application on her tablet, she plans on competing study soon. °3. Atrial fibrillation: Chronic, all ECGs since 7/21 show atrial fibrillation.  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 Rate is controlled.  °- She cannot be cardioverted at this point as she cannot be chronically anticoagulated. °- Evaluated by Dr. Lambert for Watchman but not a suitable candidate given multiple severe medical comorbidities and risks associated with general anesthesia. °4. GI bleeding: Recurrent, from small bowel AVMs.  °- Unable to be anticoagulated. No abnormal bleeding. °5. Right pleural effusion: Suspect due to CHF.  Has had thoracentesis.  °6. H/o DVT/PE: Provoked (post-surgery). No longer on anticoagulation due to h/o significant GIB. Has IVC filter.  °7. CKD: Stage IV. Baseline SCr ~2.2. Follow closely with diuresis. °- Followed by Dr. Foster. Has follow up in a couple weeks. °- BMET today. ° °Followup 3-4 weeks with APP to reassess volume. Consider adding weekly metolazone to diuretic regimen, may need RHC soon if renal function continues to climb and she remains volume overloaded. ° °Jessica M Milford, FNP °10/02/2021 °

## 2021-10-03 ENCOUNTER — Inpatient Hospital Stay (HOSPITAL_COMMUNITY)
Admission: RE | Admit: 2021-10-03 | Discharge: 2021-10-03 | Disposition: A | Discharge status: Home / Self Care | Payer: PPO | Source: Ambulatory Visit

## 2021-10-03 DIAGNOSIS — M1712 Unilateral primary osteoarthritis, left knee: Secondary | ICD-10-CM | POA: Diagnosis not present

## 2021-10-10 ENCOUNTER — Other Ambulatory Visit: Payer: Self-pay

## 2021-10-10 ENCOUNTER — Ambulatory Visit (HOSPITAL_COMMUNITY)
Admission: RE | Admit: 2021-10-10 | Discharge: 2021-10-10 | Disposition: A | Discharge status: Home / Self Care | Payer: PPO | Source: Ambulatory Visit | Attending: Family Medicine | Admitting: Family Medicine

## 2021-10-10 ENCOUNTER — Encounter (HOSPITAL_COMMUNITY): Payer: Self-pay

## 2021-10-10 VITALS — BP 120/70 | HR 70 | Wt 225.8 lb

## 2021-10-10 DIAGNOSIS — E1122 Type 2 diabetes mellitus with diabetic chronic kidney disease: Secondary | ICD-10-CM | POA: Insufficient documentation

## 2021-10-10 DIAGNOSIS — Z923 Personal history of irradiation: Secondary | ICD-10-CM | POA: Insufficient documentation

## 2021-10-10 DIAGNOSIS — Z86718 Personal history of other venous thrombosis and embolism: Secondary | ICD-10-CM

## 2021-10-10 DIAGNOSIS — Z79899 Other long term (current) drug therapy: Secondary | ICD-10-CM | POA: Diagnosis not present

## 2021-10-10 DIAGNOSIS — J9611 Chronic respiratory failure with hypoxia: Secondary | ICD-10-CM | POA: Diagnosis not present

## 2021-10-10 DIAGNOSIS — I130 Hypertensive heart and chronic kidney disease with heart failure and stage 1 through stage 4 chronic kidney disease, or unspecified chronic kidney disease: Secondary | ICD-10-CM | POA: Diagnosis not present

## 2021-10-10 DIAGNOSIS — Z86711 Personal history of pulmonary embolism: Secondary | ICD-10-CM | POA: Insufficient documentation

## 2021-10-10 DIAGNOSIS — Z01818 Encounter for other preprocedural examination: Secondary | ICD-10-CM | POA: Diagnosis not present

## 2021-10-10 DIAGNOSIS — Z9889 Other specified postprocedural states: Secondary | ICD-10-CM | POA: Diagnosis not present

## 2021-10-10 DIAGNOSIS — Z8719 Personal history of other diseases of the digestive system: Secondary | ICD-10-CM | POA: Diagnosis not present

## 2021-10-10 DIAGNOSIS — Z8616 Personal history of COVID-19: Secondary | ICD-10-CM | POA: Insufficient documentation

## 2021-10-10 DIAGNOSIS — I5022 Chronic systolic (congestive) heart failure: Secondary | ICD-10-CM | POA: Insufficient documentation

## 2021-10-10 DIAGNOSIS — Z7984 Long term (current) use of oral hypoglycemic drugs: Secondary | ICD-10-CM | POA: Insufficient documentation

## 2021-10-10 DIAGNOSIS — I482 Chronic atrial fibrillation, unspecified: Secondary | ICD-10-CM | POA: Diagnosis not present

## 2021-10-10 DIAGNOSIS — I4821 Permanent atrial fibrillation: Secondary | ICD-10-CM

## 2021-10-10 DIAGNOSIS — J90 Pleural effusion, not elsewhere classified: Secondary | ICD-10-CM | POA: Diagnosis not present

## 2021-10-10 DIAGNOSIS — N184 Chronic kidney disease, stage 4 (severe): Secondary | ICD-10-CM | POA: Insufficient documentation

## 2021-10-10 DIAGNOSIS — Z8709 Personal history of other diseases of the respiratory system: Secondary | ICD-10-CM | POA: Diagnosis not present

## 2021-10-10 LAB — BASIC METABOLIC PANEL
Anion gap: 10 (ref 5–15)
BUN: 51 mg/dL — ABNORMAL HIGH (ref 8–23)
CO2: 34 mmol/L — ABNORMAL HIGH (ref 22–32)
Calcium: 9.4 mg/dL (ref 8.9–10.3)
Chloride: 93 mmol/L — ABNORMAL LOW (ref 98–111)
Creatinine, Ser: 2.13 mg/dL — ABNORMAL HIGH (ref 0.44–1.00)
GFR, Estimated: 25 mL/min — ABNORMAL LOW (ref >60)
Glucose, Bld: 261 mg/dL — ABNORMAL HIGH (ref 70–99)
Potassium: 4.2 mmol/L (ref 3.5–5.1)
Sodium: 137 mmol/L (ref 135–145)

## 2021-10-10 LAB — BRAIN NATRIURETIC PEPTIDE: B Natriuretic Peptide: 1090.4 pg/mL — ABNORMAL HIGH (ref 0.0–100.0)

## 2021-10-10 MED ORDER — TORSEMIDE 20 MG PO TABS: 100.0000 mg | ORAL_TABLET | Freq: Two times a day (BID) | ORAL | 11 refills | Status: DC

## 2021-10-10 NOTE — Progress Notes (Signed)
[facility] called back ,spoke to [place] T.She is aware the patient is cleared for surgery tomorrow as per Allena Katz NP.

## 2021-10-10 NOTE — Addendum Note (Signed)
Encounter addended by: Rafael Bihari, FNP on: 10/10/2021 4:57 PM  Actions taken: Clinical Note Signed

## 2021-10-10 NOTE — Progress Notes (Signed)
ReDS Vest / Clip - 10/10/21 1400       ReDS Vest / Clip   Station Marker A    Ruler Value 30    ReDS Value Range High volume overload    ReDS Actual Value 53

## 2021-10-10 NOTE — Progress Notes (Addendum)
ADVANCED HEART FAILURE CLINIC NOTE PCP: Dr Bea Graff Nephrology: Dr. Royce Macadamia HF Cardiology: Dr Aundra Dubin   HPI: Rita [name] is a 65 y.o. 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    She has had multiple hospitalizations in the last year.  She was admitted January 2021 for COVID-19 pneumonia w/ acute hypoxic respiratory failure.  She did not require intubation.  She was treated with steroids and remdesivir.  She was readmitted twice in 7/21 for CHF exacerbations complicated by pleural effusion requiring IR guided thoracentesis.  She was readmitted again 8/21 for recurrent CHF and diuresed with IV Lasix.  Echo 8/21 showed moderately reduced systolic function, EF 35 to 40% with mild concentric left ventricular hypertrophy and grade 2 diastolic dysfunction, mild hypokinesis of the LV apical segment, RV systolic function was normal.    Readmitted recurrent CHF exacerbation plus AKI on CKD in 9/21.  Previous creatinine last admit was 1.76.  Creatinine on this admission elevated at 2.06. Chest x-ray demonstrated cardiomegaly and pulmonary venous congestion with recurrent right-sided pleural effusion.  BNP 673.  CBC showed chronic anemia, hemoglobin 8.9.  Also with worsening thrombocytopenia.  Platelets down to 84K. Echo repeated with EF 40-45%, RV ok, PASP 51 mmHg. Placed on milrinone to support RV for diuresis. Discharge weight 225 pounds. She was discharged on bidil but too expensive. Changed to Imdur + hydral. Cardiac MRI was done this admission, showed LV EF 41%, RV EF 27%,  D-shaped septum, RV insertion site LGE (RV>LV failure), no evidence for cardiac amyloidosis.   She has seen EP for Watchman evaluation given inability to take anticoagulation.  Dr. Quentin Ore thought that she is not a candidate for left atrial appendage occlusion given the risks associated with general anesthesia.  She returned for followup of CHF 12/21.  Stable NYHA II symptoms, GDMT limited by rising renal function. She was still on supplemental oxygen. Home sleep study ordered and arranged for Cardiomems.   S/p Cardiomems 1/22.  She was volume overloaded at follow up on 08/01/21, PAD was 29 (goal 21) and received 80 mg IV lasix in clinic and unna boots ordered via [place], however these were not done. She remained volume overloaded at next visit (08/15/21), Cardiomems 30, and instructed to take metolazone 2.5 x 2 doses, but 2nd dose held with worsening kidney function.  Given Lasix 80 mg IV + metolazone 2.5 mg in clinic 09/12/21 for volume overload, Cardiomems 32, weight 233 lbs.  Today she returns for HF follow up with her husband. Breathing is "pretty good," but she feels her stomach feels tight. Her mobility is mainly limited by knee OA. She wears oxygen at night and during the day as needed. Overall feeling fine. Denies CP, dizziness, edema, or PND/Orthopnea. Appetite ok, she has been cutting down on her fluids and salty foods. No fever or chills. She is not weighing at home. Taking all medications. She has cataract surgery scheduled for tomorrow.  Labs (11/21): K 4.5, creatinine 2.11 Labs (5/22): K 4.1, creatinine 2.48 Labs (7/22): K 4.8, creatinine 2.56 Labs (8/22): K 4.1, creatinine 1.89 Labs (9/22): K 4.2, creatinine 2.25 Labs (10/22): K  4.4, creatinine 2.48  ECG (personally reviewed): chronic atrial fibrillation 80 bpm  Cardiomems (PAD goal 21): 26 today.  ReDs: 53%  PMH: 1. Chronic systolic CHF:  - Echo 7/74 EF 35-40%, mod HK, G2DD, RV normal - Echo 9/21 EF 40-45%, global  hypokinesis, D-shaped septum, at least mildly decreased RV systolic function.  - Cardiac MRI (9/21): LV EF 41%, diffuse hypokinesis, D-shaped septum, RV mildly dilated with RV EF 27%, nonspecific RV insertion site LGE.  No evidence for cardiac amyloidosis.  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  - RHC (10/21): mean RA 9, PA 54/17 mean 33, mean PCWP 20, CI 3.33, PVR 1.9 WU - Cardiomems placed 12/2020. 2. CKD stage IV 3. Type 2 diabetes 4. Pleural effusion s/p thoracentesis (due to CHF) 5. HTN 6. 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  7. COVID-19 infection 8. Atrial fibrillation: Chronic.  Not anticoagulated due to chronic anemia and GI bleeding.  - Not Watchman candidate per EP.  9. Anemia: Suspect due to chronic low grade GI bleeding (small bowel AVMs).  10. Suspect OSA/OHS: She is on home oxygen.  11. Left breast cancer: Treated remotely with chemo/radiation.  12. GERD 13. Chronic mild thrombocytopenia  ROS: All systems negative except as listed in HPI, PMH and Problem List.  Social History   Socioeconomic History   Marital status: Married    Spouse name: Not on file   Number of children: Not on file   Years of education: Not on file   Highest education level: Not on file  Occupational History   Not on file  Tobacco Use   Smoking status: Never   Smokeless tobacco: Never  Vaping Use   Vaping Use: Never used  Substance and Sexual Activity   Alcohol use: Never   Drug use: Never   Sexual activity: Not on file  Other Topics Concern   Not on file  Social History Narrative   Not on file   Social Determinants of Health   Financial Resource Strain: Not on file  Food Insecurity: Not on file  Transportation Needs: Not on file  Physical Activity: Not on file  Stress: Not on file  Social Connections: Not on file  Intimate Partner Violence: Not on file   Family History  Problem Relation Age of Onset   Cancer Mother    Current Outpatient Medications  Medication Sig Dispense Refill   acetaminophen  (TYLENOL) 325 MG tablet Take 650 mg by mouth every 6 (six) hours as needed for mild pain or headache.     allopurinol (ZYLOPRIM) 100 MG tablet Take 100 mg by mouth 2 (two) times daily.     blood glucose meter kit and supplies Dispense based on patient and insurance preference. Use up to four times daily as directed. (FOR ICD-10 E10.9, E11.9). 1 each 0   Blood Glucose Monitoring Suppl (FIFTY50 GLUCOSE METER 2.0) w/Device KIT See admin instructions.     carvedilol (COREG) 6.25 MG tablet Take 1 tablet (6.25 mg total) by mouth 2 (two) times daily with a meal. 60 tablet 6   ferrous sulfate 325 (65 FE) MG tablet Take 325 mg by mouth 2 (two) times daily with a meal.      glipiZIDE (GLUCOTROL XL) 5 MG 24 hr tablet Take 5 mg by mouth daily with breakfast.      hydrALAZINE (APRESOLINE) 25 MG tablet Take 1 tablet (25 mg total) by mouth 3 (three) times daily. 90 tablet 3   isosorbide mononitrate (IMDUR) 60 MG 24 hr tablet Take 1 tablet (60  mg total) by mouth daily. Needs appointment for further refill 90 tablet 0   Menthol-Methyl Salicylate (MUSCLE RUB) 10-15 % CREA Apply 1 application topically as needed for muscle pain (knees).      metolazone (ZAROXOLYN) 2.5 MG tablet Take 1 tablet (2.5 mg total) by mouth daily. 2 tablet 0   Multiple Vitamin (MULTIVITAMIN WITH MINERALS) TABS tablet Take 2 tablets by mouth daily. Alive gummy bears     OXYGEN Inhale 2 L into the lungs at bedtime. As needed     pantoprazole (PROTONIX) 40 MG tablet Take 40 mg by mouth daily.     polyethylene glycol (MIRALAX / GLYCOLAX) 17 g packet Take 17 g by mouth as needed.     potassium chloride SA (KLOR-CON) 20 MEQ tablet Take 20 mEq by mouth daily.     torsemide (DEMADEX) 20 MG tablet Take 5 tablets (100 mg total) by mouth 2 (two) times daily. Take 80 mg twice a day with extra 20 mg as needed for 3 pound weight gain. 270 tablet 11   No current facility-administered medications for this encounter.   BP 120/70   Pulse 70   Wt 102.4 kg  (225 lb 12.8 oz)   SpO2 90%   BMI 42.66 kg/m   Wt Readings from Last 3 Encounters:  10/10/21 102.4 kg (225 lb 12.8 oz)  09/12/21 106 kg (233 lb 9.6 oz)  08/15/21 102.6 kg (226 lb 3.2 oz)   PHYSICAL EXAM: ReDs: 53% General:  NAD. No resp difficulty, arrived in [facility] HEENT: Normal Neck: Supple. JVP to jaw. Carotids 2+ bilat; no bruits. No lymphadenopathy or thryomegaly appreciated. Cor: PMI nondisplaced. Irregular rate & rhythm. No rubs, gallops or murmurs. Lungs: Diminished in bases. Abdomen: Obese, nontender, nondistended. No hepatosplenomegaly. No bruits or masses. Good bowel sounds. Extremities: No cyanosis, clubbing, rash, 1+ BLE edema, compression hose on Neuro: Alert & oriented x 3, cranial nerves grossly intact. Moves all 4 extremities w/o difficulty. Affect pleasant.  ASSESSMENT & PLAN: 1.  Chronic systolic CHF: Prominent signs of RV failure.  9/21 echo showed EF 40-45%, global hypokinesis, D-shaped IV septum suggestive of RV pressure/volume overload, RV poorly visualized but appeared normal in size with at least mild systolic dysfunction. Cause of cardiomyopathy is uncertain.  She has history of COVID-19 1/21, cannot rule out viral myocarditis. Chemotherapy-mediated CMP is possible (not sure what chemo she had remotely for breast cancer).  Cardiac MRI showed LV EF 41% with diffuse hypokinesis, RV mildly dilated but with EF 27% and D-shaped septum, nonspecific RV insertion site LGE.  Suspect RV > LV failure. Cardiac MRI is not suggestive of amyloidosis, no M-spike on myeloma panel. We have not ruled out CAD (cath deferred given lack of ischemic CP and CKD). She has had recurrent HF admissions this year.  Milrinone started primarily for RV support last admit 9/21 and titrated off. She had cardiomems placed 1/22. Today, NYHA class IIIb symptoms, functional capacity limited by body habitus and knee pain.  Volume looks better today, weight down 8 lbs, but she is still mildly volume up on exam.  Cardiomems 26 today (goal 21).  - Increase torsemide to 100 mg bid. BMET today, repeat in 10 days. May ultimately require weekly metolazone. Will follow Cardiomems. - Continue Coreg 6.25 mg bid.  - Continue hydralazine 25 mg tid + Imdur 60 mg daily. - Off SGLT2i  with worsening renal function. - No ARNI/spironolactone for now with elevated creatinine.  - Continue compression hose. 2. Chronic hypoxemic respiratory failure:  Suspect OHS/OSA.  She was using home oxygen prior to COVID-19 PNA.  - Continue supplemental oxygen at night. - We have assisted her with her home sleep study application on her tablet, she plans on competing study soon. 3. Atrial fibrillation: Chronic, all ECGs since 7/21 show atrial fibrillation.  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 Rate is controlled.  - She cannot be cardioverted at this point as she cannot be chronically anticoagulated. - Evaluated by Dr. Quentin Ore for [place] but not a suitable candidate given multiple severe medical comorbidities and risks associated with general anesthesia. 4. GI bleeding: Recurrent, from small bowel AVMs.  - Unable to be anticoagulated. No abnormal bleeding. 5. Right pleural effusion: Suspect due to CHF.  Has had thoracentesis.  6. H/o DVT/PE: Provoked (post-surgery). No longer on anticoagulation due to h/o significant GIB. Has IVC filter.  7. CKD: Stage IV. Baseline SCr ~2.2. Follow closely with diuresis. - Followed by Dr. Royce Macadamia.  - BMET today. 8. Pre-operative cardiac clearance for cataract surgery:  Spoke with Caryl Pina T at eye surgery center and confirmed that she will receive a small dose of IV midazolam for procedure. ECG today stable and non-ischemic, she denies CP or worsening dyspnea, volume better but remains mildly elevated. She is at an acceptable cardiac risk for her cataract surgery.  Followup with Dr. Aundra Dubin next month as scheduled.  [place], FNP 10/10/2021

## 2021-10-10 NOTE — Patient Instructions (Signed)
EKG was done today  Labs were done today, if any labs are abnormal the clinic will call you  INCREASE Torsemide to 100 mg 5 tablets twice daily  Your physician recommends that you return for lab work in: in 10 days this has been scheduled for you  Your physician recommends that you schedule a follow-up appointment in: please keep scheduled appointment  At the [facility], you and your health needs are our priority. As part of our continuing mission to provide you with exceptional heart care, we have created designated Provider Care Teams. These Care Teams include your primary Cardiologist (physician) and Advanced Practice Providers (APPs- Physician Assistants and Nurse Practitioners) who all work together to provide you with the care you need, when you need it.   You may see any of the following providers on your designated Care Team at your next follow up: Dr Glori Bickers Dr Haynes Kerns, NP Lyda Jester, [place] [facility] [place], [place] Audry Riles, PharmD   Please be sure to bring in all your medications bottles to every appointment.    If you have any questions or concerns before your next appointment please send [place] a message through [place] or call our office at [phone].    TO LEAVE A MESSAGE FOR THE NURSE SELECT OPTION 2, PLEASE LEAVE A MESSAGE INCLUDING: YOUR NAME DATE OF BIRTH CALL BACK NUMBER REASON FOR CALL**this is important as we prioritize the call backs  YOU WILL RECEIVE A CALL BACK THE SAME DAY AS LONG AS YOU CALL BEFORE 4:00 PM

## 2021-10-11 DIAGNOSIS — H25811 Combined forms of age-related cataract, right eye: Secondary | ICD-10-CM | POA: Diagnosis not present

## 2021-10-22 ENCOUNTER — Ambulatory Visit (HOSPITAL_COMMUNITY)
Admission: RE | Admit: 2021-10-22 | Discharge: 2021-10-22 | Disposition: A | Discharge status: Home / Self Care | Payer: PPO | Source: Ambulatory Visit | Attending: Internal Medicine | Admitting: Internal Medicine

## 2021-10-22 ENCOUNTER — Other Ambulatory Visit: Payer: Self-pay

## 2021-10-22 DIAGNOSIS — I5022 Chronic systolic (congestive) heart failure: Secondary | ICD-10-CM

## 2021-10-22 LAB — BASIC METABOLIC PANEL
Anion gap: 12 (ref 5–15)
BUN: 46 mg/dL — ABNORMAL HIGH (ref 8–23)
CO2: 33 mmol/L — ABNORMAL HIGH (ref 22–32)
Calcium: 9.3 mg/dL (ref 8.9–10.3)
Chloride: 95 mmol/L — ABNORMAL LOW (ref 98–111)
Creatinine, Ser: 2.12 mg/dL — ABNORMAL HIGH (ref 0.44–1.00)
GFR, Estimated: 25 mL/min — ABNORMAL LOW (ref >60)
Glucose, Bld: 233 mg/dL — ABNORMAL HIGH (ref 70–99)
Potassium: 4.1 mmol/L (ref 3.5–5.1)
Sodium: 140 mmol/L (ref 135–145)

## 2021-10-28 DIAGNOSIS — S3993XA Unspecified injury of pelvis, initial encounter: Secondary | ICD-10-CM | POA: Diagnosis not present

## 2021-10-28 DIAGNOSIS — J90 Pleural effusion, not elsewhere classified: Secondary | ICD-10-CM | POA: Diagnosis not present

## 2021-10-28 DIAGNOSIS — N179 Acute kidney failure, unspecified: Secondary | ICD-10-CM | POA: Diagnosis not present

## 2021-10-28 DIAGNOSIS — M47816 Spondylosis without myelopathy or radiculopathy, lumbar region: Secondary | ICD-10-CM | POA: Diagnosis not present

## 2021-10-28 DIAGNOSIS — I130 Hypertensive heart and chronic kidney disease with heart failure and stage 1 through stage 4 chronic kidney disease, or unspecified chronic kidney disease: Secondary | ICD-10-CM | POA: Diagnosis not present

## 2021-10-28 DIAGNOSIS — E161 Other hypoglycemia: Secondary | ICD-10-CM | POA: Diagnosis not present

## 2021-10-28 DIAGNOSIS — E162 Hypoglycemia, unspecified: Secondary | ICD-10-CM | POA: Diagnosis not present

## 2021-10-28 DIAGNOSIS — I5033 Acute on chronic diastolic (congestive) heart failure: Secondary | ICD-10-CM | POA: Diagnosis not present

## 2021-10-28 DIAGNOSIS — I10 Essential (primary) hypertension: Secondary | ICD-10-CM | POA: Diagnosis not present

## 2021-10-28 DIAGNOSIS — I110 Hypertensive heart disease with heart failure: Secondary | ICD-10-CM | POA: Diagnosis not present

## 2021-10-28 DIAGNOSIS — I509 Heart failure, unspecified: Secondary | ICD-10-CM | POA: Diagnosis not present

## 2021-10-28 DIAGNOSIS — R0902 Hypoxemia: Secondary | ICD-10-CM | POA: Diagnosis not present

## 2021-10-28 DIAGNOSIS — R531 Weakness: Secondary | ICD-10-CM | POA: Diagnosis not present

## 2021-10-29 DIAGNOSIS — N179 Acute kidney failure, unspecified: Secondary | ICD-10-CM | POA: Diagnosis not present

## 2021-10-29 DIAGNOSIS — I130 Hypertensive heart and chronic kidney disease with heart failure and stage 1 through stage 4 chronic kidney disease, or unspecified chronic kidney disease: Secondary | ICD-10-CM | POA: Diagnosis not present

## 2021-10-29 DIAGNOSIS — I5033 Acute on chronic diastolic (congestive) heart failure: Secondary | ICD-10-CM | POA: Diagnosis not present

## 2021-10-30 DIAGNOSIS — I130 Hypertensive heart and chronic kidney disease with heart failure and stage 1 through stage 4 chronic kidney disease, or unspecified chronic kidney disease: Secondary | ICD-10-CM | POA: Diagnosis not present

## 2021-10-30 DIAGNOSIS — N183 Chronic kidney disease, stage 3 unspecified: Secondary | ICD-10-CM | POA: Diagnosis not present

## 2021-10-30 DIAGNOSIS — Z86711 Personal history of pulmonary embolism: Secondary | ICD-10-CM | POA: Diagnosis not present

## 2021-10-30 DIAGNOSIS — E162 Hypoglycemia, unspecified: Secondary | ICD-10-CM | POA: Diagnosis not present

## 2021-10-30 DIAGNOSIS — J9610 Chronic respiratory failure, unspecified whether with hypoxia or hypercapnia: Secondary | ICD-10-CM | POA: Diagnosis not present

## 2021-10-30 DIAGNOSIS — Z888 Allergy status to other drugs, medicaments and biological substances status: Secondary | ICD-10-CM | POA: Diagnosis not present

## 2021-10-30 DIAGNOSIS — I4891 Unspecified atrial fibrillation: Secondary | ICD-10-CM | POA: Diagnosis not present

## 2021-10-30 DIAGNOSIS — D631 Anemia in chronic kidney disease: Secondary | ICD-10-CM | POA: Diagnosis not present

## 2021-10-30 DIAGNOSIS — I5033 Acute on chronic diastolic (congestive) heart failure: Secondary | ICD-10-CM | POA: Diagnosis not present

## 2021-10-30 DIAGNOSIS — E1122 Type 2 diabetes mellitus with diabetic chronic kidney disease: Secondary | ICD-10-CM | POA: Diagnosis not present

## 2021-10-30 DIAGNOSIS — K219 Gastro-esophageal reflux disease without esophagitis: Secondary | ICD-10-CM | POA: Diagnosis not present

## 2021-10-30 DIAGNOSIS — Z7984 Long term (current) use of oral hypoglycemic drugs: Secondary | ICD-10-CM | POA: Diagnosis not present

## 2021-10-30 DIAGNOSIS — Z885 Allergy status to narcotic agent status: Secondary | ICD-10-CM | POA: Diagnosis not present

## 2021-10-30 DIAGNOSIS — Z23 Encounter for immunization: Secondary | ICD-10-CM | POA: Diagnosis not present

## 2021-10-30 DIAGNOSIS — Z881 Allergy status to other antibiotic agents status: Secondary | ICD-10-CM | POA: Diagnosis not present

## 2021-10-30 DIAGNOSIS — Z853 Personal history of malignant neoplasm of breast: Secondary | ICD-10-CM | POA: Diagnosis not present

## 2021-10-30 DIAGNOSIS — Z9981 Dependence on supplemental oxygen: Secondary | ICD-10-CM | POA: Diagnosis not present

## 2021-10-30 DIAGNOSIS — I272 Pulmonary hypertension, unspecified: Secondary | ICD-10-CM | POA: Diagnosis not present

## 2021-10-30 DIAGNOSIS — N179 Acute kidney failure, unspecified: Secondary | ICD-10-CM | POA: Diagnosis not present

## 2021-10-30 DIAGNOSIS — M1990 Unspecified osteoarthritis, unspecified site: Secondary | ICD-10-CM | POA: Diagnosis not present

## 2021-10-30 DIAGNOSIS — J449 Chronic obstructive pulmonary disease, unspecified: Secondary | ICD-10-CM | POA: Diagnosis not present

## 2021-10-30 DIAGNOSIS — Z7982 Long term (current) use of aspirin: Secondary | ICD-10-CM | POA: Diagnosis not present

## 2021-11-05 DIAGNOSIS — I5033 Acute on chronic diastolic (congestive) heart failure: Secondary | ICD-10-CM | POA: Diagnosis not present

## 2021-11-12 DIAGNOSIS — D500 Iron deficiency anemia secondary to blood loss (chronic): Secondary | ICD-10-CM | POA: Diagnosis not present

## 2021-11-12 DIAGNOSIS — I272 Pulmonary hypertension, unspecified: Secondary | ICD-10-CM | POA: Diagnosis not present

## 2021-11-12 DIAGNOSIS — N1832 Chronic kidney disease, stage 3b: Secondary | ICD-10-CM | POA: Diagnosis not present

## 2021-11-12 DIAGNOSIS — N390 Urinary tract infection, site not specified: Secondary | ICD-10-CM | POA: Diagnosis not present

## 2021-11-12 DIAGNOSIS — I4819 Other persistent atrial fibrillation: Secondary | ICD-10-CM | POA: Diagnosis not present

## 2021-11-12 DIAGNOSIS — I5042 Chronic combined systolic (congestive) and diastolic (congestive) heart failure: Secondary | ICD-10-CM | POA: Diagnosis not present

## 2021-11-16 ENCOUNTER — Ambulatory Visit (HOSPITAL_COMMUNITY)
Admission: RE | Admit: 2021-11-16 | Discharge: 2021-11-16 | Disposition: A | Discharge status: Home / Self Care | Payer: PPO | Source: Ambulatory Visit | Attending: Cardiology | Admitting: Cardiology

## 2021-11-16 ENCOUNTER — Encounter (HOSPITAL_COMMUNITY): Payer: Self-pay | Admitting: Cardiology

## 2021-11-16 ENCOUNTER — Other Ambulatory Visit: Payer: Self-pay

## 2021-11-16 VITALS — BP 132/62 | HR 72 | Wt 218.8 lb

## 2021-11-16 DIAGNOSIS — I4821 Permanent atrial fibrillation: Secondary | ICD-10-CM

## 2021-11-16 DIAGNOSIS — Z79899 Other long term (current) drug therapy: Secondary | ICD-10-CM | POA: Insufficient documentation

## 2021-11-16 DIAGNOSIS — I5022 Chronic systolic (congestive) heart failure: Secondary | ICD-10-CM

## 2021-11-16 DIAGNOSIS — Z86711 Personal history of pulmonary embolism: Secondary | ICD-10-CM | POA: Insufficient documentation

## 2021-11-16 DIAGNOSIS — N184 Chronic kidney disease, stage 4 (severe): Secondary | ICD-10-CM | POA: Insufficient documentation

## 2021-11-16 DIAGNOSIS — E1122 Type 2 diabetes mellitus with diabetic chronic kidney disease: Secondary | ICD-10-CM | POA: Diagnosis not present

## 2021-11-16 DIAGNOSIS — Z7984 Long term (current) use of oral hypoglycemic drugs: Secondary | ICD-10-CM | POA: Insufficient documentation

## 2021-11-16 DIAGNOSIS — J9611 Chronic respiratory failure with hypoxia: Secondary | ICD-10-CM | POA: Insufficient documentation

## 2021-11-16 DIAGNOSIS — Z86718 Personal history of other venous thrombosis and embolism: Secondary | ICD-10-CM | POA: Diagnosis not present

## 2021-11-16 DIAGNOSIS — Z6841 Body Mass Index (BMI) 40.0 and over, adult: Secondary | ICD-10-CM | POA: Diagnosis not present

## 2021-11-16 DIAGNOSIS — I130 Hypertensive heart and chronic kidney disease with heart failure and stage 1 through stage 4 chronic kidney disease, or unspecified chronic kidney disease: Secondary | ICD-10-CM | POA: Diagnosis not present

## 2021-11-16 DIAGNOSIS — Z8616 Personal history of COVID-19: Secondary | ICD-10-CM | POA: Insufficient documentation

## 2021-11-16 DIAGNOSIS — E669 Obesity, unspecified: Secondary | ICD-10-CM | POA: Insufficient documentation

## 2021-11-16 DIAGNOSIS — I482 Chronic atrial fibrillation, unspecified: Secondary | ICD-10-CM | POA: Insufficient documentation

## 2021-11-16 LAB — BASIC METABOLIC PANEL
Anion gap: 10 (ref 5–15)
BUN: 35 mg/dL — ABNORMAL HIGH (ref 8–23)
CO2: 34 mmol/L — ABNORMAL HIGH (ref 22–32)
Calcium: 9.3 mg/dL (ref 8.9–10.3)
Chloride: 95 mmol/L — ABNORMAL LOW (ref 98–111)
Creatinine, Ser: 1.9 mg/dL — ABNORMAL HIGH (ref 0.44–1.00)
GFR, Estimated: 29 mL/min — ABNORMAL LOW (ref >60)
Glucose, Bld: 94 mg/dL (ref 70–99)
Potassium: 4.1 mmol/L (ref 3.5–5.1)
Sodium: 139 mmol/L (ref 135–145)

## 2021-11-16 LAB — CBC
HCT: 26.7 % — ABNORMAL LOW (ref 36.0–46.0)
Hemoglobin: 8.9 g/dL — ABNORMAL LOW (ref 12.0–15.0)
MCH: 28.5 pg (ref 26.0–34.0)
MCHC: 33.3 g/dL (ref 30.0–36.0)
MCV: 85.6 fL (ref 80.0–100.0)
Platelets: 95 10*3/uL — ABNORMAL LOW (ref 150–400)
RBC: 3.12 MIL/uL — ABNORMAL LOW (ref 3.87–5.11)
RDW: 17.1 % — ABNORMAL HIGH (ref 11.5–15.5)
WBC: 3.7 10*3/uL — ABNORMAL LOW (ref 4.0–10.5)
nRBC: 0 % (ref 0.0–0.2)

## 2021-11-16 LAB — BRAIN NATRIURETIC PEPTIDE: B Natriuretic Peptide: 857.4 pg/mL — ABNORMAL HIGH (ref 0.0–100.0)

## 2021-11-16 MED ORDER — POTASSIUM CHLORIDE CRYS ER 20 MEQ PO TBCR: 20.0000 meq | EXTENDED_RELEASE_TABLET | Freq: Every day | ORAL | Status: DC

## 2021-11-16 MED ORDER — HYDRALAZINE HCL 25 MG PO TABS: 37.5000 mg | ORAL_TABLET | Freq: Three times a day (TID) | ORAL | 3 refills | Status: DC

## 2021-11-16 MED ORDER — METOLAZONE 2.5 MG PO TABS: 2.5000 mg | ORAL_TABLET | ORAL | 3 refills | Status: DC

## 2021-11-16 NOTE — Patient Instructions (Signed)
Medication Changes:  --Start Metolazone 2.5 mg, take 1 tab Sat 12/10, Mon 12/12, then every Sat  --Take an extra 20 meq (1 tab) of Potassium when you take Metolazone --Increase Hydralazine to 37.5 mg (1 & 1/2 tabs) Three times a day   Lab Work:  --Done today, we will call you for abnormal results --Your physician recommends that you return for lab work in: 1-2 weeks, we have given you a prescription to have this done locally   Testing/Procedures:  Your physician has requested that you have an echocardiogram. Echocardiography is a painless test that uses sound waves to create images of your heart. It provides your doctor with information about the size and shape of your heart and how well your heart's chambers and valves are working. This procedure takes approximately one hour. There are no restrictions for this procedure.  Your provider has recommended that you have a home sleep study.  We have provided you with the equipment in our office today. Please download the app and follow the instructions. YOUR PIN NUMBER IS: [id number]. Once you have completed the test you just dispose of the equipment, the information is automatically uploaded to [place] via blue-tooth technology. If your test is positive for sleep apnea and you need a home CPAP machine you will be contacted by Dr Theodosia Blender office [facility]) to set this up.   Referrals:  None   Special Instructions // Education:  Do the following things EVERYDAY: Weigh yourself in the morning before breakfast. Write it down and keep it in a log. Take your medicines as prescribed Eat low salt foods--Limit salt (sodium) to 2000 mg per day.  Stay as active as you can everyday Limit all fluids for the day to less than 2 liters       6)  Do your Cardiomems reading everyday  Follow-Up in: 3 months  At the [facility], you and your health needs are our priority. We have a designated team specialized in the treatment of Heart  Failure. This Care Team includes your primary Heart Failure Specialized Cardiologist (physician), Advanced Practice Providers (APPs- Physician Assistants and Nurse Practitioners), and Pharmacist who all work together to provide you with the care you need, when you need it.   You may see any of the following providers on your designated Care Team at your next follow up:  Dr Glori Bickers Dr Haynes Kerns, NP Lyda Jester, [place] [facility] [place], [place] Audry Riles, PharmD   Please be sure to bring in all your medications bottles to every appointment.   Need to Contact us:  If you have any questions or concerns before your next appointment please send [place] a message through [place] or call our office at [phone].    TO LEAVE A MESSAGE FOR THE NURSE SELECT OPTION 2, PLEASE LEAVE A MESSAGE INCLUDING: YOUR NAME DATE OF BIRTH CALL BACK NUMBER REASON FOR CALL**this is important as we prioritize the call backs  YOU WILL RECEIVE A CALL BACK THE SAME DAY AS LONG AS YOU CALL BEFORE 4:00 PM

## 2021-11-18 NOTE — Progress Notes (Signed)
ADVANCED HEART FAILURE CLINIC NOTE PCP: Dr Shary Decamp Nephrology: Dr. Malen Gauze HF Cardiology: Dr Shirlee Latch   HPI: Rita [name] is a 65 y.o. 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    She has had multiple hospitalizations in the last year.  She was admitted January 2021 for COVID-19 pneumonia w/ acute hypoxic respiratory failure.  She did not require intubation.  She was treated with steroids and remdesivir.  She was readmitted twice in 7/21 for CHF exacerbations complicated by pleural effusion requiring IR guided thoracentesis.  She was readmitted again 8/21 for recurrent CHF and diuresed with IV Lasix.  Echo 8/21 showed moderately reduced systolic function, EF 35 to 40% with mild concentric left ventricular hypertrophy and grade 2 diastolic dysfunction, mild hypokinesis of the LV apical segment, RV systolic function was normal.    Readmitted recurrent CHF exacerbation plus AKI on CKD in 9/21.  Previous creatinine last admit was 1.76.  Creatinine on this admission elevated at 2.06. Chest x-ray demonstrated cardiomegaly and pulmonary venous congestion with recurrent right-sided pleural effusion.  BNP 673.  CBC showed chronic anemia, hemoglobin 8.9.  Also with worsening thrombocytopenia.  Platelets down to 84K. Echo repeated with EF 40-45%, RV ok, PASP 51 mmHg. Placed on milrinone to support RV for diuresis. Discharge weight 225 pounds. She was discharged on bidil but too expensive. Changed to Imdur + hydral. Cardiac MRI was done this admission, showed LV EF 41%, RV EF 27%,  D-shaped septum, RV insertion site LGE (RV>LV failure), no evidence for cardiac amyloidosis.   She has seen EP for Watchman evaluation given inability to take anticoagulation.  Dr. Lalla Brothers thought that she is not a candidate for left atrial appendage occlusion given the risks associated with general anesthesia.  S/p Cardiomems 1/22.  Patient returns for followup of CHF. She uses a walker or a cane, more limited by knee pain than dyspnea. Breathing is ok walking around the house but she does not get much further.   She sleeps with oxygen.  Still has not done her home sleep study. Weight down 7 lbs.  No chest pain.  No orthopnea/PND.    Labs (11/21): K 4.5, creatinine 2.11 Labs (5/22): K 4.1, creatinine 2.48 Labs (7/22): K 4.8, creatinine 2.56 Labs (8/22): K 4.1, creatinine 1.89 Labs (9/22): K 4.2, creatinine 2.25 Labs (10/22): K 4.4, creatinine 2.48 Labs (11/22): K 4.1, creatinine 2.12  ECG (personally reviewed): chronic atrial fibrillation, right axis deviation, IVCD 118 msec  Cardiomems (PAD goal 21): 25 today.  PMH: 1. Chronic systolic CHF:  - Echo 8/21 EF 37-12%, mod HK, G2DD, RV normal - Echo 9/21 EF 40-45%, global hypokinesis, D-shaped septum, at least mildly decreased RV systolic function.  - Cardiac MRI (9/21): LV EF 41%, diffuse hypokinesis, D-shaped septum, RV mildly dilated with RV EF 27%, nonspecific RV insertion site LGE.  No evidence for cardiac amyloidosis.  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  - RHC (10/21): mean RA 9, PA 54/17 mean 33, mean PCWP 20, CI 3.33, PVR 1.9 WU - Cardiomems placed 12/2020.  RHC (1/22) with mean RA 14, PA 60/22, mean PCWP 20, CI 3.06, PVR 3.2.  2. CKD  stage IV 3. Type 2 diabetes 4. Pleural effusion s/p thoracentesis (due to CHF) 5. HTN 6. 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  7. COVID-19 infection 8. Atrial fibrillation: Chronic.  Not anticoagulated due to chronic anemia and GI bleeding.  - Not Watchman candidate per EP.  9. Anemia: Suspect due to chronic low grade GI  bleeding (small bowel AVMs).  10. Suspect OSA/OHS: She is on home oxygen.  11. Left breast cancer: Treated remotely with chemo/radiation.  12. GERD 13. Chronic mild thrombocytopenia  ROS: All systems negative except as listed in HPI, PMH and Problem List.  Social History   Socioeconomic History   Marital status: Married    Spouse name: Not on file   Number of children: Not on file   Years of education: Not on file   Highest education level: Not on file  Occupational History   Not on file  Tobacco Use   Smoking status: Never   Smokeless tobacco: Never  Vaping Use   Vaping Use: Never used  Substance and Sexual Activity   Alcohol use: Never   Drug use: Never   Sexual activity: Not on file  Other Topics Concern   Not on file  Social History Narrative   Not on file   Social Determinants of Health   Financial Resource Strain: Not on file  Food Insecurity: Not on file  Transportation Needs: Not on file  Physical Activity: Not on file  Stress: Not on file  Social Connections: Not on file  Intimate Partner Violence: Not on file   Family History  Problem Relation Age of Onset   Cancer Mother    Current Outpatient Medications  Medication Sig Dispense Refill   acetaminophen (TYLENOL) 325 MG tablet Take 650 mg by mouth every 6 (six) hours as needed for mild pain or headache.     allopurinol (ZYLOPRIM) 100 MG tablet Take 100 mg by mouth 2 (two) times daily.     aspirin EC 81 MG tablet Take 81 mg by mouth daily. Swallow whole.     blood glucose meter kit and supplies Dispense based on patient and insurance preference. Use up to four times daily as directed. (FOR ICD-10 E10.9, E11.9). 1 each 0   Blood Glucose Monitoring Suppl (FIFTY50 GLUCOSE METER 2.0) w/Device KIT See admin instructions.     carvedilol (COREG) 6.25 MG tablet Take 1 tablet (6.25 mg total) by mouth 2 (two) times daily with a meal. 60 tablet 6   ferrous sulfate 325 (65 FE) MG tablet Take 325 mg by mouth 2 (two)  times daily with a meal.      glipiZIDE (GLUCOTROL XL) 5 MG 24 hr tablet Take 5 mg by mouth daily with breakfast.      isosorbide mononitrate (IMDUR) 60 MG 24 hr tablet Take 1 tablet (60 mg total) by mouth daily. Needs appointment for further refill 90 tablet 0   Menthol-Methyl Salicylate (MUSCLE RUB) 10-15 % CREA Apply 1 application topically as needed for muscle pain (knees).      metolazone (ZAROXOLYN) 2.5 MG tablet Take 1 tablet (2.5 mg total) by mouth once a week. On Saturdays 10 tablet 3   Multiple Vitamin (MULTIVITAMIN WITH MINERALS) TABS tablet Take 2 tablets by mouth daily. Alive gummy bears     Multiple Vitamins-Minerals (VITAMIN D3 COMPLETE PO) Take 25 mcg by mouth daily.     OXYGEN Inhale 2 L into the lungs at bedtime. As needed     pantoprazole (PROTONIX) 40 MG tablet Take 40  mg by mouth as needed.     polyethylene glycol (MIRALAX / GLYCOLAX) 17 g packet Take 17 g by mouth as needed.     torsemide (DEMADEX) 20 MG tablet Take 5 tablets (100 mg total) by mouth 2 (two) times daily. 270 tablet 11   hydrALAZINE (APRESOLINE) 25 MG tablet Take 1.5 tablets (37.5 mg total) by mouth 3 (three) times daily. 135 tablet 3   potassium chloride SA (KLOR-CON M) 20 MEQ tablet Take 1 tablet (20 mEq total) by mouth daily. Take extra tab when you take Metolazone     No current facility-administered medications for this encounter.   BP 132/62   Pulse 72   Wt 99.2 kg (218 lb 12.8 oz)   SpO2 92%   BMI 41.34 kg/m   Wt Readings from Last 3 Encounters:  11/16/21 99.2 kg (218 lb 12.8 oz)  10/10/21 102.4 kg (225 lb 12.8 oz)  09/12/21 106 kg (233 lb 9.6 oz)   PHYSICAL EXAM: General: NAD Neck: No JVD, no thyromegaly or thyroid nodule.  Lungs: Clear to auscultation bilaterally with normal respiratory effort. CV: Nondisplaced PMI.  Heart irregular S1/S2, no S3/S4, 2/6 HSM apex.  1+ edema to knees.  No carotid bruit.  Normal pedal pulses.  Abdomen: Soft, nontender, no hepatosplenomegaly, no distention.   Skin: Intact without lesions or rashes.  Neurologic: Alert and oriented x 3.  Psych: Normal affect. Extremities: No clubbing or cyanosis.  HEENT: Normal.   ASSESSMENT & PLAN: 1.  Chronic systolic CHF: Prominent signs of RV failure.  9/21 echo showed EF 40-45%, global hypokinesis, D-shaped IV septum suggestive of RV pressure/volume overload, RV poorly visualized but appeared normal in size with at least mild systolic dysfunction. Cause of cardiomyopathy is uncertain.  She has history of COVID-19 1/21, cannot rule out viral myocarditis. Chemotherapy-mediated CMP is possible (not sure what chemo she had remotely for breast cancer).  Cardiac MRI showed LV EF 41% with diffuse hypokinesis, RV mildly dilated but with EF 27% and D-shaped septum, nonspecific RV insertion site LGE.  Suspect RV > LV failure. Cardiac MRI is not suggestive of amyloidosis, no M-spike on myeloma panel. We have not ruled out CAD (cath deferred given lack of ischemic CP and CKD). Milrinone started primarily for RV support during admission in 9/21 and titrated off. She had cardiomems placed 1/22. Today, NYHA class III symptoms, functional capacity limited by body habitus and knee pain.  She is volume overloaded by exam and Cardiomems though weight is down.   - Continue torsemide 100 mg bid.  - Add metolazone 2.5 mg on Saturday morning, then again Monday morning, then every Saturday after that.  She will take an extra 20 mEq KCl with metolazone.  BMET/BNP today and again in 10 days. - Continue Coreg 6.25 mg bid.  - Increase hydralazine to 37.5 mg tid and continue Imdur 60 mg daily. - If renal function remains stable, would start SGLT2 inhibitor next appt.  - No ARNI/spironolactone for now with elevated creatinine.  - She needs repeat echo. 2. Chronic hypoxemic respiratory failure: Suspect OHS/OSA.  She was using home oxygen prior to COVID-19 PNA.  - Continue supplemental oxygen at night. - She has a home sleep study, needs to  complete it. 3. Atrial fibrillation: Chronic, all ECGs since 7/21 show atrial fibrillation.  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 Rate is controlled. No overt bleeding.  - She cannot be cardioverted at this point as she cannot  be chronically anticoagulated. - Evaluated by Dr. Quentin Ore for [place] but not a suitable candidate given multiple severe medical comorbidities and risks associated with general anesthesia. 4. GI bleeding: Recurrent, from small bowel AVMs. Unable to be anticoagulated. No recent overt bleeding.  - CBC today.  5. H/o DVT/PE: Provoked (post-surgery). No longer on anticoagulation due to h/o significant GIB. Has IVC filter.  6. CKD: Stage IV. Baseline SCr ~2.2. Follow closely with diuresis. - Followed by Dr. Royce Macadamia.  - BMET today.  Followup 3 wks with NP to reassess volume.   Rita [name], 11/18/2021

## 2021-11-22 ENCOUNTER — Telehealth (HOSPITAL_COMMUNITY): Payer: Self-pay | Admitting: Surgery

## 2021-11-22 NOTE — Telephone Encounter (Signed)
Patient called clinic and I called her back.  She tells me the that she attempted to complete the ordered home sleep study.  She encountered issues and was told that the device was not registered.  Rita [name] has had the device for over one year (given 10/19/2020).  I have asked Ms Sime to come to the [facility] tomorrow to pick up a new device and I will insure that this device is registered.

## 2021-11-27 ENCOUNTER — Other Ambulatory Visit: Payer: Self-pay | Admitting: Cardiology

## 2021-11-27 DIAGNOSIS — I5022 Chronic systolic (congestive) heart failure: Secondary | ICD-10-CM | POA: Diagnosis not present

## 2021-11-28 ENCOUNTER — Encounter (HOSPITAL_BASED_OUTPATIENT_CLINIC_OR_DEPARTMENT_OTHER): Payer: PPO | Admitting: Cardiology

## 2021-11-28 DIAGNOSIS — G4734 Idiopathic sleep related nonobstructive alveolar hypoventilation: Secondary | ICD-10-CM

## 2021-11-28 DIAGNOSIS — G4733 Obstructive sleep apnea (adult) (pediatric): Secondary | ICD-10-CM | POA: Diagnosis not present

## 2021-11-28 LAB — SPECIMEN STATUS REPORT

## 2021-11-28 NOTE — Procedures (Signed)
° °  Sleep Study Report  Patient Information Study Date: 11/28/21 Patient Name: Rita [name] Patient ID: [id number] Birth Date: Apr 25, 2056 Age: 65 Gender: Female Referring Physician: Loralie Champagne, MD  TEST DESCRIPTION: Home sleep apnea testing was completed using the WatchPat, a Type 1 device, utilizing peripheral arterial tonometry (PAT), chest movement, actigraphy, pulse oximetry, pulse rate, body position and snore. AHI was calculated with apnea and hypopnea using valid sleep time as the denominator. RDI includes apneas, hypopneas, and RERAs. The data acquired and the scoring of sleep and all associated events were performed in accordance with the recommended standards and specifications as outlined in the AASM Manual for the Scoring of Sleep and Associated Events 2.2.0 (2015).  FINDINGS: 1. Moderate Obstructive Sleep Apnea with AHI 16.3/hr. 2. No Central Sleep Apnea with pAHIc 0.7/hr. 3. Oxygen desaturations as low as 69%. 4. Mild snoring was present. O2 sats were < 88% for 51.9 min. 5. Total sleep time was hrs and min. 6. 25.4% of total sleep time was spent in REM sleep. 7. Shortened sleep onset latency at 6 min 8. Shortened REM sleep onset latency at 59 min. 9. Total awakenings were 7.  DIAGNOSIS: Moderate Obstructive Sleep Apnea (G47.33)  RECOMMENDATIONS: 1. Clinical correlation of these findings is necessary. The decision to treat obstructive sleep apnea (OSA) is usually based on the presence of apnea symptoms or the presence of associated medical conditions such as Hypertension, Congestive Heart Failure, Atrial Fibrillation or Obesity. The most common symptoms of OSA are snoring, gasping for breath while sleeping, daytime sleepiness and fatigue.  2. Initiating apnea therapy is recommended given the presence of symptoms and/or associated conditions. Recommend proceeding with one of the following:   a. Auto-CPAP therapy with a pressure range of 5-20cm H2O.   b. An  oral appliance (OA) that can be obtained from certain dentists with expertise in sleep medicine. These are primarily of use in non-obese patients with mild and moderate disease.   c. An ENT consultation which may be useful to look for specific causes of obstruction and possible treatment options.   d. If patient is intolerant to PAP therapy, consider referral to ENT for evaluation for hypoglossal nerve stimulator.  3. Close follow-up is necessary to ensure success with CPAP or oral appliance therapy for maximum benefit .  4. A follow-up oximetry study on CPAP is recommended to assess the adequacy of therapy and determine the need for supplemental oxygen or the potential need for Bi-level therapy. An arterial blood gas to determine the adequacy of baseline ventilation and oxygenation should also be considered.  5. Healthy sleep recommendations include: adequate nightly sleep (normal 7-9 hrs/night), avoidance of caffeine after noon and alcohol near bedtime, and maintaining a sleep environment that is cool, dark and quiet.  6. Weight loss for overweight patients is recommended. Even modest amounts of weight loss can significantly improve the severity of sleep apnea.  7. Snoring recommendations include: weight loss where appropriate, side sleeping, and avoidance of alcohol before bed.  8. Operation of motor vehicle should not be performed when sleepy.  Signature: Electronically Signed: 11/28/21 Fransico Him, MD; [facility]; [place], [place] Board of Sleep Medicine

## 2021-11-29 ENCOUNTER — Telehealth (HOSPITAL_COMMUNITY): Payer: Self-pay

## 2021-11-29 LAB — BASIC METABOLIC PANEL
BUN/Creatinine Ratio: 23 (ref 12–28)
BUN: 54 mg/dL — ABNORMAL HIGH (ref 8–27)
CO2: 36 mmol/L — ABNORMAL HIGH (ref 20–29)
Calcium: 9.8 mg/dL (ref 8.7–10.3)
Chloride: 89 mmol/L — ABNORMAL LOW (ref 96–106)
Creatinine, Ser: 2.33 mg/dL — ABNORMAL HIGH (ref 0.57–1.00)
Glucose: 75 mg/dL (ref 70–99)
Potassium: 3.4 mmol/L — ABNORMAL LOW (ref 3.5–5.2)
Sodium: 141 mmol/L (ref 134–144)
eGFR: 22 mL/min/{1.73_m2} — ABNORMAL LOW (ref >59)

## 2021-11-29 NOTE — Telephone Encounter (Addendum)
Pt aware, agreeable, and verbalized understanding   ----- Message from Larey Dresser, MD sent at 11/28/2021  9:48 AM EST ----- Increase total daily KCl by 20 mEq. BMET 2 wks.

## 2021-11-30 NOTE — Progress Notes (Signed)
ADVANCED HEART FAILURE CLINIC NOTE PCP: Dr Bea Graff Nephrology: Dr. Royce Macadamia HF Cardiology: Dr Aundra Dubin   HPI: Ms Colaizzi is a 65 y.o. 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    She has had multiple hospitalizations in the last year.  She was admitted January 2021 for COVID-19 pneumonia w/ acute hypoxic respiratory failure.  She did not require intubation.  She was treated with steroids and remdesivir.  She was readmitted twice in 7/21 for CHF exacerbations complicated by pleural effusion requiring IR guided thoracentesis.  She was readmitted again 8/21 for recurrent CHF and diuresed with IV Lasix.  Echo 8/21 showed moderately reduced systolic function, EF 35 to 40% with mild concentric left ventricular hypertrophy and grade 2 diastolic dysfunction, mild hypokinesis of the LV apical segment, RV systolic function was normal.    Readmitted recurrent CHF exacerbation plus AKI on CKD in 9/21.  Previous creatinine last admit was 1.76.  Creatinine on this admission elevated at 2.06. Chest x-ray demonstrated cardiomegaly and pulmonary venous congestion with recurrent right-sided pleural effusion.  BNP 673.  CBC showed chronic anemia, hemoglobin 8.9.  Also with worsening thrombocytopenia.  Platelets down to 84K. Echo repeated with EF 40-45%, RV ok, PASP 51 mmHg. Placed on milrinone to support RV for diuresis. Discharge weight 225 pounds. She was discharged on bidil but too expensive. Changed to Imdur + hydral. Cardiac MRI was done this admission, showed LV EF 41%, RV EF 27%,  D-shaped septum, RV insertion site LGE (RV>LV failure), no evidence for cardiac amyloidosis.   She has seen EP for Watchman evaluation given inability to take anticoagulation.  Dr. Quentin Ore thought that she is not a candidate for left atrial appendage occlusion given the risks associated with general anesthesia.  S/p Cardiomems 1/22.  Today she returns for HF follow up with her husband. She was markedly volume overloaded last visit and instructed to take metolazone for 2 days then add once weekly to her diuretic regimen. She feels her breathing has improved and swelling is  better. She is able to walk around the house and complete ADLs without dyspnea. She is limited by knee OA. Denies CP, dizziness, edema, or PND/Orthopnea. Appetite ok. No fever or chills. Weight at home 193 pounds. Taking all medications. She had a recent UTI over Thanksgiving and was seen at [facility] and admitted for this and given po abx. She sleeps with oxygen and has completed her home sleep study, results pending.  Labs (11/21): K 4.5, creatinine 2.11 Labs (5/22): K 4.1, creatinine 2.48 Labs (7/22): K 4.8, creatinine 2.56 Labs (8/22): K 4.1, creatinine 1.89 Labs (9/22): K 4.2, creatinine 2.25 Labs (10/22): K 4.4, creatinine 2.48 Labs (11/22): K 4.1, creatinine 2.12 Labs (12/22): K 3.4, creatinine 2.33, hgb 8.9, plt 95  ECG (personally reviewed): none ordered today.  Cardiomems (PAD goal 21): 17 on 12/05/21 PMH: 1. Chronic systolic CHF:  - Echo 5/28 EF 35-40%, mod HK, G2DD, RV normal - Echo 9/21 EF 40-45%, global hypokinesis, D-shaped septum, at least mildly decreased RV systolic function.  - Cardiac MRI (9/21): LV EF 41%, diffuse hypokinesis, D-shaped septum, RV mildly dilated with  RV EF 27%, nonspecific RV insertion site LGE.  No evidence for cardiac amyloidosis.  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  - RHC (10/21): mean RA 9, PA 54/17 mean 33, mean PCWP 20, CI 3.33, PVR 1.9 WU - Cardiomems placed 12/2020.  RHC (1/22) with mean RA 14, PA  60/22, mean PCWP 20, CI 3.06, PVR 3.2.  2. CKD stage IV 3. Type 2 diabetes 4. Pleural effusion s/p thoracentesis (due to CHF) 5. HTN 6. 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  7. COVID-19 infection 8. Atrial fibrillation: Chronic.  Not anticoagulated due to chronic anemia and GI bleeding.  - Not Watchman candidate per EP.  9. Anemia: Suspect due to chronic low grade GI bleeding (small bowel AVMs).  10. Suspect OSA/OHS: She is on home oxygen.  11. Left breast cancer: Treated remotely with chemo/radiation.  12. GERD 13. Chronic mild thrombocytopenia  ROS: All systems negative except as listed in HPI, PMH and Problem List.  Social History   Socioeconomic History   Marital status: Married    Spouse name: Not on file   Number of children: Not on file   Years of education: Not on file   Highest education level: Not on file  Occupational History   Not on file  Tobacco Use   Smoking status: Never   Smokeless tobacco: Never  Vaping Use   Vaping Use: Never used  Substance and Sexual Activity   Alcohol use: Never   Drug use: Never   Sexual activity: Not on file  Other Topics Concern   Not on file  Social History Narrative   Not on file   Social Determinants of Health   Financial Resource Strain: Not on file  Food Insecurity: Not on file  Transportation Needs: Not on file  Physical Activity: Not on file  Stress: Not on file  Social Connections: Not on file  Intimate Partner Violence: Not on file   Family History  Problem Relation Age of Onset   Cancer Mother    Current Outpatient Medications  Medication Sig Dispense Refill   acetaminophen (TYLENOL) 325 MG tablet Take 650 mg by mouth every 6 (six) hours as needed for mild pain or headache.     allopurinol (ZYLOPRIM) 100 MG tablet Take 100 mg by mouth 2 (two) times daily.     aspirin EC 81 MG tablet Take 81 mg by mouth daily. Swallow whole.     blood glucose meter kit and supplies Dispense based on patient and insurance preference.  Use up to four times daily as directed. (FOR ICD-10 E10.9, E11.9). 1 each 0   Blood Glucose Monitoring Suppl (FIFTY50 GLUCOSE METER 2.0) w/Device KIT See admin instructions.     carvedilol (COREG) 6.25 MG tablet Take 1 tablet (6.25 mg total) by mouth 2 (two) times daily with a meal. 60 tablet 6   ferrous sulfate 325 (65 FE) MG tablet Take 325 mg by mouth 2 (two) times daily with a meal.      glipiZIDE (GLUCOTROL XL) 5 MG 24 hr tablet Take 5 mg by mouth daily with breakfast.      hydrALAZINE (APRESOLINE) 25 MG tablet Take 1.5 tablets (37.5 mg total) by mouth 3 (three) times daily. 135 tablet 3   isosorbide mononitrate (IMDUR) 60 MG 24 hr tablet Take 1 tablet (60 mg total) by mouth daily. Needs appointment for further refill 90 tablet 0   Menthol-Methyl Salicylate (MUSCLE RUB) 10-15 % CREA Apply 1 application topically as needed for muscle pain (knees).  metolazone (ZAROXOLYN) 2.5 MG tablet Take 1 tablet (2.5 mg total) by mouth once a week. On Saturdays 10 tablet 3   Multiple Vitamin (MULTIVITAMIN WITH MINERALS) TABS tablet Take 2 tablets by mouth daily. Alive gummy bears     Multiple Vitamins-Minerals (VITAMIN D3 COMPLETE PO) Take 25 mcg by mouth daily.     OXYGEN Inhale 2 L into the lungs at bedtime. As needed     pantoprazole (PROTONIX) 40 MG tablet Take 40 mg by mouth as needed.     polyethylene glycol (MIRALAX / GLYCOLAX) 17 g packet Take 17 g by mouth as needed.     potassium chloride SA (KLOR-CON M) 20 MEQ tablet Take 1 tablet (20 mEq total) by mouth daily. Take extra tab when you take Metolazone     torsemide (DEMADEX) 20 MG tablet Take 5 tablets (100 mg total) by mouth 2 (two) times daily. 270 tablet 11   No current facility-administered medications for this encounter.   BP 136/64    Pulse 77    Wt 89 kg (196 lb 3.2 oz)    SpO2 93%    BMI 37.07 kg/m   Wt Readings from Last 3 Encounters:  12/06/21 89 kg (196 lb 3.2 oz)  11/16/21 99.2 kg (218 lb 12.8 oz)  10/10/21 102.4 kg (225 lb  12.8 oz)   PHYSICAL EXAM: General:  NAD. No resp difficulty, walked into clinic with RW HEENT: Normal Neck: Supple. No JVD. Carotids 2+ bilat; no bruits. No lymphadenopathy or thryomegaly appreciated. Cor: PMI nondisplaced. Regular rate & rhythm. No rubs, gallops, 2/6 HSM Lungs: Clear Abdomen: Obese,  nontender, nondistended. No hepatosplenomegaly. No bruits or masses. Good bowel sounds. Extremities: No cyanosis, clubbing, rash; trace BLE edema, compression hose on Neuro: Alert & oriented x 3, cranial nerves grossly intact. Moves all 4 extremities w/o difficulty. Affect pleasant.  ASSESSMENT & PLAN: 1.  Chronic systolic CHF: Prominent signs of RV failure.  9/21 echo showed EF 40-45%, global hypokinesis, D-shaped IV septum suggestive of RV pressure/volume overload, RV poorly visualized but appeared normal in size with at least mild systolic dysfunction. Cause of cardiomyopathy is uncertain.  She has history of COVID-19 1/21, cannot rule out viral myocarditis. Chemotherapy-mediated CMP is possible (not sure what chemo she had remotely for breast cancer).  Cardiac MRI showed LV EF 41% with diffuse hypokinesis, RV mildly dilated but with EF 27% and D-shaped septum, nonspecific RV insertion site LGE.  Suspect RV > LV failure. Cardiac MRI is not suggestive of amyloidosis, no M-spike on myeloma panel. We have not ruled out CAD (cath deferred given lack of ischemic CP and CKD). Milrinone started primarily for RV support during admission in 9/21 and titrated off. She had cardiomems placed 1/22. Today, NYHA class III symptoms, functional capacity limited by body habitus and knee pain.  She is not volume overloaded on exam or Cardiomems, weight is down 22 lbs. - Continue torsemide 100 mg bid + metolazone 2.5 mg every Saturday with extra 20 mEq of KCL on metolazone days. BMET today. - Continue Coreg 6.25 mg bid.  - Continue hydralazine 37.5 mg tid and continue Imdur 60 mg daily. - If renal function remains  stable, would start SGLT2 inhibitor (favor Jardiance). Will hold off today as she has had a recent UTI. - No ARNI/spironolactone for now with elevated creatinine.  - Repeat echo. 2. Chronic hypoxemic respiratory failure: Suspect OHS/OSA.  She was using home oxygen prior to COVID-19 PNA.  - Continue supplemental oxygen at night. -  She has completed home sleep study, results pending. 3. Atrial fibrillation: Chronic, all ECGs since 7/21 show atrial fibrillation.  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 Rate is controlled. No overt bleeding.  - She cannot be cardioverted at this point as she cannot be chronically anticoagulated. - Evaluated by Dr. Quentin Ore for [facility] but not a suitable candidate given multiple severe medical comorbidities and risks associated with general anesthesia. 4. GI bleeding: Recurrent, from small bowel AVMs. Unable to be anticoagulated. No recent overt bleeding.  - recent CBC showed hgb and plts remain low. Needs follow up with GI/hematology. She will call her GI provider and make an appt. 5. H/o DVT/PE: Provoked (post-surgery). No longer on anticoagulation due to h/o significant GIB. Has IVC filter.  6. CKD: Stage IV. Baseline SCr ~2.2. Follow closely with diuresis. - Followed by Dr. Royce Macadamia.  - BMET today.  Followup in 4 weeks with APP to reassess volume and consider addition of Jardiance.   [place], 12/06/2021

## 2021-12-06 ENCOUNTER — Other Ambulatory Visit: Payer: Self-pay

## 2021-12-06 ENCOUNTER — Encounter (HOSPITAL_COMMUNITY): Payer: Self-pay

## 2021-12-06 ENCOUNTER — Ambulatory Visit (HOSPITAL_COMMUNITY)
Admission: RE | Admit: 2021-12-06 | Discharge: 2021-12-06 | Disposition: A | Discharge status: Home / Self Care | Payer: PPO | Source: Ambulatory Visit | Attending: Family Medicine | Admitting: Family Medicine

## 2021-12-06 ENCOUNTER — Telehealth (HOSPITAL_COMMUNITY): Payer: Self-pay | Admitting: Surgery

## 2021-12-06 VITALS — BP 136/64 | HR 77 | Wt 196.2 lb

## 2021-12-06 DIAGNOSIS — I4821 Permanent atrial fibrillation: Secondary | ICD-10-CM | POA: Diagnosis not present

## 2021-12-06 DIAGNOSIS — E1122 Type 2 diabetes mellitus with diabetic chronic kidney disease: Secondary | ICD-10-CM | POA: Insufficient documentation

## 2021-12-06 DIAGNOSIS — I130 Hypertensive heart and chronic kidney disease with heart failure and stage 1 through stage 4 chronic kidney disease, or unspecified chronic kidney disease: Secondary | ICD-10-CM | POA: Insufficient documentation

## 2021-12-06 DIAGNOSIS — Z79899 Other long term (current) drug therapy: Secondary | ICD-10-CM | POA: Insufficient documentation

## 2021-12-06 DIAGNOSIS — Z86711 Personal history of pulmonary embolism: Secondary | ICD-10-CM | POA: Diagnosis not present

## 2021-12-06 DIAGNOSIS — K219 Gastro-esophageal reflux disease without esophagitis: Secondary | ICD-10-CM | POA: Diagnosis not present

## 2021-12-06 DIAGNOSIS — I739 Peripheral vascular disease, unspecified: Secondary | ICD-10-CM | POA: Insufficient documentation

## 2021-12-06 DIAGNOSIS — Z87440 Personal history of urinary (tract) infections: Secondary | ICD-10-CM | POA: Diagnosis not present

## 2021-12-06 DIAGNOSIS — Z9889 Other specified postprocedural states: Secondary | ICD-10-CM | POA: Insufficient documentation

## 2021-12-06 DIAGNOSIS — I5022 Chronic systolic (congestive) heart failure: Secondary | ICD-10-CM | POA: Diagnosis not present

## 2021-12-06 DIAGNOSIS — Z9221 Personal history of antineoplastic chemotherapy: Secondary | ICD-10-CM | POA: Insufficient documentation

## 2021-12-06 DIAGNOSIS — E669 Obesity, unspecified: Secondary | ICD-10-CM | POA: Insufficient documentation

## 2021-12-06 DIAGNOSIS — D649 Anemia, unspecified: Secondary | ICD-10-CM | POA: Diagnosis not present

## 2021-12-06 DIAGNOSIS — I482 Chronic atrial fibrillation, unspecified: Secondary | ICD-10-CM | POA: Insufficient documentation

## 2021-12-06 DIAGNOSIS — Z8616 Personal history of COVID-19: Secondary | ICD-10-CM | POA: Diagnosis not present

## 2021-12-06 DIAGNOSIS — Z853 Personal history of malignant neoplasm of breast: Secondary | ICD-10-CM | POA: Diagnosis not present

## 2021-12-06 DIAGNOSIS — Z86718 Personal history of other venous thrombosis and embolism: Secondary | ICD-10-CM | POA: Diagnosis not present

## 2021-12-06 DIAGNOSIS — J90 Pleural effusion, not elsewhere classified: Secondary | ICD-10-CM | POA: Diagnosis not present

## 2021-12-06 DIAGNOSIS — M179 Osteoarthritis of knee, unspecified: Secondary | ICD-10-CM | POA: Diagnosis not present

## 2021-12-06 DIAGNOSIS — Z923 Personal history of irradiation: Secondary | ICD-10-CM | POA: Diagnosis not present

## 2021-12-06 DIAGNOSIS — Q2739 Arteriovenous malformation, other site: Secondary | ICD-10-CM | POA: Diagnosis not present

## 2021-12-06 DIAGNOSIS — I878 Other specified disorders of veins: Secondary | ICD-10-CM | POA: Insufficient documentation

## 2021-12-06 DIAGNOSIS — Z8719 Personal history of other diseases of the digestive system: Secondary | ICD-10-CM | POA: Diagnosis not present

## 2021-12-06 DIAGNOSIS — Z9981 Dependence on supplemental oxygen: Secondary | ICD-10-CM | POA: Insufficient documentation

## 2021-12-06 DIAGNOSIS — N184 Chronic kidney disease, stage 4 (severe): Secondary | ICD-10-CM

## 2021-12-06 DIAGNOSIS — J9611 Chronic respiratory failure with hypoxia: Secondary | ICD-10-CM | POA: Diagnosis not present

## 2021-12-06 DIAGNOSIS — Z8701 Personal history of pneumonia (recurrent): Secondary | ICD-10-CM | POA: Diagnosis not present

## 2021-12-06 DIAGNOSIS — D696 Thrombocytopenia, unspecified: Secondary | ICD-10-CM | POA: Diagnosis not present

## 2021-12-06 DIAGNOSIS — M25569 Pain in unspecified knee: Secondary | ICD-10-CM | POA: Insufficient documentation

## 2021-12-06 DIAGNOSIS — Z6837 Body mass index (BMI) 37.0-37.9, adult: Secondary | ICD-10-CM | POA: Insufficient documentation

## 2021-12-06 LAB — BASIC METABOLIC PANEL
Anion gap: 12 (ref 5–15)
BUN: 53 mg/dL — ABNORMAL HIGH (ref 8–23)
CO2: 33 mmol/L — ABNORMAL HIGH (ref 22–32)
Calcium: 9.6 mg/dL (ref 8.9–10.3)
Chloride: 94 mmol/L — ABNORMAL LOW (ref 98–111)
Creatinine, Ser: 2.32 mg/dL — ABNORMAL HIGH (ref 0.44–1.00)
GFR, Estimated: 22 mL/min — ABNORMAL LOW (ref >60)
Glucose, Bld: 106 mg/dL — ABNORMAL HIGH (ref 70–99)
Potassium: 3.1 mmol/L — ABNORMAL LOW (ref 3.5–5.1)
Sodium: 139 mmol/L (ref 135–145)

## 2021-12-06 NOTE — Telephone Encounter (Signed)
I attempted to reach patient and left a message for a return call.  

## 2021-12-06 NOTE — Patient Instructions (Addendum)
Thank you for coming in today  Labs were done today, if any labs are abnormal the clinic will call you   Your physician recommends that you schedule a follow-up appointment in: 4 weeks in clinic  [place] doctor appointment  At the [facility], you and your health needs are our priority. As part of our continuing mission to provide you with exceptional heart care, we have created designated Provider Care Teams. These Care Teams include your primary Cardiologist (physician) and Advanced Practice Providers (APPs- Physician Assistants and Nurse Practitioners) who all work together to provide you with the care you need, when you need it.   You may see any of the following providers on your designated Care Team at your next follow up: Dr Glori Bickers Dr Haynes Kerns, NP Lyda Jester, [place] [facility] [place], [place] Audry Riles, PharmD   Please be sure to bring in all your medications bottles to every appointment.   If you have any questions or concerns before your next appointment please send [place] a message through [place] or call our office at [phone].    TO LEAVE A MESSAGE FOR THE NURSE SELECT OPTION 2, PLEASE LEAVE A MESSAGE INCLUDING: YOUR NAME DATE OF BIRTH CALL BACK NUMBER REASON FOR CALL**this is important as we prioritize the call backs  YOU WILL RECEIVE A CALL BACK THE SAME DAY AS LONG AS YOU CALL BEFORE 4:00 PM

## 2021-12-06 NOTE — Telephone Encounter (Signed)
-----   Message from Rafael Bihari, [place] sent at 12/06/2021 12:52 PM EST ----- Potassium is low. Has she been consistently taking 20 mEq daily and taking extra 20 on metolazone days?  If so, needs to take extra 40 KCL today (60 kcl total for today) and increase daily KCL dose to 40 mEq daily. Repeat BMET in 1 weeks.

## 2021-12-07 ENCOUNTER — Telehealth (HOSPITAL_COMMUNITY): Payer: Self-pay | Admitting: Surgery

## 2021-12-07 DIAGNOSIS — I5022 Chronic systolic (congestive) heart failure: Secondary | ICD-10-CM

## 2021-12-07 MED ORDER — POTASSIUM CHLORIDE CRYS ER 20 MEQ PO TBCR: 40.0000 meq | EXTENDED_RELEASE_TABLET | Freq: Every day | ORAL | Status: DC

## 2021-12-07 NOTE — Telephone Encounter (Signed)
-----   Message from Rafael Bihari,  sent at 12/06/2021 12:52 PM EST ----- Potassium is low. Has she been consistently taking 20 mEq daily and taking extra 20 on metolazone days?  If so, needs to take extra 40 KCL today (60 kcl total for today) and increase daily KCL dose to 40 mEq daily. Repeat BMET in 1 weeks.

## 2021-12-07 NOTE — Telephone Encounter (Signed)
-----   Message from Rafael Bihari, [place] sent at 12/06/2021 12:52 PM EST ----- Potassium is low. Has she been consistently taking 20 mEq daily and taking extra 20 on metolazone days?  If so, needs to take extra 40 KCL today (60 kcl total for today) and increase daily KCL dose to 40 mEq daily. Repeat BMET in 1 weeks.

## 2021-12-07 NOTE — Telephone Encounter (Signed)
I attempted to reach both numbers in the chart listed for patient to discuss results and recommendations per Allena Katz NP.  I left a message at both numbers for a return call.

## 2021-12-07 NOTE — Telephone Encounter (Signed)
Ms Konkel called back.  I reviewed the results and recommendations per provider.  I have changed medication list in [facility] and scheduled a follow-up appt for next Friday Jan. 6th.  Patient aware and agreeable.

## 2021-12-11 ENCOUNTER — Other Ambulatory Visit: Payer: Self-pay

## 2021-12-11 ENCOUNTER — Ambulatory Visit (HOSPITAL_COMMUNITY)
Admission: RE | Admit: 2021-12-11 | Discharge: 2021-12-11 | Disposition: A | Discharge status: Home / Self Care | Payer: PPO | Source: Ambulatory Visit | Attending: Adult Health | Admitting: Adult Health

## 2021-12-11 DIAGNOSIS — I5022 Chronic systolic (congestive) heart failure: Secondary | ICD-10-CM

## 2021-12-11 DIAGNOSIS — I5032 Chronic diastolic (congestive) heart failure: Secondary | ICD-10-CM | POA: Diagnosis not present

## 2021-12-11 NOTE — Progress Notes (Signed)
Cardiomems Remote Monitoring  Cardiomems Implanted 12/26/2020   BMET 12/06/21 Creatinine 2.3 K 3.1   I have reviewed weekly PAD reading from cardiomems device.  PAD Goal 21. Todays reading is 14. Plan to continue current diuretic regimen.   Follow weekly readings.   Elham Fini NP-C  3:51 PM

## 2021-12-12 ENCOUNTER — Ambulatory Visit: Payer: PPO

## 2021-12-12 DIAGNOSIS — R400 Somnolence: Secondary | ICD-10-CM

## 2021-12-14 ENCOUNTER — Other Ambulatory Visit: Payer: Self-pay

## 2021-12-14 ENCOUNTER — Ambulatory Visit (HOSPITAL_COMMUNITY)
Admission: RE | Admit: 2021-12-14 | Discharge: 2021-12-14 | Disposition: A | Discharge status: Home / Self Care | Payer: PPO | Source: Ambulatory Visit | Attending: Internal Medicine | Admitting: Internal Medicine

## 2021-12-14 ENCOUNTER — Telehealth (HOSPITAL_COMMUNITY): Payer: Self-pay | Admitting: Surgery

## 2021-12-14 DIAGNOSIS — I5022 Chronic systolic (congestive) heart failure: Secondary | ICD-10-CM | POA: Insufficient documentation

## 2021-12-14 DIAGNOSIS — D649 Anemia, unspecified: Secondary | ICD-10-CM | POA: Insufficient documentation

## 2021-12-14 LAB — BASIC METABOLIC PANEL
Anion gap: 12 (ref 5–15)
BUN: 69 mg/dL — ABNORMAL HIGH (ref 8–23)
CO2: 33 mmol/L — ABNORMAL HIGH (ref 22–32)
Calcium: 9.9 mg/dL (ref 8.9–10.3)
Chloride: 90 mmol/L — ABNORMAL LOW (ref 98–111)
Creatinine, Ser: 2.9 mg/dL — ABNORMAL HIGH (ref 0.44–1.00)
GFR, Estimated: 17 mL/min — ABNORMAL LOW (ref >60)
Glucose, Bld: 157 mg/dL — ABNORMAL HIGH (ref 70–99)
Potassium: 3.6 mmol/L (ref 3.5–5.1)
Sodium: 135 mmol/L (ref 135–145)

## 2021-12-14 LAB — IRON AND TIBC
Iron: 98 ug/dL (ref 28–170)
Saturation Ratios: 33 % — ABNORMAL HIGH (ref 10.4–31.8)
TIBC: 295 ug/dL (ref 250–450)
UIBC: 197 ug/dL

## 2021-12-14 LAB — FERRITIN: Ferritin: 312 ng/mL — ABNORMAL HIGH (ref 11–307)

## 2021-12-14 MED ORDER — TORSEMIDE 20 MG PO TABS: 80.0000 mg | ORAL_TABLET | Freq: Two times a day (BID) | ORAL | 11 refills | Status: DC

## 2021-12-14 NOTE — Telephone Encounter (Signed)
-----   Message from Larey Dresser, MD sent at 12/14/2021  2:33 PM EST ----- No metolazone this Saturday (wait until next Saturday for next dose) and decrease torsemide to 80 mg bid.  BMET in 1 week.

## 2021-12-14 NOTE — Telephone Encounter (Signed)
I called Rita [name] and made her aware of results and recommendations per Dr. Aundra Dubin.  She will hold off on the Metolazone for this Saturday and will decrease Torsemide to 80 mg twice daily.  I have updated her medlist in [facility] and she will return to our clinic for labwork on Friday January 13th.

## 2021-12-21 ENCOUNTER — Ambulatory Visit (HOSPITAL_COMMUNITY)
Admission: RE | Admit: 2021-12-21 | Discharge: 2021-12-21 | Disposition: A | Discharge status: Home / Self Care | Payer: PPO | Source: Ambulatory Visit | Attending: Internal Medicine | Admitting: Internal Medicine

## 2021-12-21 ENCOUNTER — Other Ambulatory Visit: Payer: Self-pay

## 2021-12-21 DIAGNOSIS — I5022 Chronic systolic (congestive) heart failure: Secondary | ICD-10-CM | POA: Diagnosis present

## 2021-12-21 LAB — BASIC METABOLIC PANEL
Anion gap: 11 (ref 5–15)
BUN: 65 mg/dL — ABNORMAL HIGH (ref 8–23)
CO2: 31 mmol/L (ref 22–32)
Calcium: 9.7 mg/dL (ref 8.9–10.3)
Chloride: 98 mmol/L (ref 98–111)
Creatinine, Ser: 2.63 mg/dL — ABNORMAL HIGH (ref 0.44–1.00)
GFR, Estimated: 19 mL/min — ABNORMAL LOW (ref >60)
Glucose, Bld: 86 mg/dL (ref 70–99)
Potassium: 4.1 mmol/L (ref 3.5–5.1)
Sodium: 140 mmol/L (ref 135–145)

## 2021-12-26 ENCOUNTER — Other Ambulatory Visit: Payer: Self-pay | Admitting: Cardiology

## 2021-12-28 ENCOUNTER — Telehealth: Payer: Self-pay | Admitting: *Deleted

## 2021-12-28 ENCOUNTER — Ambulatory Visit (HOSPITAL_COMMUNITY)
Admission: RE | Admit: 2021-12-28 | Discharge: 2021-12-28 | Disposition: A | Discharge status: Home / Self Care | Payer: PPO | Source: Ambulatory Visit | Attending: Internal Medicine | Admitting: Internal Medicine

## 2021-12-28 ENCOUNTER — Other Ambulatory Visit: Payer: Self-pay

## 2021-12-28 ENCOUNTER — Other Ambulatory Visit: Payer: Self-pay | Admitting: Cardiology

## 2021-12-28 DIAGNOSIS — N189 Chronic kidney disease, unspecified: Secondary | ICD-10-CM | POA: Diagnosis not present

## 2021-12-28 DIAGNOSIS — G4733 Obstructive sleep apnea (adult) (pediatric): Secondary | ICD-10-CM

## 2021-12-28 DIAGNOSIS — I4891 Unspecified atrial fibrillation: Secondary | ICD-10-CM | POA: Diagnosis not present

## 2021-12-28 DIAGNOSIS — E1122 Type 2 diabetes mellitus with diabetic chronic kidney disease: Secondary | ICD-10-CM | POA: Diagnosis not present

## 2021-12-28 DIAGNOSIS — I130 Hypertensive heart and chronic kidney disease with heart failure and stage 1 through stage 4 chronic kidney disease, or unspecified chronic kidney disease: Secondary | ICD-10-CM | POA: Insufficient documentation

## 2021-12-28 DIAGNOSIS — I5022 Chronic systolic (congestive) heart failure: Secondary | ICD-10-CM | POA: Insufficient documentation

## 2021-12-28 DIAGNOSIS — I340 Nonrheumatic mitral (valve) insufficiency: Secondary | ICD-10-CM | POA: Diagnosis not present

## 2021-12-28 LAB — ECHOCARDIOGRAM COMPLETE: S' Lateral: 3.2 cm

## 2021-12-28 NOTE — Progress Notes (Signed)
°  Echocardiogram 2D Echocardiogram has been performed.  Rita [name] 12/28/2021, 12:00 PM

## 2021-12-28 NOTE — Telephone Encounter (Signed)
Patient notified of itamar sleep results. She agrees to proceed with CPAP titration.

## 2022-01-04 ENCOUNTER — Telehealth: Payer: Self-pay | Admitting: *Deleted

## 2022-01-04 ENCOUNTER — Encounter (HOSPITAL_COMMUNITY): Payer: Self-pay

## 2022-01-04 ENCOUNTER — Other Ambulatory Visit: Payer: Self-pay

## 2022-01-04 ENCOUNTER — Ambulatory Visit (HOSPITAL_COMMUNITY)
Admission: RE | Admit: 2022-01-04 | Discharge: 2022-01-04 | Disposition: A | Discharge status: Home / Self Care | Payer: PPO | Source: Ambulatory Visit | Attending: Family Medicine | Admitting: Family Medicine

## 2022-01-04 VITALS — BP 140/75 | HR 82 | Wt 196.0 lb

## 2022-01-04 DIAGNOSIS — Z853 Personal history of malignant neoplasm of breast: Secondary | ICD-10-CM | POA: Diagnosis not present

## 2022-01-04 DIAGNOSIS — Z9981 Dependence on supplemental oxygen: Secondary | ICD-10-CM | POA: Diagnosis not present

## 2022-01-04 DIAGNOSIS — I130 Hypertensive heart and chronic kidney disease with heart failure and stage 1 through stage 4 chronic kidney disease, or unspecified chronic kidney disease: Secondary | ICD-10-CM | POA: Insufficient documentation

## 2022-01-04 DIAGNOSIS — D649 Anemia, unspecified: Secondary | ICD-10-CM | POA: Insufficient documentation

## 2022-01-04 DIAGNOSIS — Z86711 Personal history of pulmonary embolism: Secondary | ICD-10-CM | POA: Insufficient documentation

## 2022-01-04 DIAGNOSIS — E669 Obesity, unspecified: Secondary | ICD-10-CM | POA: Diagnosis not present

## 2022-01-04 DIAGNOSIS — I4821 Permanent atrial fibrillation: Secondary | ICD-10-CM | POA: Diagnosis not present

## 2022-01-04 DIAGNOSIS — Z86718 Personal history of other venous thrombosis and embolism: Secondary | ICD-10-CM | POA: Diagnosis not present

## 2022-01-04 DIAGNOSIS — Z923 Personal history of irradiation: Secondary | ICD-10-CM | POA: Insufficient documentation

## 2022-01-04 DIAGNOSIS — Z8719 Personal history of other diseases of the digestive system: Secondary | ICD-10-CM | POA: Insufficient documentation

## 2022-01-04 DIAGNOSIS — N184 Chronic kidney disease, stage 4 (severe): Secondary | ICD-10-CM | POA: Insufficient documentation

## 2022-01-04 DIAGNOSIS — Z8616 Personal history of COVID-19: Secondary | ICD-10-CM | POA: Insufficient documentation

## 2022-01-04 DIAGNOSIS — I272 Pulmonary hypertension, unspecified: Secondary | ICD-10-CM | POA: Insufficient documentation

## 2022-01-04 DIAGNOSIS — J9611 Chronic respiratory failure with hypoxia: Secondary | ICD-10-CM | POA: Diagnosis not present

## 2022-01-04 DIAGNOSIS — E1122 Type 2 diabetes mellitus with diabetic chronic kidney disease: Secondary | ICD-10-CM | POA: Insufficient documentation

## 2022-01-04 DIAGNOSIS — I482 Chronic atrial fibrillation, unspecified: Secondary | ICD-10-CM | POA: Insufficient documentation

## 2022-01-04 DIAGNOSIS — Z79899 Other long term (current) drug therapy: Secondary | ICD-10-CM | POA: Diagnosis not present

## 2022-01-04 DIAGNOSIS — I5022 Chronic systolic (congestive) heart failure: Secondary | ICD-10-CM | POA: Insufficient documentation

## 2022-01-04 DIAGNOSIS — K219 Gastro-esophageal reflux disease without esophagitis: Secondary | ICD-10-CM | POA: Diagnosis not present

## 2022-01-04 LAB — BASIC METABOLIC PANEL
Anion gap: 10 (ref 5–15)
BUN: 52 mg/dL — ABNORMAL HIGH (ref 8–23)
CO2: 31 mmol/L (ref 22–32)
Calcium: 9.6 mg/dL (ref 8.9–10.3)
Chloride: 96 mmol/L — ABNORMAL LOW (ref 98–111)
Creatinine, Ser: 2.33 mg/dL — ABNORMAL HIGH (ref 0.44–1.00)
GFR, Estimated: 22 mL/min — ABNORMAL LOW (ref >60)
Glucose, Bld: 79 mg/dL (ref 70–99)
Potassium: 4.1 mmol/L (ref 3.5–5.1)
Sodium: 137 mmol/L (ref 135–145)

## 2022-01-04 LAB — BRAIN NATRIURETIC PEPTIDE: B Natriuretic Peptide: 590.4 pg/mL — ABNORMAL HIGH (ref 0.0–100.0)

## 2022-01-04 MED ORDER — METOLAZONE 2.5 MG PO TABS: 2.5000 mg | ORAL_TABLET | ORAL | 3 refills | Status: DC

## 2022-01-04 NOTE — Telephone Encounter (Signed)
Prior Authorization for CPAP titration sent to [facility] via Fax  @ [phone].

## 2022-01-04 NOTE — Progress Notes (Signed)
ADVANCED HEART FAILURE CLINIC NOTE PCP: Dr Bea Graff Nephrology: Dr. Royce Macadamia HF Cardiology: Dr Aundra Dubin   HPI: Rita [name] is a 66 y.o. 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    She has had multiple hospitalizations in the last year.  She was admitted January 2021 for COVID-19 pneumonia w/ acute hypoxic respiratory failure.  She did not require intubation.  She was treated with steroids and remdesivir.  She was readmitted twice in 7/21 for CHF exacerbations complicated by pleural effusion requiring IR guided thoracentesis.  She was readmitted again 8/21 for recurrent CHF and diuresed with IV Lasix.  Echo 8/21 showed moderately reduced systolic function, EF 35 to 40% with mild concentric left ventricular hypertrophy and grade 2 diastolic dysfunction, mild hypokinesis of the LV apical segment, RV systolic function was normal.    Readmitted recurrent CHF exacerbation plus AKI on CKD in 9/21.  Previous creatinine last admit was 1.76.  Creatinine on this admission elevated at 2.06. Chest x-ray demonstrated cardiomegaly and pulmonary venous congestion with recurrent right-sided pleural effusion.  BNP 673.  CBC showed chronic anemia, hemoglobin 8.9.  Also with worsening thrombocytopenia.  Platelets down to 84K. Echo repeated with EF 40-45%, RV ok, PASP 51 mmHg. Placed on milrinone to support RV for diuresis. Discharge weight 225 pounds. She was discharged on bidil but too expensive. Changed to Imdur + hydral. Cardiac MRI was done this admission, showed LV EF 41%, RV EF 27%,  D-shaped septum, RV insertion site LGE (RV>LV failure), no evidence for cardiac amyloidosis.   She has seen EP for Watchman evaluation given inability to take anticoagulation.  Dr. Quentin Ore thought that she is not a candidate for left atrial appendage occlusion given the risks associated with general anesthesia.  S/p Cardiomems 1/22.  Admitted 11/22 to [facility] for urosepsis.   Echo (1/23): EF 55-60%, mild LVH, RV mildly enlarged, severely elevated pulmonary artery systolic pressure 68 mmHg, mild MR  Today she returns for HF follow up with her husband. She does not have significant dyspnea walking with her rolling walker. She is able to walk further distances at the mall now without having to stop and rest. Main limitation physically is her knee OA. Denies abnormal bleeding, CP, dizziness, edema, or PND/Orthopnea. Appetite ok. No fever or chills. Weight at home stable. She has not had any metolazone in over a week. She is awaiting her CPAP titration, continues to sleep with oxygen.   Labs (11/21): K 4.5, creatinine 2.11 Labs (5/22): K 4.1, creatinine 2.48 Labs (7/22): K 4.8, creatinine 2.56 Labs (8/22): K 4.1, creatinine 1.89 Labs (9/22): K 4.2, creatinine 2.25 Labs (10/22): K 4.4, creatinine 2.48 Labs (11/22): K 4.1, creatinine 2.12 Labs (12/22): K 3.4, creatinine 2.33, hgb 8.9, plt 95 Labs (1/23): K 4.1, creatinine 2.63  ECG (personally reviewed): none ordered today.  Cardiomems (PAD goal 21): 14 on 01/02/22  PMH: 1. Chronic systolic CHF:  - Echo 6/56 EF 35-40%, mod HK, G2DD, RV normal - Echo 9/21 EF 40-45%, global hypokinesis, D-shaped septum, at least mildly decreased RV systolic function.  - Cardiac MRI (9/21): LV EF 41%, diffuse hypokinesis, D-shaped septum, RV  mildly dilated with RV EF 27%, nonspecific RV insertion site LGE.  No evidence for cardiac amyloidosis.  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  - RHC (10/21): mean RA 9, PA 54/17 mean 33, mean PCWP 20, CI 3.33, PVR 1.9 WU - Cardiomems placed  12/2020.  RHC (1/22) with mean RA 14, PA 60/22, mean PCWP 20, CI 3.06, PVR 3.2.  - Echo (1/23): EF 55-60%, mild LVH, RV mildly enlarged, severely elevated pulmonary artery systolic pressure 68 mmHg, mild MR 2. CKD stage IV 3. Type 2 diabetes 4. Pleural effusion s/p thoracentesis (due to CHF) 5. HTN 6. 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  7. COVID-19 infection 8. Atrial fibrillation: Chronic.  Not anticoagulated due to chronic anemia and GI bleeding.  - Not Watchman candidate per EP.  9. Anemia: Suspect due to chronic low grade GI bleeding (small bowel AVMs).  10. Suspect OSA/OHS: She is on home oxygen.  11. Left breast cancer: Treated remotely with chemo/radiation.  12. GERD 13. Chronic mild thrombocytopenia  ROS: All systems negative except as listed in HPI, PMH and Problem List.  Social History   Socioeconomic History   Marital status: Married    Spouse name: Not on file   Number of children: Not on file   Years of education: Not on file   Highest education level: Not on file  Occupational History   Not on file  Tobacco Use   Smoking status: Never   Smokeless tobacco: Never  Vaping Use   Vaping Use: Never used  Substance and Sexual Activity   Alcohol use: Never   Drug use: Never   Sexual activity: Not on file  Other Topics Concern   Not on file  Social History Narrative   Not on file   Social Determinants of Health   Financial Resource Strain: Not on file  Food Insecurity: Not on file  Transportation Needs: Not on file  Physical Activity: Not on file  Stress: Not on file  Social Connections: Not on file  Intimate Partner Violence: Not on file   Family History  Problem Relation Age of Onset   Cancer Mother    Current Outpatient Medications  Medication Sig Dispense Refill   acetaminophen (TYLENOL) 325 MG tablet Take 650 mg by mouth every 6 (six) hours as needed for mild pain or headache.     allopurinol (ZYLOPRIM) 100 MG tablet Take 100 mg by mouth 2 (two) times  daily.     aspirin EC 81 MG tablet Take 81 mg by mouth daily. Swallow whole.     blood glucose meter kit and supplies Dispense based on patient and insurance preference. Use up to four times daily as directed. (FOR ICD-10 E10.9, E11.9). 1 each 0   Blood Glucose Monitoring Suppl (FIFTY50 GLUCOSE METER 2.0) w/Device KIT See admin instructions.     carvedilol (COREG) 6.25 MG tablet Take 1 tablet (6.25 mg total) by mouth 2 (two) times daily with a meal. 60 tablet 6   ferrous sulfate 325 (65 FE) MG tablet Take 325 mg by mouth 2 (two) times daily with a meal.      glipiZIDE (GLUCOTROL XL) 5 MG 24 hr tablet Take 5 mg by mouth daily with breakfast.      hydrALAZINE (APRESOLINE) 25 MG tablet Take 1.5 tablets (37.5 mg total) by mouth 3 (three) times daily. 135 tablet 3   isosorbide mononitrate (IMDUR) 60 MG 24 hr tablet Take 1 tablet (60 mg total) by mouth daily. Needs appointment for further refill 90 tablet 0  Menthol-Methyl Salicylate (MUSCLE RUB) 10-15 % CREA Apply 1 application topically as needed for muscle pain (knees).      Multiple Vitamin (MULTIVITAMIN WITH MINERALS) TABS tablet Take 2 tablets by mouth daily. Alive gummy bears     Multiple Vitamins-Minerals (VITAMIN D3 COMPLETE PO) Take 25 mcg by mouth daily.     OXYGEN Inhale 2 L into the lungs at bedtime. As needed     pantoprazole (PROTONIX) 40 MG tablet Take 40 mg by mouth as needed.     polyethylene glycol (MIRALAX / GLYCOLAX) 17 g packet Take 17 g by mouth as needed.     potassium chloride SA (KLOR-CON M) 20 MEQ tablet Take 2 tablets (40 mEq total) by mouth daily. Take extra tab when you take Metolazone     torsemide (DEMADEX) 20 MG tablet Take 4 tablets (80 mg total) by mouth 2 (two) times daily. 270 tablet 11   metolazone (ZAROXOLYN) 2.5 MG tablet Take 1 tablet (2.5 mg total) by mouth once a week. On Saturdays with an extra potassium tablet 10 tablet 3   No current facility-administered medications for this encounter.   BP 140/75     Pulse 82    Wt 88.9 kg (196 lb)    SpO2 92%    BMI 37.03 kg/m   Wt Readings from Last 3 Encounters:  01/04/22 88.9 kg (196 lb)  12/06/21 89 kg (196 lb 3.2 oz)  11/16/21 99.2 kg (218 lb 12.8 oz)   PHYSICAL EXAM: General:  NAD. No resp difficulty, walked into clinic with RW HEENT: Normal Neck: Supple. No JVD. Carotids 2+ bilat; no bruits. No lymphadenopathy or thryomegaly appreciated. Cor: PMI nondisplaced. Irregular rate & rhythm. No rubs, gallops, 2/6 HSM apex Lungs: Clear Abdomen: Obese, nontender, nondistended. No hepatosplenomegaly. No bruits or masses. Good bowel sounds. Extremities: No cyanosis, clubbing, rash, edema Neuro: Alert & oriented x 3, cranial nerves grossly intact. Moves all 4 extremities w/o difficulty. Affect pleasant.  ASSESSMENT & PLAN: 1.  Chronic systolic CHF: Prominent signs of RV failure.  9/21 echo showed EF 40-45%, global hypokinesis, D-shaped IV septum suggestive of RV pressure/volume overload, RV poorly visualized but appeared normal in size with at least mild systolic dysfunction. Cause of cardiomyopathy is uncertain.  She has history of COVID-19 1/21, cannot rule out viral myocarditis. Chemotherapy-mediated CMP is possible (not sure what chemo she had remotely for breast cancer).  Cardiac MRI showed LV EF 41% with diffuse hypokinesis, RV mildly dilated but with EF 27% and D-shaped septum, nonspecific RV insertion site LGE.  Suspect RV > LV failure. Cardiac MRI is not suggestive of amyloidosis, no M-spike on myeloma panel. We have not ruled out CAD (cath deferred given lack of ischemic CP and CKD). Milrinone started primarily for RV support during admission in 9/21 and titrated off. She had cardiomems placed 1/22. Echo 1/23 EF 55-60%. Today, improved NYHA class III symptoms, functional capacity limited by body habitus and knee pain.  She is not volume overloaded on exam or Cardiomems, weight is down 22 lbs. - Continue torsemide 80 mg bid + metolazone 2.5 mg every  Saturday with extra 20 mEq of KCL on metolazone days. If SCr elevated substantially with this, consider metolazone every other week. BMET/BNP today. - Continue Coreg 6.25 mg bid.  - Continue hydralazine 37.5 mg tid and continue Imdur 60 mg daily. BP mildly elevated today, but has not had meds yet.  - If renal function remains stable, would start SGLT2 inhibitor (favor Jardiance). Plan to start  if gFR >25 today. - No ARNI/spironolactone for now with elevated creatinine.  - Encouraged her to send daily Cardiomems readings. 2. Chronic hypoxemic respiratory failure: Suspect OHS/OSA.  She was using home oxygen prior to COVID-19 PNA.  - Continue supplemental oxygen at night. - She is awaiting her CPAP titration. 3. Atrial fibrillation: Chronic, all ECGs since 7/21 show atrial fibrillation.  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 Rate is controlled. No overt bleeding.  - She cannot be cardioverted at this point as she cannot be chronically anticoagulated. - Evaluated by Dr. Quentin Ore for [place] but not a suitable candidate given multiple severe medical comorbidities and risks associated with general anesthesia. 4. GI bleeding: Recurrent, from small bowel AVMs. Unable to be anticoagulated. No recent overt bleeding.  - recent CBC showed hgb and plts remain low. Needs follow up with GI/hematology. She will call her GI provider and make an appt. 5. H/o DVT/PE: Provoked (post-surgery). No longer on anticoagulation due to h/o significant GIB. Has IVC filter.  6. CKD: Stage IV. Baseline SCr ~2.2. Follow closely with diuresis. She does not want dialysis. - Followed by Dr. Royce Macadamia.  - BMET today.  Follow up with Dr. Aundra Dubin in 3 months.  [place], [place] 01/04/2022

## 2022-01-04 NOTE — Patient Instructions (Signed)
Medication Changes:  Restart your Metolazone 2.5mg . Take every Saturday with an extra Potassium ( 1 Tablet)  Lab Work:  Labs done today, your results will be available in MyChart, we will contact you for abnormal readings.   Testing/Procedures:  none  Referrals:  none  Special Instructions // Education:  Do the following things EVERYDAY: Weigh yourself in the morning before breakfast. Write it down and keep it in a log. Take your medicines as prescribed Eat low salt foods--Limit salt (sodium) to 2000 mg per day.  Stay as active as you can everyday Limit all fluids for the day to less than 2 liters   Follow-Up in: 3 months  At the [facility], you and your health needs are our priority. We have a designated team specialized in the treatment of Heart Failure. This Care Team includes your primary Heart Failure Specialized Cardiologist (physician), Advanced Practice Providers (APPs- Physician Assistants and Nurse Practitioners), and Pharmacist who all work together to provide you with the care you need, when you need it.   You may see any of the following providers on your designated Care Team at your next follow up:  Dr Glori Bickers Dr Haynes Kerns, NP Lyda Jester, [place] [facility] [place], [place] Audry Riles, PharmD   Please be sure to bring in all your medications bottles to every appointment.   Need to Contact us:  If you have any questions or concerns before your next appointment please send [place] a message through [place] or call our office at [phone].    TO LEAVE A MESSAGE FOR THE NURSE SELECT OPTION 2, PLEASE LEAVE A MESSAGE INCLUDING: YOUR NAME DATE OF BIRTH CALL BACK NUMBER REASON FOR CALL**this is important as we prioritize the call backs  YOU WILL RECEIVE A CALL BACK THE SAME DAY AS LONG AS YOU CALL BEFORE 4:00 PM

## 2022-02-13 ENCOUNTER — Other Ambulatory Visit: Payer: Self-pay

## 2022-02-13 ENCOUNTER — Ambulatory Visit (HOSPITAL_BASED_OUTPATIENT_CLINIC_OR_DEPARTMENT_OTHER): Payer: PPO | Attending: Cardiology | Admitting: Cardiology

## 2022-02-13 ENCOUNTER — Encounter (INDEPENDENT_AMBULATORY_CARE_PROVIDER_SITE_OTHER): Payer: Self-pay

## 2022-02-13 DIAGNOSIS — I4891 Unspecified atrial fibrillation: Secondary | ICD-10-CM | POA: Insufficient documentation

## 2022-02-13 DIAGNOSIS — E669 Obesity, unspecified: Secondary | ICD-10-CM | POA: Diagnosis not present

## 2022-02-13 DIAGNOSIS — G4733 Obstructive sleep apnea (adult) (pediatric): Secondary | ICD-10-CM | POA: Diagnosis present

## 2022-02-13 DIAGNOSIS — R0683 Snoring: Secondary | ICD-10-CM | POA: Diagnosis not present

## 2022-02-13 DIAGNOSIS — I493 Ventricular premature depolarization: Secondary | ICD-10-CM | POA: Insufficient documentation

## 2022-02-13 DIAGNOSIS — Z6835 Body mass index (BMI) 35.0-35.9, adult: Secondary | ICD-10-CM | POA: Diagnosis not present

## 2022-02-13 DIAGNOSIS — E119 Type 2 diabetes mellitus without complications: Secondary | ICD-10-CM | POA: Insufficient documentation

## 2022-02-20 NOTE — Procedures (Signed)
? ?  Patient Name: Rita [name], Rita [name] ?Study Date: 02/13/2022 ?Gender: Female ?D.O.B: 1956-03-29 ?Age (years): 72 ?Referring Provider: Fransico Him MD, ABSM ?Height (inches): 61 ?Interpreting Physician: Fransico Him MD, ABSM ?Weight (lbs): 186 ?RPSGT: Jorge Ny ?BMI: 35 ?MRN: [id number] ?Neck Size: 15.00 ? ?CLINICAL INFORMATION ?The patient is referred for a CPAP titration to treat sleep apnea. ? ?SLEEP STUDY TECHNIQUE ?As per the AASM Manual for the Scoring of Sleep and Associated Events v2.3 (April 2016) with a hypopnea requiring 4% desaturations. ? ?The channels recorded and monitored were frontal, central and occipital EEG, electrooculogram (EOG), submentalis EMG (chin), nasal and oral airflow, thoracic and abdominal wall motion, anterior tibialis EMG, snore microphone, electrocardiogram, and pulse oximetry. Continuous positive airway pressure (CPAP) was initiated at the beginning of the study and titrated to treat sleep-disordered breathing. ? ?MEDICATIONS ?Medications self-administered by patient taken the night of the study : HYDRALAZINE ? ?TECHNICIAN COMMENTS ?Comments added by technician: Patient had difficulty initiating sleep. Patient was restless all through the night. ?Comments added by scorer: N/A ? ?RESPIRATORY PARAMETERS ?Optimal PAP Pressure (cm): 14  ?AHI at Optimal Pressure (/hr):2 ?Overall Minimal O2 (%):77.0  ?Supine % at Optimal Pressure (%):0 ?Minimal O2 at Optimal Pressure (%): 83.0  ? ?SLEEP ARCHITECTURE ?The study was initiated at 10:54:42 PM and ended at 5:21:36 AM. ? ?Sleep onset time was 114.7 minutes and the sleep efficiency was 60.0%. The total sleep time was 232.2 minutes. ? ?The patient spent 1.9% of the night in stage N1 sleep, 53.7% in stage N2 sleep, 30.1% in stage N3 and 14.2% in REM.Stage REM latency was 129.5 minutes ? ?Wake after sleep onset was 40.0. Alpha intrusion was absent. Supine sleep was 0.00%. ? ?CARDIAC DATA ?The 2 lead EKG demonstrated atrial fibrillation. The  mean heart rate was 63.7 beats per minute. Other EKG findings include: PVCs. ? ?LEG MOVEMENT DATA ?The total Periodic Limb Movements of Sleep (PLMS) were 0. The PLMS index was 0.0. A PLMS index of <15 is considered normal in adults. ? ?IMPRESSIONS ?- The optimal PAP pressure was 14 cm of water. ?- Severe oxygen desaturations were observed during this titration (min O2 = 77.0%). ?- The patient snored with moderate snoring volume during this titration study. ?- 2-lead EKG demonstrated: PVCs and atrial fibrillation ?- Clinically significant periodic limb movements were not noted during this study. Arousals associated with PLMs were rare. ? ?DIAGNOSIS ?- Obstructive Sleep Apnea (G47.33) ?- Nocturnal Hypoxemia ?- Atrial Fibrillation ? ?RECOMMENDATIONS ?- Trial of CPAP therapy on 14 cm H2O with a Small size Resmed Full Face AirFit F20 mask and heated humidification. ?- Avoid alcohol, sedatives and other CNS depressants that may worsen sleep apnea and disrupt normal sleep architecture. ?- Sleep hygiene should be reviewed to assess factors that may improve sleep quality. ?- Weight management and regular exercise should be initiated or continued. ?- Return to Sleep Center for re-evaluation after 6 weeks of therapy ? ?[Electronically signed] 02/20/2022 11:52 AM ? ?Fransico Him MD, ABSM ?Diplomate, Tax adviser of Sleep Medicine ? ? ?

## 2022-03-01 ENCOUNTER — Other Ambulatory Visit (HOSPITAL_COMMUNITY): Payer: Self-pay

## 2022-03-01 MED ORDER — HYDRALAZINE HCL 25 MG PO TABS: 37.5000 mg | ORAL_TABLET | Freq: Three times a day (TID) | ORAL | 3 refills | Status: DC

## 2022-03-02 ENCOUNTER — Telehealth: Payer: Self-pay | Admitting: *Deleted

## 2022-03-02 NOTE — Telephone Encounter (Addendum)
The patient has been notified of the result and verbalized understanding.  All questions (if any) were answered. ?Rita [name], [place] 03/02/2022 12:25 AM   ? ?Upon patient request DME selection is [facility] ?Patient understands he will be contacted by [place] [facility] to set up his cpap. ?Patient understands to call if [facility] does not contact him with new setup in a timely manner. ?Patient understands they will be called once confirmation has been received from [facility] that they have received their new machine to schedule 10 week follow up appointment. ?[facility] patient  notified of new cpap order  ?Please add to airview ?Patient was grateful for the call and thanked me.  ?

## 2022-03-02 NOTE — Telephone Encounter (Signed)
-----   Message from Lauralee Evener, [place] sent at 02/20/2022  1:25 PM EDT ----- ? ?----- Message ----- ?From: Sueanne Margarita, MD ?Sent: 02/20/2022  11:54 AM EDT ?To: Cv Div Sleep Studies ? ?Please let patient know that they had a successful PAP titration and let DME know that orders are in EPIC.  Please set up 6 week OV with me.  ?  ? ?

## 2022-04-04 ENCOUNTER — Telehealth (HOSPITAL_COMMUNITY): Payer: Self-pay | Admitting: Surgery

## 2022-04-04 ENCOUNTER — Encounter (HOSPITAL_COMMUNITY): Payer: Self-pay | Admitting: Cardiology

## 2022-04-04 ENCOUNTER — Ambulatory Visit (HOSPITAL_COMMUNITY)
Admission: RE | Admit: 2022-04-04 | Discharge: 2022-04-04 | Disposition: A | Discharge status: Home / Self Care | Payer: PPO | Source: Ambulatory Visit | Attending: Cardiology | Admitting: Cardiology

## 2022-04-04 VITALS — BP 140/68 | HR 69 | Wt 201.2 lb

## 2022-04-04 DIAGNOSIS — K219 Gastro-esophageal reflux disease without esophagitis: Secondary | ICD-10-CM | POA: Diagnosis not present

## 2022-04-04 DIAGNOSIS — E1122 Type 2 diabetes mellitus with diabetic chronic kidney disease: Secondary | ICD-10-CM | POA: Diagnosis not present

## 2022-04-04 DIAGNOSIS — J9611 Chronic respiratory failure with hypoxia: Secondary | ICD-10-CM | POA: Diagnosis not present

## 2022-04-04 DIAGNOSIS — Z853 Personal history of malignant neoplasm of breast: Secondary | ICD-10-CM | POA: Diagnosis not present

## 2022-04-04 DIAGNOSIS — Z86711 Personal history of pulmonary embolism: Secondary | ICD-10-CM | POA: Insufficient documentation

## 2022-04-04 DIAGNOSIS — N184 Chronic kidney disease, stage 4 (severe): Secondary | ICD-10-CM | POA: Insufficient documentation

## 2022-04-04 DIAGNOSIS — D649 Anemia, unspecified: Secondary | ICD-10-CM | POA: Insufficient documentation

## 2022-04-04 DIAGNOSIS — G4733 Obstructive sleep apnea (adult) (pediatric): Secondary | ICD-10-CM | POA: Diagnosis not present

## 2022-04-04 DIAGNOSIS — K922 Gastrointestinal hemorrhage, unspecified: Secondary | ICD-10-CM | POA: Insufficient documentation

## 2022-04-04 DIAGNOSIS — I4821 Permanent atrial fibrillation: Secondary | ICD-10-CM | POA: Diagnosis not present

## 2022-04-04 DIAGNOSIS — I4891 Unspecified atrial fibrillation: Secondary | ICD-10-CM | POA: Insufficient documentation

## 2022-04-04 DIAGNOSIS — E669 Obesity, unspecified: Secondary | ICD-10-CM | POA: Diagnosis not present

## 2022-04-04 DIAGNOSIS — Z79899 Other long term (current) drug therapy: Secondary | ICD-10-CM | POA: Diagnosis not present

## 2022-04-04 DIAGNOSIS — Z8616 Personal history of COVID-19: Secondary | ICD-10-CM | POA: Insufficient documentation

## 2022-04-04 DIAGNOSIS — Z86718 Personal history of other venous thrombosis and embolism: Secondary | ICD-10-CM | POA: Insufficient documentation

## 2022-04-04 DIAGNOSIS — I130 Hypertensive heart and chronic kidney disease with heart failure and stage 1 through stage 4 chronic kidney disease, or unspecified chronic kidney disease: Secondary | ICD-10-CM | POA: Insufficient documentation

## 2022-04-04 DIAGNOSIS — I5022 Chronic systolic (congestive) heart failure: Secondary | ICD-10-CM

## 2022-04-04 DIAGNOSIS — Z923 Personal history of irradiation: Secondary | ICD-10-CM | POA: Diagnosis not present

## 2022-04-04 LAB — BASIC METABOLIC PANEL
Anion gap: 11 (ref 5–15)
BUN: 75 mg/dL — ABNORMAL HIGH (ref 8–23)
CO2: 29 mmol/L (ref 22–32)
Calcium: 9.1 mg/dL (ref 8.9–10.3)
Chloride: 99 mmol/L (ref 98–111)
Creatinine, Ser: 2.83 mg/dL — ABNORMAL HIGH (ref 0.44–1.00)
GFR, Estimated: 18 mL/min — ABNORMAL LOW (ref >60)
Glucose, Bld: 173 mg/dL — ABNORMAL HIGH (ref 70–99)
Potassium: 3.6 mmol/L (ref 3.5–5.1)
Sodium: 139 mmol/L (ref 135–145)

## 2022-04-04 LAB — BRAIN NATRIURETIC PEPTIDE: B Natriuretic Peptide: 747.3 pg/mL — ABNORMAL HIGH (ref 0.0–100.0)

## 2022-04-04 MED ORDER — TORSEMIDE 20 MG PO TABS: ORAL_TABLET | ORAL | 3 refills | Status: DC

## 2022-04-04 MED ORDER — TORSEMIDE 20 MG PO TABS
ORAL_TABLET | ORAL | 3 refills | Status: DC
Start: 2022-04-04 — End: 2022-04-04

## 2022-04-04 NOTE — Patient Instructions (Signed)
Medication Changes: ? ?Increase Torsemide to 80 mg in the morning and 60 mg in the evening  ? ?Lab Work: ? ?Labs done today, your results will be available in MyChart, we will contact you for abnormal readings. ? ? ?Testing/Procedures: ? ?Repeat blood work in 10 days  ? ?Referrals: ? ?none ? ?Special Instructions // Education: ? ?PLEASE USE CARDIOMEMS DAILY  ? ?Follow-Up in: 3 weeks  ? ?At the [facility], you and your health needs are our priority. We have a designated team specialized in the treatment of Heart Failure. This Care Team includes your primary Heart Failure Specialized Cardiologist (physician), Advanced Practice Providers (APPs- Physician Assistants and Nurse Practitioners), and Pharmacist who all work together to provide you with the care you need, when you need it.  ? ?You may see any of the following providers on your designated Care Team at your next follow up: ? ?Dr Glori Bickers ?Dr Loralie Champagne ?Darrick Grinder, NP ?Lyda Jester, PA ?Jessica Milford,NP ?Marlyce Huge, PA ?Audry Riles, PharmD ? ? ?Please be sure to bring in all your medications bottles to every appointment.  ? ?Need to Contact us: ? ?If you have any questions or concerns before your next appointment please send [place] a message through [place] or call our office at [phone].   ? ?TO LEAVE A MESSAGE FOR THE NURSE SELECT OPTION 2, PLEASE LEAVE A MESSAGE INCLUDING: ?YOUR NAME ?DATE OF BIRTH ?CALL BACK NUMBER ?REASON FOR CALL**this is important as we prioritize the call backs ? ?YOU WILL RECEIVE A CALL BACK THE SAME DAY AS LONG AS YOU CALL BEFORE 4:00 PM ? ? ?

## 2022-04-04 NOTE — Telephone Encounter (Signed)
Patient contacted to review results and recommendations per Dr. Aundra Dubin.  She had a previous order for repeat labwork in 10 says that she wishes to keep. She is aware of medication changes and med list updated in [facility]. ?

## 2022-04-04 NOTE — Telephone Encounter (Signed)
-----   Message from Larey Dresser, MD sent at 04/04/2022 11:41 AM EDT ----- ?Creatinine higher but volume overloaded on exam.  Let's have her do torsemide 80 qam/40 qpm rather than 80 qam/60 qpm and get BMET in 1 week.  ?

## 2022-04-04 NOTE — Progress Notes (Signed)
? ?ADVANCED HEART FAILURE CLINIC NOTE ?PCP: Dr Bea Graff ?Nephrology: Dr. Royce Macadamia ?HF Cardiology: Dr Aundra Dubin  ? ?HPI: ?Rita [name] is a 66 y.o. 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  ?  ?She has had multiple hospitalizations in the last year.  She was admitted January 2021 for COVID-19 pneumonia w/ acute hypoxic respiratory failure.  She did not require intubation.  She was treated with steroids and remdesivir.  She was readmitted twice in 7/21 for CHF exacerbations complicated by pleural effusion requiring IR guided thoracentesis.  She was readmitted again 8/21 for recurrent CHF and diuresed with IV Lasix.  Echo 8/21 showed moderately reduced systolic function, EF 35 to 40% with mild concentric left ventricular hypertrophy and grade 2 diastolic dysfunction, mild hypokinesis of the LV apical segment, RV systolic function was normal.   ? ?Readmitted recurrent CHF exacerbation plus AKI on CKD in 9/21.  Previous creatinine last admit was 1.76.  Creatinine on this admission elevated at 2.06. Chest x-ray demonstrated cardiomegaly and pulmonary venous congestion with recurrent right-sided pleural effusion.  BNP 673.  CBC showed chronic anemia, hemoglobin 8.9.  Also with worsening thrombocytopenia.  Platelets down to 84K. Echo repeated with EF 40-45%, RV ok, PASP 51 mmHg. Placed on milrinone to support RV for diuresis. Discharge weight 225 pounds. She was discharged on bidil but too expensive. Changed to Imdur + hydral. Cardiac MRI was done this admission, showed LV EF 41%, RV EF 27%,  D-shaped septum, RV insertion site LGE (RV>LV failure), no evidence for cardiac amyloidosis.  ? ?She has seen EP for Watchman evaluation given inability to take anticoagulation.  Dr. Quentin Ore thought that she is not a candidate for left atrial appendage occlusion given the risks associated with general anesthesia. ? ?S/p Cardiomems 1/22. ? ?Echo in 1/23 showed EF 55-60%, mild LVH, mild RV enlargement with normal systolic function, IVC dilated, PASP 68 mmHg.  ? ?Patient returns for followup of CHF. Nephrology stopped her metolazone and decreased torsemide to 80 mg daily.  Weight is up about 5 lbs.  She is using CPAP.  She walks with a walker.  Mild dyspnea walking into the office today.  Generally does ok walking around her house.  No chest pain.  No orthopnea/PND.  BP borderline elevated.  ? ?ECG (personally reviewed): atrial fibrillation, right axis deviation.  ? ?Cardiomems PADP 19 (goal 21) ? ?Labs (11/21): K 4.5, creatinine 2.11 ?Labs (5/22): K 4.1, creatinine 2.48 ?Labs (7/22): K 4.8, creatinine 2.56 ?Labs (8/22): K 4.1, creatinine 1.89 ?Labs (9/22): K 4.2, creatinine 2.25 ?Labs (10/22): K 4.4, creatinine 2.48 ?Labs (11/22): K 4.1, creatinine 2.12 ?Labs (2/23): LDL 74, TGs 69, hgb 9.8, K 3.6, creatinine 2.85 ? ?PMH: ?1. Chronic systolic CHF:  ?- Echo 6/22 EF 35-40%, mod HK, G2DD, RV normal ?- Echo 9/21 EF 40-45%, global hypokinesis, D-shaped septum, at least mildly decreased RV systolic function.  ?- Cardiac MRI (9/21): LV EF 41%, diffuse hypokinesis, D-shaped septum, RV mildly dilated with RV EF 27%, nonspecific RV insertion site LGE.  No evidence for cardiac amyloidosis.  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  ?- RHC (10/21): mean RA 9, PA 54/17 mean 33,  mean PCWP 20, CI 3.33, PVR 1.9 WU ?- Cardiomems placed 12/2020.  RHC (1/22) with mean RA 14, PA 60/22, mean PCWP 20, CI 3.06, PVR 3.2.  ?- EF 55-60%, mild LVH, mild RV enlargement with normal systolic function, IVC dilated, PASP 68 mmHg.  ?2. CKD stage IV ?3. Type 2 diabetes ?4.  Pleural effusion s/p thoracentesis (due to CHF) ?5. HTN ?6. 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  ?7. COVID-19 infection ?8. Atrial fibrillation: Chronic.  Not anticoagulated due to chronic anemia and GI bleeding.  ?- Not Watchman candidate per EP.  ?9. Anemia: Suspect due to chronic low grade GI bleeding (small bowel AVMs).  ?10. OSA/OHS: Uses CPAP.  ?11. Left breast cancer: Treated remotely with chemo/radiation.  ?12. GERD ?13. Chronic mild thrombocytopenia ? ?ROS: All systems negative except as listed in HPI, PMH and Problem List. ? ?Social History  ? ?Socioeconomic History  ? Marital status: Married  ?  Spouse name: Not on file  ? Number of children: Not on file  ? Years of education: Not on file  ? Highest education level: Not on file  ?Occupational History  ? Not on file  ?Tobacco Use  ? Smoking status: Never  ? Smokeless tobacco: Never  ?Vaping Use  ? Vaping Use: Never used  ?Substance and Sexual Activity  ? Alcohol use: Never  ? Drug use: Never  ? Sexual activity: Not on file  ?Other Topics Concern  ? Not on file  ?Social History Narrative  ? Not on file  ? ?Social Determinants of Health  ? ?Financial Resource Strain: Not on file  ?Food Insecurity: Not on file  ?Transportation Needs: Not on file  ?Physical Activity: Not on file  ?Stress: Not on file  ?Social Connections: Not on file  ?Intimate Partner Violence: Not on file  ? ?Family History  ?Problem Relation Age of Onset  ? Cancer Mother   ? ?Current Outpatient Medications  ?Medication Sig Dispense Refill  ? acetaminophen (TYLENOL) 325 MG tablet Take 650 mg by mouth every 6 (six) hours as needed for mild pain or headache.    ? aspirin EC 81 MG tablet Take 81 mg by mouth daily. Swallow whole.    ? blood glucose meter kit and supplies Dispense based on patient and insurance preference. Use up to four times daily as directed. (FOR ICD-10 E10.9, E11.9). 1 each 0  ? Blood Glucose Monitoring Suppl (FIFTY50 GLUCOSE METER 2.0) w/Device KIT See admin instructions.    ?  carvedilol (COREG) 6.25 MG tablet Take 1 tablet (6.25 mg total) by mouth 2 (two) times daily with a meal. 60 tablet 6  ? glipiZIDE (GLUCOTROL XL) 5 MG 24 hr tablet Take 5 mg by mouth daily with breakfast.     ? hydrALAZINE (APRESOLINE) 25 MG tablet Take 1.5 tablets (37.5 mg total) by mouth 3 (three) times daily. 135 tablet 3  ? isosorbide mononitrate (IMDUR) 60 MG 24 hr tablet Take 1 tablet (60 mg total) by mouth daily. Needs appointment for further refill 90 tablet 0  ? Menthol-Methyl Salicylate (MUSCLE RUB) 10-15 % CREA Apply 1 application topically as needed for muscle pain (knees).     ? Multiple Vitamin (MULTIVITAMIN WITH MINERALS) TABS tablet Take 2 tablets by mouth daily. Alive gummy bears    ? Multiple Vitamins-Minerals (VITAMIN D3 COMPLETE PO) Take 25 mcg by mouth daily.    ? OXYGEN Inhale 2 L into the lungs at bedtime. As needed Uses CPAP _0     ? pantoprazole (PROTONIX)  40 MG tablet Take 40 mg by mouth as needed.    ? polyethylene glycol (MIRALAX / GLYCOLAX) 17 g packet Take 17 g by mouth as needed.    ? potassium chloride SA (KLOR-CON M) 20 MEQ tablet Take 20 mEq by mouth daily.    ? metolazone (ZAROXOLYN) 2.5 MG tablet Take 1 tablet (2.5 mg total) by mouth once a week. On Saturdays with an extra potassium tablet (Patient not taking: Reported on 04/04/2022) 10 tablet 3  ? torsemide (DEMADEX) 20 MG tablet Take 4 tablets (80 mg total) by mouth every morning AND 2 tablets (40 mg total) every evening. 588 tablet 3  ? ?No current facility-administered medications for this encounter.  ? ?BP 140/68   Pulse 69   Wt 91.3 kg (201 lb 3.2 oz)   SpO2 97%   BMI 38.02 kg/m?  ? ?Wt Readings from Last 3 Encounters:  ?04/04/22 91.3 kg (201 lb 3.2 oz)  ?02/13/22 84.4 kg (186 lb)  ?01/04/22 88.9 kg (196 lb)  ? ?PHYSICAL EXAM: ?General: NAD ?Neck: JVP 10-12 cm, no thyromegaly or thyroid nodule.  ?Lungs: Clear to auscultation bilaterally with normal respiratory effort. ?CV: Nondisplaced PMI.  Heart regular S1/S2, no S3/S4,  1/6 SEM RUSB.  No peripheral edema.  No carotid bruit.  Normal pedal pulses.  ?Abdomen: Soft, nontender, no hepatosplenomegaly, no distention.  ?Skin: Intact without lesions or rashes.  ?Neurologic: Alert and orien

## 2022-04-19 ENCOUNTER — Other Ambulatory Visit: Payer: Self-pay | Admitting: Cardiology

## 2022-04-23 ENCOUNTER — Other Ambulatory Visit: Payer: Self-pay | Admitting: Cardiology

## 2022-04-24 LAB — BASIC METABOLIC PANEL
BUN/Creatinine Ratio: 26 (ref 12–28)
BUN: 70 mg/dL — ABNORMAL HIGH (ref 8–27)
CO2: 27 mmol/L (ref 20–29)
Calcium: 9.4 mg/dL (ref 8.7–10.3)
Chloride: 94 mmol/L — ABNORMAL LOW (ref 96–106)
Creatinine, Ser: 2.66 mg/dL — ABNORMAL HIGH (ref 0.57–1.00)
Glucose: 189 mg/dL — ABNORMAL HIGH (ref 70–99)
Potassium: 3.3 mmol/L — ABNORMAL LOW (ref 3.5–5.2)
Sodium: 138 mmol/L (ref 134–144)
eGFR: 19 mL/min/{1.73_m2} — ABNORMAL LOW (ref >59)

## 2022-04-24 LAB — SPECIMEN STATUS REPORT

## 2022-04-24 NOTE — Progress Notes (Incomplete)
? ?ADVANCED HEART FAILURE CLINIC NOTE ?PCP: Dr Bea Graff ?Nephrology: Dr. Royce Macadamia ?HF Cardiology: Dr Aundra Dubin  ? ?HPI: ?[name] is a 66 y.o. 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  ?  ?She has had multiple hospitalizations in the last year.  She was admitted January 2021 for COVID-19 pneumonia w/ acute hypoxic respiratory failure.  She did not require intubation.  She was treated with steroids and remdesivir.  She was readmitted twice in 7/21 for CHF exacerbations complicated by pleural effusion requiring IR guided thoracentesis.  She was readmitted again 8/21 for recurrent CHF and diuresed with IV Lasix.  Echo 8/21 showed moderately reduced systolic function, EF 35 to 40% with mild concentric left ventricular hypertrophy and grade 2 diastolic dysfunction, mild hypokinesis of the LV apical segment, RV systolic function was normal.   ? ?Readmitted recurrent CHF exacerbation plus AKI on CKD in 9/21.  Previous creatinine last admit was 1.76.  Creatinine on this admission elevated at 2.06. Chest x-ray demonstrated cardiomegaly and pulmonary venous congestion with recurrent right-sided pleural effusion.  BNP 673.  CBC showed chronic anemia, hemoglobin 8.9.  Also with worsening thrombocytopenia.  Platelets down to 84K. Echo repeated with EF 40-45%, RV ok, PASP 51 mmHg. Placed on milrinone to support RV for diuresis. Discharge weight 225 pounds. She was discharged on bidil but too expensive. Changed to Imdur + hydral. Cardiac MRI was done this admission, showed LV EF 41%, RV EF 27%,  D-shaped septum, RV insertion site LGE (RV>LV failure), no evidence for cardiac amyloidosis.  ? ?She has seen EP for Watchman evaluation given inability to take anticoagulation.  Dr. Quentin Ore thought that she is not a candidate for left atrial appendage occlusion given the risks associated with general anesthesia. ? ?S/p Cardiomems 1/22. ? ?Echo in 1/23 showed EF 55-60%, mild LVH, mild RV enlargement with normal systolic function, IVC dilated, PASP 68 mmHg.  ? ?Patient seen for HF f/u 04/23. Nephrology had stopped her metolazone and decreased torsemide to 80 mg daily.  Weight up about 5 lbs.  Torsemide increased to 80 qam/40 qpm.  ? ?ECG (personally reviewed):  ? ?Cardiomems PADP *** (goal 21) ? ?Labs (11/21): K 4.5, creatinine 2.11 ?Labs (5/22): K 4.1, creatinine 2.48 ?Labs (7/22): K 4.8, creatinine 2.56 ?Labs (8/22): K 4.1, creatinine 1.89 ?Labs (9/22): K 4.2, creatinine 2.25 ?Labs (10/22): K 4.4, creatinine 2.48 ?Labs (11/22): K 4.1, creatinine 2.12 ?Labs (2/23): LDL 74, TGs 69, hgb 9.8, K 3.6, creatinine 2.85 ? ?PMH: ?1. Chronic systolic CHF:  ?- Echo 5/02 EF 35-40%, mod HK, G2DD, RV normal ?- Echo 9/21 EF 40-45%, global hypokinesis, D-shaped septum, at least mildly decreased RV systolic function.  ?- Cardiac MRI (9/21): LV EF 41%, diffuse hypokinesis, D-shaped septum, RV mildly dilated with RV EF 27%, nonspecific RV insertion site LGE.  No evidence for cardiac amyloidosis.  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  ?- RHC (10/21): mean RA 9, PA 54/17 mean 33, mean PCWP 20, CI 3.33, PVR 1.9 WU ?- Cardiomems placed 12/2020.  RHC (1/22) with mean RA 14, PA 60/22, mean PCWP 20, CI 3.06, PVR 3.2.  ?- EF 55-60%, mild LVH, mild RV  enlargement with normal systolic function, IVC dilated, PASP 68 mmHg.  ?2. CKD stage IV ?3. Type 2 diabetes ?4. Pleural effusion s/p thoracentesis (due to CHF) ?5. HTN ?6. 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  ?7. COVID-19 infection ?8. Atrial fibrillation: Chronic.  Not anticoagulated due to chronic anemia and GI bleeding.  ?-  Not Watchman candidate per EP.  ?9. Anemia: Suspect due to chronic low grade GI bleeding (small bowel AVMs).  ?10. OSA/OHS: Uses CPAP.  ?11. Left breast cancer: Treated remotely with chemo/radiation.  ?12. GERD ?13. Chronic mild thrombocytopenia ? ?ROS: All systems negative except as listed in HPI, PMH and Problem List. ? ?Social History  ? ?Socioeconomic History  ? Marital status: Married  ?  Spouse name: Not on file  ? Number of children: Not on file  ? Years of education: Not on file  ? Highest education level: Not on file  ?Occupational History  ? Not on file  ?Tobacco Use  ? Smoking status: Never  ? Smokeless tobacco: Never  ?Vaping Use  ? Vaping Use: Never used  ?Substance and Sexual Activity  ? Alcohol use: Never  ? Drug use: Never  ? Sexual activity: Not on file  ?Other Topics Concern  ? Not on file  ?Social History Narrative  ? Not on file  ? ?Social Determinants of Health  ? ?Financial Resource Strain: Not on file  ?Food Insecurity: Not on file  ?Transportation Needs: Not on file  ?Physical Activity: Not on file  ?Stress: Not on file  ?Social Connections: Not on file  ?Intimate Partner Violence: Not on file  ? ?Family History  ?Problem Relation Age of Onset  ? Cancer Mother   ? ?Current Outpatient Medications  ?Medication Sig Dispense Refill  ? acetaminophen (TYLENOL) 325 MG tablet Take 650 mg by mouth every 6 (six) hours as needed for mild pain or headache.    ? aspirin EC 81 MG tablet Take 81 mg by mouth daily. Swallow whole.    ? blood glucose meter kit and supplies Dispense based on patient and insurance preference. Use up to four times daily as directed. (FOR ICD-10 E10.9, E11.9). 1 each 0  ? Blood Glucose Monitoring Suppl (FIFTY50 GLUCOSE METER 2.0) w/Device KIT See admin instructions.    ? carvedilol (COREG) 6.25 MG tablet Take 1 tablet (6.25 mg total) by mouth 2 (two) times daily with a meal. 60 tablet 6  ? glipiZIDE (GLUCOTROL XL) 5 MG 24 hr tablet Take 5 mg by mouth daily with breakfast.     ?  hydrALAZINE (APRESOLINE) 25 MG tablet Take 1.5 tablets (37.5 mg total) by mouth 3 (three) times daily. 135 tablet 3  ? isosorbide mononitrate (IMDUR) 60 MG 24 hr tablet Take 1 tablet (60 mg total) by mouth daily. Needs appointment for further refill 90 tablet 0  ? Menthol-Methyl Salicylate (MUSCLE RUB) 10-15 % CREA Apply 1 application topically as needed for muscle pain (knees).     ? metolazone (ZAROXOLYN) 2.5 MG tablet Take 1 tablet (2.5 mg total) by mouth once a week. On Saturdays with an extra potassium tablet (Patient not taking: Reported on 04/04/2022) 10 tablet 3  ? Multiple Vitamin (MULTIVITAMIN WITH MINERALS) TABS tablet Take 2 tablets by mouth daily. Alive gummy bears    ? Multiple Vitamins-Minerals (VITAMIN D3 COMPLETE PO) Take 25 mcg by mouth daily.    ? OXYGEN Inhale 2 L into the lungs at bedtime. As needed Uses CPAP $[id number]$     ? pantoprazole (PROTONIX) 40 MG  tablet Take 40 mg by mouth as needed.    ? polyethylene glycol (MIRALAX / GLYCOLAX) 17 g packet Take 17 g by mouth as needed.    ? potassium chloride SA (KLOR-CON M) 20 MEQ tablet Take 20 mEq by mouth daily.    ? torsemide (DEMADEX) 20 MG tablet Take 4 tablets (80 mg total) by mouth every morning AND 2 tablets (40 mg total) every evening. 588 tablet 3  ? ?No current facility-administered medications for this visit.  ? ?There were no vitals taken for this visit. ? ?Wt Readings from Last 3 Encounters:  ?04/04/22 91.3 kg (201 lb 3.2 oz)  ?02/13/22 84.4 kg (186 lb)  ?01/04/22 88.9 kg (196 lb)  ? ?PHYSICAL EXAM: ?General:  Well appearing. No resp difficulty ?HEENT: normal ?Neck: supple. no JVD. Carotids 2+ bilat; no bruits. No lymphadenopathy or thryomegaly appreciated. ?Cor: PMI nondisplaced. Regular rate & rhythm. No rubs, gallops or murmurs. ?Lungs: clear ?Abdomen: soft, nontender, nondistended. No hepatosplenomegaly. No bruits or masses. Good bowel sounds. ?Extremities: no cyanosis, clubbing, rash, edema ?Neuro: alert & orientedx3, cranial nerves grossly  intact. moves all 4 extremities w/o difficulty. Affect pleasant ? ? ? ?ASSESSMENT & PLAN: ?1.  Chronic systolic CHF: Prominent signs of RV failure.  9/21 echo showed EF 40-45%, global hypokinesis, D-shap

## 2022-04-25 ENCOUNTER — Encounter (HOSPITAL_COMMUNITY): Payer: PPO

## 2022-05-20 IMAGING — DX DG CHEST 1V PORT
1 series · 1 of 1 positions shown · non-contrast
Comparison: Chest x-ray dated June 28, 2020.

CLINICAL DATA: Shortness of breath and lower extremity edema.

EXAM:
PORTABLE CHEST 1 VIEW

[chest]
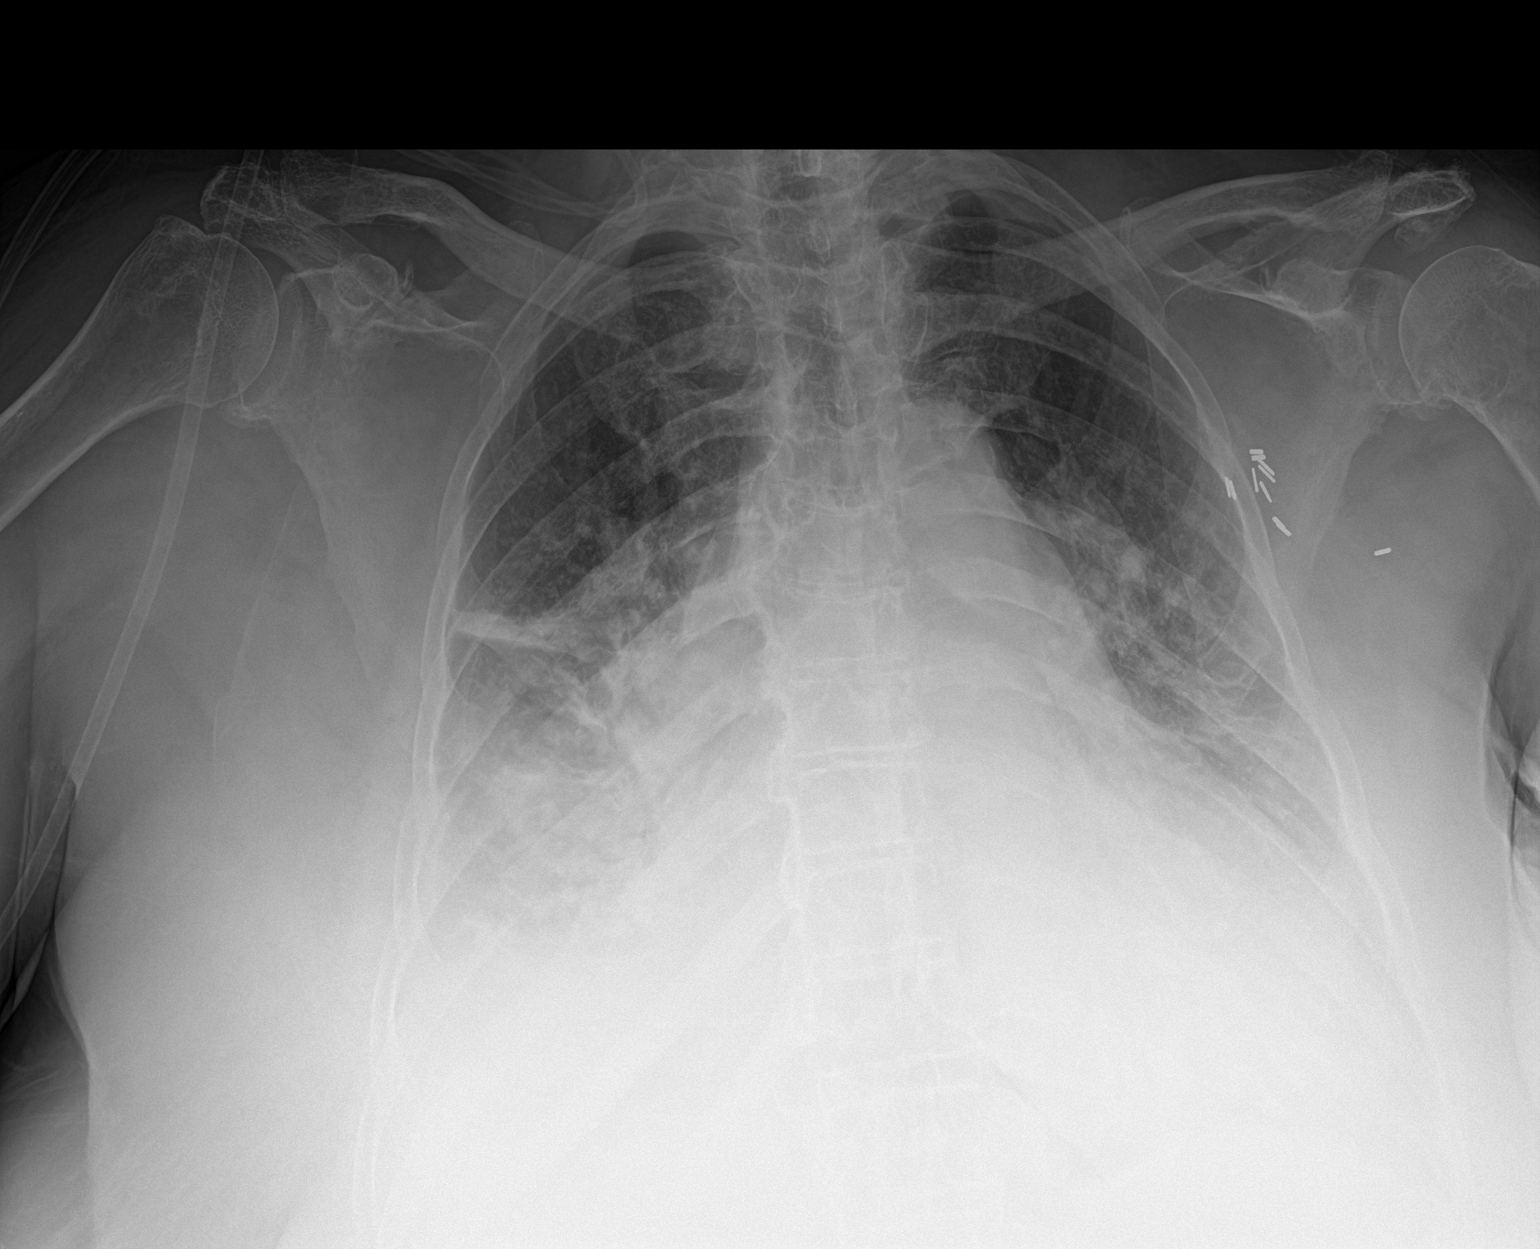

[1 of 1 positions shown; findings below may reference images not displayed]

FINDINGS: Stable cardiomegaly. Worsening bilateral mid and lower lung
interstitial thickening and atelectasis. Increasing bilateral
layering pleural effusions. No pneumothorax. No acute osseous
abnormality. Surgical clips in the left axilla.
IMPRESSION: 1. Worsening pulmonary edema and bilateral pleural effusions.

## 2022-05-22 IMAGING — US US ABDOMEN COMPLETE
1 series · 13 of 25 positions shown · non-contrast
Comparison: 05/29/2005

CLINICAL DATA: Thrombocytopenia

EXAM:
ABDOMEN ULTRASOUND COMPLETE

[Series 1: us abdomen complete · 13 of 168 slices shown]
[im 1/168]
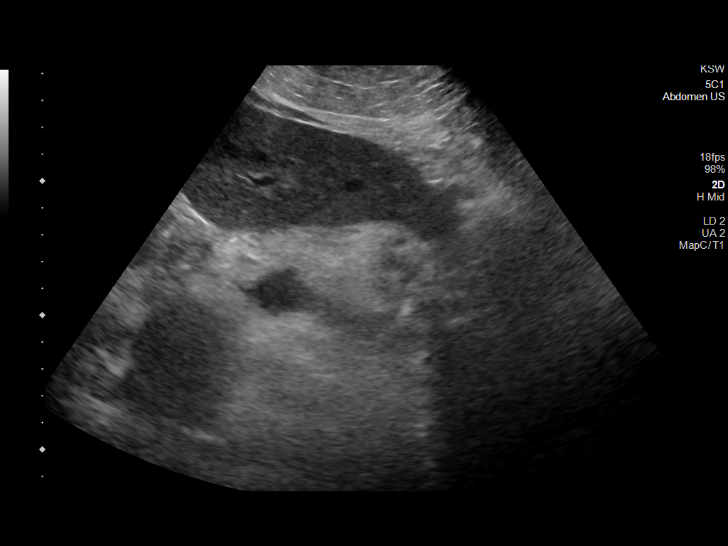
[im 14/168]
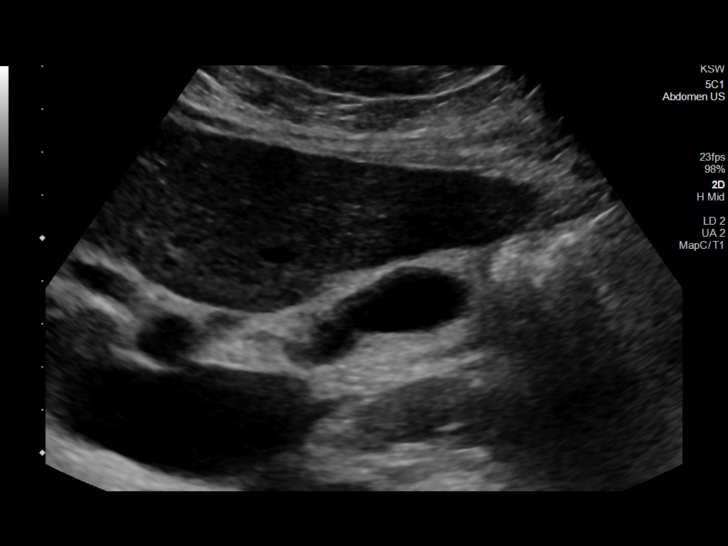
[im 28/168]
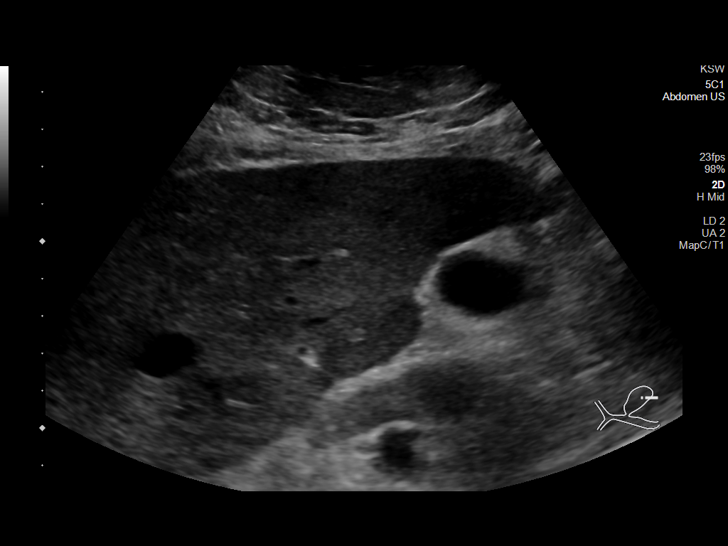
[im 42/168]
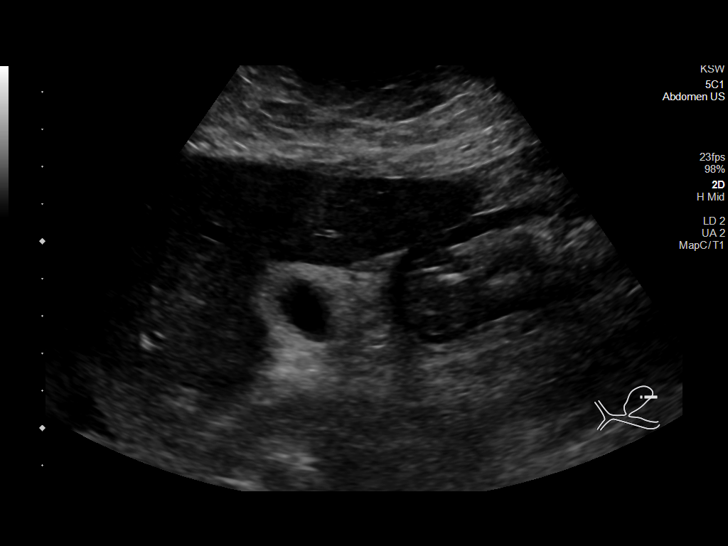
[im 56/168]
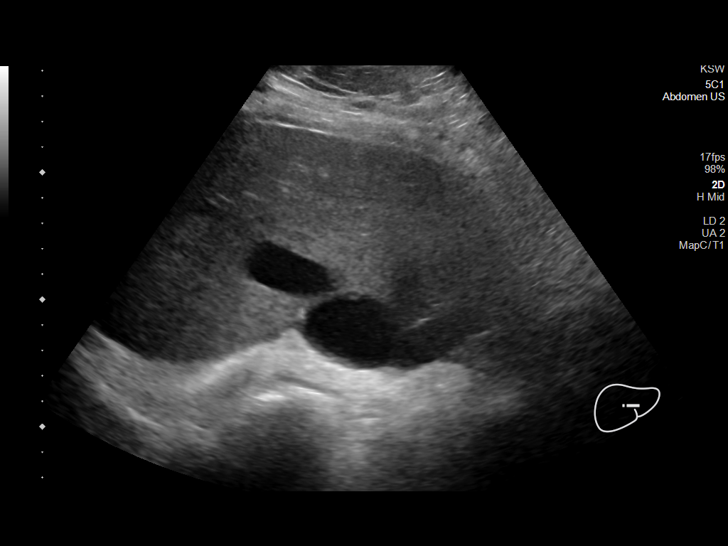
[im 70/168]
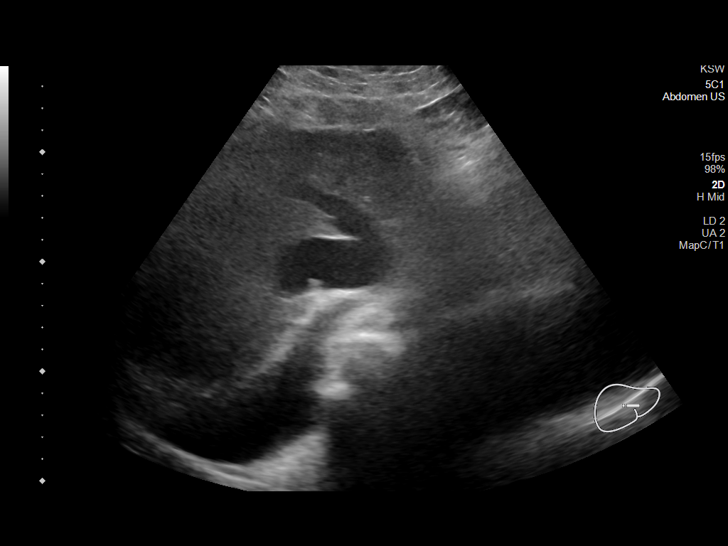
[im 84/168]
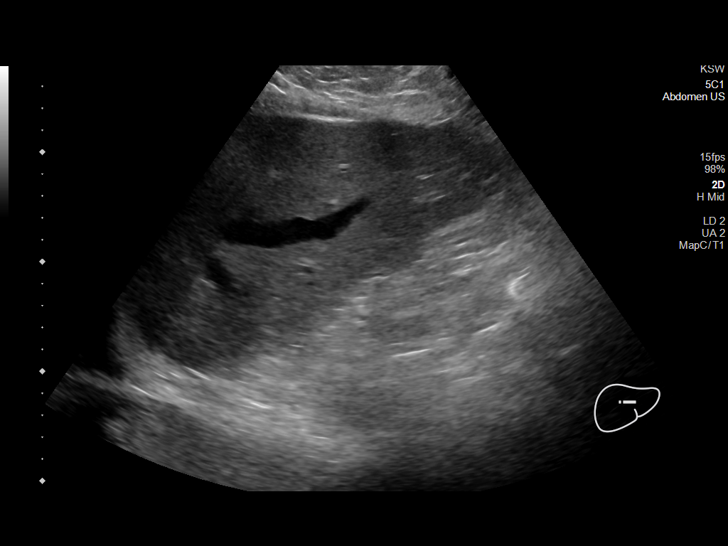
[im 98/168]
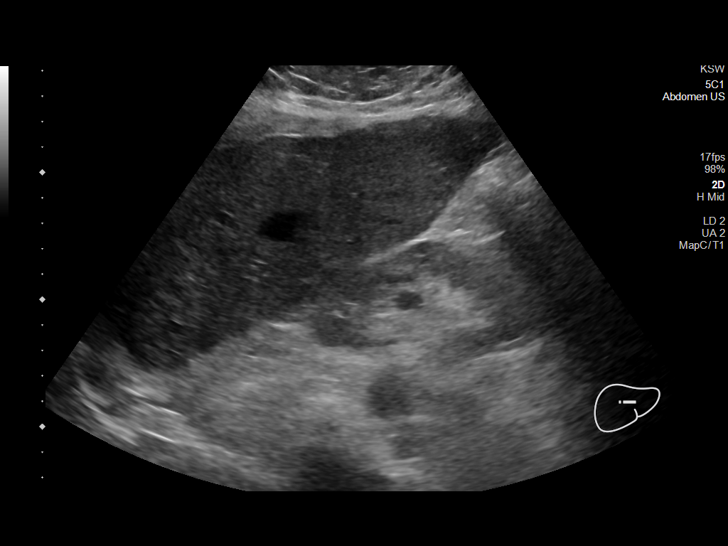
[im 112/168]
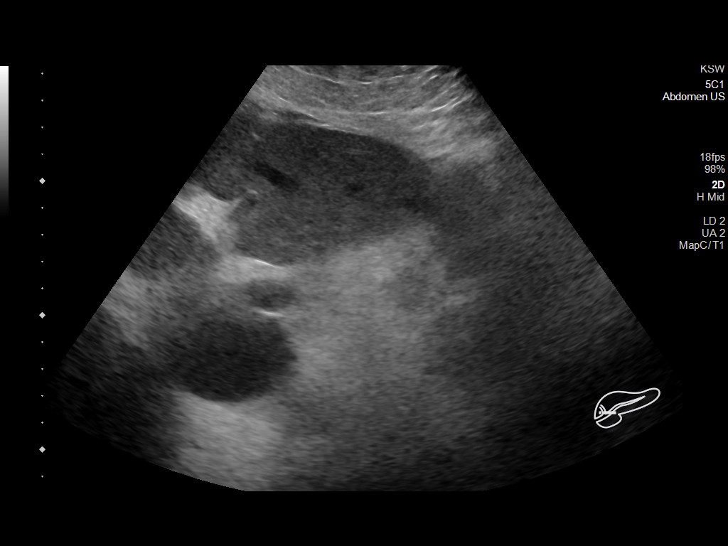
[im 126/168]
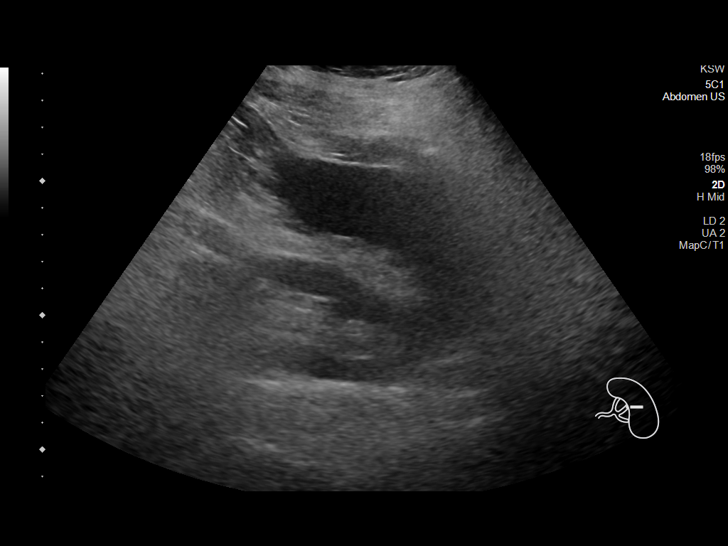
[im 140/168]
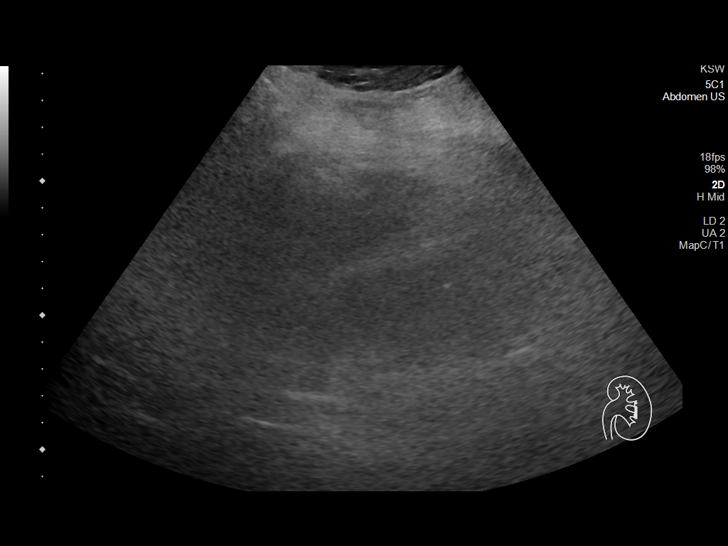
[im 154/168]
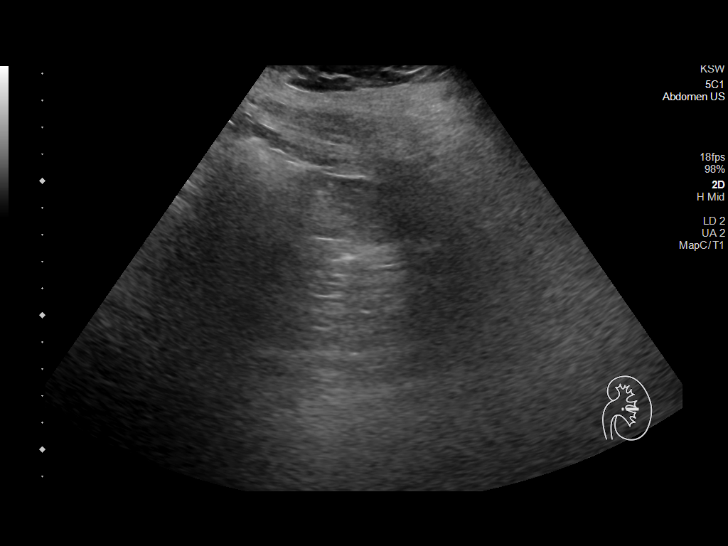
[im 168/168]
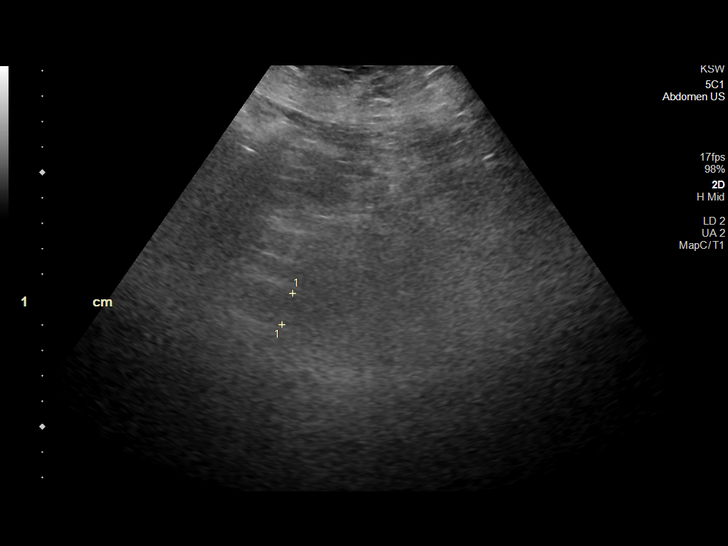

[13 of 25 positions shown; findings below may reference images not displayed]

FINDINGS: Gallbladder: No gallstones or wall thickening visualized. Apparent
gallbladder wall thickening is related to prominent pericholecystic
fat. No sonographic Murphy sign noted by sonographer.

Common bile duct: Diameter: 3 mm

Liver: No focal lesion identified. Within normal limits in
parenchymal echogenicity. The portal vein is patent with antegrade
flow demonstrating a prominent sinusoidal waveform. Additionally the
inferior vena cava appears capacious, as do the hepatic veins.
Together, the findings are suggestive of at least some degree of
right heart failure or potentially tricuspid regurgitation.

IVC: Capacious, as described above. Patent with antegrade flow,
however.

Pancreas: Visualized portion unremarkable.

Spleen: Size and appearance within normal limits.

Right Kidney: Length: 11.3 cm. Echogenicity within normal limits. No
mass or hydronephrosis visualized.

Left Kidney: Length: 11.1 cm. Echogenicity within normal limits. No
mass or hydronephrosis visualized.

Abdominal aorta: No aneurysm visualized.

Other findings: No ascites
IMPRESSION: No evidence of splenomegaly.

Sinusoidal waveform within the portal vein and dilation of the
intrahepatic inferior vena cava and central hepatic veins suggesting
some degree of right heart failure or tricuspid regurgitation.

## 2022-05-28 ENCOUNTER — Ambulatory Visit (HOSPITAL_COMMUNITY)
Admission: RE | Admit: 2022-05-28 | Discharge: 2022-05-28 | Disposition: A | Discharge status: Home / Self Care | Payer: PPO | Source: Ambulatory Visit | Attending: Family Medicine | Admitting: Family Medicine

## 2022-05-28 ENCOUNTER — Encounter (HOSPITAL_COMMUNITY): Payer: Self-pay

## 2022-05-28 VITALS — BP 120/78 | HR 71 | Ht 61.0 in | Wt 205.4 lb

## 2022-05-28 DIAGNOSIS — J9611 Chronic respiratory failure with hypoxia: Secondary | ICD-10-CM | POA: Diagnosis not present

## 2022-05-28 DIAGNOSIS — Z8616 Personal history of COVID-19: Secondary | ICD-10-CM | POA: Insufficient documentation

## 2022-05-28 DIAGNOSIS — E669 Obesity, unspecified: Secondary | ICD-10-CM | POA: Diagnosis not present

## 2022-05-28 DIAGNOSIS — Z86718 Personal history of other venous thrombosis and embolism: Secondary | ICD-10-CM | POA: Diagnosis not present

## 2022-05-28 DIAGNOSIS — Z86711 Personal history of pulmonary embolism: Secondary | ICD-10-CM | POA: Insufficient documentation

## 2022-05-28 DIAGNOSIS — Z8719 Personal history of other diseases of the digestive system: Secondary | ICD-10-CM

## 2022-05-28 DIAGNOSIS — M179 Osteoarthritis of knee, unspecified: Secondary | ICD-10-CM | POA: Insufficient documentation

## 2022-05-28 DIAGNOSIS — Z7984 Long term (current) use of oral hypoglycemic drugs: Secondary | ICD-10-CM | POA: Diagnosis not present

## 2022-05-28 DIAGNOSIS — I4819 Other persistent atrial fibrillation: Secondary | ICD-10-CM | POA: Diagnosis not present

## 2022-05-28 DIAGNOSIS — Z6838 Body mass index (BMI) 38.0-38.9, adult: Secondary | ICD-10-CM | POA: Insufficient documentation

## 2022-05-28 DIAGNOSIS — Z79899 Other long term (current) drug therapy: Secondary | ICD-10-CM | POA: Insufficient documentation

## 2022-05-28 DIAGNOSIS — D649 Anemia, unspecified: Secondary | ICD-10-CM | POA: Insufficient documentation

## 2022-05-28 DIAGNOSIS — K219 Gastro-esophageal reflux disease without esophagitis: Secondary | ICD-10-CM | POA: Diagnosis not present

## 2022-05-28 DIAGNOSIS — Z9981 Dependence on supplemental oxygen: Secondary | ICD-10-CM | POA: Diagnosis not present

## 2022-05-28 DIAGNOSIS — I4821 Permanent atrial fibrillation: Secondary | ICD-10-CM

## 2022-05-28 DIAGNOSIS — J90 Pleural effusion, not elsewhere classified: Secondary | ICD-10-CM | POA: Insufficient documentation

## 2022-05-28 DIAGNOSIS — E1122 Type 2 diabetes mellitus with diabetic chronic kidney disease: Secondary | ICD-10-CM | POA: Insufficient documentation

## 2022-05-28 DIAGNOSIS — I5022 Chronic systolic (congestive) heart failure: Secondary | ICD-10-CM | POA: Insufficient documentation

## 2022-05-28 DIAGNOSIS — I130 Hypertensive heart and chronic kidney disease with heart failure and stage 1 through stage 4 chronic kidney disease, or unspecified chronic kidney disease: Secondary | ICD-10-CM | POA: Diagnosis not present

## 2022-05-28 DIAGNOSIS — N184 Chronic kidney disease, stage 4 (severe): Secondary | ICD-10-CM | POA: Diagnosis not present

## 2022-05-28 LAB — BRAIN NATRIURETIC PEPTIDE: B Natriuretic Peptide: 722.5 pg/mL — ABNORMAL HIGH (ref 0.0–100.0)

## 2022-05-28 LAB — BASIC METABOLIC PANEL
Anion gap: 12 (ref 5–15)
BUN: 57 mg/dL — ABNORMAL HIGH (ref 8–23)
CO2: 31 mmol/L (ref 22–32)
Calcium: 9.6 mg/dL (ref 8.9–10.3)
Chloride: 99 mmol/L (ref 98–111)
Creatinine, Ser: 2.78 mg/dL — ABNORMAL HIGH (ref 0.44–1.00)
GFR, Estimated: 18 mL/min — ABNORMAL LOW (ref >60)
Glucose, Bld: 144 mg/dL — ABNORMAL HIGH (ref 70–99)
Potassium: 4.4 mmol/L (ref 3.5–5.1)
Sodium: 142 mmol/L (ref 135–145)

## 2022-05-28 MED ORDER — TORSEMIDE 20 MG PO TABS: 100.0000 mg | ORAL_TABLET | Freq: Two times a day (BID) | ORAL | 3 refills | Status: DC

## 2022-05-28 NOTE — Patient Instructions (Addendum)
INCREASE Torsemide to 100 mg, twice a day Take Metolazone 2.5 mg today 05/28/22 with 40 meq of potassium.   Labs today We will only contact you if something comes back abnormal or we need to make some changes. Otherwise no news is good news!  Labs needed in 7 days  Be sure to submit cardiomems daily   Do the following things EVERYDAY: Weigh yourself in the morning before breakfast. Write it down and keep it in a log. Take your medicines as prescribed Eat low salt foods--Limit salt (sodium) to 2000 mg per day.  Stay as active as you can everyday Limit all fluids for the day to less than 2 liters  At the [facility], you and your health needs are our priority. As part of our continuing mission to provide you with exceptional heart care, we have created designated Provider Care Teams. These Care Teams include your primary Cardiologist (physician) and Advanced Practice Providers (APPs- Physician Assistants and Nurse Practitioners) who all work together to provide you with the care you need, when you need it.   You may see any of the following providers on your designated Care Team at your next follow up: Dr Glori Bickers Dr Haynes Kerns, NP Lyda Jester, [place] [facility] [place], [place] Audry Riles, PharmD   Please be sure to bring in all your medications bottles to every appointment.

## 2022-05-28 NOTE — Addendum Note (Signed)
Encounter addended by: Kerry Dory, CMA on: 05/28/2022 9:42 AM  Actions taken: Clinical Note Signed

## 2022-05-28 NOTE — Progress Notes (Signed)
Advanced Heart Failure Clinic Note PCP: Dr Bea Graff Nephrology: Dr. Royce Macadamia HF Cardiology: Dr Aundra Dubin   HPI: Rita [name] is a 66 y.o. 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    She has had multiple hospitalizations in the last year.  She was admitted January 2021 for COVID-19 pneumonia w/ acute hypoxic respiratory failure.  She did not require intubation.  She was treated with steroids and remdesivir.  She was readmitted twice in 7/21 for CHF exacerbations complicated by pleural effusion requiring IR guided thoracentesis.  She was readmitted again 8/21 for recurrent CHF and diuresed with IV Lasix.  Echo 8/21 showed moderately reduced systolic function, EF 35 to 40% with mild concentric left ventricular hypertrophy and grade 2 diastolic dysfunction, mild hypokinesis of the LV apical segment, RV systolic function was normal.    Readmitted recurrent CHF exacerbation plus AKI on CKD in 9/21.  Previous creatinine last admit was 1.76.  Creatinine on this admission elevated at 2.06. Chest x-ray demonstrated cardiomegaly and pulmonary venous congestion with recurrent right-sided pleural effusion.  BNP 673.  CBC showed chronic anemia, hemoglobin 8.9.  Also with worsening thrombocytopenia.  Platelets down to 84K. Echo repeated with EF 40-45%, RV ok, PASP 51 mmHg. Placed on milrinone to support RV for diuresis. Discharge weight 225 pounds. She was discharged on bidil but too expensive. Changed to Imdur + hydral. Cardiac MRI was done this admission, showed LV EF 41%, RV EF 27%,  D-shaped septum, RV insertion site LGE (RV>LV failure), no evidence for cardiac amyloidosis.   She has seen EP for Watchman evaluation given inability to take anticoagulation.  Dr. Quentin Ore thought that she is not a candidate for left atrial appendage occlusion given the risks associated with general anesthesia.  S/p Cardiomems 1/22.  Echo in 1/23 showed EF 55-60%, mild LVH, mild RV enlargement with normal systolic function, IVC dilated, PASP 68 mmHg.   Follow up 4/23, NYHA III, volume overloaded on exam. Torsemide increased to 80/40. Eventually, metolazone added back weekly.  Today she returns for HF follow up with her husband. Overall feeling more short of breath. Had dietary indiscretion over Father's Day weekend. Continues to be limited by knee OA, uses a walker around the house. Wears 2L oxygen. Denies palpitations, CP, dizziness, edema, or PND/Orthopnea. Appetite ok. No fever or chills. Weight at home 198 pounds. Taking all medications. Wearing CPAP.  ECG (personally reviewed): none ordered today.  Cardiomems PADP 25 (goal 21)  Labs (11/21): K 4.5, creatinine 2.11 Labs (5/22): K 4.1, creatinine 2.48 Labs (7/22): K 4.8, creatinine 2.56 Labs (8/22): K 4.1, creatinine 1.89 Labs (9/22): K 4.2, creatinine 2.25 Labs (10/22): K 4.4, creatinine 2.48 Labs (11/22): K 4.1, creatinine 2.12 Labs (2/23): LDL 74, TGs 69, hgb 9.8, K 3.6, creatinine 2.85 Labs (5/23): K 3.3, creatinine 2.66  PMH: 1. Chronic systolic CHF:  - Echo 9/93 EF 35-40%, mod HK, G2DD, RV normal - Echo 9/21 EF 40-45%, global hypokinesis, D-shaped septum, at least mildly decreased RV systolic function.  - Cardiac MRI (9/21): LV EF 41%, diffuse hypokinesis, D-shaped septum, RV mildly dilated with RV EF 27%, nonspecific RV insertion site  LGE.  No evidence for cardiac amyloidosis.  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  - RHC (10/21): mean RA 9, PA 54/17 mean 33, mean PCWP 20, CI 3.33, PVR 1.9 WU - Cardiomems placed 12/2020.  RHC (1/22) with mean RA  14, PA 60/22, mean PCWP 20, CI 3.06, PVR 3.2.  - EF 55-60%, mild LVH, mild RV enlargement with normal systolic function, IVC dilated, PASP 68 mmHg.  2. CKD stage IV 3. Type 2 diabetes 4. Pleural effusion s/p thoracentesis (due to CHF) 5. HTN 6. 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  7. COVID-19 infection 8. Atrial fibrillation: Chronic.  Not anticoagulated due to chronic anemia and GI bleeding.  - Not Watchman candidate per EP.  9. Anemia: Suspect due to chronic low grade GI bleeding (small bowel AVMs).  10. OSA/OHS: Uses CPAP.  11. Left breast cancer: Treated remotely with chemo/radiation.  12. GERD 13. Chronic mild thrombocytopenia  ROS: All systems negative except as listed in HPI, PMH and Problem List.  Social History   Socioeconomic History   Marital status: Married    Spouse name: Not on file   Number of children: Not on file   Years of education: Not on file   Highest education level: Not on file  Occupational History   Not on file  Tobacco Use   Smoking status: Never   Smokeless tobacco: Never  Vaping Use   Vaping Use: Never used  Substance and Sexual Activity   Alcohol use: Never   Drug use: Never   Sexual activity: Not on file  Other Topics Concern   Not on file  Social History Narrative   Not on file   Social Determinants of Health   Financial Resource Strain: Not on file  Food Insecurity: Not on file  Transportation Needs: No Transportation Needs (07/25/2020)   PRAPARE - Hydrologist (Medical): No    Lack of Transportation (Non-Medical): No  Physical Activity: Not on file  Stress: Not on file  Social Connections: Socially Integrated (07/25/2020)   Social Connection and Isolation Panel [NHANES]    Frequency of Communication with Friends and Family: More than three times a week    Frequency of Social Gatherings with Friends and Family: Once a week    Attends Religious Services: 1 to 4 times per year    Active Member of [employer] or  Organizations: No    Attends Music therapist: 1 to 4 times per year    Marital Status: Married  Human resources officer Violence: Not on file   Family History  Problem Relation Age of Onset   Cancer Mother    Current Outpatient Medications  Medication Sig Dispense Refill   acetaminophen (TYLENOL) 325 MG tablet Take 650 mg by mouth every 6 (six) hours as needed for mild pain or headache.     aspirin EC 81 MG tablet Take 81 mg by mouth daily. Swallow whole.     blood glucose meter kit and supplies Dispense based on patient and insurance preference. Use up to four times daily as directed. (FOR ICD-10 E10.9, E11.9). 1 each 0   Blood Glucose Monitoring Suppl (FIFTY50 GLUCOSE METER 2.0) w/Device KIT See admin instructions.     carvedilol (COREG) 6.25 MG tablet Take 1 tablet (6.25 mg total) by mouth 2 (two) times daily with a meal. 60 tablet 6   glipiZIDE (GLUCOTROL XL) 5 MG 24 hr tablet Take 5 mg by mouth daily with breakfast.      hydrALAZINE (APRESOLINE) 25 MG  tablet Take 1.5 tablets (37.5 mg total) by mouth 3 (three) times daily. 135 tablet 3   isosorbide mononitrate (IMDUR) 60 MG 24 hr tablet Take 1 tablet (60 mg total) by mouth daily. Needs appointment for further refill 90 tablet 0   Menthol-Methyl Salicylate (MUSCLE RUB) 10-15 % CREA Apply 1 application topically as needed for muscle pain (knees).      metolazone (ZAROXOLYN) 2.5 MG tablet Take 1 tablet (2.5 mg total) by mouth once a week. On Saturdays with an extra potassium tablet 10 tablet 3   Multiple Vitamin (MULTIVITAMIN WITH MINERALS) TABS tablet Take 2 tablets by mouth daily. Alive gummy bears     Multiple Vitamins-Minerals (VITAMIN D3 COMPLETE PO) Take 25 mcg by mouth daily.     OXYGEN Inhale 2 L into the lungs at bedtime. As needed Uses CPAP $RemoveB'@HS'DmPFesqG$      pantoprazole (PROTONIX) 40 MG tablet Take 40 mg by mouth as needed.     polyethylene glycol (MIRALAX / GLYCOLAX) 17 g packet Take 17 g by mouth as needed.     potassium chloride  SA (KLOR-CON M) 20 MEQ tablet Take 40 mEq by mouth daily.     torsemide (DEMADEX) 20 MG tablet Take 4 tablets (80 mg total) by mouth every morning AND 2 tablets (40 mg total) every evening. (Patient taking differently: Take 4 tablets (80 mg total) by mouth every morning AND 3 tablets (60 mg total) every evening.) 588 tablet 3   No current facility-administered medications for this encounter.   BP 120/78   Pulse 71   Ht $R'5\' 1"'bB$  (1.549 m)   Wt 93.2 kg (205 lb 6.4 oz)   SpO2 99% Comment: on 2L  BMI 38.81 kg/m   Wt Readings from Last 3 Encounters:  05/28/22 93.2 kg (205 lb 6.4 oz)  04/04/22 91.3 kg (201 lb 3.2 oz)  02/13/22 84.4 kg (186 lb)   PHYSICAL EXAM: General:  NAD. No resp difficulty, arrived in [facility] HEENT: Normal Neck: Supple. JVP to ear. Carotids 2+ bilat; no bruits. No lymphadenopathy or thryomegaly appreciated. Cor: PMI nondisplaced. Irregular rate & rhythm. No rubs, gallops or murmurs. Lungs: Diminished RLL Abdomen: Obese, nontender, nondistended. No hepatosplenomegaly. No bruits or masses. Good bowel sounds. Extremities: No cyanosis, clubbing, rash, 2+ BLE edema to knees Neuro: Alert & oriented x 3, cranial nerves grossly intact. Moves all 4 extremities w/o difficulty. Affect pleasant.  ASSESSMENT & PLAN: 1.  Chronic systolic CHF: Prominent signs of RV failure.  9/21 echo showed EF 40-45%, global hypokinesis, D-shaped IV septum suggestive of RV pressure/volume overload, RV poorly visualized but appeared normal in size with at least mild systolic dysfunction. Cause of cardiomyopathy is uncertain.  She has history of COVID-19 1/21, cannot rule out viral myocarditis. Chemotherapy-mediated CMP is possible (not sure what chemo she had remotely for breast cancer).  Cardiac MRI showed LV EF 41% with diffuse hypokinesis, RV mildly dilated but with EF 27% and D-shaped septum, nonspecific RV insertion site LGE.  Suspect RV > LV failure. Cardiac MRI is not suggestive of amyloidosis, no M-spike  on myeloma panel. We have not ruled out CAD (cath deferred given lack of ischemic CP and CKD). Milrinone started primarily for RV support during admission in 9/21 and titrated off. She had cardiomems placed 1/22. Echo in 1/23 showed improved LV function, EF 55-60%, mild LVH, mild RV enlargement with normal systolic function, IVC dilated, PASP 68 mmHg.  Today, NYHA class III symptoms, functional capacity limited by body habitus as well as  CHF.  She is volume overloaded by exam and Cardiomems, weight is up.  - Increase torsemide to 100 mg bid, she will take an extra metolazone 2.5 mg today with 40 KCL. - Continue metolazone 2.5 mg + 40 KCL on Saturdays. BMET/BNP today, repeat BMET in 1 week. - Continue Coreg 6.25 mg bid.  - Continue hydralazine 37.5 mg tid + Imdur 60 mg daily. - If renal function remains stable, would start SGLT2 inhibitor next appt.  - No ARNI/spironolactone with elevated creatinine.  2. Chronic hypoxemic respiratory failure: OHS/OSA.  She was using home oxygen prior to COVID-19 PNA.  - Continue CPAP at night.  3. Atrial fibrillation: Chronic, all ECGs since 7/21 show atrial fibrillation.  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 Rate is controlled. No overt bleeding.  - She cannot be cardioverted at this point as she cannot be chronically anticoagulated. - Evaluated by Dr. Quentin Ore for [facility] but not a suitable candidate given multiple severe medical comorbidities and risks associated with general anesthesia. 4. GI bleeding: Recurrent, from small bowel AVMs. Unable to be anticoagulated. No recent overt bleeding.  - Stable hgb recently.  5. H/o DVT/PE: Provoked (post-surgery). No longer on anticoagulation due to h/o significant GIB. Has IVC filter.  6. CKD: Stage IV. Follow closely with diuresis. - Followed by Dr. Royce Macadamia. Has appt next month. We discussed dialysis today, she does not want this. - BMET today.  Follow up 2-3 weeks with APP  and 3 months with Dr. Aundra Dubin.  [place], [place] 05/28/2022

## 2022-06-05 ENCOUNTER — Other Ambulatory Visit (HOSPITAL_COMMUNITY): Payer: Self-pay | Admitting: Family Medicine

## 2022-06-06 LAB — BASIC METABOLIC PANEL
BUN/Creatinine Ratio: 25 (ref 12–28)
BUN: 70 mg/dL — ABNORMAL HIGH (ref 8–27)
CO2: 29 mmol/L (ref 20–29)
Calcium: 9.8 mg/dL (ref 8.7–10.3)
Chloride: 95 mmol/L — ABNORMAL LOW (ref 96–106)
Creatinine, Ser: 2.76 mg/dL — ABNORMAL HIGH (ref 0.57–1.00)
Glucose: 122 mg/dL — ABNORMAL HIGH (ref 70–99)
Potassium: 3.9 mmol/L (ref 3.5–5.2)
Sodium: 140 mmol/L (ref 134–144)
eGFR: 18 mL/min/{1.73_m2} — ABNORMAL LOW (ref >59)

## 2022-06-18 NOTE — Progress Notes (Signed)
Advanced Heart Failure Clinic Note PCP: Dr Bea Graff Nephrology: Dr. Royce Macadamia HF Cardiology: Dr Aundra Dubin   HPI: Rita [name] is a 66 y.o. 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    She has had multiple hospitalizations in the last year.  She was admitted January 2021 for COVID-19 pneumonia w/ acute hypoxic respiratory failure.  She did not require intubation.  She was treated with steroids and remdesivir.  She was readmitted twice in 7/21 for CHF exacerbations complicated by pleural effusion requiring IR guided thoracentesis.  She was readmitted again 8/21 for recurrent CHF and diuresed with IV Lasix.  Echo 8/21 showed moderately reduced systolic function, EF 35 to 40% with mild concentric left ventricular hypertrophy and grade 2 diastolic dysfunction, mild hypokinesis of the LV apical segment, RV systolic function was normal.    Readmitted recurrent CHF exacerbation plus AKI on CKD in 9/21.  Previous creatinine last admit was 1.76. Creatinine on this admission elevated at 2.06. Chest x-ray demonstrated cardiomegaly and pulmonary venous congestion with recurrent right-sided pleural effusion.  BNP 673.  CBC showed chronic anemia, hemoglobin 8.9.  Also with worsening thrombocytopenia.  Platelets down to 84K. Echo repeated with EF 40-45%, RV ok, PASP 51 mmHg. Placed on milrinone to support RV for diuresis. Discharge weight 225 pounds. She was discharged on bidil but too expensive. Changed to Imdur + hydral. Cardiac MRI was done this admission, showed LV EF 41%, RV EF 27%,  D-shaped septum, RV insertion site LGE (RV>LV failure), no evidence for cardiac amyloidosis.   She has seen EP for Watchman evaluation given inability to take anticoagulation.  Dr. Quentin Ore thought that she is not a candidate for left atrial appendage occlusion given the risks associated with general anesthesia.  S/p Cardiomems 1/22.  Echo in 1/23 showed EF 55-60%, mild LVH, mild RV enlargement with normal systolic function, IVC dilated, PASP 68 mmHg.   Follow up 4/23, NYHA III, volume overloaded on exam. Torsemide increased to 80/40. Eventually, metolazone added back weekly.  Today she returns for HF follow up with her husband. Overall feeling fatigued and short of breath with minimal activity. Main complaint is constipation. Limited by knee OA. Legs are swelling more. Denies palpitations, CP, dizziness, or PND/Orthopnea. Appetite ok. No fever or chills. Weight at home down 5 lbs. Taking all medications. Eating salty foods and drinks a lot of water. Remains on 2L oxygen. Brother passed away recently. Not wearing CPAP.  ECG (personally reviewed): none ordered today.  Cardiomems PADP 22 yesterday (goal 21)  Labs (11/21): K 4.5, creatinine 2.11 Labs (5/22): K 4.1, creatinine 2.48 Labs (7/22): K 4.8, creatinine 2.56 Labs (8/22): K 4.1, creatinine 1.89 Labs (9/22): K 4.2, creatinine 2.25 Labs (10/22): K 4.4, creatinine 2.48 Labs (11/22): K 4.1, creatinine 2.12 Labs (2/23): LDL 74, TGs 69, hgb 9.8, K 3.6, creatinine 2.85 Labs (5/23): K 3.3, creatinine 2.66 Labs (6/23): K 3.9, creatinine 2.76  PMH: 1. Chronic systolic CHF:  - Echo 3/53 EF 35-40%, mod HK, G2DD, RV normal - Echo 9/21 EF 40-45%, global hypokinesis, D-shaped septum, at least mildly decreased RV systolic function.  - Cardiac MRI (9/21): LV  EF 41%, diffuse hypokinesis, D-shaped septum, RV mildly dilated with RV EF 27%, nonspecific RV insertion site LGE.  No evidence for cardiac amyloidosis.  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  - RHC (10/21): mean RA  9, PA 54/17 mean 33, mean PCWP 20, CI 3.33, PVR 1.9 WU - Cardiomems placed 12/2020.  RHC (1/22) with mean RA 14, PA 60/22, mean PCWP 20, CI 3.06, PVR 3.2.  - EF 55-60%, mild LVH, mild RV enlargement with normal systolic function, IVC dilated, PASP 68 mmHg.  2. CKD stage IV 3. Type 2 diabetes 4. Pleural effusion s/p thoracentesis (due to CHF) 5. HTN 6. 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  7. COVID-19 infection 8. Atrial fibrillation: Chronic.  Not anticoagulated due to chronic anemia and GI bleeding.  - Not Watchman candidate per EP.  9. Anemia: Suspect due to chronic low grade GI bleeding (small bowel AVMs).  10. OSA/OHS: Uses CPAP.  11. Left breast cancer: Treated remotely with chemo/radiation.  12. GERD 13. Chronic mild thrombocytopenia  ROS: All systems negative except as listed in HPI, PMH and Problem List.  Social History   Socioeconomic History   Marital status: Married    Spouse name: Not on file   Number of children: Not on file   Years of education: Not on file   Highest education level: Not on file  Occupational History   Not on file  Tobacco Use   Smoking status: Never   Smokeless tobacco: Never  Vaping Use   Vaping Use: Never used  Substance and Sexual Activity   Alcohol use: Never   Drug use: Never   Sexual activity: Not on file  Other Topics Concern   Not on file  Social History Narrative   Not on file   Social Determinants of Health   Financial Resource Strain: Not on file  Food Insecurity: Not on file  Transportation Needs: No Transportation Needs (07/25/2020)   PRAPARE - Hydrologist (Medical): No    Lack of Transportation (Non-Medical): No  Physical Activity: Not on file  Stress: Not on file  Social Connections: Socially Integrated (07/25/2020)   Social Connection and Isolation Panel [NHANES]    Frequency of Communication with Friends and Family: More than three times a week    Frequency of Social Gatherings with Friends and  Family: Once a week    Attends Religious Services: 1 to 4 times per year    Active Member of [employer] or Organizations: No    Attends Music therapist: 1 to 4 times per year    Marital Status: Married  Human resources officer Violence: Not on file   Family History  Problem Relation Age of Onset   Cancer Mother    Current Outpatient Medications  Medication Sig Dispense Refill   acetaminophen (TYLENOL) 325 MG tablet Take 650 mg by mouth every 6 (six) hours as needed for mild pain or headache.     aspirin EC 81 MG tablet Take 81 mg by mouth daily. Swallow whole.     blood glucose meter kit and supplies Dispense based on patient and insurance preference. Use up to four times daily as directed. (FOR ICD-10 E10.9, E11.9). 1 each 0   Blood Glucose Monitoring Suppl (FIFTY50 GLUCOSE METER 2.0) w/Device KIT See admin instructions.     carvedilol (COREG) 6.25 MG tablet Take 1 tablet (6.25 mg total) by mouth 2 (two) times daily with a meal. 60 tablet 6   glipiZIDE (GLUCOTROL XL) 5 MG 24 hr tablet  Take 5 mg by mouth daily with breakfast.      hydrALAZINE (APRESOLINE) 25 MG tablet Take 1.5 tablets (37.5 mg total) by mouth 3 (three) times daily. 135 tablet 3   isosorbide mononitrate (IMDUR) 60 MG 24 hr tablet Take 1 tablet (60 mg total) by mouth daily. Needs appointment for further refill 90 tablet 0   Menthol-Methyl Salicylate (MUSCLE RUB) 10-15 % CREA Apply 1 application topically as needed for muscle pain (knees).      metolazone (ZAROXOLYN) 2.5 MG tablet Take 1 tablet (2.5 mg total) by mouth once a week. On Saturdays with an extra potassium tablet 10 tablet 3   Multiple Vitamin (MULTIVITAMIN WITH MINERALS) TABS tablet Take 2 tablets by mouth daily. Alive gummy bears     Multiple Vitamins-Minerals (VITAMIN D3 COMPLETE PO) Take 25 mcg by mouth daily.     OXYGEN Inhale 2 L/min into the lungs at bedtime. prn     pantoprazole (PROTONIX) 40 MG tablet Take 40 mg by mouth as needed.     polyethylene  glycol (MIRALAX / GLYCOLAX) 17 g packet Take 17 g by mouth as needed.     potassium chloride SA (KLOR-CON M) 20 MEQ tablet Take 40 mEq by mouth daily.     torsemide (DEMADEX) 20 MG tablet Take 5 tablets (100 mg total) by mouth 2 (two) times daily. 300 tablet 3   No current facility-administered medications for this encounter.   BP 140/80   Pulse 80   Wt 90.6 kg (199 lb 12.8 oz)   SpO2 98% Comment: 2 l n/c  BMI 37.75 kg/m   Wt Readings from Last 3 Encounters:  06/19/22 90.6 kg (199 lb 12.8 oz)  05/28/22 93.2 kg (205 lb 6.4 oz)  04/04/22 91.3 kg (201 lb 3.2 oz)   PHYSICAL EXAM: General:  NAD. No resp difficulty, arrived in [facility] on oxygen, fatigued-appearing. HEENT: Normal Neck: Supple. JVP 6-7. Carotids 2+ bilat; no bruits. No lymphadenopathy or thryomegaly appreciated. Cor: PMI nondisplaced. Irregular rate & rhythm. No rubs, gallops or murmurs. Lungs: Clear Abdomen: Obese, nontender, nondistended. No hepatosplenomegaly. No bruits or masses. Good bowel sounds. Extremities: No cyanosis, clubbing, rash, 2+ BLE edema to knees Neuro: Alert & oriented x 3, cranial nerves grossly intact. Moves all 4 extremities w/o difficulty. Affect pleasant.  ASSESSMENT & PLAN: 1.  Chronic systolic CHF: Prominent signs of RV failure.  9/21 echo showed EF 40-45%, global hypokinesis, D-shaped IV septum suggestive of RV pressure/volume overload, RV poorly visualized but appeared normal in size with at least mild systolic dysfunction. Cause of cardiomyopathy is uncertain.  She has history of COVID-19 1/21, cannot rule out viral myocarditis. Chemotherapy-mediated CMP is possible (not sure what chemo she had remotely for breast cancer).  Cardiac MRI showed LV EF 41% with diffuse hypokinesis, RV mildly dilated but with EF 27% and D-shaped septum, nonspecific RV insertion site LGE.  Suspect RV > LV failure. Cardiac MRI is not suggestive of amyloidosis, no M-spike on myeloma panel. We have not ruled out CAD (cath  deferred given lack of ischemic CP and CKD). Milrinone started primarily for RV support during admission in 9/21 and titrated off. She had cardiomems placed 1/22. Echo in 1/23 showed improved LV function, EF 55-60%, mild LVH, mild RV enlargement with normal systolic function, IVC dilated, PASP 68 mmHg. NYHA class III-IIIb symptoms, functional capacity limited by body habitus as well as CHF. She is appears volume overloaded on exam, however weight is down 6 lbs and Cardiomems 22, goal (21). -  Continue torsemide 100 mg bid, she cane take an extra 40 mg torsemide x 3 days. BMET/BNP today. I asked her to send a Cardiomems reading when she gets home today. - Continue compression hose. Discussed limiting salt and fluid intake. I think some of her LE edema is venous stasis. - Continue metolazone 2.5 mg + 40 KCL on Saturdays.  - Continue Coreg 6.25 mg bid.  - Continue hydralazine 37.5 mg tid + Imdur 60 mg daily. - GFR too low for SGLT2i. - No ARNI/spironolactone with elevated creatinine.  2. Chronic hypoxemic respiratory failure: OHS/OSA.  She was using home oxygen prior to COVID-19 PNA.  - Not wearing CPAP.  3. Atrial fibrillation: Chronic, all ECGs since 7/21 show atrial fibrillation.  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 Rate is controlled. No overt bleeding.  - She cannot be cardioverted at this point as she cannot be chronically anticoagulated. - Evaluated by Dr. Quentin Ore for [place] but not a suitable candidate given multiple severe medical comorbidities and risks associated with general anesthesia. 4. GI bleeding: Recurrent, from small bowel AVMs. Unable to be anticoagulated. No recent overt bleeding.  - Stable hgb recently.  5. H/o DVT/PE: Provoked (post-surgery). No longer on anticoagulation due to h/o significant GIB. Has IVC filter.  6. CKD: Stage IV. Follow closely with diuresis. - BMET today. 7. Constipation: Can try OTC Miralax.  Follow up 2  months with Dr. Aundra Dubin.  [place], [place] 06/19/2022

## 2022-06-19 ENCOUNTER — Encounter (HOSPITAL_COMMUNITY): Payer: Self-pay

## 2022-06-19 ENCOUNTER — Ambulatory Visit (HOSPITAL_COMMUNITY)
Admission: RE | Admit: 2022-06-19 | Discharge: 2022-06-19 | Disposition: A | Discharge status: Home / Self Care | Payer: PPO | Source: Ambulatory Visit | Attending: Family Medicine | Admitting: Family Medicine

## 2022-06-19 VITALS — BP 140/80 | HR 80 | Wt 199.8 lb

## 2022-06-19 DIAGNOSIS — R0602 Shortness of breath: Secondary | ICD-10-CM | POA: Insufficient documentation

## 2022-06-19 DIAGNOSIS — N184 Chronic kidney disease, stage 4 (severe): Secondary | ICD-10-CM | POA: Insufficient documentation

## 2022-06-19 DIAGNOSIS — E1122 Type 2 diabetes mellitus with diabetic chronic kidney disease: Secondary | ICD-10-CM | POA: Insufficient documentation

## 2022-06-19 DIAGNOSIS — K5900 Constipation, unspecified: Secondary | ICD-10-CM | POA: Diagnosis not present

## 2022-06-19 DIAGNOSIS — Q2739 Arteriovenous malformation, other site: Secondary | ICD-10-CM | POA: Diagnosis not present

## 2022-06-19 DIAGNOSIS — I5022 Chronic systolic (congestive) heart failure: Secondary | ICD-10-CM | POA: Insufficient documentation

## 2022-06-19 DIAGNOSIS — E669 Obesity, unspecified: Secondary | ICD-10-CM | POA: Diagnosis not present

## 2022-06-19 DIAGNOSIS — Z7984 Long term (current) use of oral hypoglycemic drugs: Secondary | ICD-10-CM | POA: Insufficient documentation

## 2022-06-19 DIAGNOSIS — Z86711 Personal history of pulmonary embolism: Secondary | ICD-10-CM | POA: Insufficient documentation

## 2022-06-19 DIAGNOSIS — Z923 Personal history of irradiation: Secondary | ICD-10-CM | POA: Insufficient documentation

## 2022-06-19 DIAGNOSIS — J9611 Chronic respiratory failure with hypoxia: Secondary | ICD-10-CM | POA: Insufficient documentation

## 2022-06-19 DIAGNOSIS — Z853 Personal history of malignant neoplasm of breast: Secondary | ICD-10-CM | POA: Insufficient documentation

## 2022-06-19 DIAGNOSIS — I130 Hypertensive heart and chronic kidney disease with heart failure and stage 1 through stage 4 chronic kidney disease, or unspecified chronic kidney disease: Secondary | ICD-10-CM | POA: Diagnosis not present

## 2022-06-19 DIAGNOSIS — Z86718 Personal history of other venous thrombosis and embolism: Secondary | ICD-10-CM | POA: Diagnosis not present

## 2022-06-19 DIAGNOSIS — K219 Gastro-esophageal reflux disease without esophagitis: Secondary | ICD-10-CM | POA: Insufficient documentation

## 2022-06-19 DIAGNOSIS — I482 Chronic atrial fibrillation, unspecified: Secondary | ICD-10-CM | POA: Diagnosis not present

## 2022-06-19 DIAGNOSIS — I429 Cardiomyopathy, unspecified: Secondary | ICD-10-CM | POA: Diagnosis not present

## 2022-06-19 DIAGNOSIS — Z8719 Personal history of other diseases of the digestive system: Secondary | ICD-10-CM | POA: Diagnosis not present

## 2022-06-19 DIAGNOSIS — I4821 Permanent atrial fibrillation: Secondary | ICD-10-CM | POA: Diagnosis not present

## 2022-06-19 DIAGNOSIS — Z79899 Other long term (current) drug therapy: Secondary | ICD-10-CM | POA: Insufficient documentation

## 2022-06-19 DIAGNOSIS — G4733 Obstructive sleep apnea (adult) (pediatric): Secondary | ICD-10-CM | POA: Diagnosis not present

## 2022-06-19 DIAGNOSIS — Z8616 Personal history of COVID-19: Secondary | ICD-10-CM | POA: Insufficient documentation

## 2022-06-19 DIAGNOSIS — K922 Gastrointestinal hemorrhage, unspecified: Secondary | ICD-10-CM | POA: Diagnosis not present

## 2022-06-19 DIAGNOSIS — Z9221 Personal history of antineoplastic chemotherapy: Secondary | ICD-10-CM | POA: Insufficient documentation

## 2022-06-19 LAB — BASIC METABOLIC PANEL
Anion gap: 12 (ref 5–15)
BUN: 100 mg/dL — ABNORMAL HIGH (ref 8–23)
CO2: 31 mmol/L (ref 22–32)
Calcium: 9.2 mg/dL (ref 8.9–10.3)
Chloride: 94 mmol/L — ABNORMAL LOW (ref 98–111)
Creatinine, Ser: 3.62 mg/dL — ABNORMAL HIGH (ref 0.44–1.00)
GFR, Estimated: 13 mL/min — ABNORMAL LOW (ref >60)
Glucose, Bld: 144 mg/dL — ABNORMAL HIGH (ref 70–99)
Potassium: 3.4 mmol/L — ABNORMAL LOW (ref 3.5–5.1)
Sodium: 137 mmol/L (ref 135–145)

## 2022-06-19 LAB — BRAIN NATRIURETIC PEPTIDE: B Natriuretic Peptide: 688.3 pg/mL — ABNORMAL HIGH (ref 0.0–100.0)

## 2022-06-19 NOTE — Patient Instructions (Addendum)
Thank you for coming in today  Labs were done today, if any labs are abnormal the clinic will call you No news is good news  TAKE extra Torsemide 40 mg daily for 3 days   You can use over the counter Miralax twice daily as needed  PLEASE SEND CARDIOMEMS OVER TODAY   At the [facility], you and your health needs are our priority. As part of our continuing mission to provide you with exceptional heart care, we have created designated Provider Care Teams. These Care Teams include your primary Cardiologist (physician) and Advanced Practice Providers (APPs- Physician Assistants and Nurse Practitioners) who all work together to provide you with the care you need, when you need it.   You may see any of the following providers on your designated Care Team at your next follow up: Dr Glori Bickers Dr Haynes Kerns, NP Lyda Jester, [place] [facility] [place], [place] Audry Riles, PharmD   Please be sure to bring in all your medications bottles to every appointment.   If you have any questions or concerns before your next appointment please send [place] a message through [place] or call our office at [phone].    TO LEAVE A MESSAGE FOR THE NURSE SELECT OPTION 2, PLEASE LEAVE A MESSAGE INCLUDING: YOUR NAME DATE OF BIRTH CALL BACK NUMBER REASON FOR CALL**this is important as we prioritize the call backs  YOU WILL RECEIVE A CALL BACK THE SAME DAY AS LONG AS YOU CALL BEFORE 4:00 PM

## 2022-06-26 ENCOUNTER — Inpatient Hospital Stay (HOSPITAL_COMMUNITY)
Admission: EM | Admit: 2022-06-26 | Discharge: 2022-07-04 | DRG: 286 | Disposition: A | Discharge status: Home Health | Payer: PPO | Attending: Internal Medicine | Admitting: Internal Medicine

## 2022-06-26 ENCOUNTER — Emergency Department (HOSPITAL_COMMUNITY): Payer: PPO

## 2022-06-26 ENCOUNTER — Encounter (HOSPITAL_COMMUNITY): Payer: Self-pay | Admitting: Emergency Medicine

## 2022-06-26 ENCOUNTER — Ambulatory Visit (HOSPITAL_COMMUNITY)
Admission: RE | Admit: 2022-06-26 | Discharge: 2022-06-26 | Disposition: A | Discharge status: Home / Self Care | Payer: PPO | Source: Ambulatory Visit | Attending: Internal Medicine | Admitting: Internal Medicine

## 2022-06-26 ENCOUNTER — Telehealth (HOSPITAL_COMMUNITY): Payer: Self-pay | Admitting: *Deleted

## 2022-06-26 ENCOUNTER — Inpatient Hospital Stay (HOSPITAL_COMMUNITY): Payer: PPO

## 2022-06-26 ENCOUNTER — Other Ambulatory Visit (HOSPITAL_COMMUNITY): Payer: Self-pay | Admitting: *Deleted

## 2022-06-26 DIAGNOSIS — D649 Anemia, unspecified: Secondary | ICD-10-CM

## 2022-06-26 DIAGNOSIS — N184 Chronic kidney disease, stage 4 (severe): Secondary | ICD-10-CM | POA: Diagnosis present

## 2022-06-26 DIAGNOSIS — E662 Morbid (severe) obesity with alveolar hypoventilation: Secondary | ICD-10-CM | POA: Diagnosis present

## 2022-06-26 DIAGNOSIS — I130 Hypertensive heart and chronic kidney disease with heart failure and stage 1 through stage 4 chronic kidney disease, or unspecified chronic kidney disease: Secondary | ICD-10-CM | POA: Diagnosis present

## 2022-06-26 DIAGNOSIS — I071 Rheumatic tricuspid insufficiency: Secondary | ICD-10-CM | POA: Diagnosis present

## 2022-06-26 DIAGNOSIS — Z853 Personal history of malignant neoplasm of breast: Secondary | ICD-10-CM

## 2022-06-26 DIAGNOSIS — E440 Moderate protein-calorie malnutrition: Secondary | ICD-10-CM | POA: Diagnosis present

## 2022-06-26 DIAGNOSIS — K7460 Unspecified cirrhosis of liver: Secondary | ICD-10-CM | POA: Diagnosis not present

## 2022-06-26 DIAGNOSIS — E43 Unspecified severe protein-calorie malnutrition: Secondary | ICD-10-CM

## 2022-06-26 DIAGNOSIS — E119 Type 2 diabetes mellitus without complications: Secondary | ICD-10-CM | POA: Diagnosis not present

## 2022-06-26 DIAGNOSIS — N179 Acute kidney failure, unspecified: Secondary | ICD-10-CM | POA: Diagnosis present

## 2022-06-26 DIAGNOSIS — E873 Alkalosis: Secondary | ICD-10-CM | POA: Diagnosis not present

## 2022-06-26 DIAGNOSIS — I4819 Other persistent atrial fibrillation: Secondary | ICD-10-CM | POA: Diagnosis present

## 2022-06-26 DIAGNOSIS — D61818 Other pancytopenia: Secondary | ICD-10-CM | POA: Diagnosis not present

## 2022-06-26 DIAGNOSIS — Z91199 Patient's noncompliance with other medical treatment and regimen due to unspecified reason: Secondary | ICD-10-CM

## 2022-06-26 DIAGNOSIS — J9621 Acute and chronic respiratory failure with hypoxia: Secondary | ICD-10-CM | POA: Diagnosis present

## 2022-06-26 DIAGNOSIS — I5082 Biventricular heart failure: Secondary | ICD-10-CM | POA: Diagnosis present

## 2022-06-26 DIAGNOSIS — K766 Portal hypertension: Secondary | ICD-10-CM | POA: Diagnosis present

## 2022-06-26 DIAGNOSIS — R221 Localized swelling, mass and lump, neck: Secondary | ICD-10-CM | POA: Diagnosis not present

## 2022-06-26 DIAGNOSIS — Z79899 Other long term (current) drug therapy: Secondary | ICD-10-CM

## 2022-06-26 DIAGNOSIS — D631 Anemia in chronic kidney disease: Secondary | ICD-10-CM | POA: Diagnosis present

## 2022-06-26 DIAGNOSIS — I5022 Chronic systolic (congestive) heart failure: Secondary | ICD-10-CM

## 2022-06-26 DIAGNOSIS — J9811 Atelectasis: Secondary | ICD-10-CM | POA: Diagnosis present

## 2022-06-26 DIAGNOSIS — I272 Pulmonary hypertension, unspecified: Secondary | ICD-10-CM

## 2022-06-26 DIAGNOSIS — R601 Generalized edema: Secondary | ICD-10-CM | POA: Diagnosis present

## 2022-06-26 DIAGNOSIS — R188 Other ascites: Secondary | ICD-10-CM | POA: Diagnosis present

## 2022-06-26 DIAGNOSIS — E1122 Type 2 diabetes mellitus with diabetic chronic kidney disease: Secondary | ICD-10-CM | POA: Diagnosis present

## 2022-06-26 DIAGNOSIS — Z9981 Dependence on supplemental oxygen: Secondary | ICD-10-CM

## 2022-06-26 DIAGNOSIS — D696 Thrombocytopenia, unspecified: Secondary | ICD-10-CM | POA: Diagnosis present

## 2022-06-26 DIAGNOSIS — E049 Nontoxic goiter, unspecified: Secondary | ICD-10-CM | POA: Diagnosis present

## 2022-06-26 DIAGNOSIS — J9611 Chronic respiratory failure with hypoxia: Secondary | ICD-10-CM | POA: Diagnosis present

## 2022-06-26 DIAGNOSIS — I152 Hypertension secondary to endocrine disorders: Secondary | ICD-10-CM | POA: Diagnosis present

## 2022-06-26 DIAGNOSIS — N1832 Chronic kidney disease, stage 3b: Secondary | ICD-10-CM | POA: Diagnosis not present

## 2022-06-26 DIAGNOSIS — Z7982 Long term (current) use of aspirin: Secondary | ICD-10-CM

## 2022-06-26 DIAGNOSIS — R778 Other specified abnormalities of plasma proteins: Secondary | ICD-10-CM | POA: Diagnosis not present

## 2022-06-26 DIAGNOSIS — E1159 Type 2 diabetes mellitus with other circulatory complications: Secondary | ICD-10-CM | POA: Diagnosis present

## 2022-06-26 DIAGNOSIS — R262 Difficulty in walking, not elsewhere classified: Secondary | ICD-10-CM | POA: Diagnosis present

## 2022-06-26 DIAGNOSIS — G4733 Obstructive sleep apnea (adult) (pediatric): Secondary | ICD-10-CM | POA: Diagnosis present

## 2022-06-26 DIAGNOSIS — D689 Coagulation defect, unspecified: Secondary | ICD-10-CM | POA: Diagnosis present

## 2022-06-26 DIAGNOSIS — I509 Heart failure, unspecified: Secondary | ICD-10-CM | POA: Diagnosis not present

## 2022-06-26 DIAGNOSIS — Z86718 Personal history of other venous thrombosis and embolism: Secondary | ICD-10-CM

## 2022-06-26 DIAGNOSIS — E669 Obesity, unspecified: Secondary | ICD-10-CM | POA: Diagnosis present

## 2022-06-26 DIAGNOSIS — N183 Chronic kidney disease, stage 3 unspecified: Secondary | ICD-10-CM | POA: Diagnosis present

## 2022-06-26 DIAGNOSIS — I5031 Acute diastolic (congestive) heart failure: Secondary | ICD-10-CM | POA: Diagnosis not present

## 2022-06-26 DIAGNOSIS — Z9221 Personal history of antineoplastic chemotherapy: Secondary | ICD-10-CM

## 2022-06-26 DIAGNOSIS — Z8616 Personal history of COVID-19: Secondary | ICD-10-CM

## 2022-06-26 DIAGNOSIS — I2609 Other pulmonary embolism with acute cor pulmonale: Secondary | ICD-10-CM | POA: Diagnosis present

## 2022-06-26 DIAGNOSIS — M109 Gout, unspecified: Secondary | ICD-10-CM | POA: Diagnosis present

## 2022-06-26 DIAGNOSIS — E66812 Obesity, class 2: Secondary | ICD-10-CM | POA: Diagnosis present

## 2022-06-26 DIAGNOSIS — Z86711 Personal history of pulmonary embolism: Secondary | ICD-10-CM

## 2022-06-26 DIAGNOSIS — I429 Cardiomyopathy, unspecified: Secondary | ICD-10-CM | POA: Diagnosis present

## 2022-06-26 DIAGNOSIS — Z95828 Presence of other vascular implants and grafts: Secondary | ICD-10-CM | POA: Diagnosis not present

## 2022-06-26 DIAGNOSIS — I5033 Acute on chronic diastolic (congestive) heart failure: Secondary | ICD-10-CM | POA: Diagnosis present

## 2022-06-26 DIAGNOSIS — Z7189 Other specified counseling: Secondary | ICD-10-CM | POA: Diagnosis not present

## 2022-06-26 DIAGNOSIS — E1169 Type 2 diabetes mellitus with other specified complication: Secondary | ICD-10-CM | POA: Diagnosis present

## 2022-06-26 DIAGNOSIS — R609 Edema, unspecified: Secondary | ICD-10-CM | POA: Diagnosis present

## 2022-06-26 DIAGNOSIS — K761 Chronic passive congestion of liver: Secondary | ICD-10-CM | POA: Diagnosis present

## 2022-06-26 DIAGNOSIS — Z515 Encounter for palliative care: Secondary | ICD-10-CM | POA: Diagnosis not present

## 2022-06-26 DIAGNOSIS — N189 Chronic kidney disease, unspecified: Secondary | ICD-10-CM | POA: Diagnosis not present

## 2022-06-26 DIAGNOSIS — E876 Hypokalemia: Secondary | ICD-10-CM | POA: Diagnosis not present

## 2022-06-26 DIAGNOSIS — Z923 Personal history of irradiation: Secondary | ICD-10-CM

## 2022-06-26 DIAGNOSIS — Z7984 Long term (current) use of oral hypoglycemic drugs: Secondary | ICD-10-CM

## 2022-06-26 DIAGNOSIS — K5900 Constipation, unspecified: Secondary | ICD-10-CM | POA: Diagnosis present

## 2022-06-26 DIAGNOSIS — Z885 Allergy status to narcotic agent status: Secondary | ICD-10-CM

## 2022-06-26 DIAGNOSIS — I472 Ventricular tachycardia, unspecified: Secondary | ICD-10-CM | POA: Diagnosis not present

## 2022-06-26 DIAGNOSIS — K219 Gastro-esophageal reflux disease without esophagitis: Secondary | ICD-10-CM | POA: Diagnosis present

## 2022-06-26 DIAGNOSIS — R7989 Other specified abnormal findings of blood chemistry: Secondary | ICD-10-CM | POA: Diagnosis not present

## 2022-06-26 DIAGNOSIS — Z6838 Body mass index (BMI) 38.0-38.9, adult: Secondary | ICD-10-CM

## 2022-06-26 LAB — BASIC METABOLIC PANEL
Anion gap: 10 (ref 5–15)
BUN: 109 mg/dL — ABNORMAL HIGH (ref 8–23)
CO2: 34 mmol/L — ABNORMAL HIGH (ref 22–32)
Calcium: 9.5 mg/dL (ref 8.9–10.3)
Chloride: 91 mmol/L — ABNORMAL LOW (ref 98–111)
Creatinine, Ser: 3.68 mg/dL — ABNORMAL HIGH (ref 0.44–1.00)
GFR, Estimated: 13 mL/min — ABNORMAL LOW (ref >60)
Glucose, Bld: 151 mg/dL — ABNORMAL HIGH (ref 70–99)
Potassium: 3.7 mmol/L (ref 3.5–5.1)
Sodium: 135 mmol/L (ref 135–145)

## 2022-06-26 LAB — URINALYSIS, COMPLETE (UACMP) WITH MICROSCOPIC
Bilirubin Urine: NEGATIVE
Glucose, UA: NEGATIVE mg/dL
Hgb urine dipstick: NEGATIVE
Ketones, ur: NEGATIVE mg/dL
Leukocytes,Ua: NEGATIVE
Nitrite: NEGATIVE
Protein, ur: NEGATIVE mg/dL
Specific Gravity, Urine: 1.006 (ref 1.005–1.030)
pH: 5 (ref 5.0–8.0)

## 2022-06-26 LAB — SODIUM, URINE, RANDOM: Sodium, Ur: 91 mmol/L

## 2022-06-26 LAB — TROPONIN I (HIGH SENSITIVITY)
Troponin I (High Sensitivity): 137 ng/L (ref <18)
Troponin I (High Sensitivity): 144 ng/L (ref <18)

## 2022-06-26 LAB — CBC
HCT: 25 % — ABNORMAL LOW (ref 36.0–46.0)
Hemoglobin: 8.6 g/dL — ABNORMAL LOW (ref 12.0–15.0)
MCH: 28.4 pg (ref 26.0–34.0)
MCHC: 34.4 g/dL (ref 30.0–36.0)
MCV: 82.5 fL (ref 80.0–100.0)
Platelets: 119 10*3/uL — ABNORMAL LOW (ref 150–400)
RBC: 3.03 MIL/uL — ABNORMAL LOW (ref 3.87–5.11)
RDW: 13.8 % (ref 11.5–15.5)
WBC: 4.2 10*3/uL (ref 4.0–10.5)
nRBC: 0 % (ref 0.0–0.2)

## 2022-06-26 LAB — BRAIN NATRIURETIC PEPTIDE: B Natriuretic Peptide: 999.4 pg/mL — ABNORMAL HIGH (ref 0.0–100.0)

## 2022-06-26 LAB — CBG MONITORING, ED: Glucose-Capillary: 141 mg/dL — ABNORMAL HIGH (ref 70–99)

## 2022-06-26 LAB — CREATININE, URINE, RANDOM: Creatinine, Urine: 30 mg/dL

## 2022-06-26 MED ORDER — INSULIN ASPART 100 UNIT/ML IJ SOLN
0.0000 [IU] | Freq: Three times a day (TID) | INTRAMUSCULAR | Status: DC
  Administered 2022-06-27: 1 [IU] via SUBCUTANEOUS
  Administered 2022-06-28: 3 [IU] via SUBCUTANEOUS
  Administered 2022-06-28: 2 [IU] via SUBCUTANEOUS
  Administered 2022-06-29: 1 [IU] via SUBCUTANEOUS
  Administered 2022-06-29: 5 [IU] via SUBCUTANEOUS
  Administered 2022-06-29: 3 [IU] via SUBCUTANEOUS
  Administered 2022-06-30 (×2): 2 [IU] via SUBCUTANEOUS
  Administered 2022-07-01 (×2): 1 [IU] via SUBCUTANEOUS
  Administered 2022-07-02 – 2022-07-03 (×2): 2 [IU] via SUBCUTANEOUS
  Administered 2022-07-03: 1 [IU] via SUBCUTANEOUS

## 2022-06-26 MED ORDER — FUROSEMIDE 10 MG/ML IJ SOLN
40.0000 mg | Freq: Every day | INTRAMUSCULAR | Status: DC
  Administered 2022-06-27: 40 mg via INTRAVENOUS
  Filled 2022-06-26 (×2): qty 4

## 2022-06-26 MED ORDER — INSULIN ASPART 100 UNIT/ML IJ SOLN
0.0000 [IU] | Freq: Every day | INTRAMUSCULAR | Status: DC
  Administered 2022-06-28: 2 [IU] via SUBCUTANEOUS
  Administered 2022-06-30: 4 [IU] via SUBCUTANEOUS

## 2022-06-26 NOTE — Assessment & Plan Note (Signed)
Re-start CPAP 

## 2022-06-26 NOTE — ED Provider Triage Note (Signed)
Emergency Medicine Provider Triage Evaluation Note  Rita [name] , a 66 y.o. female  was evaluated in triage.  Pt complains of increasing lower extremity swelling as well as associated shortness of breath.  Patient states that symptoms are gotten increasingly worse over the past week.  She is on 2 L nasal cannula at home.  Just today, she has had increased her oxygen to 4 L.  Upon presentation to the triage area, she had an oxygen saturation of 86 on 4 L.  He was bumped up to 6 L she is satting around 91%.  She is seen by her primary care provider who told her to increase her Lasix at home which has not helped her symptoms.  She denies chest pain, abdominal pain, nausea vomiting/diarrhea, urinary symptoms, change in bowel habits, fever, chills, night sweats.  She has an extensive medical history significant for CKD, diabetes mellitus, CHF, OSA  Review of Systems  Positive: See above Negative:   Physical Exam  BP 136/69 (BP Location: Right Wrist)   Pulse 74   Temp (!) 97.4 F (36.3 C) (Oral)   Resp 16   SpO2 92%  Gen:   Awake, no distress   Resp:  Normal effort  MSK:   Moves extremities without difficulty  Other:  3+ pitting edema noted bilaterally.  Decreased lung sounds on auscultation in bilateral lower lung fields.  Medical Decision Making  Medically screening exam initiated at 2:56 PM.  Appropriate orders placed.  Rita [name] was informed that the remainder of the evaluation will be completed by another provider, this initial triage assessment does not replace that evaluation, and the importance of remaining in the ED until their evaluation is complete.     Wilnette Kales, [place] 06/26/22 1459

## 2022-06-26 NOTE — Assessment & Plan Note (Signed)
this patient has acute respiratory failure with Hypoxia  as documented by the presence of following: O2 saturatio< 90% on RA  Likely due to:  CHF exacerbation Provide O2 therapy and titrate as needed  Continuous pulse ox  check Pulse ox with ambulation prior to discharge

## 2022-06-26 NOTE — ED Provider Notes (Signed)
[facility] EMERGENCY DEPARTMENT Provider Note   CSN: [id number] Arrival date & time: 06/26/22  1313     History  Chief Complaint  Patient presents with   Leg Swelling    Rita [name] is a 66 y.o. female.  HPI Patient presents with worsening leg swelling and edema.  Is had for couple weeks now.  He is on chronic oxygen but had to increase from 1 to 2 L up to 6 L at times.  Has been seen by PCP and had increasing of her fluid pills.  States that the swelling has not improved.  Difficulty walking because of it.  No chest pain.  History of chronic right knee disease.  Slight chest pain at times.  States it feels if it is swollen to her abdomen to an abdomen hurts all over.   Past Medical History:  Diagnosis Date   Acute hypoxemic respiratory failure due to COVID-19 ([place]) 12/30/2019   Anemia    Arthritis    Atrial fibrillation (HCC)    Chronic kidney disease    Chronic respiratory failure with hypoxia, on home O2 therapy ([place])    CKD (chronic kidney disease), stage III ([place])    COVID-19    Diabetes mellitus without complication (HCC)    Diastolic CHF ([place])    GI bleed    GI bleed    Gout    Hypertension    Thrombocytopenia (HCC)     Home Medications Prior to Admission medications   Medication Sig Start Date End Date Taking? Authorizing Provider  acetaminophen (TYLENOL) 325 MG tablet Take 650 mg by mouth every 6 (six) hours as needed for mild pain or headache.    [provider]  aspirin EC 81 MG tablet Take 81 mg by mouth daily. Swallow whole.    [provider]  blood glucose meter kit and supplies Dispense based on patient and insurance preference. Use up to four times daily as directed. (FOR ICD-10 E10.9, E11.9). 01/02/20   Ghimire, Henreitta Leber, MD  Blood Glucose Monitoring Suppl (FIFTY50 GLUCOSE METER 2.0) w/Device KIT See admin instructions. 09/15/20   [provider]  carvedilol (COREG) 6.25 MG tablet Take 1 tablet  (6.25 mg total) by mouth 2 (two) times daily with a meal. 10/19/20 08/01/22  Lyda Jester M, PA-C  glipiZIDE (GLUCOTROL XL) 5 MG 24 hr tablet Take 5 mg by mouth daily with breakfast.  06/08/20   [provider]  hydrALAZINE (APRESOLINE) 25 MG tablet Take 1.5 tablets (37.5 mg total) by mouth 3 (three) times daily. 03/01/22   Larey Dresser, MD  isosorbide mononitrate (IMDUR) 60 MG 24 hr tablet Take 1 tablet (60 mg total) by mouth daily. Needs appointment for further refill 02/02/21   Larey Dresser, MD  Menthol-Methyl Salicylate (MUSCLE RUB) 10-15 % CREA Apply 1 application topically as needed for muscle pain (knees).     [provider]  metolazone (ZAROXOLYN) 2.5 MG tablet Take 1 tablet (2.5 mg total) by mouth once a week. On Saturdays with an extra potassium tablet 01/04/22   Rafael Bihari, FNP  Multiple Vitamin (MULTIVITAMIN WITH MINERALS) TABS tablet Take 2 tablets by mouth daily. Alive gummy bears    [provider]  Multiple Vitamins-Minerals (VITAMIN D3 COMPLETE PO) Take 25 mcg by mouth daily.    [provider]  OXYGEN Inhale 2 L/min into the lungs at bedtime. prn    [provider]  pantoprazole (PROTONIX) 40 MG tablet Take  40 mg by mouth as needed.    [provider]  polyethylene glycol (MIRALAX / GLYCOLAX) 17 g packet Take 17 g by mouth as needed.    [provider]  potassium chloride SA (KLOR-CON M) 20 MEQ tablet Take 40 mEq by mouth daily.    [provider]  torsemide (DEMADEX) 20 MG tablet Take 5 tablets (100 mg total) by mouth 2 (two) times daily. 05/28/22   Rafael Bihari, FNP      Allergies    Codeine, Prednisone, and Moxifloxacin    Review of Systems   Review of Systems  Physical Exam Updated Vital Signs BP (!) 127/59   Pulse (!) 58   Temp (!) 97.4 F (36.3 C) (Oral)   Resp 15   Ht $R'5\' 1"'PO$  (1.549 m)   Wt 85.3 kg   SpO2 96%   BMI 35.52 kg/m  Physical Exam Vitals and nursing note  reviewed.  HENT:     Head: Atraumatic.  Eyes:     Pupils: Pupils are equal, round, and reactive to light.  Cardiovascular:     Rate and Rhythm: Regular rhythm.  Pulmonary:     Breath sounds: Rales present.  Abdominal:     Tenderness: There is abdominal tenderness.     Comments: Diffuse tenderness without rebound or guarding.  No hernias palpated.  Musculoskeletal:     Cervical back: Neck supple.     Right lower leg: Edema present.     Left lower leg: Edema present.     Comments: Moderate pitting edema to bilateral lower extremities.  Does have a few blisters also.  Neurological:     Mental Status: She is alert and oriented to person, place, and time.     ED Results / Procedures / Treatments   Labs (all labs ordered are listed, but only abnormal results are displayed) Labs Reviewed  CBC - Abnormal; Notable for the following components:      Result Value   RBC 3.03 (*)    Hemoglobin 8.6 (*)    HCT 25.0 (*)    Platelets 119 (*)    All other components within normal limits  TROPONIN I (HIGH SENSITIVITY) - Abnormal; Notable for the following components:   Troponin I (High Sensitivity) 137 (*)    All other components within normal limits  TROPONIN I (HIGH SENSITIVITY) - Abnormal; Notable for the following components:   Troponin I (High Sensitivity) 144 (*)    All other components within normal limits    EKG None  Radiology CT ABDOMEN PELVIS WO CONTRAST  Result Date: 06/26/2022 CLINICAL DATA:  Abdominal pain, lower extremity swelling EXAM: CT ABDOMEN AND PELVIS WITHOUT CONTRAST TECHNIQUE: Multidetector CT imaging of the abdomen and pelvis was performed following the standard protocol without IV contrast. Unenhanced CT was performed per clinician order. Lack of IV contrast limits sensitivity and specificity, especially for evaluation of abdominal/pelvic solid viscera. RADIATION DOSE REDUCTION: This exam was performed according to the departmental dose-optimization program which  includes automated exposure control, adjustment of the mA and/or kV according to patient size and/or use of iterative reconstruction technique. COMPARISON:  05/09/2021 FINDINGS: Lower chest: Dense right lower lobe consolidation consistent with atelectasis. The large right pleural effusion increased since prior exam. Continued cardiomegaly without pericardial effusion. Hepatobiliary: Nodular contour of the liver capsule consistent with cirrhosis. No focal abnormality on this limited unenhanced exam. No evidence of cholelithiasis or cholecystitis. Pancreas: Stable 13 mm hypodensity ventral margin pancreatic body. Otherwise unremarkable unenhanced appearance. Spleen:  Unremarkable unenhanced appearance. Adrenals/Urinary Tract: No urinary tract calculi or obstructive uropathy. The adrenals and bladder are unremarkable. Stomach/Bowel: No bowel obstruction or ileus. Normal appendix. No bowel wall thickening or inflammatory change. Vascular/Lymphatic: There is marked distension of the IVC above the level of an IVC filter, as well as distension of the hepatic veins. This is slightly more pronounced since prior study, likely reflects venous reflux some cardiac dysfunction. Stable position of the IVC filter below the right renal vein. Evaluation of the vascular lumen is limited without IV contrast. Stable atherosclerosis of the aorta and its branches. No pathologic adenopathy. Reproductive: Uterus and bilateral adnexa are unremarkable. Other: Subcutaneous edema throughout the lower anterior abdominal wall and dermal thickening, consistent with third spacing of fluid. Trace free fluid within the right upper quadrant. No free intraperitoneal gas. No abdominal wall hernia. Musculoskeletal: No acute or destructive bony lesions. Reconstructed images demonstrate no additional findings. IMPRESSION: 1. Cardiomegaly, with marked distension of the hepatic veins and suprarenal IVC compatible with cardiac dysfunction and venous reflux. 2.  Large right pleural effusion with compressive right lower lobe atelectasis. 3. Subcutaneous edema and dermal wall thickening as above consistent with third spacing of fluid or venous congestion. 4. Nodular contour of the liver compatible with cirrhosis. 5. Trace ascites. 6. Stable indeterminate 13 mm hypodensity within the pancreatic body. If not previously evaluated, definitive characterization with nonemergent outpatient MRI with and without contrast may be useful. 7.  Aortic Atherosclerosis (ICD10-I70.0). Electronically Signed   By: Randa Ngo M.D.   On: 06/26/2022 17:39   DG Chest 2 View  Result Date: 06/26/2022 CLINICAL DATA:  Shortness of breath. EXAM: CHEST - 2 VIEW COMPARISON:  October 28, 2021. FINDINGS: Stable cardiomegaly. Increased right basilar opacity is noted most consistent with subsegmental atelectasis or edema with associated pleural effusion. Bony thorax is unremarkable. IMPRESSION: Increased right basilar opacity is noted most consistent with subsegmental atelectasis or asymmetric edema with associated pleural effusion. Electronically Signed   By: Marijo Conception M.D.   On: 06/26/2022 15:31    Procedures Procedures    Medications Ordered in ED Medications - No data to display  ED Course/ Medical Decision Making/ A&P                           Medical Decision Making Amount and/or Complexity of Data Reviewed Labs: ordered. Radiology: ordered.   Patient with worsening edema.  Has been seen by heart failure clinic.Marland Kitchen  Had increasing weight at home reported but it appears that she is down around 5 kg from prior.  Troponin somewhat elevated as is her BNP.  With abdominal pain will get noncontrast CT scan.  Creatinine is elevated from baseline but where it was a week ago.  Will end up requiring admission to the hospital.  Will discuss with hospitalist.  I have reviewed cardiology note from a week ago  CT scan of abdomen done due to tenderness and does show some volume  overload.  Weight is down but still appears volume overloaded.  May be RV failure.  Discussed with Dr. Domenic Polite from cardiology since the heart failure clinic at [place] and has been time to manage her.  Thinks patient benefit from a medicine admission in the heart failure clinic will follow.  Will discuss with unassigned medicine since [facility].        Final Clinical Impression(s) / ED Diagnoses Final diagnoses:  Anasarca  AKI (acute kidney injury) ([place])  Acute on chronic congestive heart failure, unspecified heart failure type [facility])    Rx / DC Orders ED Discharge Orders     None         Davonna Belling, MD 06/26/22 1952

## 2022-06-26 NOTE — Assessment & Plan Note (Signed)
Noted on CT of abdomen. Patient denies alcohol abuse no prior history of liver disease that she is aware of.   Obtain hepatic panel INR ammonia ANA Hepatitis serologies Appreciate GI evaluation

## 2022-06-26 NOTE — Telephone Encounter (Signed)
Pt here today for labs she is aware to follow up with nephrology.

## 2022-06-26 NOTE — Assessment & Plan Note (Addendum)
CKD stage IIIb Creatinine up from baseline in the setting of CHF.  Monitor renal function while being slightly diuresis. If renal function worsens may need nephrology consult

## 2022-06-26 NOTE — H&P (Signed)
Rita [name] ZOX:[id number] DOB: 1956/08/15 DOA: 06/26/2022     PCP: [name], MD   Outpatient Specialists:  CARDS:   Dr. Deatra Robinson NEphrology: Dr. Baker Pierini  Dr. Melina Copa in [place]    Patient arrived to ER on 06/26/22 at 1313 Referred by Attending Davonna Belling, MD   Patient coming from:    home Lives With family    Chief Complaint:   Chief Complaint  Patient presents with   Leg Swelling    HPI: Rita [name] is a 66 y.o. female with medical history significant of diabetes mellitus diastolic CHF chronic respiratory failure on 1 to 2 L of oxygen anemia, atrial fibrillation, CKD stage III hypertension thrombocytopenia GI bleed    Presented with   swelling Patient presented today leg edema and increased oxygen requirement at baseline she is on 1 to 2 L but had to increase up to 6 L Recently seen by her PCP who increased her diuretics but her swelling did not get any better she has hard time walking because of this no associated chest pain currently but does get occasional chest pains chronically reports swelling is all the way up to her abdomen Has known history of diastolic CHF   Denies any CP   Does not drink does not smoke  Has been off her CPAP for past wk bc company took it away after 90 days  She feels at time a bit confused  Regarding pertinent Chronic problems:       HTN on hydralazine Imdur   chronic CHF diastolic/systolic/ combined - last echo  January 2023 EF 55 to 81% RV systolic function normal moderate-severe pulmonary hypertension and IVC is dilated suggesting volume overload. Metolazone, torsemide  GERD on Protonix      DM 2 -  Lab Results  Component Value Date   HGBA1C 7.5 (H) 08/28/2020   on   Glipizide   obesity-   BMI Readings from Last 1 Encounters:  06/26/22 35.52 kg/m       OSA -on nocturnal oxygen, CPAP has not had it for past 1 week   A. Fib -  - CHA2DS2 vas score   8    Not on  anticoagulation secondary to recurrent bleeding         -  Rate control:  Currently controlled with Coreg       CKD stage IIIb- baseline Cr 2.76 Estimated Creatinine Clearance: 14.5 mL/min (A) (by C-G formula based on SCr of 3.68 mg/dL (H)).  Lab Results  Component Value Date   CREATININE 3.68 (H) 06/26/2022   CREATININE 3.62 (H) 06/19/2022   CREATININE 2.76 (H) 06/05/2022     Chronic anemia - baseline hg Hemoglobin & Hematocrit  Recent Labs    11/16/21 1121 06/26/22 1443  HGB 8.9* 8.6*     While in ER:   Likely CHF exacerbation cardiology aware request medica admit for cardiology to see in AM    CTabd/pelvis - 1. Cardiomegaly, with marked distension of the hepatic veins and suprarenal IVC compatible with cardiac dysfunction and venous reflux. Large right pleural effusion with compressive right lower lobe atelectasis.  Nodular contour of the liver compatible with cirrhosis. Stable indeterminate 13 mm hypodensity within the pancreatic body.    Following Medications were ordered in ER: Medications - No data to display  _______________________________________________________ ER Provider Called:   Cardiology  Dr. Delorise Jackson They Recommend admit to medicine  Will see in AM  ED Triage Vitals  Enc Vitals Group     BP 06/26/22 1436 136/69     Pulse Rate 06/26/22 1436 74     Resp 06/26/22 1436 16     Temp 06/26/22 1436 (!) 97.4 F (36.3 C)     Temp Source 06/26/22 1436 Oral     SpO2 06/26/22 1436 (!) 87 %     Weight 06/26/22 1618 188 lb (85.3 kg)     Height 06/26/22 1618 $RemoveBefor'5\' 1"'CcpEsXofRPGe$  (1.549 m)     Head Circumference --      Peak Flow --      Pain Score 06/26/22 1442 0     Pain Loc --      Pain Edu? --      Excl. in [place]? --   TMAX(24)@     _________________________________________ Significant initial  Findings: Abnormal Labs Reviewed  CBC - Abnormal; Notable for the following components:      Result Value   RBC 3.03 (*)    Hemoglobin 8.6 (*)    HCT 25.0 (*)     Platelets 119 (*)    All other components within normal limits  TROPONIN I (HIGH SENSITIVITY) - Abnormal; Notable for the following components:   Troponin I (High Sensitivity) 137 (*)    All other components within normal limits  TROPONIN I (HIGH SENSITIVITY) - Abnormal; Notable for the following components:   Troponin I (High Sensitivity) 144 (*)    All other components within normal limits     _________________________ Troponin 137 -144 ECG: Ordered Personally reviewed and interpreted by me showing: HR : 77 Rhythm: Atrial fibrillation Right axis deviation Non-specific intra-ventricular conduction delay Nonspecific T wave abnormality Abnormal ECG QTC 495  The recent clinical data is shown below. Vitals:   06/26/22 1915 06/26/22 1930 06/26/22 1945 06/26/22 2000  BP: (!) 128/57 (!) 127/57 (!) 113/55 113/69  Pulse: 70 69 62 67  Resp: 15 14 (!) 21 16  Temp:      TempSrc:      SpO2: 99% 100% 100% 99%  Weight:      Height:        WBC     Component Value Date/Time   WBC 4.2 06/26/2022 1443   LYMPHSABS 0.8 08/29/2020 0410   MONOABS 0.5 08/29/2020 0410   EOSABS 0.2 08/29/2020 0410   BASOSABS 0.0 08/29/2020 0410     UA  ordered   ___________________________ Hospitalist was called for admission for  Anasarca, AKI  Acute on chronic congestive heart failure,  The following Work up has been ordered so far:  Orders Placed This Encounter  Procedures   DG Chest 2 View   CT ABDOMEN PELVIS WO CONTRAST   CBC   Document Height and Actual Weight   Inpatient consult to Cardiology   Consult for [facility] Admission   ED EKG     OTHER Significant initial  Findings:  labs showing:    Recent Labs  Lab 06/26/22 1212  NA 135  K 3.7  CO2 34*  GLUCOSE 151*  BUN 109*  CREATININE 3.68*  CALCIUM 9.5    Cr  Up from baseline see below Lab Results  Component Value Date   CREATININE 3.68 (H) 06/26/2022   CREATININE 3.62 (H) 06/19/2022   CREATININE 2.76 (H)  06/05/2022    No results for input(s): "AST", "ALT", "ALKPHOS", "BILITOT", "PROT", "ALBUMIN" in the last 168 hours. Lab Results  Component Value Date   CALCIUM 9.5 06/26/2022   PHOS 4.4 09/07/2020  Plt: Lab Results  Component Value Date   PLT 119 (L) 06/26/2022    COVID-19 Labs  No results for input(s): "DDIMER", "FERRITIN", "LDH", "CRP" in the last 72 hours.  Lab Results  Component Value Date   SARSCOV2NAA NEGATIVE 12/22/2020   SARSCOV2NAA NEGATIVE 08/28/2020   SARSCOV2NAA NEGATIVE 07/19/2020   [place] NEGATIVE 06/27/2020        Recent Labs  Lab 06/26/22 1443  WBC 4.2  HGB 8.6*  HCT 25.0*  MCV 82.5  PLT 119*    HG/HCT   stable,       Component Value Date/Time   HGB 8.6 (L) 06/26/2022 1443   HGB 9.3 (L) 08/02/2020 1027   HCT 25.0 (L) 06/26/2022 1443   HCT 30.8 (L) 08/02/2020 1027   MCV 82.5 06/26/2022 1443   MCV 87 08/02/2020 1027      .car BNP (last 3 results) Recent Labs    05/28/22 0935 06/19/22 1059 06/26/22 1212  BNP 722.5* 688.3* 999.4*     Cultures:    Component Value Date/Time   SDES FLUID RIGHT PLEURAL 06/28/2020 1501   SDES FLUID RIGHT PLEURAL 06/28/2020 1501   SDES FLUID RIGHT PLEURAL 06/28/2020 1501   SPECREQUEST BOTTLES DRAWN AEROBIC AND ANAEROBIC 10CC 06/28/2020 1501   SPECREQUEST BOTTLES DRAWN AEROBIC AND ANAEROBIC 10CC 06/28/2020 1501   SPECREQUEST NONE 06/28/2020 1501   CULT  06/28/2020 1501    DUE TO VOLUME OF SPECIMEN RECEIVED, FLUID PLACED IN CULTURE BOTTLES PER POLICY. TEST WILL BE CREDITED Performed at [facility], [place] [address] [place], [place] [address]    CULT  06/28/2020 1501    NO GROWTH 5 DAYS Performed at [facility], [place] [address] [place], [place] [address]    REPTSTATUS 06/30/2020 FINAL 06/28/2020 1501   REPTSTATUS 07/03/2020 FINAL 06/28/2020 1501   REPTSTATUS 06/28/2020 FINAL 06/28/2020 1501     Radiological Exams on Admission: CT ABDOMEN PELVIS WO CONTRAST  Result Date:  06/26/2022 CLINICAL DATA:  Abdominal pain, lower extremity swelling EXAM: CT ABDOMEN AND PELVIS WITHOUT CONTRAST TECHNIQUE: Multidetector CT imaging of the abdomen and pelvis was performed following the standard protocol without IV contrast. Unenhanced CT was performed per clinician order. Lack of IV contrast limits sensitivity and specificity, especially for evaluation of abdominal/pelvic solid viscera. RADIATION DOSE REDUCTION: This exam was performed according to the departmental dose-optimization program which includes automated exposure control, adjustment of the mA and/or kV according to patient size and/or use of iterative reconstruction technique. COMPARISON:  05/09/2021 FINDINGS: Lower chest: Dense right lower lobe consolidation consistent with atelectasis. The large right pleural effusion increased since prior exam. Continued cardiomegaly without pericardial effusion. Hepatobiliary: Nodular contour of the liver capsule consistent with cirrhosis. No focal abnormality on this limited unenhanced exam. No evidence of cholelithiasis or cholecystitis. Pancreas: Stable 13 mm hypodensity ventral margin pancreatic body. Otherwise unremarkable unenhanced appearance. Spleen: Unremarkable unenhanced appearance. Adrenals/Urinary Tract: No urinary tract calculi or obstructive uropathy. The adrenals and bladder are unremarkable. Stomach/Bowel: No bowel obstruction or ileus. Normal appendix. No bowel wall thickening or inflammatory change. Vascular/Lymphatic: There is marked distension of the IVC above the level of an IVC filter, as well as distension of the hepatic veins. This is slightly more pronounced since prior study, likely reflects venous reflux some cardiac dysfunction. Stable position of the IVC filter below the right renal vein. Evaluation of the vascular lumen is limited without IV contrast. Stable atherosclerosis of the aorta and its branches. No pathologic adenopathy. Reproductive: Uterus and bilateral  adnexa are unremarkable. Other: Subcutaneous edema throughout the lower anterior abdominal wall and dermal thickening, consistent with third spacing of fluid. Trace free fluid within the right upper quadrant. No free intraperitoneal gas. No abdominal wall hernia. Musculoskeletal: No acute or destructive bony lesions. Reconstructed images demonstrate no additional findings. IMPRESSION: 1. Cardiomegaly, with marked distension of the hepatic veins and suprarenal IVC compatible with cardiac dysfunction and venous reflux. 2. Large right pleural effusion with compressive right lower lobe atelectasis. 3. Subcutaneous edema and dermal wall thickening as above consistent with third spacing of fluid or venous congestion. 4. Nodular contour of the liver compatible with cirrhosis. 5. Trace ascites. 6. Stable indeterminate 13 mm hypodensity within the pancreatic body. If not previously evaluated, definitive characterization with nonemergent outpatient MRI with and without contrast may be useful. 7.  Aortic Atherosclerosis (ICD10-I70.0). Electronically Signed   By: Randa Ngo M.D.   On: 06/26/2022 17:39   DG Chest 2 View  Result Date: 06/26/2022 CLINICAL DATA:  Shortness of breath. EXAM: CHEST - 2 VIEW COMPARISON:  October 28, 2021. FINDINGS: Stable cardiomegaly. Increased right basilar opacity is noted most consistent with subsegmental atelectasis or edema with associated pleural effusion. Bony thorax is unremarkable. IMPRESSION: Increased right basilar opacity is noted most consistent with subsegmental atelectasis or asymmetric edema with associated pleural effusion. Electronically Signed   By: Marijo Conception M.D.   On: 06/26/2022 15:31   _______________________________________________________________________________________________________ Latest  Blood pressure 113/69, pulse 67, temperature (!) 97.4 F (36.3 C), temperature source Oral, resp. rate 16, height $RemoveBe'5\' 1"'zjvQlEZcj$  (1.549 m), weight 85.3 kg, SpO2 99 %.   Vitals   labs and radiology finding personally reviewed  Review of Systems:    Pertinent positives include:  , fatigue, weight loss   Constitutional:  No weight loss, night sweats, Fevers, chills HEENT:  No headaches, Difficulty swallowing,Tooth/dental problems,Sore throat,  No sneezing, itching, ear ache, nasal congestion, post nasal drip,  Cardio-vascular:  No chest pain, Orthopnea, PND, anasarca, dizziness, palpitations.no Bilateral lower extremity swelling  GI:  No heartburn, indigestion, abdominal pain, nausea, vomiting, diarrhea, change in bowel habits, loss of appetite, melena, blood in stool, hematemesis Resp:  no shortness of breath at rest. No dyspnea on exertion, No excess mucus, no productive cough, No non-productive cough, No coughing up of blood.No change in color of mucus.No wheezing. Skin:  no rash or lesions. No jaundice GU:  no dysuria, change in color of urine, no urgency or frequency. No straining to urinate.  No flank pain.  Musculoskeletal:  No joint pain or no joint swelling. No decreased range of motion. No back pain.  Psych:  No change in mood or affect. No depression or anxiety. No memory loss.  Neuro: no localizing neurological complaints, no tingling, no weakness, no double vision, no gait abnormality, no slurred speech, no confusion  All systems reviewed and apart from [place] all are negative _______________________________________________________________________________________________ Past Medical History:   Past Medical History:  Diagnosis Date   Acute hypoxemic respiratory failure due to COVID-19 ([place]) 12/30/2019   Anemia    Arthritis    Atrial fibrillation (HCC)    Chronic kidney disease    Chronic respiratory failure with hypoxia, on home O2 therapy ([place])    CKD (chronic kidney disease), stage III ([place])    COVID-19    Diabetes mellitus without complication (HCC)    Diastolic CHF ([place])    GI bleed    GI bleed    Gout    Hypertension  Thrombocytopenia [facility])      Past Surgical History:  Procedure Laterality Date   HERNIA REPAIR  02/2019   IR THORACENTESIS ASP PLEURAL SPACE W/IMG GUIDE  06/28/2020   IR THORACENTESIS ASP PLEURAL SPACE W/IMG GUIDE  08/31/2020   PRESSURE SENSOR/CARDIOMEMS N/A 12/26/2020   Procedure: PRESSURE SENSOR/CARDIOMEMS;  Surgeon: Larey Dresser, MD;  Location: [place] [facility];  Service: Cardiovascular;  Laterality: N/A;   RIGHT HEART CATH N/A 09/07/2020   Procedure: RIGHT HEART CATH;  Surgeon: Larey Dresser, MD;  Location: [place] [facility];  Service: Cardiovascular;  Laterality: N/A;    Social History:  Ambulatory  cane, walker      reports that she has never smoked. She has never used smokeless tobacco. She reports that she does not drink alcohol and does not use drugs.     Family History:   Family History  Problem Relation Age of Onset   Cancer Mother    ______________________________________________________________________________________________ Allergies: Allergies  Allergen Reactions   Codeine Other (See Comments)    Mouth sores    Prednisone Other (See Comments)    Delusions    Moxifloxacin Rash     Prior to Admission medications   Medication Sig Start Date End Date Taking? Authorizing Provider  acetaminophen (TYLENOL) 325 MG tablet Take 650 mg by mouth every 6 (six) hours as needed for mild pain or headache.    [provider]  aspirin EC 81 MG tablet Take 81 mg by mouth daily. Swallow whole.    [provider]  blood glucose meter kit and supplies Dispense based on patient and insurance preference. Use up to four times daily as directed. (FOR ICD-10 E10.9, E11.9). 01/02/20   Ghimire, Henreitta Leber, MD  Blood Glucose Monitoring Suppl (FIFTY50 GLUCOSE METER 2.0) w/Device KIT See admin instructions. 09/15/20   [provider]  carvedilol (COREG) 6.25 MG tablet Take 1 tablet (6.25 mg total) by mouth 2 (two) times daily with a meal. 10/19/20 08/01/22   Lyda Jester M, PA-C  glipiZIDE (GLUCOTROL XL) 5 MG 24 hr tablet Take 5 mg by mouth daily with breakfast.  06/08/20   [provider]  hydrALAZINE (APRESOLINE) 25 MG tablet Take 1.5 tablets (37.5 mg total) by mouth 3 (three) times daily. 03/01/22   Larey Dresser, MD  isosorbide mononitrate (IMDUR) 60 MG 24 hr tablet Take 1 tablet (60 mg total) by mouth daily. Needs appointment for further refill 02/02/21   Larey Dresser, MD  Menthol-Methyl Salicylate (MUSCLE RUB) 10-15 % CREA Apply 1 application topically as needed for muscle pain (knees).     [provider]  metolazone (ZAROXOLYN) 2.5 MG tablet Take 1 tablet (2.5 mg total) by mouth once a week. On Saturdays with an extra potassium tablet 01/04/22   Rafael Bihari, FNP  Multiple Vitamin (MULTIVITAMIN WITH MINERALS) TABS tablet Take 2 tablets by mouth daily. Alive gummy bears    [provider]  Multiple Vitamins-Minerals (VITAMIN D3 COMPLETE PO) Take 25 mcg by mouth daily.    [provider]  OXYGEN Inhale 2 L/min into the lungs at bedtime. prn    [provider]  pantoprazole (PROTONIX) 40 MG tablet Take 40 mg by mouth as needed.    [provider]  polyethylene glycol (MIRALAX / GLYCOLAX) 17 g packet Take 17 g by mouth as needed.    [provider]  potassium chloride SA (KLOR-CON M) 20 MEQ tablet Take 40 mEq by mouth daily.  [provider]  torsemide (DEMADEX) 20 MG tablet Take 5 tablets (100 mg total) by mouth 2 (two) times daily. 05/28/22   Rafael Bihari, FNP    ___________________________________________________________________________________________________ Physical Exam:    06/26/2022    8:00 PM 06/26/2022    7:45 PM 06/26/2022    7:30 PM  Vitals with BMI  Systolic [phone]  Diastolic 69 55 57  Pulse 67 62 69     1. General:  in No  Acute distress   Chronically ill  -appearing 2. Psychological: Alert and   Oriented 3. Head/ENT:   Moist   Mucous Membranes                          Head Non traumatic, neck supple                         Poor Dentition 4. SKIN:  decreased Skin turgor,  Skin clean Dry and intact no rash 5. Heart: Regular rate and rhythm systolic Murmur, no Rub or gallop 6. Lungs  no wheezes or crackles diminished on the right    7. Abdomen: Soft,  non-tender,  distended   obese  bowel sounds present 8. Lower extremities: no clubbing, cyanosis,  3+ edema 9. Neurologically Grossly intact, moving all 4 extremities equally  no asterexis 10. MSK: Normal range of motion    Chart has been reviewed  ______________________________________________________________________________________________  Assessment/Plan 66 y.o. female with medical history significant of diabetes mellitus diastolic CHF chronic respiratory failure on 1 to 2 L of oxygen anemia, atrial fibrillation, CKD stage III hypertension thrombocytopenia GI bleed   Admitted for   Anasarca  Acute on chronic kidney failure  Acute on chronic congestive heart failure,     Present on Admission:  Acute on chronic diastolic CHF (congestive heart failure) (HCC)  Acute on chronic respiratory failure with hypoxia (HCC)  Anemia  CKD (chronic kidney disease), stage III (HCC)  Hypertension associated with diabetes (HCC)  OSA (obstructive sleep apnea)  Persistent atrial fibrillation (HCC)  Pulmonary hypertension (HCC)  Cirrhosis of liver (HCC)  Elevated troponin  Neck mass  Thrombocytopenia (HCC)  Edema     Acute on chronic diastolic CHF (congestive heart failure) ([place])   - Pt diagnosed with CHF based on presence of the following:   bilateral leg edema, DOE   admit on telemetry,  cycle cardiac enzymes, Troponin 144 Patient is chronically elevated troponin Cardiology aware.    Oobtain serial ECG  to evaluate for ischemia as a cause of heart failure  monitor daily weight:  Filed Weights   06/26/22 1618  Weight: 85.3 kg   Last BNP BNP (last 3  results) Recent Labs    05/28/22 0935 06/19/22 1059 06/26/22 1212  BNP 722.5* 688.3* 999.4*    ProBNP (last 3 results) No results for input(s): "PROBNP" in the last 8760 hours.   diurese with IV lasix 40 mg's IV daily and monitor orthostatics and creatinine to avoid over diuresis.  Order echogram to evaluate EF and valves  ACE/ARBi  Contraindicated    cardiology consulted  CHF team to see in a.m. Pulmonary hypertension could be contributing as well to perhaps mild right heart failure    Acute on chronic respiratory failure with hypoxia (HCC)  this patient has acute respiratory failure with Hypoxia  as documented by the presence of following: O2 saturatio< 90% on RA  Likely due to:  CHF exacerbation  Provide O2 therapy and titrate as needed  Continuous pulse ox  check Pulse ox with ambulation prior to discharge    Anemia Worsening likely secondary to dilution.  Obtain anemia panel Patient denies any GI bleeding  CKD (chronic kidney disease), stage III (HCC) CKD stage IIIb Creatinine up from baseline in the setting of CHF.  Monitor renal function while being slightly diuresis. If renal function worsens may need nephrology consult  Diabetes mellitus without complication ([place]) Order sliding scale hold p.o. medications check hemoglobin A1c and TSH  Hypertension associated with diabetes ([place]) Hold home medications for tonight   Imdur 60 mg a day hydralazine37.5 3 times daily Given soft blood pressures in the ED to allow room for diuresis  Persistent atrial fibrillation (HCC) Continue Coreg 6.25 mg twice daily Not on anticoagulation given thrombocytopenia and easy bruising spells chronic anemia  Pulmonary hypertension ([place]) Repeat echogram appreciate cardiology consult continue oxygen  Cirrhosis of liver ([place]) Noted on CT of abdomen. Patient denies alcohol abuse no prior history of liver disease that she is aware of.   Obtain hepatic panel INR ammonia ANA Hepatitis  serologies Appreciate GI evaluation   Elevated troponin In the setting of CHF exacerbation Repeat echo appreciate cardiology consult EKG nonischemic no chest pain  OSA (obstructive sleep apnea) Restart CPAP  Neck mass Chronic patient states she has it all her life  Thrombocytopenia ([place]) Chronic could be explained by possible underlying cirrhosis  Edema Bilateral but left worse than right could be secondary to third spacing versus right heart failure. Given asymmetry obtain Dopplers    Other plan as per orders.  DVT prophylaxis:  SCD     Code Status:    Code Status: Prior FULL CODE  as per patient   I had personally discussed CODE STATUS with patient    Family Communication:   Family not at  Bedside    Disposition Plan:       To home once workup is complete and patient is stable   Following barriers for discharge:                            Electrolytes corrected                               Anemia stable                                                      Will need consultants to evaluate patient prior to discharge                       Would benefit from PT/OT eval prior to DC  Ordered                                                  Nutrition    consulted                                   Consults called: cardiology is aware Sent msg to  [facility]  Admission status:  ED Disposition     ED Disposition  Admit   Condition  --   [place]: [place] [[id number]]  Level of Care: Telemetry Cardiac [[id number]]  May admit patient to [facility] or [facility] if equivalent level of care is available:: No  Covid Evaluation: Asymptomatic - no recent exposure (last 10 days) testing not required  Diagnosis: Acute on chronic diastolic CHF (congestive heart failure) [facility]) [[id number]]  Admitting Physician: Toy Baker [[id number]]  Attending Physician: Toy Baker [[id number]]  Certification:: I certify this patient will need inpatient services  for at least 2 midnights  Estimated Length of Stay: 2           inpatient     I Expect 2 midnight stay secondary to severity of patient's current illness need for inpatient interventions justified by the following:  hemodynamic instability despite optimal treatment (  hypoxia )  Severe lab/radiological/exam abnormalities including:    Anasarca and extensive comorbidities including:  DM2   CHF   Obesity  CKD   That are currently affecting medical management.   I expect  patient to be hospitalized for 2 midnights requiring inpatient medical care.  Patient is at high risk for adverse outcome (such as loss of life or disability) if not treated.  Indication for inpatient stay as follows:    New or worsening hypoxia   Need for IV diuretics    Level of care     tele  For 24H      Jamall Strohmeier 06/26/2022, 9:29 PM    [facility]     after 2 AM please page floor coverage PA If 7AM-7PM, please contact the day team taking care of the patient using [email or web address]   Patient was evaluated in the context of the global COVID-19 pandemic, which necessitated consideration that the patient might be at risk for infection with the SARS-CoV-2 virus that causes COVID-19. Institutional protocols and algorithms that pertain to the evaluation of patients at risk for COVID-19 are in a state of rapid change based on information released by regulatory bodies including the CDC and federal and state organizations. These policies and algorithms were followed during the patient's care.

## 2022-06-26 NOTE — Assessment & Plan Note (Addendum)
Hold home medications for tonight   Imdur 60 mg a day hydralazine37.5 3 times daily Given soft blood pressures in the ED to allow room for diuresis

## 2022-06-26 NOTE — ED Notes (Signed)
Patient transported to CT 

## 2022-06-26 NOTE — Telephone Encounter (Signed)
Pt left vm stating her swelling in stomach and ankles is not any better from last week and she does not feel well. Pt said she was told to call and give an update.   Routed to [employer]

## 2022-06-26 NOTE — Assessment & Plan Note (Signed)
-   Pt diagnosed with CHF based on presence of the following:   bilateral leg edema, DOE   admit on telemetry,  cycle cardiac enzymes, Troponin 144 Patient is chronically elevated troponin Cardiology aware.    Oobtain serial ECG  to evaluate for ischemia as a cause of heart failure  monitor daily weight:  Filed Weights   06/26/22 1618  Weight: 85.3 kg   Last BNP BNP (last 3 results) Recent Labs    05/28/22 0935 06/19/22 1059 06/26/22 1212  BNP 722.5* 688.3* 999.4*    ProBNP (last 3 results) No results for input(s): "PROBNP" in the last 8760 hours.   diurese with IV lasix 40 mg's IV daily and monitor orthostatics and creatinine to avoid over diuresis.  Order echogram to evaluate EF and valves  ACE/ARBi  Contraindicated    cardiology consulted  CHF team to see in a.m. Pulmonary hypertension could be contributing as well to perhaps mild right heart failure

## 2022-06-26 NOTE — Assessment & Plan Note (Signed)
Repeat echogram appreciate cardiology consult continue oxygen

## 2022-06-26 NOTE — Assessment & Plan Note (Signed)
Bilateral but left worse than right could be secondary to third spacing versus right heart failure. Given asymmetry obtain Dopplers

## 2022-06-26 NOTE — ED Notes (Signed)
Critical Troponin 137; MD Pickering aware.  No new orders at this time.

## 2022-06-26 NOTE — Subjective & Objective (Signed)
Patient presented today leg edema and increased oxygen requirement at baseline she is on 1 to 2 L but had to increase up to 6 L Recently seen by her PCP who increased her diuretics but her swelling did not get any better she has hard time walking because of this no associated chest pain currently but does get occasional chest pains chronically reports swelling is all the way up to her abdomen Has known history of diastolic CHF

## 2022-06-26 NOTE — ED Triage Notes (Addendum)
Patient with history of CHF here with bilateral lower extremity edema that started one week ago after attending her brother's funeral. Patient denies missing any doses of her diuretic. States after the funeral her legs were unusually swollen and never improved. Patient wears 2L O2 [place] at baseline, patient speaking in complete sentences and in no apparent distress.

## 2022-06-26 NOTE — Assessment & Plan Note (Signed)
Order sliding scale hold p.o. medications check hemoglobin A1c and TSH

## 2022-06-26 NOTE — Assessment & Plan Note (Signed)
In the setting of CHF exacerbation Repeat echo appreciate cardiology consult EKG nonischemic no chest pain

## 2022-06-26 NOTE — Assessment & Plan Note (Signed)
Chronic patient states she has it all her life

## 2022-06-26 NOTE — Assessment & Plan Note (Signed)
Chronic could be explained by possible underlying cirrhosis

## 2022-06-26 NOTE — ED Notes (Signed)
Linens and patient changed

## 2022-06-26 NOTE — Assessment & Plan Note (Addendum)
Worsening likely secondary to dilution.  Obtain anemia panel Patient denies any GI bleeding

## 2022-06-26 NOTE — Assessment & Plan Note (Addendum)
Continue Coreg 6.25 mg twice daily Not on anticoagulation given thrombocytopenia and easy bruising spells chronic anemia

## 2022-06-27 ENCOUNTER — Inpatient Hospital Stay (HOSPITAL_COMMUNITY): Payer: PPO

## 2022-06-27 ENCOUNTER — Other Ambulatory Visit: Payer: Self-pay

## 2022-06-27 ENCOUNTER — Encounter (HOSPITAL_COMMUNITY): Payer: Self-pay | Admitting: Internal Medicine

## 2022-06-27 DIAGNOSIS — K761 Chronic passive congestion of liver: Secondary | ICD-10-CM

## 2022-06-27 DIAGNOSIS — I5033 Acute on chronic diastolic (congestive) heart failure: Secondary | ICD-10-CM | POA: Diagnosis not present

## 2022-06-27 DIAGNOSIS — K7460 Unspecified cirrhosis of liver: Secondary | ICD-10-CM | POA: Diagnosis not present

## 2022-06-27 DIAGNOSIS — R7989 Other specified abnormal findings of blood chemistry: Secondary | ICD-10-CM

## 2022-06-27 DIAGNOSIS — I5031 Acute diastolic (congestive) heart failure: Secondary | ICD-10-CM | POA: Diagnosis not present

## 2022-06-27 DIAGNOSIS — R609 Edema, unspecified: Secondary | ICD-10-CM

## 2022-06-27 DIAGNOSIS — R188 Other ascites: Secondary | ICD-10-CM | POA: Diagnosis not present

## 2022-06-27 HISTORY — PX: IR THORACENTESIS ASP PLEURAL SPACE W/IMG GUIDE: IMG5380

## 2022-06-27 LAB — GLUCOSE, CAPILLARY
Glucose-Capillary: 80 mg/dL (ref 70–99)
Glucose-Capillary: 90 mg/dL (ref 70–99)
Glucose-Capillary: 125 mg/dL — ABNORMAL HIGH (ref 70–99)
Glucose-Capillary: 169 mg/dL — ABNORMAL HIGH (ref 70–99)

## 2022-06-27 LAB — CBC WITH DIFFERENTIAL/PLATELET
Abs Immature Granulocytes: 0.02 10*3/uL (ref 0.00–0.07)
Basophils Absolute: 0 10*3/uL (ref 0.0–0.1)
Basophils Relative: 1 %
Eosinophils Absolute: 0.1 10*3/uL (ref 0.0–0.5)
Eosinophils Relative: 3 %
HCT: 23.1 % — ABNORMAL LOW (ref 36.0–46.0)
Hemoglobin: 8 g/dL — ABNORMAL LOW (ref 12.0–15.0)
Immature Granulocytes: 1 %
Lymphocytes Relative: 16 %
Lymphs Abs: 0.6 10*3/uL — ABNORMAL LOW (ref 0.7–4.0)
MCH: 28.7 pg (ref 26.0–34.0)
MCHC: 34.6 g/dL (ref 30.0–36.0)
MCV: 82.8 fL (ref 80.0–100.0)
Monocytes Absolute: 0.5 10*3/uL (ref 0.1–1.0)
Monocytes Relative: 13 %
Neutro Abs: 2.5 10*3/uL (ref 1.7–7.7)
Neutrophils Relative %: 66 %
Platelets: 115 10*3/uL — ABNORMAL LOW (ref 150–400)
RBC: 2.79 MIL/uL — ABNORMAL LOW (ref 3.87–5.11)
RDW: 14 % (ref 11.5–15.5)
WBC: 3.7 10*3/uL — ABNORMAL LOW (ref 4.0–10.5)
nRBC: 0 % (ref 0.0–0.2)

## 2022-06-27 LAB — CBC
HCT: 24 % — ABNORMAL LOW (ref 36.0–46.0)
Hemoglobin: 8.3 g/dL — ABNORMAL LOW (ref 12.0–15.0)
MCH: 28.6 pg (ref 26.0–34.0)
MCHC: 34.6 g/dL (ref 30.0–36.0)
MCV: 82.8 fL (ref 80.0–100.0)
Platelets: 120 10*3/uL — ABNORMAL LOW (ref 150–400)
RBC: 2.9 MIL/uL — ABNORMAL LOW (ref 3.87–5.11)
RDW: 14.1 % (ref 11.5–15.5)
WBC: 3.8 10*3/uL — ABNORMAL LOW (ref 4.0–10.5)
nRBC: 0 % (ref 0.0–0.2)

## 2022-06-27 LAB — OSMOLALITY: Osmolality: 331 mOsm/kg (ref 275–295)

## 2022-06-27 LAB — BLOOD GAS, VENOUS
Acid-Base Excess: 14.6 mmol/L — ABNORMAL HIGH (ref 0.0–2.0)
Bicarbonate: 40.9 mmol/L — ABNORMAL HIGH (ref 20.0–28.0)
Drawn by: 401411
O2 Saturation: 97.3 %
Patient temperature: 37
pCO2, Ven: 63 mmHg — ABNORMAL HIGH (ref 44–60)
pH, Ven: 7.42 (ref 7.25–7.43)
pO2, Ven: 76 mmHg — ABNORMAL HIGH (ref 32–45)

## 2022-06-27 LAB — GRAM STAIN

## 2022-06-27 LAB — HEPATITIS PANEL, ACUTE
HCV Ab: NONREACTIVE
Hep A IgM: NONREACTIVE
Hep B C IgM: NONREACTIVE
Hepatitis B Surface Ag: NONREACTIVE

## 2022-06-27 LAB — BODY FLUID CELL COUNT WITH DIFFERENTIAL
Eos, Fluid: 0 %
Lymphs, Fluid: 15 %
Monocyte-Macrophage-Serous Fluid: 60 % (ref 50–90)
Neutrophil Count, Fluid: 25 % (ref 0–25)
Total Nucleated Cell Count, Fluid: 38 cu mm (ref 0–1000)

## 2022-06-27 LAB — HEPATIC FUNCTION PANEL
ALT: 16 U/L (ref 0–44)
AST: 21 U/L (ref 15–41)
Albumin: 3.2 g/dL — ABNORMAL LOW (ref 3.5–5.0)
Alkaline Phosphatase: 81 U/L (ref 38–126)
Bilirubin, Direct: 0.8 mg/dL — ABNORMAL HIGH (ref 0.0–0.2)
Indirect Bilirubin: 1.1 mg/dL — ABNORMAL HIGH (ref 0.3–0.9)
Total Bilirubin: 1.9 mg/dL — ABNORMAL HIGH (ref 0.3–1.2)
Total Protein: 7.3 g/dL (ref 6.5–8.1)

## 2022-06-27 LAB — SEDIMENTATION RATE: Sed Rate: 34 mm/hr — ABNORMAL HIGH (ref 0–22)

## 2022-06-27 LAB — BASIC METABOLIC PANEL
Anion gap: 13 (ref 5–15)
BUN: 115 mg/dL — ABNORMAL HIGH (ref 8–23)
CO2: 36 mmol/L — ABNORMAL HIGH (ref 22–32)
Calcium: 9.1 mg/dL (ref 8.9–10.3)
Chloride: 91 mmol/L — ABNORMAL LOW (ref 98–111)
Creatinine, Ser: 3.56 mg/dL — ABNORMAL HIGH (ref 0.44–1.00)
GFR, Estimated: 13 mL/min — ABNORMAL LOW (ref >60)
Glucose, Bld: 76 mg/dL (ref 70–99)
Potassium: 3.2 mmol/L — ABNORMAL LOW (ref 3.5–5.1)
Sodium: 140 mmol/L (ref 135–145)

## 2022-06-27 LAB — IRON AND TIBC
Iron: 50 ug/dL (ref 28–170)
Saturation Ratios: 22 % (ref 10.4–31.8)
TIBC: 225 ug/dL — ABNORMAL LOW (ref 250–450)
UIBC: 175 ug/dL

## 2022-06-27 LAB — RETICULOCYTES
Immature Retic Fract: 10.1 % (ref 2.3–15.9)
RBC.: 2.81 MIL/uL — ABNORMAL LOW (ref 3.87–5.11)
Retic Count, Absolute: 25 10*3/uL (ref 19.0–186.0)
Retic Ct Pct: 0.9 % (ref 0.4–3.1)

## 2022-06-27 LAB — PROTEIN, PLEURAL OR PERITONEAL FLUID: Total protein, fluid: 4.4 g/dL

## 2022-06-27 LAB — PREALBUMIN: Prealbumin: 12 mg/dL — ABNORMAL LOW (ref 18–38)

## 2022-06-27 LAB — MAGNESIUM
Magnesium: 2.8 mg/dL — ABNORMAL HIGH (ref 1.7–2.4)
Magnesium: 3 mg/dL — ABNORMAL HIGH (ref 1.7–2.4)

## 2022-06-27 LAB — PHOSPHORUS
Phosphorus: 5.1 mg/dL — ABNORMAL HIGH (ref 2.5–4.6)
Phosphorus: 4.8 mg/dL — ABNORMAL HIGH (ref 2.5–4.6)

## 2022-06-27 LAB — FERRITIN: Ferritin: 245 ng/mL (ref 11–307)

## 2022-06-27 LAB — VITAMIN B12: Vitamin B-12: 829 pg/mL (ref 180–914)

## 2022-06-27 LAB — LACTATE DEHYDROGENASE, PLEURAL OR PERITONEAL FLUID: LD, Fluid: 107 U/L — ABNORMAL HIGH (ref 3–23)

## 2022-06-27 LAB — FOLATE: Folate: 39.3 ng/mL (ref >5.9)

## 2022-06-27 LAB — LACTIC ACID, PLASMA: Lactic Acid, Venous: 0.7 mmol/L (ref 0.5–1.9)

## 2022-06-27 LAB — PROTIME-INR
INR: 1.4 — ABNORMAL HIGH (ref 0.8–1.2)
Prothrombin Time: 17.1 seconds — ABNORMAL HIGH (ref 11.4–15.2)

## 2022-06-27 LAB — C-REACTIVE PROTEIN: CRP: 3.4 mg/dL — ABNORMAL HIGH (ref <1.0)

## 2022-06-27 LAB — TSH: TSH: 0.592 u[IU]/mL (ref 0.350–4.500)

## 2022-06-27 LAB — HEMOGLOBIN A1C
Hgb A1c MFr Bld: 7 % — ABNORMAL HIGH (ref 4.8–5.6)
Mean Plasma Glucose: 154.2 mg/dL

## 2022-06-27 LAB — CK: Total CK: 74 U/L (ref 38–234)

## 2022-06-27 LAB — HIV ANTIBODY (ROUTINE TESTING W REFLEX): HIV Screen 4th Generation wRfx: NONREACTIVE

## 2022-06-27 LAB — TROPONIN I (HIGH SENSITIVITY)
Troponin I (High Sensitivity): 155 ng/L (ref <18)
Troponin I (High Sensitivity): 161 ng/L (ref <18)

## 2022-06-27 LAB — AMMONIA: Ammonia: 44 umol/L — ABNORMAL HIGH (ref 9–35)

## 2022-06-27 MED ORDER — CARVEDILOL 6.25 MG PO TABS
6.2500 mg | ORAL_TABLET | Freq: Two times a day (BID) | ORAL | Status: DC
Start: 2022-06-27 — End: 2022-06-27

## 2022-06-27 MED ORDER — ENOXAPARIN SODIUM 30 MG/0.3ML IJ SOSY
30.0000 mg | PREFILLED_SYRINGE | INTRAMUSCULAR | Status: DC
  Administered 2022-06-27 – 2022-07-04 (×8): 30 mg via SUBCUTANEOUS
  Filled 2022-06-27 (×8): qty 0.3

## 2022-06-27 MED ORDER — PANTOPRAZOLE SODIUM 40 MG PO TBEC
40.0000 mg | DELAYED_RELEASE_TABLET | Freq: Every day | ORAL | Status: DC
Start: 2022-06-27 — End: 2022-06-27

## 2022-06-27 MED ORDER — METOLAZONE 5 MG PO TABS
5.0000 mg | ORAL_TABLET | Freq: Two times a day (BID) | ORAL | Status: AC
  Administered 2022-06-27 – 2022-06-28 (×2): 5 mg via ORAL
  Filled 2022-06-27 (×2): qty 1

## 2022-06-27 MED ORDER — POTASSIUM CHLORIDE CRYS ER 20 MEQ PO TBCR
40.0000 meq | EXTENDED_RELEASE_TABLET | Freq: Four times a day (QID) | ORAL | Status: AC
  Administered 2022-06-27 (×2): 40 meq via ORAL
  Filled 2022-06-27 (×2): qty 2

## 2022-06-27 MED ORDER — ACETAMINOPHEN 325 MG PO TABS: 650.0000 mg | ORAL_TABLET | Freq: Four times a day (QID) | ORAL | Status: DC | PRN

## 2022-06-27 MED ORDER — SODIUM CHLORIDE 0.9 % IV SOLN: 250.0000 mL | INTRAVENOUS | Status: DC | PRN

## 2022-06-27 MED ORDER — ASPIRIN 81 MG PO CHEW
81.0000 mg | CHEWABLE_TABLET | Freq: Every day | ORAL | Status: DC
  Administered 2022-06-27: 81 mg via ORAL
  Filled 2022-06-27: qty 1

## 2022-06-27 MED ORDER — ASPIRIN 81 MG PO TBEC
81.0000 mg | DELAYED_RELEASE_TABLET | Freq: Every day | ORAL | Status: DC
  Administered 2022-06-28 – 2022-07-04 (×6): 81 mg via ORAL
  Filled 2022-06-27 (×6): qty 1

## 2022-06-27 MED ORDER — LIDOCAINE HCL 1 % IJ SOLN
INTRAMUSCULAR | Status: AC
  Filled 2022-06-27: qty 20

## 2022-06-27 MED ORDER — FUROSEMIDE 10 MG/ML IJ SOLN: 80.0000 mg | Freq: Two times a day (BID) | INTRAMUSCULAR | Status: DC

## 2022-06-27 MED ORDER — PANTOPRAZOLE SODIUM 40 MG PO TBEC
40.0000 mg | DELAYED_RELEASE_TABLET | Freq: Every day | ORAL | Status: DC
  Administered 2022-06-27 – 2022-07-04 (×8): 40 mg via ORAL
  Filled 2022-06-27 (×8): qty 1

## 2022-06-27 MED ORDER — SODIUM CHLORIDE 0.9% FLUSH: 3.0000 mL | INTRAVENOUS | Status: DC | PRN

## 2022-06-27 MED ORDER — SODIUM CHLORIDE 0.9% FLUSH
3.0000 mL | Freq: Two times a day (BID) | INTRAVENOUS | Status: DC
  Administered 2022-06-29 – 2022-07-04 (×7): 3 mL via INTRAVENOUS

## 2022-06-27 MED ORDER — LIDOCAINE HCL (PF) 1 % IJ SOLN
INTRAMUSCULAR | Status: DC | PRN
  Administered 2022-06-27: 10 mL
  Administered 2022-07-02: 3 mL

## 2022-06-27 MED ORDER — ACETAMINOPHEN 650 MG RE SUPP: 650.0000 mg | Freq: Four times a day (QID) | RECTAL | Status: DC | PRN

## 2022-06-27 MED ORDER — ALBUTEROL SULFATE (2.5 MG/3ML) 0.083% IN NEBU: 2.5000 mg | INHALATION_SOLUTION | RESPIRATORY_TRACT | Status: DC | PRN

## 2022-06-27 MED ORDER — HYDROCODONE-ACETAMINOPHEN 5-325 MG PO TABS: 1.0000 | ORAL_TABLET | ORAL | Status: DC | PRN

## 2022-06-27 MED ORDER — POLYETHYLENE GLYCOL 3350 17 G PO PACK
17.0000 g | PACK | ORAL | Status: DC | PRN
  Administered 2022-06-27 – 2022-06-30 (×3): 17 g via ORAL
  Filled 2022-06-27 (×3): qty 1

## 2022-06-27 MED ORDER — FUROSEMIDE 10 MG/ML IJ SOLN
20.0000 mg/h | INTRAVENOUS | Status: DC
  Administered 2022-06-27 – 2022-06-30 (×7): 20 mg/h via INTRAVENOUS
  Filled 2022-06-27 (×9): qty 20

## 2022-06-27 MED ORDER — BOOST PLUS PO LIQD
237.0000 mL | Freq: Three times a day (TID) | ORAL | Status: DC
  Administered 2022-06-28 – 2022-06-30 (×7): 237 mL via ORAL
  Filled 2022-06-27 (×21): qty 237

## 2022-06-27 NOTE — Progress Notes (Signed)
Cpap was taken off, pt does not want it, she said "its tight and makes my head Hurt." Readjusted but pt still insist to remove it. NCO2 was place back. 4 L.

## 2022-06-27 NOTE — Consult Note (Addendum)
Consultation  Referring Provider: [facility]/ Broadus John Primary Care Physician:  [name], MD Primary Gastroenterologist: unassigned here-Dr. Toney Rakes  Reason for Consultation: Congestive heart failure, new imaging finding of cirrhosis  HPI: Rita [name] is a 66 y.o. female, who has been followed by Dr. Melina [name] in [place] from a GI perspective, but not seen over the past couple of years.  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 Patient has history of severe congestive heart failure, on chronic O2 at 2 L at home,, known right heart failure, atrial fibrillation, chronic kidney disease stage III, hypertension, adult onset diabetes mellitus, prior history of DVT/PE prior IVC filter and remote history of breast cancer about 30 years ago. She has had multiple admissions over the past year prior for congestive heart failure. Echo January 2023 with EF of 55 to 60%, the IVC is dilated and she has mild to moderate right ventricular enlargement. She has been followed by heart failure [facility]/Dr. Aundra Dubin.  The last had visit there on 06/19/2022-that time complaints with some increased shortness of breath and fatigue, no significant changes to regimen. To the emergency room last night with increased shortness of breath and lower extremity edema over the past week and a half.  She had been instructed to increase dose of diuretic but had not had any improvement in edema.  CT of the abdomen pelvis was done last evening showing cardiomegaly, dilation of the hepatic veins and suprarenal IVC consistent with cardiac dysfunction, also has a large right pleural effusion, and nodular liver consistent with cirrhosis, no ascites. Labs-WBC 3.8/hemoglobin 8.3/hematocrit 24/platelets 120 BUN 109/creatinine 3 ANA .68 Pro time 17/INR 1.4 T. bili 1.9 LFTs otherwise within normal limits Troponins elevated at  137/144 Ferritin 245 hepatitis serologies and ANA pending  Reviewing imaging she had an ultrasound in 2021 not show evidence of cirrhosis, or splenomegaly but had elation of the intrahepatic inferior vena cava and central hepatic veins suggestive of right heart failure.  Patient has not been aware of any prior diagnosis of liver disease or cirrhosis.  No family history of liver disease, no history of hepatitis, no EtOH.  She says her last procedures with Dr. Melina [name] were couple of years ago.  Only current GI complaint is constipation which she has been told is secondary to meds, has been using twice daily MiraLAX at home.   Past Medical History:  Diagnosis Date   Acute hypoxemic respiratory failure due to COVID-19 ([place]) 12/30/2019   Anemia    Arthritis    Atrial fibrillation (HCC)    Chronic kidney disease    Chronic respiratory failure with hypoxia, on home O2 therapy ([place])    CKD (chronic kidney disease), stage III ([place])    COVID-19    Diabetes mellitus without complication (HCC)    Diastolic CHF ([place])    GI bleed    GI bleed    Gout    Hypertension    Thrombocytopenia ([place])     Past Surgical History:  Procedure Laterality Date   HERNIA REPAIR  02/2019   IR THORACENTESIS ASP PLEURAL SPACE W/IMG GUIDE  06/28/2020   IR THORACENTESIS ASP PLEURAL SPACE W/IMG GUIDE  08/31/2020   PRESSURE SENSOR/CARDIOMEMS N/A 12/26/2020   Procedure: PRESSURE SENSOR/CARDIOMEMS;  Surgeon: Larey Dresser, MD;  Location: [facility];  Service: Cardiovascular;  Laterality: N/A;   RIGHT HEART CATH N/A 09/07/2020   Procedure: RIGHT HEART  CATH;  Surgeon: Larey Dresser, MD;  Location: [facility];  Service: Cardiovascular;  Laterality: N/A;    Prior to Admission medications   Medication Sig Start Date End Date Taking? Authorizing Provider  acetaminophen (TYLENOL) 325 MG tablet Take 650 mg by mouth every 6 (six) hours as needed for mild pain or headache.   Yes [provider]   allopurinol (ZYLOPRIM) 100 MG tablet Take 200 mg by mouth daily. 04/20/22  Yes [provider]  aspirin EC 81 MG tablet Take 81 mg by mouth daily. Swallow whole.   Yes [provider]  carvedilol (COREG) 6.25 MG tablet Take 1 tablet (6.25 mg total) by mouth 2 (two) times daily with a meal. 10/19/20 08/01/22 Yes Simmons, Brittainy M, PA-C  glipiZIDE (GLUCOTROL XL) 5 MG 24 hr tablet Take 5 mg by mouth daily with breakfast.  06/08/20  Yes [provider]  hydrALAZINE (APRESOLINE) 25 MG tablet Take 1.5 tablets (37.5 mg total) by mouth 3 (three) times daily. 03/01/22  Yes Larey Dresser, MD  isosorbide mononitrate (IMDUR) 30 MG 24 hr tablet Take 30 mg by mouth daily. 04/20/22  Yes [provider]  Menthol-Methyl Salicylate (MUSCLE RUB) 10-15 % CREA Apply 1 application topically as needed for muscle pain (knees).    Yes [provider]  metolazone (ZAROXOLYN) 2.5 MG tablet Take 1 tablet (2.5 mg total) by mouth once a week. On Saturdays with an extra potassium tablet 01/04/22  Yes Milford, Maricela Bo, FNP  Multiple Vitamin (MULTIVITAMIN WITH MINERALS) TABS tablet Take 2 tablets by mouth daily. Alive gummy bears   Yes [provider]  Multiple Vitamins-Minerals (VITAMIN D3 COMPLETE PO) Take 25 mcg by mouth daily.   Yes [provider]  polyethylene glycol (MIRALAX / GLYCOLAX) 17 g packet Take 17 g by mouth daily.   Yes [provider]  potassium chloride SA (KLOR-CON M) 20 MEQ tablet Take 40 mEq by mouth 2 (two) times daily.   Yes [provider]  torsemide (DEMADEX) 20 MG tablet Take 5 tablets (100 mg total) by mouth 2 (two) times daily. Patient taking differently: Take 60-100 mg by mouth 2 (two) times daily. Take 5 tablets (100 mg) in the morning and Take 3 tablets (60 mg) in afternoon 05/28/22  Yes Milford, Maricela Bo, FNP  blood glucose meter kit and supplies Dispense based on patient and insurance preference. Use up to four times  daily as directed. (FOR ICD-10 E10.9, E11.9). 01/02/20   Ghimire, Henreitta Leber, MD  Blood Glucose Monitoring Suppl (FIFTY50 GLUCOSE METER 2.0) w/Device KIT See admin instructions. 09/15/20   [provider]  isosorbide mononitrate (IMDUR) 60 MG 24 hr tablet Take 1 tablet (60 mg total) by mouth daily. Needs appointment for further refill Patient not taking: Reported on 06/26/2022 02/02/21   Larey Dresser, MD  OXYGEN Inhale 2 L/min into the lungs at bedtime. prn    [provider]    Current Facility-Administered Medications  Medication Dose Route Frequency Provider Last Rate Last Admin   aspirin chewable tablet 81 mg  81 mg Oral Daily Domenic Polite, MD       enoxaparin (LOVENOX) injection 30 mg  30 mg Subcutaneous Q24H Domenic Polite, MD       furosemide (LASIX) injection 80 mg  80 mg Intravenous BID Domenic Polite, MD       insulin aspart (novoLOG) injection 0-5 Units  0-5 Units Subcutaneous QHS Toy Baker, MD  insulin aspart (novoLOG) injection 0-9 Units  0-9 Units Subcutaneous TID WC Doutova, Anastassia, MD       pantoprazole (PROTONIX) EC tablet 40 mg  40 mg Oral Q1200 Domenic Polite, MD        Allergies as of 06/26/2022 - Review Complete 06/26/2022  Allergen Reaction Noted   Codeine Other (See Comments) 12/30/2019   Prednisone Other (See Comments) 12/30/2019   Moxifloxacin Rash 12/30/2019    Family History  Problem Relation Age of Onset   Cancer Mother     Social History   Socioeconomic History   Marital status: Married    Spouse name: Not on file   Number of children: Not on file   Years of education: Not on file   Highest education level: Not on file  Occupational History   Not on file  Tobacco Use   Smoking status: Never   Smokeless tobacco: Never  Vaping Use   Vaping Use: Never used  Substance and Sexual Activity   Alcohol use: Never   Drug use: Never   Sexual activity: Not on file  Other Topics Concern   Not on file  Social  History Narrative   Not on file   Social Determinants of Health   Financial Resource Strain: Not on file  Food Insecurity: Not on file  Transportation Needs: No Transportation Needs (07/25/2020)   PRAPARE - Hydrologist (Medical): No    Lack of Transportation (Non-Medical): No  Physical Activity: Not on file  Stress: Not on file  Social Connections: Socially Integrated (07/25/2020)   Social Connection and Isolation Panel [NHANES]    Frequency of Communication with Friends and Family: More than three times a week    Frequency of Social Gatherings with Friends and Family: Once a week    Attends Religious Services: 1 to 4 times per year    Active Member of [employer] or Organizations: No    Attends Music therapist: 1 to 4 times per year    Marital Status: Married  Human resources officer Violence: Not on file    Review of Systems: Pertinent positive and negative review of systems were noted in the above HPI section.  All other review of systems was otherwise negative.   Physical Exam: Vital signs in last 24 hours: Temp:  [97.4 F (36.3 C)-98.4 F (36.9 C)] 98.2 F (36.8 C) (07/20 0720) Pulse Rate:  [53-90] 73 (07/20 0720) Resp:  [11-23] 20 (07/20 0720) BP: (102-139)/(47-91) 102/47 (07/20 0720) SpO2:  [85 %-100 %] 96 % (07/20 0720) Weight:  [85.3 kg-93 kg] 93 kg (07/20 0200)   General:   Alert,  Well-developed, obese, chronically ill-appearing older African-American female ,pleasant and cooperative in NAD Head:  Normocephalic and atraumatic. Eyes:  Sclera clear, no icterus.   Conjunctiva pink. Ears:  Normal auditory acuity. Nose:  No deformity, discharge,  or lesions. Mouth:  No deformity or lesions.   Neck:  Supple; no masses or thyromegaly. Lungs: Significantly decreased breath sounds right lung  Heart:  irRegular rate and rhythm; diast murmur Abdomen:  Soft, obese, no appreciable fluid wave, nontender, BS active,nonpalp mass or hsm.  Mild  edema bilateral flanks Rectal: Done today Msk:  Symmetrical without gross deformities. . Pulses:  Normal pulses noted. Extremities: 2+ edema bilateral lower extremities to above the knees Neurologic:  Alert and  oriented x4;  grossly normal neurologically. Skin:  Intact without significant lesions or rashes.. Psych:  Alert and cooperative. Normal mood and affect.  Intake/Output from previous day: 07/19 0701 - 07/20 0700 In: -  Out: 1100 [Urine:1100] Intake/Output this shift: No intake/output data recorded.  Lab Results: Recent Labs    06/26/22 1443 06/27/22 0254 06/27/22 0805  WBC 4.2 3.7* 3.8*  HGB 8.6* 8.0* 8.3*  HCT 25.0* 23.1* 24.0*  PLT 119* 115* 120*   BMET Recent Labs    06/26/22 1212  NA 135  K 3.7  CL 91*  CO2 34*  GLUCOSE 151*  BUN 109*  CREATININE 3.68*  CALCIUM 9.5   LFT Recent Labs    06/27/22 0254  PROT 7.3  ALBUMIN 3.2*  AST 21  ALT 16  ALKPHOS 81  BILITOT 1.9*  BILIDIR 0.8*  IBILI 1.1*   PT/INR Recent Labs    06/27/22 0254  LABPROT 17.1*  INR 1.4*   Hepatitis Panel No results for input(s): "HEPBSAG", "HCVAB", "HEPAIGM", "HEPBIGM" in the last 72 hours.    IMPRESSION:  #45  66 year old African-American female with known right-sided heart failure, chronic respiratory failure, requiring chronic O2 -admitted last night with progressive shortness of breath and lower extremity edema. Work-up has revealed large right pleural effusion, and evidence of worsening/progressive right heart failure.  #2 chronic kidney disease stage III with AKI #3 adult onset diabetes mellitus 4.  History of atrial fibrillation-not anticoagulated secondary to prior history of GI bleeding #5 history of DVT/PE status post IVC filter  #6 new finding incidental on CT imaging of cirrhotic appearing liver, no liver mass noted, no ascites.  Imaging shows marked distention of hepatic veins and the suprarenal IVC consistent with cardiac dysfunction as well as the large  right pleural effusion  No prior history of known liver disease Very likely has cirrhosis on the basis of right-sided heart failure  Hepatitis serologies are pending, ANA pending, ferritin 245  Plan;  Further cardiac evaluation and diuresis as per heart failure team Would not do further serologic evaluation other than those studies pending i.e. hepatitis serologies and ANA  At some point in time could consider EGD for varices screening, however not appropriate at present.  She is already on Coreg will also address portal hypertension.      Amy Esterwood PA-C 06/27/2022, 8:53 AM   I have taken an interval history, thoroughly reviewed the chart and examined the patient. I agree with the Advanced Practitioner's note, impression and recommendations, and have recorded additional findings, impressions and recommendations below. I performed a substantive portion of this encounter (>50% time spent), including a complete performance of the medical decision making.  My additional thoughts are as follows:  I agree entirely that this overall clinical picture indicates that the cirrhotic appearing liver on imaging is from chronic congestive hepatopathy.  She has pancytopenia, mild coagulopathy and trace ascites, but primarily evidence of severe right heart failure and volume overload with pleural effusion and peripheral edema.  Negative prior viral hepatitis serologies, iron study not consistent with hemochromatosis.  I would forego any additional serologic work-up for autoimmune or metabolic liver diseases, as we have a clear explanation for this.  I would also forego an upper endoscopy to screen for varices since she is already on Coreg.  No additional testing or changes in treatment from our perspective.  Inpatient GI service will sign off, and she can follow-up as needed with Dr. Melina [name] in [place] after discharge.   Nelida Meuse III Office:[phone]

## 2022-06-27 NOTE — Progress Notes (Signed)
Initial Nutrition Assessment  DOCUMENTATION CODES:   Non-severe (moderate) malnutrition in context of chronic illness  INTERVENTION:   Boost Plus TID  Encourage PO intake  Reviewed menu options and meal ordering   NUTRITION DIAGNOSIS:   Moderate Malnutrition related to chronic illness as evidenced by moderate fat depletion, moderate muscle depletion.  GOAL:   Patient will meet greater than or equal to 90% of their needs  MONITOR:   PO intake, Supplement acceptance, Weight trends  REASON FOR ASSESSMENT:        ASSESSMENT:     Pt with PMH of CHF, afib, HTN, DM, DVT/PE s/p IVC filter, chronic hypoxic respiratory failure on home O2, mod to severe pulmonary HTN, obesity, breast ca s/p surgery/chemo/XRT, CKD stage IV admitted with worsening edema, abd distention and SOB due to acute on chronic CHF.   Spoke with pt who reports that she usually uses her CPAP machine at night but it was recently taken back by company and she has not had it.  Pt reports that some days she does not eat much due to stomach pain that she reports is from constipation which she is now taking miralax for. She reports that she follows a low sodium diet and limits soda and bread. She states that 2 years ago she had fluid removed from her belly and started this diet.  Breakfast is late around 10:30 and is usually grits and eggs. No lunch, Dinner is around 6 and is baked chicken, potatoes, and vegetables. She reports she cooks some and when she does she cooks enough for a few meals. Her husband will also pick up food for meals when needed.   Renal following for cardio-renal syndrome. Per heart failure team pt is not a good candidate for HD.    7/20 s/p thoracentesis with 1.2 L removed   Medications reviewed and include: SSI, protonix IV lasix  Labs reviewed: K 3.2, BUN: 115, Cr 3.56, Ammonia 44, Prealbumin 12, BNP: 999.4 CRP 3.4 A1C: 7     NUTRITION - FOCUSED PHYSICAL EXAM:  Flowsheet Row Most Recent  Value  Orbital Region Mild depletion  Upper Arm Region No depletion  Thoracic and Lumbar Region No depletion  Buccal Region Mild depletion  Temple Region Moderate depletion  Clavicle Bone Region No depletion  Clavicle and Acromion Bone Region Moderate depletion  Scapular Bone Region Moderate depletion  Dorsal Hand Moderate depletion  Patellar Region Unable to assess  Anterior Thigh Region No depletion  Posterior Calf Region No depletion  Edema (RD Assessment) Moderate  Hair Reviewed  Eyes Reviewed  Mouth Reviewed  [missing teeth]  Skin Reviewed  Nails Reviewed       Diet Order:   Diet Order             Diet Carb Modified Fluid consistency: Thin; Room service appropriate? Yes  Diet effective now                   EDUCATION NEEDS:   Education needs have been addressed  Skin:  Skin Assessment: Reviewed RN Assessment  Last BM:  per pt 7/19 small  Height:   Ht Readings from Last 1 Encounters:  06/27/22 5\' 1"  (1.549 m)    Weight:   Wt Readings from Last 1 Encounters:  06/27/22 93 kg    BMI:  Body mass index is 38.74 kg/m.  Estimated Nutritional Needs:   Kcal:  1800-2000  Protein:  90-110 grams  Fluid:  >1.8 L/day  Lockie Pares., RD, LDN,  CNSC See AMiON for contact information

## 2022-06-27 NOTE — Progress Notes (Signed)
Heart Failure Navigator Progress Note  Assessed for Heart & Vascular TOC clinic readiness.  Patient does not meet criteria due to consulted to Advanced Heart Failure Team. .    Earnestine Leys, BSN, RN Heart Failure Nurse Navigator Secure Chat Only

## 2022-06-27 NOTE — Consult Note (Addendum)
Advanced Heart Failure Team Consult Note   Primary Physician: [name], MD HF Cardiologist:  Loralie Champagne, MD  Reason for Consultation: acute on chronic diastolic heart failure  HPI:    Rita [name] is seen today for evaluation of acute on chronic diastolic heart failure at the request of Dr. Broadus John.   Rita [name] is a 66 year old AA female with PMH COVID 19 infection, chronic diastolic heart failure, persistent atrial fibrillation no longer on anticoagulation due to history of GI bleeding and chronic anemia, stage III CKD, hypertension, type 2 diabetes, history of provoked DVT/PE following orthopedic surgery s/p IVC filter, chronic hypoxic respiratory failure 2L/min home O2 baseline, obesity, GERD and prior h/o left sided breast cancer 30 years ago, treated w/ surgery, chemo + radiation.   EP evaluated for [facility], not a candidate for left atrial appendage occlusion given the risks associated with general anesthesia.  Readmitted recurrent CHF exacerbation complicated by AKI on CKD in 9/21. No hospital admits over the last year.   Followed closely in the AHF clinic for the last several years. Rita. [name] was recently seen in AHF clinic 06/19/22. Appeared fluid overloaded on exam, increased diuretics and added metolazone weekly. Per pt she didn't have any significant UOP.   Presented to ED for BLE edema that has worsened significantly over the last week along with abdominal edema and intermittent SOB. BNP 999, HsTrop flat 137>>144>>155>>161, Cr 3.56 (previous 2.7), EKG: A fib 77 bpm, nonspecific intra-ventricular conduction delay, nonspecific T wave abnormality. CXR: Increased right basilar opacity is noted most consistent with subsegmental atelectasis or asymmetric edema with associated pleural effusion, CT abdomen pelvis noted cardiomegaly, marked distention of hepatic veins and suprarenal IVC, large right pleural effusion, abdominal wall edema, liver cirrhosis, trace  ascites and hypodensity in pancreatic body   She got 40mg  of lasix in the ED with 1L UOP. Today lasix was increased 80mg  BID with decreased UOP, Poor response to IV lasix. Nephrology consulted. IR consulted for pleural effusion  Today, breathing feels better. Still edematous to thighs. Denies CP.  Cardiac testing reviewed:  Echo 8/21 EF 35-40%, mod HK, G2DD, RV normal Echo 9/21 EF 40-45%, global hypokinesis, D-shaped septum, at least mildly decreased RV systolic function.  Cardiac MRI (9/21): LV EF 41%, diffuse hypokinesis, D-shaped septum, RV mildly dilated with RV EF 27%, nonspecific RV insertion site LGE.  No evidence for cardiac amyloidosis.  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  RHC (10/21): mean RA 9, PA 54/17 mean 33, mean PCWP 20, CI 3.33, PVR 1.9 [facility] Cardiomems placed 12/2020.  RHC (1/22) with mean RA 14, PA 60/22, mean PCWP 20, CI 3.06, PVR 3.2.  EF 55-60%, mild LVH, mild RV enlargement with normal systolic function, IVC dilated, PASP 68 mmHg.    Review of Systems: [y] = yes, [ ]  = no   General: Weight gain [Y ]; Weight loss [ ] ; Anorexia [ ] ; Fatigue [Y ]; Fever [ ] ; Chills [ ] ; Weakness [ ]   Cardiac: Chest pain/pressure [ ] ; Resting SOB [ ] ; Exertional SOB [ Y]; Orthopnea [ Y]; Pedal Edema [Y ]; Palpitations [ ] ; Syncope [ ] ; Presyncope [ ] ; Paroxysmal nocturnal dyspnea[ ]   Pulmonary: Cough [ ] ; Wheezing[ ] ; Hemoptysis[ ] ; Sputum [ ] ; Snoring [ ]   GI: Vomiting[ ] ; Dysphagia[ ] ; Melena[ ] ; Hematochezia [ ] ; Heartburn[ ] ; Abdominal pain [ ] ; Constipation [ ] ; Diarrhea [ ] ; BRBPR [ ]   GU: Hematuria[ ] ; Dysuria [ ] ; Nocturia[ ]   Vascular: Pain in  legs with walking [ ] ; Pain in feet with lying flat [ ] ; Non-healing sores [ ] ; Stroke [ ] ; TIA [ ] ; Slurred speech [ ] ;  Neuro: Headaches[ ] ; Vertigo[ ] ; Seizures[ ] ; Paresthesias[ ] ;Blurred vision [ ] ; Diplopia [ ] ; Vision changes [ ]   Ortho/Skin: Arthritis [ ] ; Joint pain [ ] ; Muscle pain [ ] ; Joint swelling [ ] ; Back Pain [ ] ; Rash [ ]   Psych: Depression[ ] ;  Anxiety[ ]   Heme: Bleeding problems [ ] ; Clotting disorders [ ] ; Anemia [ Y]  Endocrine: Diabetes [ Y]; Thyroid dysfunction[ ]   Home Medications Prior to Admission medications   Medication Sig Start Date End Date Taking? Authorizing Provider  acetaminophen (TYLENOL) 325 MG tablet Take 650 mg by mouth every 6 (six) hours as needed for mild pain or headache.   Yes [provider]  allopurinol (ZYLOPRIM) 100 MG tablet Take 200 mg by mouth daily. 04/20/22  Yes [provider]  aspirin EC 81 MG tablet Take 81 mg by mouth daily. Swallow whole.   Yes [provider]  carvedilol (COREG) 6.25 MG tablet Take 1 tablet (6.25 mg total) by mouth 2 (two) times daily with a meal. 10/19/20 08/01/22 Yes Simmons, Brittainy M, PA-C  glipiZIDE (GLUCOTROL XL) 5 MG 24 hr tablet Take 5 mg by mouth daily with breakfast.  06/08/20  Yes [provider]  hydrALAZINE (APRESOLINE) 25 MG tablet Take 1.5 tablets (37.5 mg total) by mouth 3 (three) times daily. 03/01/22  Yes Larey Dresser, MD  isosorbide mononitrate (IMDUR) 30 MG 24 hr tablet Take 30 mg by mouth daily. 04/20/22  Yes [provider]  Menthol-Methyl Salicylate (MUSCLE RUB) 10-15 % CREA Apply 1 application topically as needed for muscle pain (knees).    Yes [provider]  metolazone (ZAROXOLYN) 2.5 MG tablet Take 1 tablet (2.5 mg total) by mouth once a week. On Saturdays with an extra potassium tablet 01/04/22  Yes Milford, Maricela Bo, FNP  Multiple Vitamin (MULTIVITAMIN WITH MINERALS) TABS tablet Take 2 tablets by mouth daily. Alive gummy bears   Yes [provider]  Multiple Vitamins-Minerals (VITAMIN D3 COMPLETE PO) Take 25 mcg by mouth daily.   Yes [provider]  polyethylene glycol (MIRALAX / GLYCOLAX) 17 g packet Take 17 g by mouth daily.   Yes [provider]  potassium chloride SA (KLOR-CON M) 20 MEQ tablet Take 40 mEq by mouth 2 (two) times daily.   Yes [provider]   torsemide (DEMADEX) 20 MG tablet Take 5 tablets (100 mg total) by mouth 2 (two) times daily. Patient taking differently: Take 60-100 mg by mouth 2 (two) times daily. Take 5 tablets (100 mg) in the morning and Take 3 tablets (60 mg) in afternoon 05/28/22  Yes Milford, Maricela Bo, FNP  blood glucose meter kit and supplies Dispense based on patient and insurance preference. Use up to four times daily as directed. (FOR ICD-10 E10.9, E11.9). 01/02/20   Ghimire, Henreitta Leber, MD  Blood Glucose Monitoring Suppl (FIFTY50 GLUCOSE METER 2.0) w/Device KIT See admin instructions. 09/15/20   [provider]  isosorbide mononitrate (IMDUR) 60 MG 24 hr tablet Take 1 tablet (60 mg total) by mouth daily. Needs appointment for further refill Patient not taking: Reported on 06/26/2022 02/02/21   Larey Dresser, MD  OXYGEN Inhale 2 L/min into the lungs at bedtime. prn    [provider]    Past Medical History: Past Medical History:  Diagnosis Date   Acute hypoxemic  respiratory failure due to COVID-19 [facility]) 12/30/2019   Anemia    Arthritis    Atrial fibrillation (HCC)    Chronic kidney disease    Chronic respiratory failure with hypoxia, on home O2 therapy ([place])    CKD (chronic kidney disease), stage III ([place])    COVID-19    Diabetes mellitus without complication (HCC)    Diastolic CHF ([place])    GI bleed    GI bleed    Gout    Hypertension    Thrombocytopenia ([place])     Past Surgical History: Past Surgical History:  Procedure Laterality Date   HERNIA REPAIR  02/2019   IR THORACENTESIS ASP PLEURAL SPACE W/IMG GUIDE  06/28/2020   IR THORACENTESIS ASP PLEURAL SPACE W/IMG GUIDE  08/31/2020   PRESSURE SENSOR/CARDIOMEMS N/A 12/26/2020   Procedure: PRESSURE SENSOR/CARDIOMEMS;  Surgeon: Larey Dresser, MD;  Location: [place] [facility] LAB;  Service: Cardiovascular;  Laterality: N/A;   RIGHT HEART CATH N/A 09/07/2020   Procedure: RIGHT HEART CATH;  Surgeon: Larey Dresser, MD;  Location: [place]  CV LAB;  Service: Cardiovascular;  Laterality: N/A;    Family History: Family History  Problem Relation Age of Onset   Cancer Mother     Social History: Social History   Socioeconomic History   Marital status: Married    Spouse name: Not on file   Number of children: Not on file   Years of education: Not on file   Highest education level: Not on file  Occupational History   Not on file  Tobacco Use   Smoking status: Never   Smokeless tobacco: Never  Vaping Use   Vaping Use: Never used  Substance and Sexual Activity   Alcohol use: Never   Drug use: Never   Sexual activity: Not on file  Other Topics Concern   Not on file  Social History Narrative   Not on file   Social Determinants of Health   Financial Resource Strain: Not on file  Food Insecurity: Not on file  Transportation Needs: No Transportation Needs (07/25/2020)   PRAPARE - Hydrologist (Medical): No    Lack of Transportation (Non-Medical): No  Physical Activity: Not on file  Stress: Not on file  Social Connections: Socially Integrated (07/25/2020)   Social Connection and Isolation Panel [NHANES]    Frequency of Communication with Friends and Family: More than three times a week    Frequency of Social Gatherings with Friends and Family: Once a week    Attends Religious Services: 1 to 4 times per year    Active Member of [employer] or Organizations: No    Attends Music therapist: 1 to 4 times per year    Marital Status: Married    Allergies:  Allergies  Allergen Reactions   Codeine Other (See Comments)    Mouth sores    Prednisone Other (See Comments)    Delusions    Moxifloxacin Rash    Objective:    Vital Signs:   Temp:  [97.4 F (36.3 C)-98.4 F (36.9 C)] 97.6 F (36.4 C) (07/20 1100) Pulse Rate:  [53-90] 73 (07/20 1100) Resp:  [11-23] 18 (07/20 1100) BP: (102-139)/(47-91) 120/52 (07/20 1100) SpO2:  [85 %-100 %] 95 % (07/20 1100) Weight:  [85.3 kg-93  kg] 93 kg (07/20 0200)    Weight change: Filed Weights   06/26/22 1618 06/27/22 0200  Weight: 85.3 kg 93 kg    Intake/Output:   Intake/Output Summary (  Last 24 hours) at 06/27/2022 1148 Last data filed at 06/27/2022 1102 Gross per 24 hour  Intake 240 ml  Output 1375 ml  Net -1135 ml      Physical Exam   General:  Chronically ill appearing. No significant resp difficulty HEENT: normal Neck: supple. JVP to ears. Carotids 2+ bilat; no bruits. No lymphadenopathy or thyromegaly appreciated. Cor: PMI nondisplaced. Regular rate, irregular rhythm. No rubs, gallops or murmurs. Lungs: coarse crackles, diminished bilateral bases Abdomen: obese, soft, nontender. No hepatosplenomegaly. No bruits or masses. Hyperactive bowel sounds. Extremities: no cyanosis, clubbing, rash. +4 bilateral lower extremity edema to thighs  Neuro: alert & orientedx3, cranial nerves grossly intact. moves all 4 extremities w/o difficulty. Affect pleasant  Telemetry   A. Fib rate controlled 70's  EKG    A fib 77 bpm Nonspecific intra-ventricular conduction delay Nonspecific T wave abnormality  Labs   Basic Metabolic Panel: Recent Labs  Lab 06/26/22 1212 06/27/22 0254 06/27/22 0805  NA 135  --  140  K 3.7  --  3.2*  CL 91*  --  91*  CO2 34*  --  36*  GLUCOSE 151*  --  76  BUN 109*  --  115*  CREATININE 3.68*  --  3.56*  CALCIUM 9.5  --  9.1  MG  --  2.8*  --   PHOS  --  5.1*  --     Liver Function Tests: Recent Labs  Lab 06/27/22 0254  AST 21  ALT 16  ALKPHOS 81  BILITOT 1.9*  PROT 7.3  ALBUMIN 3.2*   No results for input(s): "LIPASE", "AMYLASE" in the last 168 hours. Recent Labs  Lab 06/27/22 0254  AMMONIA 44*    CBC: Recent Labs  Lab 06/26/22 1443 06/27/22 0254 06/27/22 0805  WBC 4.2 3.7* 3.8*  NEUTROABS  --  2.5  --   HGB 8.6* 8.0* 8.3*  HCT 25.0* 23.1* 24.0*  MCV 82.5 82.8 82.8  PLT 119* 115* 120*    Cardiac Enzymes: Recent Labs  Lab 06/27/22 0254  CKTOTAL 74     BNP: BNP (last 3 results) Recent Labs    05/28/22 0935 06/19/22 1059 06/26/22 1212  BNP 722.5* 688.3* 999.4*    ProBNP (last 3 results) No results for input(s): "PROBNP" in the last 8760 hours.   CBG: Recent Labs  Lab 06/26/22 2151 06/27/22 0613 06/27/22 1058  GLUCAP 141* 80 90    Coagulation Studies: Recent Labs    06/27/22 0254  LABPROT 17.1*  INR 1.4*     Imaging   CT ABDOMEN PELVIS WO CONTRAST  Result Date: 06/26/2022 CLINICAL DATA:  Abdominal pain, lower extremity swelling EXAM: CT ABDOMEN AND PELVIS WITHOUT CONTRAST TECHNIQUE: Multidetector CT imaging of the abdomen and pelvis was performed following the standard protocol without IV contrast. Unenhanced CT was performed per clinician order. Lack of IV contrast limits sensitivity and specificity, especially for evaluation of abdominal/pelvic solid viscera. RADIATION DOSE REDUCTION: This exam was performed according to the departmental dose-optimization program which includes automated exposure control, adjustment of the mA and/or kV according to patient size and/or use of iterative reconstruction technique. COMPARISON:  05/09/2021 FINDINGS: Lower chest: Dense right lower lobe consolidation consistent with atelectasis. The large right pleural effusion increased since prior exam. Continued cardiomegaly without pericardial effusion. Hepatobiliary: Nodular contour of the liver capsule consistent with cirrhosis. No focal abnormality on this limited unenhanced exam. No evidence of cholelithiasis or cholecystitis. Pancreas: Stable 13 mm hypodensity ventral margin pancreatic body. Otherwise unremarkable  unenhanced appearance. Spleen: Unremarkable unenhanced appearance. Adrenals/Urinary Tract: No urinary tract calculi or obstructive uropathy. The adrenals and bladder are unremarkable. Stomach/Bowel: No bowel obstruction or ileus. Normal appendix. No bowel wall thickening or inflammatory change. Vascular/Lymphatic: There is  marked distension of the IVC above the level of an IVC filter, as well as distension of the hepatic veins. This is slightly more pronounced since prior study, likely reflects venous reflux some cardiac dysfunction. Stable position of the IVC filter below the right renal vein. Evaluation of the vascular lumen is limited without IV contrast. Stable atherosclerosis of the aorta and its branches. No pathologic adenopathy. Reproductive: Uterus and bilateral adnexa are unremarkable. Other: Subcutaneous edema throughout the lower anterior abdominal wall and dermal thickening, consistent with third spacing of fluid. Trace free fluid within the right upper quadrant. No free intraperitoneal gas. No abdominal wall hernia. Musculoskeletal: No acute or destructive bony lesions. Reconstructed images demonstrate no additional findings. IMPRESSION: 1. Cardiomegaly, with marked distension of the hepatic veins and suprarenal IVC compatible with cardiac dysfunction and venous reflux. 2. Large right pleural effusion with compressive right lower lobe atelectasis. 3. Subcutaneous edema and dermal wall thickening as above consistent with third spacing of fluid or venous congestion. 4. Nodular contour of the liver compatible with cirrhosis. 5. Trace ascites. 6. Stable indeterminate 13 mm hypodensity within the pancreatic body. If not previously evaluated, definitive characterization with nonemergent outpatient MRI with and without contrast may be useful. 7.  Aortic Atherosclerosis (ICD10-I70.0). Electronically Signed   By: Randa Ngo M.D.   On: 06/26/2022 17:39   DG Chest 2 View  Result Date: 06/26/2022 CLINICAL DATA:  Shortness of breath. EXAM: CHEST - 2 VIEW COMPARISON:  October 28, 2021. FINDINGS: Stable cardiomegaly. Increased right basilar opacity is noted most consistent with subsegmental atelectasis or edema with associated pleural effusion. Bony thorax is unremarkable. IMPRESSION: Increased right basilar opacity is noted  most consistent with subsegmental atelectasis or asymmetric edema with associated pleural effusion. Electronically Signed   By: Marijo Conception M.D.   On: 06/26/2022 15:31     Medications:     Current Medications:  aspirin EC  81 mg Oral Daily   enoxaparin (LOVENOX) injection  30 mg Subcutaneous Q24H   furosemide  80 mg Intravenous BID   insulin aspart  0-5 Units Subcutaneous QHS   insulin aspart  0-9 Units Subcutaneous TID WC   pantoprazole  40 mg Oral Q1200   potassium chloride  40 mEq Oral Q6H   sodium chloride flush  3 mL Intravenous Q12H    Infusions:  sodium chloride        Patient Profile   Rita [name] is a 65 y.o. 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 Here for Acute / Chronic diastolic HF with marked volume overload.  Assessment/Plan   1.  Acute / Chronic diastolic HF  - Prominent signs of RV failure.  9/21 echo showed EF 40-45%, global hypokinesis, D-shaped IV septum suggestive of RV pressure/volume overload, RV poorly visualized but appeared normal in size with at least mild systolic dysfunction. Cause of cardiomyopathy is uncertain.  She has history of  COVID-19 1/21, cannot rule out viral myocarditis. Chemotherapy-mediated CMP is possible (not sure what chemo she had remotely for breast cancer).  Cardiac MRI showed LV EF 41% with diffuse hypokinesis, RV mildly dilated but with EF 27% and D-shaped septum, nonspecific RV insertion site LGE.  Suspect RV > LV failure. Cardiac MRI is not suggestive of amyloidosis, no M-spike on myeloma  panel. We have not ruled out CAD (cath deferred given lack of ischemic CP and CKD). Milrinone started primarily for RV support during admission in 9/21 and titrated off. She had cardiomems placed 1/22. Echo in 1/23 showed improved LV function, EF 55-60%, mild LVH, mild RV enlargement with normal systolic function, IVC dilated, PASP 68 mmHg. NYHA class III-IIIb symptoms, functional capacity limited by body habitus as well as CHF.  - BNP 999, HsTrop flat 137>>144>>155>>161, Cr 3.56, CXR: Increased right basilar opacity is noted most consistent with subsegmental atelectasis or asymmetric edema with associated pleural effusion, CT abdomen pelvis noted cardiomegaly, marked distention of hepatic veins and suprarenal IVC, large right pleural effusion, abdominal wall edema, liver cirrhosis, trace ascites and hypodensity in pancreatic body. - She appears markedly volume overloaded on exam, Cardiomems last 20 (7/18), goal 21. - ECHO pending today - Stop carvedilol  - Continue hydralazine 37.5 mg tid + Imdur 60 mg daily.  - GFR 13, not a candidate for SGLT2i. - May need repeat RHC - No ARNI/spironolactone with elevated creatinine.  - Place compression hose.  - BLE VAS [place] pending to r/o DVT - Daily weights and Strict I&O  2. Chronic hypoxemic respiratory failure: OHS/OSA.  She was using home oxygen prior to COVID-19 PNA.  - Not currently wearing CPAP at home, has hospital one at bedside  3. Atrial fibrillation: Chronic, all ECGs since 7/21 show atrial fibrillation.  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 Rate is controlled. No overt bleeding.  - She cannot be cardioverted at this point as she cannot be chronically anticoagulated. - Evaluated by Dr. Quentin Ore for [place] but not a suitable candidate given multiple severe medical comorbidities and risks associated with general anesthesia.  4. HTN - stable, continue current regimen  5. Hx of GI bleeding: Recurrent,  from small bowel AVMs. Unable to be anticoagulated. No recent overt bleeding.  - Stable hgb recently.   6. H/o DVT/PE: Provoked (post-surgery). No longer on anticoagulation due to h/o significant GIB.  - Has IVC filter.   7. AKI on CKD Stage IV.  - Daily BMET, Cr. 3.56 today, baseline 2.6 - Denies NSAID use - Appreciate nephrology while IP - Renal US pending  8. Hypokalemia - K 3.2 - K supplementation ordered - Daily BMET  9. Right pleural effusion - s/p thoracentesis (last 08/31/20) (d/t CHF)  - CT scan this admit with large right pleural effusion - thoracentesis pending today per primary   10. DM II - continue SSI - Hgb A1c 7  11. Anemia - Suspect due to chronic low grade GI bleeding (small bowel AVMs). - Daily CBC - Hgb today 8.2. Baseline 8-9 - Iron profile stable: tSat 22, ferritin 245.   12. Cirrhosis  - CTA showed: Hepatobiliary: Nodular contour of the liver capsule consistent with cirrhosis. No focal abnormality on this limited unenhanced exam. No evidence of cholelithiasis or cholecystitis. - Denies ETOH use - Hep A,B,C (-) - AST/ALT stable - Ammonia 44, tBili 1.9  13. Thrombocytopenia  - chronic, last plt 120 - monitor for bleeding  Length of  Stay: [place], AGACNP-BC  06/27/2022, 9:11 AM  Advanced Heart Failure Team Pager [phone] (M-F; 7a - 5p)  Please contact [facility] for night-coverage after hours (4p -7a ) and weekends on [email or web address]  Patient seen and examined with the above-signed Advanced Practice Provider and/or Housestaff. I personally reviewed laboratory data, imaging studies and relevant notes. I independently examined the patient and formulated the important aspects of the plan. I have edited the note to reflect any of my changes or salient points. I have personally discussed the plan with the patient and/or family.  66 y/o woman as above with advanced RV failure and CKD IV-V. Admitted with marked volume overload and AKI. Has been  noncompliant with CPAP. CT with cardiac cirrhosis.   General:  Chronically-ill appearing. No resp difficulty HEENT: normal Neck: supple. JVP to ear Carotids 2+ bilat; no bruits. No lymphadenopathy or thryomegaly appreciated. Cor: PMI nondisplaced. Irregular rate & rhythm. 2/6 TR Lungs: clear Abdomen: obese soft, nontender, nondistended. No hepatosplenomegaly. No bruits or masses. Good bowel sounds. Extremities: no cyanosis, clubbing, rash, 3+ edema Neuro: alert & orientedx3, cranial nerves grossly intact. moves all 4 extremities w/o difficulty. 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 Massively volume overloaded on exam.   Will switch to lasix gtt at 20 and add metolazone 5 bid. If not responding can add milrinone tomorrow. With advanced RV failure not good candidate for long-term HD. D/w Dr. Marval Regal.   Glori Bickers, MD  4:29 PM

## 2022-06-27 NOTE — Consult Note (Signed)
Reason for Consult: AKI/CKD stage IV Referring Physician: Broadus John, MD  Rita [name] is an 66 y.o. female with a PMH significant for chronic combined systolic and diastolic CHF, persistent atrial fibrillation (not on anticoagulation due to GIB), HTN, DM type 2, h/o DVT/PE s/p IVC filter, chronic hypoxic respiratory failure on home O2, mod to severe pulmonary HTN, obesity, h/o breast cancer s/p surgery/chemo/radiation, and CKD stage IV who presented to [facility] ED with a 1 week history of worsening edema, abdominal distention, and SOB.  In the ED, T 97.4, P 58, BP 127/59, SpO2 96%.  Labs notable for Hgb 8.6, plt 119, BNP 999, BUN 109, Cr 3.68, K 3.7, Co2 34, T bili 1.9, alb 3.2, NH4 44.  She was admitted for acute on chronic CHF.  We were consulted due to AKI/CKD stage IV.  She is followed by Dr. Royce Macadamia in our office and her last Scr was 3 in April 2023.  She has put off education on HD.  Trend in Creatinine: Creatinine, Ser  Date/Time Value Ref Range Status  06/27/2022 08:05 AM 3.56 (H) 0.44 - 1.00 mg/dL Final  06/26/2022 12:12 PM 3.68 (H) 0.44 - 1.00 mg/dL Final  06/19/2022 10:59 AM 3.62 (H) 0.44 - 1.00 mg/dL Final  06/05/2022 04:59 PM 2.76 (H) 0.57 - 1.00 mg/dL Final  05/28/2022 09:35 AM 2.78 (H) 0.44 - 1.00 mg/dL Final  04/23/2022 03:30 PM 2.66 (H) 0.57 - 1.00 mg/dL Final  04/04/2022 10:19 AM 2.83 (H) 0.44 - 1.00 mg/dL Final  01/04/2022 10:35 AM 2.33 (H) 0.44 - 1.00 mg/dL Final  12/21/2021 10:33 AM 2.63 (H) 0.44 - 1.00 mg/dL Final  12/14/2021 10:40 AM 2.90 (H) 0.44 - 1.00 mg/dL Final  12/06/2021 11:24 AM 2.32 (H) 0.44 - 1.00 mg/dL Final  11/27/2021 09:44 AM 2.33 (H) 0.57 - 1.00 mg/dL Final  11/16/2021 11:21 AM 1.90 (H) 0.44 - 1.00 mg/dL Final  10/22/2021 01:47 PM 2.12 (H) 0.44 - 1.00 mg/dL Final  10/10/2021 02:48 PM 2.13 (H) 0.44 - 1.00 mg/dL Final  09/12/2021 10:56 AM 2.48 (H) 0.44 - 1.00 mg/dL Final  08/22/2021 11:04 AM 2.25 (H) 0.57 - 1.00 mg/dL Final  08/15/2021 08:40 AM 2.51  (H) 0.44 - 1.00 mg/dL Final  08/01/2021 02:31 PM 1.89 (H) 0.44 - 1.00 mg/dL Final  06/06/2021 02:30 PM 2.06 (H) 0.44 - 1.00 mg/dL Final  05/02/2021 10:23 AM 2.48 (H) 0.44 - 1.00 mg/dL Final  02/21/2021 10:33 AM 2.34 (H) 0.44 - 1.00 mg/dL Final  01/01/2021 04:30 PM 2.08 (H) 0.44 - 1.00 mg/dL Final  12/26/2020 02:30 PM 2.04 (H) 0.44 - 1.00 mg/dL Final  11/23/2020 11:36 AM 2.28 (H) 0.44 - 1.00 mg/dL Final  10/19/2020 10:32 AM 2.11 (H) 0.44 - 1.00 mg/dL Final  09/26/2020 10:02 AM 2.12 (H) 0.44 - 1.00 mg/dL Final  09/08/2020 04:23 AM 2.30 (H) 0.44 - 1.00 mg/dL Final  09/07/2020 08:04 PM 2.29 (H) 0.44 - 1.00 mg/dL Final  09/07/2020 01:56 PM 2.34 (H) 0.44 - 1.00 mg/dL Final  09/07/2020 05:00 AM 2.28 (H) 0.44 - 1.00 mg/dL Final  09/06/2020 05:24 AM 2.22 (H) 0.44 - 1.00 mg/dL Final  09/05/2020 04:53 AM 2.21 (H) 0.44 - 1.00 mg/dL Final  09/04/2020 05:00 AM 2.22 (H) 0.44 - 1.00 mg/dL Final  09/03/2020 04:20 AM 2.20 (H) 0.44 - 1.00 mg/dL Final  09/02/2020 03:03 AM 2.23 (H) 0.44 - 1.00 mg/dL Final  09/01/2020 04:00 PM 2.26 (H) 0.44 - 1.00 mg/dL Final  09/01/2020 03:23 AM 2.19 (H) 0.44 - 1.00 mg/dL  Final  08/31/2020 04:55 PM 2.26 (H) 0.44 - 1.00 mg/dL Final  08/31/2020 02:29 AM 2.23 (H) 0.44 - 1.00 mg/dL Final  08/30/2020 03:22 PM 2.11 (H) 0.44 - 1.00 mg/dL Final  08/29/2020 04:10 AM 1.93 (H) 0.44 - 1.00 mg/dL Final  08/28/2020 04:55 AM 1.88 (H) 0.44 - 1.00 mg/dL Final  08/27/2020 05:10 PM 2.06 (H) 0.44 - 1.00 mg/dL Final  08/02/2020 10:27 AM 1.76 (H) 0.57 - 1.00 mg/dL Final  07/26/2020 08:58 AM 1.59 (H) 0.44 - 1.00 mg/dL Final  07/25/2020 04:11 AM 1.51 (H) 0.44 - 1.00 mg/dL Final  07/24/2020 04:23 AM 1.53 (H) 0.44 - 1.00 mg/dL Final  07/23/2020 07:09 AM 1.52 (H) 0.44 - 1.00 mg/dL Final  07/22/2020 06:59 AM 1.64 (H) 0.44 - 1.00 mg/dL Final  07/21/2020 04:39 AM 1.88 (H) 0.44 - 1.00 mg/dL Final  07/20/2020 04:32 AM 1.71 (H) 0.44 - 1.00 mg/dL Final    PMH:   Past Medical History:  Diagnosis  Date   Acute hypoxemic respiratory failure due to COVID-19 ([place]) 12/30/2019   Anemia    Arthritis    Atrial fibrillation (HCC)    Chronic kidney disease    Chronic respiratory failure with hypoxia, on home O2 therapy ([place])    CKD (chronic kidney disease), stage III ([place])    COVID-19    Diabetes mellitus without complication (HCC)    Diastolic CHF ([place])    GI bleed    GI bleed    Gout    Hypertension    Thrombocytopenia (HCC)     PSH:   Past Surgical History:  Procedure Laterality Date   HERNIA REPAIR  02/2019   IR THORACENTESIS ASP PLEURAL SPACE W/IMG GUIDE  06/28/2020   IR THORACENTESIS ASP PLEURAL SPACE W/IMG GUIDE  08/31/2020   PRESSURE SENSOR/CARDIOMEMS N/A 12/26/2020   Procedure: PRESSURE SENSOR/CARDIOMEMS;  Surgeon: Larey Dresser, MD;  Location: [facility];  Service: Cardiovascular;  Laterality: N/A;   RIGHT HEART CATH N/A 09/07/2020   Procedure: RIGHT HEART CATH;  Surgeon: Larey Dresser, MD;  Location: [facility];  Service: Cardiovascular;  Laterality: N/A;    Allergies:  Allergies  Allergen Reactions   Codeine Other (See Comments)    Mouth sores    Prednisone Other (See Comments)    Delusions    Moxifloxacin Rash    Medications:   Prior to Admission medications   Medication Sig Start Date End Date Taking? Authorizing Provider  acetaminophen (TYLENOL) 325 MG tablet Take 650 mg by mouth every 6 (six) hours as needed for mild pain or headache.   Yes [provider]  allopurinol (ZYLOPRIM) 100 MG tablet Take 200 mg by mouth daily. 04/20/22  Yes [provider]  aspirin EC 81 MG tablet Take 81 mg by mouth daily. Swallow whole.   Yes [provider]  carvedilol (COREG) 6.25 MG tablet Take 1 tablet (6.25 mg total) by mouth 2 (two) times daily with a meal. 10/19/20 08/01/22 Yes Simmons, Brittainy M, PA-C  glipiZIDE (GLUCOTROL XL) 5 MG 24 hr tablet Take 5 mg by mouth daily with breakfast.  06/08/20  Yes [provider]   hydrALAZINE (APRESOLINE) 25 MG tablet Take 1.5 tablets (37.5 mg total) by mouth 3 (three) times daily. 03/01/22  Yes Larey Dresser, MD  isosorbide mononitrate (IMDUR) 30 MG 24 hr tablet Take 30 mg by mouth daily. 04/20/22  Yes [provider]  Menthol-Methyl Salicylate (MUSCLE RUB) 10-15 % CREA Apply 1 application topically as needed  for muscle pain (knees).    Yes [provider]  metolazone (ZAROXOLYN) 2.5 MG tablet Take 1 tablet (2.5 mg total) by mouth once a week. On Saturdays with an extra potassium tablet 01/04/22  Yes Milford, Maricela Bo, FNP  Multiple Vitamin (MULTIVITAMIN WITH MINERALS) TABS tablet Take 2 tablets by mouth daily. Alive gummy bears   Yes [provider]  Multiple Vitamins-Minerals (VITAMIN D3 COMPLETE PO) Take 25 mcg by mouth daily.   Yes [provider]  polyethylene glycol (MIRALAX / GLYCOLAX) 17 g packet Take 17 g by mouth daily.   Yes [provider]  potassium chloride SA (KLOR-CON M) 20 MEQ tablet Take 40 mEq by mouth 2 (two) times daily.   Yes [provider]  torsemide (DEMADEX) 20 MG tablet Take 5 tablets (100 mg total) by mouth 2 (two) times daily. Patient taking differently: Take 60-100 mg by mouth 2 (two) times daily. Take 5 tablets (100 mg) in the morning and Take 3 tablets (60 mg) in afternoon 05/28/22  Yes Milford, Maricela Bo, FNP  blood glucose meter kit and supplies Dispense based on patient and insurance preference. Use up to four times daily as directed. (FOR ICD-10 E10.9, E11.9). 01/02/20   Ghimire, Henreitta Leber, MD  Blood Glucose Monitoring Suppl (FIFTY50 GLUCOSE METER 2.0) w/Device KIT See admin instructions. 09/15/20   [provider]  isosorbide mononitrate (IMDUR) 60 MG 24 hr tablet Take 1 tablet (60 mg total) by mouth daily. Needs appointment for further refill Patient not taking: Reported on 06/26/2022 02/02/21   Larey Dresser, MD  OXYGEN Inhale 2 L/min into the lungs at bedtime. prn     [provider]    Inpatient medications:  aspirin EC  81 mg Oral Daily   enoxaparin (LOVENOX) injection  30 mg Subcutaneous Q24H   furosemide  80 mg Intravenous BID   insulin aspart  0-5 Units Subcutaneous QHS   insulin aspart  0-9 Units Subcutaneous TID WC   pantoprazole  40 mg Oral Q1200   potassium chloride  40 mEq Oral Q6H   sodium chloride flush  3 mL Intravenous Q12H    Discontinued Meds:   Medications Discontinued During This Encounter  Medication Reason   pantoprazole (PROTONIX) 40 MG tablet Discontinued by provider   furosemide (LASIX) injection 40 mg    carvedilol (COREG) tablet 6.25 mg    pantoprazole (PROTONIX) EC tablet 40 mg    aspirin chewable tablet 81 mg     Social History:  reports that she has never smoked. She has never used smokeless tobacco. She reports that she does not drink alcohol and does not use drugs.  Family History:   Family History  Problem Relation Age of Onset   Cancer Mother     Pertinent items are noted in HPI. Weight change:   Intake/Output Summary (Last 24 hours) at 06/27/2022 1406 Last data filed at 06/27/2022 1102 Gross per 24 hour  Intake 240 ml  Output 1375 ml  Net -1135 ml   BP (!) 120/52   Pulse 73   Temp 97.6 F (36.4 C) (Oral)   Resp 18   Ht 5' 1" (1.549 m)   Wt 93 kg   SpO2 95%   BMI 38.74 kg/m  Vitals:   06/27/22 0331 06/27/22 0720 06/27/22 1000 06/27/22 1100  BP: (!) 117/58 (!) 102/47 133/70 (!) 120/52  Pulse: 74 73 75 73  Resp: _0 Temp: 98.4 F (36.9 C) 98.2 F (36.8  C)  97.6 F (36.4 C)  TempSrc: Oral Oral  Oral  SpO2: 98% 96% 100% 95%  Weight:      Height:         General appearance: no distress and moderately obese Head: Normocephalic, without obvious abnormality, atraumatic Resp: diminished breath sounds RLL Cardio: irregularly irregular rhythm and no rub GI: soft, non-tender; bowel sounds normal; no masses,  no organomegaly Extremities: edema 2+ edema of bilateral lower  extremities  Labs: Basic Metabolic Panel: Recent Labs  Lab 06/26/22 1212 06/27/22 0254 06/27/22 0805 06/27/22 1119  NA 135  --  140  --   K 3.7  --  3.2*  --   CL 91*  --  91*  --   CO2 34*  --  36*  --   GLUCOSE 151*  --  76  --   BUN 109*  --  115*  --   CREATININE 3.68*  --  3.56*  --   ALBUMIN  --  3.2*  --   --   CALCIUM 9.5  --  9.1  --   PHOS  --  5.1*  --  4.8*   Liver Function Tests: Recent Labs  Lab 06/27/22 0254  AST 21  ALT 16  ALKPHOS 81  BILITOT 1.9*  PROT 7.3  ALBUMIN 3.2*   No results for input(s): "LIPASE", "AMYLASE" in the last 168 hours. Recent Labs  Lab 06/27/22 0254  AMMONIA 44*   CBC: Recent Labs  Lab 06/26/22 1443 06/27/22 0254 06/27/22 0805  WBC 4.2 3.7* 3.8*  NEUTROABS  --  2.5  --   HGB 8.6* 8.0* 8.3*  HCT 25.0* 23.1* 24.0*  MCV 82.5 82.8 82.8  PLT 119* 115* 120*   PT/INR: _0 (inr:5) Cardiac Enzymes: ) Recent Labs  Lab 06/27/22 0254  CKTOTAL 74   CBG: Recent Labs  Lab 06/26/22 2151 06/27/22 0613 06/27/22 1058  GLUCAP 141* 80 90    Iron Studies:  Recent Labs  Lab 06/27/22 0254  IRON 50  TIBC 225*  FERRITIN 245    Xrays/Other Studies: VAS [place] LOWER EXTREMITY VENOUS (DVT)  Result Date: 06/27/2022  Lower Venous DVT Study Patient Name:  Rita [name]  Date of Exam:   06/27/2022 Medical Rec #: [id number]                   Accession #:    [id number] Date of Birth: 1956/03/20                    Patient Gender: F Patient Age:   77 years Exam Location:  [facility] Procedure:      VAS [place] LOWER EXTREMITY VENOUS (DVT) Referring Phys: Nyoka Lint DOUTOVA --------------------------------------------------------------------------------  Indications: Edema.  Risk Factors: Obesity and CHF, CKD, Afib, pulmonary HTN, cirrhosis. Limitations: Body habitus and poor ultrasound/tissue interface. Comparison Study: No previous exams Performing Technologist: Jody Hill RVT, RDMS  Examination Guidelines: A complete  evaluation includes B-mode imaging, spectral Doppler, color Doppler, and power Doppler as needed of all accessible portions of each vessel. Bilateral testing is considered an integral part of a complete examination. Limited examinations for reoccurring indications may be performed as noted. The reflux portion of the exam is performed with the patient in reverse Trendelenburg.  +---------+---------------+---------+-----------+----------+-------------------+ RIGHT    CompressibilityPhasicitySpontaneityPropertiesThrombus Aging      +---------+---------------+---------+-----------+----------+-------------------+ CFV      Full           No       Yes                                      +---------+---------------+---------+-----------+----------+-------------------+  SFJ      Full                                                             +---------+---------------+---------+-----------+----------+-------------------+ FV Prox  Full           No       Yes                                      +---------+---------------+---------+-----------+----------+-------------------+ FV Mid   Full           No       Yes                                      +---------+---------------+---------+-----------+----------+-------------------+ FV DistalFull           No       Yes                                      +---------+---------------+---------+-----------+----------+-------------------+ PFV      Full                                                             +---------+---------------+---------+-----------+----------+-------------------+ POP      Full           No       Yes                                      +---------+---------------+---------+-----------+----------+-------------------+ PTV      Full                                         Not well visualized +---------+---------------+---------+-----------+----------+-------------------+ PERO     Full                                          Not well visualized +---------+---------------+---------+-----------+----------+-------------------+ Pulsatile flow throughout RLE. Several varicosties seen in area of groin, patent by color and compression.  +---------+---------------+---------+-----------+----------+-------------------+ LEFT     CompressibilityPhasicitySpontaneityPropertiesThrombus Aging      +---------+---------------+---------+-----------+----------+-------------------+ CFV      Full           No       Yes                                      +---------+---------------+---------+-----------+----------+-------------------+ SFJ      Full                                                             +---------+---------------+---------+-----------+----------+-------------------+  FV Prox  Full           No       Yes                                      +---------+---------------+---------+-----------+----------+-------------------+ FV Mid   Full           No       Yes                                      +---------+---------------+---------+-----------+----------+-------------------+ FV DistalFull           No       Yes                                      +---------+---------------+---------+-----------+----------+-------------------+ PFV      Full                                                             +---------+---------------+---------+-----------+----------+-------------------+ POP      Full           No       Yes                                      +---------+---------------+---------+-----------+----------+-------------------+ PTV      Full                                         Not well visualized +---------+---------------+---------+-----------+----------+-------------------+ PERO     Full                                         Not well visualized +---------+---------------+---------+-----------+----------+-------------------+ Pulsatile  flow throughout LLE.    Summary: BILATERAL: - No evidence of deep vein thrombosis seen in the lower extremities, bilaterally. -No evidence of popliteal cyst, bilaterally.   *See table(s) above for measurements and observations.    Preliminary    CT ABDOMEN PELVIS WO CONTRAST  Result Date: 06/26/2022 CLINICAL DATA:  Abdominal pain, lower extremity swelling EXAM: CT ABDOMEN AND PELVIS WITHOUT CONTRAST TECHNIQUE: Multidetector CT imaging of the abdomen and pelvis was performed following the standard protocol without IV contrast. Unenhanced CT was performed per clinician order. Lack of IV contrast limits sensitivity and specificity, especially for evaluation of abdominal/pelvic solid viscera. RADIATION DOSE REDUCTION: This exam was performed according to the departmental dose-optimization program which includes automated exposure control, adjustment of the mA and/or kV according to patient size and/or use of iterative reconstruction technique. COMPARISON:  05/09/2021 FINDINGS: Lower chest: Dense right lower lobe consolidation consistent with atelectasis. The large right pleural effusion increased since prior exam. Continued cardiomegaly without pericardial effusion. Hepatobiliary: Nodular contour of the liver capsule consistent with cirrhosis. No focal abnormality on this limited unenhanced exam.  No evidence of cholelithiasis or cholecystitis. Pancreas: Stable 13 mm hypodensity ventral margin pancreatic body. Otherwise unremarkable unenhanced appearance. Spleen: Unremarkable unenhanced appearance. Adrenals/Urinary Tract: No urinary tract calculi or obstructive uropathy. The adrenals and bladder are unremarkable. Stomach/Bowel: No bowel obstruction or ileus. Normal appendix. No bowel wall thickening or inflammatory change. Vascular/Lymphatic: There is marked distension of the IVC above the level of an IVC filter, as well as distension of the hepatic veins. This is slightly more pronounced since prior study, likely  reflects venous reflux some cardiac dysfunction. Stable position of the IVC filter below the right renal vein. Evaluation of the vascular lumen is limited without IV contrast. Stable atherosclerosis of the aorta and its branches. No pathologic adenopathy. Reproductive: Uterus and bilateral adnexa are unremarkable. Other: Subcutaneous edema throughout the lower anterior abdominal wall and dermal thickening, consistent with third spacing of fluid. Trace free fluid within the right upper quadrant. No free intraperitoneal gas. No abdominal wall hernia. Musculoskeletal: No acute or destructive bony lesions. Reconstructed images demonstrate no additional findings. IMPRESSION: 1. Cardiomegaly, with marked distension of the hepatic veins and suprarenal IVC compatible with cardiac dysfunction and venous reflux. 2. Large right pleural effusion with compressive right lower lobe atelectasis. 3. Subcutaneous edema and dermal wall thickening as above consistent with third spacing of fluid or venous congestion. 4. Nodular contour of the liver compatible with cirrhosis. 5. Trace ascites. 6. Stable indeterminate 13 mm hypodensity within the pancreatic body. If not previously evaluated, definitive characterization with nonemergent outpatient MRI with and without contrast may be useful. 7.  Aortic Atherosclerosis (ICD10-I70.0). Electronically Signed   By: Randa Ngo M.D.   On: 06/26/2022 17:39   DG Chest 2 View  Result Date: 06/26/2022 CLINICAL DATA:  Shortness of breath. EXAM: CHEST - 2 VIEW COMPARISON:  October 28, 2021. FINDINGS: Stable cardiomegaly. Increased right basilar opacity is noted most consistent with subsegmental atelectasis or edema with associated pleural effusion. Bony thorax is unremarkable. IMPRESSION: Increased right basilar opacity is noted most consistent with subsegmental atelectasis or asymmetric edema with associated pleural effusion. Electronically Signed   By: Marijo Conception M.D.   On: 06/26/2022  15:31     Assessment/Plan:  AKI/CKD stage IV - presumably due to cardiorenal syndrome.  Continue with IV diuresis and follow UOP and daily Scr levels.  No urgent indication for dialysis patients at this time.  Agree with increasing diuretics and possible milrinone per Heart failure team.  Will continue to follow.   Renal dose meds for eGFR <15.   Avoid nephrotoxic agents such as IV contrast, NSAIDs/Cox-II I's, phosphate containing bowel preps (FLEETS). Acute on chronic combined systolic and diastolic CHF - Advanced heart failure team following. Chronic hypoxemic respiratory failure - due to OHS/OSA.  Cpap in hospital.  Atrial fibrillation -  chronic.  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 HTN - stable H/o DVT/PE - s/p IVC Right pleural effusion - s/p right thoracentesis today at IR and removed 1.2 Liters Cardiac cirrhosis - GI consulted.  Workup underway.   [name] 06/27/2022, 2:06 PM

## 2022-06-27 NOTE — Progress Notes (Signed)
PT Cancellation Note  Patient Details Name: [name] MRN: [id number] DOB: May 03, 1956   Cancelled Treatment:    Reason Eval/Treat Not Completed: Patient declined, no reason specified;Fatigue/lethargy limiting ability to participate. PT attempted to see pt twice on this date, on first attempt pt undergoing ECHO, on 2nd attempt pt eating dinner and declines evaluation at this time. PT will follow up as time allows.   Zenaida Niece 06/27/2022, 5:22 PM

## 2022-06-27 NOTE — Procedures (Signed)
PROCEDURE SUMMARY:  Successful image-guided right thoracentesis. Yielded 1.2 L of hazy amber fluid. Pt tolerated procedure well. No immediate complications. EBL = trace   Specimen was sent for labs. CXR ordered.  Please see imaging section of Epic for full dictation.  Armando Gang Hadar Elgersma PA-C 06/27/2022 2:44 PM

## 2022-06-27 NOTE — Evaluation (Signed)
Occupational Therapy Evaluation Patient Details Name: Rita [name] MRN: [id number] DOB: 22-Nov-1956 Today's Date: 06/27/2022   History of Present Illness 66 y.o. F admitted on 06/26/22 due to increased O2 needs and swelling. PMH significant for diabetes mellitus diastolic CHF chronic respiratory failure on 1 to 2 L of oxygen anemia, atrial fibrillation, CKD stage III hypertension thrombocytopenia GI bleed.   Clinical Impression   Pt admitted for concerns listed above. PTA pt reported that she was independent with a ll ADL's and IADL's, using a rollator. At this time, pt is requiring increased time, and min guard for safety. She fatigues quickly and requires seated and/or standing breaks. SpO2 remaining over 90% throughout entire session on 2L. Recommending HHOT to maximize independence and safety. OT will follow acutely.       Recommendations for follow up therapy are one component of a multi-disciplinary discharge planning process, led by the attending physician.  Recommendations may be updated based on patient status, additional functional criteria and insurance authorization.   Follow Up Recommendations  Home health OT    Assistance Recommended at Discharge Set up Supervision/Assistance  Patient can return home with the following A little help with walking and/or transfers;A little help with bathing/dressing/bathroom;Assistance with cooking/housework    Functional Status Assessment  Patient has had a recent decline in their functional status and demonstrates the ability to make significant improvements in function in a reasonable and predictable amount of time.  Equipment Recommendations  None recommended by OT    Recommendations for Other Services       Precautions / Restrictions Precautions Precautions: Fall Restrictions Weight Bearing Restrictions: No      Mobility Bed Mobility Overal bed mobility: Modified Independent                   Transfers Overall transfer level: Needs assistance Equipment used: Rolling walker (2 wheels) Transfers: Sit to/from Stand Sit to Stand: Min guard                  Balance Overall balance assessment: Mild deficits observed, not formally tested                                         ADL either performed or assessed with clinical judgement   ADL Overall ADL's : Needs assistance/impaired Eating/Feeding: Set up;Sitting   Grooming: Set up;Sitting   Upper Body Bathing: Set up;Sitting   Lower Body Bathing: Min guard;Sitting/lateral leans;Sit to/from stand   Upper Body Dressing : Set up;Sitting   Lower Body Dressing: Min guard;Sitting/lateral leans;Sit to/from stand   Toilet Transfer: Min guard;Ambulation   Toileting- Clothing Manipulation and Hygiene: Min guard;Sitting/lateral lean;Sit to/from stand       Functional mobility during ADLs: Min guard;Rolling walker (2 wheels)       Vision Baseline Vision/History: 1 Wears glasses Ability to See in Adequate Light: 0 Adequate Patient Visual Report: No change from baseline Vision Assessment?: No apparent visual deficits     Perception     Praxis      Pertinent Vitals/Pain Pain Assessment Pain Assessment: No/denies pain     Hand Dominance Right   Extremity/Trunk Assessment Upper Extremity Assessment Upper Extremity Assessment: Generalized weakness   Lower Extremity Assessment Lower Extremity Assessment: Generalized weakness   Cervical / Trunk Assessment Cervical / Trunk Assessment: Kyphotic   Communication Communication Communication: No difficulties   Cognition Arousal/Alertness: Awake/alert Behavior  During Therapy: WFL for tasks assessed/performed Overall Cognitive Status: Within Functional Limits for tasks assessed                                       General Comments  VSS on 2L - pt very SoB once returned to supine after bed linens were changed. Requesting bump  in O2, however O2 remained over 94%    Exercises     Shoulder Instructions      Home Living Family/patient expects to be discharged to:: Private residence Living Arrangements: Spouse/significant other;Children Available Help at Discharge: Family;Available 24 hours/day Type of Home: House Home Access: Stairs to enter [employer] of Steps: 2 steps Entrance Stairs-Rails: None Home Layout: One level     Bathroom Shower/Tub: Teacher, early years/pre: Handicapped height Bathroom Accessibility: Yes How Accessible: Accessible via walker Home Equipment: Rollator (4 wheels)          Prior Functioning/Environment Prior Level of Function : Needs assist             Mobility Comments: Uses a rollator, gets assist for stairs. ADLs Comments: Completes all ADL's with no assist, except for donning and doffing shoes and socks. She sponge bathes at baseline.        OT Problem List: Decreased strength;Decreased activity tolerance;Impaired balance (sitting and/or standing);Cardiopulmonary status limiting activity      OT Treatment/Interventions: Self-care/ADL training;Therapeutic exercise;Energy conservation;DME and/or AE instruction;Therapeutic activities;Patient/family education;Balance training    OT Goals(Current goals can be found in the care plan section) Acute Rehab OT Goals Patient Stated Goal: To breath better OT Goal Formulation: With patient Time For Goal Achievement: 07/11/22 Potential to Achieve Goals: Good ADL Goals Pt Will Perform Grooming: with modified independence;standing Pt Will Perform Lower Body Bathing: with modified independence;sitting/lateral leans;sit to/from stand Pt Will Perform Lower Body Dressing: with modified independence;sitting/lateral leans;sit to/from stand Pt Will Transfer to Toilet: with modified independence;ambulating Pt Will Perform Toileting - Clothing Manipulation and hygiene: with modified independence;sitting/lateral  leans;sit to/from stand  OT Frequency: Min 2X/week    Co-evaluation              AM-PAC OT "6 Clicks" Daily Activity     Outcome Measure Help from another person eating meals?: A Little Help from another person taking care of personal grooming?: A Little Help from another person toileting, which includes using toliet, bedpan, or urinal?: A Little Help from another person bathing (including washing, rinsing, drying)?: A Little Help from another person to put on and taking off regular upper body clothing?: A Little Help from another person to put on and taking off regular lower body clothing?: A Little 6 Click Score: 18   End of Session Equipment Utilized During Treatment: Rolling walker (2 wheels) Nurse Communication: Mobility status  Activity Tolerance: Patient tolerated treatment well Patient left: in bed;with call bell/phone within reach;with family/visitor present  OT Visit Diagnosis: Unsteadiness on feet (R26.81);Other abnormalities of gait and mobility (R26.89);Muscle weakness (generalized) (M62.81)                Time: 4268-3419 OT Time Calculation (min): 31 min Charges:  OT General Charges $OT Visit: 1 Visit OT Evaluation $OT Eval Moderate Complexity: 1 Mod OT Treatments $Self Care/Home Management : 8-22 mins  Gessica Jawad H., OTR/L Acute Rehabilitation  Wynnie Pacetti Elane Yolanda Bonine 06/27/2022, 3:56 PM

## 2022-06-27 NOTE — Progress Notes (Signed)
  Echocardiogram 2D Echocardiogram has been performed.  Rita [name] 06/27/2022, 4:54 PM

## 2022-06-27 NOTE — Progress Notes (Signed)
Patient refused use of CPAP for the evening 

## 2022-06-27 NOTE — Progress Notes (Signed)
Pt has serum osmolarity of 331. Made DR Hal Hope aware. Pt has renal Ultrasound today.  BP (!) 117/58 (BP Location: Right Arm)   Pulse 74   Temp 98.4 F (36.9 C) (Oral)   Resp 18   Ht 5\' 1"  (1.549 m)   Wt 93 kg   SpO2 98%   BMI 38.74 kg/m

## 2022-06-27 NOTE — Progress Notes (Addendum)
PROGRESS NOTE    Derya Dettmann  CBU:[id number] DOB: 10-Sep-1956 DOA: 06/26/2022 PCP: [name], MD  66/F with history of chronic systolic CHF, EF recovered, CKD 4, history of chronic GI bleed/SB AVMs, paroxysmal A-fib not on anticoagulation, type 2 diabetes mellitus, history of DVT/PE, IVC filter, chronic respiratory failure on 2 L home O2, obesity presented to the ED with worsening edema, abdominal distention.  Seen in clinic a week ago, torsemide dose increased -CT abdomen pelvis noted cardiomegaly, marked distention of hepatic veins and suprarenal IVC, large right pleural effusion, abdominal wall edema, liver cirrhosis, trace ascites and hypodensity in pancreatic body   Subjective: -Breathing a little better from last night  Assessment and Plan:  Acute on chronic systolic CHF Large right pleural effusion -BiV failure, predominantly right heart failure -EF recovered to 55%, on echo 1/23 -Now profoundly volume overloaded with large right effusion, anasarca and 3+ edema -Follow-up repeat echo -Continue IV Lasix, increased dose to 80 Mg twice daily - hold Coreg, -Creatinine is higher than baseline, may need right heart cath, advanced heart failure team consulted -Thoracentesis in radiology today -GDMT limited by CKD 4/AKI -Place Unna boots  AKI on CKD 4 -Baseline creatinine around 2.6, now 3.7 -Suspect this is cardiorenal, diuretics as above, avoid hypotension -Advanced heart failure team consulted, followed by Dr.Foster -Will request nephrology input  Chronic anemia -Secondary to CKD 4 and chronic GI bleed, stable, monitor  Cirrhosis -Secondary to right heart failure, no history of chronic hepatitis or alcoholism -Diuretics as above, avoid Aldactone with CKD 4  Chronic hypoxic respiratory failure -Reportedly CPAP was taken away from her home recently, -Continue CPAP nightly, TOC consult for home CPAP  Chronic A-fib -Rate controlled, Coreg on hold at this  time, not on anticoagulation due to recurrent GI bleeds from small bowel AVMs  Hypodensity in pancreatic body -Noted incidentally on CT abdomen, needs follow-up  History of recurrent GI bleeds -Hemoglobin stable at this time, monitor  History of DVT/PE -Following orthopedic surgery, has IVC filter  DVT prophylaxis: Add Lovenox Code Status: Full code Family Communication: Discussed with patient detail, no family at bedside Disposition Plan: Anticipate long hospitalization  Consultants:    Procedures:   Antimicrobials:    Objective: Vitals:   06/27/22 0200 06/27/22 0331 06/27/22 0720 06/27/22 1000  BP:  (!) 117/58 (!) 102/47 133/70  Pulse:  74 73 75  Resp:  18 20 16   Temp:  98.4 F (36.9 C) 98.2 F (36.8 C)   TempSrc:  Oral Oral   SpO2:  98% 96% 100%  Weight: 93 kg     Height: 5\' 1"  (1.549 m)       Intake/Output Summary (Last 24 hours) at 06/27/2022 1057 Last data filed at 06/27/2022 0857 Gross per 24 hour  Intake 240 ml  Output 1100 ml  Net -860 ml   Filed Weights   06/26/22 1618 06/27/22 0200  Weight: 85.3 kg 93 kg    Examination:  General exam: Appears calm and comfortable  Respiratory system: Clear to auscultation Cardiovascular system: S1 & S2 heard, RRR.  Abd: nondistended, soft and nontender.Normal bowel sounds heard. Central nervous system: Alert and oriented. No focal neurological deficits. Extremities: no edema Skin: No rashes Psychiatry:  Mood & affect appropriate.     Data Reviewed:   CBC: Recent Labs  Lab 06/26/22 1443 06/27/22 0254 06/27/22 0805  WBC 4.2 3.7* 3.8*  NEUTROABS  --  2.5  --   HGB 8.6* 8.0* 8.3*  HCT  25.0* 23.1* 24.0*  MCV 82.5 82.8 82.8  PLT 119* 115* 601*   Basic Metabolic Panel: Recent Labs  Lab 06/26/22 1212 06/27/22 0254 06/27/22 0805  NA 135  --  140  K 3.7  --  3.2*  CL 91*  --  91*  CO2 34*  --  36*  GLUCOSE 151*  --  76  BUN 109*  --  115*  CREATININE 3.68*  --  3.56*  CALCIUM 9.5  --  9.1  MG   --  2.8*  --   PHOS  --  5.1*  --    GFR: Estimated Creatinine Clearance: 15.7 mL/min (A) (by C-G formula based on SCr of 3.56 mg/dL (H)). Liver Function Tests: Recent Labs  Lab 06/27/22 0254  AST 21  ALT 16  ALKPHOS 81  BILITOT 1.9*  PROT 7.3  ALBUMIN 3.2*   No results for input(s): "LIPASE", "AMYLASE" in the last 168 hours. Recent Labs  Lab 06/27/22 0254  AMMONIA 44*   Coagulation Profile: Recent Labs  Lab 06/27/22 0254  INR 1.4*   Cardiac Enzymes: Recent Labs  Lab 06/27/22 0254  CKTOTAL 74   BNP (last 3 results) No results for input(s): "PROBNP" in the last 8760 hours. HbA1C: Recent Labs    06/27/22 0254  HGBA1C 7.0*   CBG: Recent Labs  Lab 06/26/22 2151 06/27/22 0613  GLUCAP 141* 80   Lipid Profile: No results for input(s): "CHOL", "HDL", "LDLCALC", "TRIG", "CHOLHDL", "LDLDIRECT" in the last 72 hours. Thyroid Function Tests: Recent Labs    06/27/22 0254  TSH 0.592   Anemia Panel: Recent Labs    06/27/22 0254  VITAMINB12 829  FOLATE 39.3  FERRITIN 245  TIBC 225*  IRON 50  RETICCTPCT 0.9   Urine analysis:    Component Value Date/Time   COLORURINE STRAW (A) 06/26/2022 2036   APPEARANCEUR CLEAR 06/26/2022 2036   LABSPEC 1.006 06/26/2022 2036   PHURINE 5.0 06/26/2022 2036   GLUCOSEU NEGATIVE 06/26/2022 2036   HGBUR NEGATIVE 06/26/2022 2036   BILIRUBINUR NEGATIVE 06/26/2022 2036   KETONESUR NEGATIVE 06/26/2022 2036   PROTEINUR NEGATIVE 06/26/2022 2036   NITRITE NEGATIVE 06/26/2022 2036   LEUKOCYTESUR NEGATIVE 06/26/2022 2036   Sepsis Labs: @LABRCNTIP (procalcitonin:4,lacticidven:4)  )No results found for this or any previous visit (from the past 240 hour(s)).   Radiology Studies: CT ABDOMEN PELVIS WO CONTRAST  Result Date: 06/26/2022 CLINICAL DATA:  Abdominal pain, lower extremity swelling EXAM: CT ABDOMEN AND PELVIS WITHOUT CONTRAST TECHNIQUE: Multidetector CT imaging of the abdomen and pelvis was performed following the  standard protocol without IV contrast. Unenhanced CT was performed per clinician order. Lack of IV contrast limits sensitivity and specificity, especially for evaluation of abdominal/pelvic solid viscera. RADIATION DOSE REDUCTION: This exam was performed according to the departmental dose-optimization program which includes automated exposure control, adjustment of the mA and/or kV according to patient size and/or use of iterative reconstruction technique. COMPARISON:  05/09/2021 FINDINGS: Lower chest: Dense right lower lobe consolidation consistent with atelectasis. The large right pleural effusion increased since prior exam. Continued cardiomegaly without pericardial effusion. Hepatobiliary: Nodular contour of the liver capsule consistent with cirrhosis. No focal abnormality on this limited unenhanced exam. No evidence of cholelithiasis or cholecystitis. Pancreas: Stable 13 mm hypodensity ventral margin pancreatic body. Otherwise unremarkable unenhanced appearance. Spleen: Unremarkable unenhanced appearance. Adrenals/Urinary Tract: No urinary tract calculi or obstructive uropathy. The adrenals and bladder are unremarkable. Stomach/Bowel: No bowel obstruction or ileus. Normal appendix. No bowel wall thickening or inflammatory change. Vascular/Lymphatic:  There is marked distension of the IVC above the level of an IVC filter, as well as distension of the hepatic veins. This is slightly more pronounced since prior study, likely reflects venous reflux some cardiac dysfunction. Stable position of the IVC filter below the right renal vein. Evaluation of the vascular lumen is limited without IV contrast. Stable atherosclerosis of the aorta and its branches. No pathologic adenopathy. Reproductive: Uterus and bilateral adnexa are unremarkable. Other: Subcutaneous edema throughout the lower anterior abdominal wall and dermal thickening, consistent with third spacing of fluid. Trace free fluid within the right upper quadrant.  No free intraperitoneal gas. No abdominal wall hernia. Musculoskeletal: No acute or destructive bony lesions. Reconstructed images demonstrate no additional findings. IMPRESSION: 1. Cardiomegaly, with marked distension of the hepatic veins and suprarenal IVC compatible with cardiac dysfunction and venous reflux. 2. Large right pleural effusion with compressive right lower lobe atelectasis. 3. Subcutaneous edema and dermal wall thickening as above consistent with third spacing of fluid or venous congestion. 4. Nodular contour of the liver compatible with cirrhosis. 5. Trace ascites. 6. Stable indeterminate 13 mm hypodensity within the pancreatic body. If not previously evaluated, definitive characterization with nonemergent outpatient MRI with and without contrast may be useful. 7.  Aortic Atherosclerosis (ICD10-I70.0). Electronically Signed   By: Randa Ngo M.D.   On: 06/26/2022 17:39   DG Chest 2 View  Result Date: 06/26/2022 CLINICAL DATA:  Shortness of breath. EXAM: CHEST - 2 VIEW COMPARISON:  October 28, 2021. FINDINGS: Stable cardiomegaly. Increased right basilar opacity is noted most consistent with subsegmental atelectasis or edema with associated pleural effusion. Bony thorax is unremarkable. IMPRESSION: Increased right basilar opacity is noted most consistent with subsegmental atelectasis or asymmetric edema with associated pleural effusion. Electronically Signed   By: Marijo Conception M.D.   On: 06/26/2022 15:31     Scheduled Meds:  aspirin  81 mg Oral Daily   enoxaparin (LOVENOX) injection  30 mg Subcutaneous Q24H   furosemide  80 mg Intravenous BID   insulin aspart  0-5 Units Subcutaneous QHS   insulin aspart  0-9 Units Subcutaneous TID WC   pantoprazole  40 mg Oral Q1200   potassium chloride  40 mEq Oral Q6H   Continuous Infusions:   LOS: 1 day    Time spent: 82min    Domenic Polite, MD [facility]   06/27/2022, 10:57 AM

## 2022-06-27 NOTE — Progress Notes (Signed)
BLE venous duplex has been completed.   Results can be found under chart review under CV PROC. 06/27/2022 12:09 PM Erian Rosengren RVT, RDMS

## 2022-06-28 DIAGNOSIS — E440 Moderate protein-calorie malnutrition: Secondary | ICD-10-CM | POA: Insufficient documentation

## 2022-06-28 DIAGNOSIS — I5033 Acute on chronic diastolic (congestive) heart failure: Secondary | ICD-10-CM | POA: Diagnosis not present

## 2022-06-28 LAB — COMPREHENSIVE METABOLIC PANEL
ALT: 13 U/L (ref 0–44)
AST: 20 U/L (ref 15–41)
Albumin: 3.1 g/dL — ABNORMAL LOW (ref 3.5–5.0)
Alkaline Phosphatase: 79 U/L (ref 38–126)
Anion gap: 12 (ref 5–15)
BUN: 103 mg/dL — ABNORMAL HIGH (ref 8–23)
CO2: 35 mmol/L — ABNORMAL HIGH (ref 22–32)
Calcium: 9.1 mg/dL (ref 8.9–10.3)
Chloride: 92 mmol/L — ABNORMAL LOW (ref 98–111)
Creatinine, Ser: 3.66 mg/dL — ABNORMAL HIGH (ref 0.44–1.00)
GFR, Estimated: 13 mL/min — ABNORMAL LOW (ref >60)
Glucose, Bld: 125 mg/dL — ABNORMAL HIGH (ref 70–99)
Potassium: 3.7 mmol/L (ref 3.5–5.1)
Sodium: 139 mmol/L (ref 135–145)
Total Bilirubin: 1.9 mg/dL — ABNORMAL HIGH (ref 0.3–1.2)
Total Protein: 6.9 g/dL (ref 6.5–8.1)

## 2022-06-28 LAB — CBC
HCT: 24 % — ABNORMAL LOW (ref 36.0–46.0)
Hemoglobin: 8.2 g/dL — ABNORMAL LOW (ref 12.0–15.0)
MCH: 28.2 pg (ref 26.0–34.0)
MCHC: 34.2 g/dL (ref 30.0–36.0)
MCV: 82.5 fL (ref 80.0–100.0)
Platelets: 129 10*3/uL — ABNORMAL LOW (ref 150–400)
RBC: 2.91 MIL/uL — ABNORMAL LOW (ref 3.87–5.11)
RDW: 13.8 % (ref 11.5–15.5)
WBC: 4.5 10*3/uL (ref 4.0–10.5)
nRBC: 0 % (ref 0.0–0.2)

## 2022-06-28 LAB — MAGNESIUM: Magnesium: 2.7 mg/dL — ABNORMAL HIGH (ref 1.7–2.4)

## 2022-06-28 LAB — GLUCOSE, CAPILLARY
Glucose-Capillary: 116 mg/dL — ABNORMAL HIGH (ref 70–99)
Glucose-Capillary: 244 mg/dL — ABNORMAL HIGH (ref 70–99)
Glucose-Capillary: 194 mg/dL — ABNORMAL HIGH (ref 70–99)
Glucose-Capillary: 218 mg/dL — ABNORMAL HIGH (ref 70–99)

## 2022-06-28 LAB — CYTOLOGY - NON PAP

## 2022-06-28 LAB — ECHOCARDIOGRAM COMPLETE
Height: 61 in
S' Lateral: 2.6 cm
Weight: 3280.44 oz

## 2022-06-28 IMAGING — DX DG CHEST 2V
2 series · 2 of 2 positions shown · non-contrast
Comparison: July 19, 2020

CLINICAL DATA: Shortness of breath for 3 or 4 days.

EXAM:
CHEST - 2 VIEW

[chest lat]
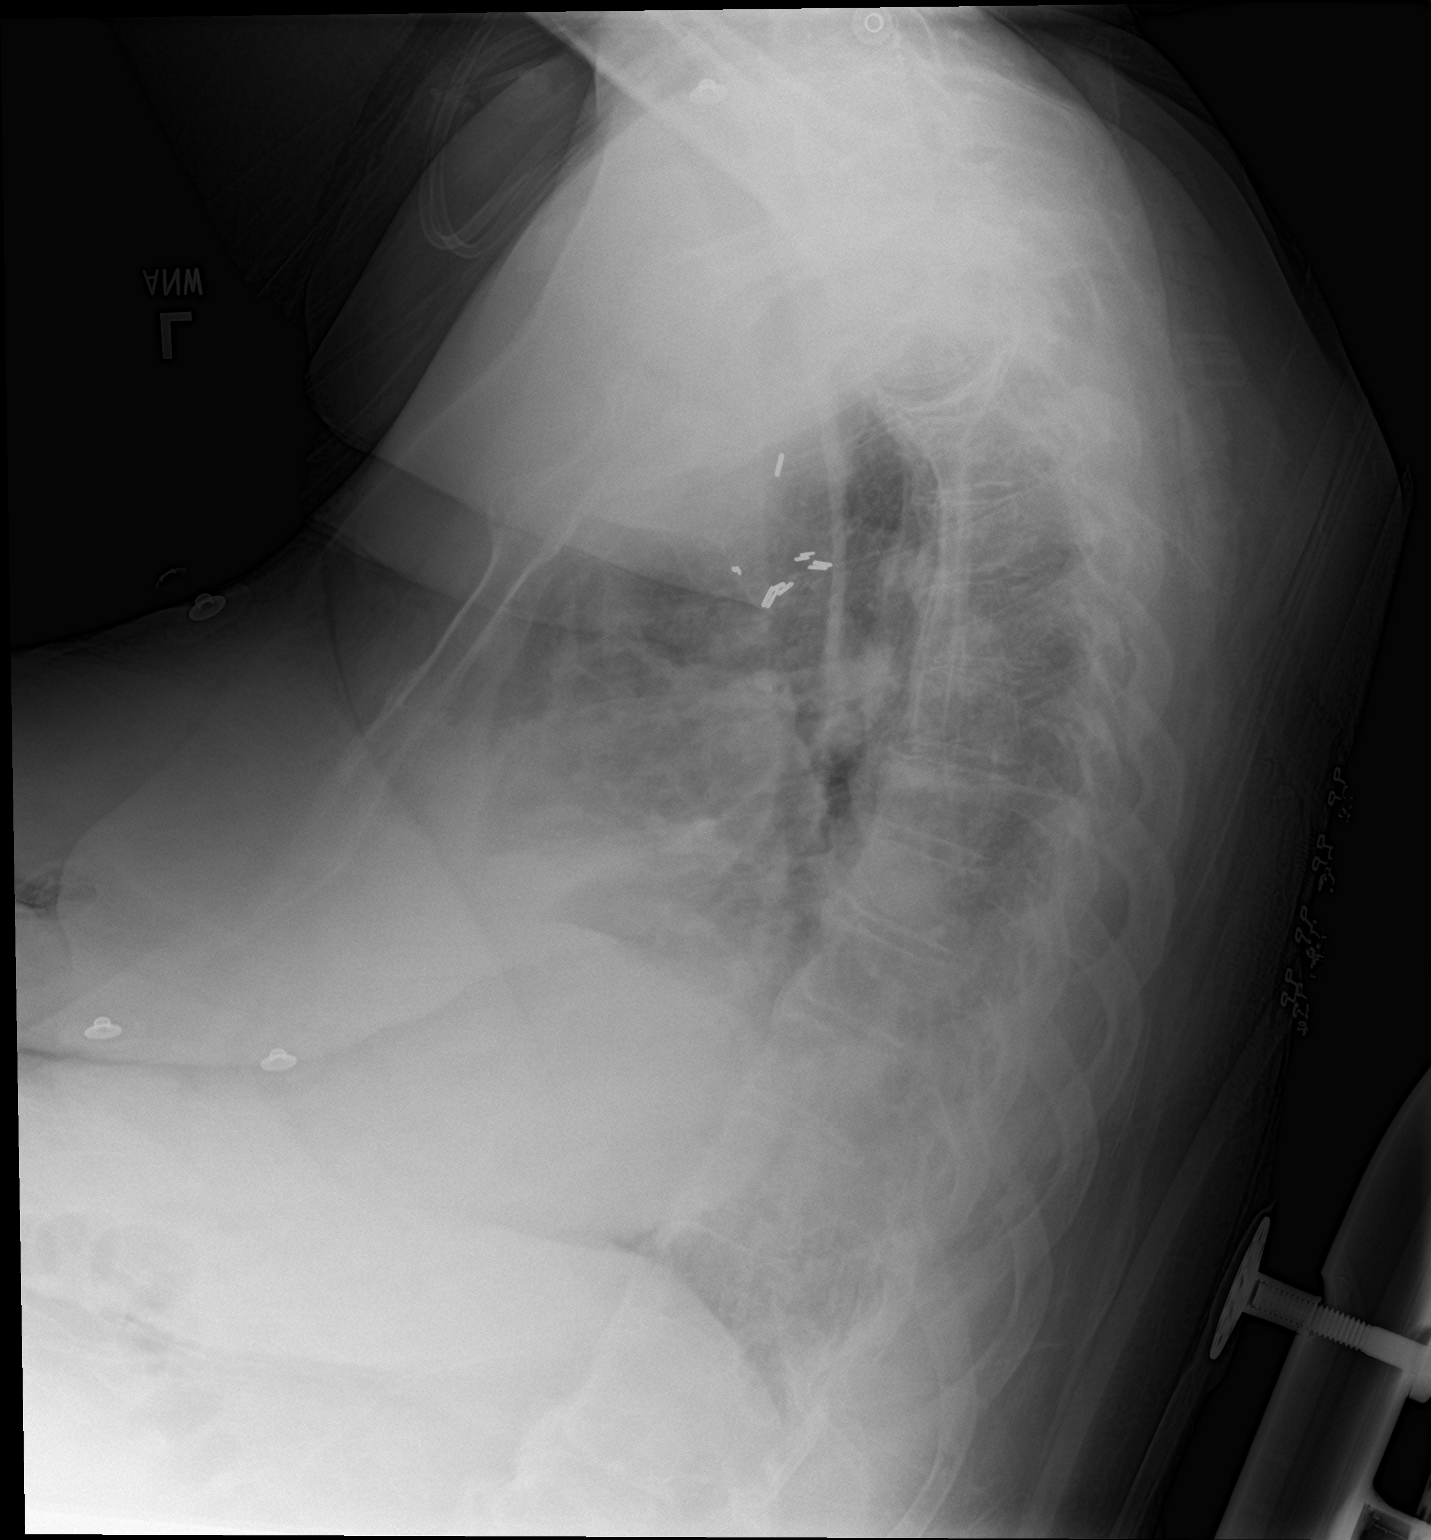

[chest ap]
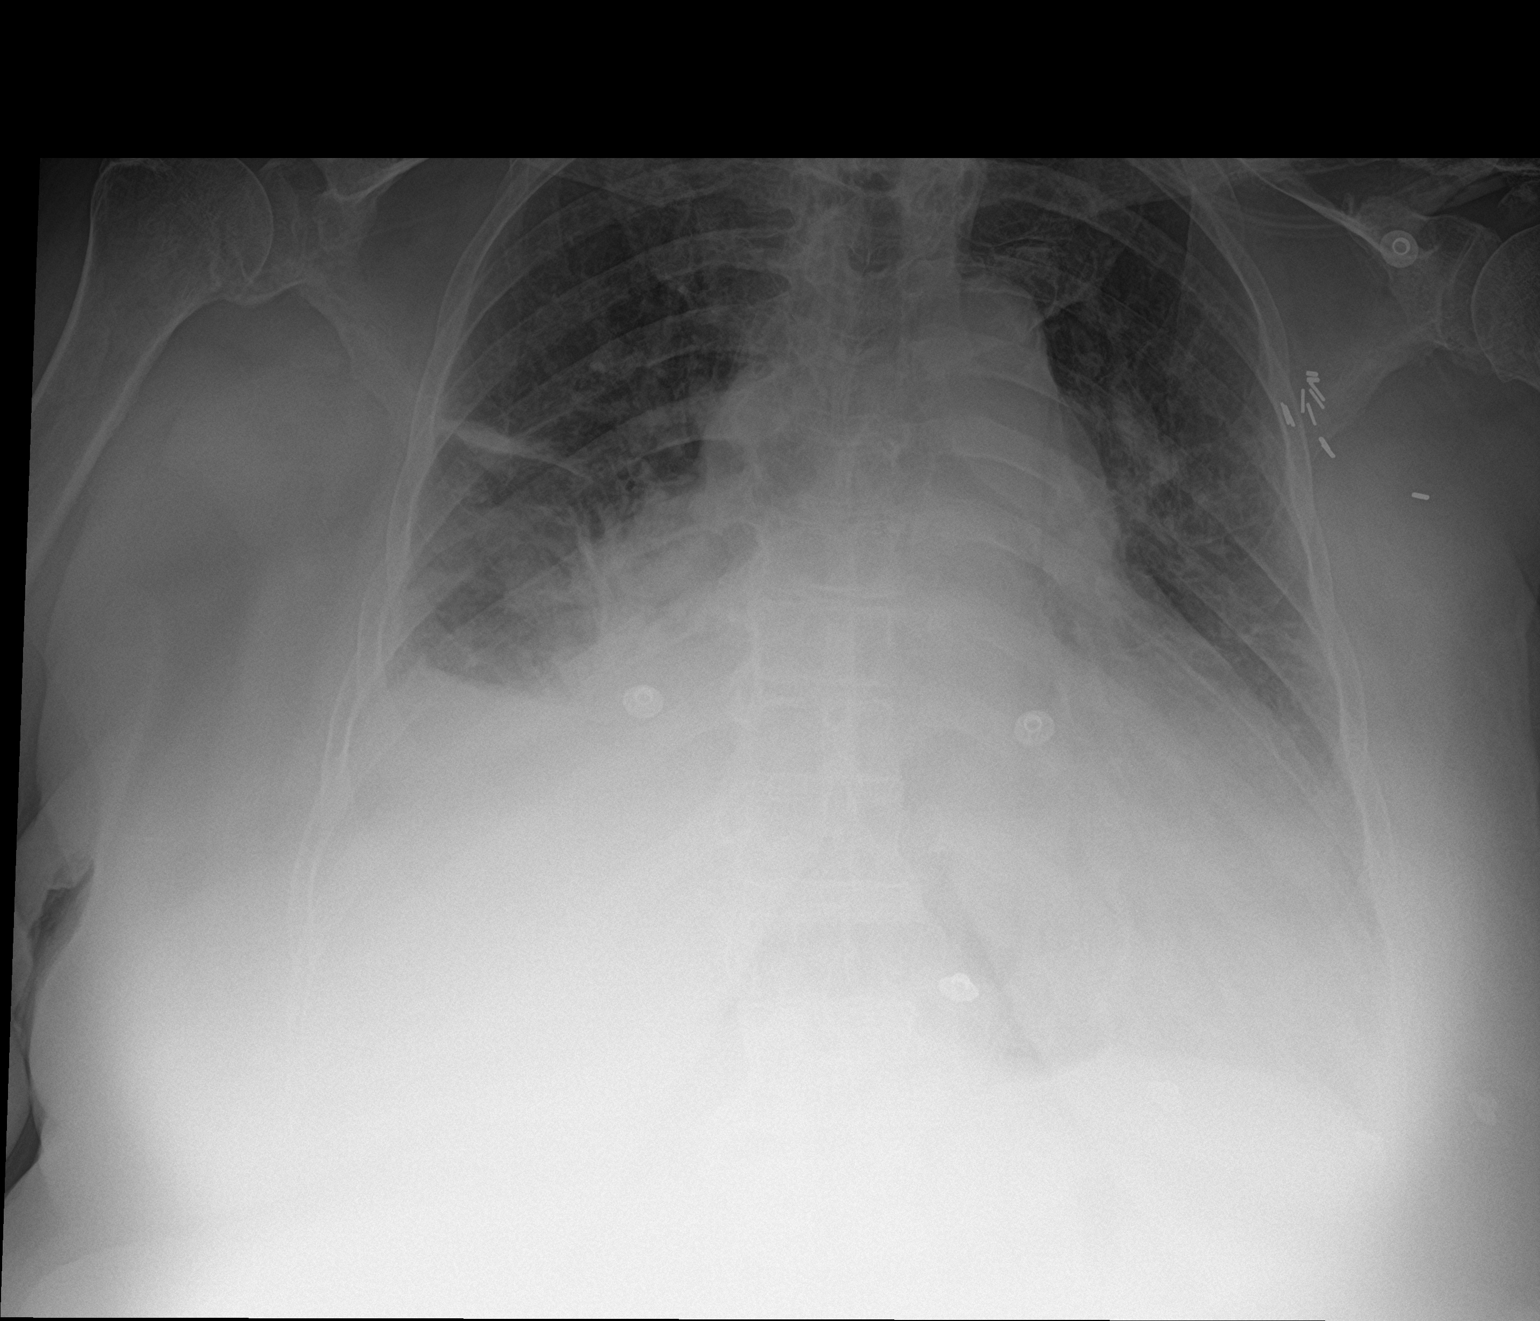

[2 of 2 positions shown; findings below may reference images not displayed]

FINDINGS: Stable cardiomegaly. The hila and mediastinum are unchanged. No
pneumothorax. Pulmonary venous congestion. The opacity in the right
base has increased in the interval, likely representing increasing
pleural fluid with underlying opacity. No other interval changes.
IMPRESSION: 1. Cardiomegaly and pulmonary venous congestion.
2. Increasing pleural effusion and underlying opacity on the right.

## 2022-06-28 MED ORDER — MILRINONE LACTATE IN DEXTROSE 20-5 MG/100ML-% IV SOLN
0.1250 ug/kg/min | INTRAVENOUS | Status: DC
  Administered 2022-06-28 – 2022-06-29 (×2): 0.125 ug/kg/min via INTRAVENOUS
  Filled 2022-06-28 (×3): qty 100

## 2022-06-28 MED ORDER — HYDROCERIN EX CREA
TOPICAL_CREAM | Freq: Two times a day (BID) | CUTANEOUS | Status: DC
  Administered 2022-07-01 – 2022-07-03 (×2): 1 via TOPICAL
  Filled 2022-06-28: qty 113

## 2022-06-28 MED ORDER — POTASSIUM CHLORIDE CRYS ER 20 MEQ PO TBCR
40.0000 meq | EXTENDED_RELEASE_TABLET | Freq: Once | ORAL | Status: AC
  Administered 2022-06-28: 40 meq via ORAL
  Filled 2022-06-28: qty 2

## 2022-06-28 NOTE — Progress Notes (Addendum)
Advanced Heart Failure Rounding Note  PCP-Cardiologist: None   Subjective:    ECHO yesterday: LVEF 60-65%, The interventricular septum is flattened in systole and diastole, consistent w/ RV pressure and volume overload. RV mildly enlarged. 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 Signed off. Would not do further testing at the moment.   Nephrology saw yesterday: no urgent need for HD at the moment, will continue to follow.  R Thoracentesis yesterday, tolerated well. -1.2 L hazy amber fluid  Switched from 80mg  lasix BID to lasix gtt. Given 5mg  metolazone x2. -3lbs overall  Denies CP, SOB, willing to work with PT today   Objective:   Weight Range: 92 kg Body mass index is 38.32 kg/m.   Vital Signs:   Temp:  [97.6 F (36.4 C)-98 F (36.7 C)] 97.8 F (36.6 C) (07/21 0500) Pulse Rate:  [73-75] 75 (07/21 0500) Resp:  [16-18] 18 (07/21 0500) BP: (116-133)/(48-79) 129/79 (07/21 0500) SpO2:  [91 %-100 %] 93 % (07/21 0500) Weight:  [92 kg] 92 kg (07/21 0500) Last BM Date : 06/25/22  Weight change: Filed Weights   06/26/22 1618 06/27/22 0200 06/28/22 0500  Weight: 85.3 kg 93 kg 92 kg    Intake/Output:   Intake/Output Summary (Last 24 hours) at 06/28/2022 0743 Last data filed at 06/28/2022 0500 Gross per 24 hour  Intake 648.62 ml  Output 2275 ml  Net -1626.38 ml      Physical Exam    General:  chronically ill appearing. No respiratory difficulty HEENT: normal Neck: supple. JVD to jaw. Carotids 2+ bilat; no bruits. No lymphadenopathy or thyromegaly appreciated. Cor: PMI nondisplaced. Regular rate & irregular rhythm. No rubs, gallops or murmurs. Lungs: clear, diminished at bases Abdomen: obese, soft, nontender, nondistended. No hepatosplenomegaly. No bruits or masses. Hyperactive bowel sounds. Extremities: no cyanosis, clubbing,  rash. Edema +3 BLE  Neuro: alert & oriented x 3, cranial nerves grossly intact. moves all 4 extremities w/o difficulty. Affect pleasant.  Telemetry   A fib in 70's (Personally reviewed)    EKG    No new EKG to review  Labs    CBC Recent Labs    06/27/22 0254 06/27/22 0805 06/28/22 0340  WBC 3.7* 3.8* 4.5  NEUTROABS 2.5  --   --   HGB 8.0* 8.3* 8.2*  HCT 23.1* 24.0* 24.0*  MCV 82.8 82.8 82.5  PLT 115* 120* 659*   Basic Metabolic Panel Recent Labs    06/27/22 0254 06/27/22 0805 06/27/22 1119 06/28/22 0340  NA  --  140  --  139  K  --  3.2*  --  3.7  CL  --  91*  --  92*  CO2  --  36*  --  35*  GLUCOSE  --  76  --  125*  BUN  --  115*  --  103*  CREATININE  --  3.56*  --  3.66*  CALCIUM  --  9.1  --  9.1  MG 2.8*  --  3.0* 2.7*  PHOS 5.1*  --  4.8*  --    Liver Function Tests Recent Labs    06/27/22 0254 06/28/22 0340  AST 21 20  ALT 16 13  ALKPHOS 81 79  BILITOT 1.9* 1.9*  PROT 7.3 6.9  ALBUMIN 3.2* 3.1*   No results for input(s): "LIPASE", "AMYLASE" in the last 72 hours. Cardiac Enzymes Recent  Labs    06/27/22 0254  CKTOTAL 74    BNP: BNP (last 3 results) Recent Labs    05/28/22 0935 06/19/22 1059 06/26/22 1212  BNP 722.5* 688.3* 999.4*    ProBNP (last 3 results) No results for input(s): "PROBNP" in the last 8760 hours.   D-Dimer No results for input(s): "DDIMER" in the last 72 hours. Hemoglobin A1C Recent Labs    06/27/22 0254  HGBA1C 7.0*   Fasting Lipid Panel No results for input(s): "CHOL", "HDL", "LDLCALC", "TRIG", "CHOLHDL", "LDLDIRECT" in the last 72 hours. Thyroid Function Tests Recent Labs    06/27/22 0254  TSH 0.592    Other results:   Imaging    ECHOCARDIOGRAM COMPLETE  Result Date: 06/28/2022    ECHOCARDIOGRAM REPORT   Patient Name:   [facility] MARBLE Falin Date of Exam: 06/27/2022 Medical Rec #:  [id number]                  Height:       61.0 in Accession #:    [id number]                 Weight:        205.0 lb Date of Birth:  [birth year]                   BSA:          1.909 m Patient Age:    66 years                   BP:           117/56 mmHg Patient Gender: F                          HR:           70 bpm. Exam Location:  Inpatient Procedure: 2D Echo Indications:    acute diastolic chf  History:        Patient has prior history of Echocardiogram examinations, most                 recent 12/28/2021. Chronic kidney disease. Cirrhosis,                 Arrythmias:Atrial Fibrillation, Signs/Symptoms:Edema; Risk                 Factors:Diabetes, Hypertension and Sleep Apnea.  Sonographer:    Johny Chess RDCS Referring Phys: [place]  1. Left ventricular ejection fraction, by estimation, is 60 to 65%. The left ventricle has normal function. The left ventricle has no regional wall motion abnormalities. There is mild left ventricular hypertrophy of the lateral segment. Left ventricular  diastolic parameters are indeterminate. There is the interventricular septum is flattened in systole and diastole, consistent with right ventricular pressure and volume overload.  2. Right ventricular systolic function is normal. The right ventricular size is mildly enlarged. There is severely elevated pulmonary artery systolic pressure.  3. Left atrial size was severely dilated.  4. There is a hyperechoic density on the posterior mitral valve annulus measuring 0.8 x 0.7 cm. This was present on the echo 08/2020, though it has increased in size. Likely represents mital annular calcification. The mitral valve is normal in structure.  Trivial mitral valve regurgitation. No evidence of mitral stenosis.  5. Tricuspid valve regurgitation is severe.  6. The aortic valve is tricuspid. Aortic valve regurgitation is not visualized. No aortic stenosis is  present.  7. The inferior vena cava is dilated in size with <50% respiratory variability, suggesting right atrial pressure of 15 mmHg. FINDINGS  Left Ventricle: Left  ventricular ejection fraction, by estimation, is 60 to 65%. The left ventricle has normal function. The left ventricle has no regional wall motion abnormalities. The left ventricular internal cavity size was normal in size. There is  mild left ventricular hypertrophy of the lateral segment. The interventricular septum is flattened in systole and diastole, consistent with right ventricular pressure and volume overload. Left ventricular diastolic function could not be evaluated due to  atrial fibrillation. Left ventricular diastolic parameters are indeterminate. Right Ventricle: The right ventricular size is mildly enlarged. No increase in right ventricular wall thickness. Right ventricular systolic function is normal. There is severely elevated pulmonary artery systolic pressure. The tricuspid regurgitant velocity is 3.44 m/s, and with an assumed right atrial pressure of 15 mmHg, the estimated right ventricular systolic pressure is 70.0 mmHg. Left Atrium: Left atrial size was severely dilated. Right Atrium: Right atrial size was normal in size. Pericardium: There is no evidence of pericardial effusion. Mitral Valve: There is a hyperechoic density on the posterior mitral valve annulus measuring 0.8 x 0.7 cm. This was present on the echo 08/2020, though it has increased in size. Likely represents mital annular calcification. The mitral valve is normal in structure. Mild to moderate mitral annular calcification. Trivial mitral valve regurgitation. No evidence of mitral valve stenosis. Tricuspid Valve: The tricuspid valve is normal in structure. Tricuspid valve regurgitation is severe. No evidence of tricuspid stenosis. Aortic Valve: The aortic valve is tricuspid. Aortic valve regurgitation is not visualized. No aortic stenosis is present. Pulmonic Valve: The pulmonic valve was normal in structure. Pulmonic valve regurgitation is not visualized. No evidence of pulmonic stenosis. Aorta: The aortic root is normal in size  and structure. Venous: The inferior vena cava is dilated in size with less than 50% respiratory variability, suggesting right atrial pressure of 15 mmHg. IAS/Shunts: No atrial level shunt detected by color flow Doppler.  LEFT VENTRICLE PLAX 2D LVIDd:         4.10 cm LVIDs:         2.60 cm LV PW:         1.20 cm LV IVS:        0.90 cm LVOT diam:     1.70 cm LV SV:         46 LV SV Index:   24 LVOT Area:     2.27 cm  RIGHT VENTRICLE            IVC RV Basal diam:  3.40 cm    IVC diam: 3.10 cm RV S prime:     9.36 cm/s TAPSE (M-mode): 1.1 cm LEFT ATRIUM              Index        RIGHT ATRIUM           Index LA diam:        4.10 cm  2.15 cm/m   RA Area:     24.70 cm LA Vol (A2C):   116.0 ml 60.77 ml/m  RA Volume:   74.20 ml  38.87 ml/m LA Vol (A4C):   62.1 ml  32.53 ml/m LA Biplane Vol: 86.7 ml  45.42 ml/m  AORTIC VALVE LVOT Vmax:   111.00 cm/s LVOT Vmean:  71.700 cm/s LVOT VTI:    0.202 m  AORTA Ao Root diam: 2.50 cm Ao Asc  diam:  3.00 cm TRICUSPID VALVE TR Peak grad:   47.3 mmHg TR Vmax:        344.00 cm/s  SHUNTS Systemic VTI:  0.20 m Systemic Diam: 1.70 cm Skeet Latch MD Electronically signed by Skeet Latch MD Signature Date/Time: 06/28/2022/6:20:54 AM    Final    VAS [place] LOWER EXTREMITY VENOUS (DVT)  Result Date: 06/27/2022  Lower Venous DVT Study Patient Name:  Rita [name] Co  Date of Exam:   06/27/2022 Medical Rec #: [id number]                   Accession #:    [id number] Date of Birth: 1956/09/16                    Patient Gender: F Patient Age:   39 years Exam Location:  [facility] Procedure:      VAS [place] LOWER EXTREMITY VENOUS (DVT) Referring Phys: Nyoka Lint DOUTOVA --------------------------------------------------------------------------------  Indications: Edema.  Risk Factors: Obesity and Hx of DVT s/p IVC filter, CHF, CKD, Afib, pulmonary HTN, cirrhosis. Limitations: Body habitus and poor ultrasound/tissue interface. Comparison Study: No previous exams available for  review Performing Technologist: Jody Hill RVT, RDMS  Examination Guidelines: A complete evaluation includes B-mode imaging, spectral Doppler, color Doppler, and power Doppler as needed of all accessible portions of each vessel. Bilateral testing is considered an integral part of a complete examination. Limited examinations for reoccurring indications may be performed as noted. The reflux portion of the exam is performed with the patient in reverse Trendelenburg.  +---------+---------------+---------+-----------+----------+-------------------+ RIGHT    CompressibilityPhasicitySpontaneityPropertiesThrombus Aging      +---------+---------------+---------+-----------+----------+-------------------+ CFV      Full           No       Yes                                      +---------+---------------+---------+-----------+----------+-------------------+ SFJ      Full                                                             +---------+---------------+---------+-----------+----------+-------------------+ FV Prox  Full           No       Yes                                      +---------+---------------+---------+-----------+----------+-------------------+ FV Mid   Full           No       Yes                                      +---------+---------------+---------+-----------+----------+-------------------+ FV DistalFull           No       Yes                                      +---------+---------------+---------+-----------+----------+-------------------+ PFV      Full                                                             +---------+---------------+---------+-----------+----------+-------------------+  POP      Full           No       Yes                                      +---------+---------------+---------+-----------+----------+-------------------+ PTV      Full                                         Not well visualized  +---------+---------------+---------+-----------+----------+-------------------+ PERO     Full                                         Not well visualized +---------+---------------+---------+-----------+----------+-------------------+ Pulsatile flow throughout RLE. Several varicosties seen in area of groin, patent by color and compression.  +---------+---------------+---------+-----------+----------+-------------------+ LEFT     CompressibilityPhasicitySpontaneityPropertiesThrombus Aging      +---------+---------------+---------+-----------+----------+-------------------+ CFV      Full           No       Yes                                      +---------+---------------+---------+-----------+----------+-------------------+ SFJ      Full                                                             +---------+---------------+---------+-----------+----------+-------------------+ FV Prox  Full           No       Yes                                      +---------+---------------+---------+-----------+----------+-------------------+ FV Mid   Full           No       Yes                                      +---------+---------------+---------+-----------+----------+-------------------+ FV DistalFull           No       Yes                                      +---------+---------------+---------+-----------+----------+-------------------+ PFV      Full                                                             +---------+---------------+---------+-----------+----------+-------------------+ POP      Full           No       Yes                                      +---------+---------------+---------+-----------+----------+-------------------+  PTV      Full                                         Not well visualized +---------+---------------+---------+-----------+----------+-------------------+ PERO     Full                                          Not well visualized +---------+---------------+---------+-----------+----------+-------------------+ Pulsatile flow throughout LLE.    Summary: BILATERAL: - No evidence of deep vein thrombosis seen in the lower extremities, bilaterally. -No evidence of popliteal cyst, bilaterally.   *See table(s) above for measurements and observations. Electronically signed by Jamelle Haring on 06/27/2022 at 8:22:06 PM.    Final    US RENAL  Result Date: 06/27/2022 CLINICAL DATA:  Acute renal insufficiency EXAM: RENAL / URINARY TRACT ULTRASOUND COMPLETE COMPARISON:  06/26/2022 FINDINGS: Right Kidney: Renal measurements: 9.0 x 4.8 x 4.5 cm = volume: 100 mL. Echogenicity within normal limits. No mass or hydronephrosis visualized. Left Kidney: Renal measurements: 9.1 x 4.4 x 5.1 cm = volume: 106 mL. Echogenicity within normal limits. No mass or hydronephrosis visualized. Bladder: Appears normal for degree of bladder distention. Other: None. IMPRESSION: 1. Unremarkable renal ultrasound. Electronically Signed   By: Randa Ngo M.D.   On: 06/27/2022 16:27   DG Chest 1 View  Result Date: 06/27/2022 CLINICAL DATA:  A 66 year old female presents following thoracentesis on the RIGHT post removal of 1.2 L by report. EXAM: CHEST  1 VIEW COMPARISON:  06/26/2022. FINDINGS: EKG leads project over the chest. Trachea midline. Cardiomediastinal contours with cardiac enlargement and engorgement of central pulmonary vasculature. Bibasilar airspace disease with improved aeration at the RIGHT lung base following near complete resolution of pleural fluid. No pneumothorax. On limited assessment there is no acute skeletal finding. IMPRESSION: Improved aeration at the RIGHT lung base following near complete resolution of right effusion. No pneumothorax. Persistent bibasilar airspace disease and marked cardiomegaly. Electronically Signed   By: Zetta Bills M.D.   On: 06/27/2022 14:57     Medications:     Scheduled Medications:  aspirin EC   81 mg Oral Daily   enoxaparin (LOVENOX) injection  30 mg Subcutaneous Q24H   insulin aspart  0-5 Units Subcutaneous QHS   insulin aspart  0-9 Units Subcutaneous TID WC   lactose free nutrition  237 mL Oral TID WC   metolazone  5 mg Oral BID   pantoprazole  40 mg Oral Q1200   sodium chloride flush  3 mL Intravenous Q12H    Infusions:  sodium chloride     furosemide (LASIX) 200 mg in dextrose 5 % 100 mL (2 mg/mL) infusion 20 mg/hr (06/28/22 0452)    PRN Medications: sodium chloride, acetaminophen **OR** acetaminophen, albuterol, HYDROcodone-acetaminophen, lidocaine (PF), polyethylene glycol, sodium chloride flush    Patient Profile   Ms Pain is a 66 y.o. 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 Here for Acute / Chronic  diastolic HF with marked volume overload.  Assessment/Plan   1.  Acute / Chronic diastolic HF  - Prominent signs of RV failure.  9/21 echo showed EF 40-45%, global hypokinesis, D-shaped IV septum suggestive of RV pressure/volume overload, RV poorly visualized but appeared normal in size with at least mild systolic dysfunction. Cause of cardiomyopathy is uncertain.  She has history of COVID-19 1/21, cannot rule out viral myocarditis. Chemotherapy-mediated CMP is possible (not sure what chemo she had remotely for breast cancer).  Cardiac MRI showed LV EF 41% with diffuse hypokinesis, RV mildly dilated but with EF 27% and D-shaped septum, nonspecific RV insertion site LGE.  Suspect RV > LV failure. Cardiac MRI is not suggestive of  amyloidosis, no M-spike on myeloma panel. We have not ruled out CAD (cath deferred given lack of ischemic CP and CKD). Milrinone started primarily for RV support during admission in 9/21 and titrated off. She had cardiomems placed 1/22. Echo in 1/23 showed improved LV function, EF 55-60%, mild LVH, mild RV enlargement with normal systolic function, IVC dilated, PASP 68 mmHg. NYHA class III-IIIb symptoms, functional capacity limited by body habitus as well as CHF.  - BNP 999, HsTrop flat 137>>144>>155>>161, Cr 3.56, CXR: Increased right basilar opacity is noted most consistent with subsegmental atelectasis or asymmetric edema with associated pleural effusion, CT abdomen pelvis noted cardiomegaly, marked distention of hepatic veins and suprarenal IVC, large right pleural effusion, abdominal wall edema, liver cirrhosis, trace ascites and hypodensity in pancreatic body. - She appears volume overloaded on exam, Cardiomems last 20 (7/18), goal 21.  - ECHO yesterday: LVEF 60-65%, The interventricular septum is flattened in systole and diastole, consistent w/ RV pressure and volume overload. RV mildly enlarged. RV function normal, severaly elevated PASP, LA severely dilated, RA normal size, mital annular calcification, trivial MR, severe TR - Continue to hold carvedilol  - Continue to hold hydralazine 37.5 mg tid + Imdur 60 mg daily.  - GFR 13, not a candidate for SGLT2i. - May need repeat RHC, pending further diuresis - Start milrinone today at 0.125 - No ARNI/spironolactone with elevated creatinine.  - Place compression hose, unna boots ordered - BLE VAS [place] 7/20: (-) DVT - Daily weights and Strict I&O   2. Chronic hypoxemic respiratory failure: OHS/OSA.  She was using home oxygen prior to COVID-19 PNA.  - Not currently wearing CPAP at home, has hospital one at bedside but refused to use overnight d/t sudden headache per patient. - Stable on O2 via [place]   3. Atrial fibrillation: Chronic, all ECGs since 7/21  show atrial fibrillation.  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 Rate is controlled. No overt bleeding.  - She cannot be cardioverted at this point as she cannot be chronically anticoagulated. - Evaluated by Dr. Quentin Ore for [place] but not a suitable candidate given multiple severe medical comorbidities and risks associated with general anesthesia.   4. HTN - stable, continue current regimen   5. Hx of GI bleeding: Recurrent, from small bowel AVMs. Unable to be anticoagulated. No recent overt bleeding.  - Stable hgb recently.    6. H/o DVT/PE: Provoked (post-surgery). No longer on anticoagulation due to h/o significant GIB.  - Has IVC filter.    7. AKI on CKD Stage IV.  - Daily BMET, Cr. 3.6 today, baseline 2.6 - Denies NSAID use - Appreciate nephrology while IP, no HD needed at the moment per nephro - Renal US 06/27/22: Unremarkable renal ultrasound.  8. Hypokalemia - K 3.7, stable - Daily BMET   9. Right pleural effusion - s/p thoracentesis (last 08/31/20) (d/t CHF)  - CT scan this admit with large right pleural effusion - thoracentesis 06/27/22- Yielded 1.2 L of hazy amber fluid, cultures NGTD   10. DM II - continue SSI - Hgb A1c 7   11. Anemia - Suspect due to chronic low grade GI bleeding (small bowel AVMs). - Daily CBC - Hgb today 8.2. Baseline 8-9 - Iron profile stable: tSat 22, ferritin 245.    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 No focal abnormality on this limited unenhanced exam. No evidence of cholelithiasis or cholecystitis. - Denies ETOH use - Hep A,B,C (-) - AST/ALT stable - Ammonia 44, tBili 1.9 - GI saw and signed off: they would not do further serologic evaluation other than those studies pending i.e. hepatitis serologies and ANA. Eventually would consider EGD.   13. Thrombocytopenia  - chronic, last plt 129 - monitor for  bleeding  14. NSVT - 10 beat run NSVT overnight, pt asymptomatic. Back in rate controlled a fib. - K. 3.7, Mg 2.7 - Daily BMET  15. Enlarged thyroid - Thyroid US 06/27/20: 1.5 cm left mid thyroid TR 3 nodule - TSH was WNL .592  Length of Stay: [place], AGACNP-BC  06/28/2022, 7:43 AM  Advanced Heart Failure Team Pager [phone] (M-F; 7a - 5p)  Please contact [facility] for night-coverage after hours (5p -7a ) and weekends on [email or web address]  Patient seen and examined with the above-signed Advanced Practice Provider and/or Housestaff. I personally reviewed laboratory data, imaging studies and relevant notes. I independently examined the patient and formulated the important aspects of the plan. I have edited the note to reflect any of my changes or salient points. I have personally discussed the plan with the patient and/or family.  Urine output improved on lasix gtt. Still volume overloaded. Scr slightly worse. No orthopnea or PND.   General:  Sitting in chair No resp difficulty HEENT: normal Neck: supple. JVP to ear. Carotids 2+ bilat; no bruits. No lymphadenopathy or thryomegaly appreciated. Cor: PMI nondisplaced. Irregular rate & rhythm. 2/6 TR Lungs: clear Abdomen: obese  soft, nontender, nondistended. No hepatosplenomegaly. No bruits or masses. Good bowel sounds. Extremities: no cyanosis, clubbing, rash, 2-3+ edema Neuro: alert & orientedx3, cranial nerves grossly intact. moves all 4 extremities w/o difficulty. Affect pleasant  Will start milrinone for RV support. Continue lasix gtt. Not candidate for long-term HD per Renal.   Glori Bickers, MD  2:26 PM

## 2022-06-28 NOTE — Evaluation (Signed)
Physical Therapy Evaluation Patient Details Name: Rita [name] MRN: [id number] DOB: [birth year] Today's Date: 06/28/2022  History of Present Illness  66 y.o. F admitted on 06/26/22 due to increased O2 needs and swelling. PMH significant for diabetes mellitus diastolic CHF chronic respiratory failure on 1 to 2 L of oxygen anemia, atrial fibrillation, CKD stage III hypertension thrombocytopenia GI bleed.  Clinical Impression  Pt presents with deficits in strength, balance, and activity tolerance. Pt tolerates therapy well today, ambulating household distances (although needs intermittent standing rest breaks) with an AD and 2L O2 (baseline). Pt reports relative weakness and fatigue compared to ambulation at baseline. Continued therapy will assist the pt in building strength and activity tolerance for progression of functional independence and safer mobility.     Recommendations for follow up therapy are one component of a multi-disciplinary discharge planning process, led by the attending physician.  Recommendations may be updated based on patient status, additional functional criteria and insurance authorization.  Follow Up Recommendations No PT follow up      Assistance Recommended at Discharge PRN  Patient can return home with the following  A little help with bathing/dressing/bathroom;Assistance with cooking/housework;Assist for transportation;Help with stairs or ramp for entrance    Equipment Recommendations None recommended by PT  Recommendations for Other Services       Functional Status Assessment Patient has had a recent decline in their functional status and demonstrates the ability to make significant improvements in function in a reasonable and predictable amount of time.     Precautions / Restrictions Precautions Precautions: Fall Restrictions Weight Bearing Restrictions: No      Mobility  Bed Mobility Overal bed mobility: Modified Independent                   Transfers Overall transfer level: Needs assistance Equipment used: Rollator (4 wheels) Transfers: Sit to/from Stand Sit to Stand: Supervision                Ambulation/Gait Ambulation/Gait assistance: Min guard Gait Distance (Feet): 100 Feet Assistive device: Rollator (4 wheels) Gait Pattern/deviations: Step-through pattern, Decreased stride length Gait velocity: Reduced Gait velocity interpretation: <1.31 ft/sec, indicative of household ambulator   General Gait Details: Reduced velocity, but functional gait pattern. PT cues pt to take a break rather than leaning with flexed trunk on Rollator, however pt continues ambulation without break. 2L O2 used during ambulation  Science writer    Modified Rankin (Stroke Patients Only)       Balance Overall balance assessment: Needs assistance Sitting-balance support: No upper extremity supported, Feet supported Sitting balance-Leahy Scale: Good     Standing balance support: Bilateral upper extremity supported, During functional activity, Reliant on assistive device for balance Standing balance-Leahy Scale: Poor                               Pertinent Vitals/Pain Pain Assessment Pain Assessment: Faces Faces Pain Scale: Hurts a little bit Pain Location: Head (HA) Pain Descriptors / Indicators: Aching Pain Intervention(s): Monitored during session    Home Living Family/patient expects to be discharged to:: Private residence Living Arrangements: Spouse/significant other;Children Available Help at Discharge: Family;Available 24 hours/day Type of Home: House Home Access: Stairs to enter Entrance Stairs-Rails: None Entrance Stairs-Number of Steps: 2 steps   Home Layout: One level Home Equipment: Rollator (4 wheels);Cane - single point Additional Comments: Uses  2L O2 baseline    Prior Function Prior Level of Function : Needs assist       Physical Assist : Mobility  (physical) Mobility (physical): Stairs   Mobility Comments: Pt reports sometime doing stairs without assist, but sometimes needing it; Ambulates with Rollator ADLs Comments: Completes all ADL's with no assist, except for donning and doffing shoes and socks. She sponge bathes at baseline.     Hand Dominance   Dominant Hand: Right    Extremity/Trunk Assessment   Upper Extremity Assessment Upper Extremity Assessment: Generalized weakness    Lower Extremity Assessment Lower Extremity Assessment: Generalized weakness    Cervical / Trunk Assessment Cervical / Trunk Assessment: Kyphotic  Communication   Communication: No difficulties  Cognition Arousal/Alertness: Awake/alert Behavior During Therapy: WFL for tasks assessed/performed Overall Cognitive Status: Within Functional Limits for tasks assessed                                          General Comments General comments (skin integrity, edema, etc.): VSS on 2L O2    Exercises     Assessment/Plan    PT Assessment Patient needs continued PT services  PT Problem List Decreased strength;Decreased activity tolerance;Decreased balance;Decreased mobility;Cardiopulmonary status limiting activity       PT Treatment Interventions DME instruction;Gait training;Stair training;Functional mobility training;Therapeutic activities;Therapeutic exercise;Balance training;Patient/family education    PT Goals (Current goals can be found in the Care Plan section)  Acute Rehab PT Goals Patient Stated Goal: Return home PT Goal Formulation: With patient Time For Goal Achievement: 07/12/22 Potential to Achieve Goals: Good    Frequency Min 3X/week     Co-evaluation               AM-PAC PT "6 Clicks" Mobility  Outcome Measure Help needed turning from your back to your side while in a flat bed without using bedrails?: None Help needed moving from lying on your back to sitting on the side of a flat bed without  using bedrails?: None Help needed moving to and from a bed to a chair (including a wheelchair)?: A Little Help needed standing up from a chair using your arms (e.g., wheelchair or bedside chair)?: A Little Help needed to walk in hospital room?: A Little Help needed climbing 3-5 steps with a railing? : A Little 6 Click Score: 20    End of Session Equipment Utilized During Treatment: Gait belt;Oxygen Activity Tolerance: Patient tolerated treatment well Patient left: in chair;with call bell/phone within reach Nurse Communication: Mobility status PT Visit Diagnosis: Other abnormalities of gait and mobility (R26.89);Muscle weakness (generalized) (M62.81);Difficulty in walking, not elsewhere classified (R26.2)    Time: 9417-4081 PT Time Calculation (min) (ACUTE ONLY): 40 min   Charges:   PT Evaluation $PT Eval Low Complexity; 1 low           Hall Busing, SPT Acute Rehabilitation Office #: [phone]   Hall Busing 06/28/2022, 12:55 PM

## 2022-06-28 NOTE — Progress Notes (Signed)
[facility] Progress Note    Assessment/ Plan:    AKI/CKD stage IV - presumably due to cardiorenal syndrome.  Continue with IV diuresis and follow UOP and daily Scr levels.  No urgent indication for dialysis patients at this time. Given advanced RV failure, would not be an ideal long term dialysis candidate as risks>benefits, discussed this briefly with patient and she does agree--ongoing discussion. May benefit from a palliative consultation as well to address Abrams  c/w lasix gtt for now, possible milrinone per Heart failure team.  Will continue to follow.   Renal dose meds for eGFR <15.   Avoid nephrotoxic agents such as IV contrast, NSAIDs/Cox-II I's, phosphate containing bowel preps (FLEETS). 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 Chronic hypoxemic respiratory failure - due to OHS/OSA.  Cpap in hospital however patient refused last night.  Atrial fibrillation -  chronic.  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 HTN - stable H/o DVT/PE - s/p IVC Right pleural effusion - s/p right thoracentesis 7/21 with IR and removed 1.2 Liters Cardiac cirrhosis - GI consulted, signed off. 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 No acute events. Uop ~1L, urine output seems to picked up some after switching to lasix gtt Patient reports that she feels a little better, feels like swelling somewhat improved and breathing is slightly better S/p thora yesterday 1.2L drained Denies n/v/loss of appetite.   Objective:   BP 129/79 (BP Location: Left Arm)   Pulse 75   Temp 97.8 F (36.6 C) (Oral)   Resp 18   Ht 5' 1" (1.549 m)   Wt 92 kg   SpO2 93%   BMI 38.32 kg/m   Intake/Output Summary (Last 24 hours) at 06/28/2022 5427 Last data filed at 06/28/2022 0500 Gross per 24  hour  Intake 408.62 ml  Output 2275 ml  Net -1866.38 ml   Weight change: 6.724 kg  Physical Exam: Gen:ill appearing, NAD, sitting up in bed CVS:RRR Resp:diminished air entry bibasilar CWC:BJSEG, soft Ext:2+ pitting edema Neuro: awake, alert  Imaging: IR THORACENTESIS ASP PLEURAL SPACE W/IMG GUIDE  Result Date: 06/28/2022 INDICATION: Patient with history of congestive heart failure presents with shortness of breath, previous imaging showed right pleural effusion. Request for therapeutic and diagnostic thoracentesis. EXAM: ULTRASOUND GUIDED RIGHT THORACENTESIS MEDICATIONS: 10 mL 1% lidocaine COMPLICATIONS: None immediate. PROCEDURE: An ultrasound guided thoracentesis was thoroughly discussed with the patient and questions answered. The benefits, risks, alternatives and complications were also discussed. The patient understands and wishes to proceed with the procedure. Written consent was obtained. Ultrasound was performed to localize and mark an adequate pocket of fluid in the right chest. The area was then prepped and draped in the normal sterile fashion. 1% Lidocaine was used for local anesthesia. Under ultrasound guidance a 6 Fr Safe-T-Centesis catheter was introduced. Thoracentesis was performed. The catheter was removed and a dressing applied. FINDINGS: A total of approximately 1.2 L of hazy amber fluid was removed. Samples were sent to the laboratory as requested by the clinical team. Post procedure chest X-ray reviewed, negative for pneumothorax. IMPRESSION: Successful ultrasound guided right thoracentesis yielding 1.2 L of pleural fluid. Read by: Durenda Guthrie, PA-C Electronically Signed   By: Aletta Edouard M.D.   On: 06/28/2022 08:08   ECHOCARDIOGRAM COMPLETE  Result Date: 06/28/2022  ECHOCARDIOGRAM REPORT   Patient Name:   ZULEIMA HASER MARBLE Lomas Date of Exam: 06/27/2022 Medical Rec #:  [id number]                  Height:       61.0 in Accession #:    [id number]                 Weight:        205.0 lb Date of Birth:  05/08/1956                   BSA:          1.909 m Patient Age:    66 years                   BP:           117/56 mmHg Patient Gender: F                          HR:           70 bpm. Exam Location:  Inpatient Procedure: 2D Echo Indications:    acute diastolic chf  History:        Patient has prior history of Echocardiogram examinations, most                 recent 12/28/2021. Chronic kidney disease. Cirrhosis,                 Arrythmias:Atrial Fibrillation, Signs/Symptoms:Edema; Risk                 Factors:Diabetes, Hypertension and Sleep Apnea.  Sonographer:    Johny Chess RDCS Referring Phys: [place]  1. Left ventricular ejection fraction, by estimation, is 60 to 65%. The left ventricle has normal function. The left ventricle has no regional wall motion abnormalities. There is mild left ventricular hypertrophy of the lateral segment. Left ventricular  diastolic parameters are indeterminate. There is the interventricular septum is flattened in systole and diastole, consistent with right ventricular pressure and volume overload.  2. Right ventricular systolic function is normal. The right ventricular size is mildly enlarged. There is severely elevated pulmonary artery systolic pressure.  3. Left atrial size was severely dilated.  4. There is a hyperechoic density on the posterior mitral valve annulus measuring 0.8 x 0.7 cm. This was present on the echo 08/2020, though it has increased in size. Likely represents mital annular calcification. The mitral valve is normal in structure.  Trivial mitral valve regurgitation. No evidence of mitral stenosis.  5. Tricuspid valve regurgitation is severe.  6. The aortic valve is tricuspid. Aortic valve regurgitation is not visualized. No aortic stenosis is present.  7. The inferior vena cava is dilated in size with <50% respiratory variability, suggesting right atrial pressure of 15 mmHg. FINDINGS  Left Ventricle:  Left ventricular ejection fraction, by estimation, is 60 to 65%. The left ventricle has normal function. The left ventricle has no regional wall motion abnormalities. The left ventricular internal cavity size was normal in size. There is  mild left ventricular hypertrophy of the lateral segment. The interventricular septum is flattened in systole and diastole, consistent with right ventricular pressure and volume overload. Left ventricular diastolic function could not be evaluated due to  atrial fibrillation. Left ventricular diastolic parameters are indeterminate. Right Ventricle: The right ventricular size is mildly enlarged. No increase in right ventricular wall thickness. Right ventricular systolic  function is normal. There is severely elevated pulmonary artery systolic pressure. The tricuspid regurgitant velocity is 3.44 m/s, and with an assumed right atrial pressure of 15 mmHg, the estimated right ventricular systolic pressure is 16.1 mmHg. Left Atrium: Left atrial size was severely dilated. Right Atrium: Right atrial size was normal in size. Pericardium: There is no evidence of pericardial effusion. Mitral Valve: There is a hyperechoic density on the posterior mitral valve annulus measuring 0.8 x 0.7 cm. This was present on the echo 08/2020, though it has increased in size. Likely represents mital annular calcification. The mitral valve is normal in structure. Mild to moderate mitral annular calcification. Trivial mitral valve regurgitation. No evidence of mitral valve stenosis. Tricuspid Valve: The tricuspid valve is normal in structure. Tricuspid valve regurgitation is severe. No evidence of tricuspid stenosis. Aortic Valve: The aortic valve is tricuspid. Aortic valve regurgitation is not visualized. No aortic stenosis is present. Pulmonic Valve: The pulmonic valve was normal in structure. Pulmonic valve regurgitation is not visualized. No evidence of pulmonic stenosis. Aorta: The aortic root is normal in  size and structure. Venous: The inferior vena cava is dilated in size with less than 50% respiratory variability, suggesting right atrial pressure of 15 mmHg. IAS/Shunts: No atrial level shunt detected by color flow Doppler.  LEFT VENTRICLE PLAX 2D LVIDd:         4.10 cm LVIDs:         2.60 cm LV PW:         1.20 cm LV IVS:        0.90 cm LVOT diam:     1.70 cm LV SV:         46 LV SV Index:   24 LVOT Area:     2.27 cm  RIGHT VENTRICLE            IVC RV Basal diam:  3.40 cm    IVC diam: 3.10 cm RV S prime:     9.36 cm/s TAPSE (M-mode): 1.1 cm LEFT ATRIUM              Index        RIGHT ATRIUM           Index LA diam:        4.10 cm  2.15 cm/m   RA Area:     24.70 cm LA Vol (A2C):   116.0 ml 60.77 ml/m  RA Volume:   74.20 ml  38.87 ml/m LA Vol (A4C):   62.1 ml  32.53 ml/m LA Biplane Vol: 86.7 ml  45.42 ml/m  AORTIC VALVE LVOT Vmax:   111.00 cm/s LVOT Vmean:  71.700 cm/s LVOT VTI:    0.202 m  AORTA Ao Root diam: 2.50 cm Ao Asc diam:  3.00 cm TRICUSPID VALVE TR Peak grad:   47.3 mmHg TR Vmax:        344.00 cm/s  SHUNTS Systemic VTI:  0.20 m Systemic Diam: 1.70 cm Skeet Latch MD Electronically signed by Skeet Latch MD Signature Date/Time: 06/28/2022/6:20:54 AM    Final    VAS [place] LOWER EXTREMITY VENOUS (DVT)  Result Date: 06/27/2022  Lower Venous DVT Study Patient Name:  TESHARA MOREE MARBLE Berrett  Date of Exam:   06/27/2022 Medical Rec #: [id number]                   Accession #:    [id number] Date of Birth: 1956/03/16  Patient Gender: F Patient Age:   53 years Exam Location:  [facility] Procedure:      VAS [place] LOWER EXTREMITY VENOUS (DVT) Referring Phys: Nyoka Lint DOUTOVA --------------------------------------------------------------------------------  Indications: Edema.  Risk Factors: Obesity and Hx of DVT s/p IVC filter, CHF, CKD, Afib, pulmonary HTN, cirrhosis. Limitations: Body habitus and poor ultrasound/tissue interface. Comparison Study: No previous exams available for  review Performing Technologist: Jody Hill RVT, RDMS  Examination Guidelines: A complete evaluation includes B-mode imaging, spectral Doppler, color Doppler, and power Doppler as needed of all accessible portions of each vessel. Bilateral testing is considered an integral part of a complete examination. Limited examinations for reoccurring indications may be performed as noted. The reflux portion of the exam is performed with the patient in reverse Trendelenburg.  +---------+---------------+---------+-----------+----------+-------------------+ RIGHT    CompressibilityPhasicitySpontaneityPropertiesThrombus Aging      +---------+---------------+---------+-----------+----------+-------------------+ CFV      Full           No       Yes                                      +---------+---------------+---------+-----------+----------+-------------------+ SFJ      Full                                                             +---------+---------------+---------+-----------+----------+-------------------+ FV Prox  Full           No       Yes                                      +---------+---------------+---------+-----------+----------+-------------------+ FV Mid   Full           No       Yes                                      +---------+---------------+---------+-----------+----------+-------------------+ FV DistalFull           No       Yes                                      +---------+---------------+---------+-----------+----------+-------------------+ PFV      Full                                                             +---------+---------------+---------+-----------+----------+-------------------+ POP      Full           No       Yes                                      +---------+---------------+---------+-----------+----------+-------------------+ PTV      Full  Not well visualized  +---------+---------------+---------+-----------+----------+-------------------+ PERO     Full                                         Not well visualized +---------+---------------+---------+-----------+----------+-------------------+ Pulsatile flow throughout RLE. Several varicosties seen in area of groin, patent by color and compression.  +---------+---------------+---------+-----------+----------+-------------------+ LEFT     CompressibilityPhasicitySpontaneityPropertiesThrombus Aging      +---------+---------------+---------+-----------+----------+-------------------+ CFV      Full           No       Yes                                      +---------+---------------+---------+-----------+----------+-------------------+ SFJ      Full                                                             +---------+---------------+---------+-----------+----------+-------------------+ FV Prox  Full           No       Yes                                      +---------+---------------+---------+-----------+----------+-------------------+ FV Mid   Full           No       Yes                                      +---------+---------------+---------+-----------+----------+-------------------+ FV DistalFull           No       Yes                                      +---------+---------------+---------+-----------+----------+-------------------+ PFV      Full                                                             +---------+---------------+---------+-----------+----------+-------------------+ POP      Full           No       Yes                                      +---------+---------------+---------+-----------+----------+-------------------+ PTV      Full                                         Not well visualized +---------+---------------+---------+-----------+----------+-------------------+ PERO     Full  Not well visualized +---------+---------------+---------+-----------+----------+-------------------+ Pulsatile flow throughout LLE.    Summary: BILATERAL: - No evidence of deep vein thrombosis seen in the lower extremities, bilaterally. -No evidence of popliteal cyst, bilaterally.   *See table(s) above for measurements and observations. Electronically signed by Jamelle Haring on 06/27/2022 at 8:22:06 PM.    Final    US RENAL  Result Date: 06/27/2022 CLINICAL DATA:  Acute renal insufficiency EXAM: RENAL / URINARY TRACT ULTRASOUND COMPLETE COMPARISON:  06/26/2022 FINDINGS: Right Kidney: Renal measurements: 9.0 x 4.8 x 4.5 cm = volume: 100 mL. Echogenicity within normal limits. No mass or hydronephrosis visualized. Left Kidney: Renal measurements: 9.1 x 4.4 x 5.1 cm = volume: 106 mL. Echogenicity within normal limits. No mass or hydronephrosis visualized. Bladder: Appears normal for degree of bladder distention. Other: None. IMPRESSION: 1. Unremarkable renal ultrasound. Electronically Signed   By: Randa Ngo M.D.   On: 06/27/2022 16:27   DG Chest 1 View  Result Date: 06/27/2022 CLINICAL DATA:  A 66 year old female presents following thoracentesis on the RIGHT post removal of 1.2 L by report. EXAM: CHEST  1 VIEW COMPARISON:  06/26/2022. FINDINGS: EKG leads project over the chest. Trachea midline. Cardiomediastinal contours with cardiac enlargement and engorgement of central pulmonary vasculature. Bibasilar airspace disease with improved aeration at the RIGHT lung base following near complete resolution of pleural fluid. No pneumothorax. On limited assessment there is no acute skeletal finding. IMPRESSION: Improved aeration at the RIGHT lung base following near complete resolution of right effusion. No pneumothorax. Persistent bibasilar airspace disease and marked cardiomegaly. Electronically Signed   By: Zetta Bills M.D.   On: 06/27/2022 14:57   CT ABDOMEN PELVIS WO CONTRAST  Result Date:  06/26/2022 CLINICAL DATA:  Abdominal pain, lower extremity swelling EXAM: CT ABDOMEN AND PELVIS WITHOUT CONTRAST TECHNIQUE: Multidetector CT imaging of the abdomen and pelvis was performed following the standard protocol without IV contrast. Unenhanced CT was performed per clinician order. Lack of IV contrast limits sensitivity and specificity, especially for evaluation of abdominal/pelvic solid viscera. RADIATION DOSE REDUCTION: This exam was performed according to the departmental dose-optimization program which includes automated exposure control, adjustment of the mA and/or kV according to patient size and/or use of iterative reconstruction technique. COMPARISON:  05/09/2021 FINDINGS: Lower chest: Dense right lower lobe consolidation consistent with atelectasis. The large right pleural effusion increased since prior exam. Continued cardiomegaly without pericardial effusion. Hepatobiliary: Nodular contour of the liver capsule consistent with cirrhosis. No focal abnormality on this limited unenhanced exam. No evidence of cholelithiasis or cholecystitis. Pancreas: Stable 13 mm hypodensity ventral margin pancreatic body. Otherwise unremarkable unenhanced appearance. Spleen: Unremarkable unenhanced appearance. Adrenals/Urinary Tract: No urinary tract calculi or obstructive uropathy. The adrenals and bladder are unremarkable. Stomach/Bowel: No bowel obstruction or ileus. Normal appendix. No bowel wall thickening or inflammatory change. Vascular/Lymphatic: There is marked distension of the IVC above the level of an IVC filter, as well as distension of the hepatic veins. This is slightly more pronounced since prior study, likely reflects venous reflux some cardiac dysfunction. Stable position of the IVC filter below the right renal vein. Evaluation of the vascular lumen is limited without IV contrast. Stable atherosclerosis of the aorta and its branches. No pathologic adenopathy. Reproductive: Uterus and bilateral  adnexa are unremarkable. Other: Subcutaneous edema throughout the lower anterior abdominal wall and dermal thickening, consistent with third spacing of fluid. Trace free fluid within the right upper quadrant. No free intraperitoneal gas. No abdominal wall hernia. Musculoskeletal: No acute or destructive bony lesions. Reconstructed  images demonstrate no additional findings. IMPRESSION: 1. Cardiomegaly, with marked distension of the hepatic veins and suprarenal IVC compatible with cardiac dysfunction and venous reflux. 2. Large right pleural effusion with compressive right lower lobe atelectasis. 3. Subcutaneous edema and dermal wall thickening as above consistent with third spacing of fluid or venous congestion. 4. Nodular contour of the liver compatible with cirrhosis. 5. Trace ascites. 6. Stable indeterminate 13 mm hypodensity within the pancreatic body. If not previously evaluated, definitive characterization with nonemergent outpatient MRI with and without contrast may be useful. 7.  Aortic Atherosclerosis (ICD10-I70.0). Electronically Signed   By: Randa Ngo M.D.   On: 06/26/2022 17:39   DG Chest 2 View  Result Date: 06/26/2022 CLINICAL DATA:  Shortness of breath. EXAM: CHEST - 2 VIEW COMPARISON:  October 28, 2021. FINDINGS: Stable cardiomegaly. Increased right basilar opacity is noted most consistent with subsegmental atelectasis or edema with associated pleural effusion. Bony thorax is unremarkable. IMPRESSION: Increased right basilar opacity is noted most consistent with subsegmental atelectasis or asymmetric edema with associated pleural effusion. Electronically Signed   By: Marijo Conception M.D.   On: 06/26/2022 15:31    Labs: BMET Recent Labs  Lab 06/26/22 1212 06/27/22 0254 06/27/22 0805 06/27/22 1119 06/28/22 0340  NA 135  --  140  --  139  K 3.7  --  3.2*  --  3.7  CL 91*  --  91*  --  92*  CO2 34*  --  36*  --  35*  GLUCOSE 151*  --  76  --  125*  BUN 109*  --  115*  --  103*   CREATININE 3.68*  --  3.56*  --  3.66*  CALCIUM 9.5  --  9.1  --  9.1  PHOS  --  5.1*  --  4.8*  --    CBC Recent Labs  Lab 06/26/22 1443 06/27/22 0254 06/27/22 0805 06/28/22 0340  WBC 4.2 3.7* 3.8* 4.5  NEUTROABS  --  2.5  --   --   HGB 8.6* 8.0* 8.3* 8.2*  HCT 25.0* 23.1* 24.0* 24.0*  MCV 82.5 82.8 82.8 82.5  PLT 119* 115* 120* 129*    Medications:     aspirin EC  81 mg Oral Daily   enoxaparin (LOVENOX) injection  30 mg Subcutaneous Q24H   insulin aspart  0-5 Units Subcutaneous QHS   insulin aspart  0-9 Units Subcutaneous TID WC   lactose free nutrition  237 mL Oral TID WC   metolazone  5 mg Oral BID   pantoprazole  40 mg Oral Q1200   sodium chloride flush  3 mL Intravenous Q12H      Gean Quint, MD [facility] 06/28/2022, 9:33 AM \

## 2022-06-28 NOTE — Progress Notes (Addendum)
PROGRESS NOTE    Carline Dura  AYT:[id number] DOB: 28-Aug-1956 DOA: 06/26/2022 PCP: [name], MD  66/F with history of chronic systolic CHF, EF recovered, CKD 4, history of chronic GI bleed/SB AVMs, paroxysmal A-fib not on anticoagulation, type 2 diabetes mellitus, history of DVT/PE, IVC filter, chronic respiratory failure on 2 L home O2, obesity presented to the ED with worsening edema, abdominal distention.  Seen in clinic a week ago, torsemide dose increased -CT abdomen pelvis noted cardiomegaly, marked distention of hepatic veins and suprarenal IVC, large right pleural effusion, abdominal wall edema, liver cirrhosis, trace ascites and hypodensity in pancreatic body  -7/20 started Lasix gtt, R Thora -1.2L drained -7/21 starting milrinone, advanced heart failure team and nephrology following  Subjective: -Breathing continues to improve, urinated more than what was recorded  Assessment and Plan:  Acute on chronic systolic CHF Large right pleural effusion -BiV failure, predominantly right heart failure -EF recovered to 55%, on echo 1/23 -Now profoundly volume overloaded with large right effusion, anasarca and 3+ edema -Repeat echo with preserved EF, severe PAH and severe TR -Advanced heart failure team following, now on Lasix gtt., starting milrinone today -Anticipate need for right heart cath -Hold Coreg and hydralazine -GDMT limited by AKI/CKD -s/p thoracentesis in IR yesterday, 1.2 L drained -GDMT limited by CKD 4/AKI -TED hose ordered yesterday, pending  AKI on CKD 4 -Baseline creatinine around 2.6, now 3.6, BUN> 100 -Suspect this is cardiorenal, diuretics as above, avoid hypotension -Advanced heart failure team consulted, followed by Dr.Foster -Starting milrinone today, appreciate nephrology input -Diuretics as above  Chronic anemia -Secondary to CKD 4 and chronic GI bleed, stable, -Anemia panel more suggestive of chronic disease  Cirrhosis -Secondary  to right heart failure, no history of chronic hepatitis or alcoholism -Diuretics as above, avoid Aldactone with CKD 4  Chronic hypoxic respiratory failure -Reportedly CPAP was taken away from her home recently, -Continue CPAP nightly, TOC consult for home CPAP  Chronic A-fib -Rate controlled, Coreg on hold at this time, not on anticoagulation due to recurrent GI bleeds from small bowel AVMs  History of recurrent GI bleeds -History of small bowel AVMs, hemoglobin stable at this time, monitor  History of DVT/PE -Following orthopedic surgery, has IVC filter  Type 2 diabetes mellitus -CBGs are stable, hemoglobin A1c was 7.0  DVT prophylaxis:  Lovenox Code Status: Full code Family Communication: Discussed with patient detail, no family at bedside Disposition Plan: Anticipate long hospitalization  Consultants:    Procedures:   Antimicrobials:    Objective: Vitals:   06/27/22 1538 06/27/22 1926 06/28/22 0500 06/28/22 1000  BP: (!) 117/56 (!) 116/56 129/79 104/68  Pulse: 74 73 75 73  Resp: 18 18 18 16   Temp: 98 F (36.7 C) 97.9 F (36.6 C) 97.8 F (36.6 C)   TempSrc: Oral Oral Oral   SpO2: 94% 91% 93%   Weight:   92 kg   Height:        Intake/Output Summary (Last 24 hours) at 06/28/2022 1208 Last data filed at 06/28/2022 0500 Gross per 24 hour  Intake 308.62 ml  Output 2000 ml  Net -1691.38 ml   Filed Weights   06/26/22 1618 06/27/22 0200 06/28/22 0500  Weight: 85.3 kg 93 kg 92 kg    Examination:  General exam: Chronically ill female sitting up in bed, AAO x3 HEENT: Positive JVD CVS: S1-S2, regular rhythm Lungs: Decreased breath sounds at the bases Abdomen: Soft, obese, nontender, abdominal wall edema improving Extremities: 2+ edema  Skin: No rashes Psychiatry:  Mood & affect appropriate.     Data Reviewed:   CBC: Recent Labs  Lab 06/26/22 1443 06/27/22 0254 06/27/22 0805 06/28/22 0340  WBC 4.2 3.7* 3.8* 4.5  NEUTROABS  --  2.5  --   --   HGB  8.6* 8.0* 8.3* 8.2*  HCT 25.0* 23.1* 24.0* 24.0*  MCV 82.5 82.8 82.8 82.5  PLT 119* 115* 120* 828*   Basic Metabolic Panel: Recent Labs  Lab 06/26/22 1212 06/27/22 0254 06/27/22 0805 06/27/22 1119 06/28/22 0340  NA 135  --  140  --  139  K 3.7  --  3.2*  --  3.7  CL 91*  --  91*  --  92*  CO2 34*  --  36*  --  35*  GLUCOSE 151*  --  76  --  125*  BUN 109*  --  115*  --  103*  CREATININE 3.68*  --  3.56*  --  3.66*  CALCIUM 9.5  --  9.1  --  9.1  MG  --  2.8*  --  3.0* 2.7*  PHOS  --  5.1*  --  4.8*  --    GFR: Estimated Creatinine Clearance: 15.2 mL/min (A) (by C-G formula based on SCr of 3.66 mg/dL (H)). Liver Function Tests: Recent Labs  Lab 06/27/22 0254 06/28/22 0340  AST 21 20  ALT 16 13  ALKPHOS 81 79  BILITOT 1.9* 1.9*  PROT 7.3 6.9  ALBUMIN 3.2* 3.1*   No results for input(s): "LIPASE", "AMYLASE" in the last 168 hours. Recent Labs  Lab 06/27/22 0254  AMMONIA 44*   Coagulation Profile: Recent Labs  Lab 06/27/22 0254  INR 1.4*   Cardiac Enzymes: Recent Labs  Lab 06/27/22 0254  CKTOTAL 74   BNP (last 3 results) No results for input(s): "PROBNP" in the last 8760 hours. HbA1C: Recent Labs    06/27/22 0254  HGBA1C 7.0*   CBG: Recent Labs  Lab 06/27/22 1058 06/27/22 1536 06/27/22 2119 06/28/22 0600 06/28/22 1138  GLUCAP 90 125* 169* 116* 244*   Lipid Profile: No results for input(s): "CHOL", "HDL", "LDLCALC", "TRIG", "CHOLHDL", "LDLDIRECT" in the last 72 hours. Thyroid Function Tests: Recent Labs    06/27/22 0254  TSH 0.592   Anemia Panel: Recent Labs    06/27/22 0254  VITAMINB12 829  FOLATE 39.3  FERRITIN 245  TIBC 225*  IRON 50  RETICCTPCT 0.9   Urine analysis:    Component Value Date/Time   COLORURINE STRAW (A) 06/26/2022 2036   APPEARANCEUR CLEAR 06/26/2022 2036   LABSPEC 1.006 06/26/2022 2036   PHURINE 5.0 06/26/2022 2036   GLUCOSEU NEGATIVE 06/26/2022 2036   HGBUR NEGATIVE 06/26/2022 2036   BILIRUBINUR  NEGATIVE 06/26/2022 2036   KETONESUR NEGATIVE 06/26/2022 2036   PROTEINUR NEGATIVE 06/26/2022 2036   NITRITE NEGATIVE 06/26/2022 2036   LEUKOCYTESUR NEGATIVE 06/26/2022 2036   Sepsis Labs: @LABRCNTIP (procalcitonin:4,lacticidven:4)  ) Recent Results (from the past 240 hour(s))  Culture, body fluid w Gram Stain-bottle     Status: None (Preliminary result)   Collection Time: 06/27/22  3:02 PM   Specimen: Pleura  Result Value Ref Range Status   Specimen Description PLEURAL  Final   Special Requests RIGHT LUNG  Final   Culture   Final    NO GROWTH < 24 HOURS Performed at [facility], [place] [address], [place], [place] [address]    Report Status PENDING  Incomplete  Gram stain  Status: None   Collection Time: 06/27/22  3:02 PM   Specimen: Fluid  Result Value Ref Range Status   Specimen Description FLUID  Final   Special Requests RIGHT LUNG  Final   Gram Stain   Final    WBC PRESENT, PREDOMINANTLY MONONUCLEAR NO ORGANISMS SEEN CYTOSPIN SMEAR Performed at [facility], [place] [address] [place],  [address]    Report Status 06/27/2022 FINAL  Final     Radiology Studies: IR THORACENTESIS ASP PLEURAL SPACE W/IMG GUIDE  Result Date: 06/28/2022 INDICATION: Patient with history of congestive heart failure presents with shortness of breath, previous imaging showed right pleural effusion. Request for therapeutic and diagnostic thoracentesis. EXAM: ULTRASOUND GUIDED RIGHT THORACENTESIS MEDICATIONS: 10 mL 1% lidocaine COMPLICATIONS: None immediate. PROCEDURE: An ultrasound guided thoracentesis was thoroughly discussed with the patient and questions answered. The benefits, risks, alternatives and complications were also discussed. The patient understands and wishes to proceed with the procedure. Written consent was obtained. Ultrasound was performed to localize and mark an adequate pocket of fluid in the right chest. The area was then prepped and draped in the normal sterile  fashion. 1% Lidocaine was used for local anesthesia. Under ultrasound guidance a 6 Fr Safe-T-Centesis catheter was introduced. Thoracentesis was performed. The catheter was removed and a dressing applied. FINDINGS: A total of approximately 1.2 L of hazy amber fluid was removed. Samples were sent to the laboratory as requested by the clinical team. Post procedure chest X-ray reviewed, negative for pneumothorax. IMPRESSION: Successful ultrasound guided right thoracentesis yielding 1.2 L of pleural fluid. Read by: Durenda Guthrie, PA-C Electronically Signed   By: Aletta Edouard M.D.   On: 06/28/2022 08:08   ECHOCARDIOGRAM COMPLETE  Result Date: 06/28/2022    ECHOCARDIOGRAM REPORT   Patient Name:   ASHAUNTI TREPTOW MARBLE Micek Date of Exam: 06/27/2022 Medical Rec #:  [id number]                  Height:       61.0 in Accession #:    [id number]                 Weight:       205.0 lb Date of Birth:  1956-09-07                   BSA:          1.909 m Patient Age:    35 years                   BP:           117/56 mmHg Patient Gender: F                          HR:           70 bpm. Exam Location:  Inpatient Procedure: 2D Echo Indications:    acute diastolic chf  History:        Patient has prior history of Echocardiogram examinations, most                 recent 12/28/2021. Chronic kidney disease. Cirrhosis,                 Arrythmias:Atrial Fibrillation, Signs/Symptoms:Edema; Risk                 Factors:Diabetes, Hypertension and Sleep Apnea.  Sonographer:    Johny Chess RDCS Referring Phys: [place]  1. Left ventricular ejection fraction, by estimation, is 60 to 65%. The left ventricle has normal function. The left ventricle has no regional wall motion abnormalities. There is mild left ventricular hypertrophy of the lateral segment. Left ventricular  diastolic parameters are indeterminate. There is the interventricular septum is flattened in systole and diastole, consistent with right  ventricular pressure and volume overload.  2. Right ventricular systolic function is normal. The right ventricular size is mildly enlarged. There is severely elevated pulmonary artery systolic pressure.  3. Left atrial size was severely dilated.  4. There is a hyperechoic density on the posterior mitral valve annulus measuring 0.8 x 0.7 cm. This was present on the echo 08/2020, though it has increased in size. Likely represents mital annular calcification. The mitral valve is normal in structure.  Trivial mitral valve regurgitation. No evidence of mitral stenosis.  5. Tricuspid valve regurgitation is severe.  6. The aortic valve is tricuspid. Aortic valve regurgitation is not visualized. No aortic stenosis is present.  7. The inferior vena cava is dilated in size with <50% respiratory variability, suggesting right atrial pressure of 15 mmHg. FINDINGS  Left Ventricle: Left ventricular ejection fraction, by estimation, is 60 to 65%. The left ventricle has normal function. The left ventricle has no regional wall motion abnormalities. The left ventricular internal cavity size was normal in size. There is  mild left ventricular hypertrophy of the lateral segment. The interventricular septum is flattened in systole and diastole, consistent with right ventricular pressure and volume overload. Left ventricular diastolic function could not be evaluated due to  atrial fibrillation. Left ventricular diastolic parameters are indeterminate. Right Ventricle: The right ventricular size is mildly enlarged. No increase in right ventricular wall thickness. Right ventricular systolic function is normal. There is severely elevated pulmonary artery systolic pressure. The tricuspid regurgitant velocity is 3.44 m/s, and with an assumed right atrial pressure of 15 mmHg, the estimated right ventricular systolic pressure is 53.2 mmHg. Left Atrium: Left atrial size was severely dilated. Right Atrium: Right atrial size was normal in size.  Pericardium: There is no evidence of pericardial effusion. Mitral Valve: There is a hyperechoic density on the posterior mitral valve annulus measuring 0.8 x 0.7 cm. This was present on the echo 08/2020, though it has increased in size. Likely represents mital annular calcification. The mitral valve is normal in structure. Mild to moderate mitral annular calcification. Trivial mitral valve regurgitation. No evidence of mitral valve stenosis. Tricuspid Valve: The tricuspid valve is normal in structure. Tricuspid valve regurgitation is severe. No evidence of tricuspid stenosis. Aortic Valve: The aortic valve is tricuspid. Aortic valve regurgitation is not visualized. No aortic stenosis is present. Pulmonic Valve: The pulmonic valve was normal in structure. Pulmonic valve regurgitation is not visualized. No evidence of pulmonic stenosis. Aorta: The aortic root is normal in size and structure. Venous: The inferior vena cava is dilated in size with less than 50% respiratory variability, suggesting right atrial pressure of 15 mmHg. IAS/Shunts: No atrial level shunt detected by color flow Doppler.  LEFT VENTRICLE PLAX 2D LVIDd:         4.10 cm LVIDs:         2.60 cm LV PW:         1.20 cm LV IVS:        0.90 cm LVOT diam:     1.70 cm LV SV:         46 LV SV Index:   24 LVOT Area:  2.27 cm  RIGHT VENTRICLE            IVC RV Basal diam:  3.40 cm    IVC diam: 3.10 cm RV S prime:     9.36 cm/s TAPSE (M-mode): 1.1 cm LEFT ATRIUM              Index        RIGHT ATRIUM           Index LA diam:        4.10 cm  2.15 cm/m   RA Area:     24.70 cm LA Vol (A2C):   116.0 ml 60.77 ml/m  RA Volume:   74.20 ml  38.87 ml/m LA Vol (A4C):   62.1 ml  32.53 ml/m LA Biplane Vol: 86.7 ml  45.42 ml/m  AORTIC VALVE LVOT Vmax:   111.00 cm/s LVOT Vmean:  71.700 cm/s LVOT VTI:    0.202 m  AORTA Ao Root diam: 2.50 cm Ao Asc diam:  3.00 cm TRICUSPID VALVE TR Peak grad:   47.3 mmHg TR Vmax:        344.00 cm/s  SHUNTS Systemic VTI:  0.20 m  Systemic Diam: 1.70 cm Skeet Latch MD Electronically signed by Skeet Latch MD Signature Date/Time: 06/28/2022/6:20:54 AM    Final    VAS [place] LOWER EXTREMITY VENOUS (DVT)  Result Date: 06/27/2022  Lower Venous DVT Study Patient Name:  LILLYIAN HEIDT MARBLE Newson  Date of Exam:   06/27/2022 Medical Rec #: [id number]                   Accession #:    [id number] Date of Birth: 1956-04-18                    Patient Gender: F Patient Age:   16 years Exam Location:  [facility] Procedure:      VAS [place] LOWER EXTREMITY VENOUS (DVT) Referring Phys: Nyoka Lint DOUTOVA --------------------------------------------------------------------------------  Indications: Edema.  Risk Factors: Obesity and Hx of DVT s/p IVC filter, CHF, CKD, Afib, pulmonary HTN, cirrhosis. Limitations: Body habitus and poor ultrasound/tissue interface. Comparison Study: No previous exams available for review Performing Technologist: Jody Hill RVT, RDMS  Examination Guidelines: A complete evaluation includes B-mode imaging, spectral Doppler, color Doppler, and power Doppler as needed of all accessible portions of each vessel. Bilateral testing is considered an integral part of a complete examination. Limited examinations for reoccurring indications may be performed as noted. The reflux portion of the exam is performed with the patient in reverse Trendelenburg.  +---------+---------------+---------+-----------+----------+-------------------+ RIGHT    CompressibilityPhasicitySpontaneityPropertiesThrombus Aging      +---------+---------------+---------+-----------+----------+-------------------+ CFV      Full           No       Yes                                      +---------+---------------+---------+-----------+----------+-------------------+ SFJ      Full                                                             +---------+---------------+---------+-----------+----------+-------------------+ FV Prox  Full            No  Yes                                      +---------+---------------+---------+-----------+----------+-------------------+ FV Mid   Full           No       Yes                                      +---------+---------------+---------+-----------+----------+-------------------+ FV DistalFull           No       Yes                                      +---------+---------------+---------+-----------+----------+-------------------+ PFV      Full                                                             +---------+---------------+---------+-----------+----------+-------------------+ POP      Full           No       Yes                                      +---------+---------------+---------+-----------+----------+-------------------+ PTV      Full                                         Not well visualized +---------+---------------+---------+-----------+----------+-------------------+ PERO     Full                                         Not well visualized +---------+---------------+---------+-----------+----------+-------------------+ Pulsatile flow throughout RLE. Several varicosties seen in area of groin, patent by color and compression.  +---------+---------------+---------+-----------+----------+-------------------+ LEFT     CompressibilityPhasicitySpontaneityPropertiesThrombus Aging      +---------+---------------+---------+-----------+----------+-------------------+ CFV      Full           No       Yes                                      +---------+---------------+---------+-----------+----------+-------------------+ SFJ      Full                                                             +---------+---------------+---------+-----------+----------+-------------------+ FV Prox  Full           No       Yes                                      +---------+---------------+---------+-----------+----------+-------------------+  FV  Mid   Full           No       Yes                                      +---------+---------------+---------+-----------+----------+-------------------+ FV DistalFull           No       Yes                                      +---------+---------------+---------+-----------+----------+-------------------+ PFV      Full                                                             +---------+---------------+---------+-----------+----------+-------------------+ POP      Full           No       Yes                                      +---------+---------------+---------+-----------+----------+-------------------+ PTV      Full                                         Not well visualized +---------+---------------+---------+-----------+----------+-------------------+ PERO     Full                                         Not well visualized +---------+---------------+---------+-----------+----------+-------------------+ Pulsatile flow throughout LLE.    Summary: BILATERAL: - No evidence of deep vein thrombosis seen in the lower extremities, bilaterally. -No evidence of popliteal cyst, bilaterally.   *See table(s) above for measurements and observations. Electronically signed by Jamelle Haring on 06/27/2022 at 8:22:06 PM.    Final    US RENAL  Result Date: 06/27/2022 CLINICAL DATA:  Acute renal insufficiency EXAM: RENAL / URINARY TRACT ULTRASOUND COMPLETE COMPARISON:  06/26/2022 FINDINGS: Right Kidney: Renal measurements: 9.0 x 4.8 x 4.5 cm = volume: 100 mL. Echogenicity within normal limits. No mass or hydronephrosis visualized. Left Kidney: Renal measurements: 9.1 x 4.4 x 5.1 cm = volume: 106 mL. Echogenicity within normal limits. No mass or hydronephrosis visualized. Bladder: Appears normal for degree of bladder distention. Other: None. IMPRESSION: 1. Unremarkable renal ultrasound. Electronically Signed   By: Randa Ngo M.D.   On: 06/27/2022 16:27   DG Chest 1  View  Result Date: 06/27/2022 CLINICAL DATA:  A 66 year old female presents following thoracentesis on the RIGHT post removal of 1.2 L by report. EXAM: CHEST  1 VIEW COMPARISON:  06/26/2022. FINDINGS: EKG leads project over the chest. Trachea midline. Cardiomediastinal contours with cardiac enlargement and engorgement of central pulmonary vasculature. Bibasilar airspace disease with improved aeration at the RIGHT lung base following near complete resolution of pleural fluid. No pneumothorax. On limited assessment there is no acute skeletal finding. IMPRESSION: Improved aeration at the RIGHT lung base following  near complete resolution of right effusion. No pneumothorax. Persistent bibasilar airspace disease and marked cardiomegaly. Electronically Signed   By: Zetta Bills M.D.   On: 06/27/2022 14:57   CT ABDOMEN PELVIS WO CONTRAST  Result Date: 06/26/2022 CLINICAL DATA:  Abdominal pain, lower extremity swelling EXAM: CT ABDOMEN AND PELVIS WITHOUT CONTRAST TECHNIQUE: Multidetector CT imaging of the abdomen and pelvis was performed following the standard protocol without IV contrast. Unenhanced CT was performed per clinician order. Lack of IV contrast limits sensitivity and specificity, especially for evaluation of abdominal/pelvic solid viscera. RADIATION DOSE REDUCTION: This exam was performed according to the departmental dose-optimization program which includes automated exposure control, adjustment of the mA and/or kV according to patient size and/or use of iterative reconstruction technique. COMPARISON:  05/09/2021 FINDINGS: Lower chest: Dense right lower lobe consolidation consistent with atelectasis. The large right pleural effusion increased since prior exam. Continued cardiomegaly without pericardial effusion. Hepatobiliary: Nodular contour of the liver capsule consistent with cirrhosis. No focal abnormality on this limited unenhanced exam. No evidence of cholelithiasis or cholecystitis. Pancreas:  Stable 13 mm hypodensity ventral margin pancreatic body. Otherwise unremarkable unenhanced appearance. Spleen: Unremarkable unenhanced appearance. Adrenals/Urinary Tract: No urinary tract calculi or obstructive uropathy. The adrenals and bladder are unremarkable. Stomach/Bowel: No bowel obstruction or ileus. Normal appendix. No bowel wall thickening or inflammatory change. Vascular/Lymphatic: There is marked distension of the IVC above the level of an IVC filter, as well as distension of the hepatic veins. This is slightly more pronounced since prior study, likely reflects venous reflux some cardiac dysfunction. Stable position of the IVC filter below the right renal vein. Evaluation of the vascular lumen is limited without IV contrast. Stable atherosclerosis of the aorta and its branches. No pathologic adenopathy. Reproductive: Uterus and bilateral adnexa are unremarkable. Other: Subcutaneous edema throughout the lower anterior abdominal wall and dermal thickening, consistent with third spacing of fluid. Trace free fluid within the right upper quadrant. No free intraperitoneal gas. No abdominal wall hernia. Musculoskeletal: No acute or destructive bony lesions. Reconstructed images demonstrate no additional findings. IMPRESSION: 1. Cardiomegaly, with marked distension of the hepatic veins and suprarenal IVC compatible with cardiac dysfunction and venous reflux. 2. Large right pleural effusion with compressive right lower lobe atelectasis. 3. Subcutaneous edema and dermal wall thickening as above consistent with third spacing of fluid or venous congestion. 4. Nodular contour of the liver compatible with cirrhosis. 5. Trace ascites. 6. Stable indeterminate 13 mm hypodensity within the pancreatic body. If not previously evaluated, definitive characterization with nonemergent outpatient MRI with and without contrast may be useful. 7.  Aortic Atherosclerosis (ICD10-I70.0). Electronically Signed   By: Randa Ngo M.D.    On: 06/26/2022 17:39   DG Chest 2 View  Result Date: 06/26/2022 CLINICAL DATA:  Shortness of breath. EXAM: CHEST - 2 VIEW COMPARISON:  October 28, 2021. FINDINGS: Stable cardiomegaly. Increased right basilar opacity is noted most consistent with subsegmental atelectasis or edema with associated pleural effusion. Bony thorax is unremarkable. IMPRESSION: Increased right basilar opacity is noted most consistent with subsegmental atelectasis or asymmetric edema with associated pleural effusion. Electronically Signed   By: Marijo Conception M.D.   On: 06/26/2022 15:31     Scheduled Meds:  aspirin EC  81 mg Oral Daily   enoxaparin (LOVENOX) injection  30 mg Subcutaneous Q24H   hydrocerin   Topical BID   insulin aspart  0-5 Units Subcutaneous QHS   insulin aspart  0-9 Units Subcutaneous TID WC   lactose free nutrition  237 mL Oral TID WC   pantoprazole  40 mg Oral Q1200   sodium chloride flush  3 mL Intravenous Q12H   Continuous Infusions:  sodium chloride     furosemide (LASIX) 200 mg in dextrose 5 % 100 mL (2 mg/mL) infusion 20 mg/hr (06/28/22 0452)   milrinone       LOS: 2 days    Time spent: 42min    Domenic Polite, MD [facility]   06/28/2022, 12:08 PM

## 2022-06-28 NOTE — Progress Notes (Signed)
Pt had 10 bts NSVT, pt is alert, no complains. Offered CPAP but pt still refused. Educated.

## 2022-06-28 NOTE — Progress Notes (Signed)
PT refusing CPAP. Pt on 4LNC. No resp distress noted. Will continue to monitor. Cpap in room on standby.

## 2022-06-28 NOTE — Progress Notes (Signed)
Orthopedic Tech Progress Note Patient Details:  Rita [name] [birth year] [id number]  Ortho Devices Type of Ortho Device: Louretta Parma boot Ortho Device/Splint Location: BLE Ortho Device/Splint Interventions: Ordered, Application   Post Interventions Patient Tolerated: Well  Jody Silas A [name] 06/28/2022, 3:59 PM

## 2022-06-28 NOTE — TOC Initial Note (Signed)
Transition of Care [facility]) - Initial/Assessment Note    Patient Details  Name: Rita [name] MRN: [id number] Date of Birth: 17-Feb-1956  [facility] CM/SW Contact:    Erenest Rasher, RN Phone Number: [phone] 06/28/2022, 10:12 AM  Clinical Narrative:                 HF TOC CM spoke to pt at bedside. Pt states her CPAP was picked up by the DME provider due to her not using device. Gave permission to call husband to follow up. Contacted husband and states provider, [facility] picked up both their CPAP devices because they stated they were not using in the home. Husband states pt was using her CPAP up to 4 hours each night. Pt also has RW, bedside commode, scale, and cane. TOC contacted DME and pt did not meet requirements to keep device in the home. She will need a new sleep study. Will continue to follow for dc needs.   Expected Discharge Plan: [place] Barriers to Discharge: Continued Medical Work up   Patient Goals and CMS Choice Patient states their goals for this hospitalization and ongoing recovery are:: wants to get better CMS Medicare.gov Compare Post Acute Care list provided to:: Patient    Expected Discharge Plan and Services Expected Discharge Plan: [place]   Discharge Planning Services: CM Consult   Living arrangements for the past 2 months: Single Family Home                                      Prior Living Arrangements/Services Living arrangements for the past 2 months: Single Family Home Lives with:: Spouse Patient language and need for interpreter reviewed:: Yes Do you feel safe going back to the place where you live?: Yes      Need for Family Participation in Patient Care: Yes (Comment) Care giver support system in place?: Yes (comment) Current home services: DME Criminal Activity/Legal Involvement Pertinent to Current Situation/Hospitalization: No - Comment as  needed  Activities of Daily Living Home Assistive Devices/Equipment: Cane (specify quad or straight) ADL Screening (condition at time of admission) Patient's cognitive ability adequate to safely complete daily activities?: Yes Is the patient deaf or have difficulty hearing?: No Does the patient have difficulty seeing, even when wearing glasses/contacts?: No Does the patient have difficulty concentrating, remembering, or making decisions?: No Patient able to express need for assistance with ADLs?: Yes Does the patient have difficulty dressing or bathing?: No Independently performs ADLs?: Yes (appropriate for developmental age) Does the patient have difficulty walking or climbing stairs?: Yes Weakness of Legs: Both Weakness of Arms/Hands: None  Permission Sought/Granted Permission sought to share information with : Case Manager, Family Supports, PCP Permission granted to share information with : Yes, Verbal Permission Granted  Share Information with NAME: Rita [name]     Permission granted to share info w Relationship: husband  Permission granted to share info w Contact Information: [phone]  Emotional Assessment Appearance:: Appears stated age Attitude/Demeanor/Rapport: Engaged Affect (typically observed): Accepting Orientation: : Oriented to Self, Oriented to Place, Oriented to  Time, Oriented to Situation   Psych Involvement: No (comment)  Admission diagnosis:  Anasarca [R60.1] AKI (acute kidney injury) ([place]) [N17.9] Acute on chronic diastolic CHF (congestive heart failure) (HCC) [I50.33] Acute on chronic congestive heart failure, unspecified heart failure type ([place]) [I50.9] Patient Active  Problem List   Diagnosis Date Noted   Malnutrition of moderate degree 06/28/2022   Acute on chronic diastolic CHF (congestive heart failure) ([place]) 06/26/2022   Cirrhosis of liver ([place]) 06/26/2022   Elevated troponin 06/26/2022   Edema 06/26/2022   OSA (obstructive sleep apnea)  02/13/2022   Secondary hypercoagulable state ([place]) 44/62/8638   Acute systolic heart failure ([place]) 08/28/2020   Pulmonary hypertension ([place]) 08/28/2020   Gout 08/02/2020   Constipation 08/02/2020   Thrombocytopenia ([place]) 07/23/2020   Anemia 07/23/2020   Acute on chronic combined systolic and diastolic heart failure ([place]) 07/21/2020   Acute exacerbation of congestive heart failure ([place]) 06/27/2020   Goiter    Morbid obesity ([place]) 06/19/2020   Neck mass 06/18/2020   Acute on chronic respiratory failure with hypoxia ([place]) 06/16/2020   Diabetes mellitus without complication ([place])    Persistent atrial fibrillation ([place])    CKD (chronic kidney disease), stage III ([place])    Hypertension associated with diabetes ([place])    PCP:  [name], MD Pharmacy:   [facility] [place] [place] [address] Phone: [phone] Fax: [phone]     Social Determinants of Health (SDOH) Interventions    Readmission Risk Interventions    08/30/2020    9:02 AM 07/25/2020    4:05 PM  Readmission Risk Prevention Plan  Transportation Screening Complete Complete  PCP or Specialist Appt within 3-5 Days Complete Complete  HRI or Home Care Consult Complete Complete  Social Work Consult for [place] Planning/Counseling Complete Complete  Palliative Care Screening Not Applicable Not Applicable  Medication Review Press photographer) Complete Complete

## 2022-06-28 NOTE — Care Management Important Message (Signed)
Important Message  Patient Details  Name: Rita [name] MRN: [id number] Date of Birth: 1955/12/15   Medicare Important Message Given:  Yes     [name] 06/28/2022, 9:46 AM

## 2022-06-29 DIAGNOSIS — I5033 Acute on chronic diastolic (congestive) heart failure: Secondary | ICD-10-CM | POA: Diagnosis not present

## 2022-06-29 LAB — CBC
HCT: 24.4 % — ABNORMAL LOW (ref 36.0–46.0)
Hemoglobin: 8.2 g/dL — ABNORMAL LOW (ref 12.0–15.0)
MCH: 27.7 pg (ref 26.0–34.0)
MCHC: 33.6 g/dL (ref 30.0–36.0)
MCV: 82.4 fL (ref 80.0–100.0)
Platelets: 137 10*3/uL — ABNORMAL LOW (ref 150–400)
RBC: 2.96 MIL/uL — ABNORMAL LOW (ref 3.87–5.11)
RDW: 13.9 % (ref 11.5–15.5)
WBC: 4.5 10*3/uL (ref 4.0–10.5)
nRBC: 0 % (ref 0.0–0.2)

## 2022-06-29 LAB — RENAL FUNCTION PANEL
Albumin: 3 g/dL — ABNORMAL LOW (ref 3.5–5.0)
Anion gap: 12 (ref 5–15)
BUN: 96 mg/dL — ABNORMAL HIGH (ref 8–23)
CO2: 36 mmol/L — ABNORMAL HIGH (ref 22–32)
Calcium: 8.7 mg/dL — ABNORMAL LOW (ref 8.9–10.3)
Chloride: 87 mmol/L — ABNORMAL LOW (ref 98–111)
Creatinine, Ser: 3.5 mg/dL — ABNORMAL HIGH (ref 0.44–1.00)
GFR, Estimated: 14 mL/min — ABNORMAL LOW (ref >60)
Glucose, Bld: 138 mg/dL — ABNORMAL HIGH (ref 70–99)
Phosphorus: 3.5 mg/dL (ref 2.5–4.6)
Potassium: 3.2 mmol/L — ABNORMAL LOW (ref 3.5–5.1)
Sodium: 135 mmol/L (ref 135–145)

## 2022-06-29 LAB — GLUCOSE, CAPILLARY
Glucose-Capillary: 131 mg/dL — ABNORMAL HIGH (ref 70–99)
Glucose-Capillary: 241 mg/dL — ABNORMAL HIGH (ref 70–99)
Glucose-Capillary: 264 mg/dL — ABNORMAL HIGH (ref 70–99)
Glucose-Capillary: 189 mg/dL — ABNORMAL HIGH (ref 70–99)

## 2022-06-29 MED ORDER — BISACODYL 10 MG RE SUPP: 10.0000 mg | Freq: Every day | RECTAL | Status: DC | PRN

## 2022-06-29 MED ORDER — POTASSIUM CHLORIDE CRYS ER 20 MEQ PO TBCR
40.0000 meq | EXTENDED_RELEASE_TABLET | Freq: Two times a day (BID) | ORAL | Status: AC
  Administered 2022-06-29 (×2): 40 meq via ORAL
  Filled 2022-06-29 (×2): qty 2

## 2022-06-29 MED ORDER — BISACODYL 5 MG PO TBEC
10.0000 mg | DELAYED_RELEASE_TABLET | Freq: Every day | ORAL | Status: DC | PRN
Start: 2022-06-29 — End: 2022-07-04
  Administered 2022-06-29: 10 mg via ORAL
  Filled 2022-06-29: qty 2

## 2022-06-29 NOTE — Progress Notes (Signed)
PROGRESS NOTE    Rita [name]  PZW:[id number] DOB: Jul 08, 1956 DOA: 06/26/2022 PCP: [name], MD  66/F with history of chronic systolic CHF, EF recovered, CKD 4, history of chronic GI bleed/SB AVMs, paroxysmal A-fib not on anticoagulation, type 2 diabetes mellitus, history of DVT/PE, IVC filter, chronic respiratory failure on 2 L home O2, obesity presented to the ED with worsening edema, abdominal distention.  Seen in clinic a week ago, torsemide dose increased -CT abdomen pelvis noted cardiomegaly, marked distention of hepatic veins and suprarenal IVC, large right pleural effusion, abdominal wall edema, liver cirrhosis, trace ascites and hypodensity in pancreatic body  -7/20 started Lasix gtt, R Thora -1.2L drained -7/21 starting milrinone, advanced heart failure team and nephrology following  Subjective: -Feels better, breathing continues to improve  Assessment and Plan:  Acute on chronic systolic CHF Large right pleural effusion -BiV failure, predominantly right heart failure -EF recovered to 55%, on echo 1/23 -Repeat echo with preserved EF, severe PAH and severe TR -Advanced heart failure team following, now on Lasix gtt, milrinone -Diuresing, 4 L negative -Hold Coreg and hydralazine -GDMT limited by AKI/CKD -s/p thoracentesis in IR , 1.2 L drained -GDMT limited by CKD 4/AKI -TED hose ordered yesterday, pending  AKI on CKD 4 -Baseline creatinine around 2.6, now 3.5, BUN-96 -Suspect this is cardiorenal, diuretics as above, avoid hypotension -Advanced heart failure team consulted, followed by Dr.Foster -Continue milrinone, appreciate nephrology input, not a good HD candidate -Diuretics as above  Chronic anemia -Secondary to CKD 4 and chronic GI bleed, stable, -Anemia panel more suggestive of chronic disease  Cirrhosis -Secondary to right heart failure, no history of chronic hepatitis or alcoholism -Diuretics as above, avoid Aldactone with CKD 4  Chronic  hypoxic respiratory failure -Reportedly CPAP was taken away from her home recently, -Continue CPAP nightly, TOC consult for home CPAP  Chronic A-fib -Rate controlled, Coreg on hold at this time, not on anticoagulation due to recurrent GI bleeds from small bowel AVMs  History of recurrent GI bleeds -History of small bowel AVMs, hemoglobin stable at this time, monitor  History of DVT/PE -Following orthopedic surgery, has IVC filter  Type 2 diabetes mellitus -CBGs are stable, hemoglobin A1c was 7.0  DVT prophylaxis:  Lovenox Code Status: Full code Family Communication: Discussed with patient detail, no family at bedside Disposition Plan: Anticipate long hospitalization  Consultants: Cardiology, nephrology   Procedures:   Antimicrobials:    Objective: Vitals:   06/29/22 0026 06/29/22 0426 06/29/22 0500 06/29/22 0848  BP: 118/63 (!) 112/46  (!) 117/43  Pulse: 81 84  87  Resp: 17 18  18   Temp: 98.3 F (36.8 C) 97.6 F (36.4 C)  98.7 F (37.1 C)  TempSrc: Oral Oral  Oral  SpO2: 98% 98%  92%  Weight:   90.1 kg   Height:        Intake/Output Summary (Last 24 hours) at 06/29/2022 1257 Last data filed at 06/29/2022 0800 Gross per 24 hour  Intake 744.75 ml  Output 2600 ml  Net -1855.25 ml   Filed Weights   06/27/22 0200 06/28/22 0500 06/29/22 0500  Weight: 93 kg 92 kg 90.1 kg    Examination:  General exam: Obese chronically ill female, sitting up in bed, AAOx3, no distress HEENT: Positive JVD CVS: S1-S2, irregularly irregular rhythm, Lungs: Clear bilaterally Abdomen: Soft, obese, nontender, bowel sounds present Extremities: 1-2+ edema, Unna boots on  Skin: No rashes Psychiatry:  Mood & affect appropriate.     Data  Reviewed:   CBC: Recent Labs  Lab 06/26/22 1443 06/27/22 0254 06/27/22 0805 06/28/22 0340 06/29/22 0312  WBC 4.2 3.7* 3.8* 4.5 4.5  NEUTROABS  --  2.5  --   --   --   HGB 8.6* 8.0* 8.3* 8.2* 8.2*  HCT 25.0* 23.1* 24.0* 24.0* 24.4*  MCV  82.5 82.8 82.8 82.5 82.4  PLT 119* 115* 120* 129* 413*   Basic Metabolic Panel: Recent Labs  Lab 06/26/22 1212 06/27/22 0254 06/27/22 0805 06/27/22 1119 06/28/22 0340 06/29/22 0312  NA 135  --  140  --  139 135  K 3.7  --  3.2*  --  3.7 3.2*  CL 91*  --  91*  --  92* 87*  CO2 34*  --  36*  --  35* 36*  GLUCOSE 151*  --  76  --  125* 138*  BUN 109*  --  115*  --  103* 96*  CREATININE 3.68*  --  3.56*  --  3.66* 3.50*  CALCIUM 9.5  --  9.1  --  9.1 8.7*  MG  --  2.8*  --  3.0* 2.7*  --   PHOS  --  5.1*  --  4.8*  --  3.5   GFR: Estimated Creatinine Clearance: 15.7 mL/min (A) (by C-G formula based on SCr of 3.5 mg/dL (H)). Liver Function Tests: Recent Labs  Lab 06/27/22 0254 06/28/22 0340 06/29/22 0312  AST 21 20  --   ALT 16 13  --   ALKPHOS 81 79  --   BILITOT 1.9* 1.9*  --   PROT 7.3 6.9  --   ALBUMIN 3.2* 3.1* 3.0*   No results for input(s): "LIPASE", "AMYLASE" in the last 168 hours. Recent Labs  Lab 06/27/22 0254  AMMONIA 44*   Coagulation Profile: Recent Labs  Lab 06/27/22 0254  INR 1.4*   Cardiac Enzymes: Recent Labs  Lab 06/27/22 0254  CKTOTAL 74   BNP (last 3 results) No results for input(s): "PROBNP" in the last 8760 hours. HbA1C: Recent Labs    06/27/22 0254  HGBA1C 7.0*   CBG: Recent Labs  Lab 06/28/22 1138 06/28/22 1517 06/28/22 2129 06/29/22 0623 06/29/22 1205  GLUCAP 244* 194* 218* 131* 241*   Lipid Profile: No results for input(s): "CHOL", "HDL", "LDLCALC", "TRIG", "CHOLHDL", "LDLDIRECT" in the last 72 hours. Thyroid Function Tests: Recent Labs    06/27/22 0254  TSH 0.592   Anemia Panel: Recent Labs    06/27/22 0254  VITAMINB12 829  FOLATE 39.3  FERRITIN 245  TIBC 225*  IRON 50  RETICCTPCT 0.9   Urine analysis:    Component Value Date/Time   COLORURINE STRAW (A) 06/26/2022 2036   APPEARANCEUR CLEAR 06/26/2022 2036   LABSPEC 1.006 06/26/2022 2036   PHURINE 5.0 06/26/2022 2036   GLUCOSEU NEGATIVE 06/26/2022  2036   HGBUR NEGATIVE 06/26/2022 2036   BILIRUBINUR NEGATIVE 06/26/2022 2036   KETONESUR NEGATIVE 06/26/2022 2036   PROTEINUR NEGATIVE 06/26/2022 2036   NITRITE NEGATIVE 06/26/2022 2036   LEUKOCYTESUR NEGATIVE 06/26/2022 2036   Sepsis Labs: @LABRCNTIP (procalcitonin:4,lacticidven:4)  ) Recent Results (from the past 240 hour(s))  Culture, body fluid w Gram Stain-bottle     Status: None (Preliminary result)   Collection Time: 06/27/22  3:02 PM   Specimen: Pleura  Result Value Ref Range Status   Specimen Description PLEURAL  Final   Special Requests RIGHT LUNG  Final   Culture   Final    NO GROWTH 2 DAYS Performed  at [facility], [place] [address], [place], [place] [address]    Report Status PENDING  Incomplete  Gram stain     Status: None   Collection Time: 06/27/22  3:02 PM   Specimen: Fluid  Result Value Ref Range Status   Specimen Description FLUID  Final   Special Requests RIGHT LUNG  Final   Gram Stain   Final    WBC PRESENT, PREDOMINANTLY MONONUCLEAR NO ORGANISMS SEEN CYTOSPIN SMEAR Performed at [facility], [place] [address], [place], [place] [address]    Report Status 06/27/2022 FINAL  Final     Radiology Studies: IR THORACENTESIS ASP PLEURAL SPACE W/IMG GUIDE  Result Date: 06/28/2022 INDICATION: Patient with history of congestive heart failure presents with shortness of breath, previous imaging showed right pleural effusion. Request for therapeutic and diagnostic thoracentesis. EXAM: ULTRASOUND GUIDED RIGHT THORACENTESIS MEDICATIONS: 10 mL 1% lidocaine COMPLICATIONS: None immediate. PROCEDURE: An ultrasound guided thoracentesis was thoroughly discussed with the patient and questions answered. The benefits, risks, alternatives and complications were also discussed. The patient understands and wishes to proceed with the procedure. Written consent was obtained. Ultrasound was performed to localize and mark an adequate pocket of fluid in the right chest. The area  was then prepped and draped in the normal sterile fashion. 1% Lidocaine was used for local anesthesia. Under ultrasound guidance a 6 Fr Safe-T-Centesis catheter was introduced. Thoracentesis was performed. The catheter was removed and a dressing applied. FINDINGS: A total of approximately 1.2 L of hazy amber fluid was removed. Samples were sent to the laboratory as requested by the clinical team. Post procedure chest X-ray reviewed, negative for pneumothorax. IMPRESSION: Successful ultrasound guided right thoracentesis yielding 1.2 L of pleural fluid. Read by: Durenda [place], PA-C Electronically Signed   By: Aletta Edouard M.D.   On: 06/28/2022 08:08   ECHOCARDIOGRAM COMPLETE  Result Date: 06/28/2022    ECHOCARDIOGRAM REPORT   Patient Name:   ROHINI JAROSZEWSKI MARBLE Sharpless Date of Exam: 06/27/2022 Medical Rec #:  [id number]                  Height:       61.0 in Accession #:    [id number]                 Weight:       205.0 lb Date of Birth:  March 11, 1956                   BSA:          1.909 m Patient Age:    89 years                   BP:           117/56 mmHg Patient Gender: F                          HR:           70 bpm. Exam Location:  Inpatient Procedure: 2D Echo Indications:    acute diastolic chf  History:        Patient has prior history of Echocardiogram examinations, most                 recent 12/28/2021. Chronic kidney disease. Cirrhosis,                 Arrythmias:Atrial Fibrillation, Signs/Symptoms:Edema; Risk  Factors:Diabetes, Hypertension and Sleep Apnea.  Sonographer:    Johny Chess RDCS Referring Phys: [place]  1. Left ventricular ejection fraction, by estimation, is 60 to 65%. The left ventricle has normal function. The left ventricle has no regional wall motion abnormalities. There is mild left ventricular hypertrophy of the lateral segment. Left ventricular  diastolic parameters are indeterminate. There is the interventricular septum is flattened in  systole and diastole, consistent with right ventricular pressure and volume overload.  2. Right ventricular systolic function is normal. The right ventricular size is mildly enlarged. There is severely elevated pulmonary artery systolic pressure.  3. Left atrial size was severely dilated.  4. There is a hyperechoic density on the posterior mitral valve annulus measuring 0.8 x 0.7 cm. This was present on the echo 08/2020, though it has increased in size. Likely represents mital annular calcification. The mitral valve is normal in structure.  Trivial mitral valve regurgitation. No evidence of mitral stenosis.  5. Tricuspid valve regurgitation is severe.  6. The aortic valve is tricuspid. Aortic valve regurgitation is not visualized. No aortic stenosis is present.  7. The inferior vena cava is dilated in size with <50% respiratory variability, suggesting right atrial pressure of 15 mmHg. FINDINGS  Left Ventricle: Left ventricular ejection fraction, by estimation, is 60 to 65%. The left ventricle has normal function. The left ventricle has no regional wall motion abnormalities. The left ventricular internal cavity size was normal in size. There is  mild left ventricular hypertrophy of the lateral segment. The interventricular septum is flattened in systole and diastole, consistent with right ventricular pressure and volume overload. Left ventricular diastolic function could not be evaluated due to  atrial fibrillation. Left ventricular diastolic parameters are indeterminate. Right Ventricle: The right ventricular size is mildly enlarged. No increase in right ventricular wall thickness. Right ventricular systolic function is normal. There is severely elevated pulmonary artery systolic pressure. The tricuspid regurgitant velocity is 3.44 m/s, and with an assumed right atrial pressure of 15 mmHg, the estimated right ventricular systolic pressure is 33.8 mmHg. Left Atrium: Left atrial size was severely dilated. Right Atrium:  Right atrial size was normal in size. Pericardium: There is no evidence of pericardial effusion. Mitral Valve: There is a hyperechoic density on the posterior mitral valve annulus measuring 0.8 x 0.7 cm. This was present on the echo 08/2020, though it has increased in size. Likely represents mital annular calcification. The mitral valve is normal in structure. Mild to moderate mitral annular calcification. Trivial mitral valve regurgitation. No evidence of mitral valve stenosis. Tricuspid Valve: The tricuspid valve is normal in structure. Tricuspid valve regurgitation is severe. No evidence of tricuspid stenosis. Aortic Valve: The aortic valve is tricuspid. Aortic valve regurgitation is not visualized. No aortic stenosis is present. Pulmonic Valve: The pulmonic valve was normal in structure. Pulmonic valve regurgitation is not visualized. No evidence of pulmonic stenosis. Aorta: The aortic root is normal in size and structure. Venous: The inferior vena cava is dilated in size with less than 50% respiratory variability, suggesting right atrial pressure of 15 mmHg. IAS/Shunts: No atrial level shunt detected by color flow Doppler.  LEFT VENTRICLE PLAX 2D LVIDd:         4.10 cm LVIDs:         2.60 cm LV PW:         1.20 cm LV IVS:        0.90 cm LVOT diam:     1.70 cm LV SV:  46 LV SV Index:   24 LVOT Area:     2.27 cm  RIGHT VENTRICLE            IVC RV Basal diam:  3.40 cm    IVC diam: 3.10 cm RV S prime:     9.36 cm/s TAPSE (M-mode): 1.1 cm LEFT ATRIUM              Index        RIGHT ATRIUM           Index LA diam:        4.10 cm  2.15 cm/m   RA Area:     24.70 cm LA Vol (A2C):   116.0 ml 60.77 ml/m  RA Volume:   74.20 ml  38.87 ml/m LA Vol (A4C):   62.1 ml  32.53 ml/m LA Biplane Vol: 86.7 ml  45.42 ml/m  AORTIC VALVE LVOT Vmax:   111.00 cm/s LVOT Vmean:  71.700 cm/s LVOT VTI:    0.202 m  AORTA Ao Root diam: 2.50 cm Ao Asc diam:  3.00 cm TRICUSPID VALVE TR Peak grad:   47.3 mmHg TR Vmax:        344.00  cm/s  SHUNTS Systemic VTI:  0.20 m Systemic Diam: 1.70 cm Skeet Latch MD Electronically signed by Skeet Latch MD Signature Date/Time: 06/28/2022/6:20:54 AM    Final    US RENAL  Result Date: 06/27/2022 CLINICAL DATA:  Acute renal insufficiency EXAM: RENAL / URINARY TRACT ULTRASOUND COMPLETE COMPARISON:  06/26/2022 FINDINGS: Right Kidney: Renal measurements: 9.0 x 4.8 x 4.5 cm = volume: 100 mL. Echogenicity within normal limits. No mass or hydronephrosis visualized. Left Kidney: Renal measurements: 9.1 x 4.4 x 5.1 cm = volume: 106 mL. Echogenicity within normal limits. No mass or hydronephrosis visualized. Bladder: Appears normal for degree of bladder distention. Other: None. IMPRESSION: 1. Unremarkable renal ultrasound. Electronically Signed   By: Randa Ngo M.D.   On: 06/27/2022 16:27   DG Chest 1 View  Result Date: 06/27/2022 CLINICAL DATA:  A 66 year old female presents following thoracentesis on the RIGHT post removal of 1.2 L by report. EXAM: CHEST  1 VIEW COMPARISON:  06/26/2022. FINDINGS: EKG leads project over the chest. Trachea midline. Cardiomediastinal contours with cardiac enlargement and engorgement of central pulmonary vasculature. Bibasilar airspace disease with improved aeration at the RIGHT lung base following near complete resolution of pleural fluid. No pneumothorax. On limited assessment there is no acute skeletal finding. IMPRESSION: Improved aeration at the RIGHT lung base following near complete resolution of right effusion. No pneumothorax. Persistent bibasilar airspace disease and marked cardiomegaly. Electronically Signed   By: Zetta Bills M.D.   On: 06/27/2022 14:57     Scheduled Meds:  aspirin EC  81 mg Oral Daily   enoxaparin (LOVENOX) injection  30 mg Subcutaneous Q24H   hydrocerin   Topical BID   insulin aspart  0-5 Units Subcutaneous QHS   insulin aspart  0-9 Units Subcutaneous TID WC   lactose free nutrition  237 mL Oral TID WC   pantoprazole  40 mg  Oral Q1200   potassium chloride  40 mEq Oral BID   sodium chloride flush  3 mL Intravenous Q12H   Continuous Infusions:  sodium chloride     furosemide (LASIX) 200 mg in dextrose 5 % 100 mL (2 mg/mL) infusion 20 mg/hr (06/29/22 0954)   milrinone 0.125 mcg/kg/min (06/29/22 0958)     LOS: 3 days    Time spent: 59min  Domenic Polite, MD [facility]   06/29/2022, 12:57 PM

## 2022-06-29 NOTE — Progress Notes (Signed)
[facility] Progress Note    Assessment/ Plan:    AKI/CKD stage IV - presumably due to cardiorenal syndrome.  Continue with IV diuresis and follow UOP and daily Scr levels.  No urgent indication for dialysis patients at this time. Given advanced RV failure, would not be an ideal long term dialysis candidate as risks>benefits, discussed this briefly with patient and she does agree--ongoing discussion. Advised her to think about this and discuss w/ family, discussed this again today. Would recommend a palliative consultation as well to address GOC--patient is okay with this  on lasix gtt and milrinone per HF. Cr slightly better today. Having some evidence of contraction alkalosis on labs, may need to back off on lasix soon Renal dose meds for eGFR <15.   Avoid nephrotoxic agents such as IV contrast, NSAIDs/Cox-II I's, phosphate containing bowel preps (FLEETS). 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 Chronic hypoxemic respiratory failure - due to OHS/OSA.  Cpap in hospital however patient continues to refuse Atrial fibrillation -  chronic.  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 HTN - stable H/o DVT/PE - s/p IVC Right pleural effusion - s/p right thoracentesis 7/21 with IR and removed 1.2 Liters Cardiac cirrhosis - GI consulted, signed off. 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 She reports feeling better today in regards to swelling and breathing. Denies any nausea/vomiting, chest pain, loss of appetite.   Objective:   BP (!) 112/46 (BP Location: Right Arm)   Pulse 84   Temp 97.6 F (36.4 C) (Oral)   Resp 18   Ht $R'5\' 1"'dU$  (1.549 m)   Wt 90.1 kg   SpO2 98%   BMI 37.53 kg/m   Intake/Output Summary (Last 24 hours) at  06/29/2022 0817 Last data filed at 06/29/2022 8676 Gross per 24 hour  Intake 894.75 ml  Output 2200 ml  Net -1305.25 ml   Weight change: -1.9 kg  Physical Exam: Gen:ill appearing, NAD, sitting up in bed CVS:RRR Resp:diminished air entry bibasilar HMC:NOBSJ, soft Ext: no sig edema (legs wrapped) Neuro: awake, alert  Imaging: IR THORACENTESIS ASP PLEURAL SPACE W/IMG GUIDE  Result Date: 06/28/2022 INDICATION: Patient with history of congestive heart failure presents with shortness of breath, previous imaging showed right pleural effusion. Request for therapeutic and diagnostic thoracentesis. EXAM: ULTRASOUND GUIDED RIGHT THORACENTESIS MEDICATIONS: 10 mL 1% lidocaine COMPLICATIONS: None immediate. PROCEDURE: An ultrasound guided thoracentesis was thoroughly discussed with the patient and questions answered. The benefits, risks, alternatives and complications were also discussed. The patient understands and wishes to proceed with the procedure. Written consent was obtained. Ultrasound was performed to localize and mark an adequate pocket of fluid in the right chest. The area was then prepped and draped in the normal sterile fashion. 1% Lidocaine was used for local anesthesia. Under ultrasound guidance a 6 Fr Safe-T-Centesis catheter was introduced. Thoracentesis was performed. The catheter was removed and a dressing applied. FINDINGS: A total of approximately 1.2 L of hazy amber fluid was removed. Samples were sent to the laboratory as requested by the clinical team. Post procedure chest X-ray reviewed, negative for pneumothorax. IMPRESSION: Successful ultrasound guided right thoracentesis yielding 1.2 L of pleural fluid. Read by: Durenda Guthrie, PA-C Electronically Signed   By: Aletta Edouard M.D.   On: 06/28/2022 08:08  ECHOCARDIOGRAM COMPLETE  Result Date: 06/28/2022    ECHOCARDIOGRAM REPORT   Patient Name:   ARIANY KESSELMAN MARBLE Launer Date of Exam: 06/27/2022 Medical Rec #:  [id number]                   Height:       61.0 in Accession #:    [id number]                 Weight:       205.0 lb Date of Birth:  Nov 28, 1956                   BSA:          1.909 m Patient Age:    66 years                   BP:           117/56 mmHg Patient Gender: F                          HR:           70 bpm. Exam Location:  Inpatient Procedure: 2D Echo Indications:    acute diastolic chf  History:        Patient has prior history of Echocardiogram examinations, most                 recent 12/28/2021. Chronic kidney disease. Cirrhosis,                 Arrythmias:Atrial Fibrillation, Signs/Symptoms:Edema; Risk                 Factors:Diabetes, Hypertension and Sleep Apnea.  Sonographer:    Johny Chess RDCS Referring Phys: Renner Corner  1. Left ventricular ejection fraction, by estimation, is 60 to 65%. The left ventricle has normal function. The left ventricle has no regional wall motion abnormalities. There is mild left ventricular hypertrophy of the lateral segment. Left ventricular  diastolic parameters are indeterminate. There is the interventricular septum is flattened in systole and diastole, consistent with right ventricular pressure and volume overload.  2. Right ventricular systolic function is normal. The right ventricular size is mildly enlarged. There is severely elevated pulmonary artery systolic pressure.  3. Left atrial size was severely dilated.  4. There is a hyperechoic density on the posterior mitral valve annulus measuring 0.8 x 0.7 cm. This was present on the echo 08/2020, though it has increased in size. Likely represents mital annular calcification. The mitral valve is normal in structure.  Trivial mitral valve regurgitation. No evidence of mitral stenosis.  5. Tricuspid valve regurgitation is severe.  6. The aortic valve is tricuspid. Aortic valve regurgitation is not visualized. No aortic stenosis is present.  7. The inferior vena cava is dilated in size with <50% respiratory  variability, suggesting right atrial pressure of 15 mmHg. FINDINGS  Left Ventricle: Left ventricular ejection fraction, by estimation, is 60 to 65%. The left ventricle has normal function. The left ventricle has no regional wall motion abnormalities. The left ventricular internal cavity size was normal in size. There is  mild left ventricular hypertrophy of the lateral segment. The interventricular septum is flattened in systole and diastole, consistent with right ventricular pressure and volume overload. Left ventricular diastolic function could not be evaluated due to  atrial fibrillation. Left ventricular diastolic parameters are indeterminate. Right Ventricle: The right ventricular size is mildly enlarged. No  increase in right ventricular wall thickness. Right ventricular systolic function is normal. There is severely elevated pulmonary artery systolic pressure. The tricuspid regurgitant velocity is 3.44 m/s, and with an assumed right atrial pressure of 15 mmHg, the estimated right ventricular systolic pressure is 79.0 mmHg. Left Atrium: Left atrial size was severely dilated. Right Atrium: Right atrial size was normal in size. Pericardium: There is no evidence of pericardial effusion. Mitral Valve: There is a hyperechoic density on the posterior mitral valve annulus measuring 0.8 x 0.7 cm. This was present on the echo 08/2020, though it has increased in size. Likely represents mital annular calcification. The mitral valve is normal in structure. Mild to moderate mitral annular calcification. Trivial mitral valve regurgitation. No evidence of mitral valve stenosis. Tricuspid Valve: The tricuspid valve is normal in structure. Tricuspid valve regurgitation is severe. No evidence of tricuspid stenosis. Aortic Valve: The aortic valve is tricuspid. Aortic valve regurgitation is not visualized. No aortic stenosis is present. Pulmonic Valve: The pulmonic valve was normal in structure. Pulmonic valve regurgitation is not  visualized. No evidence of pulmonic stenosis. Aorta: The aortic root is normal in size and structure. Venous: The inferior vena cava is dilated in size with less than 50% respiratory variability, suggesting right atrial pressure of 15 mmHg. IAS/Shunts: No atrial level shunt detected by color flow Doppler.  LEFT VENTRICLE PLAX 2D LVIDd:         4.10 cm LVIDs:         2.60 cm LV PW:         1.20 cm LV IVS:        0.90 cm LVOT diam:     1.70 cm LV SV:         46 LV SV Index:   24 LVOT Area:     2.27 cm  RIGHT VENTRICLE            IVC RV Basal diam:  3.40 cm    IVC diam: 3.10 cm RV S prime:     9.36 cm/s TAPSE (M-mode): 1.1 cm LEFT ATRIUM              Index        RIGHT ATRIUM           Index LA diam:        4.10 cm  2.15 cm/m   RA Area:     24.70 cm LA Vol (A2C):   116.0 ml 60.77 ml/m  RA Volume:   74.20 ml  38.87 ml/m LA Vol (A4C):   62.1 ml  32.53 ml/m LA Biplane Vol: 86.7 ml  45.42 ml/m  AORTIC VALVE LVOT Vmax:   111.00 cm/s LVOT Vmean:  71.700 cm/s LVOT VTI:    0.202 m  AORTA Ao Root diam: 2.50 cm Ao Asc diam:  3.00 cm TRICUSPID VALVE TR Peak grad:   47.3 mmHg TR Vmax:        344.00 cm/s  SHUNTS Systemic VTI:  0.20 m Systemic Diam: 1.70 cm Skeet Latch MD Electronically signed by Skeet Latch MD Signature Date/Time: 06/28/2022/6:20:54 AM    Final    VAS [place] LOWER EXTREMITY VENOUS (DVT)  Result Date: 06/27/2022  Lower Venous DVT Study Patient Name:  EATHER CHAIRES MARBLE Nolte  Date of Exam:   06/27/2022 Medical Rec #: [id number]                   Accession #:    [id number] Date of Birth: [birth year]  Patient Gender: F Patient Age:   66 years Exam Location:  [facility] Procedure:      VAS [place] LOWER EXTREMITY VENOUS (DVT) Referring Phys: Nyoka Lint DOUTOVA --------------------------------------------------------------------------------  Indications: Edema.  Risk Factors: Obesity and Hx of DVT s/p IVC filter, CHF, CKD, Afib, pulmonary HTN, cirrhosis. Limitations: Body habitus and  poor ultrasound/tissue interface. Comparison Study: No previous exams available for review Performing Technologist: Jody Hill RVT, RDMS  Examination Guidelines: A complete evaluation includes B-mode imaging, spectral Doppler, color Doppler, and power Doppler as needed of all accessible portions of each vessel. Bilateral testing is considered an integral part of a complete examination. Limited examinations for reoccurring indications may be performed as noted. The reflux portion of the exam is performed with the patient in reverse Trendelenburg.  +---------+---------------+---------+-----------+----------+-------------------+ RIGHT    CompressibilityPhasicitySpontaneityPropertiesThrombus Aging      +---------+---------------+---------+-----------+----------+-------------------+ CFV      Full           No       Yes                                      +---------+---------------+---------+-----------+----------+-------------------+ SFJ      Full                                                             +---------+---------------+---------+-----------+----------+-------------------+ FV Prox  Full           No       Yes                                      +---------+---------------+---------+-----------+----------+-------------------+ FV Mid   Full           No       Yes                                      +---------+---------------+---------+-----------+----------+-------------------+ FV DistalFull           No       Yes                                      +---------+---------------+---------+-----------+----------+-------------------+ PFV      Full                                                             +---------+---------------+---------+-----------+----------+-------------------+ POP      Full           No       Yes                                      +---------+---------------+---------+-----------+----------+-------------------+ PTV      Full  Not well visualized +---------+---------------+---------+-----------+----------+-------------------+ PERO     Full                                         Not well visualized +---------+---------------+---------+-----------+----------+-------------------+ Pulsatile flow throughout RLE. Several varicosties seen in area of groin, patent by color and compression.  +---------+---------------+---------+-----------+----------+-------------------+ LEFT     CompressibilityPhasicitySpontaneityPropertiesThrombus Aging      +---------+---------------+---------+-----------+----------+-------------------+ CFV      Full           No       Yes                                      +---------+---------------+---------+-----------+----------+-------------------+ SFJ      Full                                                             +---------+---------------+---------+-----------+----------+-------------------+ FV Prox  Full           No       Yes                                      +---------+---------------+---------+-----------+----------+-------------------+ FV Mid   Full           No       Yes                                      +---------+---------------+---------+-----------+----------+-------------------+ FV DistalFull           No       Yes                                      +---------+---------------+---------+-----------+----------+-------------------+ PFV      Full                                                             +---------+---------------+---------+-----------+----------+-------------------+ POP      Full           No       Yes                                      +---------+---------------+---------+-----------+----------+-------------------+ PTV      Full                                         Not well visualized +---------+---------------+---------+-----------+----------+-------------------+  PERO     Full  Not well visualized +---------+---------------+---------+-----------+----------+-------------------+ Pulsatile flow throughout LLE.    Summary: BILATERAL: - No evidence of deep vein thrombosis seen in the lower extremities, bilaterally. -No evidence of popliteal cyst, bilaterally.   *See table(s) above for measurements and observations. Electronically signed by Jamelle Haring on 06/27/2022 at 8:22:06 PM.    Final    US RENAL  Result Date: 06/27/2022 CLINICAL DATA:  Acute renal insufficiency EXAM: RENAL / URINARY TRACT ULTRASOUND COMPLETE COMPARISON:  06/26/2022 FINDINGS: Right Kidney: Renal measurements: 9.0 x 4.8 x 4.5 cm = volume: 100 mL. Echogenicity within normal limits. No mass or hydronephrosis visualized. Left Kidney: Renal measurements: 9.1 x 4.4 x 5.1 cm = volume: 106 mL. Echogenicity within normal limits. No mass or hydronephrosis visualized. Bladder: Appears normal for degree of bladder distention. Other: None. IMPRESSION: 1. Unremarkable renal ultrasound. Electronically Signed   By: Randa Ngo M.D.   On: 06/27/2022 16:27   DG Chest 1 View  Result Date: 06/27/2022 CLINICAL DATA:  A 66 year old female presents following thoracentesis on the RIGHT post removal of 1.2 L by report. EXAM: CHEST  1 VIEW COMPARISON:  06/26/2022. FINDINGS: EKG leads project over the chest. Trachea midline. Cardiomediastinal contours with cardiac enlargement and engorgement of central pulmonary vasculature. Bibasilar airspace disease with improved aeration at the RIGHT lung base following near complete resolution of pleural fluid. No pneumothorax. On limited assessment there is no acute skeletal finding. IMPRESSION: Improved aeration at the RIGHT lung base following near complete resolution of right effusion. No pneumothorax. Persistent bibasilar airspace disease and marked cardiomegaly. Electronically Signed   By: Zetta Bills M.D.   On: 06/27/2022 14:57     Labs: BMET Recent Labs  Lab 06/26/22 1212 06/27/22 0254 06/27/22 0805 06/27/22 1119 06/28/22 0340 06/29/22 0312  NA 135  --  140  --  139 135  K 3.7  --  3.2*  --  3.7 3.2*  CL 91*  --  91*  --  92* 87*  CO2 34*  --  36*  --  35* 36*  GLUCOSE 151*  --  76  --  125* 138*  BUN 109*  --  115*  --  103* 96*  CREATININE 3.68*  --  3.56*  --  3.66* 3.50*  CALCIUM 9.5  --  9.1  --  9.1 8.7*  PHOS  --  5.1*  --  4.8*  --  3.5   CBC Recent Labs  Lab 06/27/22 0254 06/27/22 0805 06/28/22 0340 06/29/22 0312  WBC 3.7* 3.8* 4.5 4.5  NEUTROABS 2.5  --   --   --   HGB 8.0* 8.3* 8.2* 8.2*  HCT 23.1* 24.0* 24.0* 24.4*  MCV 82.8 82.8 82.5 82.4  PLT 115* 120* 129* 137*    Medications:     aspirin EC  81 mg Oral Daily   enoxaparin (LOVENOX) injection  30 mg Subcutaneous Q24H   hydrocerin   Topical BID   insulin aspart  0-5 Units Subcutaneous QHS   insulin aspart  0-9 Units Subcutaneous TID WC   lactose free nutrition  237 mL Oral TID WC   pantoprazole  40 mg Oral Q1200   potassium chloride  40 mEq Oral BID   sodium chloride flush  3 mL Intravenous Q12H      Gean Quint, MD [facility] 06/29/2022, 8:17 AM \

## 2022-06-29 NOTE — Progress Notes (Signed)
Advanced Heart Failure Rounding Note  PCP-Cardiologist: None   Subjective:    ECHO yesterday: LVEF 60-65%, The interventricular septum is flattened in systole and diastole, consistent w/ RV pressure and volume overload. RV mildly enlarged. RV function normal, severaly elevated PASP, LA severely dilated, RA normal size, mital annular calcification, trivial MR, severe TR  Now on milrinone and IV lasix  out 2.2L weight down 4 pounds overnight (7 pounds total)  Feels better. Less bloated. Denies SOB, orthopnea or PND. Scr 3.66 -> 3.50   Objective:   Weight Range: 90.1 kg Body mass index is 37.53 kg/m.   Vital Signs:   Temp:  [97.6 F (36.4 C)-98.7 F (37.1 C)] 98.7 F (37.1 C) (07/22 0848) Pulse Rate:  [81-87] 87 (07/22 0848) Resp:  [17-18] 18 (07/22 0848) BP: (106-129)/(43-86) 117/43 (07/22 0848) SpO2:  [92 %-98 %] 92 % (07/22 0848) Weight:  [90.1 kg] 90.1 kg (07/22 0500) Last BM Date : 06/28/22  Weight change: Filed Weights   06/27/22 0200 06/28/22 0500 06/29/22 0500  Weight: 93 kg 92 kg 90.1 kg    Intake/Output:   Intake/Output Summary (Last 24 hours) at 06/29/2022 1229 Last data filed at 06/29/2022 0800 Gross per 24 hour  Intake 794.75 ml  Output 2600 ml  Net -1805.25 ml       Physical Exam    General:  Sitting in chair . No resp difficulty HEENT: normal Neck: supple. JVP 9-10 with prominent v waves Carotids 2+ bilat; no bruits. No lymphadenopathy or thryomegaly appreciated. Cor: PMI nondisplaced. Irreg 2/6 TR Lungs: clear Abdomen: obese soft, nontender, nondistended. No hepatosplenomegaly. No bruits or masses. Good bowel sounds. Extremities: no cyanosis, clubbing, rash, 1-2+ edema + UNNA  Neuro: alert & orientedx3, cranial nerves grossly intact. moves all 4 extremities w/o difficulty. Affect pleasant   Telemetry   A fib in 80s Personally reviewed   Labs    CBC Recent Labs    06/27/22 0254 06/27/22 0805 06/28/22 0340 06/29/22 0312  WBC  3.7*   < > 4.5 4.5  NEUTROABS 2.5  --   --   --   HGB 8.0*   < > 8.2* 8.2*  HCT 23.1*   < > 24.0* 24.4*  MCV 82.8   < > 82.5 82.4  PLT 115*   < > 129* 137*   < > = values in this interval not displayed.    Basic Metabolic Panel Recent Labs    06/27/22 1119 06/28/22 0340 06/29/22 0312  NA  --  139 135  K  --  3.7 3.2*  CL  --  92* 87*  CO2  --  35* 36*  GLUCOSE  --  125* 138*  BUN  --  103* 96*  CREATININE  --  3.66* 3.50*  CALCIUM  --  9.1 8.7*  MG 3.0* 2.7*  --   PHOS 4.8*  --  3.5    Liver Function Tests Recent Labs    06/27/22 0254 06/28/22 0340 06/29/22 0312  AST 21 20  --   ALT 16 13  --   ALKPHOS 81 79  --   BILITOT 1.9* 1.9*  --   PROT 7.3 6.9  --   ALBUMIN 3.2* 3.1* 3.0*    No results for input(s): "LIPASE", "AMYLASE" in the last 72 hours. Cardiac Enzymes Recent Labs    06/27/22 0254  CKTOTAL 74     BNP: BNP (last 3 results) Recent Labs    05/28/22 0935 06/19/22 1059 06/26/22  1212  BNP 722.5* 688.3* 999.4*     ProBNP (last 3 results) No results for input(s): "PROBNP" in the last 8760 hours.   D-Dimer No results for input(s): "DDIMER" in the last 72 hours. Hemoglobin A1C Recent Labs    06/27/22 0254  HGBA1C 7.0*    Fasting Lipid Panel No results for input(s): "CHOL", "HDL", "LDLCALC", "TRIG", "CHOLHDL", "LDLDIRECT" in the last 72 hours. Thyroid Function Tests Recent Labs    06/27/22 0254  TSH 0.592     Other results:   Imaging    No results found.   Medications:     Scheduled Medications:  aspirin EC  81 mg Oral Daily   enoxaparin (LOVENOX) injection  30 mg Subcutaneous Q24H   hydrocerin   Topical BID   insulin aspart  0-5 Units Subcutaneous QHS   insulin aspart  0-9 Units Subcutaneous TID WC   lactose free nutrition  237 mL Oral TID WC   pantoprazole  40 mg Oral Q1200   potassium chloride  40 mEq Oral BID   sodium chloride flush  3 mL Intravenous Q12H    Infusions:  sodium chloride     furosemide  (LASIX) 200 mg in dextrose 5 % 100 mL (2 mg/mL) infusion 20 mg/hr (06/29/22 0954)   milrinone 0.125 mcg/kg/min (06/29/22 0958)    PRN Medications: sodium chloride, acetaminophen **OR** acetaminophen, albuterol, HYDROcodone-acetaminophen, lidocaine (PF), polyethylene glycol, sodium chloride flush    Patient Profile   Ms Miler is a 66 y.o. 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 Here for Acute / Chronic diastolic HF with marked volume overload.  Assessment/Plan   1.  Acute / Chronic diastolic HF  - Prominent signs of RV failure.  9/21 echo showed EF 40-45%, global hypokinesis, D-shaped IV septum suggestive of RV pressure/volume overload, RV poorly visualized but appeared normal in size with at least mild systolic dysfunction. Cause of cardiomyopathy is uncertain.  She has history of COVID-19 1/21, cannot rule out viral myocarditis. Chemotherapy-mediated CMP is possible (not sure what chemo she had remotely for breast cancer).  Cardiac MRI showed LV EF 41% with diffuse hypokinesis, RV mildly dilated but with EF 27% and D-shaped septum, nonspecific RV insertion site LGE.  Suspect RV > LV failure. Cardiac MRI is not suggestive of amyloidosis, no M-spike on myeloma panel. We have not ruled out CAD (cath deferred given lack of ischemic CP and CKD). Milrinone started primarily for RV support during admission in 9/21 and titrated off. She had cardiomems placed 1/22. Echo in 1/23 showed improved LV function, EF 55-60%, mild LVH, mild RV enlargement with normal systolic  function, IVC dilated, PASP 68 mmHg. NYHA class III-IIIb symptoms, functional capacity limited by body habitus as well as CHF.  - BNP 999, HsTrop flat 137>>144>>155>>161, Cr 3.56, CXR: Increased right basilar opacity is noted most consistent with subsegmental atelectasis or asymmetric edema with associated pleural effusion, CT abdomen pelvis noted cardiomegaly, marked distention of hepatic veins and suprarenal IVC, large right pleural effusion, abdominal wall edema, liver cirrhosis, trace ascites and hypodensity in pancreatic body. - She appears volume overloaded on exam, Cardiomems last 20 (7/18),  goal 21.  - ECHO this admit: LVEF 60-65%, The interventricular septum is flattened in systole and diastole, consistent w/ RV pressure and volume overload. RV mildly enlarged. RV function normal, severaly elevated PASP, LA severely dilated, RA normal size, mital annular calcification, trivial MR, severe TR - Continue to hold carvedilol  - Continue to hold hydralazine 37.5 mg tid + Imdur 60 mg daily.  - GFR 13, not a candidate for SGLT2i. - Volume status improving Continue milrinone and IV lasix. Continue UNNA - No ARNI/spironolactone with elevated creatinine.  - BLE VAS [place] 7/20: (-) DVT - Daily weights and Strict I&O - Daily BMET. Bicarb up slightly. Will follow. No diamox with CKD IV   2. Chronic hypoxemic respiratory failure: OHS/OSA.  She was using home oxygen prior to COVID-19 PNA.  - Not currently wearing CPAP at home, has hospital one at bedside but refused to use overnight d/t sudden headache per patient. - Stable on O2 via    3. Atrial fibrillation: Chronic, all ECGs since 7/21 show atrial fibrillation.  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 Rate is controlled. No overt bleeding.  - She cannot be cardioverted at this point as she cannot be chronically anticoagulated. - Evaluated by Dr. Quentin Ore for [place] but not a suitable candidate given multiple  severe medical comorbidities and risks associated with general anesthesia. - Stable   4. HTN - stable, continue current regimen   5. Hx of GI bleeding: Recurrent, from small bowel AVMs. Unable to be anticoagulated. No recent overt bleeding.  - Stable hgb recently.    6. H/o DVT/PE: Provoked (post-surgery). No longer on anticoagulation due to h/o significant GIB.  - Has IVC filter.    7. AKI on CKD Stage IV.  - Daily BMET, Cr. 3.66 -> 3.5  today, baseline 2.6 - Denies NSAID use - Renal US 06/27/22: Unremarkable renal ultrasound. - not HD candidate per Renal   8. Hypokalemia - K 3.2, stable - Daily BMET   9. Right pleural effusion - s/p thoracentesis (last 08/31/20) (d/t CHF)  - CT scan this admit with large right pleural effusion - thoracentesis 06/27/22- Yielded 1.2 L of hazy amber fluid, cultures NGTD   10. DM II - continue SSI - Hgb A1c 7   11. Anemia - Suspect due to chronic low grade GI bleeding (small bowel AVMs). - Daily CBC - Hgb today 8.2. Baseline 8-9 - Iron profile stable: tSat 22, ferritin 245.    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 No focal abnormality on this limited unenhanced exam. No evidence of cholelithiasis or cholecystitis. - Denies ETOH use - Hep A,B,C (-) - AST/ALT stable - Ammonia 44, tBili 1.9 - GI saw and signed off: they would not do further serologic evaluation other than those studies pending i.e. hepatitis serologies and ANA. Eventually would consider EGD.   13. Thrombocytopenia  - chroni - monitor for bleeding  14. NSVT - Keep K > 4.0 Mg > 2,0  - Daily BMET  15. Enlarged thyroid - Thyroid US 06/27/20: 1.5 cm left mid thyroid TR 3 nodule - TSH was WNL .592  Length of Stay: 3 Glori Bickers, MD  12:34 PM  06/29/2022, 12:29 PM  Advanced Heart Failure Team Pager [phone] (M-F; 7a - 5p)  Please contact [facility] for night-coverage after hours (5p  -7a ) and weekends on [email or web address]

## 2022-06-29 NOTE — Progress Notes (Signed)
RT went to place pt on CPAP for night time use, pt refused at this time. Will continue to monitor.

## 2022-06-30 DIAGNOSIS — I5033 Acute on chronic diastolic (congestive) heart failure: Secondary | ICD-10-CM | POA: Diagnosis not present

## 2022-06-30 LAB — FERRITIN: Ferritin: 233 ng/mL (ref 11–307)

## 2022-06-30 LAB — GLUCOSE, CAPILLARY
Glucose-Capillary: 120 mg/dL — ABNORMAL HIGH (ref 70–99)
Glucose-Capillary: 166 mg/dL — ABNORMAL HIGH (ref 70–99)
Glucose-Capillary: 154 mg/dL — ABNORMAL HIGH (ref 70–99)
Glucose-Capillary: 253 mg/dL — ABNORMAL HIGH (ref 70–99)

## 2022-06-30 LAB — IRON AND TIBC
Iron: 52 ug/dL (ref 28–170)
Saturation Ratios: 23 % (ref 10.4–31.8)
TIBC: 228 ug/dL — ABNORMAL LOW (ref 250–450)
UIBC: 176 ug/dL

## 2022-06-30 LAB — BASIC METABOLIC PANEL
Anion gap: 11 (ref 5–15)
BUN: 87 mg/dL — ABNORMAL HIGH (ref 8–23)
CO2: 40 mmol/L — ABNORMAL HIGH (ref 22–32)
Calcium: 9.1 mg/dL (ref 8.9–10.3)
Chloride: 88 mmol/L — ABNORMAL LOW (ref 98–111)
Creatinine, Ser: 3.18 mg/dL — ABNORMAL HIGH (ref 0.44–1.00)
GFR, Estimated: 15 mL/min — ABNORMAL LOW (ref >60)
Glucose, Bld: 114 mg/dL — ABNORMAL HIGH (ref 70–99)
Potassium: 3.9 mmol/L (ref 3.5–5.1)
Sodium: 139 mmol/L (ref 135–145)

## 2022-06-30 LAB — CBC
HCT: 25.5 % — ABNORMAL LOW (ref 36.0–46.0)
Hemoglobin: 8.5 g/dL — ABNORMAL LOW (ref 12.0–15.0)
MCH: 28.1 pg (ref 26.0–34.0)
MCHC: 33.3 g/dL (ref 30.0–36.0)
MCV: 84.4 fL (ref 80.0–100.0)
Platelets: 139 10*3/uL — ABNORMAL LOW (ref 150–400)
RBC: 3.02 MIL/uL — ABNORMAL LOW (ref 3.87–5.11)
RDW: 13.9 % (ref 11.5–15.5)
WBC: 4.7 10*3/uL (ref 4.0–10.5)
nRBC: 0 % (ref 0.0–0.2)

## 2022-06-30 NOTE — Progress Notes (Signed)
Mobility Specialist Progress Note    06/30/22 1537  Mobility  Activity Ambulated with assistance in hallway  Level of Assistance Contact guard assist, steadying assist  Assistive Device Four wheel walker  Distance Ambulated (ft) 150 ft (90+60)  Activity Response Tolerated well  $Mobility charge 1 Mobility   Pre-Mobility: 78 HR During Mobility: 99 HR Post-Mobility: 78 HR  Pt received in bed and agreeable. No complaints on walk. On 2LO2. Encouraged pursed lip breathing. Took x1 short rest break leaning down onto the rollator. Returned to chair with call bell in reach.    Hildred Alamin Mobility Specialist

## 2022-06-30 NOTE — Progress Notes (Signed)
PROGRESS NOTE    Rita [name]  ZOX:[id number] DOB: [birth year] DOA: 06/26/2022 PCP: [name], MD  66/F with history of chronic systolic CHF, EF recovered, CKD 4, history of chronic GI bleed/SB AVMs, paroxysmal A-fib not on anticoagulation, type 2 diabetes mellitus, history of DVT/PE, IVC filter, chronic respiratory failure on 2 L home O2, obesity presented to the ED with worsening edema, abdominal distention.  Seen in clinic a week ago, torsemide dose increased -CT abdomen pelvis noted cardiomegaly, marked distention of hepatic veins and suprarenal IVC, large right pleural effusion, abdominal wall edema, liver cirrhosis, trace ascites and hypodensity in pancreatic body  -7/20 started Lasix gtt, R Thora -1.2L drained -7/21 starting milrinone, advanced heart failure team and nephrology following  Subjective: -Feels better, breathing continues to improve  Assessment and Plan:  Acute on chronic systolic CHF Large right pleural effusion -BiV failure, predominantly right heart failure -EF recovered to 55%, on echo 1/23 -Repeat echo with preserved EF, severe PAH and severe TR -Advanced heart failure team following, now on Lasix gtt, milrinone -Diuresing, 4.9 L negative -Hold Coreg and hydralazine -GDMT limited by AKI/CKD -s/p thoracentesis in IR , 1.2 L drained, continue UNNA boots -GDMT limited by CKD 4/AKI -PT/OT  AKI on CKD 4 -Baseline creatinine around 2.6, peaked at 3.6, now 3.1 -Suspect this is cardiorenal, diuretics as above, avoid hypotension -Advanced heart failure team consulted, followed by Dr.Foster -Continue milrinone, appreciate nephrology input, not a good HD candidate -Diuretics as above  Chronic anemia -Secondary to CKD 4 and chronic GI bleed, stable, -Anemia panel more suggestive of chronic disease  Cirrhosis -Secondary to right heart failure, no history of chronic hepatitis or alcoholism -Diuretics as above, avoid Aldactone with CKD 4  Chronic  hypoxic respiratory failure -Reportedly CPAP was taken away from her home recently, -Continue CPAP nightly, TOC consult for home CPAP  Chronic A-fib -Rate controlled, Coreg on hold at this time, not on anticoagulation due to recurrent GI bleeds from small bowel AVMs  History of recurrent GI bleeds -History of small bowel AVMs, hemoglobin stable at this time, monitor  History of DVT/PE -Following orthopedic surgery, has IVC filter  Type 2 diabetes mellitus -CBGs are stable, hemoglobin A1c was 7.0  DVT prophylaxis:  Lovenox Code Status: Full code Family Communication: Discussed with patient detail, no family at bedside Disposition Plan: Anticipate long hospitalization  Consultants: Cardiology, nephrology   Procedures:   Antimicrobials:    Objective: Vitals:   06/29/22 1940 06/30/22 0600 06/30/22 0625 06/30/22 1019  BP: 102/65  122/64 (!) 113/57  Pulse: 86  66 69  Resp: 18   18  Temp: 98 F (36.7 C) 98.6 F (37 C)  98.4 F (36.9 C)  TempSrc: Oral Oral  Oral  SpO2: 94%  96% 98%  Weight:  87 kg    Height:        Intake/Output Summary (Last 24 hours) at 06/30/2022 1203 Last data filed at 06/30/2022 1000 Gross per 24 hour  Intake 240 ml  Output 1300 ml  Net -1060 ml   Filed Weights   06/28/22 0500 06/29/22 0500 06/30/22 0600  Weight: 92 kg 90.1 kg 87 kg    Examination:  General exam: Gen: obese, chronically ill female, Awake, Alert, Oriented X 3,  HEENT: + JVD Lungs: decreased BS at bases CVS: S1S2/Irregular rhythm Abd: soft, Non tender, non distended, BS present Extremities: 1-2+ edema, Unna boots on  Skin: No rashes Psychiatry:  Mood & affect appropriate.  Data Reviewed:   CBC: Recent Labs  Lab 06/27/22 0254 06/27/22 0805 06/28/22 0340 06/29/22 0312 06/30/22 0648  WBC 3.7* 3.8* 4.5 4.5 4.7  NEUTROABS 2.5  --   --   --   --   HGB 8.0* 8.3* 8.2* 8.2* 8.5*  HCT 23.1* 24.0* 24.0* 24.4* 25.5*  MCV 82.8 82.8 82.5 82.4 84.4  PLT 115* 120* 129*  137* 694*   Basic Metabolic Panel: Recent Labs  Lab 06/26/22 1212 06/27/22 0254 06/27/22 0805 06/27/22 1119 06/28/22 0340 06/29/22 0312 06/30/22 0648  NA 135  --  140  --  139 135 139  K 3.7  --  3.2*  --  3.7 3.2* 3.9  CL 91*  --  91*  --  92* 87* 88*  CO2 34*  --  36*  --  35* 36* 40*  GLUCOSE 151*  --  76  --  125* 138* 114*  BUN 109*  --  115*  --  103* 96* 87*  CREATININE 3.68*  --  3.56*  --  3.66* 3.50* 3.18*  CALCIUM 9.5  --  9.1  --  9.1 8.7* 9.1  MG  --  2.8*  --  3.0* 2.7*  --   --   PHOS  --  5.1*  --  4.8*  --  3.5  --    GFR: Estimated Creatinine Clearance: 17 mL/min (A) (by C-G formula based on SCr of 3.18 mg/dL (H)). Liver Function Tests: Recent Labs  Lab 06/27/22 0254 06/28/22 0340 06/29/22 0312  AST 21 20  --   ALT 16 13  --   ALKPHOS 81 79  --   BILITOT 1.9* 1.9*  --   PROT 7.3 6.9  --   ALBUMIN 3.2* 3.1* 3.0*   No results for input(s): "LIPASE", "AMYLASE" in the last 168 hours. Recent Labs  Lab 06/27/22 0254  AMMONIA 44*   Coagulation Profile: Recent Labs  Lab 06/27/22 0254  INR 1.4*   Cardiac Enzymes: Recent Labs  Lab 06/27/22 0254  CKTOTAL 74   BNP (last 3 results) No results for input(s): "PROBNP" in the last 8760 hours. HbA1C: No results for input(s): "HGBA1C" in the last 72 hours.  CBG: Recent Labs  Lab 06/29/22 0623 06/29/22 1205 06/29/22 1638 06/29/22 2109 06/30/22 0625  GLUCAP 131* 241* 264* 189* 120*   Lipid Profile: No results for input(s): "CHOL", "HDL", "LDLCALC", "TRIG", "CHOLHDL", "LDLDIRECT" in the last 72 hours. Thyroid Function Tests: No results for input(s): "TSH", "T4TOTAL", "FREET4", "T3FREE", "THYROIDAB" in the last 72 hours.  Anemia Panel: Recent Labs    06/30/22 0645  FERRITIN 233  TIBC 228*  IRON 52   Urine analysis:    Component Value Date/Time   COLORURINE STRAW (A) 06/26/2022 2036   APPEARANCEUR CLEAR 06/26/2022 2036   LABSPEC 1.006 06/26/2022 2036   PHURINE 5.0 06/26/2022 2036    GLUCOSEU NEGATIVE 06/26/2022 2036   HGBUR NEGATIVE 06/26/2022 2036   BILIRUBINUR NEGATIVE 06/26/2022 2036   KETONESUR NEGATIVE 06/26/2022 2036   PROTEINUR NEGATIVE 06/26/2022 2036   NITRITE NEGATIVE 06/26/2022 2036   LEUKOCYTESUR NEGATIVE 06/26/2022 2036   Sepsis Labs: @LABRCNTIP (procalcitonin:4,lacticidven:4)  ) Recent Results (from the past 240 hour(s))  Culture, body fluid w Gram Stain-bottle     Status: None (Preliminary result)   Collection Time: 06/27/22  3:02 PM   Specimen: Pleura  Result Value Ref Range Status   Specimen Description PLEURAL  Final   Special Requests RIGHT LUNG  Final   Culture  Final    NO GROWTH 3 DAYS Performed at [facility], [place] [address], [place], [place] [address]    Report Status PENDING  Incomplete  Gram stain     Status: None   Collection Time: 06/27/22  3:02 PM   Specimen: Fluid  Result Value Ref Range Status   Specimen Description FLUID  Final   Special Requests RIGHT LUNG  Final   Gram Stain   Final    WBC PRESENT, PREDOMINANTLY MONONUCLEAR NO ORGANISMS SEEN CYTOSPIN SMEAR Performed at [facility], [place] [address] [place],  [address]    Report Status 06/27/2022 FINAL  Final     Radiology Studies: No results found.   Scheduled Meds:  aspirin EC  81 mg Oral Daily   enoxaparin (LOVENOX) injection  30 mg Subcutaneous Q24H   hydrocerin   Topical BID   insulin aspart  0-5 Units Subcutaneous QHS   insulin aspart  0-9 Units Subcutaneous TID WC   lactose free nutrition  237 mL Oral TID WC   pantoprazole  40 mg Oral Q1200   sodium chloride flush  3 mL Intravenous Q12H   Continuous Infusions:  sodium chloride     furosemide (LASIX) 200 mg in dextrose 5 % 100 mL (2 mg/mL) infusion 20 mg/hr (06/30/22 0622)   milrinone 0.125 mcg/kg/min (06/29/22 0958)     LOS: 4 days    Time spent: 23min    Domenic Polite, MD [facility]   06/30/2022, 12:03 PM

## 2022-06-30 NOTE — Progress Notes (Signed)
Advanced Heart Failure Rounding Note  PCP-Cardiologist: None   Subjective:    ECHO yesterday: LVEF 60-65%, The interventricular septum is flattened in systole and diastole, consistent w/ RV pressure and volume overload. RV mildly enlarged. RV function normal, severaly elevated PASP, LA severely dilated, RA normal size, mital annular calcification, trivial MR, severe TR  Now on milrinone and IV lasix  out 1.2L weight down another 7 pounds overnight (14 pounds total)  Feels lightheaded this am. Denies SOB, orthopnea or PND. Scr 3.66 -> 3.50  -> 3.18  Objective:   Weight Range: 87 kg Body mass index is 36.24 kg/m.   Vital Signs:   Temp:  [98 F (36.7 C)-98.7 F (37.1 C)] 98.6 F (37 C) (07/23 0600) Pulse Rate:  [66-87] 66 (07/23 0625) Resp:  [18] 18 (07/22 1940) BP: (102-122)/(43-65) 122/64 (07/23 0625) SpO2:  [92 %-96 %] 96 % (07/23 0625) Weight:  [87 kg] 87 kg (07/23 0600) Last BM Date : 06/28/22  Weight change: Filed Weights   06/28/22 0500 06/29/22 0500 06/30/22 0600  Weight: 92 kg 90.1 kg 87 kg    Intake/Output:   Intake/Output Summary (Last 24 hours) at 06/30/2022 0830 Last data filed at 06/30/2022 3299 Gross per 24 hour  Intake --  Output 800 ml  Net -800 ml       Physical Exam    General:  Sitting in chair. No resp difficulty HEENT: normal Neck: supple. JVP flat Carotids 2+ bilat; no bruits. No lymphadenopathy or thryomegaly appreciated. Cor: PMI nondisplaced. Irregular rate & rhythm. No rubs, gallops or murmurs. Lungs: clear Abdomen: soft, nontender, nondistended. No hepatosplenomegaly. No bruits or masses. Good bowel sounds. Extremities: no cyanosis, clubbing, rash, tr edema  + UNNA Neuro: alert & orientedx3, cranial nerves grossly intact. moves all 4 extremities w/o difficulty. Affect pleasant    Telemetry   A fib in 60-70s Personally reviewed   Labs    CBC Recent Labs    06/29/22 0312 06/30/22 0648  WBC 4.5 4.7  HGB 8.2* 8.5*   HCT 24.4* 25.5*  MCV 82.4 84.4  PLT 137* 139*    Basic Metabolic Panel Recent Labs    06/27/22 1119 06/28/22 0340 06/28/22 0340 06/29/22 0312 06/30/22 0648  NA  --  139   < > 135 139  K  --  3.7   < > 3.2* 3.9  CL  --  92*   < > 87* 88*  CO2  --  35*   < > 36* 40*  GLUCOSE  --  125*   < > 138* 114*  BUN  --  103*   < > 96* 87*  CREATININE  --  3.66*   < > 3.50* 3.18*  CALCIUM  --  9.1   < > 8.7* 9.1  MG 3.0* 2.7*  --   --   --   PHOS 4.8*  --   --  3.5  --    < > = values in this interval not displayed.    Liver Function Tests Recent Labs    06/28/22 0340 06/29/22 0312  AST 20  --   ALT 13  --   ALKPHOS 79  --   BILITOT 1.9*  --   PROT 6.9  --   ALBUMIN 3.1* 3.0*    No results for input(s): "LIPASE", "AMYLASE" in the last 72 hours. Cardiac Enzymes No results for input(s): "CKTOTAL", "CKMB", "CKMBINDEX", "TROPONINI" in the last 72 hours.   BNP: BNP (last 3  results) Recent Labs    05/28/22 0935 06/19/22 1059 06/26/22 1212  BNP 722.5* 688.3* 999.4*     ProBNP (last 3 results) No results for input(s): "PROBNP" in the last 8760 hours.   D-Dimer No results for input(s): "DDIMER" in the last 72 hours. Hemoglobin A1C No results for input(s): "HGBA1C" in the last 72 hours.  Fasting Lipid Panel No results for input(s): "CHOL", "HDL", "LDLCALC", "TRIG", "CHOLHDL", "LDLDIRECT" in the last 72 hours. Thyroid Function Tests No results for input(s): "TSH", "T4TOTAL", "T3FREE", "THYROIDAB" in the last 72 hours.  Invalid input(s): "FREET3"   Other results:   Imaging    No results found.   Medications:     Scheduled Medications:  aspirin EC  81 mg Oral Daily   enoxaparin (LOVENOX) injection  30 mg Subcutaneous Q24H   hydrocerin   Topical BID   insulin aspart  0-5 Units Subcutaneous QHS   insulin aspart  0-9 Units Subcutaneous TID WC   lactose free nutrition  237 mL Oral TID WC   pantoprazole  40 mg Oral Q1200   sodium chloride flush  3 mL  Intravenous Q12H    Infusions:  sodium chloride     furosemide (LASIX) 200 mg in dextrose 5 % 100 mL (2 mg/mL) infusion 20 mg/hr (06/30/22 0622)   milrinone 0.125 mcg/kg/min (06/29/22 0958)    PRN Medications: sodium chloride, acetaminophen **OR** acetaminophen, albuterol, bisacodyl, bisacodyl, HYDROcodone-acetaminophen, lidocaine (PF), polyethylene glycol, sodium chloride flush    Patient Profile   Ms Dettmann is a 66 y.o. 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 Here for Acute / Chronic diastolic HF with marked volume overload.  Assessment/Plan   1.  Acute / Chronic diastolic HF  - Prominent signs of RV failure.  9/21 echo showed EF 40-45%, global hypokinesis, D-shaped IV septum suggestive of RV pressure/volume overload, RV poorly visualized but appeared normal in size with at least mild systolic dysfunction. Cause of cardiomyopathy is uncertain.  She has history of COVID-19 1/21, cannot rule out viral myocarditis. Chemotherapy-mediated CMP is possible (not sure what chemo she had remotely for breast cancer).  Cardiac MRI showed LV EF 41% with diffuse hypokinesis, RV mildly dilated but with EF 27% and D-shaped septum, nonspecific RV insertion site LGE.  Suspect RV > LV failure. Cardiac MRI is not suggestive of amyloidosis, no M-spike on myeloma panel. We have not ruled out CAD (cath deferred given lack of ischemic CP and CKD). Milrinone started primarily for RV support during admission in 9/21 and titrated off. She had cardiomems placed 1/22. Echo in 1/23  showed improved LV function, EF 55-60%, mild LVH, mild RV enlargement with normal systolic function, IVC dilated, PASP 68 mmHg. NYHA class III-IIIb symptoms, functional capacity limited by body habitus as well as CHF.  - BNP 999, HsTrop flat 137>>144>>155>>161, Cr 3.56, CXR: Increased right basilar opacity is noted most consistent with subsegmental atelectasis or asymmetric edema with associated pleural effusion, CT abdomen pelvis noted cardiomegaly, marked distention of hepatic veins and suprarenal IVC, large right pleural effusion, abdominal wall edema, liver cirrhosis, trace ascites and hypodensity in pancreatic body. - Admitted with marked  edema. Cardiomems last 20 (7/18), goal 21 c/w R>L HF - ECHO this admit: LVEF 60-65%, The interventricular septum is flattened in systole and diastole, consistent w/ RV pressure and volume overload. RV mildly enlarged. RV function normal, severaly elevated PASP, LA severely dilated, RA normal size, mital annular calcification, trivial MR, severe TR - Continue to hold carvedilol  - Continue to hold hydralazine 37.5 mg tid + Imdur 60 mg daily.  - GFR < 20, not a candidate for SGLT2i. - Volume status much improved. She looks dry. Stop lasix gtt. Continue milrinone for today.  - No ARNI/spironolactone with elevated creatinine.  - BLE VAS [place] 7/20: (-) DVT - Daily weights and Strict I&O - Follow BMET   2. Chronic hypoxemic respiratory failure: OHS/OSA.  She was using home oxygen prior to COVID-19 PNA.  - Not currently wearing CPAP at home, has hospital one at bedside but refused to use overnight d/t sudden headache per patient. - Stable on O2 via [place]   3. Atrial fibrillation: Chronic, all ECGs since 7/21 show atrial fibrillation.  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 Rate is controlled. No overt bleeding.  - She cannot be cardioverted at this point as she cannot be chronically anticoagulated. - Evaluated by Dr. Quentin Ore  for [place] but not a suitable candidate given multiple severe medical comorbidities and risks associated with general anesthesia. - Stable   4. HTN - stable   5. Hx of GI bleeding: Recurrent, from small bowel AVMs. Unable to be anticoagulated. No recent overt bleeding.  - Stable hgb recently.    6. H/o DVT/PE: Provoked (post-surgery). No longer on anticoagulation due to h/o significant GIB.  - Has IVC filter.    7. AKI on CKD Stage IV.  - Daily BMET, Cr. 3.66 -> 3.5 -> 3.18  today, baseline 2.6 - Denies NSAID use - Renal US 06/27/22: Unremarkable renal ultrasound. - not HD candidate per Renal - stop lasix today   8. Hypokalemia - K 3.9,  - Daily BMET - supp  as needed    9. Right pleural effusion - s/p thoracentesis (last 08/31/20) (d/t CHF)  - CT scan this admit with large right pleural effusion - thoracentesis 06/27/22- Yielded 1.2 L of hazy amber fluid, cultures NGTD   10. DM II - continue SSI - Hgb A1c 7   11. Anemia - Suspect due to chronic low grade GI bleeding (small bowel AVMs). - Daily CBC - Hgb today 8.5. Baseline 8-9 - Iron profile stable: tSat 22, ferritin 245.    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 No focal abnormality on this limited unenhanced exam. No evidence of cholelithiasis or cholecystitis. - Denies ETOH use - Hep A,B,C (-) - AST/ALT stable - Ammonia 44, tBili 1.9 - GI saw and signed off: they would not do further serologic evaluation other than those studies pending i.e. hepatitis serologies and ANA. Eventually would consider EGD.   13. Thrombocytopenia  - chronic - monitor for bleeding  14. NSVT - Keep K > 4.0 Mg > 2,0  - Daily BMET  15. Enlarged thyroid - Thyroid US 06/27/20: 1.5 cm left mid thyroid TR 3 nodule - TSH was WNL .592  Length of Stay: 4 Glori Bickers, MD  8:30 AM  06/30/2022, 8:30 AM  Advanced Heart Failure Team Pager [phone] (M-F; 7a -  5p)  Please contact [facility] for night-coverage after hours (5p -  7a ) and weekends on [email or web address]

## 2022-06-30 NOTE — Progress Notes (Signed)
Pt refuses CPAP 

## 2022-06-30 NOTE — Progress Notes (Signed)
[facility] Progress Note    Assessment/ Plan:    AKI/CKD stage IV - presumably due to cardiorenal syndrome.  Continue with IV diuresis as per cardiology and follow UOP and daily Scr levels.  No urgent indication for dialysis patients at this time. Given advanced RV failure, would not be an ideal long term dialysis candidate as risks>benefits, discussed this briefly with patient and she does agree--ongoing discussion. Advised her to think about this and discuss w/ family, discussed this again on 7/22 and again today. Would recommend a palliative consultation as well to address GOC--patient is okay with this--will defer to primary service  on lasix gtt and milrinone per HF. Cr continues to improve. Having some evidence of contraction alkalosis on labs, may need to back off on lasix gtt soon Renal dose meds for eGFR <15.   Avoid nephrotoxic agents such as IV contrast, NSAIDs/Cox-II I's, phosphate containing bowel preps (FLEETS). 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 Chronic hypoxemic respiratory failure - due to OHS/OSA.  Cpap in hospital however patient continues to refuse Atrial fibrillation -  chronic.  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 HTN - stable H/o DVT/PE - s/p IVC Right pleural effusion - s/p right thoracentesis 7/21 with IR and removed 1.2 Liters Cardiac cirrhosis - GI consulted, signed off. 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 She is thrilled about having a good bowel movement today. She reports she overall feels better, feels as if swelling and breathing continue to improve. No other complaints.   Objective:   BP 122/64   Pulse 66   Temp 98.6 F (37 C) (Oral)   Resp 18   Ht $R'5\' 1"'uo$  (1.549 m)   Wt 87  kg   SpO2 96%   BMI 36.24 kg/m   Intake/Output Summary (Last 24 hours) at 06/30/2022 0835 Last data filed at 06/30/2022 0622 Gross per 24 hour  Intake --  Output 800 ml  Net -800 ml   Weight change: -3.1 kg  Physical Exam: Gen:ill appearing, NAD, sitting up in bed eating breakfast CVS:RRR Resp:diminished air entry bibasilar, normal WOB JOA:CZYSA, soft Ext: no sig edema (legs wrapped) Neuro: awake, alert  Imaging: No results found.  Labs: BMET Recent Labs  Lab 06/26/22 1212 06/27/22 0254 06/27/22 0805 06/27/22 1119 06/28/22 0340 06/29/22 0312 06/30/22 0648  NA 135  --  140  --  139 135 139  K 3.7  --  3.2*  --  3.7 3.2* 3.9  CL 91*  --  91*  --  92* 87* 88*  CO2 34*  --  36*  --  35* 36* 40*  GLUCOSE 151*  --  76  --  125* 138* 114*  BUN 109*  --  115*  --  103* 96* 87*  CREATININE 3.68*  --  3.56*  --  3.66* 3.50* 3.18*  CALCIUM 9.5  --  9.1  --  9.1 8.7* 9.1  PHOS  --  5.1*  --  4.8*  --  3.5  --    CBC Recent Labs  Lab 06/27/22 0254 06/27/22 0805 06/28/22 0340 06/29/22 0312 06/30/22 0648  WBC 3.7* 3.8* 4.5 4.5 4.7  NEUTROABS 2.5  --   --   --   --   HGB 8.0*  8.3* 8.2* 8.2* 8.5*  HCT 23.1* 24.0* 24.0* 24.4* 25.5*  MCV 82.8 82.8 82.5 82.4 84.4  PLT 115* 120* 129* 137* 139*    Medications:     aspirin EC  81 mg Oral Daily   enoxaparin (LOVENOX) injection  30 mg Subcutaneous Q24H   hydrocerin   Topical BID   insulin aspart  0-5 Units Subcutaneous QHS   insulin aspart  0-9 Units Subcutaneous TID WC   lactose free nutrition  237 mL Oral TID WC   pantoprazole  40 mg Oral Q1200   sodium chloride flush  3 mL Intravenous Q12H      Gean Quint, MD [facility] 06/30/2022, 8:35 AM \

## 2022-07-01 DIAGNOSIS — J9621 Acute and chronic respiratory failure with hypoxia: Secondary | ICD-10-CM | POA: Diagnosis not present

## 2022-07-01 DIAGNOSIS — I5033 Acute on chronic diastolic (congestive) heart failure: Secondary | ICD-10-CM | POA: Diagnosis not present

## 2022-07-01 LAB — ANA W/REFLEX IF POSITIVE: Anti Nuclear Antibody (ANA): POSITIVE — AB

## 2022-07-01 LAB — GLUCOSE, CAPILLARY
Glucose-Capillary: 108 mg/dL — ABNORMAL HIGH (ref 70–99)
Glucose-Capillary: 110 mg/dL — ABNORMAL HIGH (ref 70–99)
Glucose-Capillary: 148 mg/dL — ABNORMAL HIGH (ref 70–99)
Glucose-Capillary: 137 mg/dL — ABNORMAL HIGH (ref 70–99)
Glucose-Capillary: 136 mg/dL — ABNORMAL HIGH (ref 70–99)

## 2022-07-01 LAB — BASIC METABOLIC PANEL
BUN: 83 mg/dL — ABNORMAL HIGH (ref 8–23)
CO2: >45 mmol/L — ABNORMAL HIGH (ref 22–32)
Calcium: 9.2 mg/dL (ref 8.9–10.3)
Chloride: 84 mmol/L — ABNORMAL LOW (ref 98–111)
Creatinine, Ser: 3.36 mg/dL — ABNORMAL HIGH (ref 0.44–1.00)
GFR, Estimated: 14 mL/min — ABNORMAL LOW (ref >60)
Glucose, Bld: 103 mg/dL — ABNORMAL HIGH (ref 70–99)
Potassium: 3.5 mmol/L (ref 3.5–5.1)
Sodium: 140 mmol/L (ref 135–145)

## 2022-07-01 LAB — ENA+DNA/DS+ANTICH+CENTRO+JO...
Anti JO-1: <0.2 AI (ref 0.0–0.9)
Centromere Ab Screen: <0.2 AI (ref 0.0–0.9)
Chromatin Ab SerPl-aCnc: <0.2 AI (ref 0.0–0.9)
ENA SM Ab Ser-aCnc: <0.2 AI (ref 0.0–0.9)
Ribonucleic Protein: 0.7 AI (ref 0.0–0.9)
SSA (Ro) (ENA) Antibody, IgG: <0.2 AI (ref 0.0–0.9)
SSB (La) (ENA) Antibody, IgG: 1.7 AI — ABNORMAL HIGH (ref 0.0–0.9)
Scleroderma (Scl-70) (ENA) Antibody, IgG: <0.2 AI (ref 0.0–0.9)
ds DNA Ab: <1 IU/mL (ref 0–9)

## 2022-07-01 LAB — CBC
HCT: 24.5 % — ABNORMAL LOW (ref 36.0–46.0)
Hemoglobin: 8.2 g/dL — ABNORMAL LOW (ref 12.0–15.0)
MCH: 28.5 pg (ref 26.0–34.0)
MCHC: 33.5 g/dL (ref 30.0–36.0)
MCV: 85.1 fL (ref 80.0–100.0)
Platelets: 148 10*3/uL — ABNORMAL LOW (ref 150–400)
RBC: 2.88 MIL/uL — ABNORMAL LOW (ref 3.87–5.11)
RDW: 14.3 % (ref 11.5–15.5)
WBC: 5.1 10*3/uL (ref 4.0–10.5)
nRBC: 0 % (ref 0.0–0.2)

## 2022-07-01 IMAGING — MR MR CARD MORPHOLOGY WO/W CM
44 of 48 series · 44 of 48 positions shown · IV contrast (gadavist)
Comparison: none

CLINICAL DATA: Cardiomyopathy of uncertain etiology.

EXAM:
CARDIAC MRI
TECHNIQUE: The patient was scanned on a 1.5 Tesla GE magnet. A dedicated
cardiac coil was used. Functional imaging was done using [employer]
sequences. [DATE], and 4 chamber views were done to assess for RWMA's.
Modified Ilian rule using a short axis stack was used to
calculate an ejection fraction on a dedicated work station using
Circle software. The patient received 10 cc of Gadavist. After 10
minutes inversion recovery sequences were used to assess for
infiltration and scar tissue.
CONTRAST:  Gadavist 10 cc

[Series 4: t2_haste_db_tra_bh · axial · 8.0mm · 1.56mm/px · 1 of 20 slices shown]
[im 1/20]
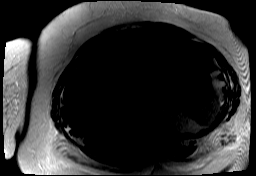

[Series 9: bSSFP · sagittal · 8.0mm · 2.01mm/px · 1 of 25 slices shown (1 of 20)]
[im 1/25]
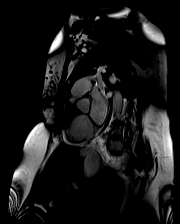

[Series 10: bSSFP · sagittal · 8.0mm · 2.01mm/px · 1 of 25 slices shown (2 of 20)]
[im 1/25]
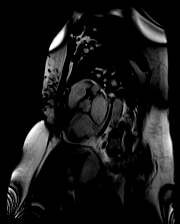

[Series 11: bSSFP · sagittal · 8.0mm · 2.01mm/px · 1 of 25 slices shown (3 of 20)]
[im 1/25]
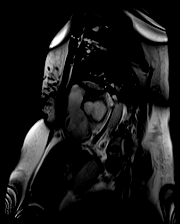

[Series 12: bSSFP · sagittal · 8.0mm · 2.01mm/px · 1 of 25 slices shown (4 of 20)]
[im 1/25]
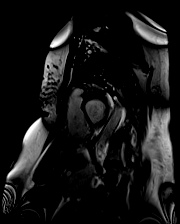

[Series 13: bSSFP · sagittal · 8.0mm · 2.01mm/px · 1 of 25 slices shown (5 of 20)]
[im 1/25]
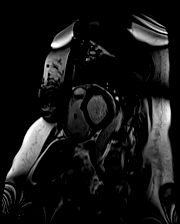

[Series 14: bSSFP · sagittal · 8.0mm · 2.01mm/px · 1 of 25 slices shown (6 of 20)]
[im 1/25]
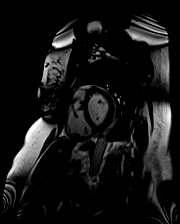

[Series 15: bSSFP · sagittal · 8.0mm · 2.01mm/px · 1 of 25 slices shown (7 of 20)]
[im 1/25]
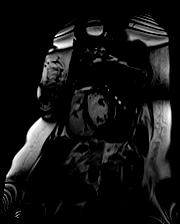

[Series 16: bSSFP · sagittal · 8.0mm · 2.01mm/px · 1 of 25 slices shown (8 of 20)]
[im 1/25]
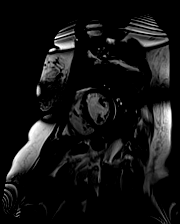

[Series 17: bSSFP · sagittal · 8.0mm · 2.01mm/px · 1 of 25 slices shown (9 of 20)]
[im 1/25]
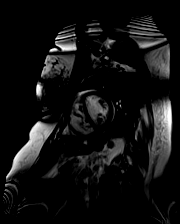

[Series 18: bSSFP · sagittal · 8.0mm · 2.01mm/px · 1 of 25 slices shown (10 of 20)]
[im 1/25]
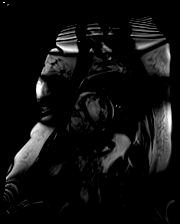

[Series 19: bSSFP · sagittal · 8.0mm · 2.01mm/px · 1 of 25 slices shown (11 of 20)]
[im 1/25]
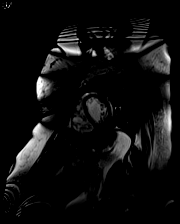

[Series 20: bSSFP · sagittal · 8.0mm · 2.01mm/px · 1 of 25 slices shown (12 of 20)]
[im 1/25]
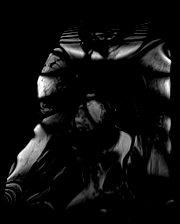

[Series 21: bSSFP · sagittal · 8.0mm · 2.01mm/px · 1 of 25 slices shown (13 of 20)]
[im 1/25]
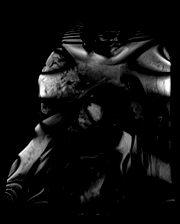

[Series 22: bSSFP · sagittal · 8.0mm · 2.01mm/px · 1 of 25 slices shown (14 of 20)]
[im 1/25]
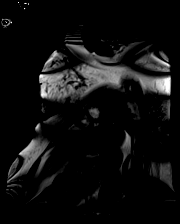

[Series 23: bSSFP · sagittal · 8.0mm · 2.01mm/px · 1 of 25 slices shown (15 of 20)]
[im 1/25]
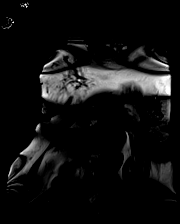

[Series 24: bSSFP · sagittal · 8.0mm · 2.01mm/px · 1 of 25 slices shown (16 of 20)]
[im 1/25]
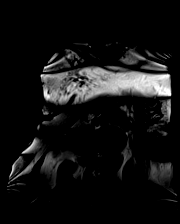

[Series 25: bSSFP · sagittal · 8.0mm · 2.01mm/px · 1 of 25 slices shown (17 of 20)]
[im 1/25]
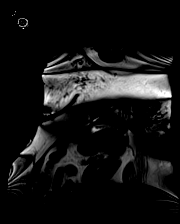

[Series 26: cine_trufi_cs_rt_short axis · sagittal · 8.0mm · 2.16mm/px · 1 of 52 slices shown (1 of 20)]
[im 1/52]
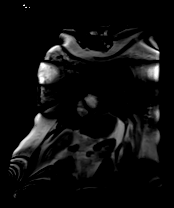

[Series 26: cine_trufi_cs_rt_short axis · sagittal · 8.0mm · 2.16mm/px · 1 of 52 slices shown (2 of 20)]
[im 1/52]
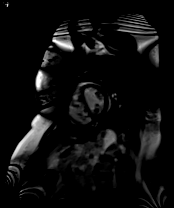

[Series 26: cine_trufi_cs_rt_short axis · sagittal · 8.0mm · 2.16mm/px · 1 of 52 slices shown (3 of 20)]
[im 1/52]
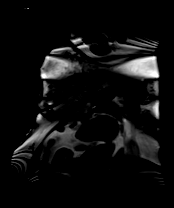

[Series 26: cine_trufi_cs_rt_short axis · sagittal · 8.0mm · 2.16mm/px · 1 of 52 slices shown (4 of 20)]
[im 1/52]
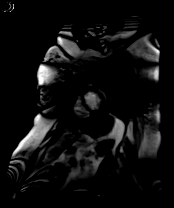

[Series 26: cine_trufi_cs_rt_short axis · sagittal · 8.0mm · 2.16mm/px · 1 of 52 slices shown (5 of 20)]
[im 1/52]
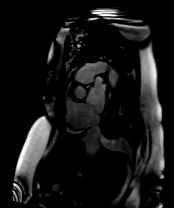

[Series 26: cine_trufi_cs_rt_short axis · sagittal · 8.0mm · 2.16mm/px · 1 of 52 slices shown (6 of 20)]
[im 1/52]
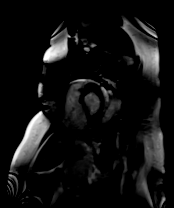

[Series 26: cine_trufi_cs_rt_short axis · sagittal · 8.0mm · 2.16mm/px · 1 of 52 slices shown (7 of 20)]
[im 1/52]
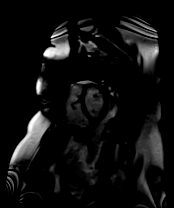

[Series 26: cine_trufi_cs_rt_short axis · sagittal · 8.0mm · 2.16mm/px · 1 of 52 slices shown (8 of 20)]
[im 1/52]
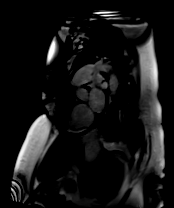

[Series 26: cine_trufi_cs_rt_short axis · sagittal · 8.0mm · 2.16mm/px · 1 of 52 slices shown (9 of 20)]
[im 1/52]
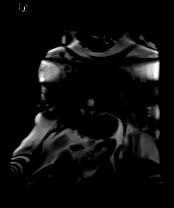

[Series 26: cine_trufi_cs_rt_short axis · sagittal · 8.0mm · 2.16mm/px · 1 of 52 slices shown (10 of 20)]
[im 1/52]
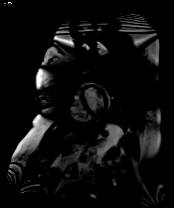

[Series 26: cine_trufi_cs_rt_short axis · sagittal · 8.0mm · 2.16mm/px · 1 of 52 slices shown (11 of 20)]
[im 1/52]
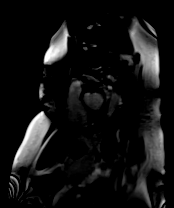

[Series 26: cine_trufi_cs_rt_short axis · sagittal · 8.0mm · 2.16mm/px · 1 of 52 slices shown (12 of 20)]
[im 1/52]
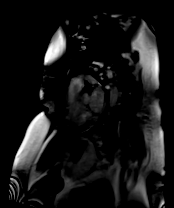

[Series 26: cine_trufi_cs_rt_short axis · sagittal · 8.0mm · 2.16mm/px · 1 of 52 slices shown (13 of 20)]
[im 1/52]
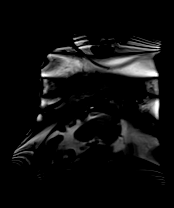

[Series 26: cine_trufi_cs_rt_short axis · sagittal · 8.0mm · 2.16mm/px · 1 of 52 slices shown (14 of 20)]
[im 1/52]
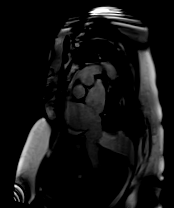

[Series 26: cine_trufi_cs_rt_short axis · sagittal · 8.0mm · 2.16mm/px · 1 of 52 slices shown (15 of 20)]
[im 1/52]
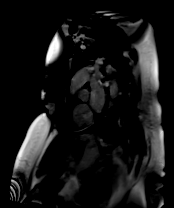

[Series 26: cine_trufi_cs_rt_short axis · sagittal · 8.0mm · 2.16mm/px · 1 of 52 slices shown (16 of 20)]
[im 1/52]
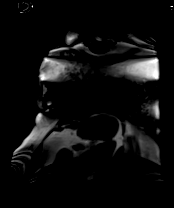

[Series 26: cine_trufi_cs_rt_short axis · sagittal · 8.0mm · 2.16mm/px · 1 of 52 slices shown (17 of 20)]
[im 1/52]
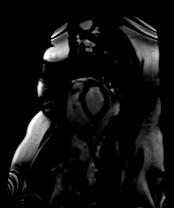

[Series 26: cine_trufi_cs_rt_short axis · sagittal · 8.0mm · 2.16mm/px · 1 of 52 slices shown (18 of 20)]
[im 1/52]
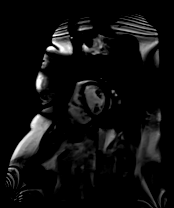

[Series 26: cine_trufi_cs_rt_short axis · sagittal · 8.0mm · 2.16mm/px · 1 of 52 slices shown (19 of 20)]
[im 1/52]
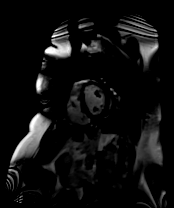

[Series 26: cine_trufi_cs_rt_short axis · sagittal · 8.0mm · 2.16mm/px · 1 of 52 slices shown (20 of 20)]
[im 1/52]
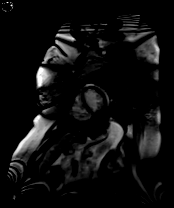

[Series 27: bSSFP · oblique · 6.0mm · 1.41mm/px · 1 of 25 slices shown (18 of 20)]
[im 1/25]
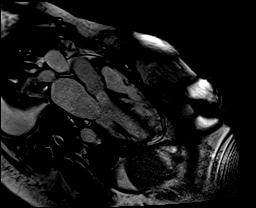

[Series 28: bSSFP · coronal · 6.0mm · 1.41mm/px · 1 of 25 slices shown (19 of 20)]
[im 1/25]
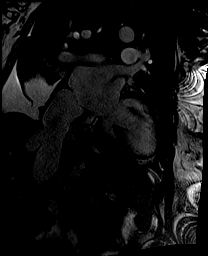

[Series 29: bSSFP · oblique · 6.0mm · 1.41mm/px · 1 of 25 slices shown (20 of 20)]
[im 1/25]
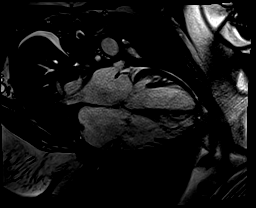

[Series 30: (id)_long_t1 · sagittal · 8.0mm · 1.41mm/px · 1 of 24 slices shown]
[im 1/24]
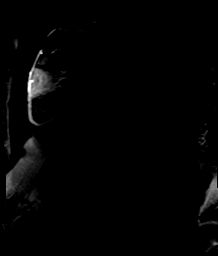

[Series 31: (id)_long_t1_moco · sagittal · 8.0mm · 1.41mm/px · 1 of 24 slices shown]
[im 1/24]
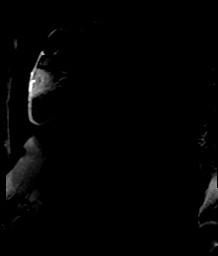

[Series 32: (id)_long_t1_moco_t1 · sagittal · 8.0mm · 1.41mm/px · 1 of 5 slices shown]
[im 1/5]
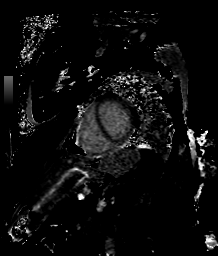

[44 of 48 positions shown; findings below may reference images not displayed]

FINDINGS: Moderate right pleural effusion with associated atelectasis.

Normal left ventricular size with diffuse hypokinesis, EF 41%.
Mildly dilated right ventricle with severe systolic dysfunction, EF
27%. D-shaped interventricular septum suggestive of RV
volume/pressure overload. Moderate biatrial enlargement. Trileaflet
aortic valve, no stenosis or regurgitation. Trivial mitral
regurgitation. Mild to moderate tricuspid regurgitation. Markedly
dilated IVC suggestive of elevated RV filling pressure.

On delayed enhancement imaging, there was a small area of late
gadolinium enhancement (LGE) at the basal inferoseptal RV insertion
site.

Measurements:

LVEDV 154 mL
LVSV 62 mL
LVEF 41%

RVEDV 188 mL
RVSV 51 mL
RVEF 27%
IMPRESSION: 1.  Normal LV size with EF 41%, diffuse hypokinesis.

2. Mildly dilated RV with severe systolic dysfunction, EF 27%.
D-shaped septum suggests RV pressure/volume overload.

3.  Markedly dilated IVC suggestive of elevated RV filling pressure.

4. Nonspecific RV insertion site LGE, this suggests LV
pressure/volume overload.

This study is suggestive of R > L heart failure.

Fu Weng Taopo

## 2022-07-01 MED ORDER — ISOSORBIDE MONONITRATE ER 30 MG PO TB24: 30.0000 mg | ORAL_TABLET | Freq: Every day | ORAL | Status: DC

## 2022-07-01 MED ORDER — SODIUM CHLORIDE 0.9 % IV SOLN: INTRAVENOUS | Status: DC

## 2022-07-01 MED ORDER — POTASSIUM CHLORIDE CRYS ER 20 MEQ PO TBCR
20.0000 meq | EXTENDED_RELEASE_TABLET | Freq: Two times a day (BID) | ORAL | Status: DC
  Administered 2022-07-01 – 2022-07-04 (×7): 20 meq via ORAL
  Filled 2022-07-01 (×7): qty 1

## 2022-07-01 MED ORDER — CARVEDILOL 3.125 MG PO TABS
3.1250 mg | ORAL_TABLET | Freq: Two times a day (BID) | ORAL | Status: DC
  Administered 2022-07-01 – 2022-07-04 (×6): 3.125 mg via ORAL
  Filled 2022-07-01 (×6): qty 1

## 2022-07-01 MED ORDER — SODIUM CHLORIDE 0.9 % IV SOLN
250.0000 mg | Freq: Every day | INTRAVENOUS | Status: AC
  Administered 2022-07-01 – 2022-07-04 (×4): 250 mg via INTRAVENOUS
  Filled 2022-07-01 (×4): qty 20

## 2022-07-01 MED ORDER — SODIUM CHLORIDE 0.9% FLUSH
3.0000 mL | Freq: Two times a day (BID) | INTRAVENOUS | Status: DC
Start: 2022-07-01 — End: 2022-07-03
  Administered 2022-07-01 (×2): 3 mL via INTRAVENOUS

## 2022-07-01 MED ORDER — ASPIRIN 81 MG PO CHEW
81.0000 mg | CHEWABLE_TABLET | ORAL | Status: AC
  Administered 2022-07-02: 81 mg via ORAL
  Filled 2022-07-01: qty 1

## 2022-07-01 MED ORDER — DARBEPOETIN ALFA 100 MCG/0.5ML IJ SOSY
100.0000 ug | PREFILLED_SYRINGE | INTRAMUSCULAR | Status: DC
  Administered 2022-07-01: 100 ug via SUBCUTANEOUS
  Filled 2022-07-01: qty 0.5

## 2022-07-01 MED ORDER — HYDRALAZINE HCL 25 MG PO TABS
12.5000 mg | ORAL_TABLET | Freq: Three times a day (TID) | ORAL | Status: DC
Start: 2022-07-01 — End: 2022-07-01

## 2022-07-01 MED ORDER — SODIUM CHLORIDE 0.9% FLUSH: 3.0000 mL | INTRAVENOUS | Status: DC | PRN

## 2022-07-01 MED ORDER — ACETAZOLAMIDE 250 MG PO TABS
250.0000 mg | ORAL_TABLET | Freq: Two times a day (BID) | ORAL | Status: AC
  Administered 2022-07-01 (×2): 250 mg via ORAL
  Filled 2022-07-01 (×2): qty 1

## 2022-07-01 MED ORDER — TORSEMIDE 20 MG PO TABS
40.0000 mg | ORAL_TABLET | Freq: Once | ORAL | Status: AC
  Administered 2022-07-01: 40 mg via ORAL
  Filled 2022-07-01: qty 2

## 2022-07-01 NOTE — Progress Notes (Signed)
PROGRESS NOTE    Rita [name]  EXN:[id number] DOB: [birth year] DOA: 06/26/2022 PCP: [name], MD  66/F with history of chronic systolic CHF, EF recovered, CKD 4, history of chronic GI bleed/SB AVMs, paroxysmal A-fib not on anticoagulation, type 2 diabetes mellitus, history of DVT/PE, IVC filter, chronic respiratory failure on 2 L home O2, obesity presented to the ED with worsening edema, abdominal distention.  Seen in clinic a week ago, torsemide dose increased -CT abdomen pelvis noted cardiomegaly, marked distention of hepatic veins and suprarenal IVC, large right pleural effusion, abdominal wall edema, liver cirrhosis, trace ascites and hypodensity in pancreatic body  -7/20 started Lasix gtt, R Thora -1.2L drained -7/21 starting milrinone, advanced heart failure team and nephrology following  Subjective: -Feels better, breathing continues to improve  Assessment and Plan:  Acute on chronic systolic CHF Large right pleural effusion -BiV failure, predominantly right heart failure -EF recovered to 55%, on echo 1/23 -Repeat echo with preserved EF, severe PAH and severe TR -Thoracentesis-1.2 L drained -Advanced heart failure team following, diuresed on Lasix gtt, milrinone -6.4 L negative, weight down 13 LB, mild bump in creatinine -Switch to oral torsemide today -GDMT limited by CKD 4/AKI, nephrology following, appreciate input -Plan for RHC tomorrow  AKI on CKD 4 -Baseline creatinine around 2.6, peaked at 3.6, now 3.1 -Suspect this is cardiorenal, diuretics as above, avoid hypotension -Advanced heart failure team consulted, followed by Dr.Foster -Continue milrinone, appreciate nephrology input, not a good HD candidate -Diuretics as above  Chronic anemia -Secondary to CKD 4 and chronic GI bleed, stable, -Anemia panel more suggestive of chronic disease  Cirrhosis -Secondary to right heart failure, no history of chronic hepatitis or alcoholism -Diuretics as  above, avoid Aldactone with CKD 4  Chronic hypoxic respiratory failure -Reportedly CPAP was taken away from her home recently, -Noncompliant with CPAP, counseled, TOC consult for home CPAP  Chronic A-fib -Rate controlled, Coreg on hold at this time, not on anticoagulation due to recurrent GI bleeds from small bowel AVMs  History of recurrent GI bleeds -History of small bowel AVMs, hemoglobin stable at this time, monitor  History of DVT/PE -Following orthopedic surgery, has IVC filter  Type 2 diabetes mellitus -CBGs are stable, hemoglobin A1c was 7.0  DVT prophylaxis:  Lovenox Code Status: Full code Family Communication: Discussed with patient detail, no family at bedside Disposition Plan: Home likely 2 to 3 days if creatinine improves  Consultants: Cardiology, nephrology   Procedures:   Antimicrobials:    Objective: Vitals:   07/01/22 0129 07/01/22 0425 07/01/22 0824 07/01/22 1126  BP:  123/65 (!) 116/56 (!) 121/55  Pulse:  78 78 67  Resp:    18  Temp:  98.8 F (37.1 C)  98.8 F (37.1 C)  TempSrc:  Oral  Oral  SpO2:  91%  98%  Weight: 86.6 kg     Height:        Intake/Output Summary (Last 24 hours) at 07/01/2022 1235 Last data filed at 07/01/2022 4967 Gross per 24 hour  Intake 240 ml  Output 1202 ml  Net -962 ml   Filed Weights   06/29/22 0500 06/30/22 0600 07/01/22 0129  Weight: 90.1 kg 87 kg 86.6 kg    Examination:  General exam: Obese chronically ill female sitting up in recliner, AAOx3, no distress HEENT: No JVD CVS: S1-S2, regular irregularly irregular rhythm Lungs: Decreased breath sounds bases Abdomen: Soft, nontender, bowel sounds present Extremities: 1+ edema, Unna boots on Skin: No rashes Psychiatry:  Mood & affect appropriate.     Data Reviewed:   CBC: Recent Labs  Lab 06/27/22 0254 06/27/22 0805 06/28/22 0340 06/29/22 0312 06/30/22 0648 07/01/22 0601  WBC 3.7* 3.8* 4.5 4.5 4.7 5.1  NEUTROABS 2.5  --   --   --   --   --    HGB 8.0* 8.3* 8.2* 8.2* 8.5* 8.2*  HCT 23.1* 24.0* 24.0* 24.4* 25.5* 24.5*  MCV 82.8 82.8 82.5 82.4 84.4 85.1  PLT 115* 120* 129* 137* 139* 935*   Basic Metabolic Panel: Recent Labs  Lab 06/27/22 0254 06/27/22 0805 06/27/22 1119 06/28/22 0340 06/29/22 0312 06/30/22 0648 07/01/22 0601  NA  --  140  --  139 135 139 140  K  --  3.2*  --  3.7 3.2* 3.9 3.5  CL  --  91*  --  92* 87* 88* 84*  CO2  --  36*  --  35* 36* 40* >45*  GLUCOSE  --  76  --  125* 138* 114* 103*  BUN  --  115*  --  103* 96* 87* 83*  CREATININE  --  3.56*  --  3.66* 3.50* 3.18* 3.36*  CALCIUM  --  9.1  --  9.1 8.7* 9.1 9.2  MG 2.8*  --  3.0* 2.7*  --   --   --   PHOS 5.1*  --  4.8*  --  3.5  --   --    GFR: Estimated Creatinine Clearance: 16 mL/min (A) (by C-G formula based on SCr of 3.36 mg/dL (H)). Liver Function Tests: Recent Labs  Lab 06/27/22 0254 06/28/22 0340 06/29/22 0312  AST 21 20  --   ALT 16 13  --   ALKPHOS 81 79  --   BILITOT 1.9* 1.9*  --   PROT 7.3 6.9  --   ALBUMIN 3.2* 3.1* 3.0*   No results for input(s): "LIPASE", "AMYLASE" in the last 168 hours. Recent Labs  Lab 06/27/22 0254  AMMONIA 44*   Coagulation Profile: Recent Labs  Lab 06/27/22 0254  INR 1.4*   Cardiac Enzymes: Recent Labs  Lab 06/27/22 0254  CKTOTAL 74   BNP (last 3 results) No results for input(s): "PROBNP" in the last 8760 hours. HbA1C: No results for input(s): "HGBA1C" in the last 72 hours.  CBG: Recent Labs  Lab 06/30/22 1631 06/30/22 2103 07/01/22 0634 07/01/22 0740 07/01/22 1124  GLUCAP 154* 253* 108* 110* 148*   Lipid Profile: No results for input(s): "CHOL", "HDL", "LDLCALC", "TRIG", "CHOLHDL", "LDLDIRECT" in the last 72 hours. Thyroid Function Tests: No results for input(s): "TSH", "T4TOTAL", "FREET4", "T3FREE", "THYROIDAB" in the last 72 hours.  Anemia Panel: Recent Labs    06/30/22 0645  FERRITIN 233  TIBC 228*  IRON 52   Urine analysis:    Component Value Date/Time    COLORURINE STRAW (A) 06/26/2022 2036   APPEARANCEUR CLEAR 06/26/2022 2036   LABSPEC 1.006 06/26/2022 2036   PHURINE 5.0 06/26/2022 2036   GLUCOSEU NEGATIVE 06/26/2022 2036   HGBUR NEGATIVE 06/26/2022 2036   BILIRUBINUR NEGATIVE 06/26/2022 2036   KETONESUR NEGATIVE 06/26/2022 2036   PROTEINUR NEGATIVE 06/26/2022 2036   NITRITE NEGATIVE 06/26/2022 2036   LEUKOCYTESUR NEGATIVE 06/26/2022 2036   Sepsis Labs: @LABRCNTIP (procalcitonin:4,lacticidven:4)  ) Recent Results (from the past 240 hour(s))  Culture, body fluid w Gram Stain-bottle     Status: None (Preliminary result)   Collection Time: 06/27/22  3:02 PM   Specimen: Pleura  Result Value Ref Range Status  Specimen Description PLEURAL  Final   Special Requests RIGHT LUNG  Final   Culture   Final    NO GROWTH 4 DAYS Performed at [facility] [address] [place], [place] [address]    Report Status PENDING  Incomplete  Gram stain     Status: None   Collection Time: 06/27/22  3:02 PM   Specimen: Fluid  Result Value Ref Range Status   Specimen Description FLUID  Final   Special Requests RIGHT LUNG  Final   Gram Stain   Final    WBC PRESENT, PREDOMINANTLY MONONUCLEAR NO ORGANISMS SEEN CYTOSPIN SMEAR Performed at [facility], [place] [address], [place], [place] [address]    Report Status 06/27/2022 FINAL  Final     Radiology Studies: No results found.   Scheduled Meds:  acetaZOLAMIDE  250 mg Oral BID   aspirin EC  81 mg Oral Daily   carvedilol  3.125 mg Oral BID WC   darbepoetin (ARANESP) injection - NON-DIALYSIS  100 mcg Subcutaneous Q Mon-1800   enoxaparin (LOVENOX) injection  30 mg Subcutaneous Q24H   hydrocerin   Topical BID   insulin aspart  0-5 Units Subcutaneous QHS   insulin aspart  0-9 Units Subcutaneous TID WC   lactose free nutrition  237 mL Oral TID WC   pantoprazole  40 mg Oral Q1200   potassium chloride  20 mEq Oral BID   sodium chloride flush  3 mL Intravenous Q12H   sodium chloride  flush  3 mL Intravenous Q12H   Continuous Infusions:  sodium chloride     ferric gluconate (FERRLECIT) IVPB 250 mg (07/01/22 1143)     LOS: 5 days    Time spent: 13min    Domenic Polite, MD [facility]   07/01/2022, 12:35 PM

## 2022-07-01 NOTE — Progress Notes (Signed)
[facility] Progress Note    Assessment/ Plan:    AKI/CKD stage IV - presumably due to cardiorenal syndrome.  Continue with IV diuresis as per cardiology and follow UOP and daily Scr levels.  No urgent indication for dialysis patients at this time. Given advanced RV failure, would not be an ideal long term dialysis candidate as risks>benefits,  Would recommend a palliative consultation if heart failure agrees as well to address GOC--patient is okay with this--will defer to primary service  on lasix gtt and milrinone per HF-  but appears no longer. Cr  improved til today  but BUN down. Having contraction alkalosis on labs, may need to back off on lasix gtt soon Renal dose meds for eGFR <15.   Avoid nephrotoxic agents such as IV contrast, NSAIDs/Cox-II I's, phosphate containing bowel preps (FLEETS). 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 Chronic hypoxemic respiratory failure - due to OHS/OSA.  Cpap in hospital however patient continues to refuse Atrial fibrillation -  chronic.  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 HTN - stable H/o DVT/PE - s/p IVC Right pleural effusion - s/p right thoracentesis 7/21 with IR and removed 1.2 Liters Cardiac cirrhosis - GI consulted, signed off. Likely related to chronic congestive hepatopathy  Hypoekalemia- replete prn Anemia- iron sat 23 with ferritin 233-  will give iron-  also will start ESA   Subjective:   Pt seen-  she continues to diurese-  BUN is down but crt has trended up from yest to today -  co2 > 45.  Clinically she seems to be doing well    Objective:   BP (!) 116/56 (BP Location: Left Arm)   Pulse 78   Temp 98.8 F (37.1 C) (Oral)   Resp 20   Ht 5' 1" (1.549 m)   Wt 86.6 kg Comment: scale c  SpO2 91%   BMI 36.09 kg/m   Intake/Output Summary (Last 24 hours) at 07/01/2022 0859 Last data filed at 07/01/2022  0300 Gross per 24 hour  Intake 240 ml  Output 1700 ml  Net -1460 ml   Weight change: -0.363 kg  Physical Exam: Gen:ill appearing, NAD, sitting up in bed eating breakfast CVS:RRR Resp:diminished air entry bibasilar, normal WOB GYK:ZLDJT, soft Ext: does have edema (legs wrapped) Neuro: awake, alert  Imaging: No results found.  Labs: BMET Recent Labs  Lab 06/26/22 1212 06/27/22 0254 06/27/22 0805 06/27/22 1119 06/28/22 0340 06/29/22 0312 06/30/22 0648 07/01/22 0601  NA 135  --  140  --  139 135 139 140  K 3.7  --  3.2*  --  3.7 3.2* 3.9 3.5  CL 91*  --  91*  --  92* 87* 88* 84*  CO2 34*  --  36*  --  35* 36* 40* >45*  GLUCOSE 151*  --  76  --  125* 138* 114* 103*  BUN 109*  --  115*  --  103* 96* 87* 83*  CREATININE 3.68*  --  3.56*  --  3.66* 3.50* 3.18* 3.36*  CALCIUM 9.5  --  9.1  --  9.1 8.7* 9.1 9.2  PHOS  --  5.1*  --  4.8*  --  3.5  --   --    CBC Recent Labs  Lab 06/27/22 0254 06/27/22 0805 06/28/22 0340 06/29/22 0312 06/30/22 0648 07/01/22 0601  WBC 3.7*   < > 4.5 4.5 4.7  5.1  NEUTROABS 2.5  --   --   --   --   --   HGB 8.0*   < > 8.2* 8.2* 8.5* 8.2*  HCT 23.1*   < > 24.0* 24.4* 25.5* 24.5*  MCV 82.8   < > 82.5 82.4 84.4 85.1  PLT 115*   < > 129* 137* 139* 148*   < > = values in this interval not displayed.    Medications:     acetaZOLAMIDE  250 mg Oral BID   aspirin EC  81 mg Oral Daily   carvedilol  3.125 mg Oral BID WC   enoxaparin (LOVENOX) injection  30 mg Subcutaneous Q24H   hydrocerin   Topical BID   insulin aspart  0-5 Units Subcutaneous QHS   insulin aspart  0-9 Units Subcutaneous TID WC   lactose free nutrition  237 mL Oral TID WC   pantoprazole  40 mg Oral Q1200   potassium chloride  20 mEq Oral BID   sodium chloride flush  3 mL Intravenous Q12H   sodium chloride flush  3 mL Intravenous Q12H   torsemide  40 mg Oral Once     [employer] 07/01/2022, 8:59 AM \

## 2022-07-01 NOTE — Progress Notes (Signed)
Patient and patient's husband has questions for Dr Aundra Dubin before signing the consent for the right heart cath. Informed oncoming shift nurse. Consent is in the chart. Patient did not sign the consent.

## 2022-07-01 NOTE — Progress Notes (Signed)
Orthopedic Tech Progress Note Patient Details:  Rita [name] 11-09-1956 [id number]   At 2763 this morning I called and asked to secretary to let RN know I would be there at 1500 to do fresh unna boots, yet nothing has been done. Will try again before 1900 if not next shift wil do it.   Patient ID: Rita [name], female   DOB: 09/01/1956, 66 y.o.   MRN: [id number]  Rita [name] 07/01/2022, 3:21 PM

## 2022-07-01 NOTE — Progress Notes (Signed)
Occupational Therapy Treatment Patient Details Name: Prairie Stenberg MRN: [id number] DOB: 1956/02/18 Today's Date: 07/01/2022   History of present illness 66 y.o. F admitted on 06/26/22 due to increased O2 needs and swelling. PMH significant for diabetes mellitus diastolic CHF chronic respiratory failure on 1 to 2 L of oxygen anemia, atrial fibrillation, CKD stage III hypertension thrombocytopenia GI bleed.   OT comments  Patient up in recliner and asking to use BSC. Patient was min guard to transfer to [facility] and perform toilet hygiene. Patient ambulated to sink to perform hand and face hygiene with min guard assist.  Static standing balance activities performed with min guard assist and no LOB. Patient making good progress with OT treatment. Discharge plans continue to be appropriate.    Recommendations for follow up therapy are one component of a multi-disciplinary discharge planning process, led by the attending physician.  Recommendations may be updated based on patient status, additional functional criteria and insurance authorization.    Follow Up Recommendations  Home health OT    Assistance Recommended at Discharge Set up Supervision/Assistance  Patient can return home with the following  A little help with walking and/or transfers;A little help with bathing/dressing/bathroom;Assistance with cooking/housework   Equipment Recommendations  None recommended by OT    Recommendations for Other Services      Precautions / Restrictions Precautions Precautions: Fall Restrictions Weight Bearing Restrictions: No       Mobility Bed Mobility Overal bed mobility: Modified Independent             General bed mobility comments: up in recliner    Transfers Overall transfer level: Needs assistance Equipment used: Rolling walker (2 wheels) Transfers: Sit to/from Stand, Bed to chair/wheelchair/BSC Sit to Stand: Supervision     Step pivot transfers: Min guard      General transfer comment: min guard for safety due to lines     Balance Overall balance assessment: Needs assistance Sitting-balance support: No upper extremity supported, Feet supported Sitting balance-Leahy Scale: Good     Standing balance support: Bilateral upper extremity supported, During functional activity, Reliant on assistive device for balance Standing balance-Leahy Scale: Poor Standing balance comment: able to stand at sink for grooming with sink for support. Static standing balance performed with min guard assist                           ADL either performed or assessed with clinical judgement   ADL Overall ADL's : Needs assistance/impaired     Grooming: Wash/dry hands;Wash/dry face;Min guard;Standing Grooming Details (indicate cue type and reason): at sink                 Toilet Transfer: Min Dispensing optician Details (indicate cue type and reason): step pivot from recliner Toileting- Clothing Manipulation and Hygiene: Min guard;Sitting/lateral lean;Sit to/from stand Toileting - Clothing Manipulation Details (indicate cue type and reason): min guard for safety       General ADL Comments: min guard for safety when standing    Extremity/Trunk Assessment              Vision       Perception     Praxis      Cognition Arousal/Alertness: Awake/alert Behavior During Therapy: WFL for tasks assessed/performed Overall Cognitive Status: Within Functional Limits for tasks assessed  Exercises      Shoulder Instructions       General Comments      Pertinent Vitals/ Pain       Pain Assessment Pain Assessment: No/denies pain Pain Intervention(s): Monitored during session  Home Living                                          Prior Functioning/Environment              Frequency  Min 2X/week        Progress Toward Goals  OT  Goals(current goals can now be found in the care plan section)  Progress towards OT goals: Progressing toward goals  Acute Rehab OT Goals Patient Stated Goal: go home OT Goal Formulation: With patient Time For Goal Achievement: 07/11/22 Potential to Achieve Goals: Good ADL Goals Pt Will Perform Grooming: with modified independence;standing Pt Will Perform Lower Body Bathing: with modified independence;sitting/lateral leans;sit to/from stand Pt Will Perform Lower Body Dressing: with modified independence;sitting/lateral leans;sit to/from stand Pt Will Transfer to Toilet: with modified independence;ambulating Pt Will Perform Toileting - Clothing Manipulation and hygiene: with modified independence;sitting/lateral leans;sit to/from stand  Plan Discharge plan remains appropriate    Co-evaluation                 AM-PAC OT "6 Clicks" Daily Activity     Outcome Measure   Help from another person eating meals?: A Little Help from another person taking care of personal grooming?: A Little Help from another person toileting, which includes using toliet, bedpan, or urinal?: A Little Help from another person bathing (including washing, rinsing, drying)?: A Little Help from another person to put on and taking off regular upper body clothing?: A Little Help from another person to put on and taking off regular lower body clothing?: A Little 6 Click Score: 18    End of Session Equipment Utilized During Treatment: Rolling walker (2 wheels)  OT Visit Diagnosis: Unsteadiness on feet (R26.81);Other abnormalities of gait and mobility (R26.89);Muscle weakness (generalized) (M62.81)   Activity Tolerance Patient tolerated treatment well   Patient Left in chair;with call bell/phone within reach   Nurse Communication Mobility status        Time: 2130-8657 OT Time Calculation (min): 21 min  Charges: OT General Charges $OT Visit: 1 Visit OT Treatments $Self Care/Home Management : 8-22  mins  Lodema Hong, Ashley  Office [place] 07/01/2022, 8:56 AM

## 2022-07-01 NOTE — Plan of Care (Signed)
?  Problem: Clinical Measurements: ?Goal: Respiratory complications will improve ?Outcome: Progressing ?  ?Problem: Elimination: ?Goal: Will not experience complications related to urinary retention ?Outcome: Progressing ?  ?

## 2022-07-01 NOTE — Progress Notes (Signed)
Physical Therapy Treatment Patient Details Name: Rita [name] MRN: [id number] DOB: 1956/12/02 Today's Date: 07/01/2022   History of Present Illness 66 y.o. F admitted on 06/26/22 due to increased O2 needs and swelling. PMH significant for diabetes mellitus diastolic CHF chronic respiratory failure on 1 to 2 L of oxygen anemia, atrial fibrillation, CKD stage III hypertension thrombocytopenia GI bleed.    PT Comments    Pt received in chair, reports that she is feeling better but does not like the hospital CPAP as it gives her a HA. Pt ambulated 300' with rollator with supervision and 2L O2 with sats remaining >88%. HR up to 100 bpm by end of activity, returned to upper 60s with seated rest break. Practiced safe transfers on rollator. Pt ambulation distance limited by knee pain, discussed HEP as well as single limb stance balance exercises with UE support for increasing knee stability. PT will continue to follow.    Recommendations for follow up therapy are one component of a multi-disciplinary discharge planning process, led by the attending physician.  Recommendations may be updated based on patient status, additional functional criteria and insurance authorization.  Follow Up Recommendations  No PT follow up     Assistance Recommended at Discharge PRN  Patient can return home with the following A little help with bathing/dressing/bathroom;Assistance with cooking/housework;Assist for transportation;Help with stairs or ramp for entrance   Equipment Recommendations  None recommended by PT    Recommendations for Other Services       Precautions / Restrictions Precautions Precautions: Fall Restrictions Weight Bearing Restrictions: No     Mobility  Bed Mobility Overal bed mobility: Modified Independent             General bed mobility comments: up in recliner    Transfers Overall transfer level: Needs assistance Equipment used: Rolling walker (2  wheels) Transfers: Sit to/from Stand Sit to Stand: Supervision           General transfer comment: min guard for safety as pt unsteady without UE support    Ambulation/Gait Ambulation/Gait assistance: Supervision Gait Distance (Feet): 300 Feet Assistive device: Rollator (4 wheels) Gait Pattern/deviations: Step-through pattern, Decreased stride length Gait velocity: Reduced Gait velocity interpretation: >2.62 ft/sec, indicative of community ambulatory   General Gait Details: pt reports knee pain with increased distance, seated rest break taken at 150'. Pt O2 sats remained >88% on 2L O2, HR up to 100 bpm with activity but returned to high 60s with seated rest break.   Stairs             Wheelchair Mobility    Modified Rankin (Stroke Patients Only)       Balance Overall balance assessment: Needs assistance Sitting-balance support: No upper extremity supported, Feet supported Sitting balance-Leahy Scale: Good     Standing balance support: Bilateral upper extremity supported, During functional activity, Reliant on assistive device for balance Standing balance-Leahy Scale: Poor Standing balance comment: performed standing ther ex with UE support at sink, needs to support for stability                            Cognition Arousal/Alertness: Awake/alert Behavior During Therapy: WFL for tasks assessed/performed Overall Cognitive Status: Within Functional Limits for tasks assessed  Exercises General Exercises - Lower Extremity Mini-Sqauts: AROM, 10 reps, Standing    General Comments General comments (skin integrity, edema, etc.): reviewed HEP as well as activity level upon return home. Pt knowledgeable about UE and LE ther ex. Discussed unilateral stance balance activities with UE support at sink      Pertinent Vitals/Pain Pain Assessment Pain Assessment: No/denies pain    Home Living                           Prior Function            PT Goals (current goals can now be found in the care plan section) Acute Rehab PT Goals Patient Stated Goal: Return home PT Goal Formulation: With patient Time For Goal Achievement: 07/12/22 Potential to Achieve Goals: Good Progress towards PT goals: Progressing toward goals    Frequency    Min 3X/week      PT Plan Current plan remains appropriate    Co-evaluation              AM-PAC PT "6 Clicks" Mobility   Outcome Measure  Help needed turning from your back to your side while in a flat bed without using bedrails?: None Help needed moving from lying on your back to sitting on the side of a flat bed without using bedrails?: None Help needed moving to and from a bed to a chair (including a wheelchair)?: None Help needed standing up from a chair using your arms (e.g., wheelchair or bedside chair)?: A Little Help needed to walk in hospital room?: A Little Help needed climbing 3-5 steps with a railing? : A Little 6 Click Score: 21    End of Session Equipment Utilized During Treatment: Oxygen Activity Tolerance: Patient tolerated treatment well Patient left: in chair;with call bell/phone within reach Nurse Communication: Mobility status PT Visit Diagnosis: Other abnormalities of gait and mobility (R26.89);Muscle weakness (generalized) (M62.81);Difficulty in walking, not elsewhere classified (R26.2)     Time: 3762-8315 PT Time Calculation (min) (ACUTE ONLY): 22 min  Charges:  $Gait Training: 8-22 mins                     [place] chat preferred Office [place] 07/01/2022, 11:30 AM

## 2022-07-01 NOTE — Care Management Important Message (Signed)
Important Message  Patient Details  Name: Rita [name] MRN: [id number] Date of Birth: Jul 29, 1956   Medicare Important Message Given:  Yes     [name] 07/01/2022, 9:11 AM

## 2022-07-01 NOTE — Progress Notes (Signed)
Pt refuses CPAP 

## 2022-07-01 NOTE — Progress Notes (Addendum)
Advanced Heart Failure Rounding Note  PCP-Cardiologist: None   Subjective:    Stopping milrinone gtt today, lasix gtt stopped yesterday. Weight stable. Total down 14lbs.   Feels good this am. Up and walking with cardiac rehab. Denies SOB, orthopnea or PND. Scr 3.66 -> 3.50  -> 3.18-> 3.36  Objective:   Weight Range: 86.6 kg Body mass index is 36.09 kg/m.   Vital Signs:   Temp:  [98.4 F (36.9 C)-98.8 F (37.1 C)] 98.8 F (37.1 C) (07/24 0425) Pulse Rate:  [66-78] 78 (07/24 0425) Resp:  [18-20] 20 (07/23 1925) BP: (111-123)/(55-65) 123/65 (07/24 0425) SpO2:  [91 %-98 %] 91 % (07/24 0425) Weight:  [86.6 kg] 86.6 kg (07/24 0129) Last BM Date : 06/30/22  Weight change: Filed Weights   06/29/22 0500 06/30/22 0600 07/01/22 0129  Weight: 90.1 kg 87 kg 86.6 kg    Intake/Output:   Intake/Output Summary (Last 24 hours) at 07/01/2022 0740 Last data filed at 07/01/2022 0300 Gross per 24 hour  Intake 240 ml  Output 1700 ml  Net -1460 ml      Physical Exam    General:  chronically ill appearing. Up in chair. No respiratory difficulty HEENT: normal Neck: supple. JVD 9-10 cm. Carotids 2+ bilat; no bruits. No lymphadenopathy or thyromegaly appreciated. Cor: PMI nondisplaced. Irregular rate & rhythm. No rubs, gallops or murmurs. Lungs: clear, diminished at bases Abdomen: soft, nontender, nondistended. No hepatosplenomegaly. No bruits or masses. Good bowel sounds. Extremities: no cyanosis, clubbing, rash, tr edema  + UNNA Neuro: alert & oriented x 3, cranial nerves grossly intact. moves all 4 extremities w/o difficulty. Affect pleasant.  Telemetry   A fib in 70-80s (Personally reviewed)   Labs    CBC Recent Labs    06/30/22 0648 07/01/22 0601  WBC 4.7 5.1  HGB 8.5* 8.2*  HCT 25.5* 24.5*  MCV 84.4 85.1  PLT 139* 518*   Basic Metabolic Panel Recent Labs    06/29/22 0312 06/30/22 0648  NA 135 139  K 3.2* 3.9  CL 87* 88*  CO2 36* 40*  GLUCOSE 138* 114*   BUN 96* 87*  CREATININE 3.50* 3.18*  CALCIUM 8.7* 9.1  PHOS 3.5  --    Liver Function Tests Recent Labs    06/29/22 0312  ALBUMIN 3.0*   No results for input(s): "LIPASE", "AMYLASE" in the last 72 hours. Cardiac Enzymes No results for input(s): "CKTOTAL", "CKMB", "CKMBINDEX", "TROPONINI" in the last 72 hours.   BNP: BNP (last 3 results) Recent Labs    05/28/22 0935 06/19/22 1059 06/26/22 1212  BNP 722.5* 688.3* 999.4*    ProBNP (last 3 results) No results for input(s): "PROBNP" in the last 8760 hours.   D-Dimer No results for input(s): "DDIMER" in the last 72 hours. Hemoglobin A1C No results for input(s): "HGBA1C" in the last 72 hours.  Fasting Lipid Panel No results for input(s): "CHOL", "HDL", "LDLCALC", "TRIG", "CHOLHDL", "LDLDIRECT" in the last 72 hours. Thyroid Function Tests No results for input(s): "TSH", "T4TOTAL", "T3FREE", "THYROIDAB" in the last 72 hours.  Invalid input(s): "FREET3"   Other results:   Imaging    No results found.   Medications:     Scheduled Medications:  aspirin EC  81 mg Oral Daily   enoxaparin (LOVENOX) injection  30 mg Subcutaneous Q24H   hydrocerin   Topical BID   insulin aspart  0-5 Units Subcutaneous QHS   insulin aspart  0-9 Units Subcutaneous TID WC   lactose free nutrition  237 mL Oral TID WC   pantoprazole  40 mg Oral Q1200   sodium chloride flush  3 mL Intravenous Q12H    Infusions:  sodium chloride     milrinone 0.125 mcg/kg/min (06/29/22 0958)    PRN Medications: sodium chloride, acetaminophen **OR** acetaminophen, albuterol, bisacodyl, bisacodyl, HYDROcodone-acetaminophen, lidocaine (PF), polyethylene glycol, sodium chloride flush    Patient Profile   Ms Wyer is a 66 y.o. 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 Here for Acute / Chronic diastolic HF with marked volume overload.  Assessment/Plan   1.  Acute / Chronic diastolic HF  - Prominent signs of RV failure.  9/21 echo showed EF 40-45%, global hypokinesis, D-shaped IV septum suggestive of RV pressure/volume overload, RV poorly visualized but appeared normal in size with at least mild systolic dysfunction. Cause of cardiomyopathy is uncertain.  She has history of COVID-19 1/21, cannot rule out viral myocarditis. Chemotherapy-mediated CMP is possible (not sure what chemo she had remotely for breast cancer).  Cardiac MRI showed LV EF 41% with diffuse hypokinesis, RV mildly dilated but with EF 27% and D-shaped septum, nonspecific RV insertion site LGE.  Suspect RV > LV failure. Cardiac MRI is not suggestive of amyloidosis, no M-spike on myeloma panel. We have not ruled out CAD (cath deferred given lack of ischemic CP and CKD). Milrinone started primarily for RV support during admission in 9/21 and titrated off. She had cardiomems placed 1/22. Echo in 1/23 showed improved LV function, EF 55-60%, mild LVH, mild RV enlargement with normal systolic function, IVC dilated, PASP 68 mmHg. NYHA class III-IIIb symptoms, functional capacity limited by body habitus as well as CHF.  - BNP 999, HsTrop flat 137>>144>>155>>161, Cr 3.56, CXR: Increased right basilar opacity is noted most consistent with subsegmental atelectasis or asymmetric edema with associated pleural effusion, CT abdomen pelvis noted cardiomegaly, marked distention of hepatic veins and suprarenal IVC, large right pleural effusion, abdominal wall edema, liver cirrhosis, trace ascites and hypodensity in pancreatic body. - Admitted with marked edema.  Cardiomems last 20 (7/18), goal 21 c/w R>L HF - ECHO this admit: LVEF 60-65%, The interventricular septum is flattened in systole and diastole, consistent w/ RV pressure and volume overload. RV mildly enlarged. RV function normal, severaly elevated PASP, LA severely dilated, RA normal size, mital annular calcification, trivial MR, severe TR - Start carvedilol at 3.125 (home dose 6.25) - Continue holding hydralazine + Imdur  - GFR < 20, not a candidate for SGLT2i. - Volume status much improved. Lasix gtt stopped yesterday (pt felt lightheaded). Stopping milrinone today. - Check ReDS clip - Plan RHC tomorrow - Acetazolamide 250 BID and torsemide 40 mg today x1.  - No ARNI/spironolactone with elevated creatinine.  - BLE VAS [place] 7/20: (-) DVT - Daily weights and Strict I&O - Follow BMET - Discussed importance of following a low-salt diet and monitoring fluid intake.    2. Chronic hypoxemic respiratory failure: OHS/OSA.  She was using home oxygen prior to COVID-19 PNA.  - Not currently wearing CPAP at home, has hospital one at bedside but refuses to use overnight d/t sudden headache per patient, addiment  about not wanting to try this one again. - Stable on O2 via [place]   3. Atrial fibrillation: Chronic, all ECGs since 7/21 show atrial fibrillation.  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 Rate is controlled. No overt bleeding.  - She cannot be cardioverted at this point as she cannot be chronically anticoagulated. - Evaluated by Dr. Quentin Ore for [place] but not a suitable candidate given multiple severe medical comorbidities and risks associated with general anesthesia. - Stable   4. HTN - stable   5. Hx of GI bleeding: Recurrent, from small bowel AVMs. Unable to be anticoagulated. No recent overt bleeding.  - Stable hgb recently.    6. H/o DVT/PE: Provoked (post-surgery). No longer on anticoagulation due to h/o significant GIB.  - Has IVC filter.     7. AKI on CKD Stage IV.  - Daily BMET, Cr. 3.66 -> 3.5 -> 3.18-> 3.36.  baseline 2.6 - Denies NSAID use - Renal US 06/27/22: Unremarkable renal ultrasound. - not HD candidate per Renal - lasix gtt stopped yesterday   8. Hypokalemia - K 3.5, repleat today  - Daily BMET - supp as needed    9. Right pleural effusion - s/p thoracentesis (last 08/31/20) (d/t CHF)  - CT scan this admit with large right pleural effusion - thoracentesis 06/27/22- Yielded 1.2 L of hazy amber fluid, cultures NGTD   10. DM II - continue SSI - Hgb A1c 7   11. Anemia - Suspect due to chronic low grade GI bleeding (small bowel AVMs). - Daily CBC - Hgb today 8.2. Baseline 8-9 - Iron profile stable: tSat 22, ferritin 245.    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 No focal abnormality on this limited unenhanced exam. No evidence of cholelithiasis or cholecystitis. - Denies ETOH use - Hep A,B,C (-) - AST/ALT stable - Ammonia 44, tBili 1.9 - GI saw and signed off: they would not do further serologic evaluation other than those studies pending i.e. hepatitis serologies and ANA. Eventually would consider EGD.   13. Thrombocytopenia  - chronic - monitor for bleeding  14. NSVT - Keep K > 4.0 Mg > 2,0  - Daily BMET  15. Enlarged thyroid - Thyroid US 06/27/20: 1.5 cm left mid thyroid TR 3 nodule - TSH was WNL .592  Length of Stay: [place], AGACNP-BC  7:40 AM  07/01/2022, 7:40 AM  Advanced Heart Failure Team Pager [phone] (M-F; 7a - 5p)  Please contact [facility] for night-coverage after hours (5p -7a ) and weekends on [email or web address]   Patient seen with NP, agree with the above note.   Breathing is better. Lasix gtt stopped yesterday, creatinine mildly higher at 3.36 but BUN lower.  HCO3 > 45.    General: NAD Neck: JVP 8-9 cm, no thyromegaly or thyroid nodule.  Lungs: Clear to auscultation bilaterally with normal  respiratory effort. CV: Nondisplaced PMI.  Heart regular S1/S2, no S3/S4, no murmur.  1+ edema 1/2 to knees bilaterally.  Abdomen: Soft, nontender, no hepatosplenomegaly, no distention.  Skin: Intact without lesions or rashes.  Neurologic: Alert and oriented x 3.  Psych: Normal affect. Extremities: No clubbing or cyanosis.  HEENT: Normal.   Patient has RV dysfunction with severe TR in setting of OHS/OSA.  Patient still looks mildly volume overloaded on exam but creatinine is a bit higher with metabolic alkalosis.  - Would give torsemide 40 mg  once today with acetazolamide 250 mg bid x 2 doses today.  She is on torsemide 100 mg bid at home with metolazone once a week.  - RHC tomorrow to assess volume status. Discussed risks/benefits and she agrees to procedure.   Renal function mildly higher today, remains above baseline.  Changes to diuretics as above.  With RV failure would likely be a poor HD candidate.   She has chronic AF, cannot be anticoagulated due to recurrent GI bleeding and not candidate for [place].   Mobilize.   Loralie Champagne 07/01/2022 12:33 PM

## 2022-07-01 NOTE — Progress Notes (Signed)
Mobility Specialist - Progress Note   07/01/22 1433  Mobility  Activity Ambulated with assistance in hallway  Level of Assistance Modified independent, requires aide device or extra time  Assistive Device Four wheel walker  Distance Ambulated (ft) 250 ft  $Mobility charge 1 Mobility   Pt received in recliner and agreeable to mobility. Pt required one seated break during session. Pt left in recliner with call bell within reach and all needs met.   Sydney Joyce Mobility Specialist  

## 2022-07-02 ENCOUNTER — Encounter (HOSPITAL_COMMUNITY)
Admission: EM | Disposition: A | Discharge status: Home / Self Care | Payer: Self-pay | Source: Home / Self Care | Attending: Internal Medicine

## 2022-07-02 ENCOUNTER — Encounter (HOSPITAL_COMMUNITY): Payer: Self-pay | Admitting: Cardiology

## 2022-07-02 DIAGNOSIS — I5033 Acute on chronic diastolic (congestive) heart failure: Secondary | ICD-10-CM | POA: Diagnosis not present

## 2022-07-02 HISTORY — PX: RIGHT HEART CATH: CATH118263

## 2022-07-02 LAB — BASIC METABOLIC PANEL
Anion gap: 14 (ref 5–15)
BUN: 75 mg/dL — ABNORMAL HIGH (ref 8–23)
CO2: 43 mmol/L — ABNORMAL HIGH (ref 22–32)
Calcium: 9.5 mg/dL (ref 8.9–10.3)
Chloride: 82 mmol/L — ABNORMAL LOW (ref 98–111)
Creatinine, Ser: 3.37 mg/dL — ABNORMAL HIGH (ref 0.44–1.00)
GFR, Estimated: 14 mL/min — ABNORMAL LOW (ref >60)
Glucose, Bld: 116 mg/dL — ABNORMAL HIGH (ref 70–99)
Potassium: 3.6 mmol/L (ref 3.5–5.1)
Sodium: 139 mmol/L (ref 135–145)

## 2022-07-02 LAB — POCT I-STAT EG7
Acid-Base Excess: 21 mmol/L — ABNORMAL HIGH (ref 0.0–2.0)
Acid-Base Excess: 22 mmol/L — ABNORMAL HIGH (ref 0.0–2.0)
Bicarbonate: 48.3 mmol/L — ABNORMAL HIGH (ref 20.0–28.0)
Bicarbonate: 49.1 mmol/L — ABNORMAL HIGH (ref 20.0–28.0)
Calcium, Ion: 1.14 mmol/L — ABNORMAL LOW (ref 1.15–1.40)
Calcium, Ion: 1.16 mmol/L (ref 1.15–1.40)
HCT: 31 % — ABNORMAL LOW (ref 36.0–46.0)
HCT: 31 % — ABNORMAL LOW (ref 36.0–46.0)
Hemoglobin: 10.5 g/dL — ABNORMAL LOW (ref 12.0–15.0)
Hemoglobin: 10.5 g/dL — ABNORMAL LOW (ref 12.0–15.0)
O2 Saturation: 61 %
O2 Saturation: 61 %
Potassium: 3.6 mmol/L (ref 3.5–5.1)
Potassium: 3.6 mmol/L (ref 3.5–5.1)
Sodium: 139 mmol/L (ref 135–145)
Sodium: 139 mmol/L (ref 135–145)
TCO2: >50 mmol/L — ABNORMAL HIGH (ref 22–32)
TCO2: >50 mmol/L — ABNORMAL HIGH (ref 22–32)
pCO2, Ven: 67.1 mmHg — ABNORMAL HIGH (ref 44–60)
pCO2, Ven: 68.1 mmHg — ABNORMAL HIGH (ref 44–60)
pH, Ven: 7.465 — ABNORMAL HIGH (ref 7.25–7.43)
pH, Ven: 7.466 — ABNORMAL HIGH (ref 7.25–7.43)
pO2, Ven: 32 mmHg (ref 32–45)
pO2, Ven: 32 mmHg (ref 32–45)

## 2022-07-02 LAB — GLUCOSE, CAPILLARY
Glucose-Capillary: 107 mg/dL — ABNORMAL HIGH (ref 70–99)
Glucose-Capillary: 109 mg/dL — ABNORMAL HIGH (ref 70–99)
Glucose-Capillary: 181 mg/dL — ABNORMAL HIGH (ref 70–99)
Glucose-Capillary: 175 mg/dL — ABNORMAL HIGH (ref 70–99)

## 2022-07-02 LAB — CULTURE, BODY FLUID W GRAM STAIN -BOTTLE: Culture: NO GROWTH

## 2022-07-02 SURGERY — RIGHT HEART CATH
Anesthesia: LOCAL

## 2022-07-02 MED ORDER — LABETALOL HCL 5 MG/ML IV SOLN: 10.0000 mg | INTRAVENOUS | Status: AC | PRN

## 2022-07-02 MED ORDER — ACETAZOLAMIDE 250 MG PO TABS
250.0000 mg | ORAL_TABLET | Freq: Two times a day (BID) | ORAL | Status: AC
  Administered 2022-07-02 (×2): 250 mg via ORAL
  Filled 2022-07-02 (×2): qty 1

## 2022-07-02 MED ORDER — ONDANSETRON HCL 4 MG/2ML IJ SOLN: 4.0000 mg | Freq: Four times a day (QID) | INTRAMUSCULAR | Status: DC | PRN

## 2022-07-02 MED ORDER — ACETAMINOPHEN 325 MG PO TABS: 650.0000 mg | ORAL_TABLET | ORAL | Status: DC | PRN

## 2022-07-02 MED ORDER — SODIUM CHLORIDE 0.9% FLUSH: 3.0000 mL | INTRAVENOUS | Status: DC | PRN

## 2022-07-02 MED ORDER — HYDRALAZINE HCL 20 MG/ML IJ SOLN: 10.0000 mg | INTRAMUSCULAR | Status: AC | PRN

## 2022-07-02 MED ORDER — SODIUM CHLORIDE 0.9% FLUSH
3.0000 mL | Freq: Two times a day (BID) | INTRAVENOUS | Status: DC
Start: 2022-07-02 — End: 2022-07-03
  Administered 2022-07-02: 3 mL via INTRAVENOUS

## 2022-07-02 MED ORDER — HEPARIN (PORCINE) IN NACL 1000-0.9 UT/500ML-% IV SOLN
INTRAVENOUS | Status: AC
  Filled 2022-07-02: qty 500

## 2022-07-02 MED ORDER — HEPARIN (PORCINE) IN NACL 1000-0.9 UT/500ML-% IV SOLN
INTRAVENOUS | Status: DC | PRN
  Administered 2022-07-02: 500 mL

## 2022-07-02 MED ORDER — TORSEMIDE 100 MG PO TABS
100.0000 mg | ORAL_TABLET | Freq: Two times a day (BID) | ORAL | Status: DC
  Administered 2022-07-02 – 2022-07-04 (×5): 100 mg via ORAL
  Filled 2022-07-02 (×5): qty 1

## 2022-07-02 MED ORDER — SODIUM CHLORIDE 0.9 % IV SOLN: 250.0000 mL | INTRAVENOUS | Status: DC | PRN

## 2022-07-02 MED ORDER — LIDOCAINE HCL (PF) 1 % IJ SOLN
INTRAMUSCULAR | Status: AC
  Filled 2022-07-02: qty 30

## 2022-07-02 SURGICAL SUPPLY — 6 items
CATH BALLN WEDGE 5F 110CM (CATHETERS) ×1 IMPLANT
GUIDEWIRE .025 260CM (WIRE) ×1 IMPLANT
PACK CARDIAC CATHETERIZATION (CUSTOM PROCEDURE TRAY) ×2 IMPLANT
SHEATH GLIDE SLENDER 4/5FR (SHEATH) ×1 IMPLANT
SHEATH PROBE COVER 6X72 (BAG) ×1 IMPLANT
TRANSDUCER W/STOPCOCK (MISCELLANEOUS) ×2 IMPLANT

## 2022-07-02 NOTE — Interval H&P Note (Signed)
History and Physical Interval Note:  07/02/2022 11:20 AM  [name]  has presented today for surgery, with the diagnosis of heart failure.  The various methods of treatment have been discussed with the patient and family. After consideration of risks, benefits and other options for treatment, the patient has consented to  Procedure(s): RIGHT HEART CATH (N/A) as a surgical intervention.  The patient's history has been reviewed, patient examined, no change in status, stable for surgery.  I have reviewed the patient's chart and labs.  Questions were answered to the patient's satisfaction.     Rita [name] [employer]

## 2022-07-02 NOTE — Progress Notes (Signed)
Orthopedic Tech Progress Note Patient Details:  Rita [name] [birth year] [id number]  Patient stated while RN was trying to remove both UNNA BOOTS at the same time she nit/cut her LLE with the scissors, placed a small bandage on leg then I applied UNNA BOOTS to patient      Ortho Devices Type of Ortho Device: Haematologist Ortho Device/Splint Location: BLE Ortho Device/Splint Interventions: Ordered, Application   Post Interventions Patient Tolerated: Well Instructions Provided: Care of device  Janit Pagan 07/02/2022, 8:48 AM

## 2022-07-02 NOTE — Progress Notes (Signed)
Mobility Specialist Progress Note:   07/02/22 1129  Mobility  Activity Off unit   Will follow-up as time allows.   [facility] Rita [name] Mobility Specialist

## 2022-07-02 NOTE — H&P (View-Only) (Signed)
Advanced Heart Failure Rounding Note  PCP-Cardiologist: None   Subjective:    Lasix gtt stopped 06/30/22. Milrinone gtt stopped yesterday.  ReDS clip 41% yesterday. Torsemide 40mg  and acetazolamide 250mg  x2 yesterday (HCO3 >45, today 43).   Weight stable. Poor output documented per pt she has had good UOP, down 3lbs since yesterday. Total -17lbs.  Scr 3.66 -> 3.50  -> 3.18-> 3.36-> 3.37  Feels good this am. Finished working with cardiac rehab. Denies SOB, orthopnea or PND.   Objective:   Weight Range: 85.4 kg Body mass index is 35.57 kg/m.   Vital Signs:   Temp:  [97.9 F (36.6 C)-98.8 F (37.1 C)] 98.3 F (36.8 C) (07/25 0757) Pulse Rate:  [63-78] 73 (07/25 0757) Resp:  [16-18] 16 (07/25 0757) BP: (112-130)/(55-73) 117/55 (07/25 0757) SpO2:  [95 %-98 %] 95 % (07/25 0757) Weight:  [85.4 kg] 85.4 kg (07/25 0320) Last BM Date : 07/01/22  Weight change: Filed Weights   06/30/22 0600 07/01/22 0129 07/02/22 0320  Weight: 87 kg 86.6 kg 85.4 kg    Intake/Output:   Intake/Output Summary (Last 24 hours) at 07/02/2022 0803 Last data filed at 07/01/2022 2100 Gross per 24 hour  Intake 490.04 ml  Output 2 ml  Net 488.04 ml      Physical Exam    General:  chronically ill appearing. Up in chair. No respiratory difficulty HEENT: normal Neck: supple. JVD ~12 cm. Carotids 2+ bilat; no bruits. No lymphadenopathy or thyromegaly appreciated. Cor: PMI nondisplaced. irregular rate & rhythm. No rubs, gallops or murmurs. Lungs: clear Abdomen: soft, nontender, nondistended. No hepatosplenomegaly. No bruits or masses. Good bowel sounds. Extremities: no cyanosis, clubbing, rash,  tr edema  + UNNA Neuro: alert & oriented x 3, cranial nerves grossly intact. moves all 4 extremities w/o difficulty. Affect pleasant.   Telemetry   A fib in 70's (Personally reviewed)   Labs    CBC Recent Labs    06/30/22 0648 07/01/22 0601  WBC 4.7 5.1  HGB 8.5* 8.2*  HCT 25.5* 24.5*  MCV  84.4 85.1  PLT 139* 161*   Basic Metabolic Panel Recent Labs    07/01/22 0601 07/02/22 0208  NA 140 139  K 3.5 3.6  CL 84* 82*  CO2 >45* 43*  GLUCOSE 103* 116*  BUN 83* 75*  CREATININE 3.36* 3.37*  CALCIUM 9.2 9.5   Liver Function Tests No results for input(s): "AST", "ALT", "ALKPHOS", "BILITOT", "PROT", "ALBUMIN" in the last 72 hours.  No results for input(s): "LIPASE", "AMYLASE" in the last 72 hours. Cardiac Enzymes No results for input(s): "CKTOTAL", "CKMB", "CKMBINDEX", "TROPONINI" in the last 72 hours.   BNP: BNP (last 3 results) Recent Labs    05/28/22 0935 06/19/22 1059 06/26/22 1212  BNP 722.5* 688.3* 999.4*    ProBNP (last 3 results) No results for input(s): "PROBNP" in the last 8760 hours.   D-Dimer No results for input(s): "DDIMER" in the last 72 hours. Hemoglobin A1C No results for input(s): "HGBA1C" in the last 72 hours.  Fasting Lipid Panel No results for input(s): "CHOL", "HDL", "LDLCALC", "TRIG", "CHOLHDL", "LDLDIRECT" in the last 72 hours. Thyroid Function Tests No results for input(s): "TSH", "T4TOTAL", "T3FREE", "THYROIDAB" in the last 72 hours.  Invalid input(s): "FREET3"   Other results:   Imaging    No results found.   Medications:     Scheduled Medications:  aspirin EC  81 mg Oral Daily   carvedilol  3.125 mg Oral BID WC   darbepoetin (ARANESP)  injection - NON-DIALYSIS  100 mcg Subcutaneous Q Mon-1800   enoxaparin (LOVENOX) injection  30 mg Subcutaneous Q24H   hydrocerin   Topical BID   insulin aspart  0-5 Units Subcutaneous QHS   insulin aspart  0-9 Units Subcutaneous TID WC   lactose free nutrition  237 mL Oral TID WC   pantoprazole  40 mg Oral Q1200   potassium chloride  20 mEq Oral BID   sodium chloride flush  3 mL Intravenous Q12H   sodium chloride flush  3 mL Intravenous Q12H    Infusions:  sodium chloride     sodium chloride     sodium chloride 10 mL/hr at 07/02/22 0727   ferric gluconate (FERRLECIT) IVPB  250 mg (07/01/22 1143)    PRN Medications: sodium chloride, sodium chloride, acetaminophen **OR** acetaminophen, albuterol, bisacodyl, bisacodyl, HYDROcodone-acetaminophen, lidocaine (PF), polyethylene glycol, sodium chloride flush, sodium chloride flush    Patient Profile   Rita [name] is a 66 y.o. 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 Here for Acute / Chronic diastolic HF with marked volume overload.  Assessment/Plan   1.  Acute / Chronic diastolic HF  - Prominent signs of RV failure.  9/21 echo showed EF 40-45%, global hypokinesis, D-shaped IV septum suggestive of RV pressure/volume overload, RV poorly visualized but appeared normal in size with at least mild systolic dysfunction. Cause of cardiomyopathy is uncertain.  She has history of COVID-19 1/21, cannot rule out viral myocarditis. Chemotherapy-mediated CMP is possible (not sure what chemo she had remotely for breast cancer).  Cardiac MRI showed LV EF 41% with diffuse hypokinesis, RV mildly dilated but with EF 27% and D-shaped septum, nonspecific RV insertion site LGE.  Suspect RV > LV failure. Cardiac MRI is not suggestive of amyloidosis, no M-spike on myeloma panel. We have not ruled out CAD (cath deferred given lack of ischemic CP and CKD). Milrinone started primarily for RV support during admission in 9/21 and titrated off. She had cardiomems placed 1/22. Echo in 1/23 showed improved LV function, EF 55-60%, mild LVH, mild RV enlargement with normal systolic function, IVC dilated, PASP 68 mmHg. NYHA  class III-IIIb symptoms, functional capacity limited by body habitus as well as CHF.  - BNP 999, HsTrop flat 137>>144>>155>>161, Cr 3.56, CXR: Increased right basilar opacity is noted most consistent with subsegmental atelectasis or asymmetric edema with associated pleural effusion, CT abdomen pelvis noted cardiomegaly, marked distention of hepatic veins and suprarenal IVC, large right pleural effusion, abdominal wall edema, liver cirrhosis, trace ascites and hypodensity in pancreatic body. - Admitted with marked edema. Cardiomems last 20 (7/18), goal 21 c/w R>L HF - ECHO this admit: LVEF 60-65%, The interventricular septum is flattened in systole and diastole, consistent w/ RV pressure and volume overload. RV mildly enlarged. RV function normal, severaly elevated PASP, LA severely dilated, RA normal size, mital annular calcification, trivial MR, severe TR - Continue carvedilol at 3.125 (home dose 6.25) - Continue holding hydralazine + Imdur  - GFR < 20, not a candidate for SGLT2i. - Volume status improved. Lasix gtt stopped 06/30/22 (pt felt lightheaded). Milrinone stopped yesterday. - ReDS clip yesterday 41% -  RHC today - Acetazolamide 250 BID and torsemide 40 mg yesterday, -3lbs. HCO3 >45, 43 today. Repeat acetazolamide 250 BID and restart home Torsemide 100mg  BID.  - No ARNI/spironolactone with elevated creatinine.  - BLE VAS [place] 7/20: (-) DVT - Daily weights and Strict I&O - Follow BMET - Discussed importance of following a low-salt diet and monitoring fluid intake.    2. Chronic hypoxemic respiratory failure: OHS/OSA.  She was using home oxygen prior to COVID-19 PNA.  - Not currently wearing CPAP at home, has hospital one at bedside but refuses to use overnight d/t sudden headache per patient, addiment about not wanting to try this one again. - Stable on O2 via [place]   3. Atrial fibrillation: Chronic, all ECGs since 7/21 show atrial fibrillation.  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 Rate is controlled. No overt bleeding.  - She cannot be cardioverted at this point as she cannot be chronically anticoagulated. - Evaluated by Dr. Quentin Ore for [place] but not a suitable candidate given multiple severe medical comorbidities and risks associated with general anesthesia. - Stable   4. HTN - stable   5. Hx of GI bleeding: Recurrent, from small bowel AVMs. Unable to be anticoagulated. No recent overt bleeding.  - Stable hgb recently.    6. H/o DVT/PE: Provoked (post-surgery). No longer on anticoagulation due to h/o significant GIB.  - Has IVC filter.    7. AKI on CKD Stage IV.  - Daily BMET, Cr. 3.66 -> 3.5 -> 3.18-> 3.36-> 3.37.  baseline 2.6 - Denies NSAID use - Renal US 06/27/22: Unremarkable renal ultrasound. - not HD candidate per Renal - lasix gtt stopped yesterday   8. Hypokalemia - K 3.6 - Daily BMET - supp as needed    9. Right pleural effusion - s/p thoracentesis (last 08/31/20) (d/t CHF)  - CT scan this admit with large right pleural effusion - thoracentesis 06/27/22- Yielded 1.2 L of hazy amber fluid, cultures NGTD   10. DM II - continue SSI - Hgb A1c 7   11. Anemia - Suspect due to chronic low grade GI bleeding (small bowel AVMs). - Daily CBC - Hgb today 8.2. Baseline 8-9 - Iron profile stable: tSat 22, ferritin 245.    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 No focal abnormality on this limited unenhanced exam. No evidence of cholelithiasis or cholecystitis. - Denies ETOH use - Hep A,B,C (-) - AST/ALT stable - Ammonia 44, tBili 1.9 - GI saw and signed off: they would not do further serologic evaluation other than those studies pending i.e. hepatitis serologies and ANA. Eventually would consider EGD.   13. Thrombocytopenia  - chronic - monitor for bleeding  14. NSVT - Keep K > 4.0 Mg > 2,0  - Daily BMET  15. Enlarged  thyroid - Thyroid US 06/27/20: 1.5 cm left mid thyroid TR 3 nodule - TSH was WNL .592  Length of Stay: [address] AGACNP-BC  8:03 AM  07/02/2022, 8:03 AM  Advanced Heart Failure Team Pager [phone] (M-F; 7a - 5p)  Please contact [facility] for night-coverage after hours (5p -7a ) and weekends on [email or web address]   Patient seen with NP, agree with the above note.   I/Os not documented but weight is down.  Creatinine stable at 3.37 with lower BUN.  HCO2 lower at 43.  No dyspnea at rest, says she walked in the  hall.   General: NAD Neck: JVP difficult, no thyromegaly or thyroid nodule.  Lungs: Clear to auscultation bilaterally with normal respiratory effort. CV: Nondisplaced PMI.  Heart irregular S1/S2, no S3/S4, no murmur.  Legs wrapped.   Abdomen: Soft, nontender, no hepatosplenomegaly, no distention.  Skin: Intact without lesions or rashes.  Neurologic: Alert and oriented x 3.  Psych: Normal affect. Extremities: No clubbing or cyanosis.  HEENT: Normal.   1.  Acute / Chronic diastolic HF: Prominent signs of RV failure.  9/21 echo showed EF 40-45%, global hypokinesis, D-shaped IV septum suggestive of RV pressure/volume overload, RV poorly visualized but appeared normal in size with at least mild systolic dysfunction. Cause of cardiomyopathy is uncertain.  She has history of COVID-19 1/21, cannot rule out viral myocarditis. Chemotherapy-mediated CMP is possible (not sure what chemo she had remotely for breast cancer).  Cardiac MRI showed LV EF 41% with diffuse hypokinesis, RV mildly dilated but with EF 27% and D-shaped septum, nonspecific RV insertion site LGE.  Suspect RV > LV failure. Cardiac MRI is not suggestive of amyloidosis, no M-spike on myeloma panel. We have not ruled out CAD (cath deferred given lack of ischemic CP and CKD). Milrinone started primarily for RV support during admission in 9/21 and titrated off. She had cardiomems placed 1/22, goal has been 21 mmHg. Echo in 1/23  showed improved LV function, EF 55-60%, mild LVH, mild RV enlargement with normal systolic function, IVC dilated, PASP 68 mmHg. Admitted this time with CHF exacerbation and AKI. Hs-TnI mildly elevated with no trend, consistent with demand ischemia due to volume overload. Echo this admit with LVEF 60-65%, interventricular septum flattened in systole and diastole, consistent w/ RV pressure and volume overload, RV mildly enlarged, RV function normal, severaly elevated PASP, LA severely dilated, trivial MR, severe TR. She has been diuresed this admission with weight down. Initially on milrinone for RV support, now off. Creatinine stable today at 3.37. REDS clip yesterday was still 41%. Volume status difficult to discern on exam though suspect still some volume overload.  - Continue carvedilol at 3.125 (home dose 6.25) - Continue holding hydralazine + Imdur, BP not elevated.  - GFR < 20, not a candidate for SGLT2i. - RHC today to assess filling pressures. Discussed risks/benefits with patient and she agrees to procedure.  - At home, she is on torsemide 100 mg bid.  Will tentatively start this today but may need to change plan based on RHC.  - Will give acetazolamide 250 mg bid x 2 doses again today, HCO3 43 (lower than yesterday).   - No ARNI/spironolactone with elevated creatinine.  2. Chronic hypoxemic respiratory failure: OHS/OSA.  She was using home oxygen prior to COVID-19 PNA.  - Not currently wearing CPAP at home, has hospital one at bedside but refuses to use overnight d/t sudden headache per patient, wants to get back on her home CPAP which has been taken from her due to noncompliance => will have to address this as outpatient. - Stable on O2 via [place] 3. Atrial fibrillation: Chronic, all ECGs since 7/21 show atrial fibrillation.  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 Rate is controlled. No overt bleeding.  - She cannot be cardioverted at this point as she  cannot be chronically anticoagulated. - Evaluated by Dr. Quentin Ore for [place] but not a suitable candidate given multiple severe medical comorbidities and risks associated with general anesthesia. 4. HTN: BP stable on current regimen. Can add back hydralazine  if BP rises.  5. Hx of GI bleeding: Recurrent, from small bowel AVMs. Unable to be anticoagulated. No recent overt bleeding. Hgb has been stable.  6. H/o DVT/PE: Provoked (post-surgery). No longer on anticoagulation due to h/o significant GIB.  Has IVC filter.  7. AKI on CKD Stage IV: Peak creatinine 3.66, 3.37 today.  [place] had been 2.6.  Denies NSAID use.  Unremarkable renal US.  - Would be difficult HD candidate with RV failure.  8. Right pleural effusion: CT scan this admit with large right pleural effusion.  Thoracentesis 06/27/22- Yielded 1.2 L of hazy amber fluid, cultures NGTD.  9. DM II - continue SSI 10. Anemia: Suspect due to chronic low grade GI bleeding (small bowel AVMs).  Had had feraheme.  - Daily CBC 11. Cardiac cirrhosis: CT showed nodular contour of the liver capsule consistent with cirrhosis. No focal abnormality on this limited unenhanced exam. Denies ETOH use.  Hep A,B,C (-).  GI saw and signed off, followup as outpatient.  12. Thrombocytopenia: Chronic, mild.  May be due to cirrhosis.  13. NSVT - Keep K > 4.0 Mg > 2,0  - Daily BMET 14. Enlarged thyroid: Thyroid US 06/27/20: 1.5 cm left mid thyroid TR 3 nodule. TSH was WNL .[place]. 8:50 AM

## 2022-07-02 NOTE — Progress Notes (Signed)
Mobility Specialist - Progress Note   07/02/22 1400  Mobility  Activity Ambulated with assistance in hallway  Level of Assistance Modified independent, requires aide device or extra time  Assistive Device Four wheel walker  Distance Ambulated (ft) 120 ft  Activity Response Tolerated well  $Mobility charge 1 Mobility   Pt received in bed and agreeable to mobility. Pt took two breaks during walk in hallway, one seated and one standing as a result of SOB. Pt left in chair with call light and all needs met.   Sydney Joyce Mobility Specialist  

## 2022-07-02 NOTE — Progress Notes (Signed)
Occupational Therapy Treatment Patient Details Name: Rita [name] MRN: [id number] DOB: [birth year] Today's Date: 07/02/2022   History of present illness 66 y.o. F admitted on 06/26/22 due to increased O2 needs and swelling. PMH significant for diabetes mellitus diastolic CHF chronic respiratory failure on 1 to 2 L of oxygen anemia, atrial fibrillation, CKD stage III hypertension thrombocytopenia GI bleed.   OT comments  Patient received in supine and agreeable to OT session. Patient able to get to EOB without assistance and able to ambulate to sink with RW and min guard assist for safety. Patient performed grooming, bathing, and UB dressing seated/standing at sink with supervision to min guard assist. Static standing performed from recliner with min guard assist and patient tolerating 1.5 minutes of standing before requiring seated rest break. Acute OT to continue to follow.    Recommendations for follow up therapy are one component of a multi-disciplinary discharge planning process, led by the attending physician.  Recommendations may be updated based on patient status, additional functional criteria and insurance authorization.    Follow Up Recommendations  Home health OT    Assistance Recommended at Discharge Set up Supervision/Assistance  Patient can return home with the following  A little help with walking and/or transfers;A little help with bathing/dressing/bathroom;Assistance with cooking/housework   Equipment Recommendations  None recommended by OT    Recommendations for Other Services      Precautions / Restrictions Precautions Precautions: Fall Restrictions Weight Bearing Restrictions: No       Mobility Bed Mobility Overal bed mobility: Modified Independent             General bed mobility comments: able to get to EOB without assistance    Transfers Overall transfer level: Needs assistance Equipment used: Rolling walker (2 wheels) Transfers: Sit  to/from Stand Sit to Stand: Supervision     Step pivot transfers: Min guard     General transfer comment: min guard due to lines and safety     Balance Overall balance assessment: Needs assistance Sitting-balance support: No upper extremity supported, Feet supported Sitting balance-Leahy Scale: Good     Standing balance support: Bilateral upper extremity supported, During functional activity, Reliant on assistive device for balance Standing balance-Leahy Scale: Poor Standing balance comment: performed reaching tasks while standing with min guard assist                           ADL either performed or assessed with clinical judgement   ADL Overall ADL's : Needs assistance/impaired     Grooming: Wash/dry hands;Oral care;Wash/dry face;Supervision/safety;Sitting Grooming Details (indicate cue type and reason): performed in sitting due to complaints of fatigue Upper Body Bathing: Set up;Sitting Upper Body Bathing Details (indicate cue type and reason): at sink Lower Body Bathing: Min guard;Sitting/lateral leans;Sit to/from stand Lower Body Bathing Details (indicate cue type and reason): at sink Upper Body Dressing : Set up;Sitting Upper Body Dressing Details (indicate cue type and reason): gown change                   General ADL Comments: supervision to min guard for self care    Extremity/Trunk Assessment              Vision       Perception     Praxis      Cognition Arousal/Alertness: Awake/alert Behavior During Therapy: WFL for tasks assessed/performed Overall Cognitive Status: Within Functional Limits for tasks assessed  Exercises      Shoulder Instructions       General Comments      Pertinent Vitals/ Pain       Pain Assessment Pain Assessment: No/denies pain Pain Intervention(s): Monitored during session  Home Living                                           Prior Functioning/Environment              Frequency  Min 2X/week        Progress Toward Goals  OT Goals(current goals can now be found in the care plan section)  Progress towards OT goals: Progressing toward goals  Acute Rehab OT Goals Patient Stated Goal: go home OT Goal Formulation: With patient Time For Goal Achievement: 07/11/22 Potential to Achieve Goals: Good ADL Goals Pt Will Perform Grooming: with modified independence;standing Pt Will Perform Lower Body Bathing: with modified independence;sitting/lateral leans;sit to/from stand Pt Will Perform Lower Body Dressing: with modified independence;sitting/lateral leans;sit to/from stand Pt Will Transfer to Toilet: with modified independence;ambulating Pt Will Perform Toileting - Clothing Manipulation and hygiene: with modified independence;sitting/lateral leans;sit to/from stand  Plan Discharge plan remains appropriate    Co-evaluation                 AM-PAC OT "6 Clicks" Daily Activity     Outcome Measure   Help from another person eating meals?: A Little Help from another person taking care of personal grooming?: A Little Help from another person toileting, which includes using toliet, bedpan, or urinal?: A Little Help from another person bathing (including washing, rinsing, drying)?: A Little Help from another person to put on and taking off regular upper body clothing?: A Little Help from another person to put on and taking off regular lower body clothing?: A Little 6 Click Score: 18    End of Session Equipment Utilized During Treatment: Rolling walker (2 wheels);Oxygen  OT Visit Diagnosis: Unsteadiness on feet (R26.81);Other abnormalities of gait and mobility (R26.89);Muscle weakness (generalized) (M62.81)   Activity Tolerance Patient tolerated treatment well   Patient Left in chair;with call bell/phone within reach   Nurse Communication Mobility status        Time: 3500-9381 OT  Time Calculation (min): 28 min  Charges: OT General Charges $OT Visit: 1 Visit OT Treatments $Self Care/Home Management : 8-22 mins $Therapeutic Activity: 8-22 mins  Lodema Hong, [place]  Office [phone]   Trixie Dredge 07/02/2022, 10:00 AM

## 2022-07-02 NOTE — Progress Notes (Addendum)
Advanced Heart Failure Rounding Note  PCP-Cardiologist: None   Subjective:    Lasix gtt stopped 06/30/22. Milrinone gtt stopped yesterday.  ReDS clip 41% yesterday. Torsemide 40mg  and acetazolamide 250mg  x2 yesterday (HCO3 >45, today 43).   Weight stable. Poor output documented per pt she has had good UOP, down 3lbs since yesterday. Total -17lbs.  Scr 3.66 -> 3.50  -> 3.18-> 3.36-> 3.37  Feels good this am. Finished working with cardiac rehab. Denies SOB, orthopnea or PND.   Objective:   Weight Range: 85.4 kg Body mass index is 35.57 kg/m.   Vital Signs:   Temp:  [97.9 F (36.6 C)-98.8 F (37.1 C)] 98.3 F (36.8 C) (07/25 0757) Pulse Rate:  [63-78] 73 (07/25 0757) Resp:  [16-18] 16 (07/25 0757) BP: (112-130)/(55-73) 117/55 (07/25 0757) SpO2:  [95 %-98 %] 95 % (07/25 0757) Weight:  [85.4 kg] 85.4 kg (07/25 0320) Last BM Date : 07/01/22  Weight change: Filed Weights   06/30/22 0600 07/01/22 0129 07/02/22 0320  Weight: 87 kg 86.6 kg 85.4 kg    Intake/Output:   Intake/Output Summary (Last 24 hours) at 07/02/2022 0803 Last data filed at 07/01/2022 2100 Gross per 24 hour  Intake 490.04 ml  Output 2 ml  Net 488.04 ml      Physical Exam    General:  chronically ill appearing. Up in chair. No respiratory difficulty HEENT: normal Neck: supple. JVD ~12 cm. Carotids 2+ bilat; no bruits. No lymphadenopathy or thyromegaly appreciated. Cor: PMI nondisplaced. irregular rate & rhythm. No rubs, gallops or murmurs. Lungs: clear Abdomen: soft, nontender, nondistended. No hepatosplenomegaly. No bruits or masses. Good bowel sounds. Extremities: no cyanosis, clubbing, rash,  tr edema  + UNNA Neuro: alert & oriented x 3, cranial nerves grossly intact. moves all 4 extremities w/o difficulty. Affect pleasant.   Telemetry   A fib in 70's (Personally reviewed)   Labs    CBC Recent Labs    06/30/22 0648 07/01/22 0601  WBC 4.7 5.1  HGB 8.5* 8.2*  HCT 25.5* 24.5*  MCV  84.4 85.1  PLT 139* 614*   Basic Metabolic Panel Recent Labs    07/01/22 0601 07/02/22 0208  NA 140 139  K 3.5 3.6  CL 84* 82*  CO2 >45* 43*  GLUCOSE 103* 116*  BUN 83* 75*  CREATININE 3.36* 3.37*  CALCIUM 9.2 9.5   Liver Function Tests No results for input(s): "AST", "ALT", "ALKPHOS", "BILITOT", "PROT", "ALBUMIN" in the last 72 hours.  No results for input(s): "LIPASE", "AMYLASE" in the last 72 hours. Cardiac Enzymes No results for input(s): "CKTOTAL", "CKMB", "CKMBINDEX", "TROPONINI" in the last 72 hours.   BNP: BNP (last 3 results) Recent Labs    05/28/22 0935 06/19/22 1059 06/26/22 1212  BNP 722.5* 688.3* 999.4*    ProBNP (last 3 results) No results for input(s): "PROBNP" in the last 8760 hours.   D-Dimer No results for input(s): "DDIMER" in the last 72 hours. Hemoglobin A1C No results for input(s): "HGBA1C" in the last 72 hours.  Fasting Lipid Panel No results for input(s): "CHOL", "HDL", "LDLCALC", "TRIG", "CHOLHDL", "LDLDIRECT" in the last 72 hours. Thyroid Function Tests No results for input(s): "TSH", "T4TOTAL", "T3FREE", "THYROIDAB" in the last 72 hours.  Invalid input(s): "FREET3"   Other results:   Imaging    No results found.   Medications:     Scheduled Medications:  aspirin EC  81 mg Oral Daily   carvedilol  3.125 mg Oral BID WC   darbepoetin (ARANESP)  injection - NON-DIALYSIS  100 mcg Subcutaneous Q Mon-1800   enoxaparin (LOVENOX) injection  30 mg Subcutaneous Q24H   hydrocerin   Topical BID   insulin aspart  0-5 Units Subcutaneous QHS   insulin aspart  0-9 Units Subcutaneous TID WC   lactose free nutrition  237 mL Oral TID WC   pantoprazole  40 mg Oral Q1200   potassium chloride  20 mEq Oral BID   sodium chloride flush  3 mL Intravenous Q12H   sodium chloride flush  3 mL Intravenous Q12H    Infusions:  sodium chloride     sodium chloride     sodium chloride 10 mL/hr at 07/02/22 0727   ferric gluconate (FERRLECIT) IVPB  250 mg (07/01/22 1143)    PRN Medications: sodium chloride, sodium chloride, acetaminophen **OR** acetaminophen, albuterol, bisacodyl, bisacodyl, HYDROcodone-acetaminophen, lidocaine (PF), polyethylene glycol, sodium chloride flush, sodium chloride flush    Patient Profile   Ms Kirshner is a 66 y.o. 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 Here for Acute / Chronic diastolic HF with marked volume overload.  Assessment/Plan   1.  Acute / Chronic diastolic HF  - Prominent signs of RV failure.  9/21 echo showed EF 40-45%, global hypokinesis, D-shaped IV septum suggestive of RV pressure/volume overload, RV poorly visualized but appeared normal in size with at least mild systolic dysfunction. Cause of cardiomyopathy is uncertain.  She has history of COVID-19 1/21, cannot rule out viral myocarditis. Chemotherapy-mediated CMP is possible (not sure what chemo she had remotely for breast cancer).  Cardiac MRI showed LV EF 41% with diffuse hypokinesis, RV mildly dilated but with EF 27% and D-shaped septum, nonspecific RV insertion site LGE.  Suspect RV > LV failure. Cardiac MRI is not suggestive of amyloidosis, no M-spike on myeloma panel. We have not ruled out CAD (cath deferred given lack of ischemic CP and CKD). Milrinone started primarily for RV support during admission in 9/21 and titrated off. She had cardiomems placed 1/22. Echo in 1/23 showed improved LV function, EF 55-60%, mild LVH, mild RV enlargement with normal systolic function, IVC dilated, PASP 68 mmHg. NYHA  class III-IIIb symptoms, functional capacity limited by body habitus as well as CHF.  - BNP 999, HsTrop flat 137>>144>>155>>161, Cr 3.56, CXR: Increased right basilar opacity is noted most consistent with subsegmental atelectasis or asymmetric edema with associated pleural effusion, CT abdomen pelvis noted cardiomegaly, marked distention of hepatic veins and suprarenal IVC, large right pleural effusion, abdominal wall edema, liver cirrhosis, trace ascites and hypodensity in pancreatic body. - Admitted with marked edema. Cardiomems last 20 (7/18), goal 21 c/w R>L HF - ECHO this admit: LVEF 60-65%, The interventricular septum is flattened in systole and diastole, consistent w/ RV pressure and volume overload. RV mildly enlarged. RV function normal, severaly elevated PASP, LA severely dilated, RA normal size, mital annular calcification, trivial MR, severe TR - Continue carvedilol at 3.125 (home dose 6.25) - Continue holding hydralazine + Imdur  - GFR < 20, not a candidate for SGLT2i. - Volume status improved. Lasix gtt stopped 06/30/22 (pt felt lightheaded). Milrinone stopped yesterday. - ReDS clip yesterday 41% -  RHC today - Acetazolamide 250 BID and torsemide 40 mg yesterday, -3lbs. HCO3 >45, 43 today. Repeat acetazolamide 250 BID and restart home Torsemide 100mg  BID.  - No ARNI/spironolactone with elevated creatinine.  - BLE VAS [place] 7/20: (-) DVT - Daily weights and Strict I&O - Follow BMET - Discussed importance of following a low-salt diet and monitoring fluid intake.    2. Chronic hypoxemic respiratory failure: OHS/OSA.  She was using home oxygen prior to COVID-19 PNA.  - Not currently wearing CPAP at home, has hospital one at bedside but refuses to use overnight d/t sudden headache per patient, addiment about not wanting to try this one again. - Stable on O2 via [place]   3. Atrial fibrillation: Chronic, all ECGs since 7/21 show atrial fibrillation.  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 Rate is controlled. No overt bleeding.  - She cannot be cardioverted at this point as she cannot be chronically anticoagulated. - Evaluated by Dr. Quentin Ore for [place] but not a suitable candidate given multiple severe medical comorbidities and risks associated with general anesthesia. - Stable   4. HTN - stable   5. Hx of GI bleeding: Recurrent, from small bowel AVMs. Unable to be anticoagulated. No recent overt bleeding.  - Stable hgb recently.    6. H/o DVT/PE: Provoked (post-surgery). No longer on anticoagulation due to h/o significant GIB.  - Has IVC filter.    7. AKI on CKD Stage IV.  - Daily BMET, Cr. 3.66 -> 3.5 -> 3.18-> 3.36-> 3.37.  baseline 2.6 - Denies NSAID use - Renal US 06/27/22: Unremarkable renal ultrasound. - not HD candidate per Renal - lasix gtt stopped yesterday   8. Hypokalemia - K 3.6 - Daily BMET - supp as needed    9. Right pleural effusion - s/p thoracentesis (last 08/31/20) (d/t CHF)  - CT scan this admit with large right pleural effusion - thoracentesis 06/27/22- Yielded 1.2 L of hazy amber fluid, cultures NGTD   10. DM II - continue SSI - Hgb A1c 7   11. Anemia - Suspect due to chronic low grade GI bleeding (small bowel AVMs). - Daily CBC - Hgb today 8.2. Baseline 8-9 - Iron profile stable: tSat 22, ferritin 245.    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 No focal abnormality on this limited unenhanced exam. No evidence of cholelithiasis or cholecystitis. - Denies ETOH use - Hep A,B,C (-) - AST/ALT stable - Ammonia 44, tBili 1.9 - GI saw and signed off: they would not do further serologic evaluation other than those studies pending i.e. hepatitis serologies and ANA. Eventually would consider EGD.   13. Thrombocytopenia  - chronic - monitor for bleeding  14. NSVT - Keep K > 4.0 Mg > 2,0  - Daily BMET  15. Enlarged  thyroid - Thyroid US 06/27/20: 1.5 cm left mid thyroid TR 3 nodule - TSH was WNL .592  Length of Stay: [address], AGACNP-BC  8:03 AM  07/02/2022, 8:03 AM  Advanced Heart Failure Team Pager [phone] (M-F; 7a - 5p)  Please contact [facility] for night-coverage after hours (5p -7a ) and weekends on [email or web address]   Patient seen with NP, agree with the above note.   I/Os not documented but weight is down.  Creatinine stable at 3.37 with lower BUN.  HCO2 lower at 43.  No dyspnea at rest, says she walked in the  hall.   General: NAD Neck: JVP difficult, no thyromegaly or thyroid nodule.  Lungs: Clear to auscultation bilaterally with normal respiratory effort. CV: Nondisplaced PMI.  Heart irregular S1/S2, no S3/S4, no murmur.  Legs wrapped.   Abdomen: Soft, nontender, no hepatosplenomegaly, no distention.  Skin: Intact without lesions or rashes.  Neurologic: Alert and oriented x 3.  Psych: Normal affect. Extremities: No clubbing or cyanosis.  HEENT: Normal.   1.  Acute / Chronic diastolic HF: Prominent signs of RV failure.  9/21 echo showed EF 40-45%, global hypokinesis, D-shaped IV septum suggestive of RV pressure/volume overload, RV poorly visualized but appeared normal in size with at least mild systolic dysfunction. Cause of cardiomyopathy is uncertain.  She has history of COVID-19 1/21, cannot rule out viral myocarditis. Chemotherapy-mediated CMP is possible (not sure what chemo she had remotely for breast cancer).  Cardiac MRI showed LV EF 41% with diffuse hypokinesis, RV mildly dilated but with EF 27% and D-shaped septum, nonspecific RV insertion site LGE.  Suspect RV > LV failure. Cardiac MRI is not suggestive of amyloidosis, no M-spike on myeloma panel. We have not ruled out CAD (cath deferred given lack of ischemic CP and CKD). Milrinone started primarily for RV support during admission in 9/21 and titrated off. She had cardiomems placed 1/22, goal has been 21 mmHg. Echo in 1/23  showed improved LV function, EF 55-60%, mild LVH, mild RV enlargement with normal systolic function, IVC dilated, PASP 68 mmHg. Admitted this time with CHF exacerbation and AKI. Hs-TnI mildly elevated with no trend, consistent with demand ischemia due to volume overload. Echo this admit with LVEF 60-65%, interventricular septum flattened in systole and diastole, consistent w/ RV pressure and volume overload, RV mildly enlarged, RV function normal, severaly elevated PASP, LA severely dilated, trivial MR, severe TR. She has been diuresed this admission with weight down. Initially on milrinone for RV support, now off. Creatinine stable today at 3.37. REDS clip yesterday was still 41%. Volume status difficult to discern on exam though suspect still some volume overload.  - Continue carvedilol at 3.125 (home dose 6.25) - Continue holding hydralazine + Imdur, BP not elevated.  - GFR < 20, not a candidate for SGLT2i. - RHC today to assess filling pressures. Discussed risks/benefits with patient and she agrees to procedure.  - At home, she is on torsemide 100 mg bid.  Will tentatively start this today but may need to change plan based on RHC.  - Will give acetazolamide 250 mg bid x 2 doses again today, HCO3 43 (lower than yesterday).   - No ARNI/spironolactone with elevated creatinine.  2. Chronic hypoxemic respiratory failure: OHS/OSA.  She was using home oxygen prior to COVID-19 PNA.  - Not currently wearing CPAP at home, has hospital one at bedside but refuses to use overnight d/t sudden headache per patient, wants to get back on her home CPAP which has been taken from her due to noncompliance => will have to address this as outpatient. - Stable on O2 via [place] 3. Atrial fibrillation: Chronic, all ECGs since 7/21 show atrial fibrillation.  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 Rate is controlled. No overt bleeding.  - She cannot be cardioverted at this point as she  cannot be chronically anticoagulated. - Evaluated by Dr. Quentin Ore for [place] but not a suitable candidate given multiple severe medical comorbidities and risks associated with general anesthesia. 4. HTN: BP stable on current regimen. Can add back hydralazine  if BP rises.  5. Hx of GI bleeding: Recurrent, from small bowel AVMs. Unable to be anticoagulated. No recent overt bleeding. Hgb has been stable.  6. H/o DVT/PE: Provoked (post-surgery). No longer on anticoagulation due to h/o significant GIB.  Has IVC filter.  7. AKI on CKD Stage IV: Peak creatinine 3.66, 3.37 today.  [place] had been 2.6.  Denies NSAID use.  Unremarkable renal US.  - Would be difficult HD candidate with RV failure.  8. Right pleural effusion: CT scan this admit with large right pleural effusion.  Thoracentesis 06/27/22- Yielded 1.2 L of hazy amber fluid, cultures NGTD.  9. DM II - continue SSI 10. Anemia: Suspect due to chronic low grade GI bleeding (small bowel AVMs).  Had had feraheme.  - Daily CBC 11. Cardiac cirrhosis: CT showed nodular contour of the liver capsule consistent with cirrhosis. No focal abnormality on this limited unenhanced exam. Denies ETOH use.  Hep A,B,C (-).  GI saw and signed off, followup as outpatient.  12. Thrombocytopenia: Chronic, mild.  May be due to cirrhosis.  13. NSVT - Keep K > 4.0 Mg > 2,0  - Daily BMET 14. Enlarged thyroid: Thyroid US 06/27/20: 1.5 cm left mid thyroid TR 3 nodule. TSH was WNL .[place]. 8:50 AM

## 2022-07-02 NOTE — Progress Notes (Signed)
Nutrition Brief Note  Consult received for HF/low sodium diet education. RD has been following pt during admission. Pt previously reports following low sodium diet at home.   "Heart Failure Nutrition Therapy" handout added to AVS.   Nutrition supplements in place. Meal completions throughout admission noted to be 75-100%. Pt is NPO for RHC today.   Will continue to follow up as appropriate.   Clayborne Dana, RDN, LDN Clinical Nutrition

## 2022-07-02 NOTE — Progress Notes (Signed)
PROGRESS NOTE    Rita [name]  SWN:[id number] DOB: 08-30-1956 DOA: 06/26/2022 PCP: [name], MD  66/F with history of chronic systolic CHF, EF recovered, CKD 4, history of chronic GI bleed/SB AVMs, paroxysmal A-fib not on anticoagulation, type 2 diabetes mellitus, history of DVT/PE, IVC filter, chronic respiratory failure on 2 L home O2, obesity presented to the ED with worsening edema, abdominal distention.  Seen in clinic a week ago, torsemide dose increased -CT abdomen pelvis noted cardiomegaly, marked distention of hepatic veins and suprarenal IVC, large right pleural effusion, abdominal wall edema, liver cirrhosis, trace ascites and hypodensity in pancreatic body  -7/20 started Lasix gtt, R Thora -1.2L drained -7/21 starting milrinone, advanced heart failure team and nephrology following -Hospital course with persistent renal insufficiency  Subjective: -Feels better, breathing continues to improve  Assessment and Plan:  Acute on chronic systolic CHF Large right pleural effusion -BiV failure, predominantly right heart failure -EF recovered to 55%, on echo 1/23 -Repeat echo with preserved EF, severe PAH and severe TR -Thoracentesis-1.2 L drained -Advanced heart failure team following, diuresed on Lasix gtt, milrinone -6.4 L negative, weight down 13 LB, mild bump in creatinine -Switched over to oral torsemide yesterday -GDMT limited by CKD 4/AKI, nephrology following, appreciate input -Plan for RHC today  AKI on CKD 4 -Baseline creatinine around 2.6, peaked at 3.6, now 3.3 -Suspect this is cardiorenal, diuretics as above, avoid hypotension -For right heart cath today -Continue milrinone, appreciate nephrology input, not a good HD candidate, recommended palliative care evaluation, will discuss with cardiology -Diuretics as above  Chronic anemia -Secondary to CKD 4 and chronic GI bleed, stable, -Anemia panel more suggestive of chronic  disease  Cirrhosis -Secondary to right heart failure, no history of chronic hepatitis or alcoholism -Diuretics as above, avoid Aldactone with CKD 4  Chronic hypoxic respiratory failure -Reportedly CPAP was taken away from her home recently, -Noncompliant with CPAP, counseled, TOC consult for home CPAP  Chronic A-fib -Rate controlled, Coreg on hold at this time, not on anticoagulation due to recurrent GI bleeds from small bowel AVMs  History of recurrent GI bleeds -History of small bowel AVMs, hemoglobin stable at this time, monitor  History of DVT/PE -Following orthopedic surgery, has IVC filter  Type 2 diabetes mellitus -CBGs are stable, hemoglobin A1c was 7.0  DVT prophylaxis:  Lovenox Code Status: Full code Family Communication: Discussed with patient detail, no family at bedside Disposition Plan: Home likely 2 to 3 days if creatinine improves  Consultants: Cardiology, nephrology   Procedures:   Antimicrobials:    Objective: Vitals:   07/02/22 0757 07/02/22 0831 07/02/22 1107 07/02/22 1155  BP: (!) 117/55 (!) 120/55  104/63  Pulse: 73 67  72  Resp: 16 18  17   Temp: 98.3 F (36.8 C)   98.2 F (36.8 C)  TempSrc: Oral   Oral  SpO2: 95% 93% 94% 95%  Weight:      Height:        Intake/Output Summary (Last 24 hours) at 07/02/2022 1207 Last data filed at 07/02/2022 1047 Gross per 24 hour  Intake 250.04 ml  Output 1000 ml  Net -749.96 ml   Filed Weights   06/30/22 0600 07/01/22 0129 07/02/22 0320  Weight: 87 kg 86.6 kg 85.4 kg    Examination:  General exam: Morbidly obese chronically ill female sitting up in the recliner, AAOx3, no distress HEENT: Positive JVD CVS: S1-S2, irregularly irregular rhythm Lungs: Decreased breath sounds the bases Abdomen: Soft, obese, nontender,  bowel sounds present Extremities: Trace edema, Unna boots on  Skin: No rashes Psychiatry:  Mood & affect appropriate.     Data Reviewed:   CBC: Recent Labs  Lab 06/27/22 0254  06/27/22 0805 06/28/22 0340 06/29/22 0312 06/30/22 0648 07/01/22 0601  WBC 3.7* 3.8* 4.5 4.5 4.7 5.1  NEUTROABS 2.5  --   --   --   --   --   HGB 8.0* 8.3* 8.2* 8.2* 8.5* 8.2*  HCT 23.1* 24.0* 24.0* 24.4* 25.5* 24.5*  MCV 82.8 82.8 82.5 82.4 84.4 85.1  PLT 115* 120* 129* 137* 139* 287*   Basic Metabolic Panel: Recent Labs  Lab 06/27/22 0254 06/27/22 0805 06/27/22 1119 06/28/22 0340 06/29/22 0312 06/30/22 0648 07/01/22 0601 07/02/22 0208  NA  --    < >  --  139 135 139 140 139  K  --    < >  --  3.7 3.2* 3.9 3.5 3.6  CL  --    < >  --  92* 87* 88* 84* 82*  CO2  --    < >  --  35* 36* 40* >45* 43*  GLUCOSE  --    < >  --  125* 138* 114* 103* 116*  BUN  --    < >  --  103* 96* 87* 83* 75*  CREATININE  --    < >  --  3.66* 3.50* 3.18* 3.36* 3.37*  CALCIUM  --    < >  --  9.1 8.7* 9.1 9.2 9.5  MG 2.8*  --  3.0* 2.7*  --   --   --   --   PHOS 5.1*  --  4.8*  --  3.5  --   --   --    < > = values in this interval not displayed.   GFR: Estimated Creatinine Clearance: 15.8 mL/min (A) (by C-G formula based on SCr of 3.37 mg/dL (H)). Liver Function Tests: Recent Labs  Lab 06/27/22 0254 06/28/22 0340 06/29/22 0312  AST 21 20  --   ALT 16 13  --   ALKPHOS 81 79  --   BILITOT 1.9* 1.9*  --   PROT 7.3 6.9  --   ALBUMIN 3.2* 3.1* 3.0*   No results for input(s): "LIPASE", "AMYLASE" in the last 168 hours. Recent Labs  Lab 06/27/22 0254  AMMONIA 44*   Coagulation Profile: Recent Labs  Lab 06/27/22 0254  INR 1.4*   Cardiac Enzymes: Recent Labs  Lab 06/27/22 0254  CKTOTAL 74   BNP (last 3 results) No results for input(s): "PROBNP" in the last 8760 hours. HbA1C: No results for input(s): "HGBA1C" in the last 72 hours.  CBG: Recent Labs  Lab 07/01/22 0740 07/01/22 1124 07/01/22 1717 07/01/22 2107 07/02/22 0648  GLUCAP 110* 148* 137* 136* 107*   Lipid Profile: No results for input(s): "CHOL", "HDL", "LDLCALC", "TRIG", "CHOLHDL", "LDLDIRECT" in the last 72  hours. Thyroid Function Tests: No results for input(s): "TSH", "T4TOTAL", "FREET4", "T3FREE", "THYROIDAB" in the last 72 hours.  Anemia Panel: Recent Labs    06/30/22 0645  FERRITIN 233  TIBC 228*  IRON 52   Urine analysis:    Component Value Date/Time   COLORURINE STRAW (A) 06/26/2022 2036   APPEARANCEUR CLEAR 06/26/2022 2036   LABSPEC 1.006 06/26/2022 2036   PHURINE 5.0 06/26/2022 2036   GLUCOSEU NEGATIVE 06/26/2022 2036   HGBUR NEGATIVE 06/26/2022 2036   BILIRUBINUR NEGATIVE 06/26/2022 2036   [place] NEGATIVE 06/26/2022 2036  PROTEINUR NEGATIVE 06/26/2022 2036   NITRITE NEGATIVE 06/26/2022 2036   LEUKOCYTESUR NEGATIVE 06/26/2022 2036   Sepsis Labs: @LABRCNTIP (procalcitonin:4,lacticidven:4)  ) Recent Results (from the past 240 hour(s))  Culture, body fluid w Gram Stain-bottle     Status: None   Collection Time: 06/27/22  3:02 PM   Specimen: Pleura  Result Value Ref Range Status   Specimen Description PLEURAL  Final   Special Requests RIGHT LUNG  Final   Culture   Final    NO GROWTH 5 DAYS Performed at [facility] [address] [place], [place] [address]    Report Status 07/02/2022 FINAL  Final  Gram stain     Status: None   Collection Time: 06/27/22  3:02 PM   Specimen: Fluid  Result Value Ref Range Status   Specimen Description FLUID  Final   Special Requests RIGHT LUNG  Final   Gram Stain   Final    WBC PRESENT, PREDOMINANTLY MONONUCLEAR NO ORGANISMS SEEN CYTOSPIN SMEAR Performed at [facility], [place] [address] [place], [place] [address]    Report Status 06/27/2022 FINAL  Final     Radiology Studies: CARDIAC CATHETERIZATION  Result Date: 07/02/2022 1. Mildly elevated right and left heart filling pressures. 2. Moderate mixed pulmonary venous/pulmonary arterial (likely due to OHS/OSA) HTN.  3. Preserved cardiac output.     Scheduled Meds:  acetaZOLAMIDE  250 mg Oral BID   aspirin EC  81 mg Oral Daily   carvedilol  3.125 mg Oral  BID WC   darbepoetin (ARANESP) injection - NON-DIALYSIS  100 mcg Subcutaneous Q Mon-1800   enoxaparin (LOVENOX) injection  30 mg Subcutaneous Q24H   hydrocerin   Topical BID   insulin aspart  0-5 Units Subcutaneous QHS   insulin aspart  0-9 Units Subcutaneous TID WC   lactose free nutrition  237 mL Oral TID WC   pantoprazole  40 mg Oral Q1200   potassium chloride  20 mEq Oral BID   sodium chloride flush  3 mL Intravenous Q12H   sodium chloride flush  3 mL Intravenous Q12H   torsemide  100 mg Oral BID   Continuous Infusions:  sodium chloride     ferric gluconate (FERRLECIT) IVPB 250 mg (07/02/22 0828)     LOS: 6 days    Time spent: 69min    Domenic Polite, MD [facility]   07/02/2022, 12:07 PM

## 2022-07-02 NOTE — Progress Notes (Signed)
Pt refuses CPAP 

## 2022-07-02 NOTE — Discharge Instructions (Signed)

## 2022-07-02 NOTE — Progress Notes (Signed)
[facility] Progress Note    Assessment/ Plan:    AKI/CKD stage IV - presumably due to cardiorenal syndrome.  Continue with IV diuresis as per cardiology and follow UOP and daily Scr levels.  No urgent indication for dialysis patients at this time. Given advanced RV failure, would not be a long term dialysis candidate as risks>benefits and she is aware,  Would recommend a palliative consultation if heart failure agrees as well to address GOC--patient is okay with this--will defer to primary service  previously on lasix gtt and milrinone per HF-  but appears no longer. Cr  poor but pretty stable-    BUN down. Having contraction alkalosis on labs, also better today  Renal dose meds for eGFR <15.   Avoid nephrotoxic agents such as IV contrast, NSAIDs/Cox-II I's, phosphate containing bowel preps (FLEETS). 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 Chronic hypoxemic respiratory failure - due to OHS/OSA.  Cpap in hospital however patient continues to refuse Atrial fibrillation -  chronic.  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 HTN - stable H/o DVT/PE - s/p IVC Right pleural effusion - s/p right thoracentesis 7/21 with IR and removed 1.2 Liters Cardiac cirrhosis - GI consulted, signed off. Likely related to chronic congestive hepatopathy  Hypokalemia- replete prn Anemia- iron sat 23 with ferritin 233-  giving iron-  also will give ESA but does not need to continue as OP  I am going to sign off and leave things in the capable hands of advanced HF team since dialysis is not being considered.  I will make sure pt has follow up with Dr. Royce Macadamia as OP -  call with questions   Subjective:   Pt seen-  weight down-  BUN down as well as Co2, crt stable -  plan is for right heart cath today    Objective:   BP (!) 120/55 (BP Location: Left Arm)   Pulse 67   Temp 98.3 F (36.8 C)  (Oral)   Resp 18   Ht $R'5\' 1"'YH$  (1.549 m)   Wt 85.4 kg   SpO2 93%   BMI 35.57 kg/m   Intake/Output Summary (Last 24 hours) at 07/02/2022 0911 Last data filed at 07/02/2022 0800 Gross per 24 hour  Intake 250.04 ml  Output 552 ml  Net -301.96 ml   Weight change: -1.237 kg  Physical Exam: Gen:ill appearing, NAD, sitting up in bed eating breakfast CVS:RRR Resp:diminished air entry bibasilar, normal WOB YIF:OYDXA, soft Ext: does not have significant edema (legs wrapped) Neuro: awake, alert  Imaging: No results found.  Labs: BMET Recent Labs  Lab 06/26/22 1212 06/27/22 0254 06/27/22 0805 06/27/22 1119 06/28/22 0340 06/29/22 0312 06/30/22 0648 07/01/22 0601 07/02/22 0208  NA 135  --  140  --  139 135 139 140 139  K 3.7  --  3.2*  --  3.7 3.2* 3.9 3.5 3.6  CL 91*  --  91*  --  92* 87* 88* 84* 82*  CO2 34*  --  36*  --  35* 36* 40* >45* 43*  GLUCOSE 151*  --  76  --  125* 138* 114* 103* 116*  BUN 109*  --  115*  --  103* 96* 87* 83* 75*  CREATININE 3.68*  --  3.56*  --  3.66* 3.50* 3.18* 3.36* 3.37*  CALCIUM 9.5  --  9.1  --  9.1 8.7* 9.1  9.2 9.5  PHOS  --  5.1*  --  4.8*  --  3.5  --   --   --    CBC Recent Labs  Lab 06/27/22 0254 06/27/22 0805 06/28/22 0340 06/29/22 0312 06/30/22 0648 07/01/22 0601  WBC 3.7*   < > 4.5 4.5 4.7 5.1  NEUTROABS 2.5  --   --   --   --   --   HGB 8.0*   < > 8.2* 8.2* 8.5* 8.2*  HCT 23.1*   < > 24.0* 24.4* 25.5* 24.5*  MCV 82.8   < > 82.5 82.4 84.4 85.1  PLT 115*   < > 129* 137* 139* 148*   < > = values in this interval not displayed.    Medications:     acetaZOLAMIDE  250 mg Oral BID   aspirin EC  81 mg Oral Daily   carvedilol  3.125 mg Oral BID WC   darbepoetin (ARANESP) injection - NON-DIALYSIS  100 mcg Subcutaneous Q Mon-1800   enoxaparin (LOVENOX) injection  30 mg Subcutaneous Q24H   hydrocerin   Topical BID   insulin aspart  0-5 Units Subcutaneous QHS   insulin aspart  0-9 Units Subcutaneous TID WC   lactose free  nutrition  237 mL Oral TID WC   pantoprazole  40 mg Oral Q1200   potassium chloride  20 mEq Oral BID   sodium chloride flush  3 mL Intravenous Q12H   sodium chloride flush  3 mL Intravenous Q12H   torsemide  100 mg Oral BID     Louis Meckel  [facility] 07/02/2022, 9:11 AM \

## 2022-07-03 DIAGNOSIS — Z515 Encounter for palliative care: Secondary | ICD-10-CM

## 2022-07-03 DIAGNOSIS — N189 Chronic kidney disease, unspecified: Secondary | ICD-10-CM

## 2022-07-03 DIAGNOSIS — E440 Moderate protein-calorie malnutrition: Secondary | ICD-10-CM

## 2022-07-03 DIAGNOSIS — Z7189 Other specified counseling: Secondary | ICD-10-CM | POA: Diagnosis not present

## 2022-07-03 DIAGNOSIS — I5033 Acute on chronic diastolic (congestive) heart failure: Secondary | ICD-10-CM | POA: Diagnosis not present

## 2022-07-03 DIAGNOSIS — K7460 Unspecified cirrhosis of liver: Secondary | ICD-10-CM | POA: Diagnosis not present

## 2022-07-03 DIAGNOSIS — N179 Acute kidney failure, unspecified: Secondary | ICD-10-CM | POA: Diagnosis not present

## 2022-07-03 DIAGNOSIS — E1159 Type 2 diabetes mellitus with other circulatory complications: Secondary | ICD-10-CM | POA: Diagnosis not present

## 2022-07-03 LAB — BASIC METABOLIC PANEL
Anion gap: 12 (ref 5–15)
BUN: 72 mg/dL — ABNORMAL HIGH (ref 8–23)
CO2: 41 mmol/L — ABNORMAL HIGH (ref 22–32)
Calcium: 9.2 mg/dL (ref 8.9–10.3)
Chloride: 86 mmol/L — ABNORMAL LOW (ref 98–111)
Creatinine, Ser: 3.53 mg/dL — ABNORMAL HIGH (ref 0.44–1.00)
GFR, Estimated: 14 mL/min — ABNORMAL LOW (ref >60)
Glucose, Bld: 123 mg/dL — ABNORMAL HIGH (ref 70–99)
Potassium: 3.5 mmol/L (ref 3.5–5.1)
Sodium: 139 mmol/L (ref 135–145)

## 2022-07-03 LAB — GLUCOSE, CAPILLARY
Glucose-Capillary: 110 mg/dL — ABNORMAL HIGH (ref 70–99)
Glucose-Capillary: 154 mg/dL — ABNORMAL HIGH (ref 70–99)
Glucose-Capillary: 145 mg/dL — ABNORMAL HIGH (ref 70–99)
Glucose-Capillary: 182 mg/dL — ABNORMAL HIGH (ref 70–99)

## 2022-07-03 LAB — CBC
HCT: 27.1 % — ABNORMAL LOW (ref 36.0–46.0)
Hemoglobin: 8.8 g/dL — ABNORMAL LOW (ref 12.0–15.0)
MCH: 28 pg (ref 26.0–34.0)
MCHC: 32.5 g/dL (ref 30.0–36.0)
MCV: 86.3 fL (ref 80.0–100.0)
Platelets: 143 10*3/uL — ABNORMAL LOW (ref 150–400)
RBC: 3.14 MIL/uL — ABNORMAL LOW (ref 3.87–5.11)
RDW: 14.7 % (ref 11.5–15.5)
WBC: 4.4 10*3/uL (ref 4.0–10.5)
nRBC: 0 % (ref 0.0–0.2)

## 2022-07-03 MED ORDER — INSULIN ASPART 100 UNIT/ML IJ SOLN
0.0000 [IU] | Freq: Three times a day (TID) | INTRAMUSCULAR | Status: DC
  Administered 2022-07-04: 2 [IU] via SUBCUTANEOUS

## 2022-07-03 MED ORDER — ORAL CARE MOUTH RINSE: 15.0000 mL | OROMUCOSAL | Status: DC | PRN

## 2022-07-03 NOTE — Progress Notes (Signed)
Mobility Specialist Progress Note:   07/03/22 1558  Mobility  Activity Ambulated with assistance in hallway  Level of Assistance Standby assist, set-up cues, supervision of patient - no hands on  Assistive Device Four wheel walker  Distance Ambulated (ft) 80 ft  Activity Response Tolerated well  $Mobility charge 1 Mobility   Pt received in chair willing to participate in mobility. No complaints of pain. Left in chair with call bell in reach and all needs met.   [facility] Xylan Sheils Mobility Specialist

## 2022-07-03 NOTE — Assessment & Plan Note (Signed)
Continue blood pressure monitoring  

## 2022-07-03 NOTE — Plan of Care (Signed)
  Problem: Clinical Measurements: Goal: Respiratory complications will improve Outcome: Progressing   Problem: Activity: Goal: Risk for activity intolerance will decrease Outcome: Progressing   

## 2022-07-03 NOTE — Consult Note (Signed)
Consultation Note Date: 07/03/2022   Patient Name: Rita [name]  DOB: [birth year]  MRN: [id number]  Age / Sex: 66 y.o., female  PCP: [name], MD Referring Physician: Tawni Millers  Reason for Consultation: Establishing goals of care  HPI/Patient Profile: 66 y.o. female  with past medical history of heart failure, T2DM, atrial fibrillation and chronic kidney disease admitted on 06/26/2022 with worsening lower extremity edema.  Patient diagnosed with acute on chronic CHF.  Patient has been weaned off of Lasix and milrinone infusions this admission.  Patient also with acute kidney injury with serum creatinine around 3.3.  Patient also with cirrhosis of liver related to heart failure.  PMT consulted to discuss goals of care.  Clinical Assessment and Goals of Care: I have reviewed medical records including EPIC notes, labs and imaging, assessed the patient and then met with patient and her spouse Thermon to discuss diagnosis prognosis, GOC, EOL wishes, disposition and options.  Throughout conversation patient somewhat lethargic and occasionally a bit confused but easily reoriented to conversation.  Spouse present throughout the conversation to assist patient with decision making.  I introduced Palliative Medicine as specialized medical care for people living with serious illness. It focuses on providing relief from the symptoms and stress of a serious illness. The goal is to improve quality of life for both the patient and the family.  We discussed a brief life review of the patient.  They share they have 3 sons and 1 daughter.  Patient tells me she really enjoys going out shopping but she has not been able to do this lately due to her health.  Patient's spouse speak of a decline at home.  Patient is often quite fatigued and is limited by not feeling well.  She occasionally needs assistance from her family for basic ADLs.    We discussed patient's current illness and what it means in the larger context of patient's on-going co-morbidities.  Natural disease trajectory and expectations at EOL were discussed.  We discussed her heart failure and her acute kidney injury.  I attempted to elicit values and goals of care important to the patient.  Patient tells me quality of life is very important to her.  The difference between aggressive medical intervention and comfort care was considered in light of the patient's goals of care.   We reviewed concern about her kidneys and discussed that hemodialysis would likely not be a good option for her.  Patient and spouse agree that she would not want hemodialysis in order they feel she would tolerate it.  Encouraged patient/family to consider DNR/DNI status understanding evidenced based poor outcomes in similar hospitalized patients, as the cause of the arrest is likely associated with chronic/terminal disease rather than a reversible acute cardio-pulmonary event.  Spouse agrees that DNR/DNI is appropriate.  Patient also seems to agree but ultimately asks for time to consider before making decision.  Discussed with patient and spouse the importance of continued conversation with family and the medical providers regarding overall plan of care and treatment options, ensuring decisions are within the context of the patients values and GOCs.    Patient does share she would not like any sort of support in her home.  Questions and concerns were addressed. The family was encouraged to call with questions or concerns.  Primary Decision Maker NEXT OF KIN -spouse    SUMMARY OF RECOMMENDATIONS   -Patient and spouse both agree patient would not want hemodialysis -Spouse agrees to DNR/DNI but  requests waiting until tomorrow to give patient more time to consider before changing CODE STATUS -Patient not interested in any extra support in the home -PMT will continue to follow  Code  Status/Advance Care Planning: Full code   Symptom Management:  Patient denies pain/shortness of breath  Discharge Planning:  home       Primary Diagnoses: Present on Admission:  Acute on chronic diastolic CHF (congestive heart failure) ([place])  Acute kidney injury superimposed on chronic kidney disease ([place])  Hypertension associated with diabetes ([place])  OSA (obstructive sleep apnea)  Persistent atrial fibrillation (HCC)  Cirrhosis of liver (HCC)  Neck mass  Thrombocytopenia (HCC)  Malnutrition of moderate degree   I have reviewed the medical record, interviewed the patient and family, and examined the patient. The following aspects are pertinent.  Past Medical History:  Diagnosis Date   Acute hypoxemic respiratory failure due to COVID-19 ([place]) 12/30/2019   Anemia    Arthritis    Atrial fibrillation (HCC)    Chronic kidney disease    Chronic respiratory failure with hypoxia, on home O2 therapy (HCC)    CKD (chronic kidney disease), stage III ([place])    COVID-19    Diabetes mellitus without complication (HCC)    Diastolic CHF ([place])    GI bleed    GI bleed    Gout    Hypertension    Thrombocytopenia (HCC)    Social History   Socioeconomic History   Marital status: Married    Spouse name: Not on file   Number of children: Not on file   Years of education: Not on file   Highest education level: Not on file  Occupational History   Not on file  Tobacco Use   Smoking status: Never   Smokeless tobacco: Never  Vaping Use   Vaping Use: Never used  Substance and Sexual Activity   Alcohol use: Never   Drug use: Never   Sexual activity: Not on file  Other Topics Concern   Not on file  Social History Narrative   Not on file   Social Determinants of Health   Financial Resource Strain: Not on file  Food Insecurity: Not on file  Transportation Needs: No Transportation Needs (07/25/2020)   PRAPARE - Hydrologist (Medical): No    Lack of  Transportation (Non-Medical): No  Physical Activity: Not on file  Stress: Not on file  Social Connections: Socially Integrated (07/25/2020)   Social Connection and Isolation Panel [NHANES]    Frequency of Communication with Friends and Family: More than three times a week    Frequency of Social Gatherings with Friends and Family: Once a week    Attends Religious Services: 1 to 4 times per year    Active Member of [employer] or Organizations: No    Attends Music therapist: 1 to 4 times per year    Marital Status: Married   Family History  Problem Relation Age of Onset   Cancer Mother    Scheduled Meds:  aspirin EC  81 mg Oral Daily   carvedilol  3.125 mg Oral BID WC   darbepoetin (ARANESP) injection - NON-DIALYSIS  100 mcg Subcutaneous Q Mon-1800   enoxaparin (LOVENOX) injection  30 mg Subcutaneous Q24H   hydrocerin   Topical BID   insulin aspart  0-5 Units Subcutaneous QHS   insulin aspart  0-9 Units Subcutaneous TID WC   lactose free nutrition  237 mL Oral TID WC   pantoprazole  40 mg Oral Q1200   potassium chloride  20 mEq Oral BID   sodium chloride flush  3 mL Intravenous Q12H   sodium chloride flush  3 mL Intravenous Q12H   sodium chloride flush  3 mL Intravenous Q12H   torsemide  100 mg Oral BID   Continuous Infusions:  sodium chloride     sodium chloride     ferric gluconate (FERRLECIT) IVPB 250 mg (07/03/22 1001)   PRN Meds:.sodium chloride, sodium chloride, acetaminophen **OR** acetaminophen, albuterol, bisacodyl, bisacodyl, HYDROcodone-acetaminophen, ondansetron (ZOFRAN) IV, polyethylene glycol, sodium chloride flush, sodium chloride flush Allergies  Allergen Reactions   Codeine Other (See Comments)    Mouth sores    Prednisone Other (See Comments)    Delusions    Moxifloxacin Rash   Review of Systems  Constitutional:  Positive for activity change, appetite change and fatigue.  Neurological:  Positive for weakness.    Physical Exam Constitutional:       General: She is not in acute distress.    Appearance: She is ill-appearing.     Comments: Slightly lethargic  Pulmonary:     Effort: Pulmonary effort is normal.  Skin:    General: Skin is warm and dry.     Vital Signs: BP (!) 102/58 (BP Location: Left Arm)   Pulse 69   Temp 97.8 F (36.6 C) (Oral)   Resp 18   Ht $R'5\' 1"'bM$  (1.549 m)   Wt 82.6 kg   SpO2 97%   BMI 34.41 kg/m  Pain Scale: 0-10   Pain Score: 0-No pain   SpO2: SpO2: 97 % O2 Device:SpO2: 97 % O2 Flow Rate: .O2 Flow Rate (L/min): 2 L/min  IO: Intake/output summary:  Intake/Output Summary (Last 24 hours) at 07/03/2022 1647 Last data filed at 07/03/2022 1601 Gross per 24 hour  Intake 780 ml  Output 2250 ml  Net -1470 ml    LBM: Last BM Date : 07/02/22 Baseline Weight: Weight: 85.3 kg Most recent weight: Weight: 82.6 kg     Palliative Assessment/Data: PPS 40%     *Please note that this is a verbal dictation therefore any spelling or grammatical errors are due to the "[place] [employer]" system interpretation.  Juel Burrow, DNP, AGNP-C Palliative Medicine Team [phone] Pager: [phone]

## 2022-07-03 NOTE — Progress Notes (Signed)
Progress Note   Patient: Nyhla [name] RJJ:[id number] DOB: May 15, 1956 DOA: 06/26/2022     7 DOS: the patient was seen and examined on 07/03/2022   Brief hospital course: Rita [name] was admitted to the hospital with decompensated heart failure.   66 yo female with the past medical history of heart failure, T2DM, atrial fibrillation and chronic kidney disease who presented with lower extremity edema. At home she had worsening lower extremity edema, that was refractive to increased dose of diuretic therapy. On her initial physical examination her blood pressure was 136/69, HR 74, RR 16 and 02 saturation 87%, lungs with no wheezing or rhonchi, heart with S1 and S2 present irregularly irregular, abdomen not distended and positive lower extremity edema 3+.   Na 137, K 3,7 Cl 92 bicarbonate 34, glucose 151 bun 109 cr 3,68  BNP 999.4 High sensitive troponin 137, 144, 155  Wbc 4,3 hgb 8,6 plt 119  Urine analysis SG 1,006, 0-5 wbc   Chest radiograph with mild cardiomegaly, right lower lobe atelectasis with effusion.   EKG 77 bpm, right axis deviation, normal qtc, atrial fibrillation rhythm, with no significant ST segment or T wave changes.   Ct abdomen and pelvis with cardiomegaly, marked distention of hepatic veins, and suprarenal IVC, large right sided pleural effusion and abdominal wall edema. Positive liver cirrhosis with ascites and hypodensity in the pancreatic body.   Patient was placed on IV furosemide for diuresis  07/20 right sided thoracentesis 1,2 L drained 07/21 placed on milrinone for low output heart failure decompensation.  07/25 right heart catheterization with elevated right and left filling pressures with moderate pulmonary hypertension.     Assessment and Plan: * Acute on chronic diastolic CHF (congestive heart failure) (HCC) Echocardiogram with LV systolic function preserved 60 to 65%, with mild LVH, interventricular septum flattened in systole and diastole, RV  systolic function preserved, mild enlargement of RV cavity. Severe elevation in pulmonary artery systolic pressure 01.6 mmHg. Severe left atrium dilatation. Severe tricuspid valve regurgitation.   Primary pulmonary hypertension plus acute on chronic core pulmonale.   Urine output 2,800 ml over last 24 hrs Systolic blood pressure 010 to 123 mmHg.   Plan to continue diuresis with oral torsemide 100 mg daily.  Limited medical therapy due to low GFR.   Acute on chronic hypoxemic respiratory failure due to cardiogenic pulmonary edema.  Large right pleural effusion sp thoracentesis.   Oxymetry today 98% on 2 L/min per Rita [name] Continue supplemental 02 per Rita [name] to keep 02 saturation 92% or greater. Continue diuresis.    Acute kidney injury superimposed on chronic kidney disease (HCC) CKD stage 4.   Renal function with serum cr at 3.35 with K at 3,5 and serum bicarbonate at 41.Marland Kitchen Plan to continue diuresis with torsemide and follow up renal function in am. Patient likely very poor candidate for renal replacement therapy through HD due to pulmonary hypertension.  Anemia of chronic kidney disease combined with iron deficiency. Continue IV iron infusion x 4 doses.  Follow up hgb.  Continue k supplementation to prevent hypokalemia.    Cirrhosis of liver (HCC) Related to heart failure No clinical signs of decompensated liver disease.   Diabetes mellitus without complication ([place]) Continue glucose cover and monitoring with insulin sliding scale.   Hypertension associated with diabetes ([place]) Continue blood pressure monitoring.   Malnutrition of moderate degree Continue with nutritional supplements.   Persistent atrial fibrillation (HCC) Rate controlled with carvedilol.  Not on anticoagulation due to history of GI  bleed. Continue with aspirin.   Thrombocytopenia ([place]) History of DVT and pulmonary embolism. Patient has IVC filter. Not on anticoagulation due to GI bleeding.          Subjective: Patient with improvement in dyspnea and edema but not back to baseline   Physical Exam: Vitals:   07/03/22 0400 07/03/22 0528 07/03/22 1001 07/03/22 1131  BP: (!) 106/54  (!) 113/51 124/62  Pulse: 78  68   Resp: 18  18   Temp: 98.4 F (36.9 C)   97.7 F (36.5 C)  TempSrc: Oral   Oral  SpO2: 98%  98% 97%  Weight:  82.6 kg    Height:       Neurology awake and alert ENT with mild pallor Cardiovascular with S1 and S2 present with positive 3/6 systolic murmur at the left sternal border with no gallops or rubs Moderate JVD Positive lower extremity edema + to ++ wraps in place Abdomen not distended  Data Reviewed:    Family Communication: no family at the bedside   Disposition: Status is: Inpatient Remains inpatient appropriate because: heart failure and renal failure   Planned Discharge Destination: Home     Author: Tawni Millers, MD 07/03/2022 12:16 PM  For on call review [email or web address].

## 2022-07-03 NOTE — Assessment & Plan Note (Signed)
Continue with nutritional supplements.  

## 2022-07-03 NOTE — Assessment & Plan Note (Addendum)
Rate controlled with carvedilol.  Not on anticoagulation due to history of GI bleed. Continue with aspirin.

## 2022-07-03 NOTE — Assessment & Plan Note (Signed)
History of DVT and pulmonary embolism. Patient has IVC filter. Not on anticoagulation due to GI bleeding.

## 2022-07-03 NOTE — Hospital Course (Signed)
Mrs. Bogie was admitted to the hospital with decompensated heart failure.   66 yo female with the past medical history of heart failure, T2DM, atrial fibrillation and chronic kidney disease who presented with lower extremity edema. At home she had worsening lower extremity edema, that was refractive to increased dose of diuretic therapy. On her initial physical examination her blood pressure was 136/69, HR 74, RR 16 and 02 saturation 87%, lungs with no wheezing or rhonchi, heart with S1 and S2 present irregularly irregular, abdomen not distended and positive lower extremity edema 3+.   Na 137, K 3,7 Cl 92 bicarbonate 34, glucose 151 bun 109 cr 3,68  BNP 999.4 High sensitive troponin 137, 144, 155  Wbc 4,3 hgb 8,6 plt 119  Urine analysis SG 1,006, 0-5 wbc   Chest radiograph with mild cardiomegaly, right lower lobe atelectasis with effusion.   EKG 77 bpm, right axis deviation, normal qtc, atrial fibrillation rhythm, with no significant ST segment or T wave changes.   Ct abdomen and pelvis with cardiomegaly, marked distention of hepatic veins, and suprarenal IVC, large right sided pleural effusion and abdominal wall edema. Positive liver cirrhosis with ascites and hypodensity in the pancreatic body.   Patient was placed on IV furosemide for diuresis  07/20 right sided thoracentesis 1,2 L drained 07/21 placed on milrinone for low output heart failure decompensation.  07/25 right heart catheterization with elevated right and left filling pressures with moderate pulmonary hypertension.

## 2022-07-03 NOTE — Progress Notes (Signed)
Physical Therapy Treatment Patient Details Name: Rita [name] MRN: [id number] DOB: 07/07/1956 Today's Date: 07/03/2022   History of Present Illness 66 y.o. F admitted on 06/26/22 due to increased O2 needs and swelling. PMH significant for diabetes mellitus diastolic CHF chronic respiratory failure on 1 to 2 L of oxygen anemia, atrial fibrillation, CKD stage III hypertension thrombocytopenia GI bleed.    PT Comments    Pt tolerates treatment well, PT providing further education on the need for seated rest breaks to allow for energy conservation at the time of discharge. Pt discusses plan for progressive walking program with supervision of spouse to allow for improvement in activity tolerance. Pt will benefit from continued aggressive mobilization in an effort to restore her prior level of function.  Recommendations for follow up therapy are one component of a multi-disciplinary discharge planning process, led by the attending physician.  Recommendations may be updated based on patient status, additional functional criteria and insurance authorization.  Follow Up Recommendations  No PT follow up     Assistance Recommended at Discharge PRN  Patient can return home with the following A little help with bathing/dressing/bathroom;Assistance with cooking/housework;Assist for transportation;Help with stairs or ramp for entrance   Equipment Recommendations  None recommended by PT    Recommendations for Other Services       Precautions / Restrictions Precautions Precautions: Fall Restrictions Weight Bearing Restrictions: No     Mobility  Bed Mobility                    Transfers Overall transfer level: Modified independent Equipment used: Rollator (4 wheels) Transfers: Sit to/from Stand Sit to Stand: Modified independent (Device/Increase time)                Ambulation/Gait Ambulation/Gait assistance: Supervision Gait Distance (Feet): 200 Feet (brief  standing rest break mid-walk) Assistive device: Rollator (4 wheels) Gait Pattern/deviations: Step-through pattern Gait velocity: reduced Gait velocity interpretation: 1.31 - 2.62 ft/sec, indicative of limited community ambulator   General Gait Details: steady step-through gait. PT provides cues for seated rest break on rollator to allow for energy conservation yet pt electing to stand and lean onto rollator   Stairs             Wheelchair Mobility    Modified Rankin (Stroke Patients Only)       Balance Overall balance assessment: Needs assistance Sitting-balance support: No upper extremity supported, Feet supported Sitting balance-Leahy Scale: Good     Standing balance support: Single extremity supported, Reliant on assistive device for balance Standing balance-Leahy Scale: Poor                              Cognition Arousal/Alertness: Awake/alert Behavior During Therapy: WFL for tasks assessed/performed Overall Cognitive Status: Within Functional Limits for tasks assessed                                          Exercises      General Comments General comments (skin integrity, edema, etc.): Pt desaturates on room air, requiring 2L [place] to maintain sats at or above 90% when mobilizing. Pt and PT discuss walking program upon return home including time of day to walk outside vs inside and progression of ambulation distances. Pt performing hygiene tasks in standing as purewick not providing proper suction of urine  Pertinent Vitals/Pain Pain Assessment Pain Assessment: No/denies pain    Home Living Family/patient expects to be discharged to:: Private residence                        Prior Function            PT Goals (current goals can now be found in the care plan section) Acute Rehab PT Goals Patient Stated Goal: Return home Progress towards PT goals: Progressing toward goals    Frequency    Min  3X/week      PT Plan Current plan remains appropriate    Co-evaluation              AM-PAC PT "6 Clicks" Mobility   Outcome Measure  Help needed turning from your back to your side while in a flat bed without using bedrails?: None Help needed moving from lying on your back to sitting on the side of a flat bed without using bedrails?: None Help needed moving to and from a bed to a chair (including a wheelchair)?: None Help needed standing up from a chair using your arms (e.g., wheelchair or bedside chair)?: None Help needed to walk in hospital room?: A Little Help needed climbing 3-5 steps with a railing? : A Little 6 Click Score: 22    End of Session Equipment Utilized During Treatment: Oxygen Activity Tolerance: Patient tolerated treatment well Patient left: in chair;with call bell/phone within reach;with family/visitor present Nurse Communication: Mobility status PT Visit Diagnosis: Other abnormalities of gait and mobility (R26.89);Muscle weakness (generalized) (M62.81);Difficulty in walking, not elsewhere classified (R26.2)     Time: 5051-8335 PT Time Calculation (min) (ACUTE ONLY): 23 min  Charges:  $Gait Training: 8-22 mins $Therapeutic Activity: 8-22 mins                     Zenaida Niece, PT, DPT Acute Rehabilitation Office [place] 07/03/2022, 1:53 PM

## 2022-07-03 NOTE — Assessment & Plan Note (Addendum)
Echocardiogram with LV systolic function preserved 60 to 65%, with mild LVH, interventricular septum flattened in systole and diastole, RV systolic function preserved, mild enlargement of RV cavity. Severe elevation in pulmonary artery systolic pressure 59.1 mmHg. Severe left atrium dilatation. Severe tricuspid valve regurgitation.   Primary pulmonary hypertension plus acute on chronic core pulmonale.   Urine output 2,800 ml over last 24 hrs Systolic blood pressure 368 to 123 mmHg.   Plan to continue diuresis with oral torsemide 100 mg daily.  Limited medical therapy due to low GFR.   Acute on chronic hypoxemic respiratory failure due to cardiogenic pulmonary edema.  Large right pleural effusion sp thoracentesis.   Oxymetry today 98% on 2 L/min per [place] Continue supplemental 02 per [place] to keep 02 saturation 92% or greater. Continue diuresis.

## 2022-07-03 NOTE — Assessment & Plan Note (Addendum)
CKD stage 4.   Renal function with serum cr at 3.35 with K at 3,5 and serum bicarbonate at 41.Marland Kitchen Plan to continue diuresis with torsemide and follow up renal function in am. Patient likely very poor candidate for renal replacement therapy through HD due to pulmonary hypertension.  Anemia of chronic kidney disease combined with iron deficiency. Continue IV iron infusion x 4 doses.  Follow up hgb.  Continue k supplementation to prevent hypokalemia.

## 2022-07-03 NOTE — Progress Notes (Signed)
Patient ID: Rita [name], female   DOB: September 04, 1956, 66 y.o.   MRN: [id number]     Advanced Heart Failure Rounding Note  PCP-Cardiologist: None   Subjective:    Off Lasix and milrinone gtts.  She is now on torsemide 100 mg bid.  I/Os net negative 2020 with weight down.   Scr 3.66 -> 3.50  -> 3.18-> 3.36-> 3.37 -> 3.53.   Breathing is significantly better.  [facility] Procedural Findings (7/25): Hemodynamics (mmHg) RA mean 10 RV 56/10 PA 57/21, mean 38 PCWP mean 22 Oxygen saturations: PA 61% AO 97% Cardiac Output (Fick) 6.15  Cardiac Index (Fick) 3.31 PVR 2.6 WU PAPi 3.6  Objective:   Weight Range: 82.6 kg Body mass index is 34.41 kg/m.   Vital Signs:   Temp:  [97.9 F (36.6 C)-98.4 F (36.9 C)] 98.4 F (36.9 C) (07/26 0400) Pulse Rate:  [71-78] 78 (07/26 0400) Resp:  [17-18] 18 (07/26 0400) BP: (102-114)/(47-64) 106/54 (07/26 0400) SpO2:  [86 %-100 %] 98 % (07/26 0400) Weight:  [82.6 kg] 82.6 kg (07/26 0528) Last BM Date : 07/02/22  Weight change: Filed Weights   07/01/22 0129 07/02/22 0320 07/03/22 0528  Weight: 86.6 kg 85.4 kg 82.6 kg    Intake/Output:   Intake/Output Summary (Last 24 hours) at 07/03/2022 0836 Last data filed at 07/03/2022 0528 Gross per 24 hour  Intake 780 ml  Output 2250 ml  Net -1470 ml      Physical Exam    General: NAD Neck: JVP 10 cm, no thyromegaly or thyroid nodule.  Lungs: Clear to auscultation bilaterally with normal respiratory effort. CV: Nondisplaced PMI.  Heart irregular S1/S2, no S3/S4, 2/6 SEM RUSB.  1+ ankle edema.  Abdomen: Soft, nontender, no hepatosplenomegaly, no distention.  Skin: Intact without lesions or rashes.  Neurologic: Alert and oriented x 3.  Psych: Normal affect. Extremities: No clubbing or cyanosis.  HEENT: Normal.   Telemetry   A fib in 70's (Personally reviewed)   Labs    CBC Recent Labs    07/01/22 0601 07/02/22 1137 07/03/22 0127  WBC 5.1  --  4.4  HGB 8.2* 10.5*  10.5*  8.8*  HCT 24.5* 31.0*  31.0* 27.1*  MCV 85.1  --  86.3  PLT 148*  --  322*   Basic Metabolic Panel Recent Labs    07/02/22 0208 07/02/22 1137 07/03/22 0127  NA 139 139  139 139  K 3.6 3.6  3.6 3.5  CL 82*  --  86*  CO2 43*  --  41*  GLUCOSE 116*  --  123*  BUN 75*  --  72*  CREATININE 3.37*  --  3.53*  CALCIUM 9.5  --  9.2   Liver Function Tests No results for input(s): "AST", "ALT", "ALKPHOS", "BILITOT", "PROT", "ALBUMIN" in the last 72 hours.  No results for input(s): "LIPASE", "AMYLASE" in the last 72 hours. Cardiac Enzymes No results for input(s): "CKTOTAL", "CKMB", "CKMBINDEX", "TROPONINI" in the last 72 hours.   BNP: BNP (last 3 results) Recent Labs    05/28/22 0935 06/19/22 1059 06/26/22 1212  BNP 722.5* 688.3* 999.4*    ProBNP (last 3 results) No results for input(s): "PROBNP" in the last 8760 hours.   D-Dimer No results for input(s): "DDIMER" in the last 72 hours. Hemoglobin A1C No results for input(s): "HGBA1C" in the last 72 hours.  Fasting Lipid Panel No results for input(s): "CHOL", "HDL", "LDLCALC", "TRIG", "CHOLHDL", "LDLDIRECT" in the last 72 hours. Thyroid Function Tests No  results for input(s): "TSH", "T4TOTAL", "T3FREE", "THYROIDAB" in the last 72 hours.  Invalid input(s): "FREET3"   Other results:   Imaging    CARDIAC CATHETERIZATION  Result Date: 07/02/2022 1. Mildly elevated right and left heart filling pressures. 2. Moderate mixed pulmonary venous/pulmonary arterial (likely due to OHS/OSA) HTN.  3. Preserved cardiac output.     Medications:     Scheduled Medications:  aspirin EC  81 mg Oral Daily   carvedilol  3.125 mg Oral BID WC   darbepoetin (ARANESP) injection - NON-DIALYSIS  100 mcg Subcutaneous Q Mon-1800   enoxaparin (LOVENOX) injection  30 mg Subcutaneous Q24H   hydrocerin   Topical BID   insulin aspart  0-5 Units Subcutaneous QHS   insulin aspart  0-9 Units Subcutaneous TID WC   lactose free nutrition  237  mL Oral TID WC   pantoprazole  40 mg Oral Q1200   potassium chloride  20 mEq Oral BID   sodium chloride flush  3 mL Intravenous Q12H   sodium chloride flush  3 mL Intravenous Q12H   sodium chloride flush  3 mL Intravenous Q12H   torsemide  100 mg Oral BID    Infusions:  sodium chloride     sodium chloride     ferric gluconate (FERRLECIT) IVPB 250 mg (07/02/22 0828)    PRN Medications: sodium chloride, sodium chloride, acetaminophen **OR** acetaminophen, albuterol, bisacodyl, bisacodyl, HYDROcodone-acetaminophen, ondansetron (ZOFRAN) IV, polyethylene glycol, sodium chloride flush, sodium chloride flush    Patient Profile   [name] is a 66 y.o. 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 Here for Acute / Chronic diastolic HF with marked volume overload.  Assessment/Plan   1.  Acute / Chronic diastolic HF: Prominent signs of RV failure.  9/21 echo showed EF 40-45%, global hypokinesis, D-shaped IV septum suggestive of RV pressure/volume overload, RV poorly visualized but appeared normal in size with at least mild systolic dysfunction. Cause of cardiomyopathy is uncertain.  She has history of COVID-19 1/21, cannot rule out viral myocarditis. Chemotherapy-mediated CMP is possible (not sure what chemo she had remotely for breast cancer).  Cardiac MRI showed LV EF 41% with diffuse hypokinesis, RV mildly dilated but with EF 27% and D-shaped septum, nonspecific RV insertion site LGE.  Suspect RV > LV failure. Cardiac MRI is not suggestive of amyloidosis, no  M-spike on myeloma panel. We have not ruled out CAD (cath deferred given lack of ischemic CP and CKD). Milrinone started primarily for RV support during admission in 9/21 and titrated off. She had cardiomems placed 1/22, goal has been 21 mmHg. Echo in 1/23 showed improved LV function, EF 55-60%, mild LVH, mild RV enlargement with normal systolic function, IVC dilated, PASP 68 mmHg. Admitted this time with CHF exacerbation and AKI. Hs-TnI mildly elevated with no trend, consistent with demand ischemia due to volume overload. Echo this admit with LVEF 60-65%, interventricular septum flattened in systole and diastole, consistent w/ RV pressure and volume overload, RV mildly enlarged, RV function normal, severaly elevated PASP, LA severely dilated, trivial MR, severe TR. She has been diuresed this admission with weight down significantly. Initially on  milrinone for RV support, now off.  [place] on 7/25 showed that filling pressures were still mildly elevated and she still looks mildly volume overloaded on exam.  She is now on po torsemide, creatinine is mildly higher today at 3.5 with BUN lower.  - Continue carvedilol at 3.125 (home dose 6.25) - Continue holding hydralazine + Imdur, BP not elevated.  - GFR < 20, not a candidate for SGLT2i. - At home, she takes either torsemide 80 mg bid or 100 mg bid depending on her weight.  For now, continue torsemide 100 mg bid.  Will need to make sure creatinine stabilizes.  - No ARNI/spironolactone with elevated creatinine.  - Difficult situation, LV diastolic failure with prominent RV failure in setting of cardiorenal syndrome with creatinine mildly higher today.  She is still volume overloaded though improved from admission.  Hopefully, she can stabilize on torsemide 100 mg bid.  2. Chronic hypoxemic respiratory failure: OHS/OSA.  She was using home oxygen prior to COVID-19 PNA.  - Not currently wearing CPAP at home, has hospital one at bedside but refuses to use overnight d/t  sudden headache per patient, wants to get back on her home CPAP which has been taken from her due to noncompliance => will have to address this as outpatient. - Stable on O2 via [place] 3. Atrial fibrillation: Chronic, all ECGs since 7/21 show atrial fibrillation.  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 Rate is controlled. No overt bleeding.  - She cannot be cardioverted at this point as she cannot be chronically anticoagulated. - Evaluated by Dr. Quentin Ore for [place] but not a suitable candidate given multiple severe medical comorbidities and risks associated with general anesthesia. 4. HTN: BP stable on current regimen. Can add back hydralazine if BP rises.  5. Hx of GI bleeding: Recurrent, from small bowel AVMs. Unable to be anticoagulated. No recent overt bleeding. Hgb has been stable.  6. H/o DVT/PE: Provoked (post-surgery). No longer on anticoagulation due to h/o significant GIB.  Has IVC filter.  7. AKI on CKD Stage IV: Peak creatinine 3.66, 3.5 today though BUN lower.  [place] had been 2.6.  Denies NSAID use.  Unremarkable renal US.  - Would be difficult HD candidate with RV failure.  8. Right pleural effusion: CT scan this admit with large right pleural effusion.  Thoracentesis 06/27/22- Yielded 1.2 L of hazy amber fluid, cultures NGTD.  9. DM II - continue SSI 10. Anemia: Suspect due to chronic low grade GI bleeding (small bowel AVMs).  Had had feraheme.  - Daily CBC 11. Cardiac cirrhosis: CT showed nodular contour of the liver capsule consistent with cirrhosis. No focal abnormality on this limited unenhanced exam. Denies ETOH use.  Hep A,B,C (-).  GI saw and signed off, followup as outpatient.  12. Thrombocytopenia: Chronic, mild.  May be due to cirrhosis.  13. NSVT - Keep K > 4.0 Mg > 2,0  - Daily BMET 14. Enlarged thyroid: Thyroid US 06/27/20: 1.5 cm left mid thyroid TR 3 nodule. TSH was WNL .592  She is now on po torsemide but creatinine  higher.  Difficult because she still has some volume overload.  Would watch today to see if renal function settles out (BUN coming down though creatinine higher).  Will also consult palliative care, think she would have a difficult time with HD in setting of RV failure.   Loralie Champagne. 8:36 AM

## 2022-07-03 NOTE — Assessment & Plan Note (Signed)
Patient was placed on insulin sliding scale for glucose cover and monitoring during her hospitalization.

## 2022-07-03 NOTE — Assessment & Plan Note (Signed)
Related to heart failure No clinical signs of decompensated liver disease.

## 2022-07-04 ENCOUNTER — Other Ambulatory Visit (HOSPITAL_COMMUNITY): Payer: Self-pay

## 2022-07-04 ENCOUNTER — Inpatient Hospital Stay (HOSPITAL_COMMUNITY): Payer: PPO

## 2022-07-04 DIAGNOSIS — E669 Obesity, unspecified: Secondary | ICD-10-CM | POA: Diagnosis present

## 2022-07-04 DIAGNOSIS — E440 Moderate protein-calorie malnutrition: Secondary | ICD-10-CM | POA: Diagnosis not present

## 2022-07-04 DIAGNOSIS — Z7189 Other specified counseling: Secondary | ICD-10-CM | POA: Diagnosis not present

## 2022-07-04 DIAGNOSIS — N179 Acute kidney failure, unspecified: Secondary | ICD-10-CM | POA: Diagnosis not present

## 2022-07-04 DIAGNOSIS — K7460 Unspecified cirrhosis of liver: Secondary | ICD-10-CM | POA: Diagnosis not present

## 2022-07-04 DIAGNOSIS — Z515 Encounter for palliative care: Secondary | ICD-10-CM | POA: Diagnosis not present

## 2022-07-04 DIAGNOSIS — I5033 Acute on chronic diastolic (congestive) heart failure: Secondary | ICD-10-CM | POA: Diagnosis not present

## 2022-07-04 LAB — BASIC METABOLIC PANEL
Anion gap: 12 (ref 5–15)
BUN: 67 mg/dL — ABNORMAL HIGH (ref 8–23)
CO2: 40 mmol/L — ABNORMAL HIGH (ref 22–32)
Calcium: 9.2 mg/dL (ref 8.9–10.3)
Chloride: 86 mmol/L — ABNORMAL LOW (ref 98–111)
Creatinine, Ser: 3.42 mg/dL — ABNORMAL HIGH (ref 0.44–1.00)
GFR, Estimated: 14 mL/min — ABNORMAL LOW (ref >60)
Glucose, Bld: 113 mg/dL — ABNORMAL HIGH (ref 70–99)
Potassium: 3.4 mmol/L — ABNORMAL LOW (ref 3.5–5.1)
Sodium: 138 mmol/L (ref 135–145)

## 2022-07-04 LAB — PULMONARY FUNCTION TEST
FEF 25-75 Pre: 1.03 L/sec
FEF2575-%Pred-Pre: 56 %
FEV1-%Pred-Pre: 40 %
FEV1-Pre: 0.83 L
FEV1FVC-%Pred-Pre: 114 %
FEV6-%Pred-Pre: 36 %
FEV6-Pre: 0.95 L
FEV6FVC-%Pred-Pre: 104 %
FVC-%Pred-Pre: 35 %
FVC-Pre: 0.95 L
Pre FEV1/FVC ratio: 87 %
Pre FEV6/FVC Ratio: 100 %

## 2022-07-04 LAB — CBC
HCT: 26.8 % — ABNORMAL LOW (ref 36.0–46.0)
Hemoglobin: 8.7 g/dL — ABNORMAL LOW (ref 12.0–15.0)
MCH: 28 pg (ref 26.0–34.0)
MCHC: 32.5 g/dL (ref 30.0–36.0)
MCV: 86.2 fL (ref 80.0–100.0)
Platelets: 148 10*3/uL — ABNORMAL LOW (ref 150–400)
RBC: 3.11 MIL/uL — ABNORMAL LOW (ref 3.87–5.11)
RDW: 14.7 % (ref 11.5–15.5)
WBC: 5 10*3/uL (ref 4.0–10.5)
nRBC: 0 % (ref 0.0–0.2)

## 2022-07-04 LAB — GLUCOSE, CAPILLARY
Glucose-Capillary: 115 mg/dL — ABNORMAL HIGH (ref 70–99)
Glucose-Capillary: 181 mg/dL — ABNORMAL HIGH (ref 70–99)

## 2022-07-04 LAB — LIPOPROTEIN A (LPA): Lipoprotein (a): 64 nmol/L — ABNORMAL HIGH (ref <75.0)

## 2022-07-04 MED ORDER — TORSEMIDE 100 MG PO TABS
100.0000 mg | ORAL_TABLET | Freq: Two times a day (BID) | ORAL | 0 refills | Status: DC
  Filled 2022-07-04: qty 60, 30d supply, fill #0

## 2022-07-04 MED ORDER — CARVEDILOL 3.125 MG PO TABS
3.1250 mg | ORAL_TABLET | Freq: Two times a day (BID) | ORAL | 0 refills | Status: DC
  Filled 2022-07-04: qty 60, 30d supply, fill #0

## 2022-07-04 MED ORDER — POTASSIUM CHLORIDE CRYS ER 20 MEQ PO TBCR
20.0000 meq | EXTENDED_RELEASE_TABLET | Freq: Two times a day (BID) | ORAL | 0 refills | Status: DC
  Filled 2022-07-04: qty 60, 30d supply, fill #0

## 2022-07-04 NOTE — Progress Notes (Addendum)
Patient ID: Rita [name], female   DOB: August 31, 1956, 66 y.o.   MRN: [id number]     Advanced Heart Failure Rounding Note  PCP-Cardiologist: None   Subjective:    Off Lasix and milrinone gtts.  She is now on torsemide 100 mg bid.  I/Os net negative 2200, -4lbs overnight. -27lbs since admission.   Scr 3.66 -> 3.50  -> 3.18-> 3.36-> 3.37 -> 3.53-> 3.4.   Ready to go home. Eating breakfast, sitting on EOB. Denies CP and SOB.   RHC Procedural Findings (7/25): Hemodynamics (mmHg) RA mean 10 RV 56/10 PA 57/21, mean 38 PCWP mean 22 Oxygen saturations: PA 61% AO 97% Cardiac Output (Fick) 6.15  Cardiac Index (Fick) 3.31 PVR 2.6 WU PAPi 3.6  Objective:   Weight Range: 81.1 kg Body mass index is 33.77 kg/m.   Vital Signs:   Temp:  [97.7 F (36.5 C)-97.9 F (36.6 C)] 97.8 F (36.6 C) (07/27 0328) Pulse Rate:  [68-69] 69 (07/26 1557) Resp:  [16-18] 18 (07/27 0328) BP: (102-124)/(51-63) 111/59 (07/27 0328) SpO2:  [92 %-98 %] 93 % (07/27 0328) Weight:  [81.1 kg] 81.1 kg (07/27 0600) Last BM Date : 07/03/22  Weight change: Filed Weights   07/02/22 0320 07/03/22 0528 07/04/22 0600  Weight: 85.4 kg 82.6 kg 81.1 kg    Intake/Output:   Intake/Output Summary (Last 24 hours) at 07/04/2022 0748 Last data filed at 07/04/2022 0600 Gross per 24 hour  Intake 720 ml  Output 2200 ml  Net -1480 ml      Physical Exam   General:  chronically ill appearing. No respiratory difficulty HEENT: normal Neck: supple. JVD ~8 cm. Carotids 2+ bilat; no bruits. No lymphadenopathy or thyromegaly appreciated. Cor: PMI nondisplaced. Irregular rate & rhythm. No rubs, gallops or murmurs. Lungs: clear, diminished at bases Abdomen: soft, nontender, nondistended. No hepatosplenomegaly. No bruits or masses. Good bowel sounds. Extremities: no cyanosis, clubbing, rash. +1 BLE Neuro: alert & oriented x 3, cranial nerves grossly intact. moves all 4 extremities w/o difficulty. Affect pleasant.    Telemetry   A fib in 60-70's (Personally reviewed)    Labs    CBC Recent Labs    07/03/22 0127 07/04/22 0212  WBC 4.4 5.0  HGB 8.8* 8.7*  HCT 27.1* 26.8*  MCV 86.3 86.2  PLT 143* 941*   Basic Metabolic Panel Recent Labs    07/03/22 0127 07/04/22 0212  NA 139 138  K 3.5 3.4*  CL 86* 86*  CO2 41* 40*  GLUCOSE 123* 113*  BUN 72* 67*  CREATININE 3.53* 3.42*  CALCIUM 9.2 9.2   Liver Function Tests No results for input(s): "AST", "ALT", "ALKPHOS", "BILITOT", "PROT", "ALBUMIN" in the last 72 hours.  No results for input(s): "LIPASE", "AMYLASE" in the last 72 hours. Cardiac Enzymes No results for input(s): "CKTOTAL", "CKMB", "CKMBINDEX", "TROPONINI" in the last 72 hours.   BNP: BNP (last 3 results) Recent Labs    05/28/22 0935 06/19/22 1059 06/26/22 1212  BNP 722.5* 688.3* 999.4*    ProBNP (last 3 results) No results for input(s): "PROBNP" in the last 8760 hours.   D-Dimer No results for input(s): "DDIMER" in the last 72 hours. Hemoglobin A1C No results for input(s): "HGBA1C" in the last 72 hours.  Fasting Lipid Panel No results for input(s): "CHOL", "HDL", "LDLCALC", "TRIG", "CHOLHDL", "LDLDIRECT" in the last 72 hours. Thyroid Function Tests No results for input(s): "TSH", "T4TOTAL", "T3FREE", "THYROIDAB" in the last 72 hours.  Invalid input(s): "FREET3"   Other results:  Imaging    No results found.   Medications:     Scheduled Medications:  aspirin EC  81 mg Oral Daily   carvedilol  3.125 mg Oral BID WC   darbepoetin (ARANESP) injection - NON-DIALYSIS  100 mcg Subcutaneous Q Mon-1800   enoxaparin (LOVENOX) injection  30 mg Subcutaneous Q24H   hydrocerin   Topical BID   insulin aspart  0-5 Units Subcutaneous QHS   insulin aspart  0-9 Units Subcutaneous TID WC   lactose free nutrition  237 mL Oral TID WC   pantoprazole  40 mg Oral Q1200   potassium chloride  20 mEq Oral BID   sodium chloride flush  3 mL Intravenous Q12H    torsemide  100 mg Oral BID    Infusions:  sodium chloride     ferric gluconate (FERRLECIT) IVPB 250 mg (07/03/22 1001)    PRN Medications: sodium chloride, acetaminophen **OR** acetaminophen, albuterol, bisacodyl, bisacodyl, HYDROcodone-acetaminophen, ondansetron (ZOFRAN) IV, mouth rinse, polyethylene glycol, sodium chloride flush    Patient Profile   Rita [name] is a 66 y.o. 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 Here for Acute / Chronic diastolic HF with marked volume overload.  Assessment/Plan   1.  Acute / Chronic diastolic HF: Prominent signs of RV failure.  9/21 echo showed EF 40-45%, global hypokinesis, D-shaped IV septum suggestive of RV pressure/volume overload, RV poorly visualized but appeared normal in size with at least mild systolic dysfunction. Cause of cardiomyopathy is uncertain.  She has history of COVID-19 1/21, cannot rule out viral myocarditis. Chemotherapy-mediated CMP is possible (not sure what chemo she had remotely for breast cancer).  Cardiac MRI showed LV EF 41% with diffuse hypokinesis, RV mildly dilated but with EF 27% and D-shaped septum, nonspecific RV insertion site LGE.  Suspect RV > LV failure. Cardiac MRI is not suggestive of amyloidosis, no M-spike on myeloma panel. We have not ruled out CAD (cath deferred given lack of ischemic CP and CKD). Milrinone started primarily for RV support during admission in 9/21 and titrated off. She had cardiomems placed 1/22, goal has been 21 mmHg. Echo in 1/23 showed improved LV function, EF  55-60%, mild LVH, mild RV enlargement with normal systolic function, IVC dilated, PASP 68 mmHg. Admitted this time with CHF exacerbation and AKI. Hs-TnI mildly elevated with no trend, consistent with demand ischemia due to volume overload. Echo this admit with LVEF 60-65%, interventricular septum flattened in systole and diastole, consistent w/ RV pressure and volume overload, RV mildly enlarged, RV function normal, severaly elevated PASP, LA severely dilated, trivial MR, severe TR. She has been diuresed this admission with weight down significantly. Initially on milrinone for RV support, now off.  Huetter on 7/25 showed that filling pressures were still mildly elevated and she still looks mildly volume overloaded on exam.  She is now on po torsemide, creatinine starting to downtrend today at 3.4 with BUN lower.  - Continue carvedilol at 3.125 (home dose 6.25) - Continue holding hydralazine + Imdur, BP not elevated.  - GFR < 20, not a candidate for SGLT2i. - At home, she takes either torsemide 80 mg  bid or 100 mg bid depending on her weight.  For now, continue torsemide 100 mg bid.  Will need to make sure creatinine stabilizes.  - No ARNI/spironolactone with elevated creatinine.  - Difficult situation, LV diastolic failure with prominent RV failure in setting of cardiorenal syndrome with creatinine downtrending today 3.53-> 3.4.  She is still volume overloaded though improved from admission.  Hopefully, she can stabilize on torsemide 100 mg bid.  2. Chronic hypoxemic respiratory failure: OHS/OSA.  She was using home oxygen prior to COVID-19 PNA.  - Not currently wearing CPAP at home, has hospital one at bedside but refuses to use overnight d/t sudden headache per patient, wants to get back on her home CPAP which has been taken from her due to noncompliance => will have to address this as outpatient. - Stable on O2 via [place] 3. Atrial fibrillation: Chronic, all ECGs since 7/21 show atrial fibrillation.  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 Rate is controlled. No overt bleeding.  - She cannot be cardioverted at this point as she cannot be chronically anticoagulated. - Evaluated by Dr. Quentin Ore for [facility] but not a suitable candidate given multiple severe medical comorbidities and risks associated with general anesthesia. 4. HTN: BP stable on current regimen. Can add back hydralazine if BP rises.  5. Hx of GI bleeding: Recurrent, from small bowel AVMs. Unable to be anticoagulated. No recent overt bleeding. Hgb has been stable.  6. H/o DVT/PE: Provoked (post-surgery). No longer on anticoagulation due to h/o significant GIB.  Has IVC filter.  7. AKI on CKD Stage IV: Peak creatinine 3.66, 3.5 today though BUN lower.  Baesline had been 2.6.  Denies NSAID use.  Unremarkable renal US.  - Would be difficult HD candidate with RV failure.  8. Right pleural effusion: CT scan this admit with large right pleural effusion.  Thoracentesis 06/27/22- Yielded 1.2 L of hazy amber fluid, cultures NGTD.  9. DM II - continue SSI 10. Anemia: Suspect due to chronic low grade GI bleeding (small bowel AVMs).  Had had feraheme.  - Daily CBC 11. Cardiac cirrhosis: CT showed nodular contour of the liver capsule consistent with cirrhosis. No focal abnormality on this limited unenhanced exam. Denies ETOH use.  Hep A,B,C (-).  GI saw and signed off, followup as outpatient.  12. Thrombocytopenia: Chronic, mild.  May be due to cirrhosis.  13. NSVT - Keep K > 4.0 Mg > 2,0  - Daily BMET 14. Enlarged thyroid: Thyroid US 06/27/20: 1.5 cm left mid thyroid TR 3 nodule. TSH was WNL .[facility] to go home today per AHF. Fluid stable. Breathing much better. Follow-up scheduled 07/12/22.   Home meds: Torsemide 100mg  BID, carvedilol 3.125 BID, potassium chloride 20 mEq BID  Earnie Larsson AGACNP-BC 7:48 AM  Patient seen with NP, agree with the above note.   Creatinine down to 3.4, weight continues to  down-trend.  Patient feels better overall.    General: NAD Neck: JVP 8 cm, no thyromegaly or thyroid nodule.  Lungs: Clear to auscultation bilaterally with normal respiratory effort. CV: Nondisplaced PMI.  Heart irregular S1/S2, no S3/S4, no murmur.  No peripheral edema.   Abdomen: Soft, nontender, no hepatosplenomegaly, no distention.  Skin: Intact without lesions or rashes.  Neurologic: Alert and oriented x 3.  Psych: Normal affect. Extremities: No clubbing or cyanosis.  HEENT: Normal.   Creatinine seems to have stabilized at new (higher) set point.  Would be poor  candidate for long-term HD.   Volume status improved, now on po diuretic.   Chronic AF, not candidate for anticoagulation or [place].   OK for home today from my standpoint.  Cardiac meds for home: torsemide 100 mg bid, KCl 20 bid, Coreg 3.125 mg bid.  She will need close followup in CHF clinic, will arrange.   Loralie Champagne. 07/04/2022 8:24 AM

## 2022-07-04 NOTE — Progress Notes (Signed)
Daily Progress Note   Patient Name: Rita [name]       Date: 07/04/2022 DOB: 07-30-1956  Age: 66 y.o. MRN#: [id number] Attending Physician: Tawni Millers Primary Care Physician: [name], MD Admit Date: 06/26/2022  Reason for Consultation/Follow-up: Establishing goals of care  Subjective: Feeling better today Excited to go home  Length of Stay: 8  Current Medications: Scheduled Meds:   aspirin EC  81 mg Oral Daily   carvedilol  3.125 mg Oral BID WC   darbepoetin (ARANESP) injection - NON-DIALYSIS  100 mcg Subcutaneous Q Mon-1800   enoxaparin (LOVENOX) injection  30 mg Subcutaneous Q24H   hydrocerin   Topical BID   insulin aspart  0-5 Units Subcutaneous QHS   insulin aspart  0-9 Units Subcutaneous TID WC   lactose free nutrition  237 mL Oral TID WC   pantoprazole  40 mg Oral Q1200   potassium chloride  20 mEq Oral BID   sodium chloride flush  3 mL Intravenous Q12H   torsemide  100 mg Oral BID    Continuous Infusions:  sodium chloride      PRN Meds: sodium chloride, acetaminophen **OR** acetaminophen, albuterol, bisacodyl, bisacodyl, HYDROcodone-acetaminophen, ondansetron (ZOFRAN) IV, mouth rinse, polyethylene glycol, sodium chloride flush  Physical Exam Constitutional:      General: She is not in acute distress.    Appearance: She is ill-appearing.  Pulmonary:     Effort: Pulmonary effort is normal.  Skin:    General: Skin is warm and dry.  Neurological:     Mental Status: She is alert and oriented to person, place, and time.             Vital Signs: BP (!) 110/55 (BP Location: Left Arm)   Pulse 74   Temp 98 F (36.7 C) (Oral)   Resp 18   Ht 5\' 1"  (1.549 m)   Wt 81.1 kg   SpO2 96%   BMI 33.77 kg/m  SpO2: SpO2: 96 % O2 Device: O2 Device:  Nasal Cannula O2 Flow Rate: O2 Flow Rate (L/min): 2 L/min  Intake/output summary:  Intake/Output Summary (Last 24 hours) at 07/04/2022 1517 Last data filed at 07/04/2022 1251 Gross per 24 hour  Intake 720 ml  Output 1550 ml  Net -830 ml   LBM: Last BM Date : 07/03/22 Baseline Weight: Weight: 85.3 kg Most recent weight: Weight: 81.1 kg       Palliative Assessment/Data: PPS 40%      Patient Active Problem List   Diagnosis Date Noted   Malnutrition of moderate degree 06/28/2022   Acute on chronic diastolic CHF (congestive heart failure) ([place]) 06/26/2022   Cirrhosis of liver (HCC) 06/26/2022   OSA (obstructive sleep apnea) 02/13/2022   Secondary hypercoagulable state ([place]) 40/98/1191   Acute systolic heart failure ([place]) 08/28/2020   Gout 08/02/2020   Constipation 08/02/2020   Thrombocytopenia ([place]) 07/23/2020   Acute on chronic combined systolic and diastolic heart failure ([place]) 07/21/2020   Acute exacerbation of congestive heart failure ([place]) 06/27/2020   Goiter    Morbid obesity ([place]) 06/19/2020   Neck mass 06/18/2020   Diabetes mellitus without complication ([place])    Persistent atrial fibrillation ([place])  Acute kidney injury superimposed on chronic kidney disease ([place])    Hypertension associated with diabetes [facility])     Palliative Care Assessment & Plan   HPI: 66 y.o. female  with past medical history of heart failure, T2DM, atrial fibrillation and chronic kidney disease admitted on 06/26/2022 with worsening lower extremity edema.  Patient diagnosed with acute on chronic CHF.  Patient has been weaned off of Lasix and milrinone infusions this admission.  Patient also with acute kidney injury with serum creatinine around 3.3.  Patient also with cirrhosis of liver related to heart failure.  PMT consulted to discuss goals of care.    Assessment: Follow-up with patient's spouse today.  Review conversation from yesterday-no questions or concerns. We reviewed her CODE STATUS  conversation -spouse again confirms that he would want DNR/DNI for patient but he ultimately would like her to make that decision.  Patient tells me she has not given this topic anymore thought and has not come to a decision.  She tells me she would like to continue these conversations at home.  We discussed a referral to outpatient palliative care and she is agreeable.  Recommendations/Plan: No hemodialysis Patient continues to consider CODE STATUS We will refer to outpatient palliative care   Thank you for allowing the Palliative Medicine Team to assist in the care of this patient.   *Please note that this is a verbal dictation therefore any spelling or grammatical errors are due to the "[place]" system interpretation.  Juel Burrow, DNP, [facility] Palliative Medicine Team Team Phone # [phone]  Pager [phone]

## 2022-07-04 NOTE — Progress Notes (Signed)
TOC medications delivered to pt.   

## 2022-07-04 NOTE — TOC CM/SW Note (Signed)
Rita [name] presents with morbid obesity that is causing restrictive thoracic disorder.  The use of the NIV will treat this patient's ventilatory defects found in his recent PFT (FVC 35% of predicted) and can reduce risk of exacerbations and future hospitalizations when used at night and during the day. All alternate devices (E0470 and E0471) have been considered and ruled out as volume requirements are not met by BiLevel devices.  An NIV with volume-targeted pressure support is necessary to prevent patient from life-threatening harm.  Interruption or failure to provide NIV would quickly lead to exacerbation of the patient's condition, hospital re-admission, and likely harm to the patient. Continued use is preferred.  Patient is able to protect their airways and clear secretions on their own. 

## 2022-07-04 NOTE — TOC Initial Note (Addendum)
Transition of Care [facility]) - Initial/Assessment Note    Patient Details  Name: Rita [name] MRN: [id number] Date of Birth: Mar 26, 1956  Transition of Care [facility]) CM/SW Contact:    Erenest Rasher, RN Phone Number: [phone] 07/04/2022, 2:10 PM  Clinical Narrative:                 HF TOC CM spoke to pt and husband at beside. Offered choice for HH (medicare.gov list with ratings placed on chart). Pt agreeable to [facility] for [facility]. Contacted [facility] rep, Amy with new referral. Accepted referral. Pt has portable oxygen in car. [place] for CPAP at home. States he will have [place] RT speak to attending. Pt may qualify for Bipap.   Pt reports her CPAP was picked up because she was not wearing enough hours per day for previous company.   [place] at [place] to make aware her PFT was complete.   Updated attending. Pt qualifies for Trilogy-NIV. Will have attending sign orders. Updated pt and husband.     Referral was sent to [facility] for [place].                                   Expected Discharge Plan: [place] Barriers to Discharge: No Barriers Identified   Patient Goals and CMS Choice Patient states their goals for this hospitalization and ongoing recovery are:: wants to get better CMS Medicare.gov Compare Post Acute Care list provided to:: Patient Choice offered to / list presented to : Patient  Expected Discharge Plan and Services Expected Discharge Plan: [place]   Discharge Planning Services: CM Consult Post Acute Care Choice: [place] arrangements for the past 2 months: Single Family Home Expected Discharge Date: 07/04/22                 DME Agency: [employer] Date DME Agency Contacted: 07/04/22 Time DME Agency Contacted: [id number] Representative spoke with at DME Agency: [place]: RN [place] Agency: [place] Date  [place]: 07/04/22 Time [place]: 41 Representative spoke with at [place]: Gevena Barre  Prior Living Arrangements/Services Living arrangements for the past 2 months: [place] Lives with:: Spouse Patient language and need for interpreter reviewed:: Yes Do you feel safe going back to the place where you live?: Yes      Need for Family Participation in Patient Care: Yes (Comment) Care giver support system in place?: Yes (comment) Current home services: DME Criminal Activity/Legal Involvement Pertinent to Current Situation/Hospitalization: No - Comment as needed  Activities of Daily Living Home Assistive Devices/Equipment: Cane (specify quad or straight) ADL Screening (condition at time of admission) Patient's cognitive ability adequate to safely complete daily activities?: Yes Is the patient deaf or have difficulty hearing?: No Does the patient have difficulty seeing, even when wearing glasses/contacts?: No Does the patient have difficulty concentrating, remembering, or making decisions?: No Patient able to express need for assistance with ADLs?: Yes Does the patient have difficulty dressing or bathing?: No Independently performs ADLs?: Yes (appropriate for developmental age) Does the patient have difficulty walking or climbing stairs?: Yes Weakness of Legs: Both Weakness of Arms/Hands: None  Permission Sought/Granted Permission sought to share information with : Case Manager, Family Supports, PCP Permission granted to share information with : Yes, Verbal Permission  Granted  Share Information with NAME: Teneil Shiller     Permission granted to share info w Relationship: husband  Permission granted to share info w Contact Information: [phone]  Emotional Assessment Appearance:: Appears stated age Attitude/Demeanor/Rapport: Engaged Affect (typically observed): Accepting Orientation: : Oriented to Self, Oriented to Place, Oriented to  Time,  Oriented to Situation   Psych Involvement: No (comment)  Admission diagnosis:  Anasarca [R60.1] AKI (acute kidney injury) ([place]) [N17.9] Acute on chronic diastolic CHF (congestive heart failure) (HCC) [I50.33] Acute on chronic congestive heart failure, unspecified heart failure type [facility]) [I50.9] Patient Active Problem List   Diagnosis Date Noted   Malnutrition of moderate degree 06/28/2022   Acute on chronic diastolic CHF (congestive heart failure) ([place]) 06/26/2022   Cirrhosis of liver ([place]) 06/26/2022   OSA (obstructive sleep apnea) 02/13/2022   Secondary hypercoagulable state ([place]) 58/08/9832   Acute systolic heart failure ([place]) 08/28/2020   Gout 08/02/2020   Constipation 08/02/2020   Thrombocytopenia ([place]) 07/23/2020   Acute on chronic combined systolic and diastolic heart failure ([place]) 07/21/2020   Acute exacerbation of congestive heart failure ([place]) 06/27/2020   Goiter    Morbid obesity ([place]) 06/19/2020   Neck mass 06/18/2020   Diabetes mellitus without complication ([place])    Persistent atrial fibrillation ([place])    Acute kidney injury superimposed on chronic kidney disease ([place])    Hypertension associated with diabetes ([place])    PCP:  [name], MD Pharmacy:   [facility] [place], [place] [address] Phone: [phone] Fax: [phone]  [facility] [address] [place] [address] Phone: [phone] Fax: [phone]     Social Determinants of Health (SDOH) Interventions    Readmission Risk Interventions    08/30/2020    9:02 AM 07/25/2020    4:05 PM  Readmission Risk Prevention Plan  Transportation Screening Complete Complete  PCP or Specialist Appt within 3-5 Days Complete Complete  HRI or Home Care Consult Complete Complete  Social Work Consult for [place] Planning/Counseling Complete Complete   Palliative Care Screening Not Applicable Not Applicable  Medication Review Press photographer) Complete Complete

## 2022-07-04 NOTE — Assessment & Plan Note (Addendum)
Patient's obesity is affecting her respiratory dynamics. Restrictive respiratory impairment.  BMI is 33. 7

## 2022-07-04 NOTE — Care Management Important Message (Signed)
Important Message  Patient Details  Name: Rita [name] MRN: [id number] Date of Birth: 17-Feb-1956   Medicare Important Message Given:  Yes     [name] 07/04/2022, 11:59 AM

## 2022-07-04 NOTE — Progress Notes (Signed)
Pt c/o UNNA boots being too tight and wanted removal. UNNA boots removed at 6am. Pt to have UNNA boots replaced later today.

## 2022-07-04 NOTE — Progress Notes (Signed)
Mobility Specialist Progress Note:   07/04/22 1307  Mobility  Activity Ambulated with assistance in hallway  Level of Assistance Standby assist, set-up cues, supervision of patient - no hands on  Assistive Device Four wheel walker  Distance Ambulated (ft) 150 ft  Activity Response Tolerated well  $Mobility charge 1 Mobility   Pt received in chair willing to participate in mobility. No complaints of pain. Left in chair with call bell in reach and all needs met.   [facility] Kalesha Irving Mobility Specialist

## 2022-07-04 NOTE — Discharge Summary (Addendum)
Physician Discharge Summary   Patient: Rita [name] MRN: [id number] DOB: 07-28-1956  Admit date:     06/26/2022  Discharge date: 07/04/22  Discharge Physician: Jimmy Picket Blasa Raisch   PCP: [name], MD   Recommendations at discharge:    Patient was placed on torsemide 100 mg bid for diuresis Carvedilol dose has been reduced and holding further after load reduction due to hypotension Not on RAS inhibition due to reduced GFR Follow up renal function as outpatient Patient will have Cpap or bilevel fit as outpatient, ordered inpatient spirometry before discharge.   I spoke with patient's husband at the bedside, we talked in detail about patient's condition, plan of care and prognosis and all questions were addressed.   Discharge Diagnoses: Principal Problem:   Acute on chronic diastolic CHF (congestive heart failure) (HCC) Active Problems:   Acute kidney injury superimposed on chronic kidney disease (HCC)   Cirrhosis of liver (HCC)   Diabetes mellitus without complication ([place])   Hypertension associated with diabetes ([place])   Malnutrition of moderate degree   OSA (obstructive sleep apnea)   Neck mass   Persistent atrial fibrillation (HCC)   Thrombocytopenia (HCC)  Resolved Problems:   * No resolved hospital problems. [facility] Course: Rita [name] was admitted to the hospital with decompensated heart failure.   66 yo female with the past medical history of heart failure, T2DM, atrial fibrillation and chronic kidney disease who presented with lower extremity edema. At home she had worsening lower extremity edema, that was refractive to increased dose of diuretic therapy. On her initial physical examination her blood pressure was 136/69, HR 74, RR 16 and 02 saturation 87%, lungs with no wheezing or rhonchi, heart with S1 and S2 present irregularly irregular, abdomen not distended and positive lower extremity edema 3+.   Na 137, K 3,7 Cl 92 bicarbonate 34,  glucose 151 bun 109 cr 3,68  BNP 999.4 High sensitive troponin 137, 144, 155  Wbc 4,3 hgb 8,6 plt 119  Urine analysis SG 1,006, 0-5 wbc   Chest radiograph with mild cardiomegaly, right lower lobe atelectasis with effusion.   EKG 77 bpm, right axis deviation, normal qtc, atrial fibrillation rhythm, with no significant ST segment or T wave changes.   Ct abdomen and pelvis with cardiomegaly, marked distention of hepatic veins, and suprarenal IVC, large right sided pleural effusion and abdominal wall edema. Positive liver cirrhosis with ascites and hypodensity in the pancreatic body.   Patient was placed on IV furosemide (drip) for diuresis  07/20 right sided thoracentesis 1,2 L drained 07/21 placed on milrinone for low output heart failure decompensation.  07/25 right heart catheterization with elevated right and left filling pressures with moderate pulmonary hypertension.   Milrinone was wean off with good toleration. Patient will be discharge home with close follow up as outpatient.   Assessment and Plan: * Acute on chronic diastolic CHF (congestive heart failure) (HCC) Echocardiogram with LV systolic function preserved 60 to 65%, with mild LVH, interventricular septum flattened in systole and diastole, RV systolic function preserved, mild enlargement of RV cavity. Severe elevation in pulmonary artery systolic pressure 81.8 mmHg. Severe left atrium dilatation. Severe tricuspid valve regurgitation.    07/25 cardiac catheterization  PA 57/21 mean 38, PCWP 22, cardiac output 6.15 and index 3,31 ([place]).   Primary pulmonary hypertension plus acute on chronic core pulmonale.   Patient had aggressive diuresis with IV furosemide and required milrinone for inotropic support.  Negative fluid balanced was achieved, -5,631  ml, since admission with improvement in her symptoms.   Limited medical therapy due to low GFR and hypotension.  Cardio renal syndrome and pulmonary hypertension.   Plan to  continue diuresis with high dose of torsemide and carvedilol. On hold afterload reduction due to risk of hypotension.   Acute on chronic hypoxemic respiratory failure due to cardiogenic pulmonary edema.  Large right pleural effusion sp thoracentesis.   Continue supplemental 02 per [place] to keep 02 saturation 92% or greater.  Patient will have Cpap or bilevel arranged as outpatient that will help her cardio pulmonary function.     Acute kidney injury superimposed on chronic kidney disease (HCC) CKD stage 4.   Patient underwent aggressive diuresis, her renal function at the time of discharge is 3,42 with serum K a 3,4 and serum bicarbonate at 40. Plan to continue diuresis with torsemide and K supplementation. Follow up renal function as outpatient.   Anemia of chronic kidney disease combined with iron deficiency. Patient had IV iron during her hospitalization with good toleration.    Cirrhosis of liver (HCC) Related to heart failure No clinical signs of decompensated liver disease.   Diabetes mellitus without complication ([place]) Patient was placed on insulin sliding scale for glucose cover and monitoring during her hospitalization.    Hypertension associated with diabetes ([place]) Continue blood pressure monitoring.   Malnutrition of moderate degree Continue with nutritional supplements.   Persistent atrial fibrillation (HCC) Rate controlled with carvedilol.  Not on anticoagulation due to history of GI bleed. Continue with aspirin.   Thrombocytopenia ([place]) History of DVT and pulmonary embolism. Patient has IVC filter. Not on anticoagulation due to GI bleeding.   Class 2 obesity Patient's obesity is affecting her respiratory dynamics. Restrictive respiratory impairment.  BMI is 33. 7         Consultants: cardiology  Procedures performed: cardiac catheterization   Disposition: Home Diet recommendation:  Discharge Diet Orders (From admission, onward)     Start      Ordered   07/04/22 0000  Diet - low sodium heart healthy        07/04/22 1309           Cardiac diet DISCHARGE MEDICATION: Allergies as of 07/04/2022       Reactions   Codeine Other (See Comments)   Mouth sores    Prednisone Other (See Comments)   Delusions    Moxifloxacin Rash        Medication List     STOP taking these medications    glipiZIDE 5 MG 24 hr tablet Commonly known as: GLUCOTROL XL   hydrALAZINE 25 MG tablet Commonly known as: APRESOLINE   isosorbide mononitrate 30 MG 24 hr tablet Commonly known as: IMDUR   isosorbide mononitrate 60 MG 24 hr tablet Commonly known as: IMDUR   metolazone 2.5 MG tablet Commonly known as: ZAROXOLYN       TAKE these medications    acetaminophen 325 MG tablet Commonly known as: TYLENOL Take 650 mg by mouth every 6 (six) hours as needed for mild pain or headache.   allopurinol 100 MG tablet Commonly known as: ZYLOPRIM Take 200 mg by mouth daily.   aspirin EC 81 MG tablet Take 81 mg by mouth daily. Swallow whole.   blood glucose meter kit and supplies Dispense based on patient and insurance preference. Use up to four times daily as directed. (FOR ICD-10 E10.9, E11.9).   carvedilol 3.125 MG tablet Commonly known as: COREG Take 1 tablet (3.125 mg total)  by mouth 2 (two) times daily with a meal. What changed:  medication strength how much to take   Fifty50 Glucose Meter 2.0 w/Device Kit See admin instructions.   multivitamin with minerals Tabs tablet Take 2 tablets by mouth daily. Alive gummy bears   Muscle Rub 10-15 % Crea Apply 1 application topically as needed for muscle pain (knees).   OXYGEN Inhale 2 L/min into the lungs at bedtime. prn   polyethylene glycol 17 g packet Commonly known as: MIRALAX / GLYCOLAX Take 17 g by mouth daily.   potassium chloride SA 20 MEQ tablet Commonly known as: KLOR-CON M Take 1 tablet (20 mEq total) by mouth 2 (two) times daily. What changed: how much to take    torsemide 100 MG tablet Commonly known as: DEMADEX Take 1 tablet (100 mg total) by mouth 2 (two) times daily. What changed: medication strength   VITAMIN D3 COMPLETE PO Take 25 mcg by mouth daily.        Follow-up Information     [facility] Follow up on 07/12/2022.   Specialty: Cardiology Why: 8/4 at 3:30 PM. Please bring all medications.  [facility] Contact information: [address] [phone] mc [place] [facility] Follow up on 08/28/2022.   Specialty: Cardiology Why: 08/28/22 at 9:20am. Please bring all medications. [facility] Contact information: [address] [phone] mc [place] [phone]        [name], MD. Daphane Shepherd on 07/10/2022.   Specialty: Internal Medicine Why: _0 :30pm Contact information: [place] [address] [address]. Follow up.   Why: Home Health RN-agency will call to arrange appt Contact information: [address]  [place] [address] [phone]                Discharge Exam: Filed Weights   07/02/22 0320 07/03/22 0528 07/04/22 0600  Weight: 85.4 kg 82.6 kg 81.1 kg   BP (!) 110/55 (BP Location: Left Arm)   Pulse 74   Temp 98 F (36.7 C) (Oral)   Resp 18   Ht _1  (1.549 m)   Wt 81.1 kg   SpO2 96%   BMI 33.77 kg/m   Patient with improvement in dyspnea and edema  Neurology awake and alert ENT with no pallor Cardiovascular with S1 and S2 present with no gallops, positive murmur at the left sternal border systolic No JVD Lungs with no wheezing or rales Abdomen not distended No lower extremity edema   Condition at discharge: stable  The results of significant diagnostics from this hospitalization (including imaging, microbiology,  ancillary and laboratory) are listed below for reference.   Imaging Studies: CARDIAC CATHETERIZATION  Result Date: 07/02/2022 1. Mildly elevated right and left heart filling pressures. 2. Moderate mixed pulmonary venous/pulmonary arterial (likely due to OHS/OSA) HTN.  3. Preserved cardiac output.   IR THORACENTESIS ASP PLEURAL SPACE W/IMG GUIDE  Result Date: 06/28/2022 INDICATION: Patient with history of congestive heart failure presents with shortness of breath, previous imaging showed right pleural effusion. Request for therapeutic and diagnostic thoracentesis. EXAM: ULTRASOUND GUIDED RIGHT THORACENTESIS MEDICATIONS: 10 mL 1% lidocaine COMPLICATIONS: None immediate. PROCEDURE: An ultrasound guided thoracentesis was thoroughly discussed with the patient and questions answered. The benefits, risks, alternatives and complications  were also discussed. The patient understands and wishes to proceed with the procedure. Written consent was obtained. Ultrasound was performed to localize and mark an adequate pocket of fluid in the right chest. The area was then prepped and draped in the normal sterile fashion. 1% Lidocaine was used for local anesthesia. Under ultrasound guidance a 6 Fr Safe-T-Centesis catheter was introduced. Thoracentesis was performed. The catheter was removed and a dressing applied. FINDINGS: A total of approximately 1.2 L of hazy amber fluid was removed. Samples were sent to the laboratory as requested by the clinical team. Post procedure chest X-ray reviewed, negative for pneumothorax. IMPRESSION: Successful ultrasound guided right thoracentesis yielding 1.2 L of pleural fluid. Read by: Durenda Guthrie, PA-C Electronically Signed   By: Aletta Edouard M.D.   On: 06/28/2022 08:08   ECHOCARDIOGRAM COMPLETE  Result Date: 06/28/2022    ECHOCARDIOGRAM REPORT   Patient Name:   Rita [name] Date of Exam: 06/27/2022 Medical Rec #:  [id number]                  Height:       61.0 in Accession  #:    [id number]                 Weight:       205.0 lb Date of Birth:  Nov 27, 1956                   BSA:          1.909 m Patient Age:    66 years                   BP:           117/56 mmHg Patient Gender: F                          HR:           70 bpm. Exam Location:  Inpatient Procedure: 2D Echo Indications:    acute diastolic chf  History:        Patient has prior history of Echocardiogram examinations, most                 recent 12/28/2021. Chronic kidney disease. Cirrhosis,                 Arrythmias:Atrial Fibrillation, Signs/Symptoms:Edema; Risk                 Factors:Diabetes, Hypertension and Sleep Apnea.  Sonographer:    Johny Chess RDCS Referring Phys: [place]  1. Left ventricular ejection fraction, by estimation, is 60 to 65%. The left ventricle has normal function. The left ventricle has no regional wall motion abnormalities. There is mild left ventricular hypertrophy of the lateral segment. Left ventricular  diastolic parameters are indeterminate. There is the interventricular septum is flattened in systole and diastole, consistent with right ventricular pressure and volume overload.  2. Right ventricular systolic function is normal. The right ventricular size is mildly enlarged. There is severely elevated pulmonary artery systolic pressure.  3. Left atrial size was severely dilated.  4. There is a hyperechoic density on the posterior mitral valve annulus measuring 0.8 x 0.7 cm. This was present on the echo 08/2020, though it has increased in size. Likely represents mital annular calcification. The mitral valve is normal in structure.  Trivial mitral valve regurgitation. No evidence of mitral stenosis.  5. Tricuspid valve  regurgitation is severe.  6. The aortic valve is tricuspid. Aortic valve regurgitation is not visualized. No aortic stenosis is present.  7. The inferior vena cava is dilated in size with <50% respiratory variability, suggesting right atrial  pressure of 15 mmHg. FINDINGS  Left Ventricle: Left ventricular ejection fraction, by estimation, is 60 to 65%. The left ventricle has normal function. The left ventricle has no regional wall motion abnormalities. The left ventricular internal cavity size was normal in size. There is  mild left ventricular hypertrophy of the lateral segment. The interventricular septum is flattened in systole and diastole, consistent with right ventricular pressure and volume overload. Left ventricular diastolic function could not be evaluated due to  atrial fibrillation. Left ventricular diastolic parameters are indeterminate. Right Ventricle: The right ventricular size is mildly enlarged. No increase in right ventricular wall thickness. Right ventricular systolic function is normal. There is severely elevated pulmonary artery systolic pressure. The tricuspid regurgitant velocity is 3.44 m/s, and with an assumed right atrial pressure of 15 mmHg, the estimated right ventricular systolic pressure is 74.0 mmHg. Left Atrium: Left atrial size was severely dilated. Right Atrium: Right atrial size was normal in size. Pericardium: There is no evidence of pericardial effusion. Mitral Valve: There is a hyperechoic density on the posterior mitral valve annulus measuring 0.8 x 0.7 cm. This was present on the echo 08/2020, though it has increased in size. Likely represents mital annular calcification. The mitral valve is normal in structure. Mild to moderate mitral annular calcification. Trivial mitral valve regurgitation. No evidence of mitral valve stenosis. Tricuspid Valve: The tricuspid valve is normal in structure. Tricuspid valve regurgitation is severe. No evidence of tricuspid stenosis. Aortic Valve: The aortic valve is tricuspid. Aortic valve regurgitation is not visualized. No aortic stenosis is present. Pulmonic Valve: The pulmonic valve was normal in structure. Pulmonic valve regurgitation is not visualized. No evidence of pulmonic  stenosis. Aorta: The aortic root is normal in size and structure. Venous: The inferior vena cava is dilated in size with less than 50% respiratory variability, suggesting right atrial pressure of 15 mmHg. IAS/Shunts: No atrial level shunt detected by color flow Doppler.  LEFT VENTRICLE PLAX 2D LVIDd:         4.10 cm LVIDs:         2.60 cm LV PW:         1.20 cm LV IVS:        0.90 cm LVOT diam:     1.70 cm LV SV:         46 LV SV Index:   24 LVOT Area:     2.27 cm  RIGHT VENTRICLE            IVC RV Basal diam:  3.40 cm    IVC diam: 3.10 cm RV S prime:     9.36 cm/s TAPSE (M-mode): 1.1 cm LEFT ATRIUM              Index        RIGHT ATRIUM           Index LA diam:        4.10 cm  2.15 cm/m   RA Area:     24.70 cm LA Vol (A2C):   116.0 ml 60.77 ml/m  RA Volume:   74.20 ml  38.87 ml/m LA Vol (A4C):   62.1 ml  32.53 ml/m LA Biplane Vol: 86.7 ml  45.42 ml/m  AORTIC VALVE LVOT Vmax:   111.00 cm/s LVOT  Vmean:  71.700 cm/s LVOT VTI:    0.202 m  AORTA Ao Root diam: 2.50 cm Ao Asc diam:  3.00 cm TRICUSPID VALVE TR Peak grad:   47.3 mmHg TR Vmax:        344.00 cm/s  SHUNTS Systemic VTI:  0.20 m Systemic Diam: 1.70 cm Skeet Latch MD Electronically signed by Skeet Latch MD Signature Date/Time: 06/28/2022/6:20:54 AM    Final    VAS [place] LOWER EXTREMITY VENOUS (DVT)  Result Date: 06/27/2022  Lower Venous DVT Study Patient Name:  Rita [name]  Date of Exam:   06/27/2022 Medical Rec #: [id number]                   Accession #:    [id number] Date of Birth: 1956/03/05                    Patient Gender: F Patient Age:   25 years Exam Location:  [facility] Procedure:      VAS [place] LOWER EXTREMITY VENOUS (DVT) Referring Phys: Nyoka Lint DOUTOVA --------------------------------------------------------------------------------  Indications: Edema.  Risk Factors: Obesity and Hx of DVT s/p IVC filter, CHF, CKD, Afib, pulmonary HTN, cirrhosis. Limitations: Body habitus and poor ultrasound/tissue interface.  Comparison Study: No previous exams available for review Performing Technologist: Jody Hill RVT, RDMS  Examination Guidelines: A complete evaluation includes B-mode imaging, spectral Doppler, color Doppler, and power Doppler as needed of all accessible portions of each vessel. Bilateral testing is considered an integral part of a complete examination. Limited examinations for reoccurring indications may be performed as noted. The reflux portion of the exam is performed with the patient in reverse Trendelenburg.  +---------+---------------+---------+-----------+----------+-------------------+ RIGHT    CompressibilityPhasicitySpontaneityPropertiesThrombus Aging      +---------+---------------+---------+-----------+----------+-------------------+ CFV      Full           No       Yes                                      +---------+---------------+---------+-----------+----------+-------------------+ SFJ      Full                                                             +---------+---------------+---------+-----------+----------+-------------------+ FV Prox  Full           No       Yes                                      +---------+---------------+---------+-----------+----------+-------------------+ FV Mid   Full           No       Yes                                      +---------+---------------+---------+-----------+----------+-------------------+ FV DistalFull           No       Yes                                      +---------+---------------+---------+-----------+----------+-------------------+  PFV      Full                                                             +---------+---------------+---------+-----------+----------+-------------------+ POP      Full           No       Yes                                      +---------+---------------+---------+-----------+----------+-------------------+ PTV      Full                                          Not well visualized +---------+---------------+---------+-----------+----------+-------------------+ PERO     Full                                         Not well visualized +---------+---------------+---------+-----------+----------+-------------------+ Pulsatile flow throughout RLE. Several varicosties seen in area of groin, patent by color and compression.  +---------+---------------+---------+-----------+----------+-------------------+ LEFT     CompressibilityPhasicitySpontaneityPropertiesThrombus Aging      +---------+---------------+---------+-----------+----------+-------------------+ CFV      Full           No       Yes                                      +---------+---------------+---------+-----------+----------+-------------------+ SFJ      Full                                                             +---------+---------------+---------+-----------+----------+-------------------+ FV Prox  Full           No       Yes                                      +---------+---------------+---------+-----------+----------+-------------------+ FV Mid   Full           No       Yes                                      +---------+---------------+---------+-----------+----------+-------------------+ FV DistalFull           No       Yes                                      +---------+---------------+---------+-----------+----------+-------------------+ PFV      Full                                                             +---------+---------------+---------+-----------+----------+-------------------+  POP      Full           No       Yes                                      +---------+---------------+---------+-----------+----------+-------------------+ PTV      Full                                         Not well visualized +---------+---------------+---------+-----------+----------+-------------------+ PERO     Full                                          Not well visualized +---------+---------------+---------+-----------+----------+-------------------+ Pulsatile flow throughout LLE.    Summary: BILATERAL: - No evidence of deep vein thrombosis seen in the lower extremities, bilaterally. -No evidence of popliteal cyst, bilaterally.   *See table(s) above for measurements and observations. Electronically signed by Jamelle Haring on 06/27/2022 at 8:22:06 PM.    Final    US RENAL  Result Date: 06/27/2022 CLINICAL DATA:  Acute renal insufficiency EXAM: RENAL / URINARY TRACT ULTRASOUND COMPLETE COMPARISON:  06/26/2022 FINDINGS: Right Kidney: Renal measurements: 9.0 x 4.8 x 4.5 cm = volume: 100 mL. Echogenicity within normal limits. No mass or hydronephrosis visualized. Left Kidney: Renal measurements: 9.1 x 4.4 x 5.1 cm = volume: 106 mL. Echogenicity within normal limits. No mass or hydronephrosis visualized. Bladder: Appears normal for degree of bladder distention. Other: None. IMPRESSION: 1. Unremarkable renal ultrasound. Electronically Signed   By: Randa Ngo M.D.   On: 06/27/2022 16:27   DG Chest 1 View  Result Date: 06/27/2022 CLINICAL DATA:  A 66 year old female presents following thoracentesis on the RIGHT post removal of 1.2 L by report. EXAM: CHEST  1 VIEW COMPARISON:  06/26/2022. FINDINGS: EKG leads project over the chest. Trachea midline. Cardiomediastinal contours with cardiac enlargement and engorgement of central pulmonary vasculature. Bibasilar airspace disease with improved aeration at the RIGHT lung base following near complete resolution of pleural fluid. No pneumothorax. On limited assessment there is no acute skeletal finding. IMPRESSION: Improved aeration at the RIGHT lung base following near complete resolution of right effusion. No pneumothorax. Persistent bibasilar airspace disease and marked cardiomegaly. Electronically Signed   By: Zetta Bills M.D.   On: 06/27/2022 14:57   CT ABDOMEN PELVIS WO  CONTRAST  Result Date: 06/26/2022 CLINICAL DATA:  Abdominal pain, lower extremity swelling EXAM: CT ABDOMEN AND PELVIS WITHOUT CONTRAST TECHNIQUE: Multidetector CT imaging of the abdomen and pelvis was performed following the standard protocol without IV contrast. Unenhanced CT was performed per clinician order. Lack of IV contrast limits sensitivity and specificity, especially for evaluation of abdominal/pelvic solid viscera. RADIATION DOSE REDUCTION: This exam was performed according to the departmental dose-optimization program which includes automated exposure control, adjustment of the mA and/or kV according to patient size and/or use of iterative reconstruction technique. COMPARISON:  05/09/2021 FINDINGS: Lower chest: Dense right lower lobe consolidation consistent with atelectasis. The large right pleural effusion increased since prior exam. Continued cardiomegaly without pericardial effusion. Hepatobiliary: Nodular contour of the liver capsule consistent with cirrhosis. No focal abnormality on this limited unenhanced exam. No evidence of cholelithiasis or cholecystitis. Pancreas: Stable 13 mm hypodensity ventral  margin pancreatic body. Otherwise unremarkable unenhanced appearance. Spleen: Unremarkable unenhanced appearance. Adrenals/Urinary Tract: No urinary tract calculi or obstructive uropathy. The adrenals and bladder are unremarkable. Stomach/Bowel: No bowel obstruction or ileus. Normal appendix. No bowel wall thickening or inflammatory change. Vascular/Lymphatic: There is marked distension of the IVC above the level of an IVC filter, as well as distension of the hepatic veins. This is slightly more pronounced since prior study, likely reflects venous reflux some cardiac dysfunction. Stable position of the IVC filter below the right renal vein. Evaluation of the vascular lumen is limited without IV contrast. Stable atherosclerosis of the aorta and its branches. No pathologic adenopathy. Reproductive:  Uterus and bilateral adnexa are unremarkable. Other: Subcutaneous edema throughout the lower anterior abdominal wall and dermal thickening, consistent with third spacing of fluid. Trace free fluid within the right upper quadrant. No free intraperitoneal gas. No abdominal wall hernia. Musculoskeletal: No acute or destructive bony lesions. Reconstructed images demonstrate no additional findings. IMPRESSION: 1. Cardiomegaly, with marked distension of the hepatic veins and suprarenal IVC compatible with cardiac dysfunction and venous reflux. 2. Large right pleural effusion with compressive right lower lobe atelectasis. 3. Subcutaneous edema and dermal wall thickening as above consistent with third spacing of fluid or venous congestion. 4. Nodular contour of the liver compatible with cirrhosis. 5. Trace ascites. 6. Stable indeterminate 13 mm hypodensity within the pancreatic body. If not previously evaluated, definitive characterization with nonemergent outpatient MRI with and without contrast may be useful. 7.  Aortic Atherosclerosis (ICD10-I70.0). Electronically Signed   By: Randa Ngo M.D.   On: 06/26/2022 17:39   DG Chest 2 View  Result Date: 06/26/2022 CLINICAL DATA:  Shortness of breath. EXAM: CHEST - 2 VIEW COMPARISON:  October 28, 2021. FINDINGS: Stable cardiomegaly. Increased right basilar opacity is noted most consistent with subsegmental atelectasis or edema with associated pleural effusion. Bony thorax is unremarkable. IMPRESSION: Increased right basilar opacity is noted most consistent with subsegmental atelectasis or asymmetric edema with associated pleural effusion. Electronically Signed   By: Marijo Conception M.D.   On: 06/26/2022 15:31    Microbiology: Results for orders placed or performed during the hospital encounter of 06/26/22  Culture, body fluid w Gram Stain-bottle     Status: None   Collection Time: 06/27/22  3:02 PM   Specimen: Pleura  Result Value Ref Range Status   Specimen  Description PLEURAL  Final   Special Requests RIGHT LUNG  Final   Culture   Final    NO GROWTH 5 DAYS Performed at [facility], [address], [place], [place] [address]    Report Status 07/02/2022 FINAL  Final  Gram stain     Status: None   Collection Time: 06/27/22  3:02 PM   Specimen: Fluid  Result Value Ref Range Status   Specimen Description FLUID  Final   Special Requests RIGHT LUNG  Final   Gram Stain   Final    WBC PRESENT, PREDOMINANTLY MONONUCLEAR NO ORGANISMS SEEN CYTOSPIN SMEAR Performed at [facility], [place] [address] [place], [place] [address]    Report Status 06/27/2022 FINAL  Final    Labs: CBC: Recent Labs  Lab 06/29/22 0312 06/30/22 0648 07/01/22 0601 07/02/22 1137 07/03/22 0127 07/04/22 0212  WBC 4.5 4.7 5.1  --  4.4 5.0  HGB 8.2* 8.5* 8.2* 10.5*  10.5* 8.8* 8.7*  HCT 24.4* 25.5* 24.5* 31.0*  31.0* 27.1* 26.8*  MCV 82.4 84.4 85.1  --  86.3 86.2  PLT 137* 139* 148*  --  143* 563*   Basic Metabolic Panel: Recent Labs  Lab 06/28/22 0340 06/29/22 0312 06/30/22 0648 07/01/22 0601 07/02/22 0208 07/02/22 1137 07/03/22 0127 07/04/22 0212  NA 139 135 139 140 139 139  139 139 138  K 3.7 3.2* 3.9 3.5 3.6 3.6  3.6 3.5 3.4*  CL 92* 87* 88* 84* 82*  --  86* 86*  CO2 35* 36* 40* >45* 43*  --  41* 40*  GLUCOSE 125* 138* 114* 103* 116*  --  123* 113*  BUN 103* 96* 87* 83* 75*  --  72* 67*  CREATININE 3.66* 3.50* 3.18* 3.36* 3.37*  --  3.53* 3.42*  CALCIUM 9.1 8.7* 9.1 9.2 9.5  --  9.2 9.2  MG 2.7*  --   --   --   --   --   --   --   PHOS  --  3.5  --   --   --   --   --   --    Liver Function Tests: Recent Labs  Lab 06/28/22 0340 06/29/22 0312  AST 20  --   ALT 13  --   ALKPHOS 79  --   BILITOT 1.9*  --   PROT 6.9  --   ALBUMIN 3.1* 3.0*   CBG: Recent Labs  Lab 07/03/22 1129 07/03/22 1619 07/03/22 2050 07/04/22 0602 07/04/22 1111  GLUCAP 154* 145* 182* 115* 181*    Discharge time spent: greater than 30  minutes.  Signed: Tawni Millers, MD [facility] 07/04/2022

## 2022-07-10 NOTE — Progress Notes (Signed)
Advanced Heart Failure Clinic Note PCP: Dr Bea Graff Nephrology: Dr. Royce Macadamia HF Cardiology: Dr Aundra Dubin   HPI: Ms Alwin is a 66 y.o. 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    She has had multiple hospitalizations in the last year.  She was admitted January 2021 for COVID-19 pneumonia w/ acute hypoxic respiratory failure.  She did not require intubation.  She was treated with steroids and remdesivir.  She was readmitted twice in 7/21 for CHF exacerbations complicated by pleural effusion requiring IR guided thoracentesis.  She was readmitted again 8/21 for recurrent CHF and diuresed with IV Lasix.  Echo 8/21 showed moderately reduced systolic function, EF 35 to 40% with mild concentric left ventricular hypertrophy and grade 2 diastolic dysfunction, mild hypokinesis of the LV apical segment, RV systolic function was normal.    Readmitted recurrent CHF exacerbation plus AKI on CKD in 9/21.  Previous creatinine last admit was 1.76. Creatinine on this admission elevated at 2.06. Chest x-ray demonstrated cardiomegaly and pulmonary venous congestion with recurrent right-sided pleural effusion.  BNP 673.  CBC showed chronic anemia, hemoglobin 8.9.  Also with worsening thrombocytopenia.  Platelets down to 84K. Echo repeated with EF 40-45%, RV ok, PASP 51 mmHg. Placed on milrinone to support RV for diuresis. Discharge weight 225 pounds. She was discharged on bidil but too expensive. Changed to Imdur + hydral. Cardiac MRI was done this admission, showed LV EF 41%, RV EF 27%,  D-shaped septum, RV insertion site LGE (RV>LV failure), no evidence for cardiac amyloidosis.   She has seen EP for Watchman evaluation given inability to take anticoagulation.  Dr. Quentin Ore thought that she is not a candidate for left atrial appendage occlusion given the risks associated with general anesthesia.  S/p Cardiomems 1/22.  Echo in 1/23 showed EF 55-60%, mild LVH, mild RV enlargement with normal systolic function, IVC dilated, PASP 68 mmHg.   Follow up 4/23, NYHA III, volume overloaded on exam. Torsemide increased to 80/40. Eventually, metolazone added back weekly.  Admitted 7/23 with a/c dHF and massive volume overload. Diuresed with IV lasix, required milrinone for RV support. Echo showed EF 60-65%, interventricular septum flattened in systole and diastole, consistent w/ RV pressure and volume overload, RV mildly enlarged, RV function normal. RHC showed mildly elevated right and left heart filling pressures, moderate mixed pulmonary venous/pulmonary arterial (likely due to OHS/OSA) HTN and preserved CO. IR consulted for R pleural effusion, s/p thoracentesis yielding 1.2 L. Nephrology consulted with worsening renal function, felt poor candidate for dialysis given RV failure. PMT consulted for [place], outpatient referral to Palliative arranged. GDMT titrated but limited by CKD, discharged home, weight 178 lbs.   Today she returns for post hospital HF follow up. Overall feeling much better. She is not short of breath walking on flat ground with her rolling walker. Main complaint is wanting to get her CPAP back. She says she had to pay money for it then it was taken away from her due to her not using it. Denies palpitations, abnormal bleeding, CP, dizziness, edema, or PND/Orthopnea. Appetite ok. No fever or chills. Taking all medications.   ECG (personally reviewed):  atrial fibrillation 62 bpm  Cardiomems: PADP 9 yesterday (goal 17)  Labs (11/21): K 4.5, creatinine 2.11 Labs (5/22): K 4.1,  creatinine 2.48 Labs (7/22): K 4.8, creatinine 2.56 Labs (8/22): K 4.1, creatinine 1.89 Labs (9/22): K 4.2, creatinine 2.25 Labs (10/22): K 4.4, creatinine 2.48 Labs (11/22): K 4.1, creatinine 2.12 Labs (2/23): LDL 74, TGs 69, hgb 9.8, K 3.6, creatinine 2.85 Labs (5/23): K 3.3, creatinine 2.66 Labs (6/23): K 3.9, creatinine 2.76 Labs (8/23): K 4.3, creatinine 3.23, hgb 10.3, Plt 156  PMH: 1. Chronic systolic CHF:  - Echo 3/55 EF 35-40%, mod HK, G2DD, RV normal - Echo 9/21 EF 40-45%, global hypokinesis, D-shaped septum, at least mildly decreased RV systolic function.  - Cardiac MRI (9/21): LV EF 41%, diffuse hypokinesis, D-shaped septum, RV mildly dilated with RV EF 27%, nonspecific RV insertion site LGE.  No evidence for cardiac amyloidosis.  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  - RHC (10/21): mean RA 9, PA 54/17 mean 33, mean PCWP 20, CI 3.33, PVR 1.9 WU - Cardiomems placed 12/2020.  RHC (1/22) with mean RA 14, PA 60/22, mean PCWP 20, CI 3.06, PVR 3.2.  - Echo (1/23): EF 55-60%, mild LVH, mild RV enlargement with normal systolic function, IVC dilated, PASP 68 mmHg.  - Echo (7/23): EF 60-65%, interventricular septum flattened in systole and diastole, consistent w/ RV pressure and volume overload, RV mildly enlarged, RV function normal, severaly elevated PASP, LA severely dilated, trivial MR,  severe TR. - RHC (7/23) mean RA 10, PA 57/21 mean 38, mean PCWP 22, CI 3.31, PVR 2.6 WU 2. CKD stage IV 3. Type 2 diabetes 4. Pleural effusion s/p thoracentesis (due to CHF) 5. HTN 6. 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  7. COVID-19 infection 8. Atrial fibrillation: Chronic.  Not anticoagulated due to chronic anemia and GI bleeding.  - Not Watchman candidate per EP.  9. Anemia: Suspect due to chronic low grade GI bleeding (small bowel AVMs).  10. OSA/OHS: Uses CPAP.  11. Left breast cancer: Treated remotely with chemo/radiation.  12. GERD 13. Chronic mild thrombocytopenia  ROS: All systems negative except as listed in  HPI, PMH and Problem List.  Social History   Socioeconomic History   Marital status: Married    Spouse name: Not on file   Number of children: Not on file   Years of education: Not on file   Highest education level: Not on file  Occupational History   Not on file  Tobacco Use   Smoking status: Never   Smokeless tobacco: Never  Vaping Use   Vaping Use: Never used  Substance and Sexual Activity   Alcohol use: Never   Drug use: Never   Sexual activity: Not on file  Other Topics Concern   Not on file  Social History Narrative   Not on file   Social Determinants of Health   Financial Resource Strain: Not on file  Food Insecurity: Not on file  Transportation Needs: No Transportation Needs (07/25/2020)   PRAPARE - Hydrologist (Medical): No    Lack of Transportation (Non-Medical): No  Physical Activity: Not on file  Stress: Not on file  Social Connections: Socially Integrated (07/25/2020)   Social Connection and Isolation Panel [NHANES]    Frequency of Communication with Friends and Family: More than three times a week    Frequency of Social Gatherings with Friends and Family: Once a week    Attends Religious Services: 1 to 4 times per year  Active Member of Clubs or Organizations: No    Attends Archivist Meetings: 1 to 4 times per year    Marital Status: Married  Human resources officer Violence: Not on file   Family History  Problem Relation Age of Onset   Cancer Mother    Current Outpatient Medications  Medication Sig Dispense Refill   acetaminophen (TYLENOL) 325 MG tablet Take 650 mg by mouth every 6 (six) hours as needed for mild pain or headache.     allopurinol (ZYLOPRIM) 100 MG tablet Take 100 mg by mouth 2 (two) times daily.     aspirin EC 81 MG tablet Take 81 mg by mouth daily. Swallow whole.     blood glucose meter kit and supplies Dispense based on patient and insurance preference. Use up to four times daily as directed. (FOR  ICD-10 E10.9, E11.9). 1 each 0   Blood Glucose Monitoring Suppl (FIFTY50 GLUCOSE METER 2.0) w/Device KIT See admin instructions.     carvedilol (COREG) 3.125 MG tablet Take 1 tablet (3.125 mg total) by mouth 2 (two) times daily with a meal. 60 tablet 0   Menthol-Methyl Salicylate (MUSCLE RUB) 10-15 % CREA Apply 1 application topically as needed for muscle pain (knees).      Multiple Vitamin (MULTIVITAMIN WITH MINERALS) TABS tablet Take 2 tablets by mouth daily. Alive gummy bears     OXYGEN Inhale 2 L/min into the lungs at bedtime. prn     pantoprazole (PROTONIX) 40 MG tablet Take 40 mg by mouth daily.     polyethylene glycol (MIRALAX / GLYCOLAX) 17 g packet Take 17 g by mouth daily.     potassium chloride SA (KLOR-CON M) 20 MEQ tablet Take 1 tablet (20 mEq total) by mouth 2 (two) times daily. 60 tablet 0   torsemide (DEMADEX) 100 MG tablet Take 1 tablet (100 mg total) by mouth 2 (two) times daily. 60 tablet 0   No current facility-administered medications for this encounter.   BP 124/62   Pulse 69   Wt 80 kg (176 lb 6.4 oz)   SpO2 93%   BMI 33.33 kg/m   Wt Readings from Last 3 Encounters:  07/12/22 80 kg (176 lb 6.4 oz)  07/04/22 81.1 kg (178 lb 11.2 oz)  06/19/22 90.6 kg (199 lb 12.8 oz)   PHYSICAL EXAM: General:  NAD. No resp difficulty, arrived in [facility], chronically-ill appearing HEENT: Normal Neck: Supple. No JVD. Carotids 2+ bilat; no bruits. No lymphadenopathy or thryomegaly appreciated. Cor: PMI nondisplaced. Irregular rate & rhythm. No rubs, gallops or murmurs. Lungs: Clear Abdomen: Soft, nontender, nondistended. No hepatosplenomegaly. No bruits or masses. Good bowel sounds. Extremities: No cyanosis, clubbing, rash, trace edema, compression hose on. Neuro: Alert & oriented x 3, cranial nerves grossly intact. Moves all 4 extremities w/o difficulty. Affect pleasant.  ASSESSMENT & PLAN: 1.  Chronic diastolic HF: Prominent signs of RV failure.  9/21 echo showed EF 40-45%, global  hypokinesis, D-shaped IV septum suggestive of RV pressure/volume overload, RV poorly visualized but appeared normal in size with at least mild systolic dysfunction. Cause of cardiomyopathy is uncertain.  She has history of COVID-19 1/21, cannot rule out viral myocarditis. Chemotherapy-mediated CMP is possible (not sure what chemo she had remotely for breast cancer).  Cardiac MRI showed LV EF 41% with diffuse hypokinesis, RV mildly dilated but with EF 27% and D-shaped septum, nonspecific RV insertion site LGE.  Suspect RV > LV failure. Cardiac MRI is not suggestive of amyloidosis, no M-spike on myeloma panel.  We have not ruled out CAD (cath deferred given lack of ischemic CP and CKD). Milrinone started primarily for RV support during admission in 9/21 and titrated off. She had cardiomems placed 1/22, goal has been 21 mmHg. Echo in 1/23 showed improved LV function, EF 55-60%, mild LVH, mild RV enlargement with normal systolic function, IVC dilated, PASP 68 mmHg. Admitted 7/23 with CHF exacerbation and AKI. Echo (7/23) showed EF 60-65%, interventricular septum flattened in systole and diastole, consistent w/ RV pressure and volume overload, RV mildly enlarged, RV function normal, severaly elevated PASP, LA severely dilated, trivial MR, severe TR. RHC showed mildly elevated right and left heart filling pressures, moderate mixed pulmonary venous/pulmonary arterial (likely due to OHS/OSA) HTN and preserved CO. Required milrinone for RV support. Today, improved NYHA II, she is not volume overloaded on exam or by Cardiomems. - Increase carvedilol to 6.25 mg bid. - Continue torsemide 100 mg bid. SCr 3.23, K 4.3 on labs yesterday. - Off hydralazine + Imdur, add back if BP back up. - GFR < 20, not a candidate for SGLT2i. - No ARNI/spironolactone with CKD 2. Chronic hypoxemic respiratory failure: OHS/OSA.  She was using home oxygen prior to COVID-19 PNA.  - Not currently wearing CPAP at home, CPAP was taken from her due  to noncompliance => will have to address this. Will send a message to Dr. Theodosia Blender office to see if we can get this sorted out for her. - Stable on O2 via [place] 3. Atrial fibrillation: Chronic, all ECGs since 7/21 show atrial fibrillation.  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 Rate is controlled. No overt bleeding.  - She cannot be cardioverted at this point as she cannot be chronically anticoagulated. - Evaluated by Dr. Quentin Ore for [place] but not a suitable candidate given multiple severe medical comorbidities and risks associated with general anesthesia. 4. HTN: BP stable on current regimen.  Increase Coreg as above. - Can add back hydralazine if BP rises.  5. Hx of GI bleeding: Recurrent, from small bowel AVMs. Unable to be anticoagulated. No recent overt bleeding. Hgb has been stable.  6. H/o DVT/PE: Provoked (post-surgery). No longer on anticoagulation due to h/o significant GIB.  Has IVC filter.  7. CKD Stage IV: Baseline SCr 2.6.  Unremarkable renal US.  - Would be difficult HD candidate with RV failure.  - SCr 3.23 on labs at PCP yesterday. 8. Right pleural effusion: s/p thoracentesis 06/27/22--> Yielded 1.2 L of hazy amber fluid, cultures NGTD.  9. DM II: On insulin.  - No SGLT2i 10. Anemia: Suspect due to chronic low grade GI bleeding (small bowel AVMs).  Has had feraheme.  - Hgb yesterday 10.3 11. Cardiac cirrhosis: CT showed nodular contour of the liver capsule consistent with cirrhosis. No focal abnormality on this limited unenhanced exam. Denies ETOH use.  Hep A,B,C (-).  - Follow up with GI 12. Thrombocytopenia: Chronic, mild.  May be due to cirrhosis.  - Plt 156 on labs yesterday. 60. GOC: She has palliative referral arranged, however she ultimately refused when she was discharged. Not a dialysis candidate with RV failure. - Long discussion about this today and the trajectory of her heart failure and kidney disease. She would like to  talk with her husband before committing.  Follow up in 6 weeks with Dr. Aundra Dubin, as scheduled.  Allena Katz, FNP-BC 07/12/22

## 2022-07-12 ENCOUNTER — Encounter (HOSPITAL_COMMUNITY): Payer: Self-pay

## 2022-07-12 ENCOUNTER — Ambulatory Visit (HOSPITAL_COMMUNITY)
Admission: RE | Admit: 2022-07-12 | Discharge: 2022-07-12 | Disposition: A | Discharge status: Home / Self Care | Payer: PPO | Source: Ambulatory Visit | Attending: Family Medicine | Admitting: Family Medicine

## 2022-07-12 VITALS — BP 124/62 | HR 69 | Wt 176.4 lb

## 2022-07-12 DIAGNOSIS — G4733 Obstructive sleep apnea (adult) (pediatric): Secondary | ICD-10-CM | POA: Insufficient documentation

## 2022-07-12 DIAGNOSIS — I10 Essential (primary) hypertension: Secondary | ICD-10-CM

## 2022-07-12 DIAGNOSIS — Z8616 Personal history of COVID-19: Secondary | ICD-10-CM | POA: Diagnosis not present

## 2022-07-12 DIAGNOSIS — Z9221 Personal history of antineoplastic chemotherapy: Secondary | ICD-10-CM | POA: Insufficient documentation

## 2022-07-12 DIAGNOSIS — I130 Hypertensive heart and chronic kidney disease with heart failure and stage 1 through stage 4 chronic kidney disease, or unspecified chronic kidney disease: Secondary | ICD-10-CM | POA: Insufficient documentation

## 2022-07-12 DIAGNOSIS — Z79899 Other long term (current) drug therapy: Secondary | ICD-10-CM | POA: Diagnosis not present

## 2022-07-12 DIAGNOSIS — I4821 Permanent atrial fibrillation: Secondary | ICD-10-CM

## 2022-07-12 DIAGNOSIS — E1122 Type 2 diabetes mellitus with diabetic chronic kidney disease: Secondary | ICD-10-CM | POA: Insufficient documentation

## 2022-07-12 DIAGNOSIS — K761 Chronic passive congestion of liver: Secondary | ICD-10-CM | POA: Insufficient documentation

## 2022-07-12 DIAGNOSIS — J90 Pleural effusion, not elsewhere classified: Secondary | ICD-10-CM

## 2022-07-12 DIAGNOSIS — J9611 Chronic respiratory failure with hypoxia: Secondary | ICD-10-CM | POA: Diagnosis not present

## 2022-07-12 DIAGNOSIS — Z8719 Personal history of other diseases of the digestive system: Secondary | ICD-10-CM

## 2022-07-12 DIAGNOSIS — Z923 Personal history of irradiation: Secondary | ICD-10-CM | POA: Insufficient documentation

## 2022-07-12 DIAGNOSIS — I482 Chronic atrial fibrillation, unspecified: Secondary | ICD-10-CM | POA: Insufficient documentation

## 2022-07-12 DIAGNOSIS — Z794 Long term (current) use of insulin: Secondary | ICD-10-CM | POA: Diagnosis not present

## 2022-07-12 DIAGNOSIS — D696 Thrombocytopenia, unspecified: Secondary | ICD-10-CM | POA: Insufficient documentation

## 2022-07-12 DIAGNOSIS — Z86711 Personal history of pulmonary embolism: Secondary | ICD-10-CM | POA: Insufficient documentation

## 2022-07-12 DIAGNOSIS — I5032 Chronic diastolic (congestive) heart failure: Secondary | ICD-10-CM | POA: Insufficient documentation

## 2022-07-12 DIAGNOSIS — Z9981 Dependence on supplemental oxygen: Secondary | ICD-10-CM | POA: Insufficient documentation

## 2022-07-12 DIAGNOSIS — D649 Anemia, unspecified: Secondary | ICD-10-CM | POA: Diagnosis not present

## 2022-07-12 DIAGNOSIS — N184 Chronic kidney disease, stage 4 (severe): Secondary | ICD-10-CM | POA: Insufficient documentation

## 2022-07-12 DIAGNOSIS — Z853 Personal history of malignant neoplasm of breast: Secondary | ICD-10-CM | POA: Diagnosis not present

## 2022-07-12 DIAGNOSIS — Z86718 Personal history of other venous thrombosis and embolism: Secondary | ICD-10-CM | POA: Diagnosis not present

## 2022-07-12 DIAGNOSIS — Z7189 Other specified counseling: Secondary | ICD-10-CM

## 2022-07-12 MED ORDER — CARVEDILOL 6.25 MG PO TABS: 6.2500 mg | ORAL_TABLET | Freq: Two times a day (BID) | ORAL | 1 refills | Status: DC

## 2022-07-12 NOTE — Patient Instructions (Signed)
Thank you for coming in today  No labs today   INCREASE Coreg to 6.25 mg 1 tablet twice daily  (You can double up on your 3.125 mg until you run out then start the 6.25 mg)  Your physician recommends that you schedule a follow-up appointment in:  Keep follow up with Dr. Aundra Dubin    Do the following things EVERYDAY: Weigh yourself in the morning before breakfast. Write it down and keep it in a log. Take your medicines as prescribed Eat low salt foods--Limit salt (sodium) to 2000 mg per day.  Stay as active as you can everyday Limit all fluids for the day to less than 2 liters    At the [facility], you and your health needs are our priority. As part of our continuing mission to provide you with exceptional heart care, we have created designated Provider Care Teams. These Care Teams include your primary Cardiologist (physician) and Advanced Practice Providers (APPs- Physician Assistants and Nurse Practitioners) who all work together to provide you with the care you need, when you need it.   You may see any of the following providers on your designated Care Team at your next follow up: Dr Glori Bickers Dr Haynes Kerns, NP Lyda Jester, [place] [facility] [place], [place] Audry Riles, PharmD   Please be sure to bring in all your medications bottles to every appointment.   If you have any questions or concerns before your next appointment please send [place] a message through [place] or call our office at [phone].    TO LEAVE A MESSAGE FOR THE NURSE SELECT OPTION 2, PLEASE LEAVE A MESSAGE INCLUDING: YOUR NAME DATE OF BIRTH CALL BACK NUMBER REASON FOR CALL**this is important as we prioritize the call backs  YOU WILL RECEIVE A CALL BACK THE SAME DAY AS LONG AS YOU CALL BEFORE 4:00 PM

## 2022-07-18 ENCOUNTER — Telehealth (HOSPITAL_COMMUNITY): Payer: Self-pay

## 2022-07-18 NOTE — Telephone Encounter (Signed)
Encouraged her to use her BIPAP machine. Patient stated she called the company and they are going to bring her out a different mask to try as the one she has is hurting her ears, drying out her mouth and giving her a headache. Advise patient that if the new mask doesn't work to reach back out to [employer] and also let us know as well

## 2022-07-24 ENCOUNTER — Telehealth: Payer: Self-pay | Admitting: *Deleted

## 2022-07-24 NOTE — Telephone Encounter (Signed)
Reached out to the patient and she was busy so she asked me if she could call me back tomorrow.

## 2022-07-24 NOTE — Telephone Encounter (Signed)
-----   Message from Payton Mccallum, RN sent at 07/12/2022  3:35 PM EDT ----- Particia Nearing,  Mrs. Ronnald Ramp was seen today in our clinic and states her CPAP was taken from her, not sure of the entire details of that. But she will need assistance to get her CPAP.  Thank you   Carvel Getting RN

## 2022-07-25 NOTE — Telephone Encounter (Signed)
Patient lost her cpap due to non compliance and was given another cpap from [facility] while she was admitted there and she was able to take that one home but she does not want it. Patient complains that it is big and bulky and blows too strong. I will try to work with [place] to get her pressures back to what they were on her first machine.

## 2022-08-28 ENCOUNTER — Encounter (HOSPITAL_COMMUNITY): Payer: Self-pay | Admitting: Cardiology

## 2022-08-28 ENCOUNTER — Ambulatory Visit (HOSPITAL_COMMUNITY)
Admission: RE | Admit: 2022-08-28 | Discharge: 2022-08-28 | Disposition: A | Discharge status: Home / Self Care | Payer: PPO | Source: Ambulatory Visit | Attending: Cardiology | Admitting: Cardiology

## 2022-08-28 VITALS — BP 122/60 | HR 74 | Wt 186.4 lb

## 2022-08-28 DIAGNOSIS — E1122 Type 2 diabetes mellitus with diabetic chronic kidney disease: Secondary | ICD-10-CM | POA: Insufficient documentation

## 2022-08-28 DIAGNOSIS — D631 Anemia in chronic kidney disease: Secondary | ICD-10-CM | POA: Diagnosis not present

## 2022-08-28 DIAGNOSIS — I130 Hypertensive heart and chronic kidney disease with heart failure and stage 1 through stage 4 chronic kidney disease, or unspecified chronic kidney disease: Secondary | ICD-10-CM | POA: Diagnosis not present

## 2022-08-28 DIAGNOSIS — Z8616 Personal history of COVID-19: Secondary | ICD-10-CM | POA: Diagnosis not present

## 2022-08-28 DIAGNOSIS — Z794 Long term (current) use of insulin: Secondary | ICD-10-CM | POA: Diagnosis not present

## 2022-08-28 DIAGNOSIS — K761 Chronic passive congestion of liver: Secondary | ICD-10-CM | POA: Diagnosis not present

## 2022-08-28 DIAGNOSIS — Z86718 Personal history of other venous thrombosis and embolism: Secondary | ICD-10-CM | POA: Insufficient documentation

## 2022-08-28 DIAGNOSIS — Z7901 Long term (current) use of anticoagulants: Secondary | ICD-10-CM | POA: Insufficient documentation

## 2022-08-28 DIAGNOSIS — I071 Rheumatic tricuspid insufficiency: Secondary | ICD-10-CM | POA: Diagnosis not present

## 2022-08-28 DIAGNOSIS — Z79899 Other long term (current) drug therapy: Secondary | ICD-10-CM | POA: Diagnosis not present

## 2022-08-28 DIAGNOSIS — Z86711 Personal history of pulmonary embolism: Secondary | ICD-10-CM | POA: Insufficient documentation

## 2022-08-28 DIAGNOSIS — G4733 Obstructive sleep apnea (adult) (pediatric): Secondary | ICD-10-CM | POA: Diagnosis not present

## 2022-08-28 DIAGNOSIS — Z9981 Dependence on supplemental oxygen: Secondary | ICD-10-CM | POA: Insufficient documentation

## 2022-08-28 DIAGNOSIS — I5032 Chronic diastolic (congestive) heart failure: Secondary | ICD-10-CM | POA: Diagnosis present

## 2022-08-28 DIAGNOSIS — J9611 Chronic respiratory failure with hypoxia: Secondary | ICD-10-CM | POA: Diagnosis not present

## 2022-08-28 DIAGNOSIS — I4821 Permanent atrial fibrillation: Secondary | ICD-10-CM

## 2022-08-28 DIAGNOSIS — J90 Pleural effusion, not elsewhere classified: Secondary | ICD-10-CM | POA: Diagnosis not present

## 2022-08-28 DIAGNOSIS — N184 Chronic kidney disease, stage 4 (severe): Secondary | ICD-10-CM | POA: Diagnosis not present

## 2022-08-28 LAB — CBC
HCT: 25 % — ABNORMAL LOW (ref 36.0–46.0)
Hemoglobin: 8.5 g/dL — ABNORMAL LOW (ref 12.0–15.0)
MCH: 29 pg (ref 26.0–34.0)
MCHC: 34 g/dL (ref 30.0–36.0)
MCV: 85.3 fL (ref 80.0–100.0)
Platelets: 95 10*3/uL — ABNORMAL LOW (ref 150–400)
RBC: 2.93 MIL/uL — ABNORMAL LOW (ref 3.87–5.11)
RDW: 15.5 % (ref 11.5–15.5)
WBC: 4.2 10*3/uL (ref 4.0–10.5)
nRBC: 0 % (ref 0.0–0.2)

## 2022-08-28 LAB — BASIC METABOLIC PANEL
Anion gap: 8 (ref 5–15)
BUN: 47 mg/dL — ABNORMAL HIGH (ref 8–23)
CO2: 29 mmol/L (ref 22–32)
Calcium: 9.1 mg/dL (ref 8.9–10.3)
Chloride: 101 mmol/L (ref 98–111)
Creatinine, Ser: 2.59 mg/dL — ABNORMAL HIGH (ref 0.44–1.00)
GFR, Estimated: 20 mL/min — ABNORMAL LOW (ref >60)
Glucose, Bld: 129 mg/dL — ABNORMAL HIGH (ref 70–99)
Potassium: 3.4 mmol/L — ABNORMAL LOW (ref 3.5–5.1)
Sodium: 138 mmol/L (ref 135–145)

## 2022-08-28 NOTE — Patient Instructions (Signed)
There has been no changes to your medications.  Labs done today, your results will be available in MyChart, we will contact you for abnormal readings.  Your physician recommends that you schedule a follow-up appointment in: 2 months  If you have any questions or concerns before your next appointment please send [place] a message through [place] or call our office at [phone].    TO LEAVE A MESSAGE FOR THE NURSE SELECT OPTION 2, PLEASE LEAVE A MESSAGE INCLUDING: YOUR NAME DATE OF BIRTH CALL BACK NUMBER REASON FOR CALL**this is important as we prioritize the call backs  YOU WILL RECEIVE A CALL BACK THE SAME DAY AS LONG AS YOU CALL BEFORE 4:00 PM  At the [facility], you and your health needs are our priority. As part of our continuing mission to provide you with exceptional heart care, we have created designated Provider Care Teams. These Care Teams include your primary Cardiologist (physician) and Advanced Practice Providers (APPs- Physician Assistants and Nurse Practitioners) who all work together to provide you with the care you need, when you need it.   You may see any of the following providers on your designated Care Team at your next follow up: Dr Glori Bickers Dr Loralie Champagne Dr. Roxana Hires, NP Lyda Jester, [place] [facility] [place], [place] Forestine Na, NP Audry Riles, PharmD   Please be sure to bring in all your medications bottles to every appointment.

## 2022-08-28 NOTE — Progress Notes (Signed)
Advanced Heart Failure Clinic Note PCP: Dr Bea Graff Nephrology: Dr. Royce Macadamia HF Cardiology: Dr Aundra Dubin   HPI: Rita [name] is a 66 y.o. 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    She has had multiple hospitalizations in the last year.  She was admitted January 2021 for COVID-19 pneumonia w/ acute hypoxic respiratory failure.  She did not require intubation.  She was treated with steroids and remdesivir.  She was readmitted twice in 7/21 for CHF exacerbations complicated by pleural effusion requiring IR guided thoracentesis.  She was readmitted again 8/21 for recurrent CHF and diuresed with IV Lasix.  Echo 8/21 showed moderately reduced systolic function, EF 35 to 40% with mild concentric left ventricular hypertrophy and grade 2 diastolic dysfunction, mild hypokinesis of the LV apical segment, RV systolic function was normal.    Readmitted recurrent CHF exacerbation plus AKI on CKD in 9/21.  Previous creatinine last admit was 1.76. Creatinine on this admission elevated at 2.06. Chest x-ray demonstrated cardiomegaly and pulmonary venous congestion with recurrent right-sided pleural effusion.  BNP 673.  CBC showed chronic anemia, hemoglobin 8.9.  Also with worsening thrombocytopenia.  Platelets down to 84K. Echo repeated with EF 40-45%, RV ok, PASP 51 mmHg. Placed on milrinone to support RV for diuresis. Discharge weight 225 pounds. She was discharged on bidil but too expensive. Changed to Imdur + hydral. Cardiac MRI was done this admission, showed LV EF 41%, RV EF 27%,  D-shaped septum, RV insertion site LGE (RV>LV failure), no evidence for cardiac amyloidosis.   She has seen EP for Watchman evaluation given inability to take anticoagulation.  Dr. Quentin Ore thought that she is not a candidate for left atrial appendage occlusion given the risks associated with general anesthesia.  S/p Cardiomems 1/22.  Echo in 1/23 showed EF 55-60%, mild LVH, mild RV enlargement with normal systolic function, IVC dilated, PASP 68 mmHg.   Follow up 4/23, NYHA III, volume overloaded on exam. Torsemide increased to 80/40. Eventually, metolazone added back weekly.  Admitted 7/23 with a/c dHF and massive volume overload. Diuresed with IV lasix, required milrinone for RV support. Echo showed EF 60-65%, interventricular septum flattened in systole and diastole, consistent w/ RV pressure and volume overload, RV mildly enlarged, RV function normal. RHC showed mildly elevated right and left heart filling pressures, moderate mixed pulmonary venous/pulmonary arterial (likely due to OHS/OSA) HTN and preserved CO. IR consulted for R pleural effusion, s/p thoracentesis yielding 1.2 L. Nephrology consulted with worsening renal function, felt poor candidate for dialysis given RV failure. PMT consulted for [place], outpatient referral to Palliative arranged. GDMT titrated but limited by CKD, discharged home, weight 178 lbs.   Today she returns for post hospital HF follow up. No dyspnea walking on flat ground.  Uses walker for balance. No dyspnea with ADLs.  No chest pain.  No lightheadedness.  She has been doing better recently in general. No BRBPR/melena. She remains off anticoagulation.   ECG (personally reviewed): atrial fibrillation, right axis deviation, IVCD 122 msec  Cardiomems: PADP 17 (goal 17)  Labs (11/21): K 4.5, creatinine 2.11 Labs (5/22): K 4.1, creatinine 2.48 Labs (7/22): K  4.8, creatinine 2.56 Labs (8/22): K 4.1, creatinine 1.89 Labs (9/22): K 4.2, creatinine 2.25 Labs (10/22): K  4.4, creatinine 2.48 Labs (11/22): K 4.1, creatinine 2.12 Labs (2/23): LDL 74, TGs 69, hgb 9.8, K 3.6, creatinine 2.85 Labs (5/23): K 3.3, creatinine 2.66 Labs (6/23): K 3.9, creatinine 2.76 Labs (8/23): K 4.3, creatinine 3.23, hgb 10.3, Plt 156  PMH: 1. Chronic systolic => diastolic CHF: She has prominent RV dysfunction.  - Echo 8/21 EF 35-40%, mod HK, G2DD, RV normal - Echo 9/21 EF 40-45%, global hypokinesis, D-shaped septum, at least mildly decreased RV systolic function.  - Cardiac MRI (9/21): LV EF 41%, diffuse hypokinesis, D-shaped septum, RV mildly dilated with RV EF 27%, nonspecific RV insertion site LGE.  No evidence for cardiac amyloidosis.  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  - RHC (10/21): mean RA 9, PA 54/17 mean 33, mean PCWP 20, CI 3.33, PVR 1.9 WU - Cardiomems placed 12/2020.  RHC (1/22) with mean RA 14, PA 60/22, mean PCWP 20, CI 3.06, PVR 3.2.  - Echo (1/23): EF 55-60%, mild LVH, mild RV enlargement with normal systolic function, IVC dilated, PASP 68 mmHg.  - Echo (7/23): EF 60-65%, interventricular septum flattened in systole and diastole, consistent w/ RV pressure and volume overload, RV mildly enlarged, RV function normal, severaly elevated PASP, LA severely dilated, trivial MR, severe TR. - RHC (7/23) mean RA 10, PA 57/21 mean 38, mean PCWP 22, CI 3.31, PVR 2.6 WU, PAPi 3.6 2. CKD stage IV 3. Type 2 diabetes 4. Pleural effusion s/p thoracentesis (due to CHF) 5. HTN 6. 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  7. COVID-19 infection 8. Atrial fibrillation: Chronic.  Not anticoagulated due to chronic anemia and GI bleeding.  - Not Watchman candidate per EP.  9. Anemia: Suspect due to chronic low grade GI bleeding (small bowel AVMs).  10. OSA/OHS: Uses CPAP.  11. Left breast cancer: Treated remotely with chemo/radiation.  12. GERD 13. Chronic mild thrombocytopenia  ROS: All systems negative except as listed in HPI, PMH and Problem List.  Social History   Socioeconomic History   Marital status:  Married    Spouse name: Not on file   Number of children: Not on file   Years of education: Not on file   Highest education level: Not on file  Occupational History   Not on file  Tobacco Use   Smoking status: Never   Smokeless tobacco: Never  Vaping Use   Vaping Use: Never used  Substance and Sexual Activity   Alcohol use: Never   Drug use: Never   Sexual activity: Not on file  Other Topics Concern   Not on file  Social History Narrative   Not on file   Social Determinants of Health   Financial Resource Strain: Not on file  Food Insecurity: Not on file  Transportation Needs: No Transportation Needs (07/25/2020)   PRAPARE - Hydrologist (Medical): No    Lack of Transportation (Non-Medical): No  Physical Activity: Not on file  Stress: Not on file  Social Connections: Socially Integrated (07/25/2020)   Social Connection and Isolation Panel [NHANES]    Frequency of Communication with Friends and Family: More than three times a week    Frequency of Social Gatherings with Friends and Family: Once a week    Attends Religious Services: 1 to 4 times per year    Active Member of [employer] or Organizations: No    Attends Archivist Meetings: 1 to 4 times  per year    Marital Status: Married  Human resources officer Violence: Not on file   Family History  Problem Relation Age of Onset   Cancer Mother    Current Outpatient Medications  Medication Sig Dispense Refill   acetaminophen (TYLENOL) 325 MG tablet Take 650 mg by mouth every 6 (six) hours as needed for mild pain or headache.     allopurinol (ZYLOPRIM) 100 MG tablet Take 100 mg by mouth 2 (two) times daily.     aspirin EC 81 MG tablet Take 81 mg by mouth daily. Swallow whole.     blood glucose meter kit and supplies Dispense based on patient and insurance preference. Use up to four times daily as directed. (FOR ICD-10 E10.9, E11.9). 1 each 0   Blood Glucose Monitoring Suppl (FIFTY50 GLUCOSE METER  2.0) w/Device KIT See admin instructions.     carvedilol (COREG) 6.25 MG tablet Take 1 tablet (6.25 mg total) by mouth 2 (two) times daily. 180 tablet 1   Menthol-Methyl Salicylate (MUSCLE RUB) 10-15 % CREA Apply 1 application topically as needed for muscle pain (knees).      Multiple Vitamin (MULTIVITAMIN WITH MINERALS) TABS tablet Take 2 tablets by mouth daily. Alive gummy bears     OXYGEN Inhale 2 L/min into the lungs at bedtime. prn     pantoprazole (PROTONIX) 40 MG tablet Take 40 mg by mouth daily.     polyethylene glycol (MIRALAX / GLYCOLAX) 17 g packet Take 17 g by mouth daily.     potassium chloride SA (KLOR-CON M) 20 MEQ tablet Take 1 tablet (20 mEq total) by mouth 2 (two) times daily. 60 tablet 0   torsemide (DEMADEX) 100 MG tablet Take 1 tablet (100 mg total) by mouth 2 (two) times daily. 60 tablet 0   No current facility-administered medications for this encounter.   BP 122/60   Pulse 74   Wt 84.6 kg (186 lb 6.4 oz)   SpO2 96%   BMI 35.22 kg/m   Wt Readings from Last 3 Encounters:  08/28/22 84.6 kg (186 lb 6.4 oz)  07/12/22 80 kg (176 lb 6.4 oz)  07/04/22 81.1 kg (178 lb 11.2 oz)   PHYSICAL EXAM: General: NAD Neck: JVP 8 cm, no thyromegaly or thyroid nodule.  Lungs: Clear to auscultation bilaterally with normal respiratory effort. CV: Nondisplaced PMI.  Heart irregular S1/S2, no S3/S4, 2/6 HSM LLSB.  No peripheral edema.  No carotid bruit.  Normal pedal pulses.  Abdomen: Soft, nontender, no hepatosplenomegaly, no distention.  Skin: Intact without lesions or rashes.  Neurologic: Alert and oriented x 3.  Psych: Normal affect. Extremities: No clubbing or cyanosis.  HEENT: Normal.   ASSESSMENT & PLAN: 1.  Chronic diastolic HF: Prominent RV failure.  9/21 echo showed EF 40-45%, global hypokinesis, D-shaped IV septum suggestive of RV pressure/volume overload, RV poorly visualized but appeared normal in size with at least mild systolic dysfunction. Cause of cardiomyopathy is  uncertain.  She has history of COVID-19 1/21, cannot rule out viral myocarditis. Chemotherapy-mediated CMP is possible (not sure what chemo she had remotely for breast cancer).  Cardiac MRI showed LV EF 41% with diffuse hypokinesis, RV mildly dilated but with EF 27% and D-shaped septum, nonspecific RV insertion site LGE.  Suspect RV > LV failure. Cardiac MRI is not suggestive of amyloidosis, no M-spike on myeloma panel. We have not ruled out CAD (cath deferred given lack of ischemic CP and CKD). Milrinone started primarily for RV support during admission in 9/21 and  titrated off. She had cardiomems placed 1/22, goal has been 21 mmHg. Echo in 1/23 showed improved LV function, EF 55-60%, mild LVH, mild RV enlargement with normal systolic function, IVC dilated, PASP 68 mmHg. Admitted 7/23 with CHF exacerbation and AKI. Echo (7/23) showed EF 60-65%, interventricular septum flattened in systole and diastole, consistent w/ RV pressure and volume overload, RV mildly enlarged, RV function normal, severaly elevated PASP, LA severely dilated, trivial MR, severe TR. RHC showed mildly elevated right and left heart filling pressures, moderate mixed pulmonary venous/pulmonary arterial (likely due to OHS/OSA) HTN and preserved CO. Required milrinone for RV support. Today, NYHA class II, she is not volume overloaded on exam or by Cardiomems (which is at goal). - Continue Coreg 6.25 mg bid. - Continue torsemide 100 mg bid. BMET today. - GFR has been < 20, not a candidate for SGLT2i, ARNI, spironolactone. 2. Chronic hypoxemic respiratory failure: OHS/OSA.  She was using home oxygen prior to COVID-19 PNA.  - Continue CPAP at night.  - Stable on O2 via [place] 3. Atrial fibrillation: Chronic, all ECGs since 7/21 show atrial fibrillation.  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 Rate is controlled. No overt bleeding.  - She cannot be cardioverted at this point as she cannot be chronically  anticoagulated. - Evaluated by Dr. Quentin Ore for [place] but not a suitable candidate given multiple severe medical comorbidities and risks associated with general anesthesia. 4. HTN: BP stable on current regimen.   5. Hx of GI bleeding: Recurrent, from small bowel AVMs. Unable to be anticoagulated. No recent overt bleeding. Hgb has been stable.  6. H/o DVT/PE: Provoked (post-surgery). No longer on anticoagulation due to h/o significant GIB.  Has IVC filter.  7. CKD Stage IV: Would be difficult HD candidate with RV failure.  - BMET today.  8. DM II: On insulin.  - No SGLT2i 9. Anemia: Suspect due to chronic low grade GI bleeding (small bowel AVMs).   - CBC today.  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 No focal abnormality on this limited unenhanced exam. Denies ETOH use.  Hep A,B,C (-).  11. Thrombocytopenia: Chronic, mild.  May be due to cirrhosis.  - CBC today.  12. Tricuspid regurgitation: Severe on last echo.   Followup 2 months with APP.   Rita [name] 08/28/2022

## 2022-09-05 ENCOUNTER — Telehealth (HOSPITAL_COMMUNITY): Payer: Self-pay | Admitting: Adult Health

## 2022-09-05 NOTE — Telephone Encounter (Signed)
  Cardiomems Remote Monitoring  S/P Cardiomems Implant   PAD Goal: 17 Most recent reading: 21 suggestive of fluid accumulation   Recommended changes: Please call and instruct to avoid high sodium foods. Add metolazone 2.5 mg as needed. Order 6 tablets. HF clinic will instruct you when to uses it.   I continue to review and analyze the patients PA pressures weekly (and more often as needed) to bring PA pressures within the optimal range.    Wenonah Milo NP-C  2:09 PM

## 2022-09-09 NOTE — Telephone Encounter (Signed)
Left vm for return call

## 2022-09-10 ENCOUNTER — Telehealth: Payer: Self-pay | Admitting: Physician Assistant

## 2022-09-10 MED ORDER — METOLAZONE 2.5 MG PO TABS: 2.5000 mg | ORAL_TABLET | ORAL | 0 refills | Status: DC

## 2022-09-10 NOTE — Addendum Note (Signed)
Addended by: Kerry Dory on: 09/10/2022 10:17 AM   Modules accepted: Orders

## 2022-09-10 NOTE — Telephone Encounter (Signed)
Pt aware.

## 2022-09-10 NOTE — Telephone Encounter (Signed)
Paged by Farr West after hours for metolazone instruction.  The prescription was written as metolazone 2.5 mg as needed.  Pharmacy was asking clarification on instruction.  Instructed to take 1 tablet as needed for 3 pound weight gain overnight or 5 pounds in 1 week.

## 2022-09-18 ENCOUNTER — Telehealth (HOSPITAL_COMMUNITY): Payer: Self-pay | Admitting: Adult Health

## 2022-09-18 NOTE — Telephone Encounter (Signed)
   Called to remind to send Cardiomems reading.   Juanice Warburton NP-C  12:18 PM

## 2022-09-25 ENCOUNTER — Telehealth (HOSPITAL_COMMUNITY): Payer: Self-pay | Admitting: Adult Health

## 2022-09-25 NOTE — Telephone Encounter (Signed)
  Cardiomems Remote Monitoring  I called Rita [name]. Says she has been eating higher sodium foods. Discussed low sodium diet.   PAD Goal: 17 Most recent reading: 23  Recommended changes: Called an instructed to take. 2.5 mg metolazone today.   I continue to review and analyze the patients PA pressures weekly (and more often as needed) to bring PA pressures within the optimal range.     Rita Picking NP-C  3:16 PM

## 2022-10-07 ENCOUNTER — Telehealth (HOSPITAL_COMMUNITY): Payer: Self-pay | Admitting: Adult Health

## 2022-10-07 NOTE — Telephone Encounter (Signed)
  Cardiomems Remote Monitoring  S/P Cardiomems Implant   PAD Goal: 17 Most recent reading: 26 suggestive of fluid accumulation  Recommended changes: Please call and instructe to take Metolazone today then every Monday.   I continue to review and analyze the patients PA pressures weekly (and more often as needed) to bring PA pressures within the optimal range.    Vernita Tague NP-C  3:29 PM

## 2022-10-08 MED ORDER — METOLAZONE 2.5 MG PO TABS: 2.5000 mg | ORAL_TABLET | ORAL | 3 refills | Status: DC

## 2022-10-08 NOTE — Addendum Note (Signed)
Addended by: Sriyan Cutting, Sharlot Gowda on: 10/08/2022 03:28 PM   Modules accepted: Orders

## 2022-10-08 NOTE — Telephone Encounter (Signed)
Pt aware.

## 2022-10-27 ENCOUNTER — Other Ambulatory Visit (HOSPITAL_COMMUNITY): Payer: Self-pay | Admitting: Family Medicine

## 2022-11-05 NOTE — Progress Notes (Signed)
[facility] Note PCP: Dr Bea Graff Nephrology: Dr. Royce Macadamia HF Cardiology: Dr Aundra Dubin   HPI: Ms Doe is a 66 y.o. 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    She has had multiple hospitalizations in the last year.  She was admitted January 2021 for COVID-19 pneumonia w/ acute hypoxic respiratory failure.  She did not require intubation.  She was treated with steroids and remdesivir.  She was readmitted twice in 7/21 for CHF exacerbations complicated by pleural effusion requiring IR guided thoracentesis.  She was readmitted again 8/21 for recurrent CHF and diuresed with IV Lasix.  Echo 8/21 showed moderately reduced systolic function, EF 35 to 40% with mild concentric left ventricular hypertrophy and grade 2 diastolic dysfunction, mild hypokinesis of the LV apical segment, RV systolic function was normal.    Readmitted recurrent CHF exacerbation plus AKI on CKD in 9/21.  Previous creatinine last admit was 1.76. Creatinine on this admission elevated at 2.06. Chest x-ray demonstrated cardiomegaly and pulmonary venous congestion with recurrent right-sided pleural effusion.  BNP 673.  CBC showed chronic anemia, hemoglobin 8.9.  Also with worsening thrombocytopenia.  Platelets down to 84K. Echo repeated with EF 40-45%, RV ok, PASP 51 mmHg. Placed on milrinone to support RV for diuresis. Discharge weight 225 pounds. She was discharged on bidil but too expensive. Changed to Imdur + hydral. Cardiac MRI was done this admission, showed LV EF 41%, RV EF 27%,  D-shaped septum, RV insertion site LGE (RV>LV failure), no evidence for cardiac amyloidosis.   She has seen EP for Watchman evaluation given inability to take anticoagulation.  Dr. Quentin Ore thought that she is not a candidate for left atrial appendage occlusion given the risks associated with general anesthesia.  S/p Cardiomems 1/22.  Echo in 1/23 showed EF 55-60%, mild LVH, mild RV enlargement with normal systolic function, IVC dilated, PASP 68 mmHg.   Follow up 4/23, NYHA III, volume overloaded on exam. Torsemide increased to 80/40. Eventually, metolazone added back weekly.  Admitted 7/23 with a/c dHF and massive volume overload. Diuresed with IV lasix, required milrinone for RV support. Echo showed EF 60-65%, interventricular septum flattened in systole and diastole, consistent w/ RV pressure and volume overload, RV mildly enlarged, RV function normal. RHC showed mildly elevated right and left heart filling pressures, moderate mixed pulmonary venous/pulmonary arterial (likely due to OHS/OSA) HTN and preserved CO. IR consulted for R pleural effusion, s/p thoracentesis yielding 1.2 L. Nephrology consulted with worsening renal function, felt poor candidate for dialysis given RV failure. PMT consulted for [place], outpatient referral to Palliative arranged. GDMT titrated but limited by CKD, discharged home, weight 178 lbs.   Today she returns for post hospital HF follow up. No dyspnea walking on flat ground.  Uses walker for balance. No dyspnea with ADLs.  No chest pain.  No lightheadedness.  She has been doing better recently in general. No BRBPR/melena. She remains off anticoagulation.   ECG (personally reviewed): atrial fibrillation, right axis deviation, IVCD 122 msec  Cardiomems: PADP 17 (goal 17)  Labs (11/21): K 4.5, creatinine 2.11 Labs (5/22): K 4.1, creatinine 2.48 Labs (7/22): K  4.8, creatinine 2.56 Labs (8/22): K 4.1, creatinine 1.89 Labs (9/22): K 4.2, creatinine 2.25 Labs (10/22): K  4.4, creatinine 2.48 Labs (11/22): K 4.1, creatinine 2.12 Labs (2/23): LDL 74, TGs 69, hgb 9.8, K 3.6, creatinine 2.85 Labs (5/23): K 3.3, creatinine 2.66 Labs (6/23): K 3.9, creatinine 2.76 Labs (8/23): K 4.3, creatinine 3.23, hgb 10.3, Plt 156  PMH: 1. Chronic systolic => diastolic CHF: She has prominent RV dysfunction.  - Echo 8/21 EF 35-40%, mod HK, G2DD, RV normal - Echo 9/21 EF 40-45%, global hypokinesis, D-shaped septum, at least mildly decreased RV systolic function.  - Cardiac MRI (9/21): LV EF 41%, diffuse hypokinesis, D-shaped septum, RV mildly dilated with RV EF 27%, nonspecific RV insertion site LGE.  No evidence for cardiac amyloidosis.  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  - RHC (10/21): mean RA 9, PA 54/17 mean 33, mean PCWP 20, CI 3.33, PVR 1.9 WU - Cardiomems placed 12/2020.  RHC (1/22) with mean RA 14, PA 60/22, mean PCWP 20, CI 3.06, PVR 3.2.  - Echo (1/23): EF 55-60%, mild LVH, mild RV enlargement with normal systolic function, IVC dilated, PASP 68 mmHg.  - Echo (7/23): EF 60-65%, interventricular septum flattened in systole and diastole, consistent w/ RV pressure and volume overload, RV mildly enlarged, RV function normal, severaly elevated PASP, LA severely dilated, trivial MR, severe TR. - RHC (7/23) mean RA 10, PA 57/21 mean 38, mean PCWP 22, CI 3.31, PVR 2.6 WU, PAPi 3.6 2. CKD stage IV 3. Type 2 diabetes 4. Pleural effusion s/p thoracentesis (due to CHF) 5. HTN 6. 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  7. COVID-19 infection 8. Atrial fibrillation: Chronic.  Not anticoagulated due to chronic anemia and GI bleeding.  - Not Watchman candidate per EP.  9. Anemia: Suspect due to chronic low grade GI bleeding (small bowel AVMs).  10. OSA/OHS: Uses CPAP.  11. Left breast cancer: Treated remotely with chemo/radiation.  12. GERD 13. Chronic mild thrombocytopenia  ROS: All systems negative except as listed in HPI, PMH and Problem List.  Social History   Socioeconomic History   Marital status:  Married    Spouse name: Not on file   Number of children: Not on file   Years of education: Not on file   Highest education level: Not on file  Occupational History   Not on file  Tobacco Use   Smoking status: Never   Smokeless tobacco: Never  Vaping Use   Vaping Use: Never used  Substance and Sexual Activity   Alcohol use: Never   Drug use: Never   Sexual activity: Not on file  Other Topics Concern   Not on file  Social History Narrative   Not on file   Social Determinants of Health   Financial Resource Strain: Not on file  Food Insecurity: Not on file  Transportation Needs: No Transportation Needs (07/25/2020)   PRAPARE - Hydrologist (Medical): No    Lack of Transportation (Non-Medical): No  Physical Activity: Not on file  Stress: Not on file  Social Connections: Socially Integrated (07/25/2020)   Social Connection and Isolation Panel [NHANES]    Frequency of Communication with Friends and Family: More than three times a week    Frequency of Social Gatherings with Friends and Family: Once a week    Attends Religious Services: 1 to 4 times per year    Active Member of [employer] or Organizations: No    Attends Archivist Meetings: 1 to 4 times  per year    Marital Status: Married  Human resources officer Violence: Not on file   Family History  Problem Relation Age of Onset   Cancer Mother    Current Outpatient Medications  Medication Sig Dispense Refill   acetaminophen (TYLENOL) 325 MG tablet Take 650 mg by mouth every 6 (six) hours as needed for mild pain or headache.     allopurinol (ZYLOPRIM) 100 MG tablet Take 100 mg by mouth 2 (two) times daily.     aspirin EC 81 MG tablet Take 81 mg by mouth daily. Swallow whole.     blood glucose meter kit and supplies Dispense based on patient and insurance preference. Use up to four times daily as directed. (FOR ICD-10 E10.9, E11.9). 1 each 0   Blood Glucose Monitoring Suppl (FIFTY50 GLUCOSE METER  2.0) w/Device KIT See admin instructions.     carvedilol (COREG) 6.25 MG tablet Take 1 tablet (6.25 mg total) by mouth 2 (two) times daily. 180 tablet 1   Menthol-Methyl Salicylate (MUSCLE RUB) 10-15 % CREA Apply 1 application topically as needed for muscle pain (knees).      metolazone (ZAROXOLYN) 2.5 MG tablet Take 1 tablet (2.5 mg total) by mouth once a week. tuesday 5 tablet 3   Multiple Vitamin (MULTIVITAMIN WITH MINERALS) TABS tablet Take 2 tablets by mouth daily. Alive gummy bears     OXYGEN Inhale 2 L/min into the lungs at bedtime. prn     pantoprazole (PROTONIX) 40 MG tablet Take 40 mg by mouth daily.     polyethylene glycol (MIRALAX / GLYCOLAX) 17 g packet Take 17 g by mouth daily.     potassium chloride SA (KLOR-CON M) 20 MEQ tablet Take 1 tablet (20 mEq total) by mouth 2 (two) times daily. 60 tablet 0   torsemide (DEMADEX) 100 MG tablet Take 1 tablet (100 mg total) by mouth 2 (two) times daily. 60 tablet 0   torsemide (DEMADEX) 20 MG tablet TAKE 5 TABLETS(100 MG) BY MOUTH TWICE DAILY 900 tablet 0   No current facility-administered medications for this visit.   There were no vitals taken for this visit.  Wt Readings from Last 3 Encounters:  08/28/22 84.6 kg (186 lb 6.4 oz)  07/12/22 80 kg (176 lb 6.4 oz)  07/04/22 81.1 kg (178 lb 11.2 oz)   PHYSICAL EXAM: General: NAD Neck: JVP 8 cm, no thyromegaly or thyroid nodule.  Lungs: Clear to auscultation bilaterally with normal respiratory effort. CV: Nondisplaced PMI.  Heart irregular S1/S2, no S3/S4, 2/6 HSM LLSB.  No peripheral edema.  No carotid bruit.  Normal pedal pulses.  Abdomen: Soft, nontender, no hepatosplenomegaly, no distention.  Skin: Intact without lesions or rashes.  Neurologic: Alert and oriented x 3.  Psych: Normal affect. Extremities: No clubbing or cyanosis.  HEENT: Normal.   ASSESSMENT & PLAN: 1.  Chronic diastolic HF: Prominent RV failure.  9/21 echo showed EF 40-45%, global hypokinesis, D-shaped IV septum  suggestive of RV pressure/volume overload, RV poorly visualized but appeared normal in size with at least mild systolic dysfunction. Cause of cardiomyopathy is uncertain.  She has history of COVID-19 1/21, cannot rule out viral myocarditis. Chemotherapy-mediated CMP is possible (not sure what chemo she had remotely for breast cancer).  Cardiac MRI showed LV EF 41% with diffuse hypokinesis, RV mildly dilated but with EF 27% and D-shaped septum, nonspecific RV insertion site LGE.  Suspect RV > LV failure. Cardiac MRI is not suggestive of amyloidosis, no M-spike on myeloma panel. We have not  ruled out CAD (cath deferred given lack of ischemic CP and CKD). Milrinone started primarily for RV support during admission in 9/21 and titrated off. She had cardiomems placed 1/22, goal has been 21 mmHg. Echo in 1/23 showed improved LV function, EF 55-60%, mild LVH, mild RV enlargement with normal systolic function, IVC dilated, PASP 68 mmHg. Admitted 7/23 with CHF exacerbation and AKI. Echo (7/23) showed EF 60-65%, interventricular septum flattened in systole and diastole, consistent w/ RV pressure and volume overload, RV mildly enlarged, RV function normal, severaly elevated PASP, LA severely dilated, trivial MR, severe TR. RHC showed mildly elevated right and left heart filling pressures, moderate mixed pulmonary venous/pulmonary arterial (likely due to OHS/OSA) HTN and preserved CO. Required milrinone for RV support. Today, NYHA class II, she is not volume overloaded on exam or by Cardiomems (which is at goal). - Continue Coreg 6.25 mg bid. - Continue torsemide 100 mg bid. BMET today. - GFR has been < 20, not a candidate for SGLT2i, ARNI, spironolactone. 2. Chronic hypoxemic respiratory failure: OHS/OSA.  She was using home oxygen prior to COVID-19 PNA.  - Continue CPAP at night.  - Stable on O2 via [place] 3. Atrial fibrillation: Chronic, all ECGs since 7/21 show atrial fibrillation.  She is not on anticoagulation due to  GI bleeding from small bowel AVMs (recurrent). Rate is controlled. No overt bleeding.  - She cannot be cardioverted at this point as she cannot be chronically anticoagulated. - Evaluated by Dr. Quentin Ore for [place] but not a suitable candidate given multiple severe medical comorbidities and risks associated with general anesthesia. 4. HTN: BP stable on current regimen.   5. Hx of GI bleeding: Recurrent, from small bowel AVMs. Unable to be anticoagulated. No recent overt bleeding. Hgb has been stable.  6. H/o DVT/PE: Provoked (post-surgery). No longer on anticoagulation due to h/o significant GIB.  Has IVC filter.  7. CKD Stage IV: Would be difficult HD candidate with RV failure.  - BMET today.  8. DM II: On insulin.  - No SGLT2i 9. Anemia: Suspect due to chronic low grade GI bleeding (small bowel AVMs).   - CBC today.  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 No focal abnormality on this limited unenhanced exam. Denies ETOH use.  Hep A,B,C (-).  11. Thrombocytopenia: Chronic, mild.  May be due to cirrhosis.  - CBC today.  12. Tricuspid regurgitation: Severe on last echo.   Followup 2 months with APP.   [place] 11/05/2022

## 2022-11-06 ENCOUNTER — Ambulatory Visit (HOSPITAL_COMMUNITY)
Admission: RE | Admit: 2022-11-06 | Discharge: 2022-11-06 | Disposition: A | Discharge status: Home / Self Care | Payer: PPO | Source: Ambulatory Visit | Attending: Family Medicine | Admitting: Family Medicine

## 2022-11-06 ENCOUNTER — Encounter (HOSPITAL_COMMUNITY): Payer: Self-pay

## 2022-11-06 VITALS — BP 138/72 | HR 74 | Wt 196.0 lb

## 2022-11-06 DIAGNOSIS — N184 Chronic kidney disease, stage 4 (severe): Secondary | ICD-10-CM | POA: Diagnosis not present

## 2022-11-06 DIAGNOSIS — K219 Gastro-esophageal reflux disease without esophagitis: Secondary | ICD-10-CM | POA: Insufficient documentation

## 2022-11-06 DIAGNOSIS — I5032 Chronic diastolic (congestive) heart failure: Secondary | ICD-10-CM | POA: Diagnosis present

## 2022-11-06 DIAGNOSIS — I4821 Permanent atrial fibrillation: Secondary | ICD-10-CM

## 2022-11-06 DIAGNOSIS — J9611 Chronic respiratory failure with hypoxia: Secondary | ICD-10-CM | POA: Diagnosis not present

## 2022-11-06 DIAGNOSIS — Z79899 Other long term (current) drug therapy: Secondary | ICD-10-CM | POA: Insufficient documentation

## 2022-11-06 DIAGNOSIS — Z86711 Personal history of pulmonary embolism: Secondary | ICD-10-CM | POA: Diagnosis not present

## 2022-11-06 DIAGNOSIS — I130 Hypertensive heart and chronic kidney disease with heart failure and stage 1 through stage 4 chronic kidney disease, or unspecified chronic kidney disease: Secondary | ICD-10-CM | POA: Insufficient documentation

## 2022-11-06 DIAGNOSIS — K761 Chronic passive congestion of liver: Secondary | ICD-10-CM | POA: Insufficient documentation

## 2022-11-06 DIAGNOSIS — Z6837 Body mass index (BMI) 37.0-37.9, adult: Secondary | ICD-10-CM | POA: Insufficient documentation

## 2022-11-06 DIAGNOSIS — E669 Obesity, unspecified: Secondary | ICD-10-CM | POA: Diagnosis not present

## 2022-11-06 DIAGNOSIS — Z7982 Long term (current) use of aspirin: Secondary | ICD-10-CM | POA: Insufficient documentation

## 2022-11-06 DIAGNOSIS — E1122 Type 2 diabetes mellitus with diabetic chronic kidney disease: Secondary | ICD-10-CM | POA: Insufficient documentation

## 2022-11-06 DIAGNOSIS — J90 Pleural effusion, not elsewhere classified: Secondary | ICD-10-CM | POA: Diagnosis not present

## 2022-11-06 DIAGNOSIS — Z853 Personal history of malignant neoplasm of breast: Secondary | ICD-10-CM | POA: Insufficient documentation

## 2022-11-06 DIAGNOSIS — G4733 Obstructive sleep apnea (adult) (pediatric): Secondary | ICD-10-CM | POA: Insufficient documentation

## 2022-11-06 DIAGNOSIS — I10 Essential (primary) hypertension: Secondary | ICD-10-CM | POA: Diagnosis not present

## 2022-11-06 DIAGNOSIS — I071 Rheumatic tricuspid insufficiency: Secondary | ICD-10-CM | POA: Diagnosis not present

## 2022-11-06 DIAGNOSIS — Z8719 Personal history of other diseases of the digestive system: Secondary | ICD-10-CM

## 2022-11-06 DIAGNOSIS — Z794 Long term (current) use of insulin: Secondary | ICD-10-CM | POA: Insufficient documentation

## 2022-11-06 DIAGNOSIS — Z8616 Personal history of COVID-19: Secondary | ICD-10-CM | POA: Insufficient documentation

## 2022-11-06 DIAGNOSIS — Z923 Personal history of irradiation: Secondary | ICD-10-CM | POA: Diagnosis not present

## 2022-11-06 DIAGNOSIS — I5042 Chronic combined systolic (congestive) and diastolic (congestive) heart failure: Secondary | ICD-10-CM | POA: Insufficient documentation

## 2022-11-06 DIAGNOSIS — I4819 Other persistent atrial fibrillation: Secondary | ICD-10-CM | POA: Diagnosis not present

## 2022-11-06 DIAGNOSIS — Z86718 Personal history of other venous thrombosis and embolism: Secondary | ICD-10-CM | POA: Diagnosis not present

## 2022-11-06 DIAGNOSIS — D696 Thrombocytopenia, unspecified: Secondary | ICD-10-CM | POA: Diagnosis not present

## 2022-11-06 DIAGNOSIS — D649 Anemia, unspecified: Secondary | ICD-10-CM | POA: Insufficient documentation

## 2022-11-06 LAB — BRAIN NATRIURETIC PEPTIDE: B Natriuretic Peptide: 846.7 pg/mL — ABNORMAL HIGH (ref 0.0–100.0)

## 2022-11-06 LAB — BASIC METABOLIC PANEL
Anion gap: 10 (ref 5–15)
BUN: 54 mg/dL — ABNORMAL HIGH (ref 8–23)
CO2: 32 mmol/L (ref 22–32)
Calcium: 9.4 mg/dL (ref 8.9–10.3)
Chloride: 101 mmol/L (ref 98–111)
Creatinine, Ser: 2.86 mg/dL — ABNORMAL HIGH (ref 0.44–1.00)
GFR, Estimated: 17 mL/min — ABNORMAL LOW (ref >60)
Glucose, Bld: 129 mg/dL — ABNORMAL HIGH (ref 70–99)
Potassium: 4.1 mmol/L (ref 3.5–5.1)
Sodium: 143 mmol/L (ref 135–145)

## 2022-11-06 NOTE — Patient Instructions (Addendum)
Thank you for coming in today  Labs were done today, if any labs are abnormal the clinic will call you No news is good news  TAKE Metolazone 2.5 mg with extra 20 potassium TODAY  Your physician recommends that you schedule a follow-up appointment in:  3 months with Dr. Kendall Flack will receive a reminder letter in the mail a few months in advance. If you don't receive a letter, please call our office to schedule the follow-up appointment.      Do the following things EVERYDAY: Weigh yourself in the morning before breakfast. Write it down and keep it in a log. Take your medicines as prescribed Eat low salt foods--Limit salt (sodium) to 2000 mg per day.  Stay as active as you can everyday Limit all fluids for the day to less than 2 liters At the [facility], you and your health needs are our priority. As part of our continuing mission to provide you with exceptional heart care, we have created designated Provider Care Teams. These Care Teams include your primary Cardiologist (physician) and Advanced Practice Providers (APPs- Physician Assistants and Nurse Practitioners) who all work together to provide you with the care you need, when you need it.   You may see any of the following providers on your designated Care Team at your next follow up: Dr Glori Bickers Dr Loralie Champagne Dr. Roxana Hires, NP Lyda Jester, [place] [facility] [place], [place] Forestine Na, NP Audry Riles, PharmD   Please be sure to bring in all your medications bottles to every appointment.  If you have any questions or concerns before your next appointment please send [place] a message through [place] or call our office at [phone].    TO LEAVE A MESSAGE FOR THE NURSE SELECT OPTION 2, PLEASE LEAVE A MESSAGE INCLUDING: YOUR NAME DATE OF BIRTH CALL BACK NUMBER REASON FOR CALL**this is important as we prioritize the call backs  YOU WILL RECEIVE A CALL BACK THE SAME DAY  AS LONG AS YOU CALL BEFORE 4:00 PM

## 2022-11-08 ENCOUNTER — Other Ambulatory Visit (HOSPITAL_COMMUNITY): Payer: Self-pay

## 2022-11-08 ENCOUNTER — Encounter (HOSPITAL_COMMUNITY): Payer: Self-pay

## 2022-11-08 ENCOUNTER — Ambulatory Visit (HOSPITAL_COMMUNITY)
Admission: RE | Admit: 2022-11-08 | Discharge: 2022-11-08 | Disposition: A | Discharge status: Home / Self Care | Payer: PPO | Source: Ambulatory Visit | Attending: Cardiology | Admitting: Cardiology

## 2022-11-08 DIAGNOSIS — I5032 Chronic diastolic (congestive) heart failure: Secondary | ICD-10-CM

## 2022-11-15 ENCOUNTER — Other Ambulatory Visit (HOSPITAL_COMMUNITY): Payer: PPO

## 2022-11-15 ENCOUNTER — Ambulatory Visit (HOSPITAL_COMMUNITY)
Admission: RE | Admit: 2022-11-15 | Discharge: 2022-11-15 | Disposition: A | Discharge status: Home / Self Care | Payer: PPO | Source: Ambulatory Visit | Attending: Internal Medicine | Admitting: Internal Medicine

## 2022-11-15 DIAGNOSIS — I5032 Chronic diastolic (congestive) heart failure: Secondary | ICD-10-CM | POA: Insufficient documentation

## 2022-11-15 LAB — BASIC METABOLIC PANEL
Anion gap: 12 (ref 5–15)
BUN: 61 mg/dL — ABNORMAL HIGH (ref 8–23)
CO2: 31 mmol/L (ref 22–32)
Calcium: 9.5 mg/dL (ref 8.9–10.3)
Chloride: 95 mmol/L — ABNORMAL LOW (ref 98–111)
Creatinine, Ser: 3.1 mg/dL — ABNORMAL HIGH (ref 0.44–1.00)
GFR, Estimated: 16 mL/min — ABNORMAL LOW (ref >60)
Glucose, Bld: 146 mg/dL — ABNORMAL HIGH (ref 70–99)
Potassium: 4 mmol/L (ref 3.5–5.1)
Sodium: 138 mmol/L (ref 135–145)

## 2022-11-19 ENCOUNTER — Telehealth (HOSPITAL_COMMUNITY): Payer: Self-pay

## 2022-11-19 NOTE — Telephone Encounter (Addendum)
Pt aware, agreeable, and verbalized understanding  Script sent for labs to be [facility] at [facility]  ----- Message from Rafael Bihari, [place] sent at 11/19/2022  2:28 PM EST ----- Please call

## 2022-12-04 ENCOUNTER — Telehealth (HOSPITAL_COMMUNITY): Payer: Self-pay | Admitting: Adult Health

## 2022-12-04 NOTE — Telephone Encounter (Signed)
  Cardiomems Remote Monitoring  S/P Cardiomems Implant   PAD Goal: 17 Most recent reading: 23 suggestive of fluid accumulation.   Recommended changes: Please instruct to take a metolazone today.   I continue to review and analyze the patients PA pressures weekly (and more often as needed) to bring PA pressures within the optimal range.     Instruct to follow low salt diet and limit fluid intake < 2 liters daily.   Gladyce Mcray NP-C  8:46 AM

## 2022-12-04 NOTE — Telephone Encounter (Signed)
Attempted to contact pt no answer unable to leave message

## 2022-12-05 NOTE — Telephone Encounter (Signed)
Pt aware and voiced understanding 

## 2022-12-11 ENCOUNTER — Other Ambulatory Visit (HOSPITAL_COMMUNITY): Payer: Self-pay | Admitting: Family Medicine

## 2022-12-12 LAB — BASIC METABOLIC PANEL
BUN/Creatinine Ratio: 21 (ref 12–28)
BUN: 71 mg/dL — ABNORMAL HIGH (ref 8–27)
CO2: 29 mmol/L (ref 20–29)
Calcium: 9.6 mg/dL (ref 8.7–10.3)
Chloride: 96 mmol/L (ref 96–106)
Creatinine, Ser: 3.46 mg/dL — ABNORMAL HIGH (ref 0.57–1.00)
Glucose: 114 mg/dL — ABNORMAL HIGH (ref 70–99)
Potassium: 4 mmol/L (ref 3.5–5.2)
Sodium: 143 mmol/L (ref 134–144)
eGFR: 14 mL/min/{1.73_m2} — ABNORMAL LOW (ref >59)

## 2022-12-17 ENCOUNTER — Other Ambulatory Visit (HOSPITAL_COMMUNITY): Payer: Self-pay

## 2022-12-17 ENCOUNTER — Telehealth (HOSPITAL_COMMUNITY): Payer: Self-pay

## 2022-12-17 DIAGNOSIS — I5032 Chronic diastolic (congestive) heart failure: Secondary | ICD-10-CM

## 2022-12-17 NOTE — Telephone Encounter (Signed)
Patient aware. Labs ordered and scheduled.

## 2022-12-23 ENCOUNTER — Ambulatory Visit (HOSPITAL_COMMUNITY)
Admission: RE | Admit: 2022-12-23 | Discharge: 2022-12-23 | Disposition: A | Discharge status: Home / Self Care | Payer: PPO | Source: Ambulatory Visit | Attending: Cardiology | Admitting: Cardiology

## 2022-12-23 DIAGNOSIS — I5032 Chronic diastolic (congestive) heart failure: Secondary | ICD-10-CM | POA: Diagnosis present

## 2022-12-23 LAB — BASIC METABOLIC PANEL
Anion gap: 11 (ref 5–15)
BUN: 76 mg/dL — ABNORMAL HIGH (ref 8–23)
CO2: 31 mmol/L (ref 22–32)
Calcium: 9.2 mg/dL (ref 8.9–10.3)
Chloride: 95 mmol/L — ABNORMAL LOW (ref 98–111)
Creatinine, Ser: 3.06 mg/dL — ABNORMAL HIGH (ref 0.44–1.00)
GFR, Estimated: 16 mL/min — ABNORMAL LOW (ref >60)
Glucose, Bld: 153 mg/dL — ABNORMAL HIGH (ref 70–99)
Potassium: 3.3 mmol/L — ABNORMAL LOW (ref 3.5–5.1)
Sodium: 137 mmol/L (ref 135–145)

## 2022-12-24 ENCOUNTER — Telehealth (HOSPITAL_COMMUNITY): Payer: Self-pay | Admitting: Adult Health

## 2022-12-24 ENCOUNTER — Telehealth (HOSPITAL_COMMUNITY): Payer: Self-pay | Admitting: *Deleted

## 2022-12-24 DIAGNOSIS — E876 Hypokalemia: Secondary | ICD-10-CM

## 2022-12-24 DIAGNOSIS — I5032 Chronic diastolic (congestive) heart failure: Secondary | ICD-10-CM

## 2022-12-24 NOTE — Telephone Encounter (Signed)
   Called to remind to send Cardiomems reading.   Rita [name] verbalized understanding and appreciated the call.   Beryle Bagsby NP_C  9:55 AM

## 2022-12-24 NOTE — Telephone Encounter (Signed)
"  Kidney function improved but K is now low. Is she taking her KCL 20 bid? If so, increase to 40 bid and increase K in diet. She needs BMET in 1-2 weeks. She also is due for follow up with Dr. Aundra Dubin".  Patient says she is "trying to take" her potassium twice daily, but admits to missing doses. Asked her to take twice daily and will repeat lab as requested. Pt verbalized understanding of same. Lab scheduled and order placed.  Will asked Arbutus Leas to call patient and schedule next f/u appointment with Dr. Aundra Dubin. Pt verbalized agreement to same.

## 2023-01-08 ENCOUNTER — Ambulatory Visit (HOSPITAL_COMMUNITY)
Admission: RE | Admit: 2023-01-08 | Discharge: 2023-01-08 | Disposition: A | Discharge status: Home / Self Care | Payer: PPO | Source: Ambulatory Visit | Attending: Internal Medicine | Admitting: Internal Medicine

## 2023-01-08 DIAGNOSIS — I5032 Chronic diastolic (congestive) heart failure: Secondary | ICD-10-CM | POA: Diagnosis present

## 2023-01-08 DIAGNOSIS — E876 Hypokalemia: Secondary | ICD-10-CM | POA: Diagnosis not present

## 2023-01-08 LAB — BASIC METABOLIC PANEL
Anion gap: 10 (ref 5–15)
BUN: 85 mg/dL — ABNORMAL HIGH (ref 8–23)
CO2: 32 mmol/L (ref 22–32)
Calcium: 9.2 mg/dL (ref 8.9–10.3)
Chloride: 95 mmol/L — ABNORMAL LOW (ref 98–111)
Creatinine, Ser: 3.22 mg/dL — ABNORMAL HIGH (ref 0.44–1.00)
GFR, Estimated: 15 mL/min — ABNORMAL LOW (ref >60)
Glucose, Bld: 242 mg/dL — ABNORMAL HIGH (ref 70–99)
Potassium: 4 mmol/L (ref 3.5–5.1)
Sodium: 137 mmol/L (ref 135–145)

## 2023-01-29 ENCOUNTER — Telehealth (HOSPITAL_COMMUNITY): Payer: Self-pay | Admitting: Cardiology

## 2023-01-29 NOTE — Telephone Encounter (Signed)
Pts spouse walked in to request an appointment Reports pt has decline in status over the past few weeks Increase in swelling +BLE Abdominal distension (causing pain and feeling bloated) request abdominal scan Decrease in appetite Increase in fatigue -pt lost family member last week and has not been doing well. Not active sits around all day  No weights available as pt has stopped weighing Reports compliance    Advised would message provider for detailed instructions/advise In the event this is not 100% all cardiac related-should contact PCP as well Advised next add on is 2/26 with Aundra Dubin- if provider felt this needed to be evaluated swiftly may need to report to ER     CardioMems  01/29/23 Goal 17 Reading 25 01/26/23 Goal 17 Reading 18 01/21/23 Goal 17 Reading 26

## 2023-01-29 NOTE — Telephone Encounter (Signed)
Increase metolazone to twice weekly (with extra KCl 20 on metolazone days). Can get abdominal US to assess for ascites.  Have her come in 2/26 to see me.

## 2023-01-29 NOTE — Telephone Encounter (Signed)
At the time of the call pt was in the ER

## 2023-01-30 DIAGNOSIS — I272 Pulmonary hypertension, unspecified: Secondary | ICD-10-CM | POA: Diagnosis not present

## 2023-01-30 DIAGNOSIS — I361 Nonrheumatic tricuspid (valve) insufficiency: Secondary | ICD-10-CM | POA: Diagnosis not present

## 2023-02-03 ENCOUNTER — Encounter (HOSPITAL_COMMUNITY): Payer: PPO | Admitting: Cardiology

## 2023-02-09 ENCOUNTER — Other Ambulatory Visit (HOSPITAL_COMMUNITY): Payer: Self-pay | Admitting: Family Medicine

## 2023-02-12 ENCOUNTER — Encounter (HOSPITAL_COMMUNITY): Payer: PPO | Admitting: Cardiology

## 2023-03-21 ENCOUNTER — Encounter (HOSPITAL_COMMUNITY): Payer: Self-pay | Admitting: Cardiology

## 2023-03-21 ENCOUNTER — Ambulatory Visit (HOSPITAL_COMMUNITY)
Admission: RE | Admit: 2023-03-21 | Discharge: 2023-03-21 | Disposition: A | Discharge status: Home / Self Care | Payer: PPO | Source: Ambulatory Visit | Attending: Cardiology | Admitting: Cardiology

## 2023-03-21 VITALS — BP 138/68 | HR 73 | Wt 175.5 lb

## 2023-03-21 DIAGNOSIS — I482 Chronic atrial fibrillation, unspecified: Secondary | ICD-10-CM | POA: Insufficient documentation

## 2023-03-21 DIAGNOSIS — Z794 Long term (current) use of insulin: Secondary | ICD-10-CM | POA: Insufficient documentation

## 2023-03-21 DIAGNOSIS — E1122 Type 2 diabetes mellitus with diabetic chronic kidney disease: Secondary | ICD-10-CM | POA: Insufficient documentation

## 2023-03-21 DIAGNOSIS — Z79899 Other long term (current) drug therapy: Secondary | ICD-10-CM | POA: Insufficient documentation

## 2023-03-21 DIAGNOSIS — Z8616 Personal history of COVID-19: Secondary | ICD-10-CM | POA: Diagnosis not present

## 2023-03-21 DIAGNOSIS — D696 Thrombocytopenia, unspecified: Secondary | ICD-10-CM | POA: Insufficient documentation

## 2023-03-21 DIAGNOSIS — I5032 Chronic diastolic (congestive) heart failure: Secondary | ICD-10-CM | POA: Diagnosis present

## 2023-03-21 DIAGNOSIS — K5521 Angiodysplasia of colon with hemorrhage: Secondary | ICD-10-CM | POA: Insufficient documentation

## 2023-03-21 DIAGNOSIS — J9611 Chronic respiratory failure with hypoxia: Secondary | ICD-10-CM | POA: Insufficient documentation

## 2023-03-21 DIAGNOSIS — I071 Rheumatic tricuspid insufficiency: Secondary | ICD-10-CM | POA: Insufficient documentation

## 2023-03-21 DIAGNOSIS — D649 Anemia, unspecified: Secondary | ICD-10-CM | POA: Diagnosis not present

## 2023-03-21 DIAGNOSIS — Z853 Personal history of malignant neoplasm of breast: Secondary | ICD-10-CM | POA: Diagnosis not present

## 2023-03-21 DIAGNOSIS — I132 Hypertensive heart and chronic kidney disease with heart failure and with stage 5 chronic kidney disease, or end stage renal disease: Secondary | ICD-10-CM | POA: Insufficient documentation

## 2023-03-21 DIAGNOSIS — G4733 Obstructive sleep apnea (adult) (pediatric): Secondary | ICD-10-CM | POA: Insufficient documentation

## 2023-03-21 DIAGNOSIS — Z992 Dependence on renal dialysis: Secondary | ICD-10-CM | POA: Insufficient documentation

## 2023-03-21 DIAGNOSIS — N186 End stage renal disease: Secondary | ICD-10-CM | POA: Insufficient documentation

## 2023-03-21 DIAGNOSIS — K761 Chronic passive congestion of liver: Secondary | ICD-10-CM | POA: Diagnosis not present

## 2023-03-21 DIAGNOSIS — Z86711 Personal history of pulmonary embolism: Secondary | ICD-10-CM | POA: Diagnosis not present

## 2023-03-21 DIAGNOSIS — Z86718 Personal history of other venous thrombosis and embolism: Secondary | ICD-10-CM | POA: Diagnosis not present

## 2023-03-21 NOTE — Patient Instructions (Signed)
No changes to medications.   Your physician has requested that you have an echocardiogram. Echocardiography is a painless test that uses sound waves to create images of your heart. It provides your doctor with information about the size and shape of your heart and how well your heart's chambers and valves are working. This procedure takes approximately one hour. There are no restrictions for this procedure. Please do NOT wear cologne, perfume, aftershave, or lotions (deodorant is allowed). Please arrive 15 minutes prior to your appointment time.   Your physician recommends that you schedule a follow-up appointment in: 4 months ( August ) ** please call the office in June in arrange follow up appointment **   If you have any questions or concerns before your next appointment please send [place] a message through [place] or call our office at [phone].    TO LEAVE A MESSAGE FOR THE NURSE SELECT OPTION 2, PLEASE LEAVE A MESSAGE INCLUDING: YOUR NAME DATE OF BIRTH CALL BACK NUMBER REASON FOR CALL**this is important as we prioritize the call backs  YOU WILL RECEIVE A CALL BACK THE SAME DAY AS LONG AS YOU CALL BEFORE 4:00 PM   At the [facility], you and your health needs are our priority. As part of our continuing mission to provide you with exceptional heart care, we have created designated Provider Care Teams. These Care Teams include your primary Cardiologist (physician) and Advanced Practice Providers (APPs- Physician Assistants and Nurse Practitioners) who all work together to provide you with the care you need, when you need it.   You may see any of the following providers on your designated Care Team at your next follow up: Dr Arvilla Meres Dr Marca Ancona Dr. Marcos Eke, NP Robbie Lis, [place] [facility] [place], [place] Brynda Peon, NP Karle Plumber, PharmD   Please be sure to bring in all your medications bottles to every appointment.     Thank you for choosing [facility]

## 2023-03-23 NOTE — Progress Notes (Signed)
Advanced Heart Failure Clinic Note PCP: Dr Shary Decamp Nephrology: Dr. Malen Gauze HF Cardiology: Dr Shirlee Latch   HPI: Ms Benavidez is a 67 y.o. 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    She has had multiple hospitalizations in the last year.  She was admitted January 2021 for COVID-19 pneumonia w/ acute hypoxic respiratory failure.  She did not require intubation.  She was treated with steroids and remdesivir.  She was readmitted twice in 7/21 for CHF exacerbations complicated by pleural effusion requiring IR guided thoracentesis.  She was readmitted again 8/21 for recurrent CHF and diuresed with IV Lasix.  Echo 8/21 showed moderately reduced systolic function, EF 35 to 40% with mild concentric left ventricular hypertrophy and grade 2 diastolic dysfunction, mild hypokinesis of the LV apical segment, RV systolic function was normal.    Readmitted recurrent CHF exacerbation plus AKI on CKD in 9/21.  Previous creatinine last admit was 1.76. Creatinine on this admission elevated at 2.06. Chest x-ray demonstrated cardiomegaly and pulmonary venous congestion with recurrent right-sided pleural effusion.  BNP 673.  CBC showed chronic anemia, hemoglobin 8.9.  Also with worsening thrombocytopenia.  Platelets down to 84K. Echo repeated with EF 40-45%, RV ok, PASP 51 mmHg. Placed on milrinone to support RV for diuresis. Discharge weight 225 pounds. She was discharged on bidil but too expensive. Changed to Imdur + hydral. Cardiac MRI was done this admission, showed LV EF 41%, RV EF 27%,  D-shaped septum, RV insertion site LGE (RV>LV failure), no evidence for cardiac amyloidosis.   She has seen EP for Watchman evaluation given inability to take anticoagulation.  Dr. Lalla Brothers thought that she is not a candidate for left atrial appendage occlusion given the risks associated with general anesthesia.  S/p Cardiomems 1/22.  Echo in 1/23 showed EF 55-60%, mild LVH, mild RV enlargement with normal systolic function, IVC dilated, PASP 68 mmHg.   Follow up 4/23, NYHA III, volume overloaded on exam. Torsemide increased to 80/40. Eventually, metolazone added back weekly.  Admitted 7/23 with a/c dHF and massive volume overload. Diuresed with IV lasix, required milrinone for RV support. Echo showed EF 60-65%, interventricular septum flattened in systole and diastole, consistent w/ RV pressure and volume overload, RV mildly enlarged, RV function normal. RHC showed mildly elevated right and left heart filling pressures, moderate mixed pulmonary venous/pulmonary arterial (likely due to OHS/OSA) HTN and preserved CO. IR consulted for R pleural effusion, s/p thoracentesis yielding 1.2 L. Nephrology consulted with worsening renal function, felt poor candidate for dialysis given RV failure. PMT consulted for GOC, outpatient referral to Palliative arranged. GDMT titrated but limited by CKD, discharged home, weight 178 lbs.   Patient was admitted to [facility] in 2/24 with CHF and PNA. She was anemic and received 1 unit PRBCs.  She had progressive renal failure and was started on HD this admission.  Echo in 2/24 showed EF 50-55% with mild-moderate RV enlargement and mild RV dysfunction.   Today she returns for HF follow up with her husband. Her breathing is better now that she is on HD.  She is able to walk with cane or walker without  dyspnea on flat ground.  Recently was able to walk around mall with her daughter.  No chest pain. No lightheadedness.  She says that she has not had trouble with her BP  at HD. Weight is down 21 lbs.  She is still taking torsemide 100 mg bid and metolazone once weekly.  She does make some urine.  She hopes to come back off dialysis at some point.  She is not using her CPAP, having trouble with her machine. No BRBPR/melena. Using oxygen only prn at this point.   ECG (personally reviewed): atrial fibrillation, LPFB, IVCD 122 msec.   Labs (11/21): K 4.5, creatinine 2.11 Labs (5/22): K 4.1, creatinine 2.48 Labs (7/22): K 4.8, creatinine 2.56 Labs (8/22): K 4.1, creatinine 1.89 Labs (9/22): K 4.2, creatinine 2.25 Labs (10/22): K 4.4, creatinine 2.48 Labs (11/22): K 4.1, creatinine 2.12 Labs (2/23): LDL 74, TGs 69, hgb 9.8, K 3.6, creatinine 2.85 Labs (5/23): K 3.3, creatinine 2.66 Labs (6/23): K 3.9, creatinine 2.76 Labs (8/23): K 4.3, creatinine 3.23, hgb 10.3, Plt 156 Labs (9/23): K 3.4, creatinine 2.59 Labs (2/24): LDL 45, LFTs normal, Hgb 9  PMH: 1. Chronic systolic => diastolic CHF: She has prominent RV dysfunction.  - Echo 8/21 EF 35-40%, mod HK, G2DD, RV normal - Echo 9/21 EF 40-45%, global hypokinesis, D-shaped septum, at least mildly decreased RV systolic function.  - Cardiac MRI (9/21): LV EF 41%, diffuse hypokinesis, D-shaped septum, RV mildly dilated with RV EF 27%, nonspecific RV insertion site LGE.  No evidence for cardiac amyloidosis.  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  - RHC (10/21): mean RA 9, PA 54/17 mean 33, mean PCWP 20, CI 3.33, PVR 1.9 WU - Cardiomems placed 12/2020.  RHC (1/22) with mean RA 14, PA 60/22, mean PCWP 20, CI 3.06, PVR 3.2.  - Echo (1/23): EF 55-60%, mild LVH, mild RV enlargement with normal systolic function, IVC dilated, PASP 68 mmHg.  - Echo (7/23): EF 60-65%, interventricular septum flattened in systole and diastole, consistent w/ RV pressure and volume overload, RV mildly enlarged, RV function normal, severaly elevated PASP, LA severely dilated, trivial MR, severe TR. - RHC (7/23) mean RA 10, PA 57/21 mean 38, mean PCWP 22, CI 3.31, PVR 2.6  WU, PAPi 3.6 - Echo (2/24): EF 50-55% with mild-moderate RV enlargement and mild RV dysfunction 2. ESRD on HD 3. Type 2 diabetes 4. Pleural effusion s/p thoracentesis (due to CHF) 5. HTN 6. 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  7. COVID-19 infection 8. Atrial fibrillation: Chronic.  Not anticoagulated due to chronic anemia and GI bleeding.  - Not Watchman candidate per EP.  9. Anemia: Suspect due to chronic low grade GI bleeding (small bowel AVMs).  10. OSA/OHS: Uses CPAP.  11. Left breast cancer: Treated remotely with chemo/radiation.  12. GERD 13. Chronic mild thrombocytopenia  ROS: All systems negative except as listed in HPI, PMH and Problem List.  Social History   Socioeconomic History   Marital status: Married    Spouse name: Not on file   Number of children: Not on file   Years of education: Not on file   Highest education level: Not on file  Occupational History   Not on file  Tobacco Use   Smoking status: Never   Smokeless tobacco: Never  Vaping Use   Vaping Use: Never used  Substance and Sexual Activity   Alcohol use: Never   Drug use: Never   Sexual activity: Not on file  Other Topics Concern   Not on file  Social History Narrative   Not on file   Social Determinants of Health   Financial Resource Strain: Not on file  Food Insecurity: Not on file  Transportation Needs: No Transportation Needs (07/25/2020)   PRAPARE - Administrator, Civil Service (Medical): No    Lack of Transportation (Non-Medical): No  Physical Activity: Not on file  Stress: Not on file  Social Connections: Socially Integrated (07/25/2020)   Social Connection and Isolation Panel [NHANES]    Frequency of Communication with Friends and Family: More than three times a week    Frequency of Social Gatherings with Friends and Family: Once a week    Attends Religious Services: 1 to 4 times per year    Active Member of [employer] or Organizations: No    Attends Engineer, structural:  1 to 4 times per year    Marital Status: Married  Catering manager Violence: Not on file   Family History  Problem Relation Age of Onset   Cancer Mother    Current Outpatient Medications  Medication Sig Dispense Refill   acetaminophen (TYLENOL) 325 MG tablet Take 650 mg by mouth every 6 (six) hours as needed for mild pain or headache.     allopurinol (ZYLOPRIM) 100 MG tablet Take 100 mg by mouth 2 (two) times daily.     aspirin EC 81 MG tablet Take 81 mg by mouth daily. Swallow whole.     blood glucose meter kit and supplies Dispense based on patient and insurance preference. Use up to four times daily as directed. (FOR ICD-10 E10.9, E11.9). 1 each 0   Blood Glucose Monitoring Suppl (FIFTY50 GLUCOSE METER 2.0) w/Device KIT See admin instructions.     carvedilol (COREG) 6.25 MG tablet TAKE 1 TABLET(6.25 MG) BY MOUTH TWICE DAILY 180 tablet 1   ferrous sulfate 324 MG TBEC Take 324 mg by mouth 3 (three) times a week.     Menthol-Methyl Salicylate (MUSCLE RUB) 10-15 % CREA Apply 1 application topically as needed for muscle pain (knees).      metolazone (ZAROXOLYN) 2.5 MG tablet Take 1 tablet (2.5 mg total) by mouth once a week. tuesday 5 tablet 3   Multiple Vitamin (MULTIVITAMIN WITH MINERALS) TABS tablet Take 2 tablets by mouth daily. Alive gummy bears     OXYGEN Inhale 2 L/min into the lungs at bedtime. prn     pantoprazole (PROTONIX) 40 MG tablet Take 40 mg by mouth daily.     polyethylene glycol (MIRALAX / GLYCOLAX) 17 g packet Take 17 g by mouth daily.     torsemide (DEMADEX) 100 MG tablet Take 1 tablet (100 mg total) by mouth 2 (two) times daily. 60 tablet 0   No current facility-administered medications for this encounter.   BP 138/68   Pulse 73   Wt 79.6 kg (175 lb 8 oz)   SpO2 98%   BMI 33.16 kg/m   Wt Readings from Last 3 Encounters:  03/21/23 79.6 kg (175 lb 8 oz)  11/06/22 88.9 kg (196 lb)  08/28/22 84.6 kg (186 lb 6.4 oz)   PHYSICAL EXAM: General: NAD Neck: JVP 10 cm,  no thyromegaly or thyroid nodule.  Lungs: Clear to auscultation bilaterally with normal respiratory effort. CV: Nondisplaced PMI.  Heart irregular S1/S2, no S3/S4, no murmur.  No peripheral edema.  No carotid bruit.  Normal pedal pulses.  Abdomen: Soft, nontender, no hepatosplenomegaly, no distention.  Skin: Intact without lesions or rashes.  Neurologic: Alert and oriented x 3.  Psych: Normal affect. Extremities: No clubbing or cyanosis.  HEENT: Normal.   ASSESSMENT & PLAN: 1.  Chronic diastolic HF: Prominent RV failure.  9/21 echo showed EF 40-45%, global hypokinesis, D-shaped IV septum suggestive of RV pressure/volume overload, RV poorly visualized but appeared normal in size with at least mild systolic dysfunction. Cause of cardiomyopathy is uncertain.  She has history of COVID-19 1/21, cannot rule out viral myocarditis. Chemotherapy-mediated CMP is possible (not sure what chemo she had remotely for breast cancer).  Cardiac MRI showed LV EF 41% with diffuse hypokinesis, RV mildly dilated but with EF 27% and D-shaped septum, nonspecific RV insertion site LGE.  Suspect RV > LV failure. Cardiac MRI is not suggestive of amyloidosis, no M-spike on myeloma panel. We have not ruled out CAD (cath deferred given lack of ischemic CP and CKD). Milrinone started primarily for RV support during admission in 9/21 and titrated off. She had cardiomems placed 1/22, goal has been 21 mmHg. Echo in 1/23 showed improved LV function, EF 55-60%, mild LVH, mild RV enlargement with normal systolic function, IVC dilated, PASP 68 mmHg. Admitted 7/23 with CHF exacerbation and AKI. Echo (7/23) showed EF 60-65%, interventricular septum flattened in systole and diastole, consistent w/ RV pressure and volume overload, RV mildly enlarged, RV function normal, severaly elevated PASP, LA severely dilated, trivial MR, severe TR. RHC showed mildly elevated right and left heart filling pressures, moderate mixed pulmonary venous/pulmonary  arterial (likely due to OHS/OSA) HTN and preserved CO. Required milrinone for RV support. Most recent echo in 2/24 showed EF 50-55% with mild-moderate RV enlargement and mild RV dysfunction.  She is now on HD.  She does look volume overloaded on exam though reports NYHA class II symptoms.  - She is still making some urine per her report and takes torsemide 100 mg bid + metolazone 2.5 weekly.  She can continue this as long as nephrology thinks there is utility.  - With volume overload still on exam though symptoms improved, would suggest lowering dry weight at HD.  - Continue Coreg 6.25 mg bid. - Repeat echo to assess RV on HD.  - Not using Cardiomems with ESRD.  2. Chronic hypoxemic respiratory failure: OHS/OSA.  She is now only using oxygen prn.  She has been having trouble with her CPAP machine.  - Will contact sleep medicine to see if CPAP machine can be adjusted.  3. Atrial fibrillation: Chronic, all ECGs since 7/21 show atrial fibrillation.  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 Rate is controlled. No overt bleeding but required transfusion at 2/24 admission.  - She cannot be cardioverted at this point as she cannot be chronically anticoagulated. - Evaluated by Dr. Lalla Brothers for Watchman but not a suitable candidate given multiple severe medical comorbidities and risks associated with general anesthesia. 4. HTN: BP stable.   5. Hx of GI bleeding: Recurrent, from small bowel AVMs. Unable to be anticoagulated. She had transfusion last in 2/24.  6. H/o DVT/PE: Provoked (post-surgery). No longer on anticoagulation due to h/o significant GIB.  Has IVC filter.  7. ESRD: On HD since 2/24. She wants to come off dialysis but I suspect that she would not be able to control volume overload. At this point, think she needs a lower dry weight.  8. DM II: On insulin.  9. Anemia: Suspect due to chronic low grade GI bleeding (small bowel AVMs).   - Follow CBC.  10.  Cardiac cirrhosis: CT showed nodular  contour of the liver capsule consistent with cirrhosis. No focal abnormality on this limited unenhanced exam. Denies ETOH use.  Hep A,B,C (-).  11. Thrombocytopenia: Chronic, mild.  May be due to cirrhosis.  12. Tricuspid regurgitation: Severe in past.  Repeat echo as above to follow RV/TR.   Followup in 4 months.   Marca Ancona 03/23/2023

## 2023-03-28 ENCOUNTER — Telehealth: Payer: Self-pay | Admitting: *Deleted

## 2023-03-28 NOTE — Telephone Encounter (Signed)
-----   Message from Quintella Reichert, MD sent at 03/23/2023  5:21 PM EDT ----- Coralee North  Please call her to find out what is going on ----- Message ----- From: Laurey Morale, MD Sent: 03/23/2023  10:08 AM EDT To: Quintella Reichert, MD  Having trouble with CPAP and not using.  Could someone contact her about trouble-shooting? Thanks.

## 2023-03-28 NOTE — Telephone Encounter (Signed)
Reached out to patient but was not able to leave a message on her home or cell phone but I was able to leave a message on her husbands phone to call us back at [phone] so we can help troubleshoot her problem.

## 2023-04-08 ENCOUNTER — Ambulatory Visit (HOSPITAL_COMMUNITY)
Admission: RE | Admit: 2023-04-08 | Discharge: 2023-04-08 | Disposition: A | Discharge status: Home / Self Care | Payer: PPO | Source: Ambulatory Visit | Attending: Cardiology | Admitting: Cardiology

## 2023-04-08 DIAGNOSIS — I110 Hypertensive heart disease with heart failure: Secondary | ICD-10-CM | POA: Insufficient documentation

## 2023-04-08 DIAGNOSIS — I081 Rheumatic disorders of both mitral and tricuspid valves: Secondary | ICD-10-CM | POA: Diagnosis not present

## 2023-04-08 DIAGNOSIS — I4891 Unspecified atrial fibrillation: Secondary | ICD-10-CM | POA: Diagnosis not present

## 2023-04-08 DIAGNOSIS — I5032 Chronic diastolic (congestive) heart failure: Secondary | ICD-10-CM | POA: Insufficient documentation

## 2023-04-08 DIAGNOSIS — E119 Type 2 diabetes mellitus without complications: Secondary | ICD-10-CM | POA: Diagnosis not present

## 2023-04-08 LAB — ECHOCARDIOGRAM COMPLETE
Calc EF: 44.3 %
S' Lateral: 2.7 cm
Single Plane A2C EF: 47.7 %
Single Plane A4C EF: 45.5 %

## 2023-04-08 NOTE — Progress Notes (Signed)
Echocardiogram 2D Echocardiogram has been performed.  Rita [name] 04/08/2023, 9:57 AM

## 2023-05-12 ENCOUNTER — Other Ambulatory Visit: Payer: Self-pay | Admitting: *Deleted

## 2023-05-12 DIAGNOSIS — N186 End stage renal disease: Secondary | ICD-10-CM

## 2023-05-13 ENCOUNTER — Telehealth (HOSPITAL_COMMUNITY): Payer: Self-pay

## 2023-05-13 NOTE — Telephone Encounter (Signed)
Called and spoke to patient and she will send in a cardiomems transmission today.

## 2023-05-19 ENCOUNTER — Ambulatory Visit (INDEPENDENT_AMBULATORY_CARE_PROVIDER_SITE_OTHER)
Admission: RE | Admit: 2023-05-19 | Discharge: 2023-05-19 | Disposition: A | Discharge status: Home / Self Care | Payer: PPO | Source: Ambulatory Visit | Attending: Surgery | Admitting: Surgery

## 2023-05-19 ENCOUNTER — Ambulatory Visit (HOSPITAL_COMMUNITY)
Admission: RE | Admit: 2023-05-19 | Discharge: 2023-05-19 | Disposition: A | Discharge status: Home / Self Care | Payer: PPO | Source: Ambulatory Visit | Attending: Surgery | Admitting: Surgery

## 2023-05-19 DIAGNOSIS — N186 End stage renal disease: Secondary | ICD-10-CM | POA: Diagnosis not present

## 2023-05-27 ENCOUNTER — Ambulatory Visit: Payer: PPO | Admitting: Vascular Surgery

## 2023-05-27 ENCOUNTER — Encounter: Payer: Self-pay | Admitting: Vascular Surgery

## 2023-05-27 ENCOUNTER — Telehealth (HOSPITAL_COMMUNITY): Payer: Self-pay | Admitting: Adult Health

## 2023-05-27 VITALS — BP 137/65 | HR 61 | Temp 97.6°F | Wt 168.0 lb

## 2023-05-27 DIAGNOSIS — Z992 Dependence on renal dialysis: Secondary | ICD-10-CM | POA: Diagnosis not present

## 2023-05-27 DIAGNOSIS — N186 End stage renal disease: Secondary | ICD-10-CM

## 2023-05-27 NOTE — Telephone Encounter (Signed)
Called and asked Mrs Botkin to send cardiomems reading.   Mrs Huey appreciated the call.    Marelly Wehrman NP-C  3:42 PM

## 2023-05-27 NOTE — Progress Notes (Signed)
[facility] [place]  ASSESSMENT / PLAN: Ginnette Berardino is a 67 y.o. right handed female in need of permanent dialysis access. I reviewed options for dialysis in detail with the patient, including hemodialysis and peritoneal dialysis. I counseled the patient to ask their nephrologist about their candidacy for renal transplant. I counseled the patient that dialysis access requires surveillance and periodic maintenance. Plan to proceed with left arm arteriovenous graft as her dialysis schedule allows.   CHIEF COMPLAINT: Kidney failure  HISTORY OF PRESENT ILLNESS: Amos Diane Meagan Reddy is a 67 y.o. female referred to clinic for evaluation of permanent dialysis access.  Patient has been diagnosed with end-stage renal disease and is currently dialyzing Monday, Wednesday, and Friday through a left IJ tunneled dialysis catheter.  She is right-handed.  She has never had any prior dialysis access surgery.  She reports no history of pacemaker, port, defibrillator, etc. in the neck or chest.  We reviewed her vein mapping and my recommendation for an arteriovenous graft.  Past Medical History:  Diagnosis Date   Acute hypoxemic respiratory failure due to COVID-19 (HCC) 12/30/2019   Anemia    Arthritis    Atrial fibrillation (HCC)    Chronic kidney disease    Chronic respiratory failure with hypoxia, on home O2 therapy (HCC)    CKD (chronic kidney disease), stage III (HCC)    COVID-19    Diabetes mellitus without complication (HCC)    Diastolic CHF (HCC)    GI bleed    GI bleed    Gout    Hypertension    Thrombocytopenia (HCC)     Past Surgical History:  Procedure Laterality Date   HERNIA REPAIR  02/2019   IR THORACENTESIS ASP PLEURAL SPACE W/IMG GUIDE  06/28/2020   IR THORACENTESIS ASP PLEURAL SPACE W/IMG GUIDE  08/31/2020   IR THORACENTESIS ASP PLEURAL SPACE W/IMG GUIDE  06/27/2022   PRESSURE SENSOR/CARDIOMEMS N/A 12/26/2020   Procedure: PRESSURE  SENSOR/CARDIOMEMS;  Surgeon: Laurey Morale, MD;  Location: [facility] INVASIVE CV LAB;  Service: Cardiovascular;  Laterality: N/A;   RIGHT HEART CATH N/A 09/07/2020   Procedure: RIGHT HEART CATH;  Surgeon: Laurey Morale, MD;  Location: [facility] INVASIVE CV LAB;  Service: Cardiovascular;  Laterality: N/A;   RIGHT HEART CATH N/A 07/02/2022   Procedure: RIGHT HEART CATH;  Surgeon: Laurey Morale, MD;  Location: [facility] INVASIVE CV LAB;  Service: Cardiovascular;  Laterality: N/A;    Family History  Problem Relation Age of Onset   Cancer Mother     Social History   Socioeconomic History   Marital status: Married    Spouse name: Not on file   Number of children: Not on file   Years of education: Not on file   Highest education level: Not on file  Occupational History   Not on file  Tobacco Use   Smoking status: Never   Smokeless tobacco: Never  Vaping Use   Vaping Use: Never used  Substance and Sexual Activity   Alcohol use: Never   Drug use: Never   Sexual activity: Not on file  Other Topics Concern   Not on file  Social History Narrative   Not on file   Social Determinants of Health   Financial Resource Strain: Not on file  Food Insecurity: Not on file  Transportation Needs: No Transportation Needs (07/25/2020)   PRAPARE - Administrator, Civil Service (Medical): No    Lack of Transportation (Non-Medical): No  Physical Activity: Not on file  Stress: Not on file  Social Connections: Socially Integrated (07/25/2020)   Social Connection and Isolation Panel [NHANES]    Frequency of Communication with Friends and Family: More than three times a week    Frequency of Social Gatherings with Friends and Family: Once a week    Attends Religious Services: 1 to 4 times per year    Active Member of [employer] or Organizations: No    Attends Engineer, structural: 1 to 4 times per year    Marital Status: Married  Catering manager Violence: Not on file    Allergies  Allergen  Reactions   Codeine Other (See Comments)    Mouth sores    Prednisone Other (See Comments)    Delusions    Moxifloxacin Rash    Current Outpatient Medications  Medication Sig Dispense Refill   acetaminophen (TYLENOL) 325 MG tablet Take 650 mg by mouth every 6 (six) hours as needed for mild pain or headache.     allopurinol (ZYLOPRIM) 100 MG tablet Take 100 mg by mouth 2 (two) times daily.     aspirin EC 81 MG tablet Take 81 mg by mouth daily. Swallow whole.     blood glucose meter kit and supplies Dispense based on patient and insurance preference. Use up to four times daily as directed. (FOR ICD-10 E10.9, E11.9). 1 each 0   Blood Glucose Monitoring Suppl (FIFTY50 GLUCOSE METER 2.0) w/Device KIT See admin instructions.     carvedilol (COREG) 6.25 MG tablet TAKE 1 TABLET(6.25 MG) BY MOUTH TWICE DAILY 180 tablet 1   ferrous sulfate 324 MG TBEC Take 324 mg by mouth 3 (three) times a week.     Menthol-Methyl Salicylate (MUSCLE RUB) 10-15 % CREA Apply 1 application topically as needed for muscle pain (knees).      metolazone (ZAROXOLYN) 2.5 MG tablet Take 1 tablet (2.5 mg total) by mouth once a week. tuesday 5 tablet 3   Multiple Vitamin (MULTIVITAMIN WITH MINERALS) TABS tablet Take 2 tablets by mouth daily. Alive gummy bears     OXYGEN Inhale 2 L/min into the lungs at bedtime. prn     pantoprazole (PROTONIX) 40 MG tablet Take 40 mg by mouth daily.     polyethylene glycol (MIRALAX / GLYCOLAX) 17 g packet Take 17 g by mouth daily.     torsemide (DEMADEX) 100 MG tablet Take 1 tablet (100 mg total) by mouth 2 (two) times daily. 60 tablet 0   No current facility-administered medications for this visit.    PHYSICAL EXAM Vitals:   05/27/23 0831  BP: 137/65  Pulse: 61  Temp: 97.6 F (36.4 C)  TempSrc: Temporal  SpO2: 100%  Weight: 168 lb (76.2 kg)    Elderly woman in no acute distress Regular rate and rhythm Unlabored breathing 2+ radial pulses bilaterally   PERTINENT LABORATORY AND  RADIOLOGIC DATA  Most recent CBC    Latest Ref Rng & Units 08/28/2022    9:51 AM 07/04/2022    2:12 AM 07/03/2022    1:27 AM  CBC  WBC 4.0 - 10.5 K/uL 4.2  5.0  4.4   Hemoglobin 12.0 - 15.0 g/dL 8.5  8.7  8.8   Hematocrit 36.0 - 46.0 % 25.0  26.8  27.1   Platelets 150 - 400 K/uL 95  148  143      Most recent CMP    Latest Ref Rng & Units 01/08/2023    3:03 PM 12/23/2022  9:23 AM 12/11/2022   10:12 AM  CMP  Glucose 70 - 99 mg/dL 409  811  914   BUN 8 - 23 mg/dL 85  76  71   Creatinine 0.44 - 1.00 mg/dL 7.82  9.56  2.13   Sodium 135 - 145 mmol/L 137  137  143   Potassium 3.5 - 5.1 mmol/L 4.0  3.3  4.0   Chloride 98 - 111 mmol/L 95  95  96   CO2 22 - 32 mmol/L 32  31  29   Calcium 8.9 - 10.3 mg/dL 9.2  9.2  9.6     Renal function CrCl cannot be calculated (Patient's most recent lab result is older than the maximum 21 days allowed.).  Hgb A1c MFr Bld (%)  Date Value  06/27/2022 7.0 (H)    LDL Cholesterol  Date Value Ref Range Status  07/20/2020 65 0 - 99 mg/dL Final    Comment:           Total Cholesterol/HDL:CHD Risk Coronary Heart Disease Risk Table                     Men   Women  1/2 Average Risk   3.4   3.3  Average Risk       5.0   4.4  2 X Average Risk   9.6   7.1  3 X Average Risk  23.4   11.0        Use the calculated Patient Ratio above and the CHD Risk Table to determine the patient's CHD Risk.        ATP III CLASSIFICATION (LDL):  <100     mg/dL   Optimal  086-578  mg/dL   Near or Above                    Optimal  130-159  mg/dL   Borderline  469-629  mg/dL   High  >528     mg/dL   Very High Performed at [facility], [address] [place], [place] [address]     Vein mapping shows no adequate autogenous vein for dialysis access   [employer]. Lenell Antu, MD FACS [facility] Phone Number: [phone] 05/27/2023 11:52 AM   Total time spent on preparing this encounter including chart review, data  review, collecting history, examining the patient, coordinating care for this new patient, 60 minutes.  Portions of this report may have been transcribed using voice recognition software.  Every effort has been made to ensure accuracy; however, inadvertent computerized transcription errors may still be present.

## 2023-05-29 ENCOUNTER — Ambulatory Visit: Admit: 2023-05-29 | Payer: PPO | Admitting: Vascular Surgery

## 2023-05-29 SURGERY — INSERTION OF ARTERIOVENOUS (AV) GORE-TEX GRAFT ARM
Anesthesia: Monitor Anesthesia Care | Laterality: Left

## 2023-06-03 ENCOUNTER — Telehealth (HOSPITAL_COMMUNITY): Payer: Self-pay

## 2023-06-03 NOTE — Telephone Encounter (Signed)
Surgical Clearance form faxed on [facility] on 06/03/23

## 2023-06-05 ENCOUNTER — Other Ambulatory Visit: Payer: Self-pay

## 2023-06-05 DIAGNOSIS — N186 End stage renal disease: Secondary | ICD-10-CM

## 2023-06-16 NOTE — Addendum Note (Signed)
Addended by: Primitivo Gauze on: 06/16/2023 03:07 PM   Modules accepted: Orders

## 2023-06-17 ENCOUNTER — Encounter (HOSPITAL_COMMUNITY): Payer: Self-pay | Admitting: Vascular Surgery

## 2023-06-17 ENCOUNTER — Other Ambulatory Visit: Payer: Self-pay

## 2023-06-17 NOTE — Progress Notes (Signed)
PCP - Dr Feliciana Rossetti Cardiologist - Dr Marca Ancona  Chest x-ray - 01/09/23 CE EKG - 03/21/23 Stress Test - 05/18/20 (under cardiology note) ECHO - 04/08/23 Cardiac Cath - 07/02/22  ICD Pacemaker/Loop - n/a  Sleep Study -  Yes (02/13/22) CPAP - does not use CPAP, uses oxygen 2L via [place]   Diabetes Type 2 - no meds, diet controlled.  Patient does not check her CBGs.  NPO  Anesthesia review: Yes  STOP now taking any Aspirin (unless otherwise instructed by your surgeon), Aleve, Naproxen, Ibuprofen, Motrin, Advil, Goody's, BC's, all herbal medications, fish oil, and all vitamins.   Coronavirus Screening Do you have any of the following symptoms:  Cough yes/no: No Fever (>100.65F)  yes/no: No Runny nose yes/no: No Sore throat yes/no: No Difficulty breathing/shortness of breath  yes/no: No  Have you traveled in the last 14 days and where? yes/no: No  Patient verbalized understanding of instructions that were given via phone.

## 2023-06-18 NOTE — Anesthesia Preprocedure Evaluation (Signed)
Anesthesia Evaluation  Patient identified by MRN, date of birth, ID band Patient awake    Reviewed: Allergy & Precautions, NPO status , Patient's Chart, lab work & pertinent test results  Airway Mallampati: III  TM Distance: <3 FB Neck ROM: Full  Mouth opening: Limited Mouth Opening  Dental  (+) Missing   Pulmonary shortness of breath and Long-Term Oxygen Therapy, sleep apnea and Oxygen sleep apnea    breath sounds clear to auscultation       Cardiovascular hypertension, Pt. on medications +CHF   Rhythm:Regular Rate:Normal     Neuro/Psych negative neurological ROS     GI/Hepatic negative GI ROS, Neg liver ROS,,,  Endo/Other  diabetes, Type 2    Renal/GU ESRFRenal disease     Musculoskeletal  (+) Arthritis ,    Abdominal   Peds  Hematology  (+) Blood dyscrasia, anemia   Anesthesia Other Findings   Reproductive/Obstetrics                             Anesthesia Physical Anesthesia Plan  ASA: 4  Anesthesia Plan: Regional and MAC   Post-op Pain Management: Regional block*   Induction:   PONV Risk Score and Plan: 2 and Propofol infusion and Ondansetron  Airway Management Planned: Natural Airway and Simple Face Mask  Additional Equipment:   Intra-op Plan:   Post-operative Plan:   Informed Consent: I have reviewed the patients History and Physical, chart, labs and discussed the procedure including the risks, benefits and alternatives for the proposed anesthesia with the patient or authorized representative who has indicated his/her understanding and acceptance.       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  Most recent echo 04/08/2023 showed EF 40 to 45%, low normal RV function, RVSP 64 mmHg, mild to moderate tricuspid regurgitation, mild mitral regurgitation.  Last seen by Dr. Shirlee Latch 03/21/2023.  Per note, her breathing was noted to be better now that she is on HD; able to walk with cane or walker without dyspnea on flat ground.  Per clearance scanned under media tab 06/16/2023, Dr. Shirlee Latch cleared patient to proceed with surgery at moderate risk from cardiac standpoint.  She is still making some urine per her report and takes torsemide 100 mg bid.  History of left-sided breast cancer 30 years ago treated with surgery and chemoradiation.  IDDM2, A1c 7.4 on 01/09/2023.  History of chronic thrombocytopenia.  Will need DOS labs and evaluation.  EKG 03/21/2023: Atrial fibrillation.  Rate 72.  Right axis deviation.  Nonspecific intraventricular conduction delay.  TTE 04/08/2023: 1. Left ventricular ejection fraction, by estimation, is 40 to 45%. Left  ventricular ejection fraction by 3D volume is 45 %. The left ventricle has  mildly decreased function. The left ventricle demonstrates global  hypokinesis. Left ventricular diastolic  function could not be evaluated.  2. Right ventricular systolic function is low normal. The right  ventricular size is mildly enlarged. There is severely elevated pulmonary  artery systolic pressure. The estimated right ventricular systolic  pressure is 64.0 mmHg.  3. Left atrial size was mildly  dilated.  4. Right atrial size was moderately dilated.  5. The mitral valve is abnormal. Mild mitral valve regurgitation.  6. Tricuspid valve regurgitation is mild to moderate.  7. The aortic valve is tricuspid. Aortic valve regurgitation is not  visualized.  8. The inferior vena cava is dilated in size with <50%  respiratory  variability, suggesting right atrial pressure of 15 mmHg.  9. Cannot exclude a small PFO.  10. Rhythm strip during this exam demonstrates atrial fibrillation.   Comparison(s): Changes from prior study are noted. 12/28/2021: LVEF 55-60%.     )        Anesthesia Quick Evaluation

## 2023-06-18 NOTE — Progress Notes (Signed)
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  Most recent echo 04/08/2023 showed EF 40 to 45%, low normal RV function, RVSP 64 mmHg, mild to moderate tricuspid regurgitation, mild mitral regurgitation.  Last seen by Dr. Shirlee Latch 03/21/2023.  Per note, her breathing was noted to be better now that she is on HD; able to walk with cane or walker without dyspnea on flat ground.  Per clearance scanned under media tab 06/16/2023, Dr. Shirlee Latch cleared patient to proceed with surgery at moderate risk from cardiac standpoint.  She is still making some urine per her report and takes torsemide 100 mg bid.  History of left-sided breast cancer 30 years ago treated with surgery and chemoradiation.  IDDM2, A1c 7.4 on 01/09/2023.  History of chronic thrombocytopenia.  Will need DOS labs and evaluation.  EKG 03/21/2023: Atrial fibrillation.  Rate 72.  Right axis deviation.  Nonspecific intraventricular conduction delay.  TTE 04/08/2023:  1. Left ventricular ejection fraction, by estimation, is 40 to 45%. Left  ventricular ejection fraction by 3D volume is 45 %. The left ventricle has  mildly decreased function. The left ventricle demonstrates global  hypokinesis. Left ventricular diastolic   function could not be evaluated.   2. Right ventricular systolic function is low normal. The right  ventricular size is mildly enlarged. There is severely elevated pulmonary  artery systolic pressure. The estimated right ventricular systolic  pressure is 64.0 mmHg.   3. Left atrial size was mildly dilated.   4. Right atrial size was moderately dilated.    5. The mitral valve is abnormal. Mild mitral valve regurgitation.   6. Tricuspid valve regurgitation is mild to moderate.   7. The aortic valve is tricuspid. Aortic valve regurgitation is not  visualized.   8. The inferior vena cava is dilated in size with <50% respiratory  variability, suggesting right atrial pressure of 15 mmHg.   9. Cannot exclude a small PFO.  10. Rhythm strip during this exam demonstrates atrial fibrillation.   Comparison(s): Changes from prior study are noted. 12/28/2021: LVEF 55-60%.     Zannie Cove [facility]/Anesthesiology Phone [phone] 06/18/2023 10:50 AM

## 2023-06-19 ENCOUNTER — Ambulatory Visit (HOSPITAL_BASED_OUTPATIENT_CLINIC_OR_DEPARTMENT_OTHER): Payer: PPO | Admitting: Physician Assistant

## 2023-06-19 ENCOUNTER — Encounter (HOSPITAL_COMMUNITY): Payer: Self-pay | Admitting: Vascular Surgery

## 2023-06-19 ENCOUNTER — Other Ambulatory Visit: Payer: Self-pay

## 2023-06-19 ENCOUNTER — Encounter (HOSPITAL_COMMUNITY)
Admission: RE | Disposition: A | Discharge status: Home / Self Care | Payer: Self-pay | Source: Home / Self Care | Attending: Vascular Surgery

## 2023-06-19 ENCOUNTER — Ambulatory Visit (HOSPITAL_COMMUNITY)
Admission: RE | Admit: 2023-06-19 | Discharge: 2023-06-19 | Disposition: A | Discharge status: Home / Self Care | Payer: PPO | Attending: Vascular Surgery | Admitting: Vascular Surgery

## 2023-06-19 ENCOUNTER — Ambulatory Visit (HOSPITAL_COMMUNITY): Payer: PPO | Admitting: Physician Assistant

## 2023-06-19 DIAGNOSIS — N185 Chronic kidney disease, stage 5: Secondary | ICD-10-CM | POA: Diagnosis not present

## 2023-06-19 DIAGNOSIS — Z9981 Dependence on supplemental oxygen: Secondary | ICD-10-CM | POA: Diagnosis not present

## 2023-06-19 DIAGNOSIS — I130 Hypertensive heart and chronic kidney disease with heart failure and stage 1 through stage 4 chronic kidney disease, or unspecified chronic kidney disease: Secondary | ICD-10-CM | POA: Diagnosis not present

## 2023-06-19 DIAGNOSIS — G4730 Sleep apnea, unspecified: Secondary | ICD-10-CM | POA: Diagnosis not present

## 2023-06-19 DIAGNOSIS — E1122 Type 2 diabetes mellitus with diabetic chronic kidney disease: Secondary | ICD-10-CM | POA: Insufficient documentation

## 2023-06-19 DIAGNOSIS — Z992 Dependence on renal dialysis: Secondary | ICD-10-CM | POA: Diagnosis not present

## 2023-06-19 DIAGNOSIS — N186 End stage renal disease: Secondary | ICD-10-CM | POA: Diagnosis not present

## 2023-06-19 DIAGNOSIS — I132 Hypertensive heart and chronic kidney disease with heart failure and with stage 5 chronic kidney disease, or end stage renal disease: Secondary | ICD-10-CM | POA: Diagnosis not present

## 2023-06-19 DIAGNOSIS — I4891 Unspecified atrial fibrillation: Secondary | ICD-10-CM | POA: Diagnosis not present

## 2023-06-19 DIAGNOSIS — I5032 Chronic diastolic (congestive) heart failure: Secondary | ICD-10-CM | POA: Diagnosis not present

## 2023-06-19 DIAGNOSIS — I5033 Acute on chronic diastolic (congestive) heart failure: Secondary | ICD-10-CM

## 2023-06-19 DIAGNOSIS — Z7682 Awaiting organ transplant status: Secondary | ICD-10-CM | POA: Insufficient documentation

## 2023-06-19 HISTORY — DX: Dependence on other enabling machines and devices: Z99.89

## 2023-06-19 HISTORY — DX: Dyspnea, unspecified: R06.00

## 2023-06-19 HISTORY — PX: AV FISTULA PLACEMENT: SHX1204

## 2023-06-19 HISTORY — DX: Dependence on supplemental oxygen: Z99.81

## 2023-06-19 HISTORY — DX: Malignant (primary) neoplasm, unspecified: C80.1

## 2023-06-19 HISTORY — DX: Sleep apnea, unspecified: G47.30

## 2023-06-19 HISTORY — DX: Pneumonia, unspecified organism: J18.9

## 2023-06-19 LAB — POCT I-STAT, CHEM 8
BUN: 37 mg/dL — ABNORMAL HIGH (ref 8–23)
Calcium, Ion: 1.09 mmol/L — ABNORMAL LOW (ref 1.15–1.40)
Chloride: 99 mmol/L (ref 98–111)
Creatinine, Ser: 4.9 mg/dL — ABNORMAL HIGH (ref 0.44–1.00)
Glucose, Bld: 91 mg/dL (ref 70–99)
HCT: 41 % (ref 36.0–46.0)
Hemoglobin: 13.9 g/dL (ref 12.0–15.0)
Potassium: 3.9 mmol/L (ref 3.5–5.1)
Sodium: 138 mmol/L (ref 135–145)
TCO2: 29 mmol/L (ref 22–32)

## 2023-06-19 LAB — GLUCOSE, CAPILLARY
Glucose-Capillary: 91 mg/dL (ref 70–99)
Glucose-Capillary: 91 mg/dL (ref 70–99)
Glucose-Capillary: 84 mg/dL (ref 70–99)
Glucose-Capillary: 84 mg/dL (ref 70–99)

## 2023-06-19 SURGERY — INSERTION OF ARTERIOVENOUS (AV) GORE-TEX GRAFT ARM
Anesthesia: Monitor Anesthesia Care | Site: Arm Upper | Laterality: Left

## 2023-06-19 MED ORDER — PHENYLEPHRINE HCL-NACL 20-0.9 MG/250ML-% IV SOLN
INTRAVENOUS | Status: DC | PRN
  Administered 2023-06-19: 20 ug/min via INTRAVENOUS

## 2023-06-19 MED ORDER — FENTANYL CITRATE (PF) 100 MCG/2ML IJ SOLN: 25.0000 ug | INTRAMUSCULAR | Status: DC | PRN

## 2023-06-19 MED ORDER — PROPOFOL 500 MG/50ML IV EMUL
INTRAVENOUS | Status: DC | PRN
  Administered 2023-06-19: 55 ug/kg/min via INTRAVENOUS

## 2023-06-19 MED ORDER — PROPOFOL 10 MG/ML IV BOLUS
INTRAVENOUS | Status: DC | PRN
  Administered 2023-06-19: 20 mg via INTRAVENOUS
  Administered 2023-06-19: 10 mg via INTRAVENOUS

## 2023-06-19 MED ORDER — 0.9 % SODIUM CHLORIDE (POUR BTL) OPTIME
TOPICAL | Status: DC | PRN
  Administered 2023-06-19: 1000 mL

## 2023-06-19 MED ORDER — CHLORHEXIDINE GLUCONATE 0.12 % MT SOLN
OROMUCOSAL | Status: AC
  Administered 2023-06-19: 15 mL via OROMUCOSAL
  Filled 2023-06-19: qty 15

## 2023-06-19 MED ORDER — SODIUM CHLORIDE 0.9 % IV SOLN: INTRAVENOUS | Status: DC

## 2023-06-19 MED ORDER — CHLORHEXIDINE GLUCONATE 4 % EX SOLN: 60.0000 mL | Freq: Once | CUTANEOUS | Status: DC

## 2023-06-19 MED ORDER — INSULIN ASPART 100 UNIT/ML IJ SOLN: 0.0000 [IU] | INTRAMUSCULAR | Status: DC | PRN

## 2023-06-19 MED ORDER — HYDROCODONE-ACETAMINOPHEN 5-325 MG PO TABS: 1.0000 | ORAL_TABLET | Freq: Four times a day (QID) | ORAL | 0 refills | Status: DC | PRN

## 2023-06-19 MED ORDER — FENTANYL CITRATE (PF) 100 MCG/2ML IJ SOLN
INTRAMUSCULAR | Status: AC
  Administered 2023-06-19: 50 ug via INTRAVENOUS
  Filled 2023-06-19: qty 2

## 2023-06-19 MED ORDER — CEFAZOLIN SODIUM-DEXTROSE 2-4 GM/100ML-% IV SOLN
2.0000 g | INTRAVENOUS | Status: AC
  Administered 2023-06-19: 2 g via INTRAVENOUS

## 2023-06-19 MED ORDER — HEPARIN 6000 UNIT IRRIGATION SOLUTION
Status: DC | PRN
  Administered 2023-06-19: 1

## 2023-06-19 MED ORDER — ORAL CARE MOUTH RINSE: 15.0000 mL | Freq: Once | OROMUCOSAL | Status: AC

## 2023-06-19 MED ORDER — LIDOCAINE 2% (20 MG/ML) 5 ML SYRINGE
INTRAMUSCULAR | Status: DC | PRN
  Administered 2023-06-19: 60 mg via INTRAVENOUS

## 2023-06-19 MED ORDER — MIDAZOLAM HCL 2 MG/2ML IJ SOLN
INTRAMUSCULAR | Status: AC
  Administered 2023-06-19: 0.5 mg via INTRAVENOUS
  Filled 2023-06-19: qty 2

## 2023-06-19 MED ORDER — HEPARIN SODIUM (PORCINE) 1000 UNIT/ML IJ SOLN
INTRAMUSCULAR | Status: DC | PRN
  Administered 2023-06-19: 3000 [IU] via INTRAVENOUS

## 2023-06-19 MED ORDER — MIDAZOLAM HCL 2 MG/2ML IJ SOLN: 0.5000 mg | Freq: Once | INTRAMUSCULAR | Status: AC

## 2023-06-19 MED ORDER — HEPARIN 6000 UNIT IRRIGATION SOLUTION
Status: AC
  Filled 2023-06-19: qty 500

## 2023-06-19 MED ORDER — LIDOCAINE HCL (PF) 1 % IJ SOLN
INTRAMUSCULAR | Status: AC
  Filled 2023-06-19: qty 30

## 2023-06-19 MED ORDER — CHLORHEXIDINE GLUCONATE 0.12 % MT SOLN: 15.0000 mL | Freq: Once | OROMUCOSAL | Status: AC

## 2023-06-19 MED ORDER — FENTANYL CITRATE (PF) 100 MCG/2ML IJ SOLN: 50.0000 ug | Freq: Once | INTRAMUSCULAR | Status: AC

## 2023-06-19 MED ORDER — PHENYLEPHRINE 80 MCG/ML (10ML) SYRINGE FOR IV PUSH (FOR BLOOD PRESSURE SUPPORT)
PREFILLED_SYRINGE | INTRAVENOUS | Status: DC | PRN
  Administered 2023-06-19 (×3): 160 ug via INTRAVENOUS

## 2023-06-19 MED ORDER — ROPIVACAINE HCL 5 MG/ML IJ SOLN
INTRAMUSCULAR | Status: DC | PRN
  Administered 2023-06-19: 30 mL via PERINEURAL

## 2023-06-19 MED ORDER — CEFAZOLIN SODIUM-DEXTROSE 2-4 GM/100ML-% IV SOLN
INTRAVENOUS | Status: AC
  Filled 2023-06-19: qty 100

## 2023-06-19 MED ORDER — HEMOSTATIC AGENTS (NO CHARGE) OPTIME
TOPICAL | Status: DC | PRN
  Administered 2023-06-19: 1 via TOPICAL

## 2023-06-19 SURGICAL SUPPLY — 41 items
ADH SKN CLS APL DERMABOND .7 (GAUZE/BANDAGES/DRESSINGS) ×1
ARMBAND PINK RESTRICT EXTREMIT (MISCELLANEOUS) ×1 IMPLANT
BAG COUNTER SPONGE SURGICOUNT (BAG) ×1 IMPLANT
BAG SPNG CNTER NS LX DISP (BAG) ×1
BLADE CLIPPER SURG (BLADE) ×1 IMPLANT
BNDG ELASTIC 4X5.8 VLCR STR LF (GAUZE/BANDAGES/DRESSINGS) ×1 IMPLANT
CANISTER SUCT 3000ML PPV (MISCELLANEOUS) ×1 IMPLANT
CLIP TI MEDIUM 24 (CLIP) ×1 IMPLANT
CLIP TI MEDIUM 6 (CLIP) ×2 IMPLANT
CLIP TI WIDE RED SMALL 24 (CLIP) ×1 IMPLANT
CLIP TI WIDE RED SMALL 6 (CLIP) ×1 IMPLANT
COVER PROBE W GEL 5X96 (DRAPES) ×1 IMPLANT
DERMABOND ADVANCED .7 DNX12 (GAUZE/BANDAGES/DRESSINGS) ×1 IMPLANT
ELECT REM PT RETURN 9FT ADLT (ELECTROSURGICAL) ×1
ELECTRODE REM PT RTRN 9FT ADLT (ELECTROSURGICAL) ×1 IMPLANT
GLOVE BIOGEL PI IND STRL 8 (GLOVE) ×1 IMPLANT
GOWN STRL REUS W/ TWL LRG LVL3 (GOWN DISPOSABLE) ×2 IMPLANT
GOWN STRL REUS W/TWL 2XL LVL3 (GOWN DISPOSABLE) ×2 IMPLANT
GOWN STRL REUS W/TWL LRG LVL3 (GOWN DISPOSABLE) ×2
KIT BASIN OR (CUSTOM PROCEDURE TRAY) ×1 IMPLANT
KIT TURNOVER KIT B (KITS) ×1 IMPLANT
LOOP VASCULAR MINI 18 RED (MISCELLANEOUS) ×1
NS IRRIG 1000ML POUR BTL (IV SOLUTION) ×1 IMPLANT
PACK CV ACCESS (CUSTOM PROCEDURE TRAY) ×1 IMPLANT
PAD ARMBOARD 7.5X6 YLW CONV (MISCELLANEOUS) ×2 IMPLANT
POWDER SURGICEL 3.0 GRAM (HEMOSTASIS) IMPLANT
SLING ARM FOAM STRAP LRG (SOFTGOODS) IMPLANT
SLING ARM FOAM STRAP MED (SOFTGOODS) IMPLANT
SPIKE FLUID TRANSFER (MISCELLANEOUS) ×1 IMPLANT
SUT GORETEX 6.0 TT13 (SUTURE) IMPLANT
SUT MNCRL AB 4-0 PS2 18 (SUTURE) ×1 IMPLANT
SUT PROLENE 6 0 BV (SUTURE) ×1 IMPLANT
SUT PROLENE 7 0 BV 1 (SUTURE) IMPLANT
SUT SILK 2 0 PERMA HAND 18 BK (SUTURE) IMPLANT
SUT SILK 3 0 SH CR/8 (SUTURE) ×1 IMPLANT
SUT VIC AB 3-0 SH 27 (SUTURE) ×2
SUT VIC AB 3-0 SH 27X BRD (SUTURE) ×2 IMPLANT
TOWEL GREEN STERILE (TOWEL DISPOSABLE) ×1 IMPLANT
UNDERPAD 30X36 HEAVY ABSORB (UNDERPADS AND DIAPERS) ×1 IMPLANT
VASCULAR TIE MINI RED 18IN STL (MISCELLANEOUS) IMPLANT
WATER STERILE IRR 1000ML POUR (IV SOLUTION) ×1 IMPLANT

## 2023-06-19 NOTE — Discharge Instructions (Signed)
° °  [facility] ° °Discharge Instructions ° °AV Fistula or Graft Surgery for Dialysis Access ° °Please refer to the following instructions for your post-procedure care. Your surgeon or physician assistant will discuss any changes with you. ° °Activity ° °You may drive the day following your surgery, if you are comfortable and no longer taking prescription pain medication. Resume full activity as the soreness in your incision resolves. ° °Bathing/Showering ° °You may shower after you go home. Keep your incision dry for 48 hours. Do not soak in a bathtub, hot tub, or swim until the incision heals completely. You may not shower if you have a hemodialysis catheter. ° °Incision Care ° °Clean your incision with mild soap and water after 48 hours. Pat the area dry with a clean towel. You do not need a bandage unless otherwise instructed. Do not apply any ointments or creams to your incision. You may have skin glue on your incision. Do not peel it off. It will come off on its own in about one week. Your arm may swell a bit after surgery. To reduce swelling use pillows to elevate your arm so it is above your heart. Your doctor will tell you if you need to lightly wrap your arm with an ACE bandage. ° °Diet ° °Resume your normal diet. There are not special food restrictions following this procedure. In order to heal from your surgery, it is CRITICAL to get adequate nutrition. Your body requires vitamins, minerals, and protein. Vegetables are the best source of vitamins and minerals. Vegetables also provide the perfect balance of protein. Processed food has little nutritional value, so try to avoid this. ° °Medications ° °Resume taking all of your medications. If your incision is causing pain, you may take over-the counter pain relievers such as acetaminophen (Tylenol). If you were prescribed a stronger pain medication, please be aware these medications can cause nausea and constipation. Prevent  nausea by taking the medication with a snack or meal. Avoid constipation by drinking plenty of fluids and eating foods with high amount of fiber, such as fruits, vegetables, and grains. Do not take Tylenol if you are taking prescription pain medications. ° ° ° ° °Follow up °Your surgeon may want to see you in the office following your access surgery. If so, this will be arranged at the time of your surgery. ° °Please call us immediately for any of the following conditions: ° °Increased pain, redness, drainage (pus) from your incision site °Fever of 101 degrees or higher °Severe or worsening pain at your incision site °Hand pain or numbness. ° °Reduce your risk of vascular disease: ° °Stop smoking. If you would like help, call [facility] at 1-800-QUIT-NOW (1-800-784-8669) or [facility] at [phone] ° °Manage your cholesterol °Maintain a desired weight °Control your diabetes °Keep your blood pressure down ° °Dialysis ° °It will take several weeks to several months for your new dialysis access to be ready for use. Your surgeon will determine when it is OK to use it. Your nephrologist will continue to direct your dialysis. You can continue to use your Permcath until your new access is ready for use. ° °If you have any questions, please call the office at [phone]. ° °

## 2023-06-19 NOTE — Anesthesia Procedure Notes (Signed)
Anesthesia Regional Block: Axillary brachial plexus block   Pre-Anesthetic Checklist: , timeout performed,  Correct Patient, Correct Site, Correct Laterality,  Correct Procedure, Correct Position, site marked,  Risks and benefits discussed,  Surgical consent,  Pre-op evaluation,  At surgeon's request and post-op pain management  Laterality: Left  Prep: chloraprep       Needles:  Injection technique: Single-shot  Needle Type: Echogenic Stimulator Needle     Needle Length: 9cm  Needle Gauge: 21     Additional Needles:   Procedures:, nerve stimulator,,, ultrasound used (permanent image in chart),,     Nerve Stimulator or Paresthesia:  Response: MC, Median, Ulnar and radial responses elicited, 0.5 mA  Additional Responses:   Narrative:  Start time: 06/19/2023 3:27 PM End time: 06/19/2023 3:35 PM Injection made incrementally with aspirations every 5 mL.  Performed by: Personally  Anesthesiologist: Marcene Duos, MD

## 2023-06-19 NOTE — Op Note (Signed)
OPERATIVE NOTE  DATE: June 19, 2023  PROCEDURE: left brachiocephalic arteriovenous fistula placement  PRE-OPERATIVE DIAGNOSIS: end stage renal disease  POST-OPERATIVE DIAGNOSIS: same as above   SURGEON: Cephus Shelling, MD  ASSISTANT(S): Aggie Moats, PA  ANESTHESIA: regional  ESTIMATED BLOOD LOSS: Minimal  FINDING(S): 1.  Cephalic vein: 4 mm, acceptable 2.  Brachial artery: 4.5 mm, atherosclerotic disease evident 3.  Venous outflow: palpable thrill  4.  Radial flow: palpable radial pulse  SPECIMEN(S):  none  INDICATIONS:   Rita [name] is a 67 y.o. female who presents with end stage renal disease and need for permanent hemodialysis access.  The patient is scheduled for left arm arteriovenous fistula versus graft placement.  The patient is aware the risks include but are not limited to: bleeding, infection, steal syndrome, nerve damage, ischemic monomelic neuropathy, failure to mature, and need for additional procedures.  The patient is aware of the risks of the procedure and elects to proceed forward.  An assistant was needed given the complexity of the case as well as for sewing the anastomosis.   DESCRIPTION: After full informed written consent was obtained from the patient, the patient was brought back to the operating room and placed supine upon the operating table.  Prior to induction, the patient received IV antibiotics.   After obtaining adequate anesthesia, the patient was then prepped and draped in the standard fashion for a left arm access procedure.  I turned my attention first to identifying the patient's cephalic vein and brachial artery.  Using SonoSite guidance, the location of these vessels were marked out on the skin.   The cephalic vein was of usable caliber at the antecubital fossa.   I made a transverse incision at the level of the antecubitum and dissected through the subcutaneous tissue and fascia to gain exposure of the brachial  artery.  This was noted to be 4.5 mm in diameter externally.  This was dissected out proximally and distally and controlled with vessel loops .  I then dissected out the cephalic vein.  This was noted to be 4 mm in diameter externally.  The distal segment of the vein was ligated with a  2-0 silk, and the vein was transected.  The proximal segment was interrogated with serial dilators.  The vein accepted up to a 5 mm dilator without any difficulty.  I then instilled the heparinized saline into the vein and clamped it.  At this point, I reset my exposure of the brachial artery.  The patient was given 3,000 units IV heparin.  I then placed the artery under tension proximally and distally.  I made an arteriotomy with a #11 blade, and then I extended the arteriotomy with a Potts scissor.  I injected heparinized saline proximal and distal to this arteriotomy.  The vein was then sewn to the artery in an end-to-side configuration with a running stitch of 6-0 Prolene with the help of my assistant.  Prior to completing this anastomosis, I allowed the vein and artery to backbleed.  There was no evidence of clot from any vessels.  I completed the anastomosis in the usual fashion and then released all vessel loops and clamps.    There was a palpable thrill in the venous outflow, and there was a palpable radial pulse.  At this point, I irrigated out the surgical wound.  There was no further active bleeding.  The subcutaneous tissue was reapproximated with a running stitch of 3-0 Vicryl.  The skin  was then reapproximated with a running subcuticular stitch of 4-0 Monocryl.  The skin was then cleaned, dried, and reinforced with Dermabond.  The patient tolerated this procedure well.   COMPLICATIONS: None  CONDITION: Stable  Cephus Shelling, MD [facility] Office: [phone]  Cephus Shelling   06/19/2023, 6:40 PM

## 2023-06-19 NOTE — Anesthesia Procedure Notes (Signed)
Procedure Name: MAC Date/Time: 06/19/2023 5:39 PM  Performed by: Aundria Rud, CRNAPre-anesthesia Checklist: Patient identified, Emergency Drugs available, Suction available and Patient being monitored Patient Re-evaluated:Patient Re-evaluated prior to induction Oxygen Delivery Method: Simple face mask Preoxygenation: Pre-oxygenation with 100% oxygen Induction Type: IV induction Placement Confirmation: positive ETCO2 and CO2 detector Dental Injury: Teeth and Oropharynx as per pre-operative assessment

## 2023-06-19 NOTE — H&P (Signed)
[facility] [place]  Patient seen and examined in preop holding.  No complaints. No changes to medication history or physical exam since last seen in clinic. After discussing the risks and benefits of lft arm fistula v graft, Rita [name] elected to proceed.   Victorino Sparrow MD    ASSESSMENT / PLAN: Rita [name] is a 67 y.o. right handed female in need of permanent dialysis access. I reviewed options for dialysis in detail with the patient, including hemodialysis and peritoneal dialysis. I counseled the patient to ask their nephrologist about their candidacy for renal transplant. I counseled the patient that dialysis access requires surveillance and periodic maintenance. Plan to proceed with left arm arteriovenous graft as her dialysis schedule allows.   CHIEF COMPLAINT: Kidney failure  HISTORY OF PRESENT ILLNESS: Rita [name] is a 67 y.o. female referred to clinic for evaluation of permanent dialysis access.  Patient has been diagnosed with end-stage renal disease and is currently dialyzing Monday, Wednesday, and Friday through a left IJ tunneled dialysis catheter.  She is right-handed.  She has never had any prior dialysis access surgery.  She reports no history of pacemaker, port, defibrillator, etc. in the neck or chest.  We reviewed her vein mapping and my recommendation for an arteriovenous graft.  Past Medical History:  Diagnosis Date   Acute hypoxemic respiratory failure due to COVID-19 (HCC) 12/30/2019   Ambulates with cane    or walker   Anemia    Hx  Blood Transfusion in 04/2020 [facility]   Arthritis    Atrial fibrillation (HCC)    Cancer (HCC)    breast Cancer 35 yrs ago as of 06/17/23, no restrictions per patient for IV/Blood draws   Chronic respiratory failure with hypoxia, on home O2 therapy (HCC)    2L via [place] prn and qhs   CKD (chronic kidney disease), stage III (HCC)    dialysis M-W-F   COVID-19 12/30/2019    resolved   Diabetes mellitus without complication (HCC)    type 2 - diet controlled, no meds.  Patient does not check her CBGs.   Diastolic CHF (HCC)    Dyspnea    on oxygen 2L qhs via [place]   GI bleed    hx   Gout    hx - no current problems as of 06/17/23   Hypertension    Oxygen dependent    qhs and prn - 2L via [place]   Pneumonia    x several many yrs ago per patient on 06/17/23   Sleep apnea    does not use CPAP, uses oxygen 2L via [place]   Thrombocytopenia (HCC)     Past Surgical History:  Procedure Laterality Date   COLONOSCOPY  02/2020   in [place]   HERNIA REPAIR  02/2019   IR THORACENTESIS ASP PLEURAL SPACE W/IMG GUIDE  06/28/2020   IR THORACENTESIS ASP PLEURAL SPACE W/IMG GUIDE  08/31/2020   IR THORACENTESIS ASP PLEURAL SPACE W/IMG GUIDE  06/27/2022   PRESSURE SENSOR/CARDIOMEMS N/A 12/26/2020   Procedure: PRESSURE SENSOR/CARDIOMEMS;  Surgeon: Laurey Morale, MD;  Location: [facility] INVASIVE CV LAB;  Service: Cardiovascular;  Laterality: N/A;   RIGHT HEART CATH N/A 09/07/2020   Procedure: RIGHT HEART CATH;  Surgeon: Laurey Morale, MD;  Location: [facility] INVASIVE CV LAB;  Service: Cardiovascular;  Laterality: N/A;   RIGHT HEART CATH N/A 07/02/2022   Procedure: RIGHT HEART CATH;  Surgeon: Laurey Morale, MD;  Location:  [facility] INVASIVE CV LAB;  Service: Cardiovascular;  Laterality: N/A;   UPPER GI ENDOSCOPY  02/2020   in [place]    Family History  Problem Relation Age of Onset   Cancer Mother     Social History   Socioeconomic History   Marital status: Married    Spouse name: Not on file   Number of children: Not on file   Years of education: Not on file   Highest education level: Not on file  Occupational History   Not on file  Tobacco Use   Smoking status: Never   Smokeless tobacco: Never  Vaping Use   Vaping status: Never Used  Substance and Sexual Activity   Alcohol use: Never   Drug use: Never   Sexual activity: Not Currently    Birth control/protection: Post-menopausal   Other Topics Concern   Not on file  Social History Narrative   Not on file   Social Determinants of Health   Financial Resource Strain: Not on file  Food Insecurity: Not on file  Transportation Needs: No Transportation Needs (07/25/2020)   PRAPARE - Administrator, Civil Service (Medical): No    Lack of Transportation (Non-Medical): No  Physical Activity: Not on file  Stress: Not on file  Social Connections: Unknown (04/23/2022)   Received from [facility]   Social Network    Social Network: Not on file  Intimate Partner Violence: Unknown (03/15/2022)   Received from [facility]   HITS    Physically Hurt: Not on file    Insult or Talk Down To: Not on file    Threaten Physical Harm: Not on file    Scream or Curse: Not on file    Allergies  Allergen Reactions   Codeine Other (See Comments)    Mouth sores    Prednisone Other (See Comments)    Delusions    Propoxyphene Itching   Moxifloxacin Rash   Nsaids Rash    Current Facility-Administered Medications  Medication Dose Route Frequency Provider Last Rate Last Admin   0.9 %  sodium chloride infusion   Intravenous Continuous Leonie Douglas, MD       0.9 %  sodium chloride infusion   Intravenous Continuous Lewie Loron, MD 10 mL/hr at 06/19/23 1145 New Bag at 06/19/23 1145   ceFAZolin (ANCEF) 2-4 GM/100ML-% IVPB            ceFAZolin (ANCEF) IVPB 2g/100 mL premix  2 g Intravenous 30 min Pre-Op Leonie Douglas, MD       chlorhexidine (HIBICLENS) 4 % liquid 4 Application  60 mL Topical Once Leonie Douglas, MD       And   [START ON 06/20/2023] chlorhexidine (HIBICLENS) 4 % liquid 4 Application  60 mL Topical Once Leonie Douglas, MD       insulin aspart (novoLOG) injection 0-7 Units  0-7 Units Subcutaneous Q2H PRN Lewie Loron, MD        PHYSICAL EXAM Vitals:   06/17/23 1421 06/19/23 1125  BP:  (!) 137/47  Pulse:  (!) 51  Resp:  18  Temp:  98.1 F (36.7 C)  TempSrc:  Oral  SpO2:  97%  Weight:  75.8 kg 75.8 kg  Height: 5\' 1"  (1.549 m) 5\' 1"  (1.549 m)    Elderly woman in no acute distress Regular rate and rhythm Unlabored breathing 2+ radial pulses bilaterally   PERTINENT LABORATORY AND RADIOLOGIC DATA  Most recent CBC    Latest Ref Rng &  Units 06/19/2023   11:34 AM 08/28/2022    9:51 AM 07/04/2022    2:12 AM  CBC  WBC 4.0 - 10.5 K/uL  4.2  5.0   Hemoglobin 12.0 - 15.0 g/dL 16.1  8.5  8.7   Hematocrit 36.0 - 46.0 % 41.0  25.0  26.8   Platelets 150 - 400 K/uL  95  148      Most recent CMP    Latest Ref Rng & Units 06/19/2023   11:34 AM 01/08/2023    3:03 PM 12/23/2022    9:23 AM  CMP  Glucose 70 - 99 mg/dL 91  096  045   BUN 8 - 23 mg/dL 37  85  76   Creatinine 0.44 - 1.00 mg/dL 4.09  8.11  9.14   Sodium 135 - 145 mmol/L 138  137  137   Potassium 3.5 - 5.1 mmol/L 3.9  4.0  3.3   Chloride 98 - 111 mmol/L 99  95  95   CO2 22 - 32 mmol/L  32  31   Calcium 8.9 - 10.3 mg/dL  9.2  9.2     Renal function Estimated Creatinine Clearance: 10.1 mL/min (A) (by C-G formula based on SCr of 4.9 mg/dL (H)).  Hgb A1c MFr Bld (%)  Date Value  06/27/2022 7.0 (H)    LDL Cholesterol  Date Value Ref Range Status  07/20/2020 65 0 - 99 mg/dL Final    Comment:           Total Cholesterol/HDL:CHD Risk Coronary Heart Disease Risk Table                     Men   Women  1/2 Average Risk   3.4   3.3  Average Risk       5.0   4.4  2 X Average Risk   9.6   7.1  3 X Average Risk  23.4   11.0        Use the calculated Patient Ratio above and the CHD Risk Table to determine the patient's CHD Risk.        ATP III CLASSIFICATION (LDL):  <100     mg/dL   Optimal  782-956  mg/dL   Near or Above                    Optimal  130-159  mg/dL   Borderline  213-086  mg/dL   High  >578     mg/dL   Very High Performed at [facility], [address], [place], [place] [address]     Vein mapping shows no adequate autogenous vein for dialysis access   [employer]. Lenell Antu, MD  [facility] Phone Number: [phone] 06/19/2023 2:36 PM   Total time spent on preparing this encounter including chart review, data review, collecting history, examining the patient, coordinating care for this new patient, 60 minutes.  Portions of this report may have been transcribed using voice recognition software.  Every effort has been made to ensure accuracy; however, inadvertent computerized transcription errors may still be present.

## 2023-06-20 ENCOUNTER — Encounter (HOSPITAL_COMMUNITY): Payer: Self-pay | Admitting: Vascular Surgery

## 2023-06-20 NOTE — Transfer of Care (Signed)
Immediate Anesthesia Transfer of Care Note  Patient: Rita [name]  Procedure(s) Performed: LEFT ARM BRACHIOCEPHALIC ARTERIOVENOUS (AV) FISTULA CREATION (Left: Arm Upper)  Patient Location: PACU  Anesthesia Type:MAC and MAC combined with regional for post-op pain  Level of Consciousness: awake, alert , and oriented  Airway & Oxygen Therapy: Patient Spontanous Breathing  Post-op Assessment: Report given to RN and Post -op Vital signs reviewed and stable  Post vital signs: Reviewed and stable  Last Vitals:  Vitals Value Taken Time  BP 111/56 06/19/23 1915  Temp 36.5 C 06/19/23 1850  Pulse 56 06/19/23 1919  Resp 22 06/19/23 1919  SpO2 98 % 06/19/23 1919  Vitals shown include unfiled device data.  Last Pain:  Vitals:   06/19/23 1900  TempSrc:   PainSc: 0-No pain         Complications: No notable events documented.

## 2023-06-30 ENCOUNTER — Encounter (HOSPITAL_COMMUNITY): Payer: Self-pay | Admitting: Vascular Surgery

## 2023-06-30 NOTE — Addendum Note (Signed)
Addendum  created 06/30/23 0728 by Eilene Ghazi, MD   Intraprocedure Staff edited

## 2023-06-30 NOTE — Addendum Note (Signed)
Addendum  created 06/30/23 0726 by Eilene Ghazi, MD   Intraprocedure Staff edited

## 2023-06-30 NOTE — Anesthesia Postprocedure Evaluation (Signed)
Anesthesia Post Note  Patient: Sabryn Preslar  Procedure(s) Performed: LEFT ARM BRACHIOCEPHALIC ARTERIOVENOUS (AV) FISTULA CREATION (Left: Arm Upper)     Patient location during evaluation: PACU Anesthesia Type: Regional and MAC Level of consciousness: awake and alert Pain management: pain level controlled Vital Signs Assessment: post-procedure vital signs reviewed and stable Respiratory status: spontaneous breathing, nonlabored ventilation, respiratory function stable and patient connected to nasal cannula oxygen Cardiovascular status: stable and blood pressure returned to baseline Postop Assessment: no apparent nausea or vomiting Anesthetic complications: no  No notable events documented.  Last Vitals:  Vitals:   06/19/23 1900 06/19/23 1915  BP: 105/80 (!) 111/56  Pulse: (!) 56 (!) 48  Resp: (!) 24 19  Temp:    SpO2: 99% 100%    Last Pain:  Vitals:   06/19/23 1900  TempSrc:   PainSc: 0-No pain                 Sukanya Goldblatt S

## 2023-07-10 ENCOUNTER — Other Ambulatory Visit: Payer: Self-pay | Admitting: *Deleted

## 2023-07-10 DIAGNOSIS — N186 End stage renal disease: Secondary | ICD-10-CM

## 2023-08-05 ENCOUNTER — Ambulatory Visit (INDEPENDENT_AMBULATORY_CARE_PROVIDER_SITE_OTHER): Payer: PPO | Admitting: Physician Assistant

## 2023-08-05 ENCOUNTER — Ambulatory Visit (HOSPITAL_COMMUNITY)
Admission: RE | Admit: 2023-08-05 | Discharge: 2023-08-05 | Disposition: A | Discharge status: Home / Self Care | Payer: PPO | Source: Ambulatory Visit | Attending: Vascular Surgery | Admitting: Vascular Surgery

## 2023-08-05 VITALS — BP 114/69 | HR 58 | Temp 95.9°F | Resp 18 | Ht 61.0 in | Wt 162.0 lb

## 2023-08-05 DIAGNOSIS — N186 End stage renal disease: Secondary | ICD-10-CM | POA: Diagnosis present

## 2023-08-05 NOTE — Progress Notes (Signed)
    Postoperative Access Visit   History of Present Illness   Rita [name] is a 67 y.o. year old female who presents for postoperative follow-up for: left brachiocephalic arteriovenous fistula (Date: 06/19/23).  The patient's wounds are healed.  The patient denies steal symptoms.  The patient is able to complete their activities of daily living.  She is currently dialyzing via right IJ [facility] on a Monday Wednesday Friday schedule in [place].   Physical Examination   Vitals:   08/05/23 0922  BP: 114/69  Pulse: (!) 58  Resp: 18  Temp: (!) 95.9 F (35.5 C)  TempSrc: Temporal  SpO2: 99%  Weight: 162 lb (73.5 kg)  Height: 5\' 1"  (1.549 m)   Body mass index is 30.61 kg/m.  left arm Incision is healed, palpable radial pulse, hand grip is 5/5, sensation in digits is intact, palpable thrill, bruit can be auscultated     Medical Decision Making   Rita [name] is a 67 y.o. year old female who presents s/p left brachiocephalic arteriovenous fistula  Patent L brachiocephalic fistula without signs or symptoms of steal syndrome We will recheck a fistula duplex in 1 month to see if fistula has matured adequately.  Patient is aware she may require fistulogram prior to cannulating Continue HD via right IJ [facility] for now   Emilie Rutter PA-C [facility] [place] Office: [phone]  Clinic MD: Chestine Spore

## 2023-08-20 ENCOUNTER — Other Ambulatory Visit: Payer: Self-pay | Admitting: *Deleted

## 2023-08-20 DIAGNOSIS — N186 End stage renal disease: Secondary | ICD-10-CM

## 2023-08-28 ENCOUNTER — Ambulatory Visit (HOSPITAL_COMMUNITY)
Admission: RE | Admit: 2023-08-28 | Discharge: 2023-08-28 | Disposition: A | Discharge status: Home / Self Care | Payer: PPO | Source: Ambulatory Visit | Attending: Vascular Surgery | Admitting: Vascular Surgery

## 2023-08-28 DIAGNOSIS — N186 End stage renal disease: Secondary | ICD-10-CM | POA: Insufficient documentation

## 2023-09-02 ENCOUNTER — Ambulatory Visit: Payer: PPO

## 2023-09-23 ENCOUNTER — Ambulatory Visit: Payer: PPO | Admitting: Physician Assistant

## 2023-09-23 ENCOUNTER — Other Ambulatory Visit: Payer: Self-pay

## 2023-09-23 VITALS — BP 132/71 | HR 67 | Temp 97.7°F | Resp 20 | Ht 61.0 in | Wt 173.1 lb

## 2023-09-23 DIAGNOSIS — N186 End stage renal disease: Secondary | ICD-10-CM

## 2023-09-23 MED ORDER — SODIUM CHLORIDE 0.9% FLUSH: 3.0000 mL | Freq: Two times a day (BID) | INTRAVENOUS | Status: DC

## 2023-09-23 MED ORDER — SODIUM CHLORIDE 0.9% FLUSH: 3.0000 mL | INTRAVENOUS | Status: DC | PRN

## 2023-09-23 MED ORDER — SODIUM CHLORIDE 0.9 % IV SOLN: 250.0000 mL | INTRAVENOUS | Status: AC | PRN

## 2023-09-23 NOTE — Progress Notes (Signed)
Office Note     CC:  follow up Requesting Provider:  Gordan Payment., MD  HPI: Rita [name] is a 67 y.o. (1956-06-22) female who presents for evaluation of left brachiocephalic fistula which was created by Dr. Chestine Spore on 06/19/2023.  She denies steal symptoms in her left hand.  She is dialyzing via right IJ [facility] on a Monday Wednesday Friday schedule in [place].  She believes the incision has completely healed in her left arm.   Past Medical History:  Diagnosis Date   Acute hypoxemic respiratory failure due to COVID-19 (HCC) 12/30/2019   Ambulates with cane    or walker   Anemia    Hx  Blood Transfusion in 04/2020 [facility]   Arthritis    Atrial fibrillation (HCC)    Cancer (HCC)    breast Cancer 35 yrs ago as of 06/17/23, no restrictions per patient for IV/Blood draws   Chronic respiratory failure with hypoxia, on home O2 therapy (HCC)    2L via [place] prn and qhs   CKD (chronic kidney disease), stage III (HCC)    dialysis M-W-F   COVID-19 12/30/2019   resolved   Diabetes mellitus without complication (HCC)    type 2 - diet controlled, no meds.  Patient does not check her CBGs.   Diastolic CHF (HCC)    Dyspnea    on oxygen 2L qhs via [place]   GI bleed    hx   Gout    hx - no current problems as of 06/17/23   Hypertension    Oxygen dependent    qhs and prn - 2L via [place]   Pneumonia    x several many yrs ago per patient on 06/17/23   Sleep apnea    does not use CPAP, uses oxygen 2L via [place]   Thrombocytopenia (HCC)     Past Surgical History:  Procedure Laterality Date   AV FISTULA PLACEMENT Left 06/19/2023   Procedure: LEFT ARM BRACHIOCEPHALIC ARTERIOVENOUS (AV) FISTULA CREATION;  Surgeon: Cephus Shelling, MD;  Location: [facility] OR;  Service: Vascular;  Laterality: Left;   COLONOSCOPY  02/2020   in [place]   HERNIA REPAIR  02/2019   IR THORACENTESIS ASP PLEURAL SPACE W/IMG GUIDE  06/28/2020   IR THORACENTESIS ASP PLEURAL SPACE W/IMG GUIDE  08/31/2020   IR THORACENTESIS ASP PLEURAL  SPACE W/IMG GUIDE  06/27/2022   PRESSURE SENSOR/CARDIOMEMS N/A 12/26/2020   Procedure: PRESSURE SENSOR/CARDIOMEMS;  Surgeon: Laurey Morale, MD;  Location: [facility] INVASIVE CV LAB;  Service: Cardiovascular;  Laterality: N/A;   RIGHT HEART CATH N/A 09/07/2020   Procedure: RIGHT HEART CATH;  Surgeon: Laurey Morale, MD;  Location: [facility] INVASIVE CV LAB;  Service: Cardiovascular;  Laterality: N/A;   RIGHT HEART CATH N/A 07/02/2022   Procedure: RIGHT HEART CATH;  Surgeon: Laurey Morale, MD;  Location: [facility] INVASIVE CV LAB;  Service: Cardiovascular;  Laterality: N/A;   UPPER GI ENDOSCOPY  02/2020   in [place]    Social History   Socioeconomic History   Marital status: Married    Spouse name: Not on file   Number of children: Not on file   Years of education: Not on file   Highest education level: Not on file  Occupational History   Not on file  Tobacco Use   Smoking status: Never   Smokeless tobacco: Never  Vaping Use   Vaping status: Never Used  Substance and Sexual Activity   Alcohol use: Never   Drug use:  Never   Sexual activity: Not Currently    Birth control/protection: Post-menopausal  Other Topics Concern   Not on file  Social History Narrative   Not on file   Social Determinants of Health   Financial Resource Strain: Not on file  Food Insecurity: Low Risk  (08/26/2023)   Received from [facility]   Hunger Vital Sign    Worried About Running Out of Food in the Last Year: Never true    Ran Out of Food in the Last Year: Never true  Transportation Needs: No Transportation Needs (08/26/2023)   Received from [facility]    In the past 12 months, has lack of reliable transportation kept you from medical appointments, meetings, work or from getting things needed for daily living? : No  Physical Activity: Not on file  Stress: Not on file  Social Connections: Unknown (04/23/2022)   Received from [facility], [facility]   Social Network    Social Network:  Not on file  Intimate Partner Violence: Unknown (03/15/2022)   Received from [facility], [facility]   HITS    Physically Hurt: Not on file    Insult or Talk Down To: Not on file    Threaten Physical Harm: Not on file    Scream or Curse: Not on file    Family History  Problem Relation Age of Onset   Cancer Mother     Current Outpatient Medications  Medication Sig Dispense Refill   acetaminophen (TYLENOL) 650 MG CR tablet Take 1,300 mg by mouth every 8 (eight) hours as needed for pain.     allopurinol (ZYLOPRIM) 100 MG tablet Take 100 mg by mouth 2 (two) times daily.     aspirin EC 81 MG tablet Take 81 mg by mouth daily. Swallow whole.     blood glucose meter kit and supplies Dispense based on patient and insurance preference. Use up to four times daily as directed. (FOR ICD-10 E10.9, E11.9). 1 each 0   Blood Glucose Monitoring Suppl (FIFTY50 GLUCOSE METER 2.0) w/Device KIT See admin instructions. (Patient not taking: Reported on 08/05/2023)     carvedilol (COREG) 6.25 MG tablet TAKE 1 TABLET(6.25 MG) BY MOUTH TWICE DAILY 180 tablet 1   ferrous sulfate 324 MG TBEC Take 324 mg by mouth daily with breakfast.     HYDROcodone-acetaminophen (NORCO) 5-325 MG tablet Take 1 tablet by mouth every 6 (six) hours as needed for moderate pain. (Patient not taking: Reported on 08/05/2023) 10 tablet 0   Menthol-Methyl Salicylate (MUSCLE RUB) 10-15 % CREA Apply 1 application topically as needed for muscle pain (knees).      Multiple Vitamin (MULTIVITAMIN WITH MINERALS) TABS tablet Take 1 tablet by mouth daily. Alive gummy bears     OXYGEN Inhale 2 L/min into the lungs at bedtime.     pantoprazole (PROTONIX) 40 MG tablet Take 40 mg by mouth daily.     polyethylene glycol (MIRALAX / GLYCOLAX) 17 g packet Take 17 g by mouth daily as needed for mild constipation or moderate constipation.     torsemide (DEMADEX) 100 MG tablet Take 1 tablet (100 mg total) by mouth 2 (two) times daily. 60 tablet 0   No  current facility-administered medications for this visit.    Allergies  Allergen Reactions   Codeine Other (See Comments)    Mouth sores    Prednisone Other (See Comments)    Delusions    Propoxyphene Itching   Moxifloxacin Rash   Nsaids  Rash     REVIEW OF SYSTEMS:   [X]  denotes positive finding, [ ]  denotes negative finding Cardiac  Comments:  Chest pain or chest pressure:    Shortness of breath upon exertion:    Short of breath when lying flat:    Irregular heart rhythm:        Vascular    Pain in calf, thigh, or hip brought on by ambulation:    Pain in feet at night that wakes you up from your sleep:     Blood clot in your veins:    Leg swelling:         Pulmonary    Oxygen at home:    Productive cough:     Wheezing:         Neurologic    Sudden weakness in arms or legs:     Sudden numbness in arms or legs:     Sudden onset of difficulty speaking or slurred speech:    Temporary loss of vision in one eye:     Problems with dizziness:         Gastrointestinal    Blood in stool:     Vomited blood:         Genitourinary    Burning when urinating:     Blood in urine:        Psychiatric    Major depression:         Hematologic    Bleeding problems:    Problems with blood clotting too easily:        Skin    Rashes or ulcers:        Constitutional    Fever or chills:      PHYSICAL EXAMINATION:  Vitals:   09/23/23 0836  BP: 132/71  Pulse: 67  Resp: 20  Temp: 97.7 F (36.5 C)  SpO2: 95%  Weight: 173 lb 1.6 oz (78.5 kg)  Height: 5\' 1"  (1.549 m)    General:  WDWN in NAD; vital signs documented above Gait: Not observed HENT: WNL, normocephalic Pulmonary: normal non-labored breathing , without Rales, rhonchi,  wheezing Cardiac: regular HR Abdomen: soft, NT, no masses Skin: without rashes Vascular Exam/Pulses: palpable radial pulse Extremities: Palpable thrill near anastomosis; palpable branches with a tortuous cephalic vein Musculoskeletal:  no muscle wasting or atrophy  Neurologic: A&O X 3 Psychiatric:  The pt has Normal affect.    ASSESSMENT/PLAN:: 67 y.o. female here for follow up for evaluation of left brachiocephalic fistula  Left brachiocephalic fistula is patent without signs or symptoms of steal syndrome in the left hand Fistula has a palpable thrill in the upper arm however on exam there are multiple side branches.  Cephalic vein is also tortuous in the upper arm.  Prior to cannulating we will further evaluate with a fistulogram in the near future.  I discussed with the patient possible need for branch ligation.  Patient and her husband are in agreement.  We will continue dialyzing via right IJ [facility] for now.  Office will contact her to schedule left arm fistulogram with possible intervention on a nondialysis day in the near future.   Emilie Rutter, PA-C [facility] [phone]  Clinic MD:   Chestine Spore

## 2023-10-02 ENCOUNTER — Ambulatory Visit (HOSPITAL_COMMUNITY)
Admission: RE | Admit: 2023-10-02 | Discharge: 2023-10-02 | Disposition: A | Discharge status: Home / Self Care | Payer: PPO | Attending: Vascular Surgery | Admitting: Vascular Surgery

## 2023-10-02 ENCOUNTER — Encounter (HOSPITAL_COMMUNITY)
Admission: RE | Disposition: A | Discharge status: Home / Self Care | Payer: Self-pay | Source: Home / Self Care | Attending: Vascular Surgery

## 2023-10-02 DIAGNOSIS — N186 End stage renal disease: Secondary | ICD-10-CM | POA: Diagnosis not present

## 2023-10-02 DIAGNOSIS — Y832 Surgical operation with anastomosis, bypass or graft as the cause of abnormal reaction of the patient, or of later complication, without mention of misadventure at the time of the procedure: Secondary | ICD-10-CM | POA: Insufficient documentation

## 2023-10-02 DIAGNOSIS — T82898A Other specified complication of vascular prosthetic devices, implants and grafts, initial encounter: Secondary | ICD-10-CM | POA: Diagnosis present

## 2023-10-02 DIAGNOSIS — N185 Chronic kidney disease, stage 5: Secondary | ICD-10-CM | POA: Diagnosis not present

## 2023-10-02 DIAGNOSIS — I5032 Chronic diastolic (congestive) heart failure: Secondary | ICD-10-CM | POA: Insufficient documentation

## 2023-10-02 DIAGNOSIS — E1122 Type 2 diabetes mellitus with diabetic chronic kidney disease: Secondary | ICD-10-CM | POA: Insufficient documentation

## 2023-10-02 DIAGNOSIS — I132 Hypertensive heart and chronic kidney disease with heart failure and with stage 5 chronic kidney disease, or end stage renal disease: Secondary | ICD-10-CM | POA: Insufficient documentation

## 2023-10-02 DIAGNOSIS — Z992 Dependence on renal dialysis: Secondary | ICD-10-CM | POA: Diagnosis not present

## 2023-10-02 HISTORY — PX: A/V FISTULAGRAM: CATH118298

## 2023-10-02 LAB — POCT I-STAT, CHEM 8
BUN: 26 mg/dL — ABNORMAL HIGH (ref 8–23)
Calcium, Ion: 1.13 mmol/L — ABNORMAL LOW (ref 1.15–1.40)
Chloride: 103 mmol/L (ref 98–111)
Creatinine, Ser: 4.7 mg/dL — ABNORMAL HIGH (ref 0.44–1.00)
Glucose, Bld: 84 mg/dL (ref 70–99)
HCT: 33 % — ABNORMAL LOW (ref 36.0–46.0)
Hemoglobin: 11.2 g/dL — ABNORMAL LOW (ref 12.0–15.0)
Potassium: 3.9 mmol/L (ref 3.5–5.1)
Sodium: 138 mmol/L (ref 135–145)
TCO2: 24 mmol/L (ref 22–32)

## 2023-10-02 SURGERY — A/V FISTULAGRAM
Anesthesia: LOCAL | Laterality: Left

## 2023-10-02 MED ORDER — HEPARIN (PORCINE) IN NACL 1000-0.9 UT/500ML-% IV SOLN
INTRAVENOUS | Status: DC | PRN
  Administered 2023-10-02: 500 mL

## 2023-10-02 MED ORDER — IODIXANOL 320 MG/ML IV SOLN
INTRAVENOUS | Status: DC | PRN
  Administered 2023-10-02: 25 mL

## 2023-10-02 MED ORDER — LIDOCAINE HCL (PF) 1 % IJ SOLN
INTRAMUSCULAR | Status: DC | PRN
  Administered 2023-10-02: 10 mL

## 2023-10-02 SURGICAL SUPPLY — 6 items
COVER DOME SNAP 22 D (MISCELLANEOUS) ×1 IMPLANT
KIT MICROPUNCTURE NIT STIFF (SHEATH) IMPLANT
SHEATH PROBE COVER 6X72 (BAG) ×1 IMPLANT
TRAY PV CATH (CUSTOM PROCEDURE TRAY) ×1 IMPLANT
TUBING CIL FLEX 10 FLL-RA (TUBING) ×1 IMPLANT
WIRE BENTSON .035X145CM (WIRE) IMPLANT

## 2023-10-02 NOTE — Op Note (Signed)
    OPERATIVE NOTE  DATE: October 02, 2023  PROCEDURE: left brachiocephalic arteriovenous fistula cannulation under ultrasound guidance left arm fistulogram including central venogram  PRE-OPERATIVE DIAGNOSIS: Slow to mature left arm arteriovenous fistula  POST-OPERATIVE DIAGNOSIS: same as above   SURGEON: Cephus Shelling, MD  ANESTHESIA: local  ESTIMATED BLOOD LOSS: 5 cc  FINDING(S): The left brachiocephalic AV fistula was widely patent.  No evidence of central venous stenosis with a left IJ TDC.  There are multiple side branches that require ligation to facilitate maturation.  SPECIMEN(S):  None  CONTRAST: 25  mL  INDICATIONS: Rita [name] is a 67 y.o. female who  presents with slow to mature left brachiocephalic arteriovenous fistula.  The patient is scheduled for left arm fistulogram.  The patient is aware the risks include but are not limited to: bleeding, infection, thrombosis of the cannulated access, and possible anaphylactic reaction to the contrast.  The patient is aware of the risks of the procedure and elects to proceed forward.  DESCRIPTION: After full informed written consent was obtained, the patient was brought back to the angiography suite and placed supine upon the angiography table.  The patient was connected to monitoring equipment.  The left arm was prepped and draped in the standard fashion for a left arm fistulogram.  Under ultrasound guidance, the left arteriovenous fistula was evaluated, it was patent, an image was saved.  It was cannulated with a micropuncture needle.  The microwire was advanced into the fistula and the needle was exchanged for the a microsheath, which was lodged 2 cm into the access.  The wire was removed and the sheath was connected to the IV extension tubing.  Hand injections were completed to image the access from the antecubitum up to the level of axilla.  The central venous structures were also imaged by hand  injections.  Based on the images, this patient will need: side branch ligation.  A 4-0 Monocryl purse-string suture was sewn around the sheath.  The sheath was removed while tying down the suture.  A sterile bandage was applied to the puncture site.  COMPLICATIONS: None  CONDITION: Stable  Cephus Shelling, MD [facility] Office: [phone]  Cephus Shelling   10/02/2023 8:34 AM

## 2023-10-02 NOTE — H&P (Signed)
History and Physical Interval Note:  10/02/2023 7:31 AM  Rita [name]  has presented today for surgery, with the diagnosis of end stage renal.  The various methods of treatment have been discussed with the patient and family. After consideration of risks, benefits and other options for treatment, the patient has consented to  Procedure(s): A/V Fistulagram (Left) as a surgical intervention.  The patient's history has been reviewed, patient examined, no change in status, stable for surgery.  I have reviewed the patient's chart and labs.  Questions were answered to the patient's satisfaction.     Cephus Shelling   Office Note        CC:  follow up Requesting Provider:  Gordan Payment., MD   HPI: Rita [name] is a 67 y.o. (06/24/1956) female who presents for evaluation of left brachiocephalic fistula which was created by Dr. Chestine Spore on 06/19/2023.  She denies steal symptoms in her left hand.  She is dialyzing via right IJ [facility] on a Monday Wednesday Friday schedule in [place].  She believes the incision has completely healed in her left arm.         Past Medical History:  Diagnosis Date   Acute hypoxemic respiratory failure due to COVID-19 (HCC) 12/30/2019   Ambulates with cane      or walker   Anemia      Hx  Blood Transfusion in 04/2020 [facility]   Arthritis     Atrial fibrillation (HCC)     Cancer (HCC)      breast Cancer 35 yrs ago as of 06/17/23, no restrictions per patient for IV/Blood draws   Chronic respiratory failure with hypoxia, on home O2 therapy (HCC)      2L via [place] prn and qhs   CKD (chronic kidney disease), stage III (HCC)      dialysis M-W-F   COVID-19 12/30/2019    resolved   Diabetes mellitus without complication (HCC)      type 2 - diet controlled, no meds.  Patient does not check her CBGs.   Diastolic CHF (HCC)     Dyspnea      on oxygen 2L qhs via [place]   GI bleed      hx   Gout      hx - no current problems as of 06/17/23   Hypertension      Oxygen dependent      qhs and prn - 2L via [place]   Pneumonia      x several many yrs ago per patient on 06/17/23   Sleep apnea      does not use CPAP, uses oxygen 2L via    Thrombocytopenia (HCC)                 Past Surgical History:  Procedure Laterality Date   AV FISTULA PLACEMENT Left 06/19/2023    Procedure: LEFT ARM BRACHIOCEPHALIC ARTERIOVENOUS (AV) FISTULA CREATION;  Surgeon: Cephus Shelling, MD;  Location: [facility] OR;  Service: Vascular;  Laterality: Left;   COLONOSCOPY   02/2020    in [place]   HERNIA REPAIR   02/2019   IR THORACENTESIS ASP PLEURAL SPACE W/IMG GUIDE   06/28/2020   IR THORACENTESIS ASP PLEURAL SPACE W/IMG GUIDE   08/31/2020   IR THORACENTESIS ASP PLEURAL SPACE W/IMG GUIDE   06/27/2022   PRESSURE SENSOR/CARDIOMEMS N/A 12/26/2020    Procedure: PRESSURE SENSOR/CARDIOMEMS;  Surgeon: Laurey Morale, MD;  Location: [facility] INVASIVE CV LAB;  Service: Cardiovascular;  Laterality: N/A;   RIGHT HEART CATH N/A 09/07/2020    Procedure: RIGHT HEART CATH;  Surgeon: Laurey Morale, MD;  Location: [facility] INVASIVE CV LAB;  Service: Cardiovascular;  Laterality: N/A;   RIGHT HEART CATH N/A 07/02/2022    Procedure: RIGHT HEART CATH;  Surgeon: Laurey Morale, MD;  Location: [facility] INVASIVE CV LAB;  Service: Cardiovascular;  Laterality: N/A;   UPPER GI ENDOSCOPY   02/2020    in [facility]          Social History         Socioeconomic History   Marital status: Married      Spouse name: Not on file   Number of children: Not on file   Years of education: Not on file   Highest education level: Not on file  Occupational History   Not on file  Tobacco Use   Smoking status: Never   Smokeless tobacco: Never  Vaping Use   Vaping status: Never Used  Substance and Sexual Activity   Alcohol use: Never   Drug use: Never   Sexual activity: Not Currently      Birth control/protection: Post-menopausal  Other Topics Concern   Not on file  Social History Narrative   Not on file    Social  Determinants of Health        Financial Resource Strain: Not on file  Food Insecurity: Low Risk  (08/26/2023)    Received from [facility]    Hunger Vital Sign     Worried About Running Out of Food in the Last Year: Never true     Ran Out of Food in the Last Year: Never true  Transportation Needs: No Transportation Needs (08/26/2023)    Received from [employer]     In the past 12 months, has lack of reliable transportation kept you from medical appointments, meetings, work or from getting things needed for daily living? : No  Physical Activity: Not on file  Stress: Not on file  Social Connections: Unknown (04/23/2022)    Received from [facility]    Social Network     Social Network: Not on file  Intimate Partner Violence: Unknown (03/15/2022)    Received from [facility]    HITS     Physically Hurt: Not on file     Insult or Talk Down To: Not on file     Threaten Physical Harm: Not on file     Scream or Curse: Not on file           Family History  Problem Relation Age of Onset   Cancer Mother                  Current Outpatient Medications  Medication Sig Dispense Refill   acetaminophen (TYLENOL) 650 MG CR tablet Take 1,300 mg by mouth every 8 (eight) hours as needed for pain.       allopurinol (ZYLOPRIM) 100 MG tablet Take 100 mg by mouth 2 (two) times daily.       aspirin EC 81 MG tablet Take 81 mg by mouth daily. Swallow whole.       blood glucose meter kit and supplies Dispense based on patient and insurance preference. Use up to four times daily as directed. (FOR ICD-10 E10.9, E11.9). 1 each 0   Blood Glucose Monitoring Suppl (FIFTY50 GLUCOSE METER 2.0) w/Device KIT See admin instructions. (Patient not taking: Reported on 08/05/2023)  carvedilol (COREG) 6.25 MG tablet TAKE 1 TABLET(6.25 MG) BY MOUTH TWICE DAILY 180 tablet 1   ferrous sulfate 324 MG TBEC Take 324 mg by mouth daily with breakfast.        HYDROcodone-acetaminophen (NORCO) 5-325 MG tablet Take 1 tablet by mouth every 6 (six) hours as needed for moderate pain. (Patient not taking: Reported on 08/05/2023) 10 tablet 0   Menthol-Methyl Salicylate (MUSCLE RUB) 10-15 % CREA Apply 1 application topically as needed for muscle pain (knees).        Multiple Vitamin (MULTIVITAMIN WITH MINERALS) TABS tablet Take 1 tablet by mouth daily. Alive gummy bears       OXYGEN Inhale 2 L/min into the lungs at bedtime.       pantoprazole (PROTONIX) 40 MG tablet Take 40 mg by mouth daily.       polyethylene glycol (MIRALAX / GLYCOLAX) 17 g packet Take 17 g by mouth daily as needed for mild constipation or moderate constipation.       torsemide (DEMADEX) 100 MG tablet Take 1 tablet (100 mg total) by mouth 2 (two) times daily. 60 tablet 0      No current facility-administered medications for this visit.        Allergies       Allergies  Allergen Reactions   Codeine Other (See Comments)      Mouth sores    Prednisone Other (See Comments)      Delusions    Propoxyphene Itching   Moxifloxacin Rash   Nsaids Rash          REVIEW OF SYSTEMS:    [X]  denotes positive finding, [ ]  denotes negative finding Cardiac   Comments:  Chest pain or chest pressure:      Shortness of breath upon exertion:      Short of breath when lying flat:      Irregular heart rhythm:             Vascular      Pain in calf, thigh, or hip brought on by ambulation:      Pain in feet at night that wakes you up from your sleep:       Blood clot in your veins:      Leg swelling:              Pulmonary      Oxygen at home:      Productive cough:       Wheezing:              Neurologic      Sudden weakness in arms or legs:       Sudden numbness in arms or legs:       Sudden onset of difficulty speaking or slurred speech:      Temporary loss of vision in one eye:       Problems with dizziness:              Gastrointestinal      Blood in stool:       Vomited  blood:              Genitourinary      Burning when urinating:       Blood in urine:             Psychiatric      Major depression:              Hematologic      Bleeding problems:  Problems with blood clotting too easily:             Skin      Rashes or ulcers:             Constitutional      Fever or chills:          PHYSICAL EXAMINATION:      Vitals:    09/23/23 0836  BP: 132/71  Pulse: 67  Resp: 20  Temp: 97.7 F (36.5 C)  SpO2: 95%  Weight: 173 lb 1.6 oz (78.5 kg)  Height: 5\' 1"  (1.549 m)      General:  WDWN in NAD; vital signs documented above Gait: Not observed HENT: WNL, normocephalic Pulmonary: normal non-labored breathing , without Rales, rhonchi,  wheezing Cardiac: regular HR Abdomen: soft, NT, no masses Skin: without rashes Vascular Exam/Pulses: palpable radial pulse Extremities: Palpable thrill near anastomosis; palpable branches with a tortuous cephalic vein Musculoskeletal: no muscle wasting or atrophy       Neurologic: A&O X 3 Psychiatric:  The pt has Normal affect.       ASSESSMENT/PLAN:: 67 y.o. female here for follow up for evaluation of left brachiocephalic fistula   Left brachiocephalic fistula is patent without signs or symptoms of steal syndrome in the left hand Fistula has a palpable thrill in the upper arm however on exam there are multiple side branches.  Cephalic vein is also tortuous in the upper arm.  Prior to cannulating we will further evaluate with a fistulogram in the near future.  I discussed with the patient possible need for branch ligation.  Patient and her husband are in agreement.  We will continue dialyzing via right IJ [facility] for now.  Office will contact her to schedule left arm fistulogram with possible intervention on a nondialysis day in the near future.     Emilie Rutter, PA-C [facility] [phone]   Clinic MD:   Chestine Spore

## 2023-10-03 ENCOUNTER — Telehealth: Payer: Self-pay

## 2023-10-03 ENCOUNTER — Other Ambulatory Visit: Payer: Self-pay

## 2023-10-03 ENCOUNTER — Encounter (HOSPITAL_COMMUNITY): Payer: Self-pay | Admitting: Vascular Surgery

## 2023-10-03 DIAGNOSIS — N186 End stage renal disease: Secondary | ICD-10-CM

## 2023-10-03 NOTE — Telephone Encounter (Signed)
Patient returned call. Scheduled for surgery on 10/30. Instructions provided and she voiced understanding.

## 2023-10-03 NOTE — Telephone Encounter (Signed)
Attempted to reach patient at both contact numbers to schedule left arm AVF revision with sidebranch ligation. Able to LVM to return call on cell number.

## 2023-10-06 ENCOUNTER — Encounter (HOSPITAL_COMMUNITY): Payer: Self-pay | Admitting: Vascular Surgery

## 2023-10-06 NOTE — Progress Notes (Signed)
SDW call  Patient was given pre-op instructions over the phone. Patient verbalized understanding of instructions provided.     PCP - Dr. Feliciana Rossetti HF Cardiologist - Dr. Shirlee Latch Pulmonary:    PPM/ICD - Patient has an [employer] Cardio MEMS to monitor HF Device Orders - none needed Rep Notified - No   Chest x-ray - 06/27/2022 EKG -  03/21/2023 Stress Test - ECHO - 04/08/2023 Cardiac Cath - 07/02/2022  Sleep Study/sleep apnea/CPAP: Diagnosed with sleep apnea, wears oxygen 2L/[place] at all times  Type II diabetic: States she is not on any medications for diabetes Fasting Blood sugar range: Does not check her sugars How often check sugars: does not check her sugars   Blood Thinner Instructions: denies Aspirin Instructions:continue   ERAS Protcol - NPO   COVID TEST- na    Anesthesia review: Yes. HTN, CHF, SOB, DM, ESRD with dialysis, on home oxygen.  Has a cardioMEMS from [employer]   Patient denies shortness of breath, fever, cough and chest pain over the phone call  Your procedure is scheduled on Wednesday October 08, 2023  Report to [facility] Main Entrance "A" at  0945  A.M., then check in with the Admitting office.  Call this number if you have problems the morning of surgery:  [phone]   If you have any questions prior to your surgery date call [phone]: Open Monday-Friday 8am-4pm If you experience any cold or flu symptoms such as cough, fever, chills, shortness of breath, etc. between now and your scheduled surgery, please notify us at the above number    Remember:  Do not eat or drink after midnight the night before your surgery  Take these medicines the morning of surgery with A SIP OF WATER:  Allopurinol, protonix, ASA  As needed: tylenol  As of today, STOP taking any Aleve, Naproxen, Ibuprofen, Motrin, Advil, Goody's, BC's, all herbal medications, fish oil, and all vitamins.

## 2023-10-07 NOTE — Progress Notes (Signed)
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  Most recent echo 04/08/2023 showed EF 40 to 45%, low normal RV function, RVSP 64 mmHg, mild to moderate tricuspid regurgitation, mild mitral regurgitation.  Last seen by Dr. Shirlee Latch 03/21/2023.  Per note, her breathing was noted to be better now that she is on HD; able to walk with cane or walker without dyspnea on flat ground.  Per clearance scanned under media tab 06/16/2023, Dr. Shirlee Latch cleared patient to proceed with surgery at moderate risk from cardiac standpoint.   Underwent left 1st stage fistula creation 06/19/23 without complication.  History of left-sided breast cancer 30 years ago treated with surgery and chemoradiation.   IDDM2, A1c 7.4 on 01/09/2023.   History of chronic thrombocytopenia.   Will need DOS labs and evaluation.   EKG 03/21/2023: Atrial fibrillation.  Rate 72.  Right axis deviation.  Nonspecific intraventricular conduction delay.   TTE 04/08/2023:  1. Left ventricular ejection fraction, by estimation, is 40 to 45%. Left  ventricular ejection fraction by 3D volume is 45 %. The left ventricle has  mildly decreased function. The left ventricle demonstrates global  hypokinesis. Left ventricular diastolic   function could not be evaluated.   2. Right ventricular systolic function is low normal. The right  ventricular size is mildly enlarged. There is severely elevated pulmonary  artery systolic pressure. The estimated right ventricular systolic  pressure is 64.0 mmHg.   3. Left atrial size was mildly dilated.   4. Right atrial size was moderately dilated.    5. The mitral valve is abnormal. Mild mitral valve regurgitation.   6. Tricuspid valve regurgitation is mild to moderate.   7. The aortic valve is tricuspid. Aortic valve regurgitation is not  visualized.   8. The inferior vena cava is dilated in size with <50% respiratory  variability, suggesting right atrial pressure of 15 mmHg.   9. Cannot exclude a small PFO.  10. Rhythm strip during this exam demonstrates atrial fibrillation.   Comparison(s): Changes from prior study are noted. 12/28/2021: LVEF 55-60%.      Zannie Cove [facility] Short Stay Center/Anesthesiology Phone [phone] 10/07/2023 10:52 AM

## 2023-10-07 NOTE — Anesthesia Preprocedure Evaluation (Signed)
Anesthesia Evaluation  Patient identified by MRN, date of birth, ID band Patient awake    Reviewed: Allergy & Precautions, NPO status , Patient's Chart, lab work & pertinent test results  Airway Mallampati: III  TM Distance: <3 FB Neck ROM: Full  Mouth opening: Limited Mouth Opening  Dental  (+) Missing   Pulmonary shortness of breath and Long-Term Oxygen Therapy, sleep apnea and Oxygen sleep apnea    breath sounds clear to auscultation       Cardiovascular hypertension, Pt. on medications +CHF   Rhythm:Regular Rate:Normal     Neuro/Psych negative neurological ROS     GI/Hepatic negative GI ROS, Neg liver ROS,,,  Endo/Other  diabetes, Type 2    Renal/GU ESRFRenal disease     Musculoskeletal  (+) Arthritis ,    Abdominal   Peds  Hematology  (+) Blood dyscrasia, anemia   Anesthesia Other Findings   Reproductive/Obstetrics                             Anesthesia Physical Anesthesia Plan  ASA: 4  Anesthesia Plan: Regional and MAC   Post-op Pain Management: Regional block* and Tylenol PO (pre-op)*   Induction:   PONV Risk Score and Plan: 2 and Propofol infusion and Ondansetron  Airway Management Planned: Natural Airway and Simple Face Mask  Additional Equipment:   Intra-op Plan:   Post-operative Plan:   Informed Consent: I have reviewed the patients History and Physical, chart, labs and discussed the procedure including the risks, benefits and alternatives for the proposed anesthesia with the patient or authorized representative who has indicated his/her understanding and acceptance.       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  Most recent echo 04/08/2023 showed EF 40 to 45%, low normal RV function, RVSP 64 mmHg, mild to moderate tricuspid regurgitation, mild mitral regurgitation.  Last seen by Dr. Shirlee Latch 03/21/2023.  Per note, her breathing was noted to be better now that she is on HD; able to walk with cane or walker without dyspnea on flat ground.  Per clearance scanned under media tab 06/16/2023, Dr. Shirlee Latch cleared patient to proceed with surgery at moderate risk from cardiac standpoint.  Underwent left 1st stage fistula creation 06/19/23 without complication.  History of left-sided breast cancer 30 years ago treated with surgery and chemoradiation.  IDDM2, A1c 7.4 on 01/09/2023.  History of chronic thrombocytopenia.  Will need DOS labs and evaluation.  EKG 03/21/2023: Atrial fibrillation.  Rate 72.  Right axis deviation.  Nonspecific intraventricular conduction delay.  TTE 04/08/2023: 1. Left ventricular ejection fraction, by estimation, is 40 to 45%. Left  ventricular ejection fraction by 3D volume is 45 %. The left ventricle has  mildly decreased function. The left ventricle demonstrates global  hypokinesis. Left ventricular diastolic  function could not be evaluated.  2. Right ventricular systolic function is low normal. The right  ventricular size is mildly enlarged. There is severely elevated pulmonary  artery systolic pressure. The estimated right ventricular systolic  pressure is 64.0 mmHg.  3. Left atrial size was mildly dilated.  4.  Right atrial size was moderately dilated.  5. The mitral valve is abnormal. Mild mitral valve regurgitation.  6. Tricuspid valve regurgitation is mild to moderate.  7. The aortic valve is tricuspid. Aortic valve regurgitation is not  visualized.  8. The inferior vena cava is dilated in  size with <50% respiratory  variability, suggesting right atrial pressure of 15 mmHg.  9. Cannot exclude a small PFO.  10. Rhythm strip during this exam demonstrates atrial fibrillation.   Comparison(s): Changes from prior study are noted. 12/28/2021: LVEF 55-60%.   )        Anesthesia Quick Evaluation

## 2023-10-08 ENCOUNTER — Encounter (HOSPITAL_COMMUNITY): Payer: Self-pay | Admitting: Vascular Surgery

## 2023-10-08 ENCOUNTER — Ambulatory Visit (HOSPITAL_COMMUNITY): Payer: Self-pay | Admitting: Physician Assistant

## 2023-10-08 ENCOUNTER — Other Ambulatory Visit: Payer: Self-pay

## 2023-10-08 ENCOUNTER — Encounter (HOSPITAL_COMMUNITY)
Admission: RE | Disposition: A | Discharge status: Home / Self Care | Payer: Self-pay | Source: Home / Self Care | Attending: Vascular Surgery

## 2023-10-08 ENCOUNTER — Other Ambulatory Visit (HOSPITAL_COMMUNITY): Payer: Self-pay

## 2023-10-08 ENCOUNTER — Ambulatory Visit (HOSPITAL_COMMUNITY)
Admission: RE | Admit: 2023-10-08 | Discharge: 2023-10-08 | Disposition: A | Discharge status: Home / Self Care | Payer: PPO | Attending: Vascular Surgery | Admitting: Vascular Surgery

## 2023-10-08 DIAGNOSIS — T829XXA Unspecified complication of cardiac and vascular prosthetic device, implant and graft, initial encounter: Secondary | ICD-10-CM | POA: Insufficient documentation

## 2023-10-08 DIAGNOSIS — E1122 Type 2 diabetes mellitus with diabetic chronic kidney disease: Secondary | ICD-10-CM

## 2023-10-08 DIAGNOSIS — I509 Heart failure, unspecified: Secondary | ICD-10-CM | POA: Diagnosis not present

## 2023-10-08 DIAGNOSIS — I5032 Chronic diastolic (congestive) heart failure: Secondary | ICD-10-CM | POA: Insufficient documentation

## 2023-10-08 DIAGNOSIS — N186 End stage renal disease: Secondary | ICD-10-CM

## 2023-10-08 DIAGNOSIS — N185 Chronic kidney disease, stage 5: Secondary | ICD-10-CM | POA: Diagnosis not present

## 2023-10-08 DIAGNOSIS — I132 Hypertensive heart and chronic kidney disease with heart failure and with stage 5 chronic kidney disease, or end stage renal disease: Secondary | ICD-10-CM | POA: Diagnosis not present

## 2023-10-08 DIAGNOSIS — Z8616 Personal history of COVID-19: Secondary | ICD-10-CM | POA: Insufficient documentation

## 2023-10-08 DIAGNOSIS — Z853 Personal history of malignant neoplasm of breast: Secondary | ICD-10-CM | POA: Insufficient documentation

## 2023-10-08 DIAGNOSIS — Z9981 Dependence on supplemental oxygen: Secondary | ICD-10-CM | POA: Diagnosis not present

## 2023-10-08 DIAGNOSIS — T82598A Other mechanical complication of other cardiac and vascular devices and implants, initial encounter: Secondary | ICD-10-CM | POA: Diagnosis not present

## 2023-10-08 DIAGNOSIS — I4891 Unspecified atrial fibrillation: Secondary | ICD-10-CM | POA: Insufficient documentation

## 2023-10-08 DIAGNOSIS — Z992 Dependence on renal dialysis: Secondary | ICD-10-CM | POA: Insufficient documentation

## 2023-10-08 DIAGNOSIS — I10 Essential (primary) hypertension: Secondary | ICD-10-CM

## 2023-10-08 DIAGNOSIS — T82898A Other specified complication of vascular prosthetic devices, implants and grafts, initial encounter: Secondary | ICD-10-CM | POA: Diagnosis not present

## 2023-10-08 DIAGNOSIS — X58XXXA Exposure to other specified factors, initial encounter: Secondary | ICD-10-CM | POA: Diagnosis not present

## 2023-10-08 DIAGNOSIS — E119 Type 2 diabetes mellitus without complications: Secondary | ICD-10-CM

## 2023-10-08 HISTORY — PX: REVISON OF ARTERIOVENOUS FISTULA: SHX6074

## 2023-10-08 HISTORY — PX: LIGATION OF ARTERIOVENOUS  FISTULA: SHX5948

## 2023-10-08 LAB — POCT I-STAT, CHEM 8
BUN: 40 mg/dL — ABNORMAL HIGH (ref 8–23)
Calcium, Ion: 1.06 mmol/L — ABNORMAL LOW (ref 1.15–1.40)
Chloride: 99 mmol/L (ref 98–111)
Creatinine, Ser: 7 mg/dL — ABNORMAL HIGH (ref 0.44–1.00)
Glucose, Bld: 86 mg/dL (ref 70–99)
HCT: 37 % (ref 36.0–46.0)
Hemoglobin: 12.6 g/dL (ref 12.0–15.0)
Potassium: 4 mmol/L (ref 3.5–5.1)
Sodium: 137 mmol/L (ref 135–145)
TCO2: 26 mmol/L (ref 22–32)

## 2023-10-08 LAB — GLUCOSE, CAPILLARY
Glucose-Capillary: 80 mg/dL (ref 70–99)
Glucose-Capillary: 80 mg/dL (ref 70–99)
Glucose-Capillary: 139 mg/dL — ABNORMAL HIGH (ref 70–99)

## 2023-10-08 SURGERY — REVISON OF ARTERIOVENOUS FISTULA
Anesthesia: Monitor Anesthesia Care | Site: Arm Upper | Laterality: Left

## 2023-10-08 MED ORDER — PROPOFOL 10 MG/ML IV BOLUS
INTRAVENOUS | Status: AC
  Filled 2023-10-08: qty 20

## 2023-10-08 MED ORDER — CHLORHEXIDINE GLUCONATE 4 % EX SOLN: 60.0000 mL | Freq: Once | CUTANEOUS | Status: DC

## 2023-10-08 MED ORDER — PROPOFOL 500 MG/50ML IV EMUL
INTRAVENOUS | Status: DC | PRN
  Administered 2023-10-08: 100 ug/kg/min via INTRAVENOUS

## 2023-10-08 MED ORDER — MIDAZOLAM HCL 2 MG/2ML IJ SOLN
INTRAMUSCULAR | Status: AC
  Filled 2023-10-08: qty 2

## 2023-10-08 MED ORDER — FENTANYL CITRATE (PF) 100 MCG/2ML IJ SOLN
INTRAMUSCULAR | Status: AC
  Filled 2023-10-08: qty 2

## 2023-10-08 MED ORDER — FENTANYL CITRATE (PF) 250 MCG/5ML IJ SOLN
INTRAMUSCULAR | Status: AC
  Filled 2023-10-08: qty 5

## 2023-10-08 MED ORDER — FENTANYL CITRATE PF 50 MCG/ML IJ SOSY
50.0000 ug | PREFILLED_SYRINGE | Freq: Once | INTRAMUSCULAR | Status: AC
  Administered 2023-10-08: 50 ug via INTRAVENOUS
  Filled 2023-10-08: qty 1

## 2023-10-08 MED ORDER — LIDOCAINE 2% (20 MG/ML) 5 ML SYRINGE
INTRAMUSCULAR | Status: AC
  Filled 2023-10-08: qty 5

## 2023-10-08 MED ORDER — EPHEDRINE 5 MG/ML INJ
INTRAVENOUS | Status: AC
  Filled 2023-10-08: qty 5

## 2023-10-08 MED ORDER — HEPARIN 6000 UNIT IRRIGATION SOLUTION
Status: DC | PRN
  Administered 2023-10-08: 1

## 2023-10-08 MED ORDER — 0.9 % SODIUM CHLORIDE (POUR BTL) OPTIME
TOPICAL | Status: DC | PRN
  Administered 2023-10-08: 1000 mL

## 2023-10-08 MED ORDER — PROTAMINE SULFATE 10 MG/ML IV SOLN
INTRAVENOUS | Status: AC
  Filled 2023-10-08: qty 25

## 2023-10-08 MED ORDER — ONDANSETRON HCL 4 MG/2ML IJ SOLN
INTRAMUSCULAR | Status: DC | PRN
  Administered 2023-10-08: 4 mg via INTRAVENOUS

## 2023-10-08 MED ORDER — ONDANSETRON HCL 4 MG/2ML IJ SOLN
INTRAMUSCULAR | Status: AC
Start: 2023-10-08 — End: ?
  Filled 2023-10-08: qty 2

## 2023-10-08 MED ORDER — LIDOCAINE-EPINEPHRINE (PF) 1.5 %-1:200000 IJ SOLN
INTRAMUSCULAR | Status: DC | PRN
  Administered 2023-10-08: 30 mL via PERINEURAL

## 2023-10-08 MED ORDER — PHENYLEPHRINE 80 MCG/ML (10ML) SYRINGE FOR IV PUSH (FOR BLOOD PRESSURE SUPPORT)
PREFILLED_SYRINGE | INTRAVENOUS | Status: AC
  Filled 2023-10-08: qty 10

## 2023-10-08 MED ORDER — EPHEDRINE SULFATE (PRESSORS) 50 MG/ML IJ SOLN
INTRAMUSCULAR | Status: DC | PRN
  Administered 2023-10-08 (×2): 10 mg via INTRAVENOUS

## 2023-10-08 MED ORDER — LIDOCAINE 2% (20 MG/ML) 5 ML SYRINGE
INTRAMUSCULAR | Status: DC | PRN
  Administered 2023-10-08: 40 mg via INTRAVENOUS

## 2023-10-08 MED ORDER — CHLORHEXIDINE GLUCONATE 0.12 % MT SOLN: 15.0000 mL | Freq: Once | OROMUCOSAL | Status: AC

## 2023-10-08 MED ORDER — SODIUM CHLORIDE 0.9 % IV SOLN: INTRAVENOUS | Status: DC | PRN

## 2023-10-08 MED ORDER — FENTANYL CITRATE (PF) 100 MCG/2ML IJ SOLN
INTRAMUSCULAR | Status: DC | PRN
  Administered 2023-10-08 (×2): 25 ug via INTRAVENOUS

## 2023-10-08 MED ORDER — MIDAZOLAM HCL 2 MG/2ML IJ SOLN
0.5000 mg | Freq: Once | INTRAMUSCULAR | Status: AC
  Administered 2023-10-08: 0.5 mg via INTRAVENOUS

## 2023-10-08 MED ORDER — DEXAMETHASONE SODIUM PHOSPHATE 10 MG/ML IJ SOLN
INTRAMUSCULAR | Status: DC | PRN
  Administered 2023-10-08: 4 mg via INTRAVENOUS

## 2023-10-08 MED ORDER — HEPARIN SODIUM (PORCINE) 1000 UNIT/ML IJ SOLN
INTRAMUSCULAR | Status: AC
Start: 2023-10-08 — End: ?
  Filled 2023-10-08: qty 10

## 2023-10-08 MED ORDER — DEXAMETHASONE SODIUM PHOSPHATE 10 MG/ML IJ SOLN
INTRAMUSCULAR | Status: AC
  Filled 2023-10-08: qty 1

## 2023-10-08 MED ORDER — PHENYLEPHRINE 80 MCG/ML (10ML) SYRINGE FOR IV PUSH (FOR BLOOD PRESSURE SUPPORT)
PREFILLED_SYRINGE | INTRAVENOUS | Status: DC | PRN
  Administered 2023-10-08 (×2): 80 ug via INTRAVENOUS

## 2023-10-08 MED ORDER — HYDROCODONE-ACETAMINOPHEN 5-325 MG PO TABS
1.0000 | ORAL_TABLET | ORAL | 0 refills | Status: DC | PRN
  Filled 2023-10-08: qty 7, 2d supply, fill #0

## 2023-10-08 MED ORDER — CEFAZOLIN SODIUM-DEXTROSE 2-4 GM/100ML-% IV SOLN
2.0000 g | INTRAVENOUS | Status: AC
  Administered 2023-10-08: 2 g via INTRAVENOUS

## 2023-10-08 MED ORDER — CEFAZOLIN SODIUM-DEXTROSE 2-4 GM/100ML-% IV SOLN
INTRAVENOUS | Status: AC
  Filled 2023-10-08: qty 100

## 2023-10-08 MED ORDER — LIDOCAINE HCL (PF) 1 % IJ SOLN
INTRAMUSCULAR | Status: AC
  Filled 2023-10-08: qty 30

## 2023-10-08 MED ORDER — HEPARIN 6000 UNIT IRRIGATION SOLUTION
Status: AC
  Filled 2023-10-08: qty 500

## 2023-10-08 MED ORDER — PROPOFOL 10 MG/ML IV BOLUS
INTRAVENOUS | Status: DC | PRN
  Administered 2023-10-08: 60 mg via INTRAVENOUS

## 2023-10-08 MED ORDER — CHLORHEXIDINE GLUCONATE 0.12 % MT SOLN
OROMUCOSAL | Status: AC
  Administered 2023-10-08: 15 mL
  Filled 2023-10-08: qty 15

## 2023-10-08 SURGICAL SUPPLY — 40 items
ADH SKN CLS APL DERMABOND .7 (GAUZE/BANDAGES/DRESSINGS) ×1
AGENT HMST SPONGE THK3/8 (HEMOSTASIS)
ARMBAND PINK RESTRICT EXTREMIT (MISCELLANEOUS) ×1 IMPLANT
BAG COUNTER SPONGE SURGICOUNT (BAG) ×1 IMPLANT
BAG SPNG CNTER NS LX DISP (BAG) ×1
CANISTER SUCT 3000ML PPV (MISCELLANEOUS) ×1 IMPLANT
CLIP TI MEDIUM 6 (CLIP) ×1 IMPLANT
CLIP TI WIDE RED SMALL 6 (CLIP) ×1 IMPLANT
COVER PROBE W GEL 5X96 (DRAPES) ×1 IMPLANT
DERMABOND ADVANCED .7 DNX12 (GAUZE/BANDAGES/DRESSINGS) ×1 IMPLANT
ELECT REM PT RETURN 9FT ADLT (ELECTROSURGICAL) ×1
ELECTRODE REM PT RTRN 9FT ADLT (ELECTROSURGICAL) ×1 IMPLANT
GAUZE SPONGE 4X4 12PLY STRL (GAUZE/BANDAGES/DRESSINGS) ×1 IMPLANT
GLOVE BIO SURGEON STRL SZ7.5 (GLOVE) ×1 IMPLANT
GLOVE BIOGEL PI IND STRL 8 (GLOVE) ×1 IMPLANT
GOWN STRL REUS W/ TWL LRG LVL3 (GOWN DISPOSABLE) ×2 IMPLANT
GOWN STRL REUS W/ TWL XL LVL3 (GOWN DISPOSABLE) ×2 IMPLANT
GOWN STRL REUS W/TWL LRG LVL3 (GOWN DISPOSABLE) ×2
GOWN STRL REUS W/TWL XL LVL3 (GOWN DISPOSABLE) ×2
HEMOSTAT SPONGE AVITENE ULTRA (HEMOSTASIS) IMPLANT
KIT BASIN OR (CUSTOM PROCEDURE TRAY) ×1 IMPLANT
KIT TURNOVER KIT B (KITS) ×1 IMPLANT
NS IRRIG 1000ML POUR BTL (IV SOLUTION) ×1 IMPLANT
PACK CV ACCESS (CUSTOM PROCEDURE TRAY) ×1 IMPLANT
PAD ARMBOARD 7.5X6 YLW CONV (MISCELLANEOUS) ×2 IMPLANT
SLING ARM FOAM STRAP LRG (SOFTGOODS) IMPLANT
SLING ARM FOAM STRAP MED (SOFTGOODS) IMPLANT
SPIKE FLUID TRANSFER (MISCELLANEOUS) ×1 IMPLANT
STAPLER VISISTAT 35W (STAPLE) IMPLANT
SUT ETHILON 3 0 PS 1 (SUTURE) IMPLANT
SUT MNCRL AB 4-0 PS2 18 (SUTURE) ×2 IMPLANT
SUT PROLENE 5 0 C 1 24 (SUTURE) IMPLANT
SUT PROLENE 6 0 BV (SUTURE) IMPLANT
SUT PROLENE 7 0 BV 1 (SUTURE) IMPLANT
SUT SILK 0 TIES 10X30 (SUTURE) ×1 IMPLANT
SUT VIC AB 3-0 SH 27 (SUTURE) ×1
SUT VIC AB 3-0 SH 27X BRD (SUTURE) ×1 IMPLANT
TOWEL GREEN STERILE (TOWEL DISPOSABLE) ×1 IMPLANT
UNDERPAD 30X36 HEAVY ABSORB (UNDERPADS AND DIAPERS) ×1 IMPLANT
WATER STERILE IRR 1000ML POUR (IV SOLUTION) ×1 IMPLANT

## 2023-10-08 NOTE — Discharge Instructions (Signed)
[facility]  Discharge Instructions  AV Fistula or Graft Surgery for Dialysis Access  Please refer to the following instructions for your post-procedure care. Your surgeon or physician assistant will discuss any changes with you.  Activity  You may drive the day following your surgery, if you are comfortable and no longer taking prescription pain medication. Resume full activity as the soreness in your incision resolves.  Bathing/Showering  You may shower after you go home. Keep your incision dry for 48 hours. Do not soak in a bathtub, hot tub, or swim until the incision heals completely. You may not shower if you have a hemodialysis catheter.  Incision Care  Clean your incision with mild soap and water after 48 hours. Pat the area dry with a clean towel. You do not need a bandage unless otherwise instructed. Do not apply any ointments or creams to your incision. You may have skin glue on your incision. Do not peel it off. It will come off on its own in about one week. Your arm may swell a bit after surgery. To reduce swelling use pillows to elevate your arm so it is above your heart. Your doctor will tell you if you need to lightly wrap your arm with an ACE bandage.  Diet  Resume your normal diet. There are not special food restrictions following this procedure. In order to heal from your surgery, it is CRITICAL to get adequate nutrition. Your body requires vitamins, minerals, and protein. Vegetables are the best source of vitamins and minerals. Vegetables also provide the perfect balance of protein. Processed food has little nutritional value, so try to avoid this.  Medications  Resume taking all of your medications. If your incision is causing pain, you may take over-the counter pain relievers such as acetaminophen (Tylenol). If you were prescribed a stronger pain medication, please be aware these medications can cause nausea and constipation. Prevent  nausea by taking the medication with a snack or meal. Avoid constipation by drinking plenty of fluids and eating foods with high amount of fiber, such as fruits, vegetables, and grains.  Do not take Tylenol if you are taking prescription pain medications.  Follow up Your surgeon may want to see you in the office following your access surgery. If so, this will be arranged at the time of your surgery.  Please call us immediately for any of the following conditions:  Increased pain, redness, drainage (pus) from your incision site Fever of 101 degrees or higher Severe or worsening pain at your incision site Hand pain or numbness.  Reduce your risk of vascular disease:  Stop smoking. If you would like help, call [facility] at 1-800-QUIT-NOW (807-001-8929) or [place] at (319)873-8206  Manage your cholesterol Maintain a desired weight Control your diabetes Keep your blood pressure down  Dialysis  It will take several weeks to several months for your new dialysis access to be ready for use. Your surgeon will determine when it is okay to use it. Your nephrologist will continue to direct your dialysis. You can continue to use your Permcath until your new access is ready for use.   10/08/2023 Gael Bevels [id number] 1956/08/27  Surgeon(s): Cephus Shelling, MD  Procedure(s): LEFT ARM ARTERIOVENOUS FISTULA REVISION SIDEBRANCH LIGATION   May stick graft immediately   May stick graft on designated area only:   x Do not stick fistula for 8 weeks    If you have any questions, please call the office  at [phone].

## 2023-10-08 NOTE — Anesthesia Procedure Notes (Signed)
Anesthesia Regional Block: Axillary brachial plexus block   Pre-Anesthetic Checklist: , timeout performed,  Correct Patient, Correct Site, Correct Laterality,  Correct Procedure, Correct Position, site marked,  Risks and benefits discussed,  Surgical consent,  Pre-op evaluation,  At surgeon's request and post-op pain management  Laterality: Left  Prep: chloraprep       Needles:  Injection technique: Single-shot  Needle Type: Echogenic Stimulator Needle     Needle Length: 9cm  Needle Gauge: 21     Additional Needles:   Procedures:, nerve stimulator,,, ultrasound used (permanent image in chart),,     Nerve Stimulator or Paresthesia:  Response: [facility], Radial, Median and ulnar responses elicited, 0.5 mA  Additional Responses:   Narrative:  Start time: 10/08/2023 10:55 AM End time: 10/08/2023 11:03 AM Injection made incrementally with aspirations every 5 mL.  Performed by: Personally  Anesthesiologist: Marcene Duos, MD

## 2023-10-08 NOTE — Op Note (Signed)
Date: October 08, 2023  Preoperative diagnosis: Slow to mature left arm AV fistula  Postoperative diagnosis: Same  Procedure: Revision of left arm AV fistula with sidebranch ligation x 2  Surgeon: Dr. Cephus Shelling, MD  Assistant: Loel Dubonnet, PA  Indications: 67 year old female with slow to mature left brachiocephalic AV fistula.  Recent fistulogram showed multiple side branches.  She presents for fistula revision with sidebranch ligation after risks and benefits discussed.    Anesthesia: Regional  Details: Patient was taken to the operating room after informed consent was obtained.  Placed on operative table in the supine position.  After anesthesia was induced the left arm was prepped draped standard sterile fashion.  Antibiotics were given and timeout performed.  I then used sterile ultrasound to evaluate the left arm and found multiple side branches and these were marked.  I made multiple small incisions over the fistula and each of the side branches was dissected out and ligated between 2-0 silk ties and divided.  We irrigated out the incisions and this was closed with 3-0 Vicryl 4-0 Monocryl and Dermabond.  Complication: None  Condition: Stable  Cephus Shelling, MD [facility] [place] Office: [phone]   Cephus Shelling

## 2023-10-08 NOTE — H&P (Signed)
History and Physical Interval Note:  10/08/2023 11:05 AM  Rita [name]  has presented today for surgery, with the diagnosis of ESRD.  The various methods of treatment have been discussed with the patient and family. After consideration of risks, benefits and other options for treatment, the patient has consented to  Procedure(s): LEFT ARM ARTERIOVENOUS FISTULA REVISION (Left) SIDEBRANCH LIGATION (Left) as a surgical intervention.  The patient's history has been reviewed, patient examined, no change in status, stable for surgery.  I have reviewed the patient's chart and labs.  Questions were answered to the patient's satisfaction.    Fistulogram confirms she needs left arm AVF revision with sidebranch ligation.  Cephus Shelling   Office Note        CC:  follow up Requesting Provider:  Gordan Payment., MD   HPI: Rita [name] is a 67 y.o. (November 15, 1956) female who presents for evaluation of left brachiocephalic fistula which was created by Dr. Chestine Spore on 06/19/2023.  She denies steal symptoms in her left hand.  She is dialyzing via right IJ [facility] on a Monday Wednesday Friday schedule in [place].  She believes the incision has completely healed in her left arm.            Past Medical History:  Diagnosis Date   Acute hypoxemic respiratory failure due to COVID-19 (HCC) 12/30/2019   Ambulates with cane      or walker   Anemia      Hx  Blood Transfusion in 04/2020 [facility]   Arthritis     Atrial fibrillation (HCC)     Cancer (HCC)      breast Cancer 35 yrs ago as of 06/17/23, no restrictions per patient for IV/Blood draws   Chronic respiratory failure with hypoxia, on home O2 therapy (HCC)      2L via [place] prn and qhs   CKD (chronic kidney disease), stage III (HCC)      dialysis M-W-F   COVID-19 12/30/2019    resolved   Diabetes mellitus without complication (HCC)      type 2 - diet controlled, no meds.  Patient does not check her CBGs.   Diastolic CHF (HCC)      Dyspnea      on oxygen 2L qhs via [place]   GI bleed      hx   Gout      hx - no current problems as of 06/17/23   Hypertension     Oxygen dependent      qhs and prn - 2L via [place]   Pneumonia      x several many yrs ago per patient on 06/17/23   Sleep apnea      does not use CPAP, uses oxygen 2L via    Thrombocytopenia (HCC)                      Past Surgical History:  Procedure Laterality Date   AV FISTULA PLACEMENT Left 06/19/2023    Procedure: LEFT ARM BRACHIOCEPHALIC ARTERIOVENOUS (AV) FISTULA CREATION;  Surgeon: Cephus Shelling, MD;  Location: [facility] OR;  Service: Vascular;  Laterality: Left;   COLONOSCOPY   02/2020    in [place]   HERNIA REPAIR   02/2019   IR THORACENTESIS ASP PLEURAL SPACE W/IMG GUIDE   06/28/2020   IR THORACENTESIS ASP PLEURAL SPACE W/IMG GUIDE   08/31/2020   IR THORACENTESIS ASP PLEURAL SPACE W/IMG GUIDE   06/27/2022   PRESSURE SENSOR/CARDIOMEMS  N/A 12/26/2020    Procedure: PRESSURE SENSOR/CARDIOMEMS;  Surgeon: Laurey Morale, MD;  Location: [facility] INVASIVE CV LAB;  Service: Cardiovascular;  Laterality: N/A;   RIGHT HEART CATH N/A 09/07/2020    Procedure: RIGHT HEART CATH;  Surgeon: Laurey Morale, MD;  Location: [facility] INVASIVE CV LAB;  Service: Cardiovascular;  Laterality: N/A;   RIGHT HEART CATH N/A 07/02/2022    Procedure: RIGHT HEART CATH;  Surgeon: Laurey Morale, MD;  Location: [facility] INVASIVE CV LAB;  Service: Cardiovascular;  Laterality: N/A;   UPPER GI ENDOSCOPY   02/2020    in [facility]           Social History             Socioeconomic History   Marital status: Married      Spouse name: Not on file   Number of children: Not on file   Years of education: Not on file   Highest education level: Not on file  Occupational History   Not on file  Tobacco Use   Smoking status: Never   Smokeless tobacco: Never  Vaping Use   Vaping status: Never Used  Substance and Sexual Activity   Alcohol use: Never   Drug use: Never   Sexual activity: Not Currently       Birth control/protection: Post-menopausal  Other Topics Concern   Not on file  Social History Narrative   Not on file    Social Determinants of Health           Financial Resource Strain: Not on file  Food Insecurity: Low Risk  (08/26/2023)    Received from [facility]    Hunger Vital Sign     Worried About Running Out of Food in the Last Year: Never true     Ran Out of Food in the Last Year: Never true  Transportation Needs: No Transportation Needs (08/26/2023)    Received from [employer]     In the past 12 months, has lack of reliable transportation kept you from medical appointments, meetings, work or from getting things needed for daily living? : No  Physical Activity: Not on file  Stress: Not on file  Social Connections: Unknown (04/23/2022)    Received from [facility]    Social Network     Social Network: Not on file  Intimate Partner Violence: Unknown (03/15/2022)    Received from [facility], [facility]    HITS     Physically Hurt: Not on file     Insult or Talk Down To: Not on file     Threaten Physical Harm: Not on file     Scream or Curse: Not on file               Family History  Problem Relation Age of Onset   Cancer Mother                        Current Outpatient Medications  Medication Sig Dispense Refill   acetaminophen (TYLENOL) 650 MG CR tablet Take 1,300 mg by mouth every 8 (eight) hours as needed for pain.       allopurinol (ZYLOPRIM) 100 MG tablet Take 100 mg by mouth 2 (two) times daily.       aspirin EC 81 MG tablet Take 81 mg by mouth daily. Swallow whole.       blood glucose meter kit and supplies Dispense based on patient  and insurance preference. Use up to four times daily as directed. (FOR ICD-10 E10.9, E11.9). 1 each 0   Blood Glucose Monitoring Suppl (FIFTY50 GLUCOSE METER 2.0) w/Device KIT See admin instructions. (Patient not taking: Reported on 08/05/2023)       carvedilol (COREG) 6.25 MG  tablet TAKE 1 TABLET(6.25 MG) BY MOUTH TWICE DAILY 180 tablet 1   ferrous sulfate 324 MG TBEC Take 324 mg by mouth daily with breakfast.       HYDROcodone-acetaminophen (NORCO) 5-325 MG tablet Take 1 tablet by mouth every 6 (six) hours as needed for moderate pain. (Patient not taking: Reported on 08/05/2023) 10 tablet 0   Menthol-Methyl Salicylate (MUSCLE RUB) 10-15 % CREA Apply 1 application topically as needed for muscle pain (knees).        Multiple Vitamin (MULTIVITAMIN WITH MINERALS) TABS tablet Take 1 tablet by mouth daily. Alive gummy bears       OXYGEN Inhale 2 L/min into the lungs at bedtime.       pantoprazole (PROTONIX) 40 MG tablet Take 40 mg by mouth daily.       polyethylene glycol (MIRALAX / GLYCOLAX) 17 g packet Take 17 g by mouth daily as needed for mild constipation or moderate constipation.       torsemide (DEMADEX) 100 MG tablet Take 1 tablet (100 mg total) by mouth 2 (two) times daily. 60 tablet 0      No current facility-administered medications for this visit.        Allergies           Allergies  Allergen Reactions   Codeine Other (See Comments)      Mouth sores    Prednisone Other (See Comments)      Delusions    Propoxyphene Itching   Moxifloxacin Rash   Nsaids Rash          REVIEW OF SYSTEMS:    [X]  denotes positive finding, [ ]  denotes negative finding Cardiac   Comments:  Chest pain or chest pressure:      Shortness of breath upon exertion:      Short of breath when lying flat:      Irregular heart rhythm:             Vascular      Pain in calf, thigh, or hip brought on by ambulation:      Pain in feet at night that wakes you up from your sleep:       Blood clot in your veins:      Leg swelling:              Pulmonary      Oxygen at home:      Productive cough:       Wheezing:              Neurologic      Sudden weakness in arms or legs:       Sudden numbness in arms or legs:       Sudden onset of difficulty speaking or slurred speech:       Temporary loss of vision in one eye:       Problems with dizziness:              Gastrointestinal      Blood in stool:       Vomited blood:              Genitourinary      Burning when urinating:  Blood in urine:             Psychiatric      Major depression:              Hematologic      Bleeding problems:      Problems with blood clotting too easily:             Skin      Rashes or ulcers:             Constitutional      Fever or chills:          PHYSICAL EXAMINATION:        Vitals:    09/23/23 0836  BP: 132/71  Pulse: 67  Resp: 20  Temp: 97.7 F (36.5 C)  SpO2: 95%  Weight: 173 lb 1.6 oz (78.5 kg)  Height: 5\' 1"  (1.549 m)      General:  WDWN in NAD; vital signs documented above Gait: Not observed HENT: WNL, normocephalic Pulmonary: normal non-labored breathing , without Rales, rhonchi,  wheezing Cardiac: regular HR Abdomen: soft, NT, no masses Skin: without rashes Vascular Exam/Pulses: palpable radial pulse Extremities: Palpable thrill near anastomosis; palpable branches with a tortuous cephalic vein Musculoskeletal: no muscle wasting or atrophy       Neurologic: A&O X 3 Psychiatric:  The pt has Normal affect.       ASSESSMENT/PLAN:: 67 y.o. female here for follow up for evaluation of left brachiocephalic fistula   Left brachiocephalic fistula is patent without signs or symptoms of steal syndrome in the left hand Fistula has a palpable thrill in the upper arm however on exam there are multiple side branches.  Cephalic vein is also tortuous in the upper arm.  Prior to cannulating we will further evaluate with a fistulogram in the near future.  I discussed with the patient possible need for branch ligation.  Patient and her husband are in agreement.  We will continue dialyzing via right IJ [facility] for now.  Office will contact her to schedule left arm fistulogram with possible intervention on a nondialysis day in the near future.     Emilie Rutter, PA-C [facility] [phone]   Clinic MD:   Rita Spore

## 2023-10-08 NOTE — Anesthesia Postprocedure Evaluation (Signed)
Anesthesia Post Note  Patient: Rita [name]  Procedure(s) Performed: LEFT ARM ARTERIOVENOUS FISTULA REVISION (Left: Arm Upper) SIDEBRANCH LIGATION (Left: Arm Upper)     Patient location during evaluation: PACU Anesthesia Type: Regional Level of consciousness: awake and alert Pain management: pain level controlled Vital Signs Assessment: post-procedure vital signs reviewed and stable Respiratory status: spontaneous breathing, nonlabored ventilation, respiratory function stable and patient connected to nasal cannula oxygen Cardiovascular status: stable and blood pressure returned to baseline Postop Assessment: no apparent nausea or vomiting Anesthetic complications: no  No notable events documented.  Last Vitals:  Vitals:   10/08/23 1318 10/08/23 1330  BP: (!) 112/52 (!) 121/55  Pulse: 61 64  Resp: (!) 22 14  Temp:    SpO2: 98% 99%    Last Pain:  Vitals:   10/08/23 1303  TempSrc:   PainSc: 0-No pain                 [name]

## 2023-10-08 NOTE — Transfer of Care (Addendum)
Immediate Anesthesia Transfer of Care Note  Patient: Rita [name]  Procedure(s) Performed: LEFT ARM ARTERIOVENOUS FISTULA REVISION (Left: Arm Upper) SIDEBRANCH LIGATION (Left: Arm Upper)  Patient Location: PACU  Anesthesia Type:MAC and Regional  Level of Consciousness: awake and alert   Airway & Oxygen Therapy: Patient Spontanous Breathing  Post-op Assessment: Report given to RN and Post -op Vital signs reviewed and stable  Post vital signs: Reviewed  Last Vitals:  Vitals Value Taken Time  BP 112/52 10/08/23 1318  Temp 36.1 C 10/08/23 1303  Pulse 58 10/08/23 1329  Resp 16 10/08/23 1329  SpO2 100 % 10/08/23 1329  Vitals shown include unfiled device data.  Last Pain:  Vitals:   10/08/23 1303  TempSrc:   PainSc: 0-No pain         Complications: No notable events documented.

## 2023-10-09 ENCOUNTER — Encounter (HOSPITAL_COMMUNITY): Payer: Self-pay | Admitting: Vascular Surgery

## 2023-10-09 ENCOUNTER — Other Ambulatory Visit: Payer: Self-pay

## 2023-10-17 ENCOUNTER — Other Ambulatory Visit: Payer: Self-pay

## 2023-10-17 DIAGNOSIS — N186 End stage renal disease: Secondary | ICD-10-CM

## 2023-11-03 ENCOUNTER — Ambulatory Visit (HOSPITAL_COMMUNITY)
Admission: RE | Admit: 2023-11-03 | Discharge: 2023-11-03 | Disposition: A | Discharge status: Home / Self Care | Payer: PPO | Source: Ambulatory Visit | Attending: Vascular Surgery | Admitting: Vascular Surgery

## 2023-11-03 DIAGNOSIS — N186 End stage renal disease: Secondary | ICD-10-CM | POA: Diagnosis not present

## 2023-11-03 DIAGNOSIS — Z992 Dependence on renal dialysis: Secondary | ICD-10-CM | POA: Insufficient documentation

## 2023-11-11 ENCOUNTER — Ambulatory Visit (INDEPENDENT_AMBULATORY_CARE_PROVIDER_SITE_OTHER): Payer: PPO | Admitting: Physician Assistant

## 2023-11-11 VITALS — BP 138/64 | HR 70 | Temp 98.1°F | Resp 18 | Ht 61.0 in | Wt 175.3 lb

## 2023-11-11 DIAGNOSIS — Z992 Dependence on renal dialysis: Secondary | ICD-10-CM

## 2023-11-11 DIAGNOSIS — N186 End stage renal disease: Secondary | ICD-10-CM

## 2023-11-11 NOTE — Progress Notes (Signed)
POST OPERATIVE OFFICE NOTE    CC:  F/u for surgery  HPI:  Rita [name] is a 67 y.o. female who is here for postop visit.  She has a history of left brachiocephalic AV fistula creation by Dr. Chestine Spore on 06/19/2023.  She then underwent fistulogram on 10/02/2023 which demonstrated no evidence of central venous stenosis.  Given that her fistula was poorly maturing, she required ligation of several side branches.  She just recently underwent ligation of several left brachiocephalic fistula sidebranches on 10/08/2023 by Dr. Chestine Spore.  She returns today for follow-up.  She states her incisions since surgery have healed appropriately without drainage, erythema, or swelling.  She denies any steal symptoms in the left hand.  She is on dialysis on MWF via left IJ [facility].   Allergies  Allergen Reactions   Codeine Other (See Comments)    Mouth sores    Prednisone Other (See Comments)    Delusions    Propoxyphene Itching   Moxifloxacin Rash   Nsaids Rash    Current Outpatient Medications  Medication Sig Dispense Refill   acetaminophen (TYLENOL) 325 MG tablet Take 650 mg by mouth every 6 (six) hours as needed (pain.).     allopurinol (ZYLOPRIM) 100 MG tablet Take 100 mg by mouth 2 (two) times daily.     aspirin EC 81 MG tablet Take 81 mg by mouth daily after breakfast. Swallow whole.     blood glucose meter kit and supplies Dispense based on patient and insurance preference. Use up to four times daily as directed. (FOR ICD-10 E10.9, E11.9). 1 each 0   Blood Glucose Monitoring Suppl (FIFTY50 GLUCOSE METER 2.0) w/Device KIT See admin instructions. (Patient not taking: Reported on 08/05/2023)     carvedilol (COREG) 6.25 MG tablet TAKE 1 TABLET(6.25 MG) BY MOUTH TWICE DAILY (Patient taking differently: Take 6.25 mg by mouth See admin instructions. Take 1 tablet (6.25 mg) by mouth on Tuesdays, Thursdays & Saturdays in the morning.) 180 tablet 1   HYDROcodone-acetaminophen (NORCO/VICODIN) 5-325 MG tablet Take 1  tablet by mouth every 4 (four) hours as needed for moderate pain (pain score 4-6) or severe pain (pain score 7-10). 7 tablet 0   Menthol-Methyl Salicylate (MUSCLE RUB) 10-15 % CREA Apply 1 application topically as needed for muscle pain (knees).      OXYGEN Inhale 2 L/min into the lungs at bedtime.     pantoprazole (PROTONIX) 40 MG tablet Take 40 mg by mouth daily after breakfast.     polyethylene glycol (MIRALAX / GLYCOLAX) 17 g packet Take 17 g by mouth daily as needed for mild constipation or moderate constipation.     torsemide (DEMADEX) 20 MG tablet Take 100 mg by mouth 2 (two) times daily.     Current Facility-Administered Medications  Medication Dose Route Frequency Provider Last Rate Last Admin   sodium chloride flush (NS) 0.9 % injection 3 mL  3 mL Intravenous Q12H Cephus Shelling, MD       sodium chloride flush (NS) 0.9 % injection 3 mL  3 mL Intravenous PRN Cephus Shelling, MD         ROS:  See HPI  Physical Exam:   Incision: Left upper extremity incisions well-healed without erythema or drainage Extremities: Palpable left radial pulse.  Left brachiocephalic fistula still fairly small on appearance.  The fistula is tortuous throughout the upper arm with an average thrill Neuro: Intact motor and sensation of left upper extremity  Dialysis Duplex (11/03/2023)  +--------------------+----------+-----------------+--------+  AVF  PSV (cm/s)Flow Vol (mL/min)Comments  +--------------------+----------+-----------------+--------+  Native artery inflow   204           429                 +--------------------+----------+-----------------+--------+  AVF Anastomosis        300                               +--------------------+----------+-----------------+--------+     +------------+----------+------------+----------+--------------------------  ----+  OUTFLOW VEINPSV (cm/s)  Diameter  Depth (cm)           Describe                                         (cm)                                               +------------+----------+------------+----------+--------------------------  ----+  Prox UA        123        0.63       0.82                                    +------------+----------+------------+----------+--------------------------  ----+  Mid UA         147        0.53       0.21                                    +------------+----------+------------+----------+--------------------------  ----+  Dist UA        173        0.67       0.21                                    +------------+----------+------------+----------+--------------------------  ----+  AC Fossa       733        0.34       0.22       change in Diameter  and                                                             stenotic              +------------+----------+------------+----------+--------------------------  ----+     Assessment/Plan:  This is a 67 y.o. female who is here for postop visit  -The patient recently underwent sidebranch ligation for a left brachiocephalic AV fistula to assist in maturation.  -The patient's left upper extremity incisions are well-healed without signs of infection.  She has a palpable left radial pulse and no symptoms of steal -On exam her fistula is still fairly small and tortuous in the upper arm.  It has a moderate thrill on palpation -Duplex demonstrates that the patient's fistula is still slow to mature.  Flow volumes are less than 600.  Diameters in the [facility] fossa are less than 6 mm.  This likely would not be able to support dialysis at this time -I have discussed this case with Dr. Chestine Spore.  At this point the patient has 2 options going forward: We can attempt a final left upper extremity fistulogram and possibly balloon the fistula near the [facility] fossa to assist in its maturation.  The other option is to ligate the fistula and create a left upper arm graft.  At this time  tentatively the patient would like to attempt 1 more fistulogram. -We can schedule for a left upper extremity fistulogram on a Tuesday or Thursday with Dr. Chestine Spore.  This can be scheduled sometime in January 2025.  The patient would like to wait until after the holidays for her procedure.   Loel Dubonnet, PA-C [facility] [phone]   Clinic MD:  Lenell Antu

## 2023-11-12 ENCOUNTER — Other Ambulatory Visit: Payer: Self-pay

## 2023-11-12 DIAGNOSIS — N186 End stage renal disease: Secondary | ICD-10-CM

## 2023-12-18 ENCOUNTER — Other Ambulatory Visit: Payer: Self-pay

## 2023-12-18 ENCOUNTER — Ambulatory Visit (HOSPITAL_COMMUNITY)
Admission: RE | Disposition: A | Discharge status: Home / Self Care | Payer: Self-pay | Source: Home / Self Care | Attending: Vascular Surgery

## 2023-12-18 ENCOUNTER — Ambulatory Visit (HOSPITAL_COMMUNITY)
Admission: RE | Admit: 2023-12-18 | Discharge: 2023-12-18 | Disposition: A | Discharge status: Home / Self Care | Payer: PPO | Attending: Vascular Surgery | Admitting: Vascular Surgery

## 2023-12-18 DIAGNOSIS — N185 Chronic kidney disease, stage 5: Secondary | ICD-10-CM

## 2023-12-18 DIAGNOSIS — Y832 Surgical operation with anastomosis, bypass or graft as the cause of abnormal reaction of the patient, or of later complication, without mention of misadventure at the time of the procedure: Secondary | ICD-10-CM | POA: Insufficient documentation

## 2023-12-18 DIAGNOSIS — T82858A Stenosis of vascular prosthetic devices, implants and grafts, initial encounter: Secondary | ICD-10-CM | POA: Insufficient documentation

## 2023-12-18 DIAGNOSIS — T82898A Other specified complication of vascular prosthetic devices, implants and grafts, initial encounter: Secondary | ICD-10-CM | POA: Diagnosis not present

## 2023-12-18 DIAGNOSIS — N186 End stage renal disease: Secondary | ICD-10-CM | POA: Insufficient documentation

## 2023-12-18 DIAGNOSIS — Z992 Dependence on renal dialysis: Secondary | ICD-10-CM | POA: Diagnosis not present

## 2023-12-18 HISTORY — PX: PERIPHERAL VASCULAR BALLOON ANGIOPLASTY: CATH118281

## 2023-12-18 HISTORY — PX: A/V FISTULAGRAM: CATH118298

## 2023-12-18 LAB — POCT I-STAT, CHEM 8
BUN: 28 mg/dL — ABNORMAL HIGH (ref 8–23)
Calcium, Ion: 1.1 mmol/L — ABNORMAL LOW (ref 1.15–1.40)
Chloride: 100 mmol/L (ref 98–111)
Creatinine, Ser: 5.1 mg/dL — ABNORMAL HIGH (ref 0.44–1.00)
Glucose, Bld: 114 mg/dL — ABNORMAL HIGH (ref 70–99)
HCT: 41 % (ref 36.0–46.0)
Hemoglobin: 13.9 g/dL (ref 12.0–15.0)
Potassium: 4.2 mmol/L (ref 3.5–5.1)
Sodium: 137 mmol/L (ref 135–145)
TCO2: 29 mmol/L (ref 22–32)

## 2023-12-18 LAB — GLUCOSE, CAPILLARY: Glucose-Capillary: 204 mg/dL — ABNORMAL HIGH (ref 70–99)

## 2023-12-18 SURGERY — A/V FISTULAGRAM
Anesthesia: LOCAL | Laterality: Left

## 2023-12-18 MED ORDER — IODIXANOL 320 MG/ML IV SOLN
INTRAVENOUS | Status: DC | PRN
  Administered 2023-12-18: 35 mL

## 2023-12-18 MED ORDER — LIDOCAINE HCL (PF) 1 % IJ SOLN
INTRAMUSCULAR | Status: AC
  Filled 2023-12-18: qty 30

## 2023-12-18 MED ORDER — LABETALOL HCL 5 MG/ML IV SOLN: 10.0000 mg | INTRAVENOUS | Status: DC | PRN

## 2023-12-18 MED ORDER — SODIUM CHLORIDE 0.9 % IV SOLN: 250.0000 mL | INTRAVENOUS | Status: DC | PRN

## 2023-12-18 MED ORDER — VERAPAMIL HCL 2.5 MG/ML IV SOLN
INTRAVENOUS | Status: AC
  Filled 2023-12-18: qty 2

## 2023-12-18 MED ORDER — ONDANSETRON HCL 4 MG/2ML IJ SOLN
4.0000 mg | Freq: Four times a day (QID) | INTRAMUSCULAR | Status: DC | PRN
Start: 2023-12-18 — End: 2023-12-18

## 2023-12-18 MED ORDER — LIDOCAINE HCL (PF) 1 % IJ SOLN
INTRAMUSCULAR | Status: DC | PRN
  Administered 2023-12-18: 2 mL via INTRADERMAL

## 2023-12-18 MED ORDER — SODIUM CHLORIDE 0.9% FLUSH: 3.0000 mL | Freq: Two times a day (BID) | INTRAVENOUS | Status: DC

## 2023-12-18 MED ORDER — HEPARIN (PORCINE) IN NACL 1000-0.9 UT/500ML-% IV SOLN
INTRAVENOUS | Status: DC | PRN
  Administered 2023-12-18: 500 mL

## 2023-12-18 MED ORDER — HEPARIN SODIUM (PORCINE) 1000 UNIT/ML IJ SOLN
INTRAMUSCULAR | Status: AC
  Filled 2023-12-18: qty 10

## 2023-12-18 MED ORDER — ACETAMINOPHEN 325 MG PO TABS: 650.0000 mg | ORAL_TABLET | ORAL | Status: DC | PRN

## 2023-12-18 MED ORDER — HYDRALAZINE HCL 20 MG/ML IJ SOLN: 5.0000 mg | INTRAMUSCULAR | Status: DC | PRN

## 2023-12-18 MED ORDER — SODIUM CHLORIDE 0.9% FLUSH: 3.0000 mL | INTRAVENOUS | Status: DC | PRN

## 2023-12-18 SURGICAL SUPPLY — 19 items
BAG SNAP BAND KOVER 36X36 (MISCELLANEOUS) ×2 IMPLANT
BALLN MUSTANG 6.0X40 75 (BALLOONS) ×2
BALLOON MUSTANG 6.0X40 75 (BALLOONS) IMPLANT
CATH ANGIO 5F BER2 65CM (CATHETERS) IMPLANT
COVER DOME SNAP 22 D (MISCELLANEOUS) ×2 IMPLANT
DCB RANGER 6.0X40 135 (BALLOONS) IMPLANT
DEVICE RAD COMP TR BAND LRG (VASCULAR PRODUCTS) IMPLANT
KIT ENCORE 26 ADVANTAGE (KITS) IMPLANT
PROTECTION STATION PRESSURIZED (MISCELLANEOUS) ×2
RANGER DCB 6.0X40 135 (BALLOONS) ×2
SHEATH GLIDE SLENDER 4/5FR (SHEATH) IMPLANT
SHEATH PROBE COVER 6X72 (BAG) ×2 IMPLANT
STATION PROTECTION PRESSURIZED (MISCELLANEOUS) ×2 IMPLANT
STOPCOCK MORSE 400PSI 3WAY (MISCELLANEOUS) ×2 IMPLANT
TRAY PV CATH (CUSTOM PROCEDURE TRAY) ×2 IMPLANT
TUBING CIL FLEX 10 FLL-RA (TUBING) ×2 IMPLANT
WIRE BENTSON .035X145CM (WIRE) IMPLANT
WIRE G V18X300CM (WIRE) IMPLANT
WIRE ROSEN-J .035X260CM (WIRE) IMPLANT

## 2023-12-18 NOTE — H&P (Signed)
 History and Physical Interval Note:  12/18/2023 8:35 AM  Rita [name]  has presented today for surgery, with the diagnosis of instagee renal.  The various methods of treatment have been discussed with the patient and family. After consideration of risks, benefits and other options for treatment, the patient has consented to  Procedure(s): A/V Fistulagram (Left) as a surgical intervention.  The patient's history has been reviewed, patient examined, no change in status, stable for surgery.  I have reviewed the patient's chart and labs.  Questions were answered to the patient's satisfaction.    Left arm fistulogram.  Rita [name]   POST OPERATIVE OFFICE NOTE       CC:  F/u for surgery   HPI:  Rita [name] is a 68 y.o. female who is here for postop visit.  She has a history of left brachiocephalic AV fistula creation by Dr. Gaskins on 06/19/2023.  She then underwent fistulogram on 10/02/2023 which demonstrated no evidence of central venous stenosis.  Given that her fistula was poorly maturing, she required ligation of several side branches.  She just recently underwent ligation of several left brachiocephalic fistula sidebranches on 10/08/2023 by Dr. Gaskins.   She returns today for follow-up.  She states her incisions since surgery have healed appropriately without drainage, erythema, or swelling.  She denies any steal symptoms in the left hand.   She is on dialysis on MWF via left IJ TDC.     Allergies       Allergies  Allergen Reactions   Codeine Other (See Comments)      Mouth sores    Prednisone Other (See Comments)      Delusions    Propoxyphene Itching   Moxifloxacin Rash   Nsaids Rash              Current Outpatient Medications  Medication Sig Dispense Refill   acetaminophen  (TYLENOL ) 325 MG tablet Take 650 mg by mouth every 6 (six) hours as needed (pain.).       allopurinol  (ZYLOPRIM ) 100 MG tablet Take 100 mg by mouth 2 (two) times daily.        aspirin  EC 81 MG tablet Take 81 mg by mouth daily after breakfast. Swallow whole.       blood glucose meter kit and supplies Dispense based on patient and insurance preference. Use up to four times daily as directed. (FOR ICD-10 E10.9, E11.9). 1 each 0   Blood Glucose Monitoring Suppl (FIFTY50 GLUCOSE METER 2.0) w/Device KIT See admin instructions. (Patient not taking: Reported on 08/05/2023)       carvedilol  (COREG ) 6.25 MG tablet TAKE 1 TABLET(6.25 MG) BY MOUTH TWICE DAILY (Patient taking differently: Take 6.25 mg by mouth See admin instructions. Take 1 tablet (6.25 mg) by mouth on Tuesdays, Thursdays & Saturdays in the morning.) 180 tablet 1   HYDROcodone -acetaminophen  (NORCO/VICODIN) 5-325 MG tablet Take 1 tablet by mouth every 4 (four) hours as needed for moderate pain (pain score 4-6) or severe pain (pain score 7-10). 7 tablet 0   Menthol-Methyl Salicylate (MUSCLE RUB) 10-15 % CREA Apply 1 application topically as needed for muscle pain (knees).        OXYGEN  Inhale 2 L/min into the lungs at bedtime.       pantoprazole  (PROTONIX ) 40 MG tablet Take 40 mg by mouth daily after breakfast.       polyethylene glycol (MIRALAX  / GLYCOLAX ) 17 g packet Take 17 g by mouth daily as needed for mild constipation or  moderate constipation.       torsemide  (DEMADEX ) 20 MG tablet Take 100 mg by mouth 2 (two) times daily.                   Current Facility-Administered Medications  Medication Dose Route Frequency Provider Last Rate Last Admin   sodium chloride  flush (NS) 0.9 % injection 3 mL  3 mL Intravenous Q12H Gretta Rita PARAS, MD       sodium chloride  flush (NS) 0.9 % injection 3 mL  3 mL Intravenous PRN Gretta Rita PARAS, MD             ROS:  See HPI   Physical Exam:     Incision: Left upper extremity incisions well-healed without erythema or drainage Extremities: Palpable left radial pulse.  Left brachiocephalic fistula still fairly small on appearance.  The fistula is tortuous throughout  the upper arm with an average thrill Neuro: Intact motor and sensation of left upper extremity   Dialysis Duplex (11/03/2023)  +--------------------+----------+-----------------+--------+  AVF                PSV (cm/s)Flow Vol (mL/min)Comments  +--------------------+----------+-----------------+--------+  Native artery inflow   204           429                 +--------------------+----------+-----------------+--------+  AVF Anastomosis        300                               +--------------------+----------+-----------------+--------+     +------------+----------+------------+----------+--------------------------  ----+  OUTFLOW VEINPSV (cm/s)  Diameter  Depth (cm)           Describe                                        (cm)                                               +------------+----------+------------+----------+--------------------------  ----+  Prox UA        123        0.63       0.82                                    +------------+----------+------------+----------+--------------------------  ----+  Mid UA         147        0.53       0.21                                    +------------+----------+------------+----------+--------------------------  ----+  Dist UA        173        0.67       0.21                                    +------------+----------+------------+----------+--------------------------  ----+  AC Fossa       733  0.34       0.22       change in Diameter  and                                                             stenotic              +------------+----------+------------+----------+--------------------------  ----+        Assessment/Plan:  This is a 68 y.o. female who is here for postop visit   -The patient recently underwent sidebranch ligation for a left brachiocephalic AV fistula to assist in maturation.  -The patient's left upper extremity incisions are  well-healed without signs of infection.  She has a palpable left radial pulse and no symptoms of steal -On exam her fistula is still fairly small and tortuous in the upper arm.  It has a moderate thrill on palpation -Duplex demonstrates that the patient's fistula is still slow to mature. Flow volumes are less than 600.  Diameters in the [facility] fossa are less than 6 mm.  This likely would not be able to support dialysis at this time -I have discussed this case with Dr. Gretta.  At this point the patient has 2 options going forward: We can attempt a final left upper extremity fistulogram and possibly balloon the fistula near the [facility] fossa to assist in its maturation.  The other option is to ligate the fistula and create a left upper arm graft.  At this time tentatively the patient would like to attempt 1 more fistulogram. -We can schedule for a left upper extremity fistulogram on a Tuesday or Thursday with Dr. Gretta.  This can be scheduled sometime in January 2025.  The patient would like to wait until after the holidays for her procedure.     Ahmed Holster, PA-C [facility] [phone]     Clinic MD:  [place]

## 2023-12-18 NOTE — Op Note (Signed)
    Patient name: Rita [name] MRN: [id number] DOB: 07/13/1956 Sex: female  12/18/2023 Pre-operative Diagnosis: Slow to mature left brachiocephalic AV fistula Post-operative diagnosis:  Same Surgeon:  Lonni DOROTHA Gaskins, MD Procedure Performed: 1.  Ultrasound-guided access left radial artery 2.  Left upper extremity fistulogram including central venogram 3.  Angioplasty of left brachiocephalic fistula anastomosis and cephalic vein in the distal upper arm adjacent to the arterial anastomosis (6 mm Mustang and 6 mm drug coated Ranger)   Indications: 68 year old female with end-stage renal disease that has a left brachiocephalic AV fistula placed on 2/88/7975.  This has been slow to mature and she has had sidebranch ligation and previous fistulogram.  She now presents for intervention on the fistula adjacent to the arterial anastomosis after recent duplex findings and risks benefits discussed.  Findings:   Ultrasound-guided access left radial artery.  Left upper extremity fistulogram was obtained showing a 70% stenosis of the cephalic vein adjacent to the arterial anastomosis where the vein is small in caliber.  This was crossed and then treated with a 6 mm Mustang including the arterial anastomosis and cephalic vein in the distal upper arm adjacent to the arterial anastomosis.  I then treated this with a 6 mm drug-coated Ranger for 3 minutes.  Widely patent fistula at completion.  Much better thrill.    Procedure:  The patient was identified in the holding area and taken to room 8.  The patient was then placed supine on the table and prepped and draped in the usual sterile fashion.  A time out was called.  Ultrasound was used to evaluate the left radial artery, it was patent, an image was saved.  The left radial artery was then accessed under ultrasound guidance with micro access needle and placed a microwire and then a 5 French slender sheath.  The patient was then given the radial  cocktail.  I then used a Bentson wire and a KMP catheter and got a left upper extremity fistulogram including central venogram.  As demonstrated on duplex there was 70% stenosis of the cephalic vein adjacent to the arterial anastomosis where the vein was small.  The patient was given additional 3,000 units IV heparin . I then crossed the arterial anastomosis and got my wire into the left upper arm fistula.  I then treated the cephalic vein in the distal upper arm including the anastomosis with a 6 mm Mustang to nominal pressure for 2 minutes.  I then changed for a V18 wire and then used a 6 mm drug-coated Ranger for 3 minutes.  There was no evidence of significant residual stenosis.  Much better thrill.  Wires and catheters were removed.  A radial TR band was placed.  Plan: Discussed she can use the fistula immediately.  Will arrange follow-up in 1 month with fistula duplex.  Lonni DOROTHA Gaskins, MD [facility] [place] Office: [phone]

## 2023-12-19 ENCOUNTER — Encounter (HOSPITAL_COMMUNITY): Payer: Self-pay | Admitting: Vascular Surgery

## 2023-12-19 MED FILL — Verapamil HCl IV Soln 2.5 MG/ML: INTRAVENOUS | Qty: 2 | Status: AC

## 2024-01-16 ENCOUNTER — Other Ambulatory Visit: Payer: Self-pay

## 2024-01-16 DIAGNOSIS — N186 End stage renal disease: Secondary | ICD-10-CM

## 2024-01-29 ENCOUNTER — Ambulatory Visit (HOSPITAL_COMMUNITY): Payer: PPO

## 2024-03-09 ENCOUNTER — Ambulatory Visit (HOSPITAL_COMMUNITY)
Admission: RE | Admit: 2024-03-09 | Discharge: 2024-03-09 | Disposition: A | Discharge status: Home / Self Care | Source: Ambulatory Visit | Attending: Vascular Surgery | Admitting: Vascular Surgery

## 2024-03-09 ENCOUNTER — Ambulatory Visit: Admitting: Physician Assistant

## 2024-03-09 VITALS — BP 115/70 | HR 67 | Temp 97.8°F | Resp 18 | Ht 61.0 in | Wt 185.5 lb

## 2024-03-09 DIAGNOSIS — N186 End stage renal disease: Secondary | ICD-10-CM

## 2024-03-09 DIAGNOSIS — Z992 Dependence on renal dialysis: Secondary | ICD-10-CM | POA: Insufficient documentation

## 2024-03-09 NOTE — Progress Notes (Signed)
 POST OPERATIVE OFFICE NOTE    CC:  F/u for surgery  HPI:  This is a 68 y.o. female who is ESRD  that has a left brachiocephalic AV fistula placed on 2/84/1324.  This has been slow to mature and she has had sidebranch ligation and previous fistulogram.  Most recent intervention includes angioplasty fistula anastomosis and cephalic vein in the distal upper arm adjacent to the arterial anastomosis.  On the angiogram there was a 70% stenosis that was treated with balloon angioplasty.  There was a reported much better thrill.  She currently dialysis via [facility].  She denies symptoms of steal, no pain , loss of motor or loss of sensation.       Allergies  Allergen Reactions   Codeine Other (See Comments)    Mouth sores    Prednisone Other (See Comments)    Delusions    Propoxyphene Itching   Moxifloxacin Rash   Nsaids Rash    Current Outpatient Medications  Medication Sig Dispense Refill   acetaminophen (TYLENOL) 325 MG tablet Take 650 mg by mouth every 6 (six) hours as needed (pain.).     allopurinol (ZYLOPRIM) 100 MG tablet Take 100 mg by mouth 2 (two) times daily.     aspirin EC 81 MG tablet Take 81 mg by mouth daily after breakfast. Swallow whole.     blood glucose meter kit and supplies Dispense based on patient and insurance preference. Use up to four times daily as directed. (FOR ICD-10 E10.9, E11.9). 1 each 0   Blood Glucose Monitoring Suppl (FIFTY50 GLUCOSE METER 2.0) w/Device KIT See admin instructions.     carvedilol (COREG) 6.25 MG tablet TAKE 1 TABLET(6.25 MG) BY MOUTH TWICE DAILY (Patient taking differently: Take 6.25 mg by mouth See admin instructions. Take 1 tablet (6.25 mg) by mouth on Tuesdays, Thursdays Saturdays, & Sundays twice a day.) 180 tablet 1   HYDROcodone-acetaminophen (NORCO/VICODIN) 5-325 MG tablet Take 1 tablet by mouth every 4 (four) hours as needed for moderate pain (pain score 4-6) or severe pain (pain score 7-10). 7 tablet 0   Menthol-Methyl Salicylate  (MUSCLE RUB) 10-15 % CREA Apply 1 application topically as needed for muscle pain (knees).      OXYGEN Inhale 2 L/min into the lungs at bedtime.     pantoprazole (PROTONIX) 40 MG tablet Take 40 mg by mouth daily after breakfast.     polyethylene glycol (MIRALAX / GLYCOLAX) 17 g packet Take 17 g by mouth daily as needed for mild constipation or moderate constipation.     torsemide (DEMADEX) 100 MG tablet Take 100 mg by mouth every Tuesday, Thursday, Saturday, and Sunday.     No current facility-administered medications for this visit.     ROS:  See HPI  Physical Exam:    Incision:  well healed Extremities:  easily palpable thrill in the fistula Neuro: hand has intact motor and sensation with 5/5 grip equal B UE  Findings:  +--------------------+----------+-----------------+--------+  AVF                PSV (cm/s)Flow Vol (mL/min)Comments  +--------------------+----------+-----------------+--------+  Native artery inflow   202           618                 +--------------------+----------+-----------------+--------+  AVF Anastomosis        38 6                               +--------------------+----------+-----------------+--------+     +------------+----------+-------------+----------+----------------+  OUTFLOW VEINPSV (cm/s)Diameter (cm)Depth (cm)    Describe      +------------+----------+-------------+----------+----------------+  Shoulder       88        0.70        1.02                     +------------+----------+-------------+----------+----------------+  Prox UA        104        0.69        0.80                     +------------+----------+-------------+----------+----------------+  Mid UA         158        0.68        0.20   competing branch  +------------+----------+-------------+----------+----------------+  Dist UA        125        0.85        0.14                      +------------+----------+-------------+----------+----------------+  AC Fossa       351        0.29        0.23                     +------------+----------+-------------+----------+----------------+        Summary:  Patent arteriovenous fistula.   One competing branch observed.  Cephalic outflow vein is tortuous throughout the mid to distal upper arm.    Assessment/Plan:  This is a 68 y.o. female who is s/p:fistulogram with  Angioplasty of left brachiocephalic fistula anastomosis and cephalic vein in the distal upper arm adjacent to the arterial anastomosis (6 mm Mustang and 6 mm drug coated Ranger) The fistula has improved flow > 600 and good palpable thrill.    At this point the fistula is now mature and may be accessed.  It may be used as of Monday  03/15/24.  If this fail to be used we will have to abandon it and start new access.    Mosetta Pigeon PA-C [facility] [phone]   Clinic MD:  Lenell Antu

## 2024-05-19 ENCOUNTER — Ambulatory Visit (HOSPITAL_COMMUNITY)
Admission: RE | Admit: 2024-05-19 | Discharge: 2024-05-19 | Disposition: A | Discharge status: Home / Self Care | Attending: Vascular Surgery | Admitting: Vascular Surgery

## 2024-05-19 ENCOUNTER — Other Ambulatory Visit: Payer: Self-pay

## 2024-05-19 ENCOUNTER — Encounter (HOSPITAL_COMMUNITY)
Admission: RE | Disposition: A | Discharge status: Home / Self Care | Payer: Self-pay | Source: Home / Self Care | Attending: Vascular Surgery

## 2024-05-19 DIAGNOSIS — Z992 Dependence on renal dialysis: Secondary | ICD-10-CM | POA: Diagnosis not present

## 2024-05-19 DIAGNOSIS — T82898A Other specified complication of vascular prosthetic devices, implants and grafts, initial encounter: Secondary | ICD-10-CM

## 2024-05-19 DIAGNOSIS — E1122 Type 2 diabetes mellitus with diabetic chronic kidney disease: Secondary | ICD-10-CM | POA: Insufficient documentation

## 2024-05-19 DIAGNOSIS — N186 End stage renal disease: Secondary | ICD-10-CM

## 2024-05-19 DIAGNOSIS — I132 Hypertensive heart and chronic kidney disease with heart failure and with stage 5 chronic kidney disease, or end stage renal disease: Secondary | ICD-10-CM | POA: Insufficient documentation

## 2024-05-19 DIAGNOSIS — I5032 Chronic diastolic (congestive) heart failure: Secondary | ICD-10-CM | POA: Diagnosis not present

## 2024-05-19 DIAGNOSIS — T82858A Stenosis of vascular prosthetic devices, implants and grafts, initial encounter: Secondary | ICD-10-CM | POA: Diagnosis present

## 2024-05-19 DIAGNOSIS — N179 Acute kidney failure, unspecified: Secondary | ICD-10-CM

## 2024-05-19 DIAGNOSIS — Y832 Surgical operation with anastomosis, bypass or graft as the cause of abnormal reaction of the patient, or of later complication, without mention of misadventure at the time of the procedure: Secondary | ICD-10-CM | POA: Insufficient documentation

## 2024-05-19 DIAGNOSIS — N189 Chronic kidney disease, unspecified: Secondary | ICD-10-CM

## 2024-05-19 SURGERY — A/V SHUNT INTERVENTION
Anesthesia: LOCAL

## 2024-05-19 MED ORDER — HEPARIN SODIUM (PORCINE) 1000 UNIT/ML IJ SOLN
INTRAMUSCULAR | Status: DC | PRN
  Administered 2024-05-19: 5000 [IU] via INTRAVENOUS

## 2024-05-19 MED ORDER — LIDOCAINE HCL (PF) 1 % IJ SOLN
INTRAMUSCULAR | Status: DC | PRN
  Administered 2024-05-19: 7 mL

## 2024-05-19 MED ORDER — HEPARIN SODIUM (PORCINE) 1000 UNIT/ML IJ SOLN
INTRAMUSCULAR | Status: AC
  Filled 2024-05-19: qty 10

## 2024-05-19 MED ORDER — LIDOCAINE HCL (PF) 1 % IJ SOLN
INTRAMUSCULAR | Status: AC
  Filled 2024-05-19: qty 30

## 2024-05-19 MED ORDER — IODIXANOL 320 MG/ML IV SOLN
INTRAVENOUS | Status: DC | PRN
  Administered 2024-05-19: 60 mL via INTRAVENOUS

## 2024-05-19 MED ORDER — HEPARIN (PORCINE) IN NACL 1000-0.9 UT/500ML-% IV SOLN
INTRAVENOUS | Status: DC | PRN
  Administered 2024-05-19: 500 mL

## 2024-05-19 SURGICAL SUPPLY — 9 items
BALLOON MUSTANG 10X80X75 (BALLOONS) IMPLANT
BALLOON MUSTANG 6.0X40 75 (BALLOONS) IMPLANT
GUIDEWIRE ANGLED .035 180CM (WIRE) IMPLANT
KIT ENCORE 26 ADVANTAGE (KITS) IMPLANT
KIT MICROPUNCTURE NIT STIFF (SHEATH) IMPLANT
SHEATH PINNACLE R/O II 6F 4CM (SHEATH) IMPLANT
SHEATH PROBE COVER 6X72 (BAG) IMPLANT
TRAY PV CATH (CUSTOM PROCEDURE TRAY) ×2 IMPLANT
TUBING CIL FLEX 10 FLL-RA (TUBING) IMPLANT

## 2024-05-19 NOTE — H&P (Signed)
 HD ACCESS CENTER H&P   Patient ID: Rita [name], female   DOB: 1956/03/13, 68 y.o.   MRN: [id number]  Subjective:     HPI Rita [name] is a 68 y.o. female with ESRD presenting to the HD access center for intervention.  Past Medical History:  Diagnosis Date   Acute hypoxemic respiratory failure due to COVID-19 (HCC) 12/30/2019   Ambulates with cane    or walker   Anemia    Hx  Blood Transfusion in 04/2020 [facility]   Arthritis    Atrial fibrillation (HCC)    Cancer (HCC)    breast Cancer 35 yrs ago as of 06/17/23, no restrictions per patient for IV/Blood draws   Chronic respiratory failure with hypoxia, on home O2 therapy (HCC)    2L via [place] prn and qhs   CKD (chronic kidney disease), stage III (HCC)    dialysis M-W-F   COVID-19 12/30/2019   resolved   Diabetes mellitus without complication (HCC)    type 2 - diet controlled, no meds.  Patient does not check her CBGs.   Diastolic CHF (HCC)    Dyspnea    on oxygen  2L qhs via [place]   GI bleed    hx   Gout    hx - no current problems as of 06/17/23   Hypertension    Oxygen  dependent    qhs and prn - 2L via [place]   Pneumonia    x several many yrs ago per patient on 06/17/23   Sleep apnea    does not use CPAP, uses oxygen  2L via    Thrombocytopenia (HCC)    Family History  Problem Relation Age of Onset   Cancer Mother    Past Surgical History:  Procedure Laterality Date   A/V FISTULAGRAM Left 10/02/2023   Procedure: A/V Fistulagram;  Surgeon: Young Hensen, MD;  Location: [facility] INVASIVE CV LAB;  Service: Cardiovascular;  Laterality: Left;   A/V FISTULAGRAM Left 12/18/2023   Procedure: A/V Fistulagram;  Surgeon: Young Hensen, MD;  Location: [facility] INVASIVE CV LAB;  Service: Cardiovascular;  Laterality: Left;   AV FISTULA PLACEMENT Left 06/19/2023   Procedure: LEFT ARM BRACHIOCEPHALIC ARTERIOVENOUS (AV) FISTULA CREATION;  Surgeon: Young Hensen, MD;  Location: [facility] OR;  Service: Vascular;  Laterality:  Left;   COLONOSCOPY  02/2020   in [facility]   HERNIA REPAIR  02/2019   IR THORACENTESIS ASP PLEURAL SPACE W/IMG GUIDE  06/28/2020   IR THORACENTESIS ASP PLEURAL SPACE W/IMG GUIDE  08/31/2020   IR THORACENTESIS ASP PLEURAL SPACE W/IMG GUIDE  06/27/2022   LIGATION OF ARTERIOVENOUS  FISTULA Left 10/08/2023   Procedure: SIDEBRANCH LIGATION;  Surgeon: Young Hensen, MD;  Location: [facility] OR;  Service: Vascular;  Laterality: Left;   PERIPHERAL VASCULAR BALLOON ANGIOPLASTY  12/18/2023   Procedure: PERIPHERAL VASCULAR BALLOON ANGIOPLASTY;  Surgeon: Young Hensen, MD;  Location: [facility] INVASIVE CV LAB;  Service: Cardiovascular;;   PRESSURE SENSOR/CARDIOMEMS N/A 12/26/2020   Procedure: PRESSURE SENSOR/CARDIOMEMS;  Surgeon: Darlis Eisenmenger, MD;  Location: [facility] INVASIVE CV LAB;  Service: Cardiovascular;  Laterality: N/A;   REVISON OF ARTERIOVENOUS FISTULA Left 10/08/2023   Procedure: LEFT ARM ARTERIOVENOUS FISTULA REVISION;  Surgeon: Young Hensen, MD;  Location: [facility] OR;  Service: Vascular;  Laterality: Left;   RIGHT HEART CATH N/A 09/07/2020   Procedure: RIGHT HEART CATH;  Surgeon: Darlis Eisenmenger, MD;  Location: [facility] INVASIVE CV LAB;  Service: Cardiovascular;  Laterality: N/A;  RIGHT HEART CATH N/A 07/02/2022   Procedure: RIGHT HEART CATH;  Surgeon: Darlis Eisenmenger, MD;  Location: [facility] INVASIVE CV LAB;  Service: Cardiovascular;  Laterality: N/A;   UPPER GI ENDOSCOPY  02/2020   in [facility]    Short Social History:  Social History   Tobacco Use   Smoking status: Never   Smokeless tobacco: Never  Substance Use Topics   Alcohol use: Never    Allergies  Allergen Reactions   Codeine Other (See Comments)    Mouth sores    Prednisone Other (See Comments)    Delusions    Propoxyphene Itching   Moxifloxacin Rash   Nsaids Rash    No current facility-administered medications for this encounter.    REVIEW OF SYSTEMS All other systems were reviewed and are negative     Objective:   Objective    Vitals:   05/19/24 1028  BP: 116/60  Pulse: 69  Resp: 12  SpO2: 99%   There is no height or weight on file to calculate BMI.  Physical Exam General: no acute distress Cardiac: hemodynamically stable Extremities: Left arm brachiocephalic fistula small but with palpable thrill  Data: Reviewed Dr. Spurgeon Dyer fistulogram from January     Assessment/Plan:   Rita [name] is a 68 y.o. female with ESRD presenting for fistulogram with intervention.  Having issues with flows in cannulation, has a left IJ TDC which has been being used due to issues with the left arm aVF. Last HD session yesterday. Reviewed risks and benefits of fistulogram with possible intervention and patient agreed to proceed.    Delaney Fearing, MD [facility]

## 2024-05-19 NOTE — Op Note (Signed)
    Patient name: Rita [name] MRN: [id number] DOB: August 28, 1956 Sex: female  05/19/2024 Pre-operative Diagnosis: ESRD on HD, nonfunctioning left arm aVF Post-operative diagnosis:  Same Surgeon:  Philipp Brawn, MD Procedure Performed:  Ultrasound-guided access of the left arm aVF antegrade Ultrasound-guided access of left arm aVF retrograde Fistulogram and central venogram Third order cannulation of left innominate vein Balloon angioplasty of left innominate vein, 10 mm x 80 mm Mustang Balloon angioplasty of anastomosis, 6 mm x 40 mm Mustang   Indications: Rita [name] is a 68 year old female with ESRD on HD who has had a left brachiocephalic AVF creation in the past.  She has undergone intervention with Dr. Fulton Job for sidebranch ligation as well as balloon angioplasty of the anastomosis back in January.  She presents to the HD access center today due to inability to use the left arm aVF and she has been undergoing dialysis via a left IJ TDC.  Risks and benefits of fistulogram with intervention were reviewed, she expressed understanding and elects to proceed.  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  Widely patent upper arm cephalic vein, widely patent AV fistula.  There is a 70% stenosis of the anastomosis and proximal 2 cm of the AVF.   Procedure:  The patient was identified in the holding area and taken to the cath lab  The patient was then placed supine on the table and prepped and draped in the usual sterile fashion.  A time out was called.  Ultrasound was used to evaluate the left arm AV access. This was accessed under u/s guidance. An 018 wire was advanced without resistance, a micropuncture sheath was placed and fistulagram obtained which demonstrated the above findings.  Given the severe anastomotic stenosis I elected to then access the AVF under ultrasound guidance in a retrograde fashion  towards the anastomosis.  The micropuncture sheath was placed and a Glidewire was used to navigate across the anastomosis into the brachial artery.  This access was then upsized to a 6 French sheath in the anastomosis was treated with a 6 mm x 40 mm Mustang balloon.  There was a adequate response with an approximate 30% residual stenosis.  I then upsized the antegrade access to a 6 French sheath and the Glidewire was used to navigate across the central stenosis associated with the catheter.  The catheter associated stenosis was then treated with a 10 mm x 80 mm Mustang balloon.  Completion angiography demonstrated adequate response with minimal residual stenosis.  The wire was removed.  Both sheaths were removed and the access these were managed with figure-of-eight 4-0 Monocryl sutures for hemostasis.  Contrast: 60 cc   Impression: Adequate response to balloon angioplasty of the innominate catheter associated stenosis and the anastomotic stenosis. If continues to have issues with HD she would need creation of a new access.   Philipp Brawn MD [facility] [place] Office: [phone]

## 2024-05-20 ENCOUNTER — Encounter (HOSPITAL_COMMUNITY): Payer: Self-pay | Admitting: Vascular Surgery

## 2024-05-21 ENCOUNTER — Encounter (HOSPITAL_COMMUNITY): Payer: Self-pay | Admitting: Vascular Surgery

## 2024-06-24 ENCOUNTER — Other Ambulatory Visit: Payer: Self-pay | Admitting: *Deleted

## 2024-06-24 DIAGNOSIS — N186 End stage renal disease: Secondary | ICD-10-CM

## 2024-06-29 ENCOUNTER — Ambulatory Visit (HOSPITAL_COMMUNITY)
Admission: RE | Admit: 2024-06-29 | Discharge: 2024-06-29 | Disposition: A | Discharge status: Home / Self Care | Source: Ambulatory Visit | Attending: Vascular Surgery | Admitting: Vascular Surgery

## 2024-06-29 ENCOUNTER — Ambulatory Visit: Attending: Vascular Surgery | Admitting: Vascular Surgery

## 2024-06-29 ENCOUNTER — Encounter: Payer: Self-pay | Admitting: Vascular Surgery

## 2024-06-29 ENCOUNTER — Ambulatory Visit (HOSPITAL_BASED_OUTPATIENT_CLINIC_OR_DEPARTMENT_OTHER)
Admission: RE | Admit: 2024-06-29 | Discharge: 2024-06-29 | Discharge status: Home / Self Care | Source: Ambulatory Visit | Attending: Vascular Surgery

## 2024-06-29 VITALS — BP 92/53 | HR 66 | Temp 97.9°F | Resp 20 | Ht 61.0 in | Wt 180.3 lb

## 2024-06-29 DIAGNOSIS — Z992 Dependence on renal dialysis: Secondary | ICD-10-CM

## 2024-06-29 DIAGNOSIS — N186 End stage renal disease: Secondary | ICD-10-CM | POA: Diagnosis not present

## 2024-06-29 NOTE — Progress Notes (Signed)
 Patient name: Rita [name] MRN: [id number] DOB: [birth year] Sex: female  REASON FOR VISIT: Evaluate for new access  HPI: Rita [name] is a 68 y.o. female with history A-fib, diabetes, CHF, ESRD that presents for new access evaluation.  She had a prior left brachiocephalic AV fistula placed on 2/88/7975.  This underwent sidebranch ligation on 10/08/2023.  Recently had a fistulogram on 05/19/2024 with Dr. Pearline with a anastomotic stenosis.  Currently using a left IJ TDC.  States the fistula has not been working.  She would like to keep access in her left arm.  Unsure about any further surgery at this time.  Past Medical History:  Diagnosis Date   Acute hypoxemic respiratory failure due to COVID-19 (HCC) 12/30/2019   Ambulates with cane    or walker   Anemia    Hx  Blood Transfusion in 04/2020 [facility]   Arthritis    Atrial fibrillation (HCC)    Cancer (HCC)    breast Cancer 35 yrs ago as of 06/17/23, no restrictions per patient for IV/Blood draws   Chronic respiratory failure with hypoxia, on home O2 therapy (HCC)    2L via [place] prn and qhs   CKD (chronic kidney disease), stage III (HCC)    dialysis M-W-F   COVID-19 12/30/2019   resolved   Diabetes mellitus without complication (HCC)    type 2 - diet controlled, no meds.  Patient does not check her CBGs.   Diastolic CHF (HCC)    Dyspnea    on oxygen  2L qhs via [place]   GI bleed    hx   Gout    hx - no current problems as of 06/17/23   Hypertension    Oxygen  dependent    qhs and prn - 2L via [place]   Pneumonia    x several many yrs ago per patient on 06/17/23   Sleep apnea    does not use CPAP, uses oxygen  2L via [place]   Thrombocytopenia (HCC)     Past Surgical History:  Procedure Laterality Date   A/V FISTULAGRAM Left 10/02/2023   Procedure: A/V Fistulagram;  Surgeon: [name], MD;  Location: [facility] INVASIVE CV [facility];  Service: Cardiovascular;  Laterality: Left;   A/V FISTULAGRAM Left 12/18/2023   Procedure: A/V  Fistulagram;  Surgeon: [name], MD;  Location: [facility] INVASIVE CV LAB;  Service: Cardiovascular;  Laterality: Left;   A/V SHUNT INTERVENTION N/A 05/19/2024   Procedure: A/V SHUNT INTERVENTION;  Surgeon: Pearline Norman RAMAN, MD;  Location: [facility] PV LAB;  Service: Cardiovascular;  Laterality: N/A;   AV FISTULA PLACEMENT Left 06/19/2023   Procedure: LEFT ARM BRACHIOCEPHALIC ARTERIOVENOUS (AV) FISTULA CREATION;  Surgeon: [name], MD;  Location: [facility] OR;  Service: Vascular;  Laterality: Left;   COLONOSCOPY  02/2020   in [facility]   HERNIA REPAIR  02/2019   IR THORACENTESIS ASP PLEURAL SPACE W/IMG GUIDE  06/28/2020   IR THORACENTESIS ASP PLEURAL SPACE W/IMG GUIDE  08/31/2020   IR THORACENTESIS ASP PLEURAL SPACE W/IMG GUIDE  06/27/2022   LIGATION OF ARTERIOVENOUS  FISTULA Left 10/08/2023   Procedure: SIDEBRANCH LIGATION;  Surgeon: [name], MD;  Location: [facility] OR;  Service: Vascular;  Laterality: Left;   PERIPHERAL VASCULAR BALLOON ANGIOPLASTY  12/18/2023   Procedure: PERIPHERAL VASCULAR BALLOON ANGIOPLASTY;  Surgeon: [name], MD;  Location: [facility] CV [facility];  Service: Cardiovascular;;   PRESSURE SENSOR/CARDIOMEMS N/A 12/26/2020   Procedure: PRESSURE SENSOR/CARDIOMEMS;  Surgeon: Rolan Barrack  S, MD;  Location: [facility];  Service: Cardiovascular;  Laterality: N/A;   REVISON OF ARTERIOVENOUS FISTULA Left 10/08/2023   Procedure: LEFT ARM ARTERIOVENOUS FISTULA REVISION;  Surgeon: [name], MD;  Location: [facility] OR;  Service: Vascular;  Laterality: Left;   RIGHT HEART CATH N/A 09/07/2020   Procedure: RIGHT HEART CATH;  Surgeon: Rolan Ezra RAMAN, MD;  Location: [facility] INVASIVE CV LAB;  Service: Cardiovascular;  Laterality: N/A;   RIGHT HEART CATH N/A 07/02/2022   Procedure: RIGHT HEART CATH;  Surgeon: Rolan Ezra RAMAN, MD;  Location: [facility] INVASIVE CV LAB;  Service: Cardiovascular;  Laterality: N/A;   UPPER GI ENDOSCOPY  02/2020   in [facility]   VENOUS ANGIOPLASTY   05/19/2024   Procedure: VENOUS ANGIOPLASTY;  Surgeon: Pearline Norman RAMAN, MD;  Location: [facility];  Service: Cardiovascular;;  innominate/VA    Family History  Problem Relation Age of Onset   Cancer Mother     SOCIAL HISTORY: Social History   Tobacco Use   Smoking status: Never   Smokeless tobacco: Never  Substance Use Topics   Alcohol use: Never    Allergies  Allergen Reactions   Codeine Other (See Comments)    Mouth sores    Prednisone Other (See Comments)    Delusions    Propoxyphene Itching   Moxifloxacin Rash   Nsaids Rash    Current Outpatient Medications  Medication Sig Dispense Refill   acetaminophen  (TYLENOL ) 325 MG tablet Take 650 mg by mouth every 6 (six) hours as needed (pain.).     allopurinol  (ZYLOPRIM ) 100 MG tablet Take 100 mg by mouth 2 (two) times daily.     aspirin  EC 81 MG tablet Take 81 mg by mouth daily after breakfast. Swallow whole.     blood glucose meter kit and supplies Dispense based on patient and insurance preference. Use up to four times daily as directed. (FOR ICD-10 E10.9, E11.9). 1 each 0   Blood Glucose Monitoring Suppl (FIFTY50 GLUCOSE METER 2.0) w/Device KIT See admin instructions.     carvedilol  (COREG ) 6.25 MG tablet TAKE 1 TABLET(6.25 MG) BY MOUTH TWICE DAILY (Patient taking differently: Take 6.25 mg by mouth See admin instructions. Take 1 tablet (6.25 mg) by mouth on Tuesdays, Thursdays Saturdays, & Sundays twice a day.) 180 tablet 1   HYDROcodone -acetaminophen  (NORCO/VICODIN) 5-325 MG tablet Take 1 tablet by mouth every 4 (four) hours as needed for moderate pain (pain score 4-6) or severe pain (pain score 7-10). 7 tablet 0   Menthol-Methyl Salicylate (MUSCLE RUB) 10-15 % CREA Apply 1 application topically as needed for muscle pain (knees).      OXYGEN  Inhale 2 L/min into the lungs at bedtime.     pantoprazole  (PROTONIX ) 40 MG tablet Take 40 mg by mouth daily after breakfast.     polyethylene glycol (MIRALAX  / GLYCOLAX ) 17 g packet  Take 17 g by mouth daily as needed for mild constipation or moderate constipation.     torsemide  (DEMADEX ) 100 MG tablet Take 100 mg by mouth every Tuesday, Thursday, Saturday, and Sunday.     No current facility-administered medications for this visit.    REVIEW OF SYSTEMS:  [X]  denotes positive finding, [ ]  denotes negative finding Cardiac  Comments:  Chest pain or chest pressure:    Shortness of breath upon exertion:    Short of breath when lying flat:    Irregular heart rhythm:        Vascular    Pain in calf, thigh,  or hip brought on by ambulation:    Pain in feet at night that wakes you up from your sleep:     Blood clot in your veins:    Leg swelling:         Pulmonary    Oxygen  at home:    Productive cough:     Wheezing:         Neurologic    Sudden weakness in arms or legs:     Sudden numbness in arms or legs:     Sudden onset of difficulty speaking or slurred speech:    Temporary loss of vision in one eye:     Problems with dizziness:         Gastrointestinal    Blood in stool:     Vomited blood:         Genitourinary    Burning when urinating:     Blood in urine:        Psychiatric    Major depression:         Hematologic    Bleeding problems:    Problems with blood clotting too easily:        Skin    Rashes or ulcers:        Constitutional    Fever or chills:      PHYSICAL EXAM: Vitals:   06/29/24 1336  BP: (!) 92/53  Pulse: 66  Resp: 20  Temp: 97.9 F (36.6 C)  TempSrc: Temporal  SpO2: 99%  Weight: 180 lb 4.8 oz (81.8 kg)  Height: 5' 1 (1.549 m)    GENERAL: The patient is a well-nourished female, in no acute distress. The vital signs are documented above. CARDIAC: There is a regular rate and rhythm.  VASCULAR:  Left brachiocephalic AV fistula with pulsatile thrill Left radial pulse palpable Left IJ TDC PULMONARY: No respiratory distress. ABDOMEN: Soft and non-tender. MUSCULOSKELETAL: There are no major deformities or  cyanosis. NEUROLOGIC: No focal weakness or paresthesias are detected. SKIN: There are no ulcers or rashes noted. PSYCHIATRIC: The patient has a normal affect.  DATA:   UPPER EXTREMITY VEIN MAPPING  Patient Name:  Rita [name]  Date of Exam:   06/29/2024 Medical Rec #: [id number]                   Accession #:    [id number] Date of Birth: [birth year]                    Patient Gender: F Patient Age:   34 years Exam Location:  [address] Procedure:      VAS US  UPPER EXT VEIN MAPPING (PRE-OP  AVF) Referring Phys: LONNI GASKINS   --------------------------------------------------------------------------- -----   Indications: Pre-access.  Performing Technologist: Duwaine Hives RVS    Examination Guidelines: A complete evaluation includes B-mode imaging, spectral Doppler, color Doppler, and power Doppler as needed of all accessible portions of each vessel. Bilateral testing is considered an integral part of a complete examination. Limited examinations for reoccurring indications may be performed as noted.  +-----------------+-------------+----------+---------+ Right Cephalic   Diameter (cm)Depth (cm)Findings  +-----------------+-------------+----------+---------+ Shoulder             0.19                         +-----------------+-------------+----------+---------+ Prox upper arm       0.22                         +-----------------+-------------+----------+---------+  Mid upper arm        0.28                         +-----------------+-------------+----------+---------+ Dist upper arm       0.31                         +-----------------+-------------+----------+---------+ Antecubital fossa    0.41               branching +-----------------+-------------+----------+---------+ Prox forearm         0.21                         +-----------------+-------------+----------+---------+ Mid forearm          0.25                          +-----------------+-------------+----------+---------+ Dist forearm         0.17                         +-----------------+-------------+----------+---------+ Wrist                0.16                         +-----------------+-------------+----------+---------+  +-----------------+-------------+----------+--------+ Right Basilic    Diameter (cm)Depth (cm)Findings +-----------------+-------------+----------+--------+ Mid upper arm        0.21                        +-----------------+-------------+----------+--------+ Dist upper arm       0.15                        +-----------------+-------------+----------+--------+ Antecubital fossa    0.14                        +-----------------+-------------+----------+--------+ Prox forearm         0.20                        +-----------------+-------------+----------+--------+ Mid forearm          0.16                        +-----------------+-------------+----------+--------+  +-----------------+-------------+----------+--------+ Left Cephalic    Diameter (cm)Depth (cm)Findings +-----------------+-------------+----------+--------+ Shoulder                                  avf    +-----------------+-------------+----------+--------+ Prox upper arm                            avf    +-----------------+-------------+----------+--------+ Mid upper arm                             avf    +-----------------+-------------+----------+--------+ Dist upper arm                            avf    +-----------------+-------------+----------+--------+ Antecubital fossa  avf    +-----------------+-------------+----------+--------+ Prox forearm         0.15                        +-----------------+-------------+----------+--------+ Mid forearm          0.16                         +-----------------+-------------+----------+--------+ Dist forearm         0.13                        +-----------------+-------------+----------+--------+ Wrist                0.07                        +-----------------+-------------+----------+--------+  +-----------------+-------------+----------+--------+ Left Basilic     Diameter (cm)Depth (cm)Findings +-----------------+-------------+----------+--------+ Dist upper arm       0.30                        +-----------------+-------------+----------+--------+ Antecubital fossa    0.19                        +-----------------+-------------+----------+--------+ Prox forearm         0.17                        +-----------------+-------------+----------+--------+  *See table(s) above for measurements and observations.     Diagnosing physician: Lonni Gaskins MD Electronically signed by Lonni Gaskins MD on 06/29/2024 at 1:47:11 PM.    Assessment/Plan:  68 y.o. female with history A-fib, diabetes, CHF, ESRD that presents for new access evaluation.  She has a prior left brachiocephalic AV fistula placed on 2/88/7975.  This underwent sidebranch ligation on 10/08/2023.  Recently had a fistulogram on 05/19/2024 with Dr. Pearline with anastomotic stenosis.  I discussed with her that I do not think any further fistulogram's would be worthwhile at this time.  I have recommended surgical revision versus new left upper arm access likely an AV graft.  I reviewed her recent fistulogram pictures and the biggest issue appears to be a sclerotic and stenotic cephalic vein adjacent to the artery anastomosis.  Discussed we could likely revise this in the operating room and potentially create a new anastomosis or jump graft versus new upper arm graft.  She would like to think about her options.  She will call back to schedule if she elects to proceed   Lonni DOROTHA Gaskins, MD [facility]: [phone]

## 2024-08-04 ENCOUNTER — Telehealth: Payer: Self-pay

## 2024-08-04 NOTE — Telephone Encounter (Signed)
 Patient saw Dr. Gretta on 7/22.  Dr. Gretta suggested L arm AVF revision vs AVG placement.  Patient requested to call when ready for scheduling.  Patient has not outreached to this office.  Letter generated / paper work sent to scanning.

## 2024-08-17 ENCOUNTER — Telehealth: Payer: Self-pay

## 2024-08-17 ENCOUNTER — Other Ambulatory Visit: Payer: Self-pay

## 2024-08-17 DIAGNOSIS — N186 End stage renal disease: Secondary | ICD-10-CM

## 2024-08-17 NOTE — Telephone Encounter (Signed)
 Attempted to return call to patient.  Mailbox full - unable to leave message.

## 2024-08-27 ENCOUNTER — Other Ambulatory Visit: Payer: Self-pay

## 2024-08-27 NOTE — Progress Notes (Signed)
 SDW CALL  Patient was given pre-op  instructions over the phone. The opportunity was given for the patient to ask questions. No further questions asked. Patient verbalized understanding of instructions given.   PCP - Thurmond Cathlyn LABOR., MD  Cardiologist -   PPM/ICD - denies Device Orders - n/a Rep Notified - n/a  Chest x-ray - denies EKG - DOS Stress Test - denies ECHO - 04-08-23 Cardiac Cath - 07-02-22  Sleep Study - 12-13-21 CPAP - uses 02 2l at hs  DM - per patient she does not check blood sugar at home. MD monitors A1c. Last A1c 7.4 oan 01-09-23  Blood Thinner Instructions:denies Aspirin  Instructions:continue  ERAS Protcol -NPO   COVID TEST- na   Anesthesia review: no  Patient denies shortness of breath, fever, cough and chest pain over the phone call   All instructions explained to the patient, with a verbal understanding of the material. Patient agrees to go over the instructions while at home for a better understanding.

## 2024-08-29 NOTE — Anesthesia Preprocedure Evaluation (Signed)
 Anesthesia Evaluation    Reviewed: Allergy & Precautions, Patient's Chart, lab work & pertinent test results, Unable to perform ROS - Chart review only  Airway        Dental   Pulmonary shortness of breath and Long-Term Oxygen  Therapy, sleep apnea and Oxygen  sleep apnea           Cardiovascular hypertension, Pt. on medications and Pt. on home beta blockers +CHF       Neuro/Psych negative neurological ROS     GI/Hepatic negative GI ROS, Neg liver ROS,,,  Endo/Other  diabetes, Type 2    Renal/GU ESRFRenal disease     Musculoskeletal  (+) Arthritis ,    Abdominal   Peds  Hematology  (+) Blood dyscrasia, anemia   Anesthesia Other Findings   Reproductive/Obstetrics                              Anesthesia Physical Anesthesia Plan  ASA: 4  Anesthesia Plan:    Post-op Pain Management: Tylenol  PO (pre-op )*   Induction:   PONV Risk Score and Plan: 2 and Ondansetron   Airway Management Planned: Natural Airway and Simple Face Mask  Additional Equipment:   Intra-op Plan:   Post-operative Plan:   Informed Consent: I have reviewed the patients History and Physical, chart, labs and discussed the procedure including the risks, benefits and alternatives for the proposed anesthesia with the patient or authorized representative who has indicated his/her understanding and acceptance.       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  Most recent echo 04/08/2023 showed EF 40 to 45%,  low normal RV function, RVSP 64 mmHg, mild to moderate tricuspid regurgitation, mild mitral regurgitation.  Last seen by Dr. Rolan 03/21/2023.  Per note, her breathing was noted to be better now that she is on HD; able to walk with cane or walker without dyspnea on flat ground.  Per clearance scanned under media tab 06/16/2023, Dr. Rolan cleared patient to proceed with surgery at moderate risk from cardiac standpoint.  Underwent left 1st stage fistula creation 06/19/23 without complication.  History of left-sided breast cancer 30 years ago treated with surgery and chemoradiation.  IDDM2, A1c 7.4 on 01/09/2023.  History of chronic thrombocytopenia.  Will need DOS labs and evaluation.  EKG 03/21/2023: Atrial fibrillation.  Rate 72.  Right axis deviation.  Nonspecific intraventricular conduction delay.  TTE 04/08/2023: 1. Left ventricular ejection fraction, by estimation, is 40 to 45%. Left  ventricular ejection fraction by 3D volume is 45 %. The left ventricle has  mildly decreased function. The left ventricle demonstrates global  hypokinesis. Left ventricular diastolic  function could not be evaluated.  2. Right ventricular systolic function is low normal. The right  ventricular size is mildly enlarged. There is severely elevated pulmonary  artery systolic pressure. The estimated right ventricular systolic  pressure is 64.0 mmHg.  3. Left atrial size was mildly dilated.  4. Right atrial size was moderately dilated.  5. The mitral valve is abnormal. Mild mitral valve regurgitation.  6. Tricuspid valve regurgitation is mild to moderate.  7. The aortic valve is tricuspid. Aortic valve regurgitation is not  visualized.  8. The inferior vena cava is dilated in size with <50% respiratory  variability, suggesting right atrial pressure of 15 mmHg.  9. Cannot exclude a small PFO.  10. Rhythm strip during this exam demonstrates atrial fibrillation.   Comparison(s): Changes from  prior study are noted. 12/28/2021: LVEF 55-60%.   )         Anesthesia Quick Evaluation

## 2024-08-30 ENCOUNTER — Other Ambulatory Visit (HOSPITAL_COMMUNITY): Payer: Self-pay

## 2024-08-30 ENCOUNTER — Ambulatory Visit (HOSPITAL_COMMUNITY)
Admission: RE | Admit: 2024-08-30 | Discharge: 2024-08-30 | Disposition: A | Discharge status: Home / Self Care | Attending: Vascular Surgery | Admitting: Vascular Surgery

## 2024-08-30 ENCOUNTER — Ambulatory Visit (HOSPITAL_COMMUNITY): Payer: Self-pay | Admitting: Certified Registered"

## 2024-08-30 ENCOUNTER — Encounter (HOSPITAL_COMMUNITY)
Admission: RE | Disposition: A | Discharge status: Home / Self Care | Payer: Self-pay | Source: Home / Self Care | Attending: Vascular Surgery

## 2024-08-30 DIAGNOSIS — N186 End stage renal disease: Secondary | ICD-10-CM | POA: Diagnosis present

## 2024-08-30 DIAGNOSIS — Z538 Procedure and treatment not carried out for other reasons: Secondary | ICD-10-CM | POA: Insufficient documentation

## 2024-08-30 LAB — GLUCOSE, CAPILLARY: Glucose-Capillary: 141 mg/dL — ABNORMAL HIGH (ref 70–99)

## 2024-08-30 SURGERY — REVISON OF ARTERIOVENOUS FISTULA
Anesthesia: Choice | Laterality: Left

## 2024-08-30 MED ORDER — DEXAMETHASONE SODIUM PHOSPHATE 10 MG/ML IJ SOLN
INTRAMUSCULAR | Status: AC
  Filled 2024-08-30: qty 1

## 2024-08-30 MED ORDER — ORAL CARE MOUTH RINSE: 15.0000 mL | Freq: Once | OROMUCOSAL | Status: DC

## 2024-08-30 MED ORDER — ROCURONIUM BROMIDE 10 MG/ML (PF) SYRINGE
PREFILLED_SYRINGE | INTRAVENOUS | Status: AC
  Filled 2024-08-30: qty 10

## 2024-08-30 MED ORDER — CEFAZOLIN SODIUM-DEXTROSE 2-4 GM/100ML-% IV SOLN
2.0000 g | INTRAVENOUS | Status: DC
  Filled 2024-08-30: qty 100

## 2024-08-30 MED ORDER — PHENYLEPHRINE 80 MCG/ML (10ML) SYRINGE FOR IV PUSH (FOR BLOOD PRESSURE SUPPORT)
PREFILLED_SYRINGE | INTRAVENOUS | Status: AC
  Filled 2024-08-30: qty 10

## 2024-08-30 MED ORDER — CHLORHEXIDINE GLUCONATE 4 % EX SOLN: 60.0000 mL | Freq: Once | CUTANEOUS | Status: DC

## 2024-08-30 MED ORDER — ONDANSETRON HCL 4 MG/2ML IJ SOLN
INTRAMUSCULAR | Status: AC
  Filled 2024-08-30: qty 2

## 2024-08-30 MED ORDER — SODIUM CHLORIDE 0.9 % IV SOLN: INTRAVENOUS | Status: DC

## 2024-08-30 MED ORDER — PROPOFOL 10 MG/ML IV BOLUS
INTRAVENOUS | Status: AC
  Filled 2024-08-30: qty 20

## 2024-08-30 MED ORDER — EPHEDRINE 5 MG/ML INJ
INTRAVENOUS | Status: AC
  Filled 2024-08-30: qty 5

## 2024-08-30 MED ORDER — LIDOCAINE 2% (20 MG/ML) 5 ML SYRINGE
INTRAMUSCULAR | Status: AC
  Filled 2024-08-30: qty 5

## 2024-08-30 MED ORDER — CHLORHEXIDINE GLUCONATE 0.12 % MT SOLN
15.0000 mL | Freq: Once | OROMUCOSAL | Status: DC
  Filled 2024-08-30: qty 15

## 2024-08-30 NOTE — Telephone Encounter (Signed)
 Called to confirm/remind patient of their appointment at the [facility] on 08/31/24.   Appointment:   [x] Confirmed  [] Left mess   [] No answer/No voice mail  [] VM Full/unable to leave message  [] Phone not in service  Patient reminded to bring all medications and/or complete list.  Confirmed patient has transportation. Gave directions, instructed to utilize valet parking.

## 2024-08-30 NOTE — Progress Notes (Addendum)
 Per anesthesia patient's surgery will need to be rescheduled due to the need to be seen by cardiology.  Patient dressed and discharged, no problems noted.   Patient waited to speak to Dr. Gretta before leaving.

## 2024-08-31 ENCOUNTER — Encounter (HOSPITAL_COMMUNITY): Payer: Self-pay

## 2024-08-31 ENCOUNTER — Ambulatory Visit (HOSPITAL_COMMUNITY)
Admission: RE | Admit: 2024-08-31 | Discharge: 2024-08-31 | Disposition: A | Discharge status: Home / Self Care | Source: Ambulatory Visit | Attending: Family Medicine | Admitting: Family Medicine

## 2024-08-31 VITALS — BP 148/74 | HR 74 | Wt 180.2 lb

## 2024-08-31 DIAGNOSIS — I482 Chronic atrial fibrillation, unspecified: Secondary | ICD-10-CM | POA: Diagnosis not present

## 2024-08-31 DIAGNOSIS — K5521 Angiodysplasia of colon with hemorrhage: Secondary | ICD-10-CM | POA: Insufficient documentation

## 2024-08-31 DIAGNOSIS — I130 Hypertensive heart and chronic kidney disease with heart failure and stage 1 through stage 4 chronic kidney disease, or unspecified chronic kidney disease: Secondary | ICD-10-CM | POA: Diagnosis not present

## 2024-08-31 DIAGNOSIS — I4821 Permanent atrial fibrillation: Secondary | ICD-10-CM | POA: Insufficient documentation

## 2024-08-31 DIAGNOSIS — Z95828 Presence of other vascular implants and grafts: Secondary | ICD-10-CM | POA: Diagnosis not present

## 2024-08-31 DIAGNOSIS — Z79899 Other long term (current) drug therapy: Secondary | ICD-10-CM | POA: Diagnosis not present

## 2024-08-31 DIAGNOSIS — E1122 Type 2 diabetes mellitus with diabetic chronic kidney disease: Secondary | ICD-10-CM | POA: Diagnosis not present

## 2024-08-31 DIAGNOSIS — I10 Essential (primary) hypertension: Secondary | ICD-10-CM | POA: Diagnosis not present

## 2024-08-31 DIAGNOSIS — J9611 Chronic respiratory failure with hypoxia: Secondary | ICD-10-CM | POA: Diagnosis not present

## 2024-08-31 DIAGNOSIS — N186 End stage renal disease: Secondary | ICD-10-CM | POA: Diagnosis not present

## 2024-08-31 DIAGNOSIS — I5032 Chronic diastolic (congestive) heart failure: Secondary | ICD-10-CM | POA: Insufficient documentation

## 2024-08-31 DIAGNOSIS — Z853 Personal history of malignant neoplasm of breast: Secondary | ICD-10-CM | POA: Diagnosis not present

## 2024-08-31 DIAGNOSIS — Z992 Dependence on renal dialysis: Secondary | ICD-10-CM | POA: Diagnosis not present

## 2024-08-31 DIAGNOSIS — Z01818 Encounter for other preprocedural examination: Secondary | ICD-10-CM

## 2024-08-31 DIAGNOSIS — D649 Anemia, unspecified: Secondary | ICD-10-CM | POA: Diagnosis not present

## 2024-08-31 DIAGNOSIS — D696 Thrombocytopenia, unspecified: Secondary | ICD-10-CM | POA: Insufficient documentation

## 2024-08-31 DIAGNOSIS — Z8616 Personal history of COVID-19: Secondary | ICD-10-CM | POA: Diagnosis not present

## 2024-08-31 DIAGNOSIS — G4733 Obstructive sleep apnea (adult) (pediatric): Secondary | ICD-10-CM | POA: Insufficient documentation

## 2024-08-31 DIAGNOSIS — I071 Rheumatic tricuspid insufficiency: Secondary | ICD-10-CM | POA: Insufficient documentation

## 2024-08-31 DIAGNOSIS — K761 Chronic passive congestion of liver: Secondary | ICD-10-CM

## 2024-08-31 DIAGNOSIS — Z86718 Personal history of other venous thrombosis and embolism: Secondary | ICD-10-CM

## 2024-08-31 DIAGNOSIS — N184 Chronic kidney disease, stage 4 (severe): Secondary | ICD-10-CM

## 2024-08-31 DIAGNOSIS — Z8719 Personal history of other diseases of the digestive system: Secondary | ICD-10-CM

## 2024-08-31 NOTE — Progress Notes (Signed)
 [facility] PCP: Dr Thurmond Nephrology: Dr. Jerrye HF Cardiology: Dr Rolan   HPI: Rita [name] is a 68 y.o. 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    Rita [name] has had multiple hospitalizations in the last year.  Rita [name] was admitted January 2021 for COVID-19 pneumonia w/ acute hypoxic respiratory failure.  Rita [name] did not require intubation.  Rita [name] was treated with steroids and remdesivir .  Rita [name] was readmitted twice in 7/21 for CHF exacerbations complicated by pleural effusion requiring IR guided thoracentesis.  Rita [name] was readmitted again 8/21 for recurrent CHF and diuresed with IV Lasix .  Echo 8/21 showed moderately reduced systolic function, EF 35 to 40% with mild concentric left ventricular hypertrophy and grade 2 diastolic dysfunction, mild hypokinesis of the LV apical segment, RV systolic function was normal.    Readmitted recurrent CHF exacerbation plus AKI on CKD in 9/21.  Previous creatinine last admit was 1.76. Creatinine on this admission elevated at 2.06. Chest x-ray demonstrated cardiomegaly and pulmonary venous congestion with recurrent right-sided pleural effusion.  BNP 673.  CBC showed chronic anemia, hemoglobin 8.9.  Also with worsening thrombocytopenia.  Platelets down to 84K. Echo repeated with EF 40-45%, RV ok, PASP 51 mmHg. Placed on milrinone  to support RV for diuresis. Discharge weight 225 pounds. Rita [name] was discharged on bidil  but too expensive. Changed to Imdur  + hydral. Cardiac MRI was done this admission, showed LV EF 41%, RV EF 27%, D-shaped  septum, RV insertion site LGE (RV>LV failure), no evidence for cardiac amyloidosis.   Rita [name] has seen EP for Watchman evaluation given inability to take anticoagulation.  Dr. Cindie thought that Rita [name] is not a candidate for left atrial appendage occlusion given the risks associated with general anesthesia.  S/p Cardiomems 1/22.  Echo in 1/23 showed EF 55-60%, mild LVH, mild RV enlargement with normal systolic function, IVC dilated, PASP 68 mmHg.   Follow up 4/23, NYHA III, volume overloaded on exam. Torsemide  increased to 80/40. Eventually, metolazone  added back weekly.  Admitted 7/23 with a/c dHF and massive volume overload. Diuresed with IV lasix , required milrinone  for RV support. Echo showed EF 60-65%, interventricular septum flattened in systole and diastole, consistent w/ RV pressure and volume overload, RV mildly enlarged, RV function normal. RHC showed mildly elevated right and left heart filling pressures, moderate mixed pulmonary venous/pulmonary arterial (likely due to OHS/OSA) HTN and preserved CO. IR consulted for R pleural effusion, s/p thoracentesis yielding 1.2 L. Nephrology consulted with worsening renal function, felt poor candidate for dialysis given RV failure. PMT consulted for GOC, outpatient referral to Palliative arranged. GDMT titrated but limited by CKD, discharged home, weight 178 lbs.   Patient was admitted to [facility] in 2/24 with CHF and PNA. Rita [name] was anemic and received 1 unit PRBCs.  Rita [name] had progressive renal failure and was started on HD this admission.  Echo in 2/24 showed EF 50-55% with mild-moderate RV enlargement and mild RV dysfunction.   Last seen in [facility] 03/2023.   S/p Left AVF placement 06/2023.  S/p revision of left AVF with sidebranch ligation x 2. S/p balloon angioplasty of left innominate vein and anastomosis  Today Rita [name] returns for HF follow up with Rita [name] husband.  We have not seen Rita [name] since 03/2023. Overall feeling fine. Rita [name] had planned revision of AVF  yesterday but procedure cancelled as Rita [name] needed cardiology clearance. Rita [name] is dialyzing thru [facility]. Rita [name] is not SOB walking with Rita [name] rolling walker, knee pain limits Rita [name] physically. Rita [name] make some urine. Follows with [facility], Dr. Norine. Rita [name] is unsure if Rita [name] has a chance for renal recovery. Denies palpitations, abnormal bleeding, CP, dizziness, edema, or PND/Orthopnea. Appetite ok. Weight at home 181 pounds. Taking all medications. Rita [name] wears 2L oxygen  at night. Not wearing CPAP at night.  ECG (personally reviewed): atrial fibrillation, IVCD QRS 120 msec  Labs (2/24): LDL 45, LFTs normal, Hgb 9 Labs (1/25): K 4.2, creatinine 5.10   PMH: 1. Chronic systolic => diastolic CHF: Rita [name] has prominent RV dysfunction.  - Echo 8/21 EF 35-40%, mod HK, G2DD, RV normal - Echo 9/21 EF 40-45%, global hypokinesis, D-shaped septum, at least mildly decreased RV systolic function.  - Cardiac MRI (9/21): LV EF 41%, diffuse hypokinesis, D-shaped septum, RV mildly dilated with RV EF 27%, nonspecific RV insertion site LGE.  No evidence for cardiac amyloidosis.  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  - RHC (10/21): mean RA 9, PA 54/17 mean 33, mean PCWP 20, CI 3.33, PVR 1.9 WU - Cardiomems placed 12/2020.  RHC (1/22) with mean RA 14, PA 60/22, mean PCWP 20, CI 3.06, PVR 3.2.  - Echo (1/23): EF 55-60%, mild LVH, mild RV enlargement with normal systolic function, IVC dilated, PASP 68 mmHg.  - Echo (7/23): EF 60-65%, interventricular septum flattened in systole and diastole, consistent w/ RV pressure and volume overload, RV mildly enlarged, RV function normal, severaly elevated PASP, LA severely dilated, trivial MR, severe TR. - RHC (7/23) mean RA 10, PA 57/21 mean 38, mean PCWP 22, CI 3.31, PVR 2.6 WU, PAPi 3.6 - Echo (2/24): EF 50-55% with mild-moderate RV enlargement and mild RV dysfunction - Echo (4/24): EF 40-45%, RV low/normal function, RVSP 64 mmhg, mild to moderate TR, IVC dilated. 2. ESRD on HD 3. Type 2 diabetes 4. Pleural effusion s/p  thoracentesis (due to CHF) 5. HTN 6. 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  7. COVID-19 infection 8. Atrial fibrillation: Chronic.  Not anticoagulated due to chronic anemia and GI bleeding.  - Not Watchman candidate per EP.  9. Anemia: Suspect due to chronic low grade GI bleeding (small bowel AVMs).  10. OSA/OHS: Uses CPAP.  11. Left breast cancer: Treated remotely with chemo/radiation.  12. GERD 13. Chronic mild thrombocytopenia  ROS: All systems negative except as listed in HPI, PMH and Problem List.  Social History   Socioeconomic History   Marital status: Married    Spouse name: Not on file   Number of children: Not on file   Years of education: Not on file   Highest education level: Not on file  Occupational History   Not on file  Tobacco Use   Smoking status: Never   Smokeless tobacco: Never  Vaping Use   Vaping status: Never Used  Substance and Sexual Activity   Alcohol use: Never   Drug use: Never   Sexual activity: Not Currently    Birth control/protection: Post-menopausal  Other Topics Concern   Not on file  Social History Narrative   Not on file   Social Drivers of Health   Financial Resource Strain: Not on file  Food Insecurity: Low Risk  (08/26/2023)   Received from [facility]   Hunger Vital Sign  Within the past 12 months, you worried that your food would run out before you got money to buy more: Never true    Within the past 12 months, the food you bought just didn't last and you didn't have money to get more. : Never true  Transportation Needs: No Transportation Needs (08/26/2023)   Received from [facility]    In the past 12 months, has lack of reliable transportation kept you from medical appointments, meetings, work or from getting things needed for daily living? : No  Physical Activity: Not on file  Stress: Not on file  Social Connections: Unknown (04/23/2022)   Received from [facility]   Social Network    Social Network: Not  on file  Intimate Partner Violence: Unknown (03/15/2022)   Received from [facility]   HITS    Physically Hurt: Not on file    Insult or Talk Down To: Not on file    Threaten Physical Harm: Not on file    Scream or Curse: Not on file   Family History  Problem Relation Age of Onset   Cancer Mother    Current Outpatient Medications  Medication Sig Dispense Refill   acetaminophen  (TYLENOL ) 325 MG tablet Take 650 mg by mouth every 6 (six) hours as needed (pain.).     allopurinol  (ZYLOPRIM ) 100 MG tablet Take 100 mg by mouth 2 (two) times daily.     aspirin  EC 81 MG tablet Take 81 mg by mouth daily after breakfast. Swallow whole.     carvedilol  (COREG ) 6.25 MG tablet TAKE 1 TABLET(6.25 MG) BY MOUTH TWICE DAILY 180 tablet 1   Menthol-Methyl Salicylate (MUSCLE RUB) 10-15 % CREA Apply 1 application topically as needed for muscle pain (knees).      OXYGEN  Inhale 2 L/min into the lungs at bedtime.     pantoprazole  (PROTONIX ) 40 MG tablet Take 40 mg by mouth daily after breakfast.     polyethylene glycol (MIRALAX  / GLYCOLAX ) 17 g packet Take 17 g by mouth daily as needed for mild constipation or moderate constipation.     sucroferric oxyhydroxide (VELPHORO) 500 MG chewable tablet Chew 500 mg by mouth 3 (three) times daily with meals. Also with snacks     torsemide  (DEMADEX ) 100 MG tablet Take 100 mg by mouth every Tuesday, Thursday, Saturday, and Sunday.     Blood Glucose Monitoring Suppl (FIFTY50 GLUCOSE METER 2.0) w/Device KIT See admin instructions. (Patient not taking: Reported on 08/31/2024)     multivitamin (RENA-VIT) TABS tablet Take 1 tablet by mouth daily. (Patient not taking: Reported on 08/31/2024)     No current facility-administered medications for this encounter.   BP (!) 148/74   Pulse 74   Wt 81.7 kg (180 lb 3.2 oz)   SpO2 98%   BMI 34.05 kg/m   Wt Readings from Last 3 Encounters:  08/31/24 81.7 kg (180 lb 3.2 oz)  08/30/24 76.2 kg (168 lb)  06/29/24 81.8 kg (180 lb 4.8 oz)    PHYSICAL EXAM: General:  NAD. No resp difficulty, walked into clinic with RW, elderly HEENT: Normal Neck: Supple. No JVD. Cor: Irregular rate & rhythm. No rubs, gallops or murmurs. Lungs: Clear, diminished in bases Abdomen: Soft, nontender, nondistended.  Extremities: No cyanosis, clubbing, rash, edema Neuro: Alert & oriented x 3, moves all 4 extremities w/o difficulty. Affect pleasant.  ASSESSMENT & PLAN: 1.  Chronic diastolic HF: Prominent RV failure.  9/21 echo showed EF 40-45%, global hypokinesis, D-shaped IV septum  suggestive of RV pressure/volume overload, RV poorly visualized but appeared normal in size with at least mild systolic dysfunction. Cause of cardiomyopathy is uncertain.  Rita [name] has history of COVID-19 1/21, cannot rule out viral myocarditis. Chemotherapy-mediated CMP is possible (not sure what chemo Rita [name] had remotely for breast cancer).  Cardiac MRI showed LV EF 41% with diffuse hypokinesis, RV mildly dilated but with EF 27% and D-shaped septum, nonspecific RV insertion site LGE.  Suspect RV > LV failure. Cardiac MRI is not suggestive of amyloidosis, no M-spike on myeloma panel. We have not ruled out CAD (cath deferred given lack of ischemic CP and CKD). Milrinone  started primarily for RV support during admission in 9/21 and titrated off. Rita [name] had cardiomems placed 1/22, goal has been 21 mmHg. Echo in 1/23 showed improved LV function, EF 55-60%, mild LVH, mild RV enlargement with normal systolic function, IVC dilated, PASP 68 mmHg. Admitted 7/23 with CHF exacerbation and AKI. Echo (7/23) showed EF 60-65%, interventricular septum flattened in systole and diastole, consistent w/ RV pressure and volume overload, RV mildly enlarged, RV function normal, severaly elevated PASP, LA severely dilated, trivial MR, severe TR. RHC showed mildly elevated right and left heart filling pressures, moderate mixed pulmonary venous/pulmonary arterial (likely due to OHS/OSA) HTN and preserved CO. Required  milrinone  for RV support. Echo 2/24 showed EF 50-55% with mild-moderate RV enlargement and mild RV dysfunction.  Echo 4/24 showed EF lower at 40-45%, RV low/normal. Rita [name] is now on HD. NYHA II today, volume managed by HD - Rita [name] is still making some urine per Rita [name] report and takes torsemide  100 mg every T, Th, Sat. Rita [name] can continue this as long as nephrology thinks there is utility.  - Continue Coreg  6.25 mg bid. - Not using Cardiomems with ESRD.  - Last echo 4/24 showed EF lower at 40-45% with evidence of volume overload. - Update echo. If EF remains low, consider R/LHC. If there is hope for renal recovery, will need to discuss further with Nephrology Rita [name] changes of coming off HD. 2. Chronic hypoxemic respiratory failure: OHS/OSA.  Rita [name] is now only using oxygen  prn.  Rita [name] has been having trouble with Rita [name] CPAP machine.   3. Atrial fibrillation: Chronic, all ECGs since 7/21 show atrial fibrillation.  Rita [name] is not on anticoagulation due to GI bleeding from small bowel AVMs (recurrent). Rate is controlled. No overt bleeding but required transfusion at 2/24 admission.  - Rita [name] cannot be cardioverted at this point as Rita [name] cannot be chronically anticoagulated. - Evaluated by Dr. Cindie for Watchman but not a suitable candidate given multiple severe medical comorbidities and risks associated with general anesthesia. 4. HTN: BP mildly elevated today, but generally well controlled at home. 5. Hx of GI bleeding: Recurrent, from small bowel AVMs. Unable to be anticoagulated.  - Rita [name] had transfusion last in 2/24.  6. H/o DVT/PE: Provoked (post-surgery). No longer on anticoagulation due to h/o significant GIB.  Has IVC filter.  7. ESRD: On HD since 2/24. Rita [name] is MWF, followed by Dr. Norine. 8. DM II: On insulin .  9. Anemia: Suspect due to chronic low grade GI bleeding (small bowel AVMs).   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 No focal  abnormality on this limited unenhanced exam. Denies ETOH use.  Hep A,B,C (-).  11. Thrombocytopenia: Chronic, mild.  May be due to cirrhosis.  12. Tricuspid regurgitation: Severe in past.  Repeat echo as above. 13. Pre-Operative CV risk  stratification: Rita [name] is at acceptable CV risk for vascular procedure. No chest pain, volume managed by HD. Will update echo. - RCRI score = 4 pts (10% risk of major cardia event)  Update echo. Follow up in 3 months with Dr. Rolan Raisin [facility] FNP-BC 08/31/2024

## 2024-08-31 NOTE — Patient Instructions (Addendum)
 Good to see you today!   Your physician has requested that you have an echocardiogram. Echocardiography is a painless test that uses sound waves to create images of your heart. It provides your doctor with information about the size and shape of your heart and how well your heart's chambers and valves are working. This procedure takes approximately one hour. There are no restrictions for this procedure. Please do NOT wear cologne, perfume, aftershave, or lotions (deodorant is allowed). Please arrive 15 minutes prior to your appointment time.  Please note: We ask at that you not bring children with you during ultrasound (echo/ vascular) testing. Due to room size and safety concerns, children are not allowed in the ultrasound rooms during exams. Our front office staff cannot provide observation of children in our lobby area while testing is being conducted. An adult accompanying a patient to their appointment will only be allowed in the ultrasound room at the discretion of the ultrasound technician under special circumstances. We apologize for any inconvenience.   Your physician recommends that you schedule a follow-up appointment 3 months  If you have any questions or concerns before your next appointment please send us  a message through [place] or call our office at [phone].    TO LEAVE A MESSAGE FOR THE NURSE SELECT OPTION 2, PLEASE LEAVE A MESSAGE INCLUDING: YOUR NAME DATE OF BIRTH CALL BACK NUMBER REASON FOR CALL**this is important as we prioritize the call backs  YOU WILL RECEIVE A CALL BACK THE SAME DAY AS LONG AS YOU CALL BEFORE 4:00 PM At the [facility], you and your health needs are our priority. As part of our continuing mission to provide you with exceptional heart care, we have created designated Provider Care Teams. These Care Teams include your primary Cardiologist (physician) and Advanced Practice Providers (APPs- Physician Assistants and Nurse Practitioners)  who all work together to provide you with the care you need, when you need it.   You may see any of the following providers on your designated Care Team at your next follow up: Dr Toribio Fuel Dr Ezra Shuck Dr. Ria Commander Dr. Morene Brownie Amy Lenetta, NP Caffie Shed, [place] [facility] [place], [place] Beckey Coe, NP [place] Lee, NP Ellouise Class, NP Tinnie Redman, PharmD Jaun Bash, PharmD   Please be sure to bring in all your medications bottles to every appointment.    Thank you for choosing  [facility]

## 2024-09-01 ENCOUNTER — Ambulatory Visit (HOSPITAL_COMMUNITY): Payer: Self-pay | Admitting: Family Medicine

## 2024-09-30 ENCOUNTER — Ambulatory Visit (HOSPITAL_COMMUNITY)
Admission: RE | Admit: 2024-09-30 | Discharge: 2024-09-30 | Disposition: A | Discharge status: Home / Self Care | Source: Ambulatory Visit | Attending: Family Medicine | Admitting: Family Medicine

## 2024-09-30 DIAGNOSIS — I3481 Nonrheumatic mitral (valve) annulus calcification: Secondary | ICD-10-CM | POA: Insufficient documentation

## 2024-09-30 DIAGNOSIS — I132 Hypertensive heart and chronic kidney disease with heart failure and with stage 5 chronic kidney disease, or end stage renal disease: Secondary | ICD-10-CM | POA: Insufficient documentation

## 2024-09-30 DIAGNOSIS — N186 End stage renal disease: Secondary | ICD-10-CM | POA: Insufficient documentation

## 2024-09-30 DIAGNOSIS — I5032 Chronic diastolic (congestive) heart failure: Secondary | ICD-10-CM | POA: Diagnosis present

## 2024-09-30 DIAGNOSIS — I358 Other nonrheumatic aortic valve disorders: Secondary | ICD-10-CM | POA: Insufficient documentation

## 2024-09-30 DIAGNOSIS — Z006 Encounter for examination for normal comparison and control in clinical research program: Secondary | ICD-10-CM

## 2024-09-30 LAB — ECHOCARDIOGRAM COMPLETE
Area-P 1/2: 3.85 cm2
Calc EF: 52.1 %
MV VTI: 2.11 cm2
S' Lateral: 2.7 cm
Single Plane A2C EF: 55.1 %
Single Plane A4C EF: 50.5 %

## 2024-09-30 NOTE — Research (Cosign Needed Addendum)
 SITE: [id number]     Subject # [id number]    Subprotocol: A  Inclusion Criteria  Patients who meet all of the following criteria are eligible for enrollment as study participants:  Yes No  Age > 70 years old X   Eligible to wear Holter Study X    Exclusion Criteria  Patients who meet any of these criteria are not eligible for enrollment as study participants: Yes No  1. Receiving any mechanical (respiratory or circulatory) or renal support therapy at Screening or during Visit #1.  X  2.  Any other conditions that in the opinion of the investigators are likely to prevent compliance with the study protocol or pose a safety concern if the subject participates in the study.  X  3. Poor tolerance, namely susceptible to severe skin allergies from ECG adhesive patch application.  X   Protocol: REV H    60 minute start window         Cor device must be applied, and the study initiated, no later than 60 minutes of completing the Echocardiogram                             HH:MM  Echo completion time  14:48  2.   Cor Study start time  14:46   30-Minute execution window  Once Cor Monitoring begins, 3 QT Med ECGs and the 15-minute rest period must be completed within a 30 minute window     HH:MM  3. QT Med ECG Completion time  14:52  4. Start of 15-Min sitting rest period  14:53  5. End of 15-Min rest period  15:10  6. Time of device removal  15:17   *Continue to use the Mobile App Event feature to log the Rest period windows and follow instructions on the EF-ACT Clinical Trial  Patient Instruction Card.  Describe any anomalies in Protocol execution in the Protocol Deviation Log    Residential Zip code [phone] (First 3 digits ONLY)                                           [facility] Informed Consent   Subject Name: Rita [name]  Subject met inclusion and exclusion criteria.  The informed consent form, study requirements and expectations were reviewed with the subject. Subject had  opportunity to read consent and questions and concerns were addressed prior to the signing of the consent form.  The subject verbalized understanding of the trial requirements.  The subject agreed to participate in the [facility] EF trial and signed the informed consent at 14:40 on 30-Sep-2024.  The informed consent was obtained prior to performance of any protocol-specific procedures for the subject.  A copy of the signed informed consent was given to the subject and a copy was placed in the subject's medical record.   Rita [name]          Current Outpatient Medications:    acetaminophen  (TYLENOL ) 325 MG tablet, Take 650 mg by mouth every 6 (six) hours as needed (pain.)., Disp: , Rfl:    allopurinol  (ZYLOPRIM ) 100 MG tablet, Take 100 mg by mouth 2 (two) times daily., Disp: , Rfl:    aspirin  EC 81 MG tablet, Take 81 mg by mouth daily after breakfast. Swallow whole., Disp: , Rfl:    Blood Glucose Monitoring Suppl (  FIFTY50 GLUCOSE METER 2.0) w/Device KIT, See admin instructions. (Patient not taking: Reported on 08/31/2024), Disp: , Rfl:    carvedilol  (COREG ) 6.25 MG tablet, TAKE 1 TABLET(6.25 MG) BY MOUTH TWICE DAILY, Disp: 180 tablet, Rfl: 1   Menthol-Methyl Salicylate (MUSCLE RUB) 10-15 % CREA, Apply 1 application topically as needed for muscle pain (knees). , Disp: , Rfl:    multivitamin (RENA-VIT) TABS tablet, Take 1 tablet by mouth daily. (Patient not taking: Reported on 08/31/2024), Disp: , Rfl:    OXYGEN , Inhale 2 L/min into the lungs at bedtime., Disp: , Rfl:    pantoprazole  (PROTONIX ) 40 MG tablet, Take 40 mg by mouth daily after breakfast., Disp: , Rfl:    polyethylene glycol (MIRALAX  / GLYCOLAX ) 17 g packet, Take 17 g by mouth daily as needed for mild constipation or moderate constipation., Disp: , Rfl:    sucroferric oxyhydroxide (VELPHORO) 500 MG chewable tablet, Chew 500 mg by mouth 3 (three) times daily with meals. Also with snacks, Disp: , Rfl:    torsemide  (DEMADEX ) 100 MG tablet,  Take 100 mg by mouth every Tuesday, Thursday, Saturday, and Sunday., Disp: , Rfl:

## 2024-10-04 ENCOUNTER — Telehealth: Payer: Self-pay

## 2024-10-04 NOTE — Telephone Encounter (Signed)
 Patient has had ECHO but states that she wants to wait until after she sees her cardiologist in December.  She will call [facility] for scheduling of L arm AVF revision vs AVG.  Paperwork has been sent to scanning.

## 2024-11-09 ENCOUNTER — Telehealth (HOSPITAL_COMMUNITY): Payer: Self-pay | Admitting: Cardiology

## 2024-11-09 NOTE — Telephone Encounter (Signed)
 Called to confirm/remind patient of their appointment at the [facility] on 11/09/24.   Appointment:   [x] Confirmed  [] Left mess   [] No answer/No voice mail  [] VM Full/unable to leave message  [] Phone not in service  Patient reminded to bring all medications and/or complete list.  Confirmed patient has transportation. Gave directions, instructed to utilize valet parking.

## 2024-11-10 ENCOUNTER — Ambulatory Visit (HOSPITAL_COMMUNITY): Payer: Self-pay | Admitting: Cardiology

## 2024-11-10 ENCOUNTER — Telehealth (HOSPITAL_COMMUNITY): Payer: Self-pay

## 2024-11-10 ENCOUNTER — Other Ambulatory Visit (HOSPITAL_COMMUNITY): Payer: Self-pay

## 2024-11-10 ENCOUNTER — Ambulatory Visit (HOSPITAL_COMMUNITY)
Admission: RE | Admit: 2024-11-10 | Discharge: 2024-11-10 | Disposition: A | Source: Ambulatory Visit | Attending: Cardiology | Admitting: Cardiology

## 2024-11-10 VITALS — BP 108/54 | HR 95 | Wt 180.4 lb

## 2024-11-10 DIAGNOSIS — I5032 Chronic diastolic (congestive) heart failure: Secondary | ICD-10-CM

## 2024-11-10 LAB — LIPID PANEL
Cholesterol: 161 mg/dL (ref 0–200)
HDL: 79 mg/dL (ref >40)
LDL Cholesterol: 70 mg/dL (ref 0–99)
Total CHOL/HDL Ratio: 2 ratio
Triglycerides: 60 mg/dL (ref <150)
VLDL: 12 mg/dL (ref 0–40)

## 2024-11-10 LAB — CBC
HCT: 29.8 % — ABNORMAL LOW (ref 36.0–46.0)
Hemoglobin: 9.8 g/dL — ABNORMAL LOW (ref 12.0–15.0)
MCH: 31.1 pg (ref 26.0–34.0)
MCHC: 32.9 g/dL (ref 30.0–36.0)
MCV: 94.6 fL (ref 80.0–100.0)
Platelets: 125 K/uL — ABNORMAL LOW (ref 150–400)
RBC: 3.15 MIL/uL — ABNORMAL LOW (ref 3.87–5.11)
RDW: 14.8 % (ref 11.5–15.5)
WBC: 5.9 K/uL (ref 4.0–10.5)
nRBC: 0 % (ref 0.0–0.2)

## 2024-11-10 MED ORDER — APIXABAN 5 MG PO TABS: 5.0000 mg | ORAL_TABLET | Freq: Two times a day (BID) | ORAL | 11 refills | Status: DC

## 2024-11-10 NOTE — Progress Notes (Signed)
 Medication Samples have been provided to the patient.  Drug name: Eliquis       Strength: 5 mg         Qty: 3 boxes  LOT: [id number]  Exp.Date: 02/2025  Dosing instructions: take 1 tablet Twice daily   The patient has been instructed regarding the correct time, dose, and frequency of taking this medication, including desired effects and most common side effects.   David Rodriquez M Malaka Ruffner 3:20 PM 11/10/2024

## 2024-11-10 NOTE — Patient Instructions (Addendum)
 STOP Asprin   START Eliquis  5 mg Twice daily  Labs done today, your results will be available in MyChart, we will contact you for abnormal readings.  Your physician recommends that you schedule a follow-up appointment in: 6 months. ( June 2026) ** PLEASE CALL THE OFFICE IN MARCH 2026 TO ARRANGE YOUR FOLLOW UP APPOINTMENT.**  If you have any questions or concerns before your next appointment please send us  a message through [place] or call our office at [phone].    TO LEAVE A MESSAGE FOR THE NURSE SELECT OPTION 2, PLEASE LEAVE A MESSAGE INCLUDING: YOUR NAME DATE OF BIRTH CALL BACK NUMBER REASON FOR CALL**this is important as we prioritize the call backs  YOU WILL RECEIVE A CALL BACK THE SAME DAY AS LONG AS YOU CALL BEFORE 4:00 PM  At the [facility], you and your health needs are our priority. As part of our continuing mission to provide you with exceptional heart care, we have created designated Provider Care Teams. These Care Teams include your primary Cardiologist (physician) and Advanced Practice Providers (APPs- Physician Assistants and Nurse Practitioners) who all work together to provide you with the care you need, when you need it.   You may see any of the following providers on your designated Care Team at your next follow up: Dr Toribio Fuel Dr Ezra Shuck Dr. Morene Brownie Greig Mosses, NP Caffie Shed, [place] [facility] [place], [place] Beckey Coe, NP Jordan Lee, NP Ellouise Class, NP Tinnie Redman, PharmD Jaun Bash, PharmD   Please be sure to bring in all your medications bottles to every appointment.    Thank you for choosing [facility]

## 2024-11-10 NOTE — Telephone Encounter (Signed)
 Advanced Heart Failure Patient Advocate Encounter  The patient was approved for a [employer] grant that will help cover the cost of Carvedilol , Eliquis.  Total amount awarded, $7,500.  Effective: 10/11/2024 - 10/10/2025.  BIN [id number] PCN [id number] Group [id number] ID [id number]  [employer] is requiring diagnosis verification before becoming active. Diagnosis verification uploaded to [employer] on 11/10/2024.  Rachel DEL, CPhT Rx Patient Advocate Phone: [phone]

## 2024-11-11 NOTE — Progress Notes (Signed)
 [facility] PCP: Dr Thurmond Nephrology: Dr. Jerrye HF Cardiology: Dr Rolan   Chief complaint: CHF  HPI: Rita [name] is a 68 y.o. 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    She has had multiple hospitalizations in the last year.  She was admitted January 2021 for COVID-19 pneumonia w/ acute hypoxic respiratory failure.  She did not require intubation.  She was treated with steroids and remdesivir .  She was readmitted twice in 7/21 for CHF exacerbations complicated by pleural effusion requiring IR guided thoracentesis.  She was readmitted again 8/21 for recurrent CHF and diuresed with IV Lasix .  Echo 8/21 showed moderately reduced systolic function, EF 35 to 40% with mild concentric left ventricular hypertrophy and grade 2 diastolic dysfunction, mild hypokinesis of the LV apical segment, RV systolic function was normal.    Readmitted recurrent CHF exacerbation plus AKI on CKD in 9/21.  Previous creatinine last admit was 1.76. Creatinine on this admission elevated at 2.06. Chest x-ray demonstrated cardiomegaly and pulmonary venous congestion with recurrent right-sided pleural effusion.  BNP 673.  CBC showed chronic anemia, hemoglobin 8.9.  Also with worsening thrombocytopenia.  Platelets down to 84K. Echo repeated with EF 40-45%, RV ok, PASP 51 mmHg. Placed on milrinone  to support RV for diuresis. Discharge weight 225 pounds. She was discharged on bidil  but too expensive. Changed to Imdur  + hydral. Cardiac MRI was done this admission, showed LV EF  41%, RV EF 27%, D-shaped septum, RV insertion site LGE (RV>LV failure), no evidence for cardiac amyloidosis.   She has seen EP for Watchman evaluation given inability to take anticoagulation.  Dr. Cindie thought that she is not a candidate for left atrial appendage occlusion given the risks associated with general anesthesia.  S/p Cardiomems 1/22.  Echo in 1/23 showed EF 55-60%, mild LVH, mild RV enlargement with normal systolic function, IVC dilated, PASP 68 mmHg.   Follow up 4/23, NYHA III, volume overloaded on exam. Torsemide  increased to 80/40. Eventually, metolazone  added back weekly.  Admitted 7/23 with CHF/massive volume overload. Diuresed with IV lasix , required milrinone  for RV support. Echo showed EF 60-65%, interventricular septum flattened in systole and diastole, consistent w/ RV pressure and volume overload, RV mildly enlarged, RV function normal. RHC showed mildly elevated right and left heart filling pressures, moderate mixed pulmonary venous/pulmonary arterial (likely due to OHS/OSA) HTN and preserved CO. IR consulted for R pleural effusion, s/p thoracentesis yielding 1.2 L. Nephrology consulted with worsening renal function, felt poor candidate for dialysis given RV failure. PMT consulted for GOC, outpatient referral to Palliative arranged. GDMT titrated but limited by CKD, discharged home, weight 178 lbs.   Patient was admitted to [facility] in 2/24 with CHF and PNA. She was anemic and received 1 unit PRBCs.  She had progressive renal failure and was started on HD this admission.  Echo in 2/24 showed EF 50-55% with mild-moderate RV enlargement and mild RV dysfunction.   S/p Left AVF placement 06/2023.  S/p revision of left AVF with sidebranch ligation x 2. S/p balloon angioplasty of left innominate vein and anastomosis.  Echo was done in 10/25,  this showed EF 50-55%, moderate LVH, mild RV enlargement with mildly decreased RV systolic function, PASP 29 mmHg, IVC normal.    Today she returns for HF follow up with her husband. Weight is stable.  Occasionally gets low BP with dialysis but generally seems to do ok.  She is taking torsemide  on non-HD days still but says that she does not urinate much.  She remains in permanent AF and is not anticoagulated.  She has had no BRBPR/melena recently.  No dyspnea with usual activities, she uses her walker for stability.  No chest pain.  She uses 2L [place] at night.  No orthopnea/PND.  She does not feel palpitations.   ECG (personally reviewed): atrial fibrillation, IVCD QRS 138 msec  Labs (2/24): LDL 45, LFTs normal, Hgb 9 Labs (1/25): K 4.2, creatinine 5.10   PMH: 1. Chronic systolic => diastolic CHF: She has prominent RV dysfunction.  - Echo 8/21 EF 35-40%, mod HK, G2DD, RV normal - Echo 9/21 EF 40-45%, global hypokinesis, D-shaped septum, at least mildly decreased RV systolic function.  - Cardiac MRI (9/21): LV EF 41%, diffuse hypokinesis, D-shaped septum, RV mildly dilated with RV EF 27%, nonspecific RV insertion site LGE.  No evidence for cardiac amyloidosis.  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  - RHC (10/21): mean RA 9, PA 54/17 mean 33, mean PCWP 20, CI 3.33, PVR 1.9 WU - Cardiomems placed 12/2020.  RHC (1/22) with mean RA 14, PA 60/22, mean PCWP 20, CI 3.06, PVR 3.2.  - Echo (1/23): EF 55-60%, mild LVH, mild RV enlargement with normal systolic function, IVC dilated, PASP 68 mmHg.  - Echo (7/23): EF 60-65%, interventricular septum flattened in systole and diastole, consistent w/ RV pressure and volume overload, RV mildly enlarged, RV function normal, severaly elevated PASP, LA severely dilated, trivial MR, severe TR. - RHC (7/23) mean RA 10, PA 57/21 mean 38, mean PCWP 22, CI 3.31, PVR 2.6 WU, PAPi 3.6 - Echo (2/24): EF 50-55% with mild-moderate RV enlargement and mild RV dysfunction - Echo (4/24): EF 40-45%, RV low/normal function, RVSP 64 mmhg, mild to moderate TR, IVC dilated. - Echo (10/25): EF 50-55%, moderate LVH, mild RV enlargement  with mildly decreased RV systolic function, PASP 29 mmHg, IVC normal.  2. ESRD on HD 3. Type 2 diabetes 4. Pleural effusion s/p thoracentesis (due to CHF) 5. HTN 6. 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  7. COVID-19 infection 8. Atrial fibrillation: Chronic.  Not anticoagulated due to chronic anemia and GI bleeding.  - Not Watchman candidate per EP.  9. Anemia: Suspect due to chronic low grade GI bleeding (small bowel AVMs).  10. OSA/OHS: Uses CPAP.  11. Left breast cancer: Treated remotely with chemo/radiation.  12. GERD 13. Chronic mild thrombocytopenia  ROS: All systems negative except as listed in HPI, PMH and Problem List.  Social History   Socioeconomic History   Marital status: Married    Spouse name: Not on file   Number of children: Not on file   Years of education: Not on file   Highest education level: Not on file  Occupational History   Not on file  Tobacco Use   Smoking status: Never   Smokeless tobacco: Never  Vaping Use   Vaping status: Never Used  Substance and Sexual Activity   Alcohol use: Never   Drug use: Never   Sexual activity: Not Currently    Birth control/protection: Post-menopausal  Other Topics Concern   Not on file  Social History Narrative   Not on file  Social Drivers of Corporate Investment Banker Strain: Not on file  Food Insecurity: Low Risk  (08/26/2023)   Received from [facility]   Hunger Vital Sign    Within the past 12 months, you worried that your food would run out before you got money to buy more: Never true    Within the past 12 months, the food you bought just didn't last and you didn't have money to get more. : Never true  Transportation Needs: No Transportation Needs (08/26/2023)   Received from [facility]    In the past 12 months, has lack of reliable transportation kept you from medical appointments, meetings, work or from getting things needed for daily living? : No  Physical Activity: Not on file   Stress: Not on file  Social Connections: Unknown (04/23/2022)   Received from [facility]   Social Network    Social Network: Not on file  Intimate Partner Violence: Unknown (03/15/2022)   Received from [facility]   HITS    Physically Hurt: Not on file    Insult or Talk Down To: Not on file    Threaten Physical Harm: Not on file    Scream or Curse: Not on file   Family History  Problem Relation Age of Onset   Cancer Mother    Current Outpatient Medications  Medication Sig Dispense Refill   acetaminophen  (TYLENOL ) 325 MG tablet Take 650 mg by mouth every 6 (six) hours as needed (pain.).     allopurinol  (ZYLOPRIM ) 100 MG tablet Take 100 mg by mouth 2 (two) times daily.     apixaban (ELIQUIS) 5 MG TABS tablet Take 1 tablet (5 mg total) by mouth 2 (two) times daily. 60 tablet 11   Blood Glucose Monitoring Suppl (FIFTY50 GLUCOSE METER 2.0) w/Device KIT See admin instructions.     carvedilol  (COREG ) 6.25 MG tablet TAKE 1 TABLET(6.25 MG) BY MOUTH TWICE DAILY 180 tablet 1   Menthol-Methyl Salicylate (MUSCLE RUB) 10-15 % CREA Apply 1 application topically as needed for muscle pain (knees).      OXYGEN  Inhale 2 L/min into the lungs at bedtime.     pantoprazole  (PROTONIX ) 40 MG tablet Take 40 mg by mouth daily after breakfast.     polyethylene glycol (MIRALAX  / GLYCOLAX ) 17 g packet Take 17 g by mouth daily as needed for mild constipation or moderate constipation.     sucroferric oxyhydroxide (VELPHORO) 500 MG chewable tablet Chew 500 mg by mouth 3 (three) times daily with meals. Also with snacks     torsemide  (DEMADEX ) 100 MG tablet Take 100 mg by mouth every Tuesday, Thursday, Saturday, and Sunday.     multivitamin (RENA-VIT) TABS tablet Take 1 tablet by mouth daily. (Patient not taking: Reported on 11/10/2024)     No current facility-administered medications for this encounter.   BP (!) 108/54   Pulse 95   Wt 81.8 kg (180 lb 6.4 oz)   SpO2 97%   BMI 34.09 kg/m   Wt Readings from  Last 3 Encounters:  11/10/24 81.8 kg (180 lb 6.4 oz)  08/31/24 81.7 kg (180 lb 3.2 oz)  08/30/24 76.2 kg (168 lb)   PHYSICAL EXAM: General: NAD Neck: No JVD, no thyromegaly or thyroid  nodule.  Lungs: Clear to auscultation bilaterally with normal respiratory effort. CV: Nondisplaced PMI.  Heart irregular S1/S2, no S3/S4, 1/6 SEM RUSB.  No peripheral edema.  No carotid bruit.  Normal pedal pulses.  Abdomen: Soft, nontender, no hepatosplenomegaly,  no distention.  Skin: Intact without lesions or rashes.  Neurologic: Alert and oriented x 3.  Psych: Normal affect. Extremities: No clubbing or cyanosis.  HEENT: Normal.   ASSESSMENT & PLAN: 1.  Chronic diastolic HF: Prominent RV failure.  9/21 echo showed EF 40-45%, global hypokinesis, D-shaped IV septum suggestive of RV pressure/volume overload, RV poorly visualized but appeared normal in size with at least mild systolic dysfunction. Cause of cardiomyopathy is uncertain.  She has history of COVID-19 1/21, cannot rule out viral myocarditis. Chemotherapy-mediated CMP is possible (not sure what chemo she had remotely for breast cancer).  Cardiac MRI showed LV EF 41% with diffuse hypokinesis, RV mildly dilated but with EF 27% and D-shaped septum, nonspecific RV insertion site LGE.  Suspect RV > LV failure. Cardiac MRI is not suggestive of amyloidosis, no M-spike on myeloma panel. We have not ruled out CAD (cath deferred given lack of ischemic CP and CKD). Milrinone  started primarily for RV support during admission in 9/21 and titrated off. She had cardiomems placed 1/22, goal has been 21 mmHg. Echo in 1/23 showed improved LV function, EF 55-60%, mild LVH, mild RV enlargement with normal systolic function, IVC dilated, PASP 68 mmHg. Admitted 7/23 with CHF exacerbation and AKI. Echo (7/23) showed EF 60-65%, interventricular septum flattened in systole and diastole, consistent w/ RV pressure and volume overload, RV mildly enlarged, RV function normal, severaly  elevated PASP, LA severely dilated, trivial MR, severe TR. RHC showed mildly elevated right and left heart filling pressures, moderate mixed pulmonary venous/pulmonary arterial (likely due to OHS/OSA) HTN and preserved CO. Required milrinone  for RV support. Echo 4/24 showed EF lower at 40-45%, RV low/normal. Echo in 10/25 showed EF 50-55%, moderate LVH, mild RV enlargement with mildly decreased RV systolic function, PASP 29 mmHg, IVC normal. Volume now managed by HD, NYHA class II.  - She takes torsemide  100 mg every T, Th, Sat. She can continue this as long as nephrology thinks there is utility but she says that she does not make much urine.  - Continue Coreg  6.25 mg bid. - Not using Cardiomems with ESRD.  2. Chronic hypoxemic respiratory failure: OHS/OSA.  She is now only using oxygen  at night now.  Does not tolerate CPAP.  3. Atrial fibrillation: Permanent, all ECGs since 7/21 show atrial fibrillation.  She has not been on anticoagulation due to GI bleeding from small bowel AVMs (recurrent). Rate is controlled. She has having no overt bleeding and has had no recent transfusions. - Evaluated by Dr. Cindie for Watchman but not a suitable candidate given multiple severe medical comorbidities and risks associated with general anesthesia. - Given high risk of CVA and no recent GI bleeding, I would like to try her back on Eliquis.  Stop ASA, start Eliquis 5 mg bid.  She will call with any signs of GI bleeding.  - CBC today and should follow periodically at dialysis center.   4. HTN: BP not elevated.  5. Hx of GI bleeding: Recurrent, from small bowel AVMs but not recent.  Not on anticoagulation.  Last transfusion was 2/24.  - As above, check CBC today and will stop ASA/start Eliquis.  6. H/o DVT/PE: Provoked (post-surgery). Has IVC filter.  7. ESRD: On HD since 2/24. She is MWF, followed by Dr. Norine. 8. DM II: On insulin .  9. Anemia: Suspect due to chronic low grade GI bleeding (small bowel AVMs).   -  Check CBC.  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 No focal abnormality on this limited unenhanced exam. Denies ETOH use.  Hep A,B,C (-).  11. Thrombocytopenia: Chronic, mild.  May be due to cirrhosis.  12. Tricuspid regurgitation: Severe in past but not significant on recent echoes.   Followup in 6 months.   I spent 31 minutes reviewing records, interviewing/examining patient, and managing orders.    Rita [name]  11/11/2024

## 2024-11-17 NOTE — Telephone Encounter (Signed)
 Diagnosis verification complete, grant is now active. Provided billing information to [facility], confirmed $0 copay. Patient mailbox is full, unable to leave message. Patient should receive approval letter and [employer]Well ID card by mail within 1-2 weeks.

## 2024-11-17 NOTE — Telephone Encounter (Signed)
 Diagnosis verification form re-uploaded to [employer]Well on 11/17/2024

## 2024-12-15 ENCOUNTER — Other Ambulatory Visit: Payer: Self-pay

## 2024-12-15 DIAGNOSIS — N186 End stage renal disease: Secondary | ICD-10-CM

## 2025-01-10 ENCOUNTER — Other Ambulatory Visit: Payer: Self-pay

## 2025-01-10 ENCOUNTER — Encounter (HOSPITAL_COMMUNITY): Payer: Self-pay | Admitting: Vascular Surgery

## 2025-01-10 NOTE — Progress Notes (Signed)
 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    Most recent echo 09/2024  withEF 50-55%, moderate LVH, mild RV enlargement with mildly decreased RV systolic function, PASP 29 mmHg, IVC normal.    Last seen by Dr. Rolan on 11/10/2024 and noted to be doing okay from cardiac standpoint. In persistent atrial fibrillation.  Continuing to use 2 L [place] at night.  She was off anticoagulation due to history of GIB, however, due to high risk for CVA, she was reinitiated on Eliquis .  No other changes to management, 81-month follow-up recommended.   Underwent left 1st stage fistula creation 06/19/23 without complication.   Other pertinent history includes GERD on PPI, chronic anemia, chronic thrombocytopenia, NIDDM2 left-sided breast cancer 30 years ago treated with surgery and chemoradiation.   Will need DOS labs and evaluation.   EKG 11/10/2024: Atrial fibrillation with a competing junctional pacemaker with premature ventricular or aberrantly conducted complexes.  Rate 75.  RAD.  Nonspecific intraventricular conduction block.   TTE 09/30/2024:  1. Left ventricular ejection fraction, by estimation, is 50 to 55%. The  left ventricle has low normal function. The left ventricle has no regional  wall motion abnormalities. There is moderate concentric left ventricular  hypertrophy. Left ventricular  diastolic parameters are indeterminate.   2. Right ventricular systolic function is mildly reduced. The right  ventricular size is mildly enlarged. There is normal pulmonary artery  systolic pressure. The  estimated right ventricular systolic pressure is  29.0 mmHg.   3. Left atrial size was moderately dilated.   4. Right atrial size was moderately dilated.   5. The mitral valve is degenerative. Trivial mitral valve regurgitation.  No evidence of mitral stenosis.   6. The aortic valve is tricuspid. Aortic valve regurgitation is trivial.  Aortic valve sclerosis is present, with no evidence of aortic valve  stenosis.   7. The inferior vena cava is normal in size with greater than 50%  respiratory variability, suggesting right atrial pressure of 3 mmHg.   Comparison(s): Changes from prior study are noted. 12/28/2021: LVEF 55-60%.      Lynwood Geofm RIGGERS [facility] Short Stay Center/Anesthesiology Phone [phone] 01/10/2025 4:47 PM

## 2025-01-12 ENCOUNTER — Ambulatory Visit (HOSPITAL_COMMUNITY)
Admission: RE | Admit: 2025-01-12 | Discharge: 2025-01-12 | Disposition: A | Source: Home / Self Care | Attending: Vascular Surgery | Admitting: Vascular Surgery

## 2025-01-12 ENCOUNTER — Encounter (HOSPITAL_COMMUNITY): Payer: Self-pay | Admitting: Physician Assistant

## 2025-01-12 ENCOUNTER — Encounter (HOSPITAL_COMMUNITY): Admission: RE | Disposition: A | Payer: Self-pay | Source: Home / Self Care | Attending: Vascular Surgery

## 2025-01-12 ENCOUNTER — Other Ambulatory Visit: Payer: Self-pay

## 2025-01-12 ENCOUNTER — Other Ambulatory Visit (HOSPITAL_COMMUNITY): Payer: Self-pay

## 2025-01-12 ENCOUNTER — Encounter (HOSPITAL_COMMUNITY): Payer: Self-pay | Admitting: Vascular Surgery

## 2025-01-12 DIAGNOSIS — N186 End stage renal disease: Secondary | ICD-10-CM | POA: Diagnosis not present

## 2025-01-12 DIAGNOSIS — T82858A Stenosis of vascular prosthetic devices, implants and grafts, initial encounter: Secondary | ICD-10-CM | POA: Diagnosis not present

## 2025-01-12 LAB — POCT I-STAT, CHEM 8
BUN: 28 mg/dL — ABNORMAL HIGH (ref 8–23)
Calcium, Ion: 1.01 mmol/L — ABNORMAL LOW (ref 1.15–1.40)
Chloride: 98 mmol/L (ref 98–111)
Creatinine, Ser: 8.9 mg/dL — ABNORMAL HIGH (ref 0.44–1.00)
Glucose, Bld: 126 mg/dL — ABNORMAL HIGH (ref 70–99)
HCT: 38 % (ref 36.0–46.0)
Hemoglobin: 12.9 g/dL (ref 12.0–15.0)
Potassium: 4.5 mmol/L (ref 3.5–5.1)
Sodium: 140 mmol/L (ref 135–145)
TCO2: 28 mmol/L (ref 22–32)

## 2025-01-12 LAB — GLUCOSE, CAPILLARY
Glucose-Capillary: 117 mg/dL — ABNORMAL HIGH (ref 70–99)
Glucose-Capillary: 164 mg/dL — ABNORMAL HIGH (ref 70–99)

## 2025-01-12 MED ORDER — PROPOFOL 1000 MG/100ML IV EMUL
INTRAVENOUS | Status: AC
  Filled 2025-01-12: qty 100

## 2025-01-12 MED ORDER — HYDROCODONE-ACETAMINOPHEN 5-325 MG PO TABS
1.0000 | ORAL_TABLET | Freq: Four times a day (QID) | ORAL | 0 refills | Status: AC | PRN
  Filled 2025-01-12: qty 12, 3d supply, fill #0

## 2025-01-12 MED ORDER — VASOPRESSIN 20 UNIT/ML IV SOLN
INTRAVENOUS | Status: AC
  Filled 2025-01-12: qty 1

## 2025-01-12 MED ORDER — PROTAMINE SULFATE 10 MG/ML IV SOLN
INTRAVENOUS | Status: DC | PRN
  Administered 2025-01-12: 20 mg via INTRAVENOUS

## 2025-01-12 MED ORDER — HEPARIN SODIUM (PORCINE) 1000 UNIT/ML IJ SOLN
INTRAMUSCULAR | Status: AC
  Filled 2025-01-12: qty 10

## 2025-01-12 MED ORDER — EPHEDRINE 5 MG/ML INJ
INTRAVENOUS | Status: AC
  Filled 2025-01-12: qty 5

## 2025-01-12 MED ORDER — PROTAMINE SULFATE 10 MG/ML IV SOLN
INTRAVENOUS | Status: AC
  Filled 2025-01-12: qty 25

## 2025-01-12 MED ORDER — CEFAZOLIN SODIUM-DEXTROSE 2-4 GM/100ML-% IV SOLN
2.0000 g | INTRAVENOUS | Status: AC
  Administered 2025-01-12: 2 g via INTRAVENOUS
  Filled 2025-01-12: qty 100

## 2025-01-12 MED ORDER — EPHEDRINE SULFATE-NACL 50-0.9 MG/10ML-% IV SOSY
PREFILLED_SYRINGE | INTRAVENOUS | Status: DC | PRN
  Administered 2025-01-12 (×2): 5 mg via INTRAVENOUS

## 2025-01-12 MED ORDER — CHLORHEXIDINE GLUCONATE 0.12 % MT SOLN
15.0000 mL | Freq: Once | OROMUCOSAL | Status: AC
  Administered 2025-01-12: 15 mL via OROMUCOSAL
  Filled 2025-01-12: qty 15

## 2025-01-12 MED ORDER — INSULIN ASPART 100 UNIT/ML IJ SOLN: 0.0000 [IU] | INTRAMUSCULAR | Status: DC | PRN

## 2025-01-12 MED ORDER — FENTANYL CITRATE (PF) 100 MCG/2ML IJ SOLN
INTRAMUSCULAR | Status: DC | PRN
  Administered 2025-01-12 (×2): 50 ug via INTRAVENOUS

## 2025-01-12 MED ORDER — PROPOFOL 10 MG/ML IV BOLUS
INTRAVENOUS | Status: AC
  Filled 2025-01-12: qty 20

## 2025-01-12 MED ORDER — FENTANYL CITRATE (PF) 100 MCG/2ML IJ SOLN: 25.0000 ug | INTRAMUSCULAR | Status: DC | PRN

## 2025-01-12 MED ORDER — CHLORHEXIDINE GLUCONATE 4 % EX SOLN: 60.0000 mL | Freq: Once | CUTANEOUS | Status: DC

## 2025-01-12 MED ORDER — 0.9 % SODIUM CHLORIDE (POUR BTL) OPTIME
TOPICAL | Status: DC | PRN
  Administered 2025-01-12: 1000 mL

## 2025-01-12 MED ORDER — HEMOSTATIC AGENTS (NO CHARGE) OPTIME
TOPICAL | Status: DC | PRN
  Administered 2025-01-12: 1 via TOPICAL

## 2025-01-12 MED ORDER — HEPARIN SODIUM (PORCINE) 1000 UNIT/ML IJ SOLN
INTRAMUSCULAR | Status: DC | PRN
  Administered 2025-01-12: 5000 [IU] via INTRAVENOUS

## 2025-01-12 MED ORDER — ORAL CARE MOUTH RINSE: 15.0000 mL | Freq: Once | OROMUCOSAL | Status: AC

## 2025-01-12 MED ORDER — ALBUMIN HUMAN 5 % IV SOLN: INTRAVENOUS | Status: DC | PRN

## 2025-01-12 MED ORDER — PHENYLEPHRINE 80 MCG/ML (10ML) SYRINGE FOR IV PUSH (FOR BLOOD PRESSURE SUPPORT)
PREFILLED_SYRINGE | INTRAVENOUS | Status: DC | PRN
  Administered 2025-01-12: 120 ug via INTRAVENOUS
  Administered 2025-01-12: 80 ug via INTRAVENOUS
  Administered 2025-01-12: 40 ug via INTRAVENOUS

## 2025-01-12 MED ORDER — PHENYLEPHRINE 80 MCG/ML (10ML) SYRINGE FOR IV PUSH (FOR BLOOD PRESSURE SUPPORT)
PREFILLED_SYRINGE | INTRAVENOUS | Status: AC
  Filled 2025-01-12: qty 10

## 2025-01-12 MED ORDER — PHENYLEPHRINE HCL-NACL 20-0.9 MG/250ML-% IV SOLN
INTRAVENOUS | Status: DC | PRN
  Administered 2025-01-12: 30 ug/min via INTRAVENOUS

## 2025-01-12 MED ORDER — PROPOFOL 500 MG/50ML IV EMUL
INTRAVENOUS | Status: DC | PRN
  Administered 2025-01-12: 75 ug/kg/min via INTRAVENOUS

## 2025-01-12 MED ORDER — ROPIVACAINE HCL 5 MG/ML IJ SOLN
INTRAMUSCULAR | Status: DC | PRN
  Administered 2025-01-12: 30 mL via PERINEURAL

## 2025-01-12 MED ORDER — PROPOFOL 10 MG/ML IV BOLUS
INTRAVENOUS | Status: DC | PRN
  Administered 2025-01-12: 30 mg via INTRAVENOUS

## 2025-01-12 MED ORDER — SODIUM CHLORIDE 0.9 % IV SOLN: INTRAVENOUS | Status: DC

## 2025-01-12 MED ORDER — FENTANYL CITRATE (PF) 100 MCG/2ML IJ SOLN
INTRAMUSCULAR | Status: AC
  Filled 2025-01-12: qty 2

## 2025-01-12 MED ORDER — VASOPRESSIN 20 UNIT/ML IV SOLN
INTRAVENOUS | Status: DC | PRN
  Administered 2025-01-12: 1 [IU] via INTRAVENOUS
  Administered 2025-01-12: .5 [IU] via INTRAVENOUS
  Administered 2025-01-12 (×2): 1 [IU] via INTRAVENOUS

## 2025-01-12 MED ORDER — HEPARIN 6000 UNIT IRRIGATION SOLUTION
Status: DC | PRN
  Administered 2025-01-12: 1

## 2025-01-12 MED ORDER — BUPIVACAINE HCL (PF) 0.25 % IJ SOLN
INTRAMUSCULAR | Status: DC | PRN
  Administered 2025-01-12: 10 mL

## 2025-01-12 MED ORDER — LIDOCAINE 2% (20 MG/ML) 5 ML SYRINGE
INTRAMUSCULAR | Status: DC | PRN
  Administered 2025-01-12: 20 mg via INTRAVENOUS

## 2025-01-12 NOTE — Anesthesia Procedure Notes (Signed)
 Anesthesia Regional Block: Supraclavicular block   Pre-Anesthetic Checklist: , timeout performed,  Correct Patient, Correct Site, Correct Laterality,  Correct Procedure, Correct Position, site marked,  Risks and benefits discussed,  Pre-op  evaluation,  At surgeon's request and post-op pain management  Laterality: Left  Prep: Maximum Sterile Barrier Precautions used, chloraprep       Needles:  Injection technique: Single-shot  Needle Type: Echogenic Stimulator Needle     Needle Length: 5cm  Needle Gauge: 22     Additional Needles:   Procedures:,,,, ultrasound used (permanent image in chart),,    Narrative:  Start time: 01/12/2025 8:03 AM End time: 01/12/2025 8:13 AM Injection made incrementally with aspirations every 5 mL.  Performed by: Personally  Anesthesiologist: Epifanio Fallow, MD  Additional Notes: Intercostobrachial block done with 10cc of 0.25% Bupiv plain.

## 2025-01-12 NOTE — Transfer of Care (Signed)
 Immediate Anesthesia Transfer of Care Note  Patient: Rita [name]  Procedure(s) Performed: REVISON OF BRACHIALCEPHALIC, ARTERIOVENOUS FISTULA (Left: Arm Upper)  Patient Location: PACU  Anesthesia Type:MAC  Level of Consciousness: drowsy and patient cooperative  Airway & Oxygen  Therapy: Patient Spontanous Breathing and Patient connected to face mask oxygen   Post-op Assessment: Report given to RN and Post -op Vital signs reviewed and stable  Post vital signs: stable  Last Vitals:  Vitals Value Taken Time  BP 109/43 01/12/25 10:00  Temp    Pulse 63 01/12/25 10:02  Resp 17 01/12/25 10:02  SpO2 99 % 01/12/25 10:02  Vitals shown include unfiled device data.  Last Pain:  Vitals:   01/12/25 0722  TempSrc:   PainSc: 0-No pain         Complications: No notable events documented.

## 2025-01-12 NOTE — Discharge Instructions (Signed)
" ° °  [facility]  Discharge Instructions  AV Fistula or Graft Surgery for Dialysis Access  Please refer to the following instructions for your post-procedure care. Your surgeon or physician assistant will discuss any changes with you.  Activity  You may drive the day following your surgery, if you are comfortable and no longer taking prescription pain medication. Resume full activity as the soreness in your incision resolves.  Bathing/Showering  You may shower after you go home. Keep your incision dry for 48 hours. Do not soak in a bathtub, hot tub, or swim until the incision heals completely. You may not shower if you have a hemodialysis catheter.  Incision Care  Clean your incision with mild soap and water after 48 hours. Pat the area dry with a clean towel. You do not need a bandage unless otherwise instructed. Do not apply any ointments or creams to your incision. You may have skin glue on your incision. Do not peel it off. It will come off on its own in about one week. Your arm may swell a bit after surgery. To reduce swelling use pillows to elevate your arm so it is above your heart. Your doctor will tell you if you need to lightly wrap your arm with an ACE bandage.  Diet  Resume your normal diet. There are not special food restrictions following this procedure. In order to heal from your surgery, it is CRITICAL to get adequate nutrition. Your body requires vitamins, minerals, and protein. Vegetables are the best source of vitamins and minerals. Vegetables also provide the perfect balance of protein. Processed food has little nutritional value, so try to avoid this.  Medications  Resume taking all of your medications. If your incision is causing pain, you may take over-the counter pain relievers such as acetaminophen  (Tylenol ). If you were prescribed a stronger pain medication, please be aware these medications can cause nausea and constipation. Prevent  nausea by taking the medication with a snack or meal. Avoid constipation by drinking plenty of fluids and eating foods with high amount of fiber, such as fruits, vegetables, and grains.  Do not take Tylenol  if you are taking prescription pain medications.  Follow up Your surgeon may want to see you in the office following your access surgery. If so, this will be arranged at the time of your surgery.  Please call us  immediately for any of the following conditions:  Increased pain, redness, drainage (pus) from your incision site Fever of 101 degrees or higher Severe or worsening pain at your incision site Hand pain or numbness.  Reduce your risk of vascular disease:  Stop smoking. If you would like help, call [facility] at 1-800-QUIT-NOW (951-248-3258) or [place] at 772-512-3774  Manage your cholesterol Maintain a desired weight Control your diabetes Keep your blood pressure down  Dialysis  It will take several weeks to several months for your new dialysis access to be ready for use. Your surgeon will determine when it is okay to use it. Your nephrologist will continue to direct your dialysis. You can continue to use your Permcath until your new access is ready for use.   01/12/2025 Rita [name] [id number] 1956/06/07  Surgeon(s): [name], MD  Procedures: REVISON OF LEFT BRACHIAL CEPHALIC, ARTERIOVENOUS FISTULA  x Do not stick fistula for 8 weeks    If you have any questions, please call the office at [phone].  "

## 2025-01-12 NOTE — H&P (Signed)
 "   Patient name: Rita [name]  MRN: [id number]        DOB: Apr 30, 1956            Sex: female   REASON FOR VISIT: new access   HPI: Rita [name] is a 69 y.o. female with history A-fib, diabetes, CHF, ESRD that presents for new access.  She had a prior left brachiocephalic AV fistula placed on 2/88/7975.  This underwent sidebranch ligation on 10/08/2023.  Recently had a fistulogram on 05/19/2024 with Dr. Pearline with a anastomotic stenosis.   Currently using a left IJ TDC.  States the fistula has not been working.  She would like to keep access in her left arm.         Past Medical History:  Diagnosis Date   Acute hypoxemic respiratory failure due to COVID-19 (HCC) 12/30/2019   Ambulates with cane      or walker   Anemia      Hx  Blood Transfusion in 04/2020 [facility]   Arthritis     Atrial fibrillation (HCC)     Cancer (HCC)      breast Cancer 35 yrs ago as of 06/17/23, no restrictions per patient for IV/Blood draws   Chronic respiratory failure with hypoxia, on home O2 therapy (HCC)      2L via [place] prn and qhs   CKD (chronic kidney disease), stage III (HCC)      dialysis M-W-F   COVID-19 12/30/2019    resolved   Diabetes mellitus without complication (HCC)      type 2 - diet controlled, no meds.  Patient does not check her CBGs.   Diastolic CHF (HCC)     Dyspnea      on oxygen  2L qhs via [place]   GI bleed      hx   Gout      hx - no current problems as of 06/17/23   Hypertension     Oxygen  dependent      qhs and prn - 2L via [place]   Pneumonia      x several many yrs ago per patient on 06/17/23   Sleep apnea      does not use CPAP, uses oxygen  2L via [place]   Thrombocytopenia (HCC)                 Past Surgical History:  Procedure Laterality Date   A/V FISTULAGRAM Left 10/02/2023    Procedure: A/V Fistulagram;  Surgeon: [name], MD;  Location: [facility] INVASIVE CV LAB;  Service: Cardiovascular;  Laterality: Left;   A/V FISTULAGRAM Left 12/18/2023     Procedure: A/V Fistulagram;  Surgeon: [name], MD;  Location: [facility] INVASIVE CV LAB;  Service: Cardiovascular;  Laterality: Left;   A/V SHUNT INTERVENTION N/A 05/19/2024    Procedure: A/V SHUNT INTERVENTION;  Surgeon: Pearline Norman RAMAN, MD;  Location: [facility] PV LAB;  Service: Cardiovascular;  Laterality: N/A;   AV FISTULA PLACEMENT Left 06/19/2023    Procedure: LEFT ARM BRACHIOCEPHALIC ARTERIOVENOUS (AV) FISTULA CREATION;  Surgeon: [name], MD;  Location: [facility] OR;  Service: Vascular;  Laterality: Left;   COLONOSCOPY   02/2020    in [facility]   HERNIA REPAIR   02/2019   IR THORACENTESIS ASP PLEURAL SPACE W/IMG GUIDE   06/28/2020   IR THORACENTESIS ASP PLEURAL SPACE W/IMG GUIDE   08/31/2020   IR THORACENTESIS ASP PLEURAL SPACE W/IMG GUIDE   06/27/2022   LIGATION OF ARTERIOVENOUS  FISTULA Left 10/08/2023    Procedure: SIDEBRANCH LIGATION;  Surgeon: [name], MD;  Location: [facility] OR;  Service: Vascular;  Laterality: Left;   PERIPHERAL VASCULAR BALLOON ANGIOPLASTY   12/18/2023    Procedure: PERIPHERAL VASCULAR BALLOON ANGIOPLASTY;  Surgeon: [name], MD;  Location: [facility] INVASIVE CV LAB;  Service: Cardiovascular;;   PRESSURE SENSOR/CARDIOMEMS N/A 12/26/2020    Procedure: PRESSURE SENSOR/CARDIOMEMS;  Surgeon: Rolan Ezra RAMAN, MD;  Location: [facility] INVASIVE CV LAB;  Service: Cardiovascular;  Laterality: N/A;   REVISON OF ARTERIOVENOUS FISTULA Left 10/08/2023    Procedure: LEFT ARM ARTERIOVENOUS FISTULA REVISION;  Surgeon: [name], MD;  Location: [facility] OR;  Service: Vascular;  Laterality: Left;   RIGHT HEART CATH N/A 09/07/2020    Procedure: RIGHT HEART CATH;  Surgeon: Rolan Ezra RAMAN, MD;  Location: [facility] INVASIVE CV LAB;  Service: Cardiovascular;  Laterality: N/A;   RIGHT HEART CATH N/A 07/02/2022    Procedure: RIGHT HEART CATH;  Surgeon: Rolan Ezra RAMAN, MD;  Location: [facility] INVASIVE CV LAB;  Service: Cardiovascular;  Laterality: N/A;   UPPER GI ENDOSCOPY   02/2020    in  [facility]   VENOUS ANGIOPLASTY   05/19/2024    Procedure: VENOUS ANGIOPLASTY;  Surgeon: Pearline Norman RAMAN, MD;  Location: [facility];  Service: Cardiovascular;;  innominate/VA               Family History  Problem Relation Age of Onset   Cancer Mother            SOCIAL HISTORY: Social History        Tobacco Use   Smoking status: Never   Smokeless tobacco: Never  Substance Use Topics   Alcohol use: Never      Allergies       Allergies  Allergen Reactions   Codeine Other (See Comments)      Mouth sores    Prednisone Other (See Comments)      Delusions    Propoxyphene Itching   Moxifloxacin Rash   Nsaids Rash              Current Outpatient Medications  Medication Sig Dispense Refill   acetaminophen  (TYLENOL ) 325 MG tablet Take 650 mg by mouth every 6 (six) hours as needed (pain.).       allopurinol  (ZYLOPRIM ) 100 MG tablet Take 100 mg by mouth 2 (two) times daily.       aspirin  EC 81 MG tablet Take 81 mg by mouth daily after breakfast. Swallow whole.       blood glucose meter kit and supplies Dispense based on patient and insurance preference. Use up to four times daily as directed. (FOR ICD-10 E10.9, E11.9). 1 each 0   Blood Glucose Monitoring Suppl (FIFTY50 GLUCOSE METER 2.0) w/Device KIT See admin instructions.       carvedilol  (COREG ) 6.25 MG tablet TAKE 1 TABLET(6.25 MG) BY MOUTH TWICE DAILY (Patient taking differently: Take 6.25 mg by mouth See admin instructions. Take 1 tablet (6.25 mg) by mouth on Tuesdays, Thursdays Saturdays, & Sundays twice a day.) 180 tablet 1   HYDROcodone -acetaminophen  (NORCO/VICODIN) 5-325 MG tablet Take 1 tablet by mouth every 4 (four) hours as needed for moderate pain (pain score 4-6) or severe pain (pain score 7-10). 7 tablet 0   Menthol-Methyl Salicylate (MUSCLE RUB) 10-15 % CREA Apply 1 application topically as needed for muscle pain (knees).        OXYGEN  Inhale 2 L/min into the lungs at bedtime.  pantoprazole  (PROTONIX ) 40 MG tablet  Take 40 mg by mouth daily after breakfast.       polyethylene glycol (MIRALAX  / GLYCOLAX ) 17 g packet Take 17 g by mouth daily as needed for mild constipation or moderate constipation.       torsemide  (DEMADEX ) 100 MG tablet Take 100 mg by mouth every Tuesday, Thursday, Saturday, and Sunday.          No current facility-administered medications for this visit.        REVIEW OF SYSTEMS:  [X]  denotes positive finding, [ ]  denotes negative finding Cardiac   Comments:  Chest pain or chest pressure:      Shortness of breath upon exertion:      Short of breath when lying flat:      Irregular heart rhythm:             Vascular      Pain in calf, thigh, or hip brought on by ambulation:      Pain in feet at night that wakes you up from your sleep:       Blood clot in your veins:      Leg swelling:              Pulmonary      Oxygen  at home:      Productive cough:       Wheezing:              Neurologic      Sudden weakness in arms or legs:       Sudden numbness in arms or legs:       Sudden onset of difficulty speaking or slurred speech:      Temporary loss of vision in one eye:       Problems with dizziness:              Gastrointestinal      Blood in stool:       Vomited blood:              Genitourinary      Burning when urinating:       Blood in urine:             Psychiatric      Major depression:              Hematologic      Bleeding problems:      Problems with blood clotting too easily:             Skin      Rashes or ulcers:             Constitutional      Fever or chills:          PHYSICAL EXAM:    Vitals:    06/29/24 1336  BP: (!) 92/53  Pulse: 66  Resp: 20  Temp: 97.9 F (36.6 C)  TempSrc: Temporal  SpO2: 99%  Weight: 180 lb 4.8 oz (81.8 kg)  Height: 5' 1 (1.549 m)      GENERAL: The patient is a well-nourished female, in no acute distress. The vital signs are documented above. CARDIAC: There is a regular rate and rhythm.  VASCULAR:  Left  brachiocephalic AV fistula with thrill Left radial pulse palpable Left IJ TDC PULMONARY: No respiratory distress. ABDOMEN: Soft and non-tender. MUSCULOSKELETAL: There are no major deformities or cyanosis. NEUROLOGIC: No focal weakness or paresthesias are detected. SKIN: There are no ulcers or rashes noted.  PSYCHIATRIC: The patient has a normal affect.   DATA:    UPPER EXTREMITY VEIN MAPPING  Patient Name:  Rita [name]  Date of Exam:   06/29/2024 Medical Rec #: [id number]                   Accession #:    [id number] Date of Birth: 02-13-1956                    Patient Gender: F Patient Age:   28 years Exam Location:  [address] Procedure:      VAS US  UPPER EXT VEIN MAPPING (PRE-OP  AVF) Referring Phys: LONNI GASKINS   --------------------------------------------------------------------------- -----   Indications: Pre-access.  Performing Technologist: Duwaine Hives RVS    Examination Guidelines: A complete evaluation includes B-mode imaging, spectral Doppler, color Doppler, and power Doppler as needed of all accessible portions of each vessel. Bilateral testing is considered an integral part of a complete examination. Limited examinations for reoccurring indications may be performed as noted.  +-----------------+-------------+----------+---------+ Right Cephalic   Diameter (cm)Depth (cm)Findings  +-----------------+-------------+----------+---------+ Shoulder             0.19                         +-----------------+-------------+----------+---------+ Prox upper arm       0.22                         +-----------------+-------------+----------+---------+ Mid upper arm        0.28                         +-----------------+-------------+----------+---------+ Dist upper arm       0.31                         +-----------------+-------------+----------+---------+ Antecubital fossa    0.41                branching +-----------------+-------------+----------+---------+ Prox forearm         0.21                         +-----------------+-------------+----------+---------+ Mid forearm          0.25                         +-----------------+-------------+----------+---------+ Dist forearm         0.17                         +-----------------+-------------+----------+---------+ Wrist                0.16                         +-----------------+-------------+----------+---------+  +-----------------+-------------+----------+--------+ Right Basilic    Diameter (cm)Depth (cm)Findings +-----------------+-------------+----------+--------+ Mid upper arm        0.21                        +-----------------+-------------+----------+--------+ Dist upper arm       0.15                        +-----------------+-------------+----------+--------+ Antecubital fossa    0.14                        +-----------------+-------------+----------+--------+  Prox forearm         0.20                        +-----------------+-------------+----------+--------+ Mid forearm          0.16                        +-----------------+-------------+----------+--------+  +-----------------+-------------+----------+--------+ Left Cephalic    Diameter (cm)Depth (cm)Findings +-----------------+-------------+----------+--------+ Shoulder                                  avf    +-----------------+-------------+----------+--------+ Prox upper arm                            avf    +-----------------+-------------+----------+--------+ Mid upper arm                             avf    +-----------------+-------------+----------+--------+ Dist upper arm                            avf    +-----------------+-------------+----------+--------+ Antecubital fossa                         avf     +-----------------+-------------+----------+--------+ Prox forearm         0.15                        +-----------------+-------------+----------+--------+ Mid forearm          0.16                        +-----------------+-------------+----------+--------+ Dist forearm         0.13                        +-----------------+-------------+----------+--------+ Wrist                0.07                        +-----------------+-------------+----------+--------+  +-----------------+-------------+----------+--------+ Left Basilic     Diameter (cm)Depth (cm)Findings +-----------------+-------------+----------+--------+ Dist upper arm       0.30                        +-----------------+-------------+----------+--------+ Antecubital fossa    0.19                        +-----------------+-------------+----------+--------+ Prox forearm         0.17                        +-----------------+-------------+----------+--------+  *See table(s) above for measurements and observations.     Diagnosing physician: Lonni Gaskins MD Electronically signed by Lonni Gaskins MD on 06/29/2024 at 1:47:11 PM.      Assessment/Plan:   69 y.o. female with history A-fib, diabetes, CHF, ESRD that presents for new access.  She has a prior left brachiocephalic AV fistula placed on 2/88/7975.  This underwent sidebranch ligation on 10/08/2023.  Recently had a fistulogram on 05/19/2024 with Dr. Pearline with anastomotic stenosis.  I have recommended surgical revision versus new left upper arm access likely an AV graft.  I reviewed her recent fistulogram pictures and the biggest issue appears to be a sclerotic and stenotic cephalic vein adjacent to the artery anastomosis.  Discussed we could likely revise this in the operating room and potentially create a new anastomosis or jump graft versus new upper arm graft.  All questions answered.     Lonni DOROTHA Gaskins,  MD [facility] Office: [phone] "

## 2025-01-12 NOTE — Op Note (Signed)
 Date: January 12, 2025  Preoperative diagnosis: End-stage renal disease with slow to mature left brachiocephalic AV fistula  Postoperative diagnosis: Same  Procedure: Left brachiocephalic AV fistula revision with new arterial anastomosis to the brachial artery  Surgeon: Dr. Lonni DOROTHA Gaskins, MD  Assistant: Lucie Apt, PA  Indications: 69 year old female with end-stage renal disease previously underwent a left brachiocephalic AV fistula that has also undergone sidebranch ligation and superficialization.  This has failed to mature.  Fistulogram images have shown high-grade stenosis at the arterial anastomosis.  She presents today for left arm revision versus new access placement after risk benefits discussed.  An assistant was needed to mobilize the cephalic vein and create a new arterial anastomosis to the  brachial artery.  Findings: The cephalic vein was of excellent caliber all the way up the arm and I elected to do a new arterial anastomosis since there was a stenosis noted on fistulogram at the previous anastomosis.  I made two longitudinal incisions above the antecubitum in the left arm and one was to get out the brachial artery and the other to mobilize the cephalic vein where it was healthy appearing.  I then transposed this under the skin to the brachial artery and a new end to side anastomosis was created to the brachial artery.  Great thrill at completion.  Palpable radial pulse.  Anesthesia: Regional  Details: Patient was taken to the operating room after informed consent was obtained.  Placed on the operative table in supine position.  She did get a regional block.  The left arm was then prepped and draped in standard sterile fashion.  Antibiotics were given and timeout performed.  I did check the block to make sure it was working.  We then used ultrasound and evaluated the brachial artery and the cephalic vein of the brachiocephalic fistula above the antecubitum.  The cephalic  vein looked to be of excellent caliber except for the stenotic area at the arterial anastomosis previously noted.  I then made two longitudinal incisions with one over the brachial artery and the other over the cephalic vein in the distal upper arm.  I then used Bovie cautery and fully mobilized the brachial artery more proximally from the previous anastomosis and placed Vesseloops for proximal distal control.  I then went to my other incision and mobilized the cephalic vein where it was healthy appearing and we mobilized this all the way down to the old arterial anastomosis where then ligated this over a fistula clamp after the vein was marked.  The cephalic vein at the arterial anastomosis was oversewn with 5-0 Prolene running anastomosis with good hemostasis.  I then tunneled this through the subcutaneous tissue to the brachial artery.  The patient was given 5000 units of IV heparin .  We then used Vesseloops to clamp the brachial artery and opened the brachial artery with 11 blade scalpel Potts scissors.  I then flushed the cephalic vein with heparinized saline and created a new end to side anastomosis 6-0 Prolene patient technique with the help of my assistant.  This was de-aired prior to completion.  Once we got a clamp we had an excellent thrill in the fistula and a palpable radial pulse.  I then freed up some of the subcutaneous tissue around the new anastomosis.  We got hemostasis.  The two incisions were closed with 3-0 Vicryl and 4-0 Monocryl and dermabond.    Complication: None  Condition: Stable  Lonni DOROTHA Gaskins, MD [facility]  Office: [phone]   [name]

## 2025-01-12 NOTE — Anesthesia Postprocedure Evaluation (Signed)
"   Anesthesia Post Note  Patient: Rita [name]  Procedure(s) Performed: REVISON OF BRACHIALCEPHALIC, ARTERIOVENOUS FISTULA (Left: Arm Upper)     Patient location during evaluation: PACU Anesthesia Type: Regional and MAC Level of consciousness: awake and alert Pain management: pain level controlled Vital Signs Assessment: post-procedure vital signs reviewed and stable Respiratory status: spontaneous breathing, nonlabored ventilation and respiratory function stable Cardiovascular status: stable and blood pressure returned to baseline Postop Assessment: no apparent nausea or vomiting Anesthetic complications: no   No notable events documented.  Last Vitals:  Vitals:   01/12/25 1000 01/12/25 1015  BP: (!) 99/58 (!) 98/51  Pulse: 64 65  Resp: 19 10  Temp: 36.4 C 36.4 C  SpO2: 100% 97%    Last Pain:  Vitals:   01/12/25 1015  TempSrc:   PainSc: 0-No pain                 Roldan Laforest,W. EDMOND      "

## 2025-01-13 ENCOUNTER — Encounter (HOSPITAL_COMMUNITY): Payer: Self-pay | Admitting: Vascular Surgery

## 2025-02-24 ENCOUNTER — Ambulatory Visit (HOSPITAL_COMMUNITY)

## 2025-02-24 ENCOUNTER — Encounter
